# Patient Record
Sex: Male | Born: 1960
Health system: Southern US, Community
[De-identification: ages and names within clinical notes are randomized; demographics above are authoritative.]

## PROBLEM LIST (undated history)

## (undated) DIAGNOSIS — E785 Hyperlipidemia, unspecified: Secondary | ICD-10-CM

## (undated) DIAGNOSIS — K635 Polyp of colon: Secondary | ICD-10-CM

## (undated) DIAGNOSIS — I714 Abdominal aortic aneurysm, without rupture, unspecified: Secondary | ICD-10-CM

## (undated) DIAGNOSIS — K760 Fatty (change of) liver, not elsewhere classified: Secondary | ICD-10-CM

## (undated) DIAGNOSIS — Z720 Tobacco use: Secondary | ICD-10-CM

## (undated) DIAGNOSIS — J42 Unspecified chronic bronchitis: Secondary | ICD-10-CM

## (undated) DIAGNOSIS — J449 Chronic obstructive pulmonary disease, unspecified: Secondary | ICD-10-CM

## (undated) DIAGNOSIS — I10 Essential (primary) hypertension: Secondary | ICD-10-CM

## (undated) DIAGNOSIS — I5032 Chronic diastolic (congestive) heart failure: Secondary | ICD-10-CM

## (undated) DIAGNOSIS — K219 Gastro-esophageal reflux disease without esophagitis: Secondary | ICD-10-CM

## (undated) DIAGNOSIS — I209 Angina pectoris, unspecified: Secondary | ICD-10-CM

## (undated) DIAGNOSIS — I2510 Atherosclerotic heart disease of native coronary artery without angina pectoris: Secondary | ICD-10-CM

## (undated) DIAGNOSIS — K209 Esophagitis, unspecified without bleeding: Secondary | ICD-10-CM

## (undated) DIAGNOSIS — D126 Benign neoplasm of colon, unspecified: Secondary | ICD-10-CM

## (undated) DIAGNOSIS — G4730 Sleep apnea, unspecified: Secondary | ICD-10-CM

## (undated) DIAGNOSIS — K449 Diaphragmatic hernia without obstruction or gangrene: Secondary | ICD-10-CM

## (undated) DIAGNOSIS — F419 Anxiety disorder, unspecified: Secondary | ICD-10-CM

## (undated) DIAGNOSIS — F1490 Cocaine use, unspecified, uncomplicated: Secondary | ICD-10-CM

## (undated) DIAGNOSIS — I5030 Unspecified diastolic (congestive) heart failure: Secondary | ICD-10-CM

## (undated) DIAGNOSIS — I219 Acute myocardial infarction, unspecified: Secondary | ICD-10-CM

## (undated) DIAGNOSIS — K859 Acute pancreatitis without necrosis or infection, unspecified: Secondary | ICD-10-CM

## (undated) DIAGNOSIS — R269 Unspecified abnormalities of gait and mobility: Secondary | ICD-10-CM

## (undated) DIAGNOSIS — F32A Depression, unspecified: Secondary | ICD-10-CM

## (undated) DIAGNOSIS — Z9289 Personal history of other medical treatment: Secondary | ICD-10-CM

## (undated) DIAGNOSIS — D509 Iron deficiency anemia, unspecified: Secondary | ICD-10-CM

## (undated) DIAGNOSIS — I739 Peripheral vascular disease, unspecified: Secondary | ICD-10-CM

## (undated) DIAGNOSIS — F329 Major depressive disorder, single episode, unspecified: Secondary | ICD-10-CM

## (undated) DIAGNOSIS — T8182XA Emphysema (subcutaneous) resulting from a procedure, initial encounter: Secondary | ICD-10-CM

## (undated) DIAGNOSIS — F1010 Alcohol abuse, uncomplicated: Secondary | ICD-10-CM

## (undated) DIAGNOSIS — I639 Cerebral infarction, unspecified: Secondary | ICD-10-CM

## (undated) DIAGNOSIS — H53461 Homonymous bilateral field defects, right side: Secondary | ICD-10-CM

## (undated) HISTORY — DX: Personal history of other medical treatment: Z92.89

## (undated) HISTORY — DX: Abdominal aortic aneurysm, without rupture, unspecified: I71.40

## (undated) HISTORY — DX: Fatty (change of) liver, not elsewhere classified: K76.0

## (undated) HISTORY — DX: Essential (primary) hypertension: I10

## (undated) HISTORY — PX: CORONARY ARTERY BYPASS GRAFT: SHX141

## (undated) HISTORY — PX: CORONARY ANGIOPLASTY: SHX604

## (undated) HISTORY — DX: Sleep apnea, unspecified: G47.30

## (undated) HISTORY — DX: Cocaine use, unspecified, uncomplicated: F14.90

## (undated) HISTORY — DX: Unspecified diastolic (congestive) heart failure: I50.30

## (undated) HISTORY — DX: Unspecified abnormalities of gait and mobility: R26.9

## (undated) HISTORY — DX: Acute pancreatitis without necrosis or infection, unspecified: K85.90

## (undated) HISTORY — PX: COLONOSCOPY: SHX174

## (undated) HISTORY — DX: Gastro-esophageal reflux disease without esophagitis: K21.9

## (undated) HISTORY — DX: Benign neoplasm of colon, unspecified: D12.6

## (undated) HISTORY — DX: Diaphragmatic hernia without obstruction or gangrene: K44.9

## (undated) HISTORY — DX: Acute myocardial infarction, unspecified: I21.9

## (undated) HISTORY — DX: Hyperlipidemia, unspecified: E78.5

---

## 2000-08-10 DIAGNOSIS — I2510 Atherosclerotic heart disease of native coronary artery without angina pectoris: Secondary | ICD-10-CM

## 2000-12-07 ENCOUNTER — Inpatient Hospital Stay (HOSPITAL_COMMUNITY): Admission: EM | Admit: 2000-12-07 | Discharge: 2000-12-10 | Payer: Self-pay | Admitting: Emergency Medicine

## 2000-12-07 DIAGNOSIS — I219 Acute myocardial infarction, unspecified: Secondary | ICD-10-CM

## 2000-12-07 HISTORY — DX: Acute myocardial infarction, unspecified: I21.9

## 2000-12-09 ENCOUNTER — Encounter: Payer: Self-pay | Admitting: Cardiology

## 2001-11-14 ENCOUNTER — Encounter: Payer: Self-pay | Admitting: Emergency Medicine

## 2001-11-14 ENCOUNTER — Inpatient Hospital Stay (HOSPITAL_COMMUNITY): Admission: EM | Admit: 2001-11-14 | Discharge: 2001-11-16 | Payer: Self-pay | Admitting: Emergency Medicine

## 2001-12-25 ENCOUNTER — Emergency Department (HOSPITAL_COMMUNITY): Admission: EM | Admit: 2001-12-25 | Discharge: 2001-12-25 | Payer: Self-pay | Admitting: Emergency Medicine

## 2001-12-25 ENCOUNTER — Encounter: Payer: Self-pay | Admitting: Emergency Medicine

## 2006-11-21 ENCOUNTER — Inpatient Hospital Stay (HOSPITAL_COMMUNITY): Admission: EM | Admit: 2006-11-21 | Discharge: 2006-11-24 | Payer: Self-pay | Admitting: Emergency Medicine

## 2006-12-04 ENCOUNTER — Ambulatory Visit (HOSPITAL_COMMUNITY): Admission: RE | Admit: 2006-12-04 | Discharge: 2006-12-04 | Payer: Self-pay | Admitting: Cardiology

## 2006-12-04 ENCOUNTER — Inpatient Hospital Stay (HOSPITAL_COMMUNITY): Admission: EM | Admit: 2006-12-04 | Discharge: 2006-12-06 | Payer: Self-pay | Admitting: Emergency Medicine

## 2007-12-04 ENCOUNTER — Encounter (INDEPENDENT_AMBULATORY_CARE_PROVIDER_SITE_OTHER): Payer: Self-pay | Admitting: Nurse Practitioner

## 2008-07-26 ENCOUNTER — Ambulatory Visit: Payer: Self-pay | Admitting: Nurse Practitioner

## 2008-07-26 DIAGNOSIS — E785 Hyperlipidemia, unspecified: Secondary | ICD-10-CM

## 2008-07-26 DIAGNOSIS — I10 Essential (primary) hypertension: Secondary | ICD-10-CM | POA: Insufficient documentation

## 2008-07-26 DIAGNOSIS — I2121 ST elevation (STEMI) myocardial infarction involving left circumflex coronary artery: Secondary | ICD-10-CM

## 2008-07-26 LAB — CONVERTED CEMR LAB
Cholesterol, target level: 200 mg/dL
LDL Goal: 70 mg/dL

## 2008-07-27 DIAGNOSIS — F17210 Nicotine dependence, cigarettes, uncomplicated: Secondary | ICD-10-CM | POA: Insufficient documentation

## 2008-07-27 DIAGNOSIS — F17200 Nicotine dependence, unspecified, uncomplicated: Secondary | ICD-10-CM

## 2008-07-27 LAB — CONVERTED CEMR LAB
Albumin: 4.5 g/dL (ref 3.5–5.2)
Alkaline Phosphatase: 76 units/L (ref 39–117)
BUN: 11 mg/dL (ref 6–23)
Basophils Relative: 0 % (ref 0–1)
CO2: 20 meq/L (ref 19–32)
Calcium: 9.3 mg/dL (ref 8.4–10.5)
Chloride: 102 meq/L (ref 96–112)
Creatinine, Ser: 0.8 mg/dL (ref 0.40–1.50)
Glucose, Bld: 111 mg/dL — ABNORMAL HIGH (ref 70–99)
HDL: 60 mg/dL (ref >39)
LDL Cholesterol: 157 mg/dL — ABNORMAL HIGH (ref 0–99)
MCV: 100.9 fL — ABNORMAL HIGH (ref 78.0–100.0)
Monocytes Relative: 10 % (ref 3–12)
Potassium: 4.1 meq/L (ref 3.5–5.3)
Total CHOL/HDL Ratio: 4.2
VLDL: 35 mg/dL (ref 0–40)

## 2008-07-31 ENCOUNTER — Encounter (INDEPENDENT_AMBULATORY_CARE_PROVIDER_SITE_OTHER): Payer: Self-pay | Admitting: Nurse Practitioner

## 2008-08-06 ENCOUNTER — Encounter (INDEPENDENT_AMBULATORY_CARE_PROVIDER_SITE_OTHER): Payer: Self-pay | Admitting: Nurse Practitioner

## 2008-08-06 DIAGNOSIS — R269 Unspecified abnormalities of gait and mobility: Secondary | ICD-10-CM

## 2008-08-07 ENCOUNTER — Encounter (INDEPENDENT_AMBULATORY_CARE_PROVIDER_SITE_OTHER): Payer: Self-pay | Admitting: Nurse Practitioner

## 2008-08-10 DIAGNOSIS — D509 Iron deficiency anemia, unspecified: Secondary | ICD-10-CM

## 2008-08-10 HISTORY — DX: Iron deficiency anemia, unspecified: D50.9

## 2008-08-13 ENCOUNTER — Encounter (INDEPENDENT_AMBULATORY_CARE_PROVIDER_SITE_OTHER): Payer: Self-pay | Admitting: Nurse Practitioner

## 2008-10-08 DIAGNOSIS — T8182XA Emphysema (subcutaneous) resulting from a procedure, initial encounter: Secondary | ICD-10-CM

## 2008-10-08 HISTORY — DX: Emphysema (subcutaneous) resulting from a procedure, initial encounter: T81.82XA

## 2008-11-07 ENCOUNTER — Encounter: Payer: Self-pay | Admitting: Cardiothoracic Surgery

## 2008-11-07 ENCOUNTER — Inpatient Hospital Stay (HOSPITAL_COMMUNITY): Admission: EM | Admit: 2008-11-07 | Discharge: 2008-11-11 | Payer: Self-pay | Admitting: Emergency Medicine

## 2008-11-07 ENCOUNTER — Ambulatory Visit: Payer: Self-pay | Admitting: Cardiovascular Disease

## 2008-11-07 ENCOUNTER — Ambulatory Visit: Payer: Self-pay | Admitting: Cardiothoracic Surgery

## 2008-11-07 HISTORY — PX: CARDIAC CATHETERIZATION: SHX172

## 2008-11-13 ENCOUNTER — Encounter: Payer: Self-pay | Admitting: Cardiovascular Disease

## 2008-12-06 ENCOUNTER — Encounter: Payer: Self-pay | Admitting: Cardiovascular Disease

## 2008-12-06 ENCOUNTER — Ambulatory Visit: Payer: Self-pay | Admitting: Cardiothoracic Surgery

## 2008-12-06 ENCOUNTER — Encounter: Admission: RE | Admit: 2008-12-06 | Discharge: 2008-12-06 | Payer: Self-pay | Admitting: Cardiothoracic Surgery

## 2008-12-25 ENCOUNTER — Telehealth: Payer: Self-pay | Admitting: Cardiovascular Disease

## 2009-01-21 ENCOUNTER — Encounter: Payer: Self-pay | Admitting: Cardiovascular Disease

## 2009-02-04 ENCOUNTER — Encounter: Payer: Self-pay | Admitting: Cardiovascular Disease

## 2009-02-28 ENCOUNTER — Encounter (INDEPENDENT_AMBULATORY_CARE_PROVIDER_SITE_OTHER): Payer: Self-pay | Admitting: *Deleted

## 2009-04-19 ENCOUNTER — Telehealth: Payer: Self-pay | Admitting: Cardiovascular Disease

## 2009-06-18 ENCOUNTER — Telehealth: Payer: Self-pay | Admitting: Cardiovascular Disease

## 2009-06-23 ENCOUNTER — Inpatient Hospital Stay (HOSPITAL_COMMUNITY): Admission: EM | Admit: 2009-06-23 | Discharge: 2009-06-24 | Payer: Self-pay | Admitting: Emergency Medicine

## 2009-06-23 ENCOUNTER — Ambulatory Visit: Payer: Self-pay | Admitting: Cardiology

## 2009-06-23 LAB — CONVERTED CEMR LAB
LDL Cholesterol: 119 mg/dL
Total CHOL/HDL Ratio: 4.1
Triglycerides: 55 mg/dL
VLDL: 11 mg/dL

## 2009-06-24 ENCOUNTER — Telehealth (INDEPENDENT_AMBULATORY_CARE_PROVIDER_SITE_OTHER): Payer: Self-pay | Admitting: *Deleted

## 2009-07-08 ENCOUNTER — Telehealth: Payer: Self-pay | Admitting: Cardiovascular Disease

## 2009-07-09 ENCOUNTER — Encounter: Payer: Self-pay | Admitting: Cardiovascular Disease

## 2009-07-16 ENCOUNTER — Ambulatory Visit: Payer: Self-pay | Admitting: Cardiovascular Disease

## 2009-07-17 ENCOUNTER — Telehealth (INDEPENDENT_AMBULATORY_CARE_PROVIDER_SITE_OTHER): Payer: Self-pay | Admitting: *Deleted

## 2009-09-02 ENCOUNTER — Telehealth: Payer: Self-pay | Admitting: Cardiovascular Disease

## 2009-09-02 IMAGING — CR DG CHEST 2V
2 series · 2 of 2 positions shown · non-contrast
Comparison: 11/10/2008.

CLINICAL DATA: Status post CABG.  Chest pain.

CHEST - 2 VIEW

[view not recorded (1 of 2)]
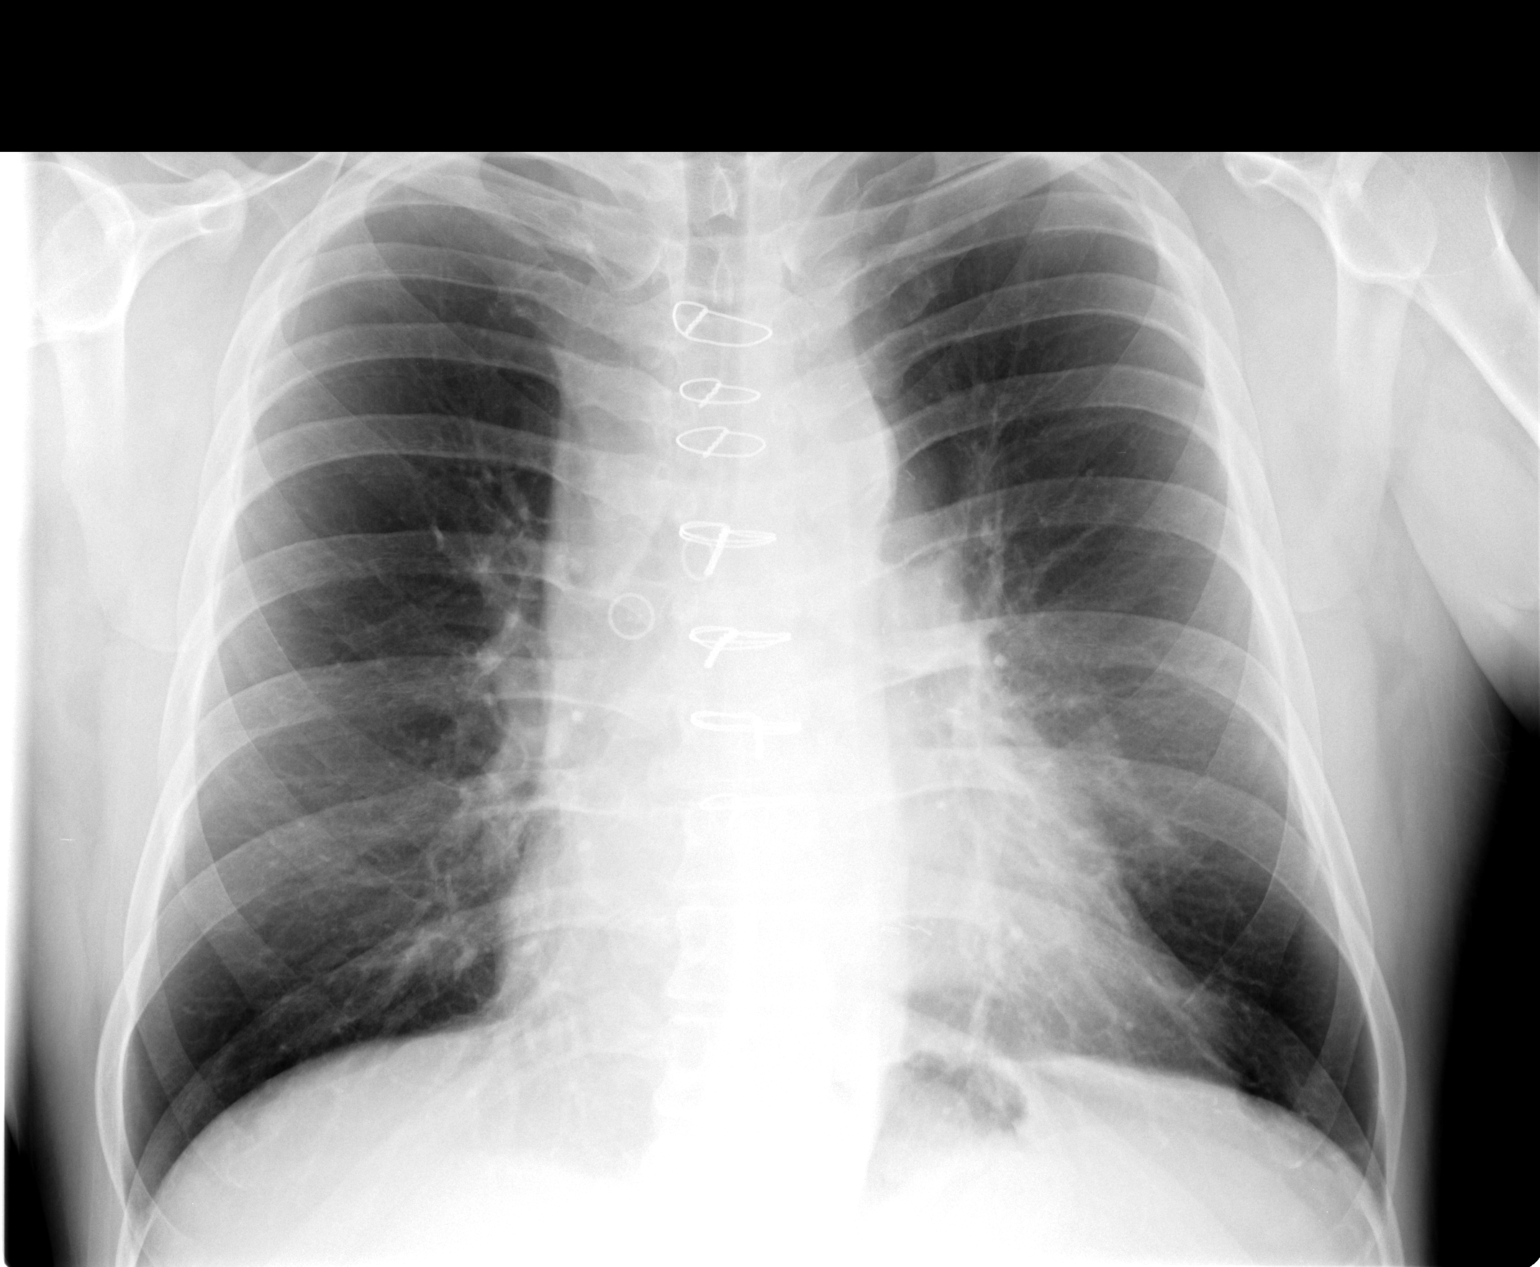

[view not recorded (2 of 2)]
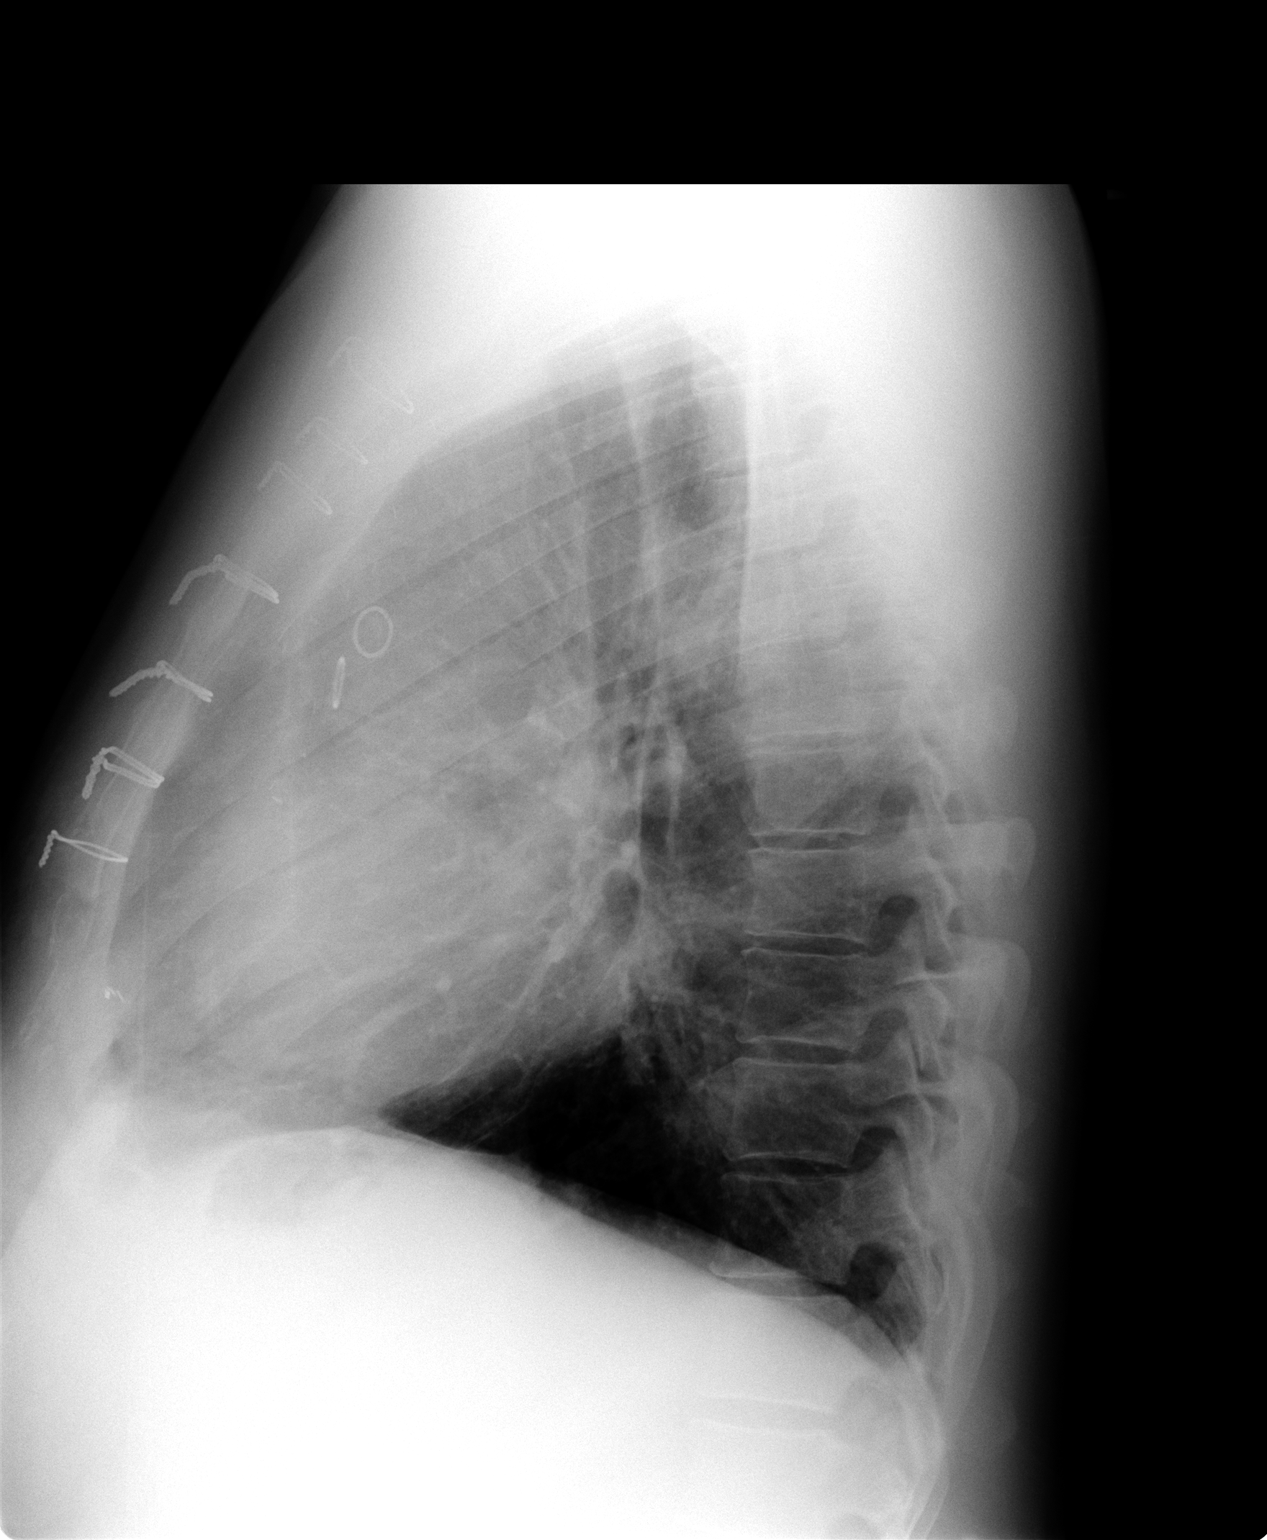

[2 of 2 positions shown; findings below may reference images not displayed]

FINDINGS: The patient is status post CABG.  Previously seen
bilateral pleural effusions and associated atelectasis have
resolved.  Mild perihilar hazy opacities are seen bilaterally.  No
discrete airspace disease is present.  The visualized soft tissues
and bony thorax are unremarkable.  Mild hyperinflation suggests an
element of COPD.
IMPRESSION: 1.  Interval resolution of bilateral pleural effusions and basilar
atelectasis.
2.  Changes of COPD.
3.  Bilateral perihilar indistinct opacities may be chronic.  A
viral process is not excluded.

## 2009-10-28 ENCOUNTER — Telehealth: Payer: Self-pay | Admitting: Cardiovascular Disease

## 2009-11-04 ENCOUNTER — Ambulatory Visit: Payer: Self-pay | Admitting: Cardiovascular Disease

## 2009-11-07 LAB — CONVERTED CEMR LAB
ALT: 18 units/L (ref 0–53)
AST: 19 units/L (ref 0–37)
Albumin: 3.7 g/dL (ref 3.5–5.2)
Alkaline Phosphatase: 64 units/L (ref 39–117)
Total Bilirubin: 0.7 mg/dL (ref 0.3–1.2)
Total CHOL/HDL Ratio: 4

## 2009-11-11 ENCOUNTER — Ambulatory Visit: Payer: Self-pay | Admitting: Cardiovascular Disease

## 2010-01-23 ENCOUNTER — Telehealth: Payer: Self-pay | Admitting: Cardiovascular Disease

## 2010-01-24 ENCOUNTER — Telehealth: Payer: Self-pay | Admitting: Cardiovascular Disease

## 2010-02-04 ENCOUNTER — Encounter: Payer: Self-pay | Admitting: Cardiovascular Disease

## 2010-02-18 ENCOUNTER — Telehealth: Payer: Self-pay | Admitting: Cardiovascular Disease

## 2010-02-26 ENCOUNTER — Telehealth: Payer: Self-pay | Admitting: Cardiovascular Disease

## 2010-02-28 ENCOUNTER — Telehealth: Payer: Self-pay | Admitting: Cardiovascular Disease

## 2010-09-09 NOTE — Progress Notes (Signed)
Summary: RE RX FROM YESTERDAY  Phone Note Call from Patient Call back at Home Phone [phone]   Caller: Patient [phone] Reason for Call: Talk to Nurse Summary of Call: CALLED PHARMACY AND THE MED THAT HE WAS PRESCRIBED YESTERDAY IS ABOUT $200, OUT OF WORK AND CAN NOT AFFORD IT, CAN HE GET SOMETHING CHEAPER OR WILL THE DRUG COMPANY OFFER A FREE RX LIKE WITH HIS PLAVIX? Initial call taken by: Glynda Jaeger,  January 24, 2010 2:53 PM  Follow-up for Phone Call        Phone Call Completed SPOKE WITH PT  PICKED UP 1 OR 2 VIAGRA TABLETS  UNABLE TO GET ALL 10  TO EXPENSIVE PT HAS NO INCOME INSURANCE. INSTRUCTED WILL APPLY WITH [employer] FOR ASSISTANCE VERBALIZED UNDERSTANDING. Follow-up by: Scherrie Bateman, LPN,  January 24, 2010 4:37 PM  Additional Follow-up for Phone Call Additional follow up Details #1::        Pt. needs to complete and sign application. Left message to call back. Dossie Arbour, RN, BSN  January 30, 2010 12:20 PM Spoke with pt. He would like to come to office to fill out paperwork. I told him it would be at front desk Dossie Arbour, RN, BSN  January 30, 2010 12:44 PM  request call back, Migdalia Dk  January 30, 2010 3:24 PM  Spoke with pt and told him I have his completed application and I would have Dr.McAlhany review when he is back in office next week and then submit application Dossie Arbour, RN, BSN  January 30, 2010 3:47 PM [employer] has new form since one pt completed. Called pt to let him know we would need new form filled out. Left message to call back. Dossie Arbour, RN, BSN  January 31, 2010 1:17 PM Spoke with pt. He will come to office on Monday to complete. Dossie Arbour, RN, BSN  January 31, 2010 1:35 PM I called to let pt know all paperwork has been completed and will be faxed. Left message to call back. Dossie Arbour, RN, BSN  February 05, 2010 9:30 AM Pt. notified paperwork will be mailed today Additional Follow-up by: Dossie Arbour, RN, BSN,   February 06, 2010 11:08 AM

## 2010-09-09 NOTE — Progress Notes (Signed)
Summary: HAS QUESTIONS  Phone Note Call from Patient Call back at Home Phone [phone]   Caller: Patient [phone] Reason for Call: Talk to Nurse Summary of Call: PT HAS QUESTIONS-WOULD NOT ELABORATE Initial call taken by: Glynda Jaeger,  January 23, 2010 3:18 PM  Follow-up for Phone Call        PT CALLED WITH C/O OF ERECTILE DYSFUNCTION  IS REQUESTING PRESCRIPTION TO TAKE AS NEEDED.WHAT DO YOU RECCOMMEND? Follow-up by: Scherrie Bateman, LPN,  January 23, 2010 5:40 PM  Additional Follow-up for Phone Call Additional follow up Details #1::        Can we call him in Viagra 50 mg as needed, with 10 pills in script and 2 refills. Do not take if using sublingual nitro. thanks, cdm Additional Follow-up by: Verne Carrow, MD,  January 24, 2010 12:07 PM    Additional Follow-up for Phone Call Additional follow up Details #2::    PT AWARE. AND MED SENT TO [facility]. Follow-up by: Scherrie Bateman, LPN,  January 24, 2010 12:34 PM  New/Updated Medications: VIAGRA 50 MG TABS (SILDENAFIL CITRATE) AS NEEDED Prescriptions: VIAGRA 50 MG TABS (SILDENAFIL CITRATE) AS NEEDED  #10 x 2   Entered by:   Scherrie Bateman, LPN   Authorized by:   Verne Carrow, MD   Signed by:   Scherrie Bateman, LPN on 30/86/5784   Method used:   Electronically to        [facility]* (retail)       [address] [place], [place]  [address]       Ph: [phone] or [phone]       Fax: [phone]   RxID:   [id number]

## 2010-09-09 NOTE — Miscellaneous (Signed)
Summary: Orders Update  Clinical Lists Changes  Orders: Added new Test order of TLB-Lipid Panel (80061-LIPID) - Signed Added new Test order of TLB-Hepatic/Liver Function Pnl (80076-HEPATIC) - Signed 

## 2010-09-09 NOTE — Progress Notes (Signed)
Summary: Designer, multimedia Call   Call placed to: Patient Summary of Call: When I spoke with pt earlier today he was asking on status of application for Viagra assistance. I spoke with [employer] rep today and pt has been approved for assistance program. He has been approved until February 15, 2011 and medication will be sent to Dr. Gibson Ramp office. Can be refilled every 90 days. Pt will need to call office when he needs reorder.  Medicine should arrive in office next week.  I called and gave pt this information. I told him we would call him when medicine arrived or if he would like he could call towards the end of next week to see if it had arrived in office. Initial call taken by: Dossie Arbour, RN, BSN,  February 28, 2010 5:13 PM  Follow-up for Phone Call        Pt. notified Viagra - 30 tablets-90 day supply is in office and will be at front desk for him to pick up. He is aware how to take and that he should not take nitrates with this medication. He is aware it can be refilled in 90 days and he will call us when refill needed. Dossie Arbour, RN, BSN  March 11, 2010 11:36 AM

## 2010-09-09 NOTE — Progress Notes (Signed)
Summary: rx pravastatin   Phone Note Refill Request Message from:  Patient on September 02, 2009 4:27 PM  Refills Requested: Medication #1:  PRAVASTATIN SODIUM 40 MG TABS 2 tab once daily Send to [facility] [phone]  Initial call taken by: Judie Grieve,  September 02, 2009 4:28 PM    Prescriptions: PRAVASTATIN SODIUM 40 MG TABS (PRAVASTATIN SODIUM) 2 tab once daily  #60 x 11   Entered by:   Danielle Rankin, CMA   Authorized by:   Verne Carrow, MD   Signed by:   Danielle Rankin, CMA on 09/03/2009   Method used:   Electronically to        [facility]* (retail)       [address] [place], [place]  [address]       Ph: [phone] or [phone]       Fax: [phone]   RxID:   [id number]

## 2010-09-09 NOTE — Progress Notes (Signed)
Summary: wants to talk with pat-knows it will be friday  Phone Note Call from Patient   Caller: Patient Reason for Call: Talk to Nurse Summary of Call: pt calling to talk with pat-I told pt his plavix is here at the front desk-pt wants to talk with pat re her call with his son, wanted to apologize -pls call [phone] Initial call taken by: Glynda Jaeger,  February 26, 2010 8:34 AM  Follow-up for Phone Call        Spoke with pt. He will pick up Plavix today. Follow-up by: Dossie Arbour, RN, BSN,  February 28, 2010 12:47 PM

## 2010-09-09 NOTE — Progress Notes (Signed)
Summary: Pt need his Plavix ordered.  Phone Note Call from Patient Call back at Home Phone [phone]   Caller: Patient Summary of Call: Pt needs his Plavix ordered from [facility] Initial call taken by: Judie Grieve,  October 28, 2009 11:44 AM  Follow-up for Phone Call        CMA ordered Plavix through [facility] program.Called patient and left message on machine at pt's home phone rx called [facility] today aqnd to please allow 7-10 business for plavix to be delivered here to our office. We will call pt when plavix is in. Danielle Rankin, CMA  October 28, 2009 12:31 PM  Pt. notified Plavix has arrived in office. He will come in to pick up. Plavix left at front desk Follow-up by: Dossie Arbour, RN, BSN,  November 01, 2009 12:28 PM

## 2010-09-09 NOTE — Assessment & Plan Note (Signed)
Summary: 4 month follow up/414.04/pla   Visit Type:  Follow-up Primary Provider:  No PCP  CC:  pt states he feel pretty good today.Marland Kitchenoffers no complainmts today.  History of Present Illness: 50 year old malewith history of HTN, hyperlipidemia and CAD  presenting for cardiac follow up. He had a NSTEMI in November 2010  at which time he had bare metal stenting of the totally occluded saphenous vein graft to the obtuse marginal artery.  Prior to that he had a STEMI in March2010  for which he had a 3 vessel CABG.  He has had no chest pain or dyspnea. His energy level has been better than before his surgery.  Continues to smoke, currently 1ppd. He stopped smoking for one week after his last vist.  No illicit drug use.  He has been active and working around the house without any dyspnea or chest pain. He has been taking all of his medications. He is still out of work.      Current Medications (verified): 1)  Pravastatin Sodium 40 Mg Tabs (Pravastatin Sodium) .... 2 Tab Once Daily 2)  Aspirin 81 Mg Tbec (Aspirin) .... Take One Tablet By Mouth Daily 3)  Lisinopril 2.5 Mg Tabs (Lisinopril) .... One Tab Once Daily 4)  Carvedilol 12.5 Mg Tabs (Carvedilol) .[name].. 1 Tab Two Times A Day 5)  Plavix 75 Mg Tabs (Clopidogrel Bisulfate) .[name].. 1 Tab Once Daily  Allergies (verified): No Known Drug Allergies  Past History:  Past Medical History: Reviewed history from 07/16/2009 and no changes required. MYOCARDIAL INFARCTION (ICD-410.90) CAD (ICD-414.00) (status post percutaneous transluminal coronary angioplasty with stent to the left anterior   descending and circumflex in past). CABG 3 V 4/10. Bare metal stent SVG to OM 11/10. 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  Review of Systems  The patient denies fatigue, malaise, fever, weight gain/loss, vision loss, decreased hearing, hoarseness, chest pain, palpitations, shortness of breath, prolonged cough, wheezing, sleep apnea, coughing up blood, abdominal pain, blood in stool, nausea, vomiting, diarrhea, heartburn, incontinence, blood in urine, muscle weakness, joint pain, leg swelling, rash, skin lesions, headache, fainting, dizziness, depression, anxiety, enlarged lymph nodes, easy bruising or bleeding, and environmental allergies.    Vital Signs:  Patient profile:   50 year old male Height:      69 inches Weight:      197 pounds BMI:     29.20 Pulse rate:   80 / minute Pulse rhythm:   regular BP sitting:   132 / 82  (left arm) Cuff size:   large  Vitals Entered By: Danielle Rankin, CMA (November 11, 2009 2:04 PM)  Physical Exam  General:  General: Well developed, well nourished, NAD Musculoskeletal: Muscle strength 5/5 all ext Psychiatric: Mood and affect normal Neck: No JVD, no carotid bruits, no thyromegaly, no lymphadenopathy. Lungs:Clear bilaterally, no wheezes, rhonci, crackles CV: RRR no murmurs, gallops rubs Abdomen: soft, NT, ND, BS present Extremities: No edema, pulses 2+.    Impression & Recommendations:  Problem # 1:  CAD (ICD-414.00) Stable. No chest pain. Continue current medical therapy.   His updated medication list for this problem includes:    Aspirin 81 Mg Tbec (Aspirin) .[name]... Take one tablet  by mouth daily    Lisinopril 2.5 Mg Tabs (Lisinopril) ..... One tab once daily    Carvedilol 12.5 Mg Tabs (Carvedilol) .[name]... 1 tab two times a day    Plavix 75 Mg Tabs (Clopidogrel bisulfate) .[name]... 1 tab once daily  Problem # 2:  HYPERTENSION, BENIGN ESSENTIAL (ICD-401.1) Well controlled on current therapy.   His updated medication  list for this problem includes:    Aspirin 81 Mg Tbec (Aspirin) .[name]... Take one tablet by mouth daily    Lisinopril 2.5 Mg Tabs (Lisinopril) ..... One tab once daily    Carvedilol 12.5 Mg Tabs (Carvedilol) .[name]... 1 tab two times a day  Problem # 3:  DYSLIPIDEMIA (ICD-272.4) LDL is not at goal of 70. Most recent LDL one week ago is  105. I have discussed changing him to another statin but he is unable to afford another medication. Will continue Pravastatin. He will attempt to alter his diet to exclude red meats and add more fish. Repeat in one year.  His updated medication list for this problem includes:    Pravastatin Sodium 40 Mg Tabs (Pravastatin sodium) .[name]... 2 tab once daily  Problem # 4:  TOBACCO ABUSE (ICD-305.1) Complete cessation encouraged.   Patient Instructions: 1)  Your physician recommends that you schedule a follow-up appointment in: 6 months 2)  Your physician recommends that you continue on your current medications as directed. Please refer to the Current Medication list given to you today.

## 2010-09-09 NOTE — Letter (Signed)
Summary: Generic Letter  Architectural technologist, Main Office  [address]   [place], [place] [address]   Phone: [phone]  Fax: [phone]        February 04, 2010 MRN: [id number]    [name] [address] [place], [place]  [address]    To Whom It May Concern,  [name] has no source of income currently. He does not have a proof of annual income in the form of tax documents. He is applying for the Viagra assistance program. He has organic erectile dysfunction.     Sincerely,  Verne Carrow, MD  This letter has been electronically signed by your physician.

## 2010-09-09 NOTE — Progress Notes (Signed)
Summary: refill of plavix  Phone Note Call from Patient   Caller: Patient Reason for Call: Talk to Nurse Summary of Call: refill of plavix-to pick up here at office-pls call when ready [phone] Initial call taken by: Glynda Jaeger,  February 18, 2010 1:26 PM  Follow-up for Phone Call        I called [facility] and ordered Plavix.  Will call pt when it arrives in office Dossie Arbour, RN, BSN  February 18, 2010 5:06 PM Plavix in office. I attempted to call pt at above number and person answering said he was not there and hung up. I was unable to leave message.  I called back but there was no answer.  Will leave Plavix at front desk and continue to try and reach pt. Lot [id number]. Mar 2014 Dossie Arbour, RN, BSN  February 25, 2010 6:37 PM Pt. aware Plavix is here. He wil come in to pick up. Follow-up by: Dossie Arbour, RN, BSN,  February 28, 2010 12:48 PM

## 2010-09-09 NOTE — Letter (Signed)
Summary: Arboriculturist   Imported By: Marylou Mccoy 03/07/2010 11:53:44  _____________________________________________________________________  External Attachment:    Type:   Image     Comment:   External Document

## 2010-11-12 LAB — TSH: TSH: 2.29 u[IU]/mL (ref 0.350–4.500)

## 2010-11-12 LAB — DIFFERENTIAL
Basophils Relative: 0 % (ref 0–1)
Eosinophils Absolute: 0.2 10*3/uL (ref 0.0–0.7)
Eosinophils Relative: 2 % (ref 0–5)
Monocytes Absolute: 0.9 10*3/uL (ref 0.1–1.0)
Monocytes Relative: 11 % (ref 3–12)
Neutrophils Relative %: 73 % (ref 43–77)

## 2010-11-12 LAB — BASIC METABOLIC PANEL
BUN: 11 mg/dL (ref 6–23)
Calcium: 8.2 mg/dL — ABNORMAL LOW (ref 8.4–10.5)
Creatinine, Ser: 0.78 mg/dL (ref 0.4–1.5)
GFR calc Af Amer: >60 mL/min (ref >60)
GFR calc non Af Amer: >60 mL/min (ref >60)
GFR calc non Af Amer: >60 mL/min (ref >60)
Potassium: 4 mEq/L (ref 3.5–5.1)
Sodium: 130 mEq/L — ABNORMAL LOW (ref 135–145)

## 2010-11-12 LAB — CBC
HCT: 39.4 % (ref 39.0–52.0)
HCT: 40.3 % (ref 39.0–52.0)
HCT: 41.1 % (ref 39.0–52.0)
Hemoglobin: 14 g/dL (ref 13.0–17.0)
MCHC: 34.6 g/dL (ref 30.0–36.0)
MCV: 95.6 fL (ref 78.0–100.0)
MCV: 96.2 fL (ref 78.0–100.0)
Platelets: 211 10*3/uL (ref 150–400)
RBC: 4.13 MIL/uL — ABNORMAL LOW (ref 4.22–5.81)
RBC: 4.23 MIL/uL (ref 4.22–5.81)
RDW: 16.5 % — ABNORMAL HIGH (ref 11.5–15.5)
WBC: 8.9 10*3/uL (ref 4.0–10.5)

## 2010-11-12 LAB — LIPID PANEL
HDL: 42 mg/dL (ref >39)
VLDL: 11 mg/dL (ref 0–40)

## 2010-11-12 LAB — CARDIAC PANEL(CRET KIN+CKTOT+MB+TROPI)
CK, MB: 151.7 ng/mL — ABNORMAL HIGH (ref 0.3–4.0)
CK, MB: 94.6 ng/mL — ABNORMAL HIGH (ref 0.3–4.0)
Relative Index: 16 — ABNORMAL HIGH (ref 0.0–2.5)
Troponin I: 3.12 ng/mL (ref 0.00–0.06)

## 2010-11-12 LAB — POCT I-STAT, CHEM 8
Calcium, Ion: 1.07 mmol/L — ABNORMAL LOW (ref 1.12–1.32)
Creatinine, Ser: 0.8 mg/dL (ref 0.4–1.5)
Glucose, Bld: 118 mg/dL — ABNORMAL HIGH (ref 70–99)
HCT: 46 % (ref 39.0–52.0)
Hemoglobin: 15.6 g/dL (ref 13.0–17.0)
Potassium: 3.9 mEq/L (ref 3.5–5.1)
Sodium: 134 mEq/L — ABNORMAL LOW (ref 135–145)

## 2010-11-12 LAB — GLUCOSE, CAPILLARY

## 2010-11-12 LAB — POCT CARDIAC MARKERS: CKMB, poc: 39.3 ng/mL (ref 1.0–8.0)

## 2010-11-12 LAB — MAGNESIUM: Magnesium: 2 mg/dL (ref 1.5–2.5)

## 2010-11-19 LAB — MAGNESIUM
Magnesium: 2.1 mg/dL (ref 1.5–2.5)
Magnesium: 2.2 mg/dL (ref 1.5–2.5)

## 2010-11-19 LAB — POCT I-STAT, CHEM 8
HCT: 34 % — ABNORMAL LOW (ref 39.0–52.0)
Hemoglobin: 11.6 g/dL — ABNORMAL LOW (ref 13.0–17.0)
Potassium: 3.7 mEq/L (ref 3.5–5.1)
Sodium: 132 mEq/L — ABNORMAL LOW (ref 135–145)
TCO2: 26 mmol/L (ref 0–100)

## 2010-11-19 LAB — BASIC METABOLIC PANEL
BUN: 2 mg/dL — ABNORMAL LOW (ref 6–23)
BUN: 5 mg/dL — ABNORMAL LOW (ref 6–23)
BUN: 5 mg/dL — ABNORMAL LOW (ref 6–23)
BUN: 9 mg/dL (ref 6–23)
CO2: 26 mEq/L (ref 19–32)
CO2: 26 mEq/L (ref 19–32)
CO2: 27 mEq/L (ref 19–32)
CO2: 28 mEq/L (ref 19–32)
Calcium: 6.9 mg/dL — ABNORMAL LOW (ref 8.4–10.5)
Calcium: 7.2 mg/dL — ABNORMAL LOW (ref 8.4–10.5)
Calcium: 8 mg/dL — ABNORMAL LOW (ref 8.4–10.5)
Calcium: 8 mg/dL — ABNORMAL LOW (ref 8.4–10.5)
Chloride: 101 mEq/L (ref 96–112)
Chloride: 101 mEq/L (ref 96–112)
Chloride: 101 mEq/L (ref 96–112)
Chloride: 96 mEq/L (ref 96–112)
Creatinine, Ser: 0.71 mg/dL (ref 0.4–1.5)
Creatinine, Ser: 0.82 mg/dL (ref 0.4–1.5)
Creatinine, Ser: 0.84 mg/dL (ref 0.4–1.5)
Creatinine, Ser: 0.89 mg/dL (ref 0.4–1.5)
GFR calc Af Amer: >60 mL/min (ref >60)
GFR calc Af Amer: >60 mL/min (ref >60)
GFR calc Af Amer: >60 mL/min (ref >60)
GFR calc Af Amer: >60 mL/min (ref >60)
GFR calc non Af Amer: >60 mL/min (ref >60)
GFR calc non Af Amer: >60 mL/min (ref >60)
GFR calc non Af Amer: >60 mL/min (ref >60)
GFR calc non Af Amer: >60 mL/min (ref >60)
Glucose, Bld: 101 mg/dL — ABNORMAL HIGH (ref 70–99)
Glucose, Bld: 114 mg/dL — ABNORMAL HIGH (ref 70–99)
Glucose, Bld: 114 mg/dL — ABNORMAL HIGH (ref 70–99)
Glucose, Bld: 127 mg/dL — ABNORMAL HIGH (ref 70–99)
Potassium: 3.7 mEq/L (ref 3.5–5.1)
Potassium: 4 mEq/L (ref 3.5–5.1)
Potassium: 4 mEq/L (ref 3.5–5.1)
Potassium: 7.3 mEq/L (ref 3.5–5.1)
Sodium: 132 mEq/L — ABNORMAL LOW (ref 135–145)
Sodium: 133 mEq/L — ABNORMAL LOW (ref 135–145)
Sodium: 133 mEq/L — ABNORMAL LOW (ref 135–145)
Sodium: 133 mEq/L — ABNORMAL LOW (ref 135–145)

## 2010-11-19 LAB — CBC
HCT: 29 % — ABNORMAL LOW (ref 39.0–52.0)
HCT: 30.4 % — ABNORMAL LOW (ref 39.0–52.0)
HCT: 31.7 % — ABNORMAL LOW (ref 39.0–52.0)
HCT: 31.8 % — ABNORMAL LOW (ref 39.0–52.0)
Hemoglobin: 10.3 g/dL — ABNORMAL LOW (ref 13.0–17.0)
Hemoglobin: 11 g/dL — ABNORMAL LOW (ref 13.0–17.0)
Hemoglobin: 11 g/dL — ABNORMAL LOW (ref 13.0–17.0)
Hemoglobin: 11.2 g/dL — ABNORMAL LOW (ref 13.0–17.0)
MCHC: 34.5 g/dL (ref 30.0–36.0)
MCHC: 35.3 g/dL (ref 30.0–36.0)
MCHC: 35.5 g/dL (ref 30.0–36.0)
MCHC: 36.1 g/dL — ABNORMAL HIGH (ref 30.0–36.0)
MCV: 98.9 fL (ref 78.0–100.0)
MCV: 99.1 fL (ref 78.0–100.0)
MCV: 99.3 fL (ref 78.0–100.0)
MCV: 99.3 fL (ref 78.0–100.0)
Platelets: 116 10*3/uL — ABNORMAL LOW (ref 150–400)
Platelets: 121 10*3/uL — ABNORMAL LOW (ref 150–400)
Platelets: 122 10*3/uL — ABNORMAL LOW (ref 150–400)
Platelets: 124 10*3/uL — ABNORMAL LOW (ref 150–400)
RBC: 2.91 MIL/uL — ABNORMAL LOW (ref 4.22–5.81)
RBC: 3.07 MIL/uL — ABNORMAL LOW (ref 4.22–5.81)
RBC: 3.2 MIL/uL — ABNORMAL LOW (ref 4.22–5.81)
RBC: 3.2 MIL/uL — ABNORMAL LOW (ref 4.22–5.81)
RDW: 13.9 % (ref 11.5–15.5)
RDW: 13.9 % (ref 11.5–15.5)
RDW: 14.1 % (ref 11.5–15.5)
RDW: 14.5 % (ref 11.5–15.5)
WBC: 10.8 10*3/uL — ABNORMAL HIGH (ref 4.0–10.5)
WBC: 11.1 10*3/uL — ABNORMAL HIGH (ref 4.0–10.5)
WBC: 11.3 10*3/uL — ABNORMAL HIGH (ref 4.0–10.5)
WBC: 13.2 10*3/uL — ABNORMAL HIGH (ref 4.0–10.5)

## 2010-11-19 LAB — GLUCOSE, CAPILLARY
Glucose-Capillary: 112 mg/dL — ABNORMAL HIGH (ref 70–99)
Glucose-Capillary: 124 mg/dL — ABNORMAL HIGH (ref 70–99)
Glucose-Capillary: 136 mg/dL — ABNORMAL HIGH (ref 70–99)
Glucose-Capillary: 136 mg/dL — ABNORMAL HIGH (ref 70–99)

## 2010-11-19 LAB — CK TOTAL AND CKMB (NOT AT ARMC)
CK, MB: 99.2 ng/mL — ABNORMAL HIGH (ref 0.3–4.0)
Relative Index: 4.4 — ABNORMAL HIGH (ref 0.0–2.5)
Total CK: 2277 U/L — ABNORMAL HIGH (ref 7–232)

## 2010-11-19 LAB — CREATININE, SERUM
Creatinine, Ser: 0.76 mg/dL (ref 0.4–1.5)
GFR calc Af Amer: >60 mL/min (ref >60)
GFR calc non Af Amer: >60 mL/min (ref >60)

## 2010-11-20 LAB — COMPREHENSIVE METABOLIC PANEL
AST: 88 U/L — ABNORMAL HIGH (ref 0–37)
Albumin: 2.8 g/dL — ABNORMAL LOW (ref 3.5–5.2)
Chloride: 97 mEq/L (ref 96–112)
Creatinine, Ser: 0.81 mg/dL (ref 0.4–1.5)
GFR calc Af Amer: >60 mL/min (ref >60)
Potassium: 3.1 mEq/L — ABNORMAL LOW (ref 3.5–5.1)
Total Bilirubin: 0.8 mg/dL (ref 0.3–1.2)

## 2010-11-20 LAB — CBC
HCT: 31.1 % — ABNORMAL LOW (ref 39.0–52.0)
HCT: 45.9 % (ref 39.0–52.0)
Hemoglobin: 11 g/dL — ABNORMAL LOW (ref 13.0–17.0)
MCHC: 35.5 g/dL (ref 30.0–36.0)
MCV: 98.7 fL (ref 78.0–100.0)
MCV: 98.8 fL (ref 78.0–100.0)
Platelets: 134 10*3/uL — ABNORMAL LOW (ref 150–400)
Platelets: 204 10*3/uL (ref 150–400)
Platelets: 268 10*3/uL (ref 150–400)
RBC: 3.15 MIL/uL — ABNORMAL LOW (ref 4.22–5.81)
RDW: 13.7 % (ref 11.5–15.5)
WBC: 10.8 10*3/uL — ABNORMAL HIGH (ref 4.0–10.5)
WBC: 11.5 10*3/uL — ABNORMAL HIGH (ref 4.0–10.5)
WBC: 12.3 10*3/uL — ABNORMAL HIGH (ref 4.0–10.5)

## 2010-11-20 LAB — POCT I-STAT 4, (NA,K, GLUC, HGB,HCT)
Glucose, Bld: 109 mg/dL — ABNORMAL HIGH (ref 70–99)
Glucose, Bld: 114 mg/dL — ABNORMAL HIGH (ref 70–99)
Glucose, Bld: 115 mg/dL — ABNORMAL HIGH (ref 70–99)
Glucose, Bld: 116 mg/dL — ABNORMAL HIGH (ref 70–99)
Glucose, Bld: 118 mg/dL — ABNORMAL HIGH (ref 70–99)
Glucose, Bld: 121 mg/dL — ABNORMAL HIGH (ref 70–99)
HCT: 33 % — ABNORMAL LOW (ref 39.0–52.0)
HCT: 37 % — ABNORMAL LOW (ref 39.0–52.0)
HCT: 42 % (ref 39.0–52.0)
HCT: 44 % (ref 39.0–52.0)
Hemoglobin: 10.9 g/dL — ABNORMAL LOW (ref 13.0–17.0)
Hemoglobin: 11.2 g/dL — ABNORMAL LOW (ref 13.0–17.0)
Hemoglobin: 12.6 g/dL — ABNORMAL LOW (ref 13.0–17.0)
Hemoglobin: 14.3 g/dL (ref 13.0–17.0)
Hemoglobin: 9.9 g/dL — ABNORMAL LOW (ref 13.0–17.0)
Potassium: 3 mEq/L — ABNORMAL LOW (ref 3.5–5.1)
Potassium: 3 mEq/L — ABNORMAL LOW (ref 3.5–5.1)
Potassium: 3.1 mEq/L — ABNORMAL LOW (ref 3.5–5.1)
Potassium: 3.4 mEq/L — ABNORMAL LOW (ref 3.5–5.1)
Potassium: 4.1 mEq/L (ref 3.5–5.1)
Potassium: 4.3 mEq/L (ref 3.5–5.1)
Potassium: 5.1 mEq/L (ref 3.5–5.1)
Sodium: 131 mEq/L — ABNORMAL LOW (ref 135–145)
Sodium: 132 mEq/L — ABNORMAL LOW (ref 135–145)
Sodium: 135 mEq/L (ref 135–145)

## 2010-11-20 LAB — POCT I-STAT 3, ART BLOOD GAS (G3+)
Acid-base deficit: 1 mmol/L (ref 0.0–2.0)
Acid-base deficit: 5 mmol/L — ABNORMAL HIGH (ref 0.0–2.0)
Bicarbonate: 25.7 mEq/L — ABNORMAL HIGH (ref 20.0–24.0)
O2 Saturation: 97 %
Patient temperature: 36.5
TCO2: 28 mmol/L (ref 0–100)
pH, Arterial: 7.327 — ABNORMAL LOW (ref 7.350–7.450)
pH, Arterial: 7.39 (ref 7.350–7.450)
pH, Arterial: 7.401 (ref 7.350–7.450)
pO2, Arterial: 97 mmHg (ref 80.0–100.0)

## 2010-11-20 LAB — POCT I-STAT 3, VENOUS BLOOD GAS (G3P V)
Acid-base deficit: 3 mmol/L — ABNORMAL HIGH (ref 0.0–2.0)
pH, Ven: 7.314 — ABNORMAL HIGH (ref 7.250–7.300)
pO2, Ven: 52 mmHg — ABNORMAL HIGH (ref 30.0–45.0)

## 2010-11-20 LAB — LIPID PANEL
Cholesterol: 187 mg/dL (ref 0–200)
HDL: 40 mg/dL (ref >39)
LDL Cholesterol: 102 mg/dL — ABNORMAL HIGH (ref 0–99)
Total CHOL/HDL Ratio: 4.7 RATIO

## 2010-11-20 LAB — CREATININE, SERUM
Creatinine, Ser: 0.94 mg/dL (ref 0.4–1.5)
GFR calc Af Amer: >60 mL/min (ref >60)
GFR calc non Af Amer: >60 mL/min (ref >60)

## 2010-11-20 LAB — POCT I-STAT, CHEM 8
BUN: 6 mg/dL (ref 6–23)
Calcium, Ion: 1.02 mmol/L — ABNORMAL LOW (ref 1.12–1.32)
Chloride: 105 mEq/L (ref 96–112)
Creatinine, Ser: 1 mg/dL (ref 0.4–1.5)
Glucose, Bld: 124 mg/dL — ABNORMAL HIGH (ref 70–99)
HCT: 45 % (ref 39.0–52.0)

## 2010-11-20 LAB — GLUCOSE, CAPILLARY
Glucose-Capillary: 102 mg/dL — ABNORMAL HIGH (ref 70–99)
Glucose-Capillary: 75 mg/dL (ref 70–99)

## 2010-11-20 LAB — URINALYSIS, MICROSCOPIC ONLY
Bilirubin Urine: NEGATIVE
Hgb urine dipstick: NEGATIVE
Ketones, ur: NEGATIVE mg/dL
Protein, ur: NEGATIVE mg/dL
Urobilinogen, UA: 0.2 mg/dL (ref 0.0–1.0)

## 2010-11-20 LAB — PROTIME-INR
INR: 1.2 (ref 0.00–1.49)
INR: 1.3 (ref 0.00–1.49)
Prothrombin Time: 16.7 seconds — ABNORMAL HIGH (ref 11.6–15.2)

## 2010-11-20 LAB — DIFFERENTIAL
Basophils Absolute: 0.1 10*3/uL (ref 0.0–0.1)
Eosinophils Relative: 1 % (ref 0–5)
Lymphocytes Relative: 26 % (ref 12–46)
Lymphocytes Relative: 9 % — ABNORMAL LOW (ref 12–46)
Lymphs Abs: 2.8 10*3/uL (ref 0.7–4.0)
Monocytes Absolute: 0.8 10*3/uL (ref 0.1–1.0)
Monocytes Relative: 7 % (ref 3–12)
Neutrophils Relative %: 60 % (ref 43–77)

## 2010-11-20 LAB — RAPID URINE DRUG SCREEN, HOSP PERFORMED
Amphetamines: NOT DETECTED
Barbiturates: NOT DETECTED
Benzodiazepines: POSITIVE — AB
Cocaine: POSITIVE — AB
Opiates: NOT DETECTED

## 2010-11-20 LAB — CARDIAC PANEL(CRET KIN+CKTOT+MB+TROPI)
CK, MB: 245.5 ng/mL — ABNORMAL HIGH (ref 0.3–4.0)
Relative Index: 9.3 — ABNORMAL HIGH (ref 0.0–2.5)
Total CK: 2642 U/L — ABNORMAL HIGH (ref 7–232)
Troponin I: 27.69 ng/mL (ref 0.00–0.06)

## 2010-11-20 LAB — MAGNESIUM: Magnesium: 2.6 mg/dL — ABNORMAL HIGH (ref 1.5–2.5)

## 2010-11-20 LAB — APTT
aPTT: 35 seconds (ref 24–37)
aPTT: 58 seconds — ABNORMAL HIGH (ref 24–37)

## 2010-11-20 LAB — HEMOGLOBIN AND HEMATOCRIT, BLOOD: Hemoglobin: 9.9 g/dL — ABNORMAL LOW (ref 13.0–17.0)

## 2010-11-20 LAB — ABO/RH: ABO/RH(D): O POS

## 2010-11-20 LAB — TYPE AND SCREEN
ABO/RH(D): O POS
Antibody Screen: NEGATIVE

## 2010-12-23 NOTE — Op Note (Signed)
[name], [name] NO.:  [id number]   MEDICAL RECORD NO.:  [id number]          PATIENT TYPE:  INP   LOCATION:  2304                         FACILITY:  [facility]   PHYSICIAN:  Sheliah Plane, MD    DATE OF BIRTH:  1961/08/02   DATE OF PROCEDURE:  11/07/2008  DATE OF DISCHARGE:                               OPERATIVE REPORT   PREOPERATIVE DIAGNOSIS:  Acute myocardial infarction with 3-vessel  coronary artery disease.   POSTOPERATIVE DIAGNOSES:  1. Acute myocardial infarction with 3-vessel coronary artery disease.  2. A large left apical pulmonary bleb.   PROCEDURES PERFORMED:  Emergency coronary artery bypass grafting x3 with  left internal mammary to left anterior descending coronary artery,  reverse saphenous vein graft to the circumflex coronary artery, reverse  saphenous vein graft to the posterior descending coronary artery and  stapling of large left apical bleb, submitted to pathology with right  leg endovein harvesting.   SURGEON:  Sheliah Plane, MD   FIRST ASSISTANT:  Rowe Clack, PA-C   BRIEF HISTORY:  As outlined in consult just performed.  The patient  presented with known coronary occlusive disease.  Previous angioplasty  and stenting of the LAD and circumflex with fully occluded stent to the  circumflex and severe 3-vessel coronary artery disease with prolonged  chest pain for at least 10 hours.  The infarct vessel was reopened but  angiographically was still somewhat fuzzy, and it was decided to take  the patient urgently for coronary artery bypass grafting.  Risks and  options were discussed and explained to the patient who agreed and  signed informed consent.   DESCRIPTION OF THE PROCEDURE:  With Swan-Ganz and arterial line monitors  in place, the patient underwent general endotracheal anesthesia without  incidents.  Skin of the chest and legs prepped with Betadine and draped  in usual sterile manner.  Using the [employer] endovein harvest  system,  vein was harvested from the right thigh and was of good quality and  caliber.  Median sternotomy was performed.  Left internal mammary artery  was dissected down the pedicle graft.  As the mammary artery was  dissected down and the left chest entered, it was apparent that the  patient had 5- to 6-cm large apical bleb involving the left upper lobe  to prevent potential risk of rupture of this, especially on positive  pressure ventilation and a linear stapler was used to staple across the  base of the bleb and the bleb was submitted to the pathology.  Pericardium was opened.  The patient had evidence of biventricular  hypertrophy.  Overall appeared to have preserved LV function as noted on  TEE with trace mitral regurgitation.  The patient was systemically  heparinized in the Cath Lab and also have been given Angiomax  and  integralin.  The ascending aorta was cannulated.  The right atrium was  cannulated.  An aortic root vent cardioplegia needle was introduced into  the ascending aorta.  The patient was placed on cardiopulmonary bypass  at 2.4 L/min/sq. m.  Sites of anastomosis were selected and dissected  out of the epicardium.  The patient's body temperature was cooled to 30  degrees.  Aortic cross-clamp was applied and 500 mL of cold blood  potassium cardioplegia was administered with diastolic arrest of the  heart.  Myocardial septal temperatures monitored throughout the cross-  clamp period.  Attention was turned first to the circumflex vessel,  which was intramyocardial.  The vessel was opened and admitted a 1.5-mm  probe, it was approximately 1.8 mm in size but a diffusely diseased  vessel with entire wall thickened.  Using a running 7-0 Prolene, distal  anastomosis was performed.  Attention was then turned to the posterior  descending coronary artery, which was opened and admitted a 1.5-mm  probe, it was 1.6-1.7 mm in size.  Using a running 7-0 Prolene, distal  anastomosis  was performed.  The left anterior descending coronary artery  was intramyocardial vessel, was located, opened and it admitted a 1.5-mm  probe.  Additionally using a running 8-0 Prolene, left internal mammary  artery was anastomosed to left anterior descending coronary artery.  There was rise in myocardial septal temperature with release of the  bulldog on the mammary artery.  The bulldog was placed back on the  mammary artery.  With the cross-clamp still in place, 2 punch  aortotomies were performed.  Each of the two vein grafts were  anastomosed to the ascending aorta.  Air was evacuated from the  ascending aorta and cross-clamp.  Bulldog on the mammary artery was  removed with rise in myocardial septal temperature.  Aortic cross-clamp  was removed with total cross-clamp time of 78 minutes.  The patient  spontaneously converted into a sinus rhythm.  He was loaded with a low-  dose milrinone and started on dopamine infusion.  With body temperature  rewarmed to 37 degrees, he was then ventilated, weaned from  cardiopulmonary bypass without difficulty, remained hemodynamically  stable.  He was decannulated in usual fashion.  Protamine sulfate was  administered.  Atrial and ventricular pacing wires were applied.  The  left chest tube and a Blake mediastinal drain were left in place.  Pericardium was reapproximated.  Sternum was closed with #6 stainless  steel wire.  Fascia closed with interrupted 0 Vicryl and 3-0 Vicryl in  subcutaneous tissue, 4-0 subcuticular stitch in skin edges.  Dry  dressings were applied.  Sponge and needle count was reported as correct  at the completion of procedure.  The patient tolerated the procedure  without obvious complication and was transferred to the Surgical  Intensive Care Unit for further postoperative care.      Sheliah Plane, MD  Electronically Signed     EG/MEDQ  D:  11/07/2008  T:  11/08/2008  Job:  [id number]   cc:   Verne Carrow,  MD

## 2010-12-23 NOTE — Consult Note (Signed)
[name], [name] NO.:  [id number]   MEDICAL RECORD NO.:  [id number]          PATIENT TYPE:  INP   LOCATION:  2304                         FACILITY:  [facility]   PHYSICIAN:  Sheliah Plane, MD    DATE OF BIRTH:  Aug 28, 1960   DATE OF CONSULTATION:  11/07/2008  DATE OF DISCHARGE:                                 CONSULTATION   REQUESTING PHYSICIAN:  Verne Carrow, MD   FOLLOWUP CARDIOLOGIST:  Verne Carrow, MD   PRIMARY CARE DOCTOR:  Unknown.   REASON FOR CONSULTATION:  Acute lateral wall myocardial infarction.   HISTORY OF PRESENT ILLNESS:  The patient is a 50 year old male with  known coronary occlusive disease, who over the past several months has  been having substernal chest discomfort consistent with angina.  The  episodes became more frequent several days prior to his admission, more  prolonged and severe and at rest.  Finally this morning, the patient had  chest pain since last evening, could not bear the discomfort any  further, came to the emergency room early this morning with elevated  troponin 25 and EKG changes consistent with inferolateral myocardial  infarction.  He was taken directly to the cardiac cath lab.  He has  previously had cardiac catheterizations with non-drug-coated stents  placed in his LAD and his proximal circumflex in 2002 and 2003.  Since  that time, he has had poor compliance with known hyperlipidemia and  continues to smoke 2 packs a day.   Dr. Clifton James took the patient to the cath lab.  Cardiac catheterization  findings are reviewed with him.  The patient has significant three-  vessel disease, acute infarct vessel with occlusion of the circumflex  just prior to the stent.   PAST MEDICAL HISTORY:  Significant for:  1. Hyperlipidemia.  2. Hypertension.  3. History of acute cerebellar stroke when he was hospitalized and      seen by Dr. Sharene Skeans in 2008.  4. History of polysubstance abuse including cocaine 2  weeks ago.  5. Overall, poor compliance with therapy.   PREVIOUS SURGERY:  None.   FAMILY HISTORY:  Significant for coronary occlusive disease.   CURRENT MEDICATIONS:  It is unclear, but the listed as related to the  patient was:  1. Pravastatin 40 mg a day.  2. Aspirin 325 mg.  3. Hydrochlorothiazide 25 a day.  4. Lopressor 50 b.i.d.  It is unclear how much of this he was taking.   REVIEW OF SYSTEMS:  Denies fever, chills, or night sweats.  Does note  use of cocaine 2 weeks ago, notes that he still has numbness in the  right arm since his stroke 2 years ago.  Denies blood in his stool or  urine.  Denies recent hemoptysis.   SOCIAL HISTORY:  The patient is currently unemployed Nutritional therapist.   PHYSICAL EXAMINATION:  GENERAL:  He is seen in the cath lab and again in  the holding room.  VITAL SIGNS:  Blood pressure 110/76, pulse 65, respiratory rate 18, and  O2 sat on room air is 98%.  NECK:  He has no carotid  bruits.  LUNGS:  Distant breath sounds with faint crackles.  ABDOMEN:  Benign.  He has a closure device dressing placed in the right  groin and there is a very small hematoma.  He has 2+ DP and PT pulses  bilaterally.  Appears to have adequate vein for bypass.   The patient is right handed.  Doppler studies reveal as the dominant  supplier of the left hand.   Cardiac catheterization films are reviewed with Dr. Clifton James.  The  patient has significant proximal disease distal and mid disease in the  right coronary artery, all of greater than 90%.  The proximal LAD has a  long tubular stenosis of 80%.  Circumflex is acutely occluded.  A wire  has been passed across the vessel and reopened, no further stent was  placed, clot was extracted.  However, the final results still has some  fuzziness in the column of dye.   IMPRESSION:  Acute myocardial infarction in a patient, who is poorly  compliant with severe three-vessel coronary artery disease.  He is now  hemodynamically stable  without pain, but with critical three-vessel  disease including freshly angioplastied circumflex lesion, which is now  open, but still hazy.  The patient has preserved LV function in spite of  having prolonged pain even before presentation, appears to have intact  ventricular function.  I have recommended to the patient that we proceed  with coronary artery bypass grafting today.  He is aware of the  increased risk because of his long smoking history and the acute episode  and his previous history of stroke.  The risk of surgery including  death, infection, stroke, myocardial infarction, bleeding, and blood  transfusion all discussed with the patient in detail and he is willing  to proceed.  He contacted his family who is out of town by a phone.       Sheliah Plane, MD  Electronically Signed     EG/MEDQ  D:  11/07/2008  T:  11/08/2008  Job:  [id number]   cc:   Verne Carrow, MD

## 2010-12-23 NOTE — Discharge Summary (Signed)
NAMECLENNON, [name] NO.:  [id number]   MEDICAL RECORD NO.:  [id number]          PATIENT TYPE:  INP   LOCATION:  2015                         FACILITY:  [facility]   PHYSICIAN:  Sheliah Plane, MD    DATE OF BIRTH:  1960-09-13   DATE OF ADMISSION:  11/07/2008  DATE OF DISCHARGE:  11/11/2008                               DISCHARGE SUMMARY   ADMITTING DIAGNOSES:  1. ST-elevation myocardial infarction.  2. History of coronary artery disease (status post percutaneous      transluminal coronary angioplasty with stent to the left anterior      descending and circumflex).  3. History of cerebrovascular accident.  4. History of hypertension.  5. History of hyperlipidemia.  6. History of tobacco abuse.  7. History of cocaine abuse.   DISCHARGE DIAGNOSES:  1. ST-elevation myocardial infarction.  2. History of coronary artery disease (status post percutaneous      transluminal coronary angioplasty with stents to the left anterior      descending and circumflex).  3. History of cerebrovascular accident.  4. History of hypertension.  5. History of hyperlipidemia.  6. History of tobacco abuse.  7. History of cocaine abuse.  8. Postoperative acute blood loss anemia and thrombocytopenia.   PROCEDURES:  1. Cardiac catheterization performed by Dr. Clifton James on November 07, 2008.  For specifics, please see the medical record, but in brief      there was a proximal 80% stenosis prior to the stented segment of      the left anterior descending, first diagonal was small with a 70%      stenosis, second diagonal moderate size of 60% stenosis, circumflex      artery had 100% proximal occlusion, right coronary artery had      proximal 80% stenosis followed by 99% stenosis of the midportion      vessel, posterior descending artery had a distal 95% stenosis, and      ejection fraction was preserved at 50-55%.  2. Emergent coronary artery bypass graft x3 (left internal mammary  artery to left anterior descending, saphenous vein graft to      circumflex, saphenous vein graft to posterior descending artery,      and stapling of left apical bleb done by Dr. Tyrone Sage).   PATHOLOGY RESULTS:  Left upper lobe bleb resection showed benign lung  parenchyma and benign pleural with subpleural blebs.   HISTORY OF PRESENT ILLNESS:  This is a 50 year old Caucasian male with a  past medical history of coronary artery disease (PTCA with stents to  circumflex in 2002, LAD in 2003), history of CVA, history of  hypertension, history of hyperlipidemia, history of tobacco and cocaine  abuse who according to medical records had been having substernal chest  discomfort consistent with angina over the past several months, however,  just previous to this admission became more frequent, more severe, and  began occurring at rest as well.  The patient called EMS and he was  brought to [facility] Emergency Department for further evaluation and  treatment.  EKG showed changes consistent  with an inferior myocardial  infarction.  Cardiac panel revealed total creatinine to be 2642, CK-MB  245, relative index 9.3, and troponin 27.69.  The patient emergently  underwent a cardiac catheterization with Dr. Clifton James.  He was found to  have multivessel coronary artery disease with preserved EF.  Balloon  angioplasty was performed in the proximal circumflex after thrombus was  extracted with thrombectomy catheter.  There was restoration of flow  then in the obtuse marginal after the procedure.  A consultation was  obtained with Dr. Tyrone Sage and it was decided the patient needed to  undergo an emergent coronary artery bypass grafting surgery.  This was  done as previous stated on November 07, 2008.   BRIEF HOSPITAL COURSE STAY:  The patient remained afebrile and  hemodynamically stable.  He was extubated late the evening of surgery.  All lines were removed and drips were weaned off as tolerated.  All   chest tubes were removed by November 08, 2008.  Followup chest x-ray  revealed no pneumothorax, worsening atelectasis in the lower lobes,  right greater than left and persistent small bilateral pleural  effusions.  The patient was slowly progressing with cardiac rehab.  He  was transferred from the ICU to [facility] for further convalescence.  He had  required several liters of oxygen via nasal cannula.  This was weaned  off with the next couple of days.  The patient continued to improve such  that on postop day #4, he was tolerating a healthy heart diet.  He had a  bowel movement.  He was afebrile.  Vital signs were stable.  His O2 sat  was 93% on room air.  Preop weight was 90 kg, today it was down to 89.2  kg.   PHYSICAL EXAMINATION:  CARDIOVASCULAR:  Regular rate and rhythm.  PULMONARY:  Slightly decreased at the bases.  ABDOMEN:  Soft, nontender.  Bowel sounds present.  EXTREMITIES:  Trace right lower extremity edema.  Sternal and right  lower extremity wounds are clean and dry.  Epicardial pacing wires are  going to be removed.  The patient will then be discharged in  approximately 4 hours after removal of epicardial pacing wires.   LATEST LABORATORY STUDIES:  His BMET done on November 10, 2008, sodium 133,  potassium 4, BUN and creatinine 9 and 0.84 respectively.  CBC done on  this date, H and H 10.3 and 29 respectively.  White count 10,800 and  platelet count 122,000.  Last chest x-ray done on this date showed  slightly better aeration, little change, and bibasilar atelectasis and  small effusions.   DISCHARGE INSTRUCTIONS:  The patient is to remain on a low-fat, low-salt  diet.  He is not to drive or lift more than 10 pounds.  He is to  ambulate 3-4 times daily and increase his frequency and duration as he  tolerates.  Continue with his breathing exercises daily.  He may shower.  He is to cleanse his wound with mild soap and water.  He is to call the  office if any wound problems arise.   He was instructed on the importance  of stop smoking.  He was given a number for smoking cessation classes as  well as alternative options, i.e. nicotine patch, gum and the patient  prefers at this time to quit on his own.Elenor Quinones APPOINTMENTS:  The patient needs to contact Dr. Gibson Ramp  office for a followup appointment in 2 weeks.  He also will  have an  appointment made by our office to see Dr. Tyrone Sage in 3 weeks and prior  to this office appointment, a chest x-ray will be obtained.   DISCHARGE MEDICATIONS:  1. Pravastatin 40 mg p.o. at bedtime.  2. Enteric-coated aspirin 325 mg p.o. daily.  3. Lupus 25 p.o. 2 times daily.  4. Lisinopril 2.5 mg p.o. daily.  5. Nu-Iron 150 mg p.o. daily.  6. Lasix 40 mg p.o. daily x3 days.  7. KCl 20 mg p.o. daily x3 days.  8. Oxycodone 5 mg 1 tablet every 4-6 hours as needed for pain.      Doree Fudge, [place]      Sheliah Plane, MD  Electronically Signed    DZ/MEDQ  D:  11/11/2008  T:  11/12/2008  Job:  [id number]   cc:   Verne Carrow, MD

## 2010-12-23 NOTE — H&P (Signed)
[name], [name] NO.:  [id number]   MEDICAL RECORD NO.:  [id number]          PATIENT TYPE:  INP   LOCATION:  2399                         FACILITY:  [facility]   PHYSICIAN:  Verne Carrow, MDDATE OF BIRTH:  [birth year]   DATE OF ADMISSION:  11/07/2008  DATE OF DISCHARGE:                              HISTORY & PHYSICAL   HISTORY OF PRESENT ILLNESS:  A 50 year old Caucasian male with history  of coronary artery disease status post prior stent placement in the  circumflex in 2002, in the LAD in 2003 with bare-metal stents as well as  hypertension; hyperlipidemia; tobacco abuse; and prior CVA, who was  admitted with an acute inferolateral ST-elevation myocardial infarction.  The patient called EMS around 5:30 a.m. and was brought to the [facility] Emergency Department, where he was found to have inferolateral ST-  segment elevation.  He was brought emergently to the Cardiac  Catheterization Laboratory and an emergent left-heart catheterization  was performed.  He was found to have a severe stenosis in the proximal  LAD, severe stenosis in the mid right coronary artery, and a total  occlusion of the stented segment of the proximal circumflex coronary  artery.  Balloon angioplasty was performed in the proximal circumflex  after thrombus was extracted with a thrombectomy catheter.  There was  restoration of TIMI III flow down the obtuse marginal after the balloon  procedure.  The patient's chest pain resolved and his ST-segment  elevation resolved.  Referral was made to CT Surgery while the patient  was on the cath lab table.  The patient was stable and was evaluated by  CT Surgery.  Dr. Tyrone Sage will plan on performing a surgical  revascularization bypass procedure later today if the patient's pain  recurs, and most likely within the next 2 days if the patient remains  stable.   PAST MEDICAL HISTORY:  1. Coronary artery disease status post prior stenting  procedures in      the LAD and circumflex.  2. Prior CVA.  3. Hypertension.  4. Hyperlipidemia.  5. Ongoing tobacco abuse.  6. Recent cocaine use with history of abuse.   PAST SURGICAL HISTORY:  None.   ALLERGIES:  No known drug allergies.   MEDICATIONS:  1. Pravastatin 40 mg once daily.  2. Aspirin 325 once daily.  3. Hydrochlorothiazide 25 mg once daily.  4. Lopressor 50 mg twice daily.   SOCIAL HISTORY:  The patient was a Nutritional therapist.  Continues to use tobacco,  has used cocaine recently, and admits to using alcohol on an almost  daily basis.   FAMILY HISTORY:  Positive for coronary artery disease.   REVIEW OF SYSTEMS:  The patient had onset of chest pain this morning  associated with dyspnea and diaphoresis.  There was also some nausea.   PHYSICAL EXAMINATION:  VITAL SIGNS:  Blood pressure 115/76, pulse 65 and  regular, respirations 12 and unlabored, sating 98% on room air.  GENERAL:  He is a middle-aged Caucasian male, who is alert and oriented  x3, and having chest pain at the time of arrival.  NECK:  No JVD.  No carotid bruits.  No thyromegaly.  SKIN:  Warm and dry.  PSYCHIATRIC:  Mood and affect are appropriate.  MUSCULOSKELETAL:  Muscle strength and tone is normal.  NEUROLOGIC:  No focal neurological deficits.  LUNGS:  Clear to auscultation bilaterally without wheezes, rhonchi, or  crackles noted.  CARDIOVASCULAR:  Regular rate and rhythm without murmurs, gallops, or  rubs noted.  ABDOMEN:  Soft, nontender.  Bowel sounds are present.  EXTREMITIES:  No evidence of edema.   DIAGNOSTIC STUDIES:  1. 12-lead EKG shows inferolateral ST-segment elevation with      reciprocal changes.  2. Initial CBC shows hemoglobin 16, hematocrit 46, platelets 268.   ASSESSMENT AND PLAN:  This is a 50 year old Caucasian male with history  of coronary artery disease, who is admitted with an acute occlusion of  his proximal circumflex artery in the stented segment.  We have  performed  a balloon angioplasty and have restored flow to this vessel.  Given his multivessel disease as well as history of medical  noncompliance and continued tobacco abuse, a referral was made to  Surgery for a possible surgical revascularization procedure.  The  patient is currently stable and will be admitted to the ICU on a heparin  drip and Integrilin drip.  We will continue aspirin and a statin.  We  will avoid any beta-blockers secondary to recent cocaine use.  We will  check a urine drug screen.  We will check an echocardiogram and will  follow serial cardiac enzymes.  As mentioned above, CT  Surgery consult has been placed for bypass procedure.  If the patient  has recurrent pain, then he will be taken for emergent bypass today.  We  will also obtain a PA and lateral chest x-ray.      Verne Carrow, MD  Electronically Signed     CM/MEDQ  D:  11/07/2008  T:  11/07/2008  Job:  [id number]

## 2010-12-23 NOTE — Cardiovascular Report (Signed)
[name], [name] NO.:  [id number]   MEDICAL RECORD NO.:  [id number]          PATIENT TYPE:  INP   LOCATION:  2807                         FACILITY:  [facility]   PHYSICIAN:  Verne Carrow, MDDATE OF BIRTH:  [birth year]   DATE OF PROCEDURE:  11/07/2008  DATE OF DISCHARGE:                            CARDIAC CATHETERIZATION   PRIMARY CARDIOLOGIST:  Colleen Can. Deborah Chalk, MD had formally followed this  patient.   PROCEDURES PERFORMED:  1. Left heart catheterization.  2. Selective coronary angiography.  3. Left ventricular angiogram.  4. Thrombectomy catheter used in the proximal circumflex artery in the      totally occluded vessel.  5. PTCA of the proximal circumflex artery.  6. Placement of Angio-Seal femoral artery closure device in the right      femoral artery.   OPERATOR:  Verne Carrow, MD   INDICATION:  Inferolateral ST elevation myocardial infarction in a  patient with a history of cocaine abuse, tobacco abuse, prior coronary  artery disease with stent placement in the proximal LAD and proximal  circumflex arteries, who began having chest pain 3 days ago.  The  patient called EMS this morning and was brought to the [facility]  Emergency Department where he was found to have inferolateral ST-segment  elevation.   PROCEDURE IN DETAIL:  The patient was brought emergently to the cardiac  catheterization laboratory.  The right groin was prepped and draped in a  sterile fashion.  Lidocaine 1% was used for local anesthesia.  A 6-  French sheath was inserted into the right femoral artery without  difficulty.  Standard diagnostic catheters were used to perform the  diagnostic catheterization.  The patient was found to have a total  occlusion of the proximal circumflex artery.  Angiomax bolus was given  and a drip was started.  The patient was not loaded with Plavix  secondary to the possibility of multivessel disease.  He was given a  full-strength  aspirin prior to the procedure.  He was also given  intravenous heparin prior to the procedure.  An XB 3.5 guiding catheter  was used to selectively engage the left main coronary artery.  When the  ACT was greater than 200, a Cougar intracoronary wire was passed down  into the large obtuse marginal branch of the circumflex artery.  A Fetch  thrombectomy catheter was used to extract thrombus from the proximal  occlusion.  A 2.5 x 15-mm balloon was then used to dilate the lesion in  the prior stented segment in the proximal circumflex artery.  Two  balloon inflations were made in an overlapping fashion.  There was  return of TIMI III flow after the balloon inflations.  Given the  patient's multivessel disease, I elected to keep the wire in the vessel  and contacted Cardiothoracic Surgery for a consultation in the cath lab.  I felt that bypass would be the best revascularization for this patient.  We then removed our guiding catheter and the wire after the case was  reviewed with Dr. Tyrone Sage.  A left ventricular angiogram was performed.  The patient was taken  off the table in stable condition.  He was started  on Integrilin here in the cath lab.   HEMODYNAMIC DATA:  Central aortic pressure 115/76, left ventricular  pressure 109/6, end-diastolic pressure 16.   ANGIOGRAPHIC DATA:  1. Left main.  No obstructive disease.  2. Left anterior descending.  There is a proximal 80% stenosis prior      to the stented segment.  The mid-stented segment has minimal in-      stent restenosis.  Just distal to the stented segment, the vessel      bifurcates into a moderate-sized distal LAD and a moderate-sized      diagonal branch.  The distal LAD has diffuse plaque disease.  The      first diagonal is small caliber with an ostial 70% stenosis.  The      second diagonal is moderate sized with a mid 60% stenosis.  3. The circumflex artery has a 100% proximal occlusion.  4. The right coronary artery has  proximal 80% stenosis followed by a      99% stenosis in the midportion of the vessel.  The distal vessel      has a 40% plaque.  This bifurcates into the PDA and a      posterolateral branch.  The PDA has a distal 95% stenosis.  5. Left ventricular angiogram was performed in the RAO projection and      shows normal left ventricular systolic function with an ejection      fraction of 50-55%.   IMPRESSION:  1. Acute inferolateral ST elevation myocardial infarction.  2. Percutaneous transluminal coronary angioplasty of the circumflex      artery.  3. Normal left ventricular systolic function.   RECOMMENDATIONS:  The case was discussed at the bedside in the cath lab  with Dr. Tyrone Sage.  We will plan on performing a surgical  revascularization later today if the patient becomes unstable or in the  next several days if the patient remains stable.  We will treat him with  a heparin drip and an Integrilin drip as well as aspirin and a statin.  I will avoid any beta blockers right now secondary to his cocaine use  history.  We will check a urine drug screen as well as a full lab panel.      Verne Carrow, MD  Electronically Signed     CM/MEDQ  D:  11/07/2008  T:  11/07/2008  Job:  [id number]

## 2010-12-23 NOTE — Assessment & Plan Note (Signed)
OFFICE VISIT   Kenneth, [name]  DOB:  1960/12/15                                        December 06, 2008  CHART #:  [id number]   [name] returns to the office today in follow-up after his emergency  coronary artery bypass grafting x3 on November 07, 2008.  At the time he  was also found to have significant emphysematous changes in his lungs  and a large left apical bleb which was also resected.  The patient  although he presented with acute infarction progressed postoperatively  well.  He is now about 1 month postop.  He has had several calls to the  office to renew his pain medicine though he denies any shortness of  breath.  He denies any recurrent chest pain.   PHYSICAL EXAMINATION:  VITAL SIGNS:  On exam his blood pressure 144/88,  pulse is 80, respiratory rate is 18, O2 saturations 99%.  His sternum is  stable and well healed.  The right Endovein harvest site is also well  healed.  He has no pedal edema.  He still complains of some numbness and  tingling in his hands which he notes was present preoperatively related  to a stroke.   Follow-up chest x-ray shows clear lung fields bilaterally with improved  aeration.   CURRENT MEDICATIONS:  1. Pravastatin 40 mg a day.  2. He was discharged home on aspirin 325 mg a day, but was not taking      it and again he was strongly encouraged to take it daily as has      ordered.  3. Lopressor 25 b.i.d.  4. Lisinopril 2.5 a day.  5. We have given him a repeat prescription for Lortab 7.5/500 30      tablets and cut the dose back to one every 6 hours and told him      with this prescription to slowly wean off the pain medication using      it primarily at night.   He was allowed to return to work if he does no lifting over 25 pounds.  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   Unfortunately he admits to smoking three cigarettes since discharged  home.  This was strongly emphasized to him that he now has COPD and  coronary artery disease and that he needs to not smoke.  He notes that  he continues to make an effort to stop. I made him a follow-up  appointment see Dr. Clifton James for cholesterol check and liver enzyme  checks in the near future.  I have not made him return appointment to  see me but would be glad to see him at his or Dr. Gibson Ramp request  anytime.   Sheliah Plane, MD  Electronically Signed   EG/MEDQ  D:  12/06/2008  T:  12/06/2008  Job:  [id number]   cc:   Verne Carrow, MD

## 2010-12-26 NOTE — H&P (Signed)
[place]. [facility]  Patient:    BLONG, BUSK Visit Number: [id number] MRN: [id number]          Service Type: MED Location: [phone] Attending Physician:  Eleanora Neighbor Dictated by:   Darci Needle, [name] Admit Date:  11/14/2001 Discharge Date: 11/16/2001   CC:         Colleen Can. Deborah Chalk, [name]   History and Physical  REASON FOR ADMISSION:  Chest pain.  HISTORY OF PRESENT ILLNESS:  Mr. Hudspeth is 50 years of age, is a heavy cigarette smoker, and has a history of CAD.  He is admitted with prolonged chest pain that awakened him at 3:30 a.m. and required four nitroglycerin tablets with relief some 60 minutes later.  He has a history of an anterior myocardial infarction on December 07, 2000.  He received a stent to the LAD for a totally occluded LAD at that time.  He was also noted to have a 20-40% circumflex at that time.  He is currently pain-free.  PAST MEDICAL HISTORY: 1. Hypertension. 2. Tobacco abuse.  ALLERGIES:  None.  MEDICATIONS:  Altace 2.5 mg p.o. q.d., Toprol XL 50 mg per day, aspirin one per day, but please note he is taking none of these medications.  HABITS:  Smokes 1-1/2 to two packs per day and drinks greater than a six-pack of beer a day.  FAMILY HISTORY:  Positive for CAD.  SOCIAL HISTORY:  He is not married.  He lives with his fiancee.  They have children between them.  REVIEW OF SYSTEMS:  Unremarkable.  The patient wants to go home.  PHYSICAL EXAMINATION:  VITAL SIGNS:  Blood pressure 160/88, heart rate 100.  SKIN:  Has multiple tattoos, otherwise unremarkable.  HEENT:  No xanthelasma.  Extraocular movements full.  CHEST:  Positive rhonchi, otherwise clear.  CARDIAC:  Positive S4.  ABDOMEN:  Soft.  Liver and spleen not palpable.  EXTREMITIES:  No edema.  Pulses 2+ and symmetric in the upper and lower extremities.  NEUROLOGIC:  Normal.  DIAGNOSTIC STUDIES:  EKG reveals an old septal infarct, no  acute changes. Chest x-ray is unremarkable.  CK-MB 100/1.5.  ASSESSMENT: 1. Prolonged chest pain, rule out myocardial infarction. 2. Tobacco abuse. 3. Medication noncompliance.  PLAN: 1. Serial enzymes and EKGs. 2. Further workup per Dr. Deborah Chalk. Dictated by:   Darci Needle, [name] Attending Physician:  Eleanora Neighbor DD:  11/14/01 TD:  11/14/01 Job: [id number]

## 2010-12-26 NOTE — Cardiovascular Report (Signed)
[place]  Patient:    [name], [name]                     MRN: [id number] Proc. Date: 12/07/00 Adm. Date:  [id number] Attending:  Eleanora Neighbor                        Cardiac Catheterization  INDICATIONS:  The patient presented to emergency room with several hours of substernal chest pain and EKG changes compatible with acute anterior myocardial infarction.  PROCEDURE:  Left heart catheterization with selective coronary angiography and left ventricular angiography with angioplasty and stenting of the left anterior descending coronary.  TYPE AND SITE OF ENTRY:  Percutaneous right femoral artery.  CATHETERS: 6-French four curved Judkins right and left catheters, 6-French pigtail ventriculography catheter, JL4 7-French guide, high-torque floppy guidewire, 3.0 x 20 CrossSail balloon, and a 3.0 x 15 mm Penta stent.  CONTRAST:  Omnipaque.  MEDICATIONS GIVEN PRIOR TO PROCEDURE:  Heparin 5000 units IV, IV nitroglycerin, and aspirin.  MEDICATIONS GIVEN DURING PROCEDURE:  Integrilin, fentanyl 25 mcg, and Versed 2 mg IV.  COMMENTS:  The patient tolerated the procedure well.  HEMODYNAMIC DATA:  The aortic pressure was 138/89.  LV pressure was 120/2-14. There was no aortic valve gradient noted on pullback.  ANGIOGRAPHIC DATA:  1. Left main coronary artery is normal. 2. Left anterior descending is totally occluded after the first diagonal    vessel. 3. Left circumflex: The left circumflex had diffuse irregularities with    approximately a 20 to 40% narrowing in the early mid portion.  It was a    large vessel. 4. Right coronary artery: The right coronary artery is a dominant vessel.    It has somewhat diffuse irregularities but is a large vessel with large    diameter.  ANGIOPLASTY PROCEDURE:  At this point in time, we exchanged catheters for the angioplasty procedure.  After first injection, we did have some degree of reperfusion.   We were able to see a subtotal stenosis just proximal to the bifurcation lesion.  A high-torque floppy guidewire was passed into the left anterior descending.  Using the 3.0 x 20 mm CrossSail balloon, we dilated to a maximum of 9 atmospheres and reestablished good flow.  We then returned with a 3.0 x 15 mm Penta stent.  It was inflated to maximum of 10 atmospheres just proximal to the diagonal, and it was positioned in what was felt to be a quite satisfactory position.  There was excellent distal flow, and the patient improved clinically.  At this point in time, we performed a left ventricular angiogram.  Left ventricular angiogram showed anterior wall hypokinesis but a global ejection fraction of probably 50%.  There was no mitral regurgitation, intracoronary calcification, or intracoronary filling defect.   The entire anterior wall from the mid portion of the apex was mildly hypokinetic.  OVERALL IMPRESSION: 1. Acute anterior myocardial infarction with successful angioplasty and    stenting of left anterior descending. 2. Mild to moderate anterior hypokinesis with a reduced ejection fraction of    45 to 50%. 3. Mild diffuse atherosclerosis in the other vessels (20 to 40% in the    circumflex artery and minimal scattered atherosclerosis in the right    coronary artery). DD:  12/07/00 TD:  12/07/00 Job: [id number]

## 2010-12-26 NOTE — Discharge Summary (Signed)
[name], [name] NO.:  [id number]   MEDICAL RECORD NO.:  [id number]          PATIENT TYPE:  INP   LOCATION:  2009                         FACILITY:  [facility]   PHYSICIAN:  Colleen Can. Deborah Chalk, M.D.DATE OF BIRTH:  [birth year]   DATE OF ADMISSION:  11/21/2006  DATE OF DISCHARGE:  11/24/2006                               DISCHARGE SUMMARY   PRIMARY DISCHARGE DIAGNOSES:  1. Chest pain with subsequent negative cardiac enzymes and negative      Cardiolite study for ischemia.  He has an ejection fraction of 48%      with a fixed anterior scar.  2. Multisubstance abuse which includes cocaine, alcohol, and tobacco.  3. Hypertension.  4. Known ischemic heart disease with previous history of angioplasty      to the circumflex in 2003 and to the LAD in 2002.   HISTORY OF PRESENT ILLNESS:  The patient is a 50 year old white male who  has known coronary disease.  He has had remote PCIs performed as well as  hypertension and multisubstance abuse.  He presented to the emergency  room by his family for complaints of chest heaviness and shortness of  breath with nausea, vomiting, weakness, and dizziness.  He was  subsequently seen and admitted for further evaluation.  His family  reported excessive alcohol use.  His drug screen was positive for  cocaine.   Please see the dictated History and Physical per Dr. Lady Deutscher for  further patient presentation and profile.   LABORATORY DATA:  Chest x-ray showed no acute cardiopulmonary disease.  EKG showed normal sinus rhythm with old anterior infarct.  There were no  ST or T wave changes noted.  His cardiac enzymes were negative.  His  drug screen was positive for cocaine and marijuana.  His CT of the head  was unremarkable.   Chemistries showed sodium 132, potassium 4.6, chloride 103, BUN 12,  glucose 89.  LFTs were normal.  CBC was normal.   HOSPITAL COURSE:  The patient was admitted.  He ruled out negative for  myocardial  infarction.  He was placed on Ativan protocol for  detoxification.  Proceeded on with Cardiolite study on November 23, 2006.  Those results were as noted above.  He has had no further chest pain.  Cardiac enzymes were negative.  It is felt that most of his symptoms are  due to multisubstance abuse.  Today on November 24, 2006 his overall  physical exam is unremarkable.  He is anxious to go home.   DISCHARGE CONDITION:  Stable.   DISCHARGE MEDICINES:  1. Aspirin daily.  2. Lopressor 25 b.i.d.  3. Over-the-counter Prilosec daily.  4. Nitroglycerin p.r.n.   He is strongly encouraged to not use cocaine, alcohol or tobacco  products.   PLAN:  Will plan on seeing him back in about two weeks or certainly  sooner if needed.      Sharlee Blew, N.P.      Colleen Can. Deborah Chalk, [name]  Electronically Signed    LC/MEDQ  D:  11/24/2006  T:  11/24/2006  Job:  [id number]

## 2010-12-26 NOTE — Cardiovascular Report (Signed)
[place]. [facility]  Patient:    [name], [name] Visit Number: [id number] MRN: [id number]          Service Type: MED Location: [phone] Attending Physician:  Eleanora Neighbor Dictated by:   Colleen Can. Deborah Chalk, [name] Proc. Date: 11/15/01 Admit Date:  11/14/2001                          Cardiac Catheterization  HISTORY:  [name] had previous anterior myocardial infarction treated with direct angioplasty and stent placement in April 2002.  He presents with recurrent anginal-like symptoms.  These symptoms are chest discomfort with bilateral arm discomfort and hand discomfort.  He had marked shortness of breath initially.  He was referred for cardiac catheterization and possible angioplasty.  PROCEDURE:  Left heart catheterization with selective coronary angiography and left ventricular angiography, and stent placement in the left circumflex coronary.  CARDIOLOGIST:  Colleen Can. Deborah Chalk, [name]  TYPE AND SITE OF ENTRY:  Percutaneous right femoral artery.  CATHETERS:  6-French 4 curved Judkins right and left coronary catheters, 6-French pigtail ventricular guiding catheter, 7-French JL4 guide, high-torque floppy guidewire, and a 3.5 x 9 mm  Zeta stent.  CONTRAST MATERIAL:  Omnipaque.  MEDICATIONS GIVEN PRIOR TO PROCEDURE:  Valium 10 mg p.o.  MEDICATIONS GIVEN DURING PROCEDURE:  Versed 4 mg IV total, Integrilin, heparin.  COMMENTS:  The patient tolerated the procedure well.  HEMODYNAMIC DATA:  The aortic pressure was 120/76.  LV was 121/11.  There was no aortic valve gradient noted on pullback.  ANGIOGRAPHIC DATA: 1. The left main coronary artery was normal. 2. Left circumflex: The left circumflex continues as a large obtuse marginal.    There is an eccentric plaque with approximately 70% obstruction just prior    to a small continuation branch of this circumflex marginal. 3. Left anterior descending artery: The left anterior descending  artery is a    moderately long vessel.  The area of the stented segment is still    satisfactory without significant restenosis.  There are irregularities in    the proximal left anterior descending.  Just beyond the stent, the vessel    bifurcates, and there is approximately 50% narrowing in this bifurcation,    but it is overall felt to be satisfactory. 4. Right coronary artery: The right coronary artery has scattered 20 to 30%    narrowings.  It is a large dominant vessel.  LEFT VENTRICULAR ANGIOGRAM:  The left ventricular angiogram was performed in the RAO position.  Overall cardiac size and silhouette were normal.  There was mild to minimal anterior hypokinesis, and the global ejection fraction was estimated to be 55 to 60%.  ANGIOPLASTY PROCEDURE:  After some debate, we decided to proceed on with direct stenting of the left circumflex.  Using a high-torque floppy guidewire and a JL4 7-French guide, we were able to position the guidewire down the smaller branch of this marginal system.  The stent was then placed via the ostium of this vessel and dilated successfully without over dilatation of the proximal portion of the small branch.  We then returned and positioned the guidewire down the large marginal, and the balloon was again inflated, and the final angiographic result was felt to be excellent with minimal narrowing at this point and overall excellent flow in both branches.  OVERALL IMPRESSION: 1. Essentially normal left ventricular function with very minimal anterior    hypokinesis. 2. Persistent patency  of the stent in the left anterior descending. 3. Scattered diffuse coronary atherosclerosis. 4. Severe eccentric plaque in the left circumflex coronary artery with    successful stent placement. Dictated by:   Colleen Can Deborah Chalk, [name] Attending Physician:  Eleanora Neighbor DD:  11/15/01 TD:  11/15/01 Job: [id number]

## 2010-12-26 NOTE — Consult Note (Signed)
[name], [name]              ACCOUNT NO.:  [id number]   MEDICAL RECORD NO.:  [id number]          PATIENT TYPE:  INP   LOCATION:  3040                         FACILITY:  [facility]   PHYSICIAN:  Deanna Artis. Hickling, M.D.DATE OF BIRTH:  [birth year]   DATE OF CONSULTATION:  12/04/2006  DATE OF DISCHARGE:                                 CONSULTATION   CHIEF COMPLAINT:  Unsteady gait of 13 days duration.   HISTORY OF PRESENT CONDITION:  [name] is a 50 year old right-handed  gentleman with past history of coronary disease who represented on November 21, 2006 with chest pain and disequilibrium.  Myocardial infarction was  ruled out.  He felt as if he was veering to left when he walked.  This  was not evaluated other than CT scan which was negative.   Patient has continued to have symptoms of disequilibrium, falling to the  right and clumsiness of his right hand.  He found it difficult to work.  He see had an MRI scan of the brain today without contrast that showed  evidence of an acute infarction involving the right posterior inferior  cerebellar artery involving the right lateral medullary region as well  as the inferior cerebellar hemisphere.  The lesion rather than being  confluent looked more as if there had been a shower of emboli.  Careful  evaluation of the vessels of  the base of the brain failed to show  evidence of clot in them.  The patient did not have contrast.  The  lesions looked as if they were no more than 72 hours old showing  positive signal on diffusion exponential ADC, T2 and to a lesser extent  flair.  There does not appear to be any lesion that is of different age  in the vicinity of 61 weeks old.  Contrast was not done so we do not know  if any of these lesions enhance.   The patient's risk factors for stroke include cocaine use which was  discovered on urine tox screen on the hospitalization from April 13 to  16th.  He says this was a one-time deal.  There is  skepticism about  this.  The patient has 50 pack-year history of smoking.  He was  counseled to quit and placed on a Nicoderm 14 mg CQ patch.  He has  dyslipidemia which is being treated with Lipitor, prior myocardial  infarction in 2002 and stent placed in 2003.  He also has hypertension.   CURRENT MEDICATIONS:  Include  1. Lipitor 40 mg daily.  2. Aspirin 325 mg daily.  3. Lopressor 25 mg daily.   DRUG ALLERGIES:  None known.   REVIEW OF SYSTEMS:  12 system review is recorded on the chart and is  negative.   PAST SURGICAL HISTORY:  Coronary stents x2 placed.   FAMILY HISTORY:  Positive for heart disease.   SOCIAL HISTORY:  The patient has been married twice and divorced.  He  has two children ages 21 and 78, one is in college.  The patient works  as a Haematologist.  He is very  worried about his job because of  his disability.  He does not yet have health insurance and will not have  it for another half to one month.   PHYSICAL EXAMINATION:  GENERAL:  On examination today this is a pleasant  gentleman in no acute distress.  VITAL SIGNS:  Blood pressure of 134/90, resting pulse 81, respirations  20, temperature 98.3, oxygen saturation 96% room air.  EAR, NOSE & THROAT:  No signs of infection.  No bruits.  No meningismus.  LUNGS:  Clear.  HEART:  No murmurs.  Pulses normal.  ABDOMEN:  Soft.  Bowel sounds normal.  No hepatosplenomegaly.  EXTREMITIES:  Were normal.  NEUROLOGIC:  Mental status:  The patient is awake, alert without  dysphasia, dyspraxia dysarthria.  Cranial nerves:  Round reactive  pupils.  Extraocular movements full.  No nystagmus.  Symmetric facial  strength midline tongue and uvula.  Air conduction greater than bone  conduction bilaterally. Motor examination: Normal strength, tone and  mass.  Good fine motor movements.  No pronator drift.  The patient's  fine motor movements showed finger tapping a little bit better on the  left hand than the right.  Sensory examination subjective hypesthesia on  the right arm, peripheral polyneuropathy stocking and glove  distribution, good stereognosis.  Cerebellar examination: Finger-to-nose  shows an intention tremor on the right. Heel-knee-shin seems to be fine.  When he walks, he drifts to the right side.  He cannot tandem without  falling to the right.  Romberg is negative.  Deep tendon reflexes were  diminished proximally, absent at the ankles.  The patient had bilateral  flexor plantar responses.   IMPRESSION:  1,  Right posterior inferior cerebellar artery non  hemorrhagic infarction embolic.  1. Confounding this is a 2-week history of symptoms that would fit in      this distribution, but an MRI scan that looks as if the lesions are      not more than 72 hours old.  2. Multiple risk factors as noted above.  3. Prior history of medical noncompliance.   PLAN:  1. MRI scan of the brain with contrast to help age the lesion.  2. MRA intracranial and extracranial to look for a right vertebral      clot.  3. 2-D echocardiogram.  He did not have cardiac echocardiogram last      time, cardiology will read this.  4. Transcranial Doppler.  5. Morning lipid panel, homocysteine, hemoglobin A1c, urine drug      screen and urinalysis. These were cancelled. We need to check again      to see if he still be showing a positive.  We will also obtain a      serum hypercoagulable panel.  He will continue aspirin, Lipitor and      Lopressor.  He may be switched to Aggrenox, but if he has not been      on aspirin regularly, there is no reason to do so.  He needs a      physical therapy and occupational therapy consult.  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      Deanna Artis. Sharene Skeans, [name]  Electronically Signed     [facility]/MEDQ  D:  12/04/2006  T:  12/05/2006  Job:  [id number]  cc:   Colleen Can. Deborah Chalk, [name]

## 2010-12-26 NOTE — Discharge Summary (Signed)
NAMEANTONY, [name]              ACCOUNT NO.:  [id number]   MEDICAL RECORD NO.:  [id number]          PATIENT TYPE:  INP   LOCATION:  3040                         FACILITY:  [facility]   PHYSICIAN:  Colleen Can. Deborah Chalk, M.D.DATE OF BIRTH:  08-Feb-1961   DATE OF ADMISSION:  12/04/2006  DATE OF DISCHARGE:  12/06/2006                               DISCHARGE SUMMARY   PRIMARY DISCHARGE DIAGNOSES:  1. Acute cerebellar infarcts.  Magnetic resonance imaging scanning      shows right posterior-inferior cerebellar artery nonhemorrhagic      infarction that is embolic in etiology.  2. Multiple risk factors.  3. Medical noncompliance.  4. Ongoing tobacco abuse.  5. Hypertension.  6. Known atherosclerotic cardiovascular disease.  7. Known dyslipidemia.   HISTORY OF PRESENT ILLNESS:  [name] is a 50 year old white male who  has multiple medical problems.  He has had a known history of  polysubstance abuse, coronary disease and dyslipidemia.  He was admitted  earlier in the month on April 13 through April 16 for evaluation of  chest pain.  The hospital course was basically unremarkable.  He had a  stress test which showed no evidence of inducible ischemia.  He did have  a drug screen that was positive for cocaine.  He has ongoing alcohol and  tobacco abuse.  During this time he did complain of weakness off and on  as well as numbness in the hands.  He had a CT scan of the head which  showed no acute abnormality.  A portion of his symptoms continued as an  outpatient.  He called the office and asked to have further evaluation.  An outpatient MRI was arranged which showed infarcts that were felt to  be acute.  He was subsequently brought back to the emergency room where  he was readmitted for further evaluation.   Please see the dictated history and physical per Dr. Lady Deutscher for  further patient presentation and profile.   LABORATORY DATA:  Lab work showed normal coags, normal BUN,  creatinine  of 0.8.  His LFTs were normal.  An MRI without contrast showed evidence  of an acute infarction involving the right posterior inferior cerebral  artery involving the right lateral medullary region as well as the  inferior cerebellar hemisphere.  The lesion rather than being confluent  looked more as if there had been a shower of emboli.  These lesions  appeared to be less than 72 hours old.   Carotid Doppler showed no significant left or right ICA stenosis.   HOSPITAL COURSE:  The patient was admitted.  Neurology consult was  carried out.  He was seen by Dr. Ellison Carwin.  Repeat urine drug  screen was performed which was negative for cocaine this time.  He was  continued on aspirin, Lipitor and Lopressor.  Physical therapy and  occupational therapy consults were carried out.  He did have smoking  cessation counseling.  By December 06, 2006 he was basically doing well.  He continued to have some unsteady gait, and plans were made to have  home therapy program.  It is felt that if he would fail he would likely  need an angio as an outpatient in the future.   DISCHARGE CONDITION:  Stable.   DISCHARGE DIET:  Low sodium.   He was strongly encouraged to stop smoking.   DISCHARGE MEDICINES:  1. Nicoderm CQ patches 14 mg daily for 2 weeks, then 7 mg daily for 6      weeks, then stop.  2. Aspirin 325 a day.  3. Lopressor 25 b.i.d.  4. Lipitor 40 mg a day.   He was reminded to not climb a ladder until he is 100% steady on the  ground.  Followup with Dr. Sharene [name] will be in 2 months and he will  continue with his routine cardiac evaluation and followup.      Sharlee Blew, N.P.      Colleen Can. Deborah Chalk, [name]  Electronically Signed    LC/MEDQ  D:  01/10/2007  T:  01/10/2007  Job:  [id number]   cc:   Kenneth Artis. [name], [name]
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
## 2010-12-26 NOTE — H&P (Signed)
NAMEZOEY, GILKESON NO.:  [id number]   MEDICAL RECORD NO.:  [id number]          PATIENT TYPE:  EMS   LOCATION:  MAJO                         FACILITY:  [facility]   PHYSICIAN:  Elmore Guise., M.D.DATE OF BIRTH:  02-Aug-1961   DATE OF ADMISSION:  11/21/2006  DATE OF DISCHARGE:                              HISTORY & PHYSICAL   s   PRIMARY CARE PHYSICIAN:  Colleen Can. Deborah Chalk, [name]   REASON FOR ADMISSION:  Chest pain.   HISTORY OF PRESENT ILLNESS:  Mr. Puebla is a pleasant 50 year old white  male, past medical history of coronary artery disease, status post PCI  of the circumflex back in 2003 and PCI of his LAD in 2002, hypertension,  tobacco and alcohol abuse, who presents for evaluation of substernal  chest pain and shortness of breath.  The patient actually was brought by  his son and daughter who had seen him today.  They both report that  their father complained of chest pressure and heaviness, increased  shortness of breath, had some nausea and vomiting today.  The patient  reports symptoms similar to his pain when he had his heart attack and  stents placed in the past.  His chest pain initially started two weeks  ago.  He was out at the store, started having some chest pressure.  This  continued for approximately two hours and then subsided.  He then was  able to go about his normal activities the next week to 10 days.  However, the last couple of days he has been having increased shortness  of breath with last night starting with chest heaviness which resolved  spontaneously and then returned again this afternoon.  He also reports  diffuse weakness and numbness which have been there for at least 2-3  weeks.  He denies any falls, but he states that he stumbles more than  he used to.  He does have long history of alcohol and tobacco use,  drinking up to a six pack and more per night.  Per report he has been  having difficulty also with grief from the  passing of a family member.  Denies any fever, does have occasional cough and snores loudly.  Also  notes occasional palpitations with my heart skipping.  In discussion  with son he does report that his dad shakes if he does not drink.  He  has gone through withdrawal in the past.  His current meds are none.   ALLERGIES:  Are none.   FAMILY HISTORY:  Positive for heart disease.   SOCIAL HISTORY:  He is divorced.  Heavy alcohol and tobacco use.  He  works as a Haematologist.  His last cath was done in 2003 which  showed left main coronary which was normal circumflex, had 70%  obstruction just prior to branching of an obtuse marginal.  This was  stenting successfully.  His LAD was moderate size vessel.  His stented  segment was without any significant stenosis.  However, just beyond the  stent the vessel bifurcates and there is approximately 50% narrowing at  the  bifurcation.  His RCA had scattered 20-30% narrowing.  His EF was  60%.   PHYSICAL EXAMINATION:  He is afebrile.  Blood pressure is now 120/70,  heart rate is 80. He is saturating 95% on room air.  GENERAL:  In general he is a very pleasant, middle-aged white male,  alert and oriented x4.  No acute distress.  He has no JVD and no bruits.  LUNGS:  Lungs are clear.[name]  HEART:  Heart is regular with no significant murmurs, gallops, or rubs.  ABDOMEN: Soft, nontender, nondistended.  EXTREMITIES:  Warm with 2+ pulses and no significant edema.  NEUROLOGIC:  Strength is 5/5 and equal bilaterally.  He does have a very  fine resting tremor.  He does report tingling in his upper and lower  extremities.  He was very weak on sitting up.  I did not ask him to  stand because of his weakness.   His blood work showed a white count 10.3, platelet count of 16.3.  Hemoglobin is 16.3, platelet count of 310.  Coags are normal.  He had a  BUN and creatinine of 12 and 0.8, potassium 4.6.  His cardiac markers  showed a myoglobin of 91 and 73  and MB of 1.8 and 1.9 and troponin I  less than 0.05 x2.   CHEST X-RAY:  Showed no acute cardiopulmonary disease.  His ECG shows  normal sinus rhythm, old anterior infarct.  No significant ST or T-wave  changes noted.   IMPRESSION:  1. Chest pain with atypical and typical qualities.  2. History of coronary artery disease, status post PCI of LAD in 2002      and PCI of the circumflex in 2003.  3. Alcohol and tobacco dependence.  4. Weakness likely secondary to his alcohol use.   PLAN:  1. From a cardiovascular standpoint he would be admitted to telemetry      monitor for rule out MI.  Serial cardiac enzymes will be performed.      We will also start aspirin.  He will get one dose of Lovenox and he      will have an echo checked in the morning.  2. For his alcohol withdrawal he will be under the alcohol withdrawal      protocol.  He will be given a banana bag tonight with daily folate      and thiamine.  We will also check a TSH and CT of the head tonight.      Will send for UDS and EtOH level.  3. Stress ulcer prophylaxis.  He will be given a PPI.  4. Further measures per Dr. Deborah Chalk.      Elmore Guise., [name]  Electronically Signed     TWK/MEDQ  D:  11/21/2006  T:  11/22/2006  Job:  [id number]

## 2010-12-26 NOTE — H&P (Signed)
[name]. [facility]  Patient:    [name], [name]                     MRN: [id number] Adm. Date:  [id number] Attending:  Eleanora [name] Dictator:   [name]. [name], R.N., A.N.P.                         History and Physical  CHIEF COMPLAINT:  Chest pain.  HISTORY OF PRESENT ILLNESS:  [name] is a 50 year old white male who reports that he has had previous cardiac problems in the past without cardiac catheterization.  He presented to the emergency room on December 07, 2000 per EMS They had been called in reference to an episode of chest pain.  He stated that he had had pain over the last week and that it had only been getting worse. The day of admission it had increased to its worst severity.  He was diaphoretic and somewhat tremulous.  A 12-lead electrocardiogram was performed in the field which showed ST elevation in the anterior leads.  He was subsequently taken to [facility] for further evaluation.  PAST MEDICAL HISTORY: 1. Hypertension, currently noncompliant with medicines. 2. Ongoing tobacco abuse.  ALLERGIES:  None known.  MEDICATIONS:  None known.  FAMILY HISTORY:  Positive for heart disease.  SOCIAL HISTORY:  He is single.  He lives at home with his fiancee and a total of four children (two his, two hers).  He has been a smoker over the past 27 years.  He denies other substance abuse.  REVIEW OF SYSTEMS:  Was unable to be obtained due to the emergent nature of the situation.  PHYSICAL EXAMINATION:  GENERAL:  He was a diaphoretic white male who was somewhat tremulous.  VITAL SIGNS:  Blood pressure 180/80 in the emergency room, heart rate was 100, respirations 24.  SKIN:  He was diaphoretic, color was somewhat pale.  LUNGS:  Coarse.  CARDIAC:  Showed a tachycardic rhythm.  ABDOMEN:  Soft.  EXTREMITIES:  Showed no evidence of edema.  LABORATORY DATA:  A 12-lead electrocardiogram confirmed anterior  myocardial infarction.  Other labs are pending.  OVERALL IMPRESSION: 1. Acute anterior myocardial infarction. 2. Hypertension, currently noncompliant with medicines.  He was taken emergently to the cardiac catheterization laboratory for further evaluation and possible intervention. DD:  12/09/00 TD:  12/09/00 Job: [id number]

## 2010-12-26 NOTE — Discharge Summary (Signed)
[place]. [facility]  Patient:    [name], [name]                     MRN: [id number] Adm. Date:  [id number] Disc. Date: [id number] Attending:  Eleanora Neighbor Dictator:   Jennet Maduro. Earl Gala, R.N., A.N.P.                           Discharge Summary  PRIMARY DISCHARGE DIAGNOSIS:  Acute anterior myocardial infarction with subsequent emergent cardiac catheterization with stent placement to the left anterior descending (3.0 x 15 mm Penta).  SECONDARY DISCHARGE DIAGNOSES: 1. Hypertension, previously noncompliant with medicines. 2. History of tobacco abuse.  HISTORY OF PRESENT ILLNESS:  [name] is a 50 year old white male who presented to the emergency room department on December 07, 2000, with worsening episodes of chest discomfort that had been occurring over the past week or so. On the day of admission, it increased to its worse severity and he was diaphoretic, as well as quite tremulous.  An EKG in the field confirmed anterior myocardial infarction.  He was subsequently brought to [facility] for further evaluation.  Please see the dictated history and physical for other patient presentation and profile.  LABORATORY DATA:  On admission, the hematocrit was 40 and white count 8. Chemistries were satisfactory.  Cardiac enzymes showed a peak CK-MB of 302. The first CK total was 2277 with an MB of 302.  The second total was 1521 with an MB of 185.4.  The chest x-ray showed no active disease.  HOSPITAL COURSE:  The patient was taken emergently to the cardiac catheterization lab by [facility] N. Deborah Chalk, [name], where he underwent coronary arteriogram, as well as percutaneous coronary intervention.  The left ventricle does demonstrate mild anterior hypokinesis with an ejection fraction estimated to be in the 40-45% range.  The left anterior descending had a 100% occlusion.  A subsequent stent was placed.  The left main was normal. The left  circumflex was a large vessel and had 20-40% irregularities.  The right coronary was dominant and had diffuse irregularities.  Post procedure, the patient was transferred to coronary care.  He received IV Integrilin.  He did have somewhat of a significant moderate to large hematoma of the right groin which stabilized quite nicely with conservative measures.  Initially during his hospitalization, he was quite adamant about his history of tobacco abuse.  Nicotine patches were given.  He was placed on antianxiety agents as well.  The remainder of his hospitalization course has basically remained uneventful. He has been started on low-dose beta blocker in order to help with tachycardia.  He is also tolerating low-dose ACE inhibitor.  Today on Dec 10, 2000, he is doing well without complaints.  He has been up and ambulatory without problems.  The groin shows resolving hematoma.  He is felt to be stable for discharge today.  DISCHARGE CONDITION:  Improved.  DISCHARGE MEDICATIONS: 1. He may pick over-the-counter nicotine patches. 2. Plavix 75 mg a day for 21 days. 3. Aspirin daily. 4. Toprol XL 50 mg a day. 5. Altace 2.5 mg daily. 6. Nitroglycerin p.r.n. chest pain.  ACTIVITY:  To be light.  He is not to drive.  He is not to engage in sexual intercourse.  He may walk 5-10 minutes twice a day.  DIET:  Low fat.  WOUND CARE:  He is to continue to keep an ice  pack to the groin as needed.  SPECIAL INSTRUCTIONS:  He is strongly encouraged not to resume smoking.  FOLLOW-UP:  We will initially plan on seeing him back in the office on Dec 22, 2000, at 10:30 a.m. or sooner if any problems arise. DD:  12/09/00 TD:  12/11/00 Job: [id number]

## 2010-12-26 NOTE — Discharge Summary (Signed)
[place]. [facility]  Patient:    [name], GUT Visit Number: [id number] MRN: [id number]          Service Type: MED Location: [phone] Attending Physician:  Eleanora Neighbor Dictated by:   Jennet Maduro Earl Gala, R.N., A.N.P. Admit Date:  11/14/2001 Discharge Date: 11/16/2001   CC:         Colleen Can. Deborah Chalk, [name]   Discharge Summary  PRIMARY DISCHARGE DIAGNOSIS:  Unstable angina with subsequent elective coronary angiography with subsequent stent placement to the left circumflex.  SECONDARY DISCHARGE DIAGNOSES: 1. Previous anterior myocardial infarction December 07, 2000 with stent    placement to the left anterior descending. 2. Ongoing tobacco abuse. 3. Hypertension. 4. Medical noncompliance.  HISTORY OF PRESENT ILLNESS:  The patient is a 50 year old male who has known coronary artery disease.  He had previous anterior myocardial infarction approximately one year ago.  He has ongoing tobacco as well as alcohol use. The patient presented to the emergency department with recurrent episodes of chest discomfort and was subsequently admitted for further evaluation.  Please see the dictated history and physical per Dr. Garnette Scheuermann for further patient presentation and profile.  LABORATORY DATA: ELECTROCARDIOGRAM:  On admission showed an old septal infarct.  There were no acute changes.  CHEST X-RAY:  Unremarkable.  CARDIAC ENZYMES:  All negative.  CBC AND CHEMISTRIES:  Satisfactory as well.  HOSPITAL COURSE:  The patient was admitted and did rule out for myocardial infarction.  He admitted to being medically noncompliant.  The patient had been off medications for quite some time.  He continued to have ongoing tobacco as well as alcohol use.  We proceeded on with cardiac catheterization the following day.  Ejection fraction was noted to be 40 to 45%. The left anterior descending with the previous stent was patent.  There was a 40% to 50%  narrowing after the stent at the bifurcation.  The right coronary artery has 20 to 30% irregularities.  The left circumflex had a 70% eccentric plaque, subsequent stent placement with a 9 mm times 3.5 Zeta stent was placed with an overall satisfactory result obtained. The left main is normal.  Postprocedure the patient was transferred to 6500 for further monitoring and evaluation.  Today on 11/16/01 he is doing well without complaints.  He is anxious to go home.  His groin is unremarkable.  Postoperative laboratory studies satisfactory.  All cardiac enzymes are negative.  He is felt to be a stable candidate for discharge today.  DISCHARGE MEDICATIONS: 1. Plavix 75 mg q.d. 2. Aspirin daily. 3. Toprol XL 25 mg q.d. 4. Altace 2.5 mg daily. 5. Paxil CR 25 mg daily. 6. Nitroglycerin p.r.n.  ACTIVITY:  His activity is to be progressive.  He may return to work in one week.  WOUND CARE: Patient is to place an ice pack to the groin as needed and to call our office for any problems.  FURTHER INSTRUCTIONS: Patient is strongly encouraged not to smoke.  FOLLOW UP:  We will plan to see the patient otherwise in the office in approximately 10 days and he is asked to call the office to schedule that appointment. Dictated by:   Jennet Maduro Earl Gala, R.N., A.N.P. Attending Physician:  Eleanora Neighbor DD:  11/16/01 TD:  11/16/01 Job: [id number]

## 2010-12-26 NOTE — H&P (Signed)
[name], [name] NO.:  [id number]   MEDICAL RECORD NO.:  [id number]          PATIENT TYPE:  INP   LOCATION:  1830                         FACILITY:  [facility]   PHYSICIAN:  Elmore Guise., M.D.DATE OF BIRTH:  [birth year]   DATE OF ADMISSION:  12/04/2006  DATE OF DISCHARGE:                              HISTORY & PHYSICAL   INDICATION FOR ADMISSION:  Acute cerebellar infarcts.   HISTORY OF PRESENT ILLNESS:  The patient is a 50 year old white male  with past medical history of polysubstance abuse, coronary artery  disease, dyslipidemia, who was recently admitted on 04/13 and discharged  on 04/16 for evaluation of chest pain.  His hospital course was  essentially unremarkable.  He had a stress test which showed no evidence  of inducible ischemia.  His cardiac markers were negative.  His UDS was  positive for cocaine.  During this time he did complain of weakness off  and on and numbness in his hands.  He had a CT of the head which showed  no acute abnormality. On outpatient followup he continued to have  difficult with disequilibrium, stumbling to the right, weakness and  numbness in his right arm and legs.  He went for an MRI today, was noted  to have right cerebellar and right glabella infarcts which were likely  acute. He is now seen in the emergency department and will be admitted  for further treatment.  He denies any current alcohol or cocaine use. He  does continue to smoke.  He has had no chest pain or shortness of  breath.   CURRENT MEDICATIONS:  His current medications are aspirin and Lopressor  and Prilosec.   ALLERGIES:  None.   FAMILY HISTORY:  Positive for heart disease.   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  He denies cocaine, does continue to smoke  tobacco.   PHYSICAL EXAMINATION:  VITAL SIGNS:  Blood pressure 120/70, heart rate  93, afebrile, SAT 97% on room air.  IN GENERAL:   He is a very pleasant, middle aged white male, alert and  oriented x 4, in no acute distress.  NECK:  He has no JVD, no bruits.  LUNGS:  Lungs have prolonged expiratory phase with occasional wheezes.  HEART:  Heart is regular with normal S1, S2.  ABDOMEN:  Abdomen is soft, nontender, nondistended.  EXTREMITIES:  Extremities have no edema.  NEUROLOGIC:  Neurologically, he is weak on the right upper and right  lower extremity with decreased sensation throughout his distal right  hand up to his arm and elbow area.  He has difficulty with alternating  and rapid movements of the right side.  He also has difficulty with fine  movements of his right side.   LABORATORY DATA:  His lab work shows normal coags, normal BUN and  creatinine of 8 and 0.7.  LFTs are normal.  CBC and UDS is pending.  MRI  did show an acute right cerebellar infarct.  Telemetry shows normal  sinus rhythm.   IMPRESSION:  1. Acute cerebellar infarct.  2. History of polysubstance abuse with current denial of alcohol and      cocaine use.  3. History of coronary artery disease and dyslipidemia.   PLAN:  1. The patient will be admitted.  2. Neuro consult was asked for.  3. He will have carotid duplex and transcranial Dopplers.  4. Will restart his Stain, continue his aspirin and metoprolol.  5. Further measures per neurology.      Elmore Guise., [name]  Electronically Signed     TWK/MEDQ  D:  12/04/2006  T:  12/04/2006  Job:  [id number]

## 2012-03-15 ENCOUNTER — Telehealth: Payer: Self-pay | Admitting: Cardiovascular Disease

## 2012-03-15 NOTE — Telephone Encounter (Signed)
Spoke with pt who is asking if we have list of his medications in chart. He states he has not been taking any and does not have old bottles. I told pt we would be able to look up old list of his medications in old records. He also reports he would like to discuss possibly getting a prescription for Viagra. I encouraged him to discuss with MD at office visit this Thursday.

## 2012-03-15 NOTE — Telephone Encounter (Signed)
Left message to call back  

## 2012-03-15 NOTE — Telephone Encounter (Signed)
New msg Pt has some questions before his appt on Thursday. Please call

## 2012-03-16 ENCOUNTER — Encounter: Payer: Self-pay | Admitting: *Deleted

## 2012-03-17 ENCOUNTER — Encounter: Payer: Self-pay | Admitting: Cardiovascular Disease

## 2012-03-17 ENCOUNTER — Ambulatory Visit (INDEPENDENT_AMBULATORY_CARE_PROVIDER_SITE_OTHER): Payer: Self-pay | Admitting: Cardiovascular Disease

## 2012-03-17 VITALS — BP 144/84 | HR 85 | Ht 71.0 in | Wt 195.0 lb

## 2012-03-17 DIAGNOSIS — N529 Male erectile dysfunction, unspecified: Secondary | ICD-10-CM

## 2012-03-17 DIAGNOSIS — I2510 Atherosclerotic heart disease of native coronary artery without angina pectoris: Secondary | ICD-10-CM

## 2012-03-17 MED ORDER — PRAVASTATIN SODIUM 40 MG PO TABS: 40.0000 mg | ORAL_TABLET | Freq: Every day | ORAL | Status: DC

## 2012-03-17 MED ORDER — TADALAFIL 10 MG PO TABS: 10.0000 mg | ORAL_TABLET | Freq: Every day | ORAL | Status: DC | PRN

## 2012-03-17 MED ORDER — HYDROCHLOROTHIAZIDE 25 MG PO TABS: 25.0000 mg | ORAL_TABLET | Freq: Every day | ORAL | Status: DC

## 2012-03-17 MED ORDER — ASPIRIN 81 MG PO TABS: 81.0000 mg | ORAL_TABLET | Freq: Every day | ORAL | Status: DC

## 2012-03-17 NOTE — Progress Notes (Signed)
History of Present Illness: 51 year old male with history of HTN, hyperlipidemia, tobacco abuse, former cocaine abuse and CAD  presenting for cardiac follow up. He had a inferolateral STEMI in March 2010 at which time his Circumflex was occluded and  was opened with a balloon. He was taken emergently to the OR for 3V CABG (LIMA to LAD, SVG to Circumflex, SVG to PDA).  given his diffuse multivessel disease. In November  2010 he presented with a NSTEMI and had bare metal stenting of the totally occluded saphenous vein graft to the obtuse marginal artery.   He is here today for follow up. He has missed all f/u appointments over the last 2.5 years. He continues to smoke, currently  1ppd.  No illicit drug use.  He has been active and working around the  house without any dyspnea or chest pain. He has not been taking any medications. He did not think he needed any meds. 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 He is here really to get Viagra or Cialis.   Primary Care Physician: None  Last Lipid Profile:  None recent, lost to f/u  Past Medical History  Diagnosis Date  . Hypertension   . History of tobacco abuse   . Myocardial infarction 12/07/2000    STEMI March 2010, angioplasty of Circumflex and emergent CABG November 07, 2008, bare metal stent to SVG to Circumflex with bare metal stent  November 2010.   [name] Chest pain   . Coronary artery disease   . Cerebrovascular disease   . Abnormality of gait   . Cocaine use     Past Surgical History  Procedure Date  . Cardiac catheterization 11/07/2008    EF of 50-55% -- normal LV systolic function, acute inferolateral ST elevation MI,  PTCA of the circumflex artery -- Colleen Can. Deborah Chalk, [name]  . Coronary artery bypass graft     Meds: Not.                                        No Known Allergies  History   Social History  . Marital Status: Single    Spouse Name: N/A    Number of Children: N/A  . Years of Education:  N/A   Occupational History  . Not on file.   Social History Main Topics  . Smoking status: Current Everyday Smoker -- 1.0 packs/day for 30 years    Types: Cigarettes  . Smokeless tobacco: Not on file  . Alcohol Use: Yes     beer on occasions  . Drug Use: No     former users  . Sexually Active: Not on file   Other Topics Concern  . Not on file   Social History Narrative  . No narrative on file    Family History  Problem Relation Age of Onset  . Heart disease Mother   . Heart failure Mother   . Diabetes Mother   . Prostate cancer      Review of Systems:  As stated in the HPI and otherwise negative.   BP 144/84  Pulse 85  Ht 5\' 11"  (1.803 m)  Wt 195 lb (88.451 kg)  BMI 27.20 kg/m2  SpO2 96%  Physical Examination: General: Well developed, well nourished, NAD HEENT: OP clear, mucus membranes moist SKIN: warm, dry. No rashes. Neuro: No focal deficits Musculoskeletal: Muscle strength 5/5 all  ext Psychiatric: Mood and affect normal Neck: No JVD, no carotid bruits, no thyromegaly, no lymphadenopathy. Lungs:Clear bilaterally, no wheezes, rhonci, crackles Cardiovascular: Regular rate and rhythm. No murmurs, gallops or rubs. Abdomen:Soft. Bowel sounds present. Non-tender.  Extremities: No lower extremity edema. Pulses are 2 + in the bilateral DP/PT.  EKG: NSR, NSSTTW changes.

## 2012-03-17 NOTE — Assessment & Plan Note (Signed)
Stable. No chest pains. He is s/p 3V CABG in March 2010 and has had bare metal stent to SVG to Circumflex in November 2010. He has stopped all cardiac meds and continues to smoke 1ppd. Will restart ASA 81 mg po QDaily, HCTZ 25 mg po QDaily, Pravastatin 40 mg po QHS. He is asked to stop smoking.

## 2012-03-17 NOTE — Patient Instructions (Addendum)
Your physician wants you to follow-up in: 6 months.  You will receive a reminder letter in the mail two months in advance. If you don't receive a letter, please call our office to schedule the follow-up appointment.    Your physician has recommended you make the following change in your medication: Start aspirin 81 mg by mouth daily, pravastatin 40 mg by mouth daily at bedtime, hydrochlorothiazide 25 mg by mouth daily.   Your physician recommends that you return for fasting lab work in: 6 months on day of appt with Dr. Larene Beach and Liver profile   Return completed assistance program paperwork to office.

## 2012-03-17 NOTE — Assessment & Plan Note (Signed)
Will give him paperwork for Cialis assistance program and free trial.

## 2012-04-06 ENCOUNTER — Telehealth: Payer: Self-pay | Admitting: *Deleted

## 2012-04-06 NOTE — Telephone Encounter (Signed)
Spoke with pt and gave him this information and told him I would call him when medication arrived.  Pt also left [employer] financial application. I told him I would follow up to see where this should be sent. Pt has not completed entire form and he will complete when he comes into office.

## 2012-04-06 NOTE — Telephone Encounter (Signed)
Left message on pt's identified voicemail that I would leave form at front desk for him to complete. Instructions on where to send completed application are attached.

## 2012-04-06 NOTE — Telephone Encounter (Signed)
Follow-up:    Patient returned your call.  Please call back. 

## 2012-04-06 NOTE — Telephone Encounter (Signed)
Received fax in office from [facility] that pt has been approved for Cialis through assistance program.  Pt will receive 30 tablets. This will be a 6 month supply. He will get 2 shipments total.  First shipment has been sent to office but has not arrived yet. He will need to notify us when he needs second shipment so it can be reordered.  I called pt to give him this information. Left message to call back

## 2012-04-08 NOTE — Telephone Encounter (Signed)
Spoke with pt and told him Cialis from [facility] had arrived and  would be at front desk along with financial application from [employer] [facility] for him to pick up.

## 2012-05-05 ENCOUNTER — Telehealth: Payer: Self-pay | Admitting: Cardiovascular Disease

## 2012-05-05 NOTE — Telephone Encounter (Signed)
Spoke with pt who reports he has taken Cialis daily for 30 days and it is not helping. He is asking about getting more tablets.  I told pt he was to take this daily as needed prior to sexual activity. He states he thought he was to take it everyday at the same time.  I told him I would review dose and assistance program application to make sure he received prn dose.  I told him I would call him back after reviewing.  I then called assistance program and reviewed application and confirmed pt received prn dosing.

## 2012-05-05 NOTE — Telephone Encounter (Signed)
I reviewed with Dr. Clifton James to make sure pt should not have side effects from taking Cialis daily. Pt states he is not having any problems other than Cialis not working.  Assistance program will not refill at this time    Pt will call me in 5 months to send in refill request. He will contact primary care to discuss possible referral to urology for ongoing erectile dysfunction.

## 2012-05-05 NOTE — Telephone Encounter (Signed)
Pt calling re pt assistance program , has questions

## 2012-07-19 ENCOUNTER — Telehealth: Payer: Self-pay | Admitting: Cardiovascular Disease

## 2012-07-19 NOTE — Telephone Encounter (Signed)
Spoke with pt who is asking if it is time to refill Cialis through assistance program.  I told pt he received medication at end of August and it can be refilled in February. He states he has been having trouble sleeping lately and that arms and legs go to sleep at times during night. He turns over and it gets better.  States he had similar symptoms in past prior to his heart surgery.  He is not having chest pain or shortness of breath.  I told pt I could schedule an appt with Dr. Clifton James if he would like but he wants to wait until planned 6 month follow up in February.  Appt made for September 15, 2012 at 8:45.  He is aware to fast after midnight day of this appt.

## 2012-07-19 NOTE — Telephone Encounter (Signed)
plz return call to pt [phone] regarding Cialis

## 2012-09-05 ENCOUNTER — Telehealth: Payer: Self-pay | Admitting: Cardiovascular Disease

## 2012-09-05 NOTE — Telephone Encounter (Signed)
Patient states Pat Dr. Gibson Ramp nurse has getting Cialis 10 mg medication in 6 moths increments from some company for  Pt. Pt  would like to have more because he is out of it now. Pt would prefer  to have Viagra if he can.

## 2012-09-05 NOTE — Telephone Encounter (Signed)
New problem:  Aware that nurse Dennie Bible is off today   No information was disclose  Will explain to nurse when she call.

## 2012-09-07 NOTE — Telephone Encounter (Signed)
Left message to call back  

## 2012-09-08 NOTE — Telephone Encounter (Signed)
I spoke with pt and he would like to refill Cialis through assistance program. Will send reorder in.

## 2012-09-08 NOTE — Telephone Encounter (Signed)
Follow-up: ° ° ° °Patient called returning your call. Please call back. °

## 2012-09-15 ENCOUNTER — Encounter: Payer: Self-pay | Admitting: Cardiovascular Disease

## 2012-09-15 ENCOUNTER — Ambulatory Visit (INDEPENDENT_AMBULATORY_CARE_PROVIDER_SITE_OTHER): Payer: Self-pay | Admitting: Cardiovascular Disease

## 2012-09-15 VITALS — BP 147/77 | HR 82 | Ht 71.0 in | Wt 204.0 lb

## 2012-09-15 DIAGNOSIS — I25810 Atherosclerosis of coronary artery bypass graft(s) without angina pectoris: Secondary | ICD-10-CM

## 2012-09-15 DIAGNOSIS — N529 Male erectile dysfunction, unspecified: Secondary | ICD-10-CM

## 2012-09-15 DIAGNOSIS — I10 Essential (primary) hypertension: Secondary | ICD-10-CM

## 2012-09-15 DIAGNOSIS — I2510 Atherosclerotic heart disease of native coronary artery without angina pectoris: Secondary | ICD-10-CM

## 2012-09-15 MED ORDER — PRAVASTATIN SODIUM 40 MG PO TABS: 40.0000 mg | ORAL_TABLET | Freq: Every day | ORAL | Status: DC

## 2012-09-15 MED ORDER — HYDROCHLOROTHIAZIDE 25 MG PO TABS: 25.0000 mg | ORAL_TABLET | Freq: Every day | ORAL | Status: DC

## 2012-09-15 NOTE — Patient Instructions (Addendum)
Your physician wants you to follow-up in: 6 months. You will receive a reminder letter in the mail two months in advance. If you don't receive a letter, please call our office to schedule the follow-up appointment.  Your physician recommends that you return for fasting lab work in:  12 weeks.--first week in May.  The lab opens at 7:30 AM every week day. Fasting lipid and liver profile

## 2012-09-15 NOTE — Progress Notes (Signed)
History of Present Illness: 52 year old male with history of HTN, hyperlipidemia, tobacco abuse, former cocaine abuse and CAD presenting for cardiac follow up. He had a inferolateral STEMI in March 2010 at which time his Circumflex was occluded and was opened with a balloon. He was taken emergently to the OR for 3V CABG (LIMA to LAD, SVG to Circumflex, SVG to PDA). given his diffuse multivessel disease. In November 2010 he presented with a NSTEMI and had bare metal stenting of the totally occluded saphenous vein graft to the obtuse marginal artery.   He is here today for follow up. He is feeling ok. No chest pain or SOB.  He continues to smoke, currently 1/2 ppd down from 2 ppd. He is trying to use the e-cig to stop smoking. No illicit drug use. He has been active and working around the house without any dyspnea or chest pain. He has not been taking any medications over the last two weeks.   Primary Care Physician: None   Last Lipid Profile: None recent, lost to f/u  Past Medical History  Diagnosis Date  . Hypertension   . History of tobacco abuse   . Myocardial infarction 12/07/2000    STEMI March 2010, angioplasty of Circumflex and emergent CABG November 07, 2008, bare metal stent to SVG to Circumflex with bare metal stent  November 2010.   [name] Chest pain   . Coronary artery disease   . Cerebrovascular disease   . Abnormality of gait   . Cocaine use     Past Surgical History  Procedure Date  . Cardiac catheterization 11/07/2008    EF of 50-55% -- normal LV systolic function, acute inferolateral ST elevation MI,  PTCA of the circumflex artery -- Colleen Can. Deborah Chalk, [name]  . Coronary artery bypass graft     Current Outpatient Prescriptions  Medication Sig Dispense Refill  . aspirin 81 MG tablet Take 1 tablet (81 mg total) by mouth daily.  30 tablet  0  . hydrochlorothiazide (HYDRODIURIL) 25 MG tablet Take 1 tablet (25 mg total) by mouth daily.  30 tablet  6  . pravastatin (PRAVACHOL) 40  MG tablet Take 1 tablet (40 mg total) by mouth daily.  30 tablet  6  . tadalafil (CIALIS) 10 MG tablet Take 1 tablet (10 mg total) by mouth daily as needed for erectile dysfunction.  10 tablet  1    No Known Allergies  History   Social History  . Marital Status: Single    Spouse Name: N/A    Number of Children: N/A  . Years of Education: N/A   Occupational History  . Not on file.   Social History Main Topics  . Smoking status: Current Every Day Smoker -- 1.0 packs/day for 30 years    Types: Cigarettes  . Smokeless tobacco: Not on file  . Alcohol Use: Yes     Comment: beer on occasions  . Drug Use: No     Comment: former users  . Sexually Active: Not on file   Other Topics Concern  . Not on file   Social History Narrative  . No narrative on file    Family History  Problem Relation Age of Onset  . Heart disease Mother   . Heart failure Mother   . Diabetes Mother   . Prostate cancer      Review of Systems:  As stated in the HPI and otherwise negative.   BP 147/77  Pulse 82  Ht 5'  11" (1.803 m)  Wt 204 lb (92.534 kg)  BMI 28.45 kg/m2  Physical Examination: General: Well developed, well nourished, NAD HEENT: OP clear, mucus membranes moist SKIN: warm, dry. No rashes. Neuro: No focal deficits Musculoskeletal: Muscle strength 5/5 all ext Psychiatric: Mood and affect normal Neck: No JVD, no carotid bruits, no thyromegaly, no lymphadenopathy. Lungs:Clear bilaterally, no wheezes, rhonci, crackles Cardiovascular: Regular rate and rhythm. No murmurs, gallops or rubs. Abdomen:Soft. Bowel sounds present. Non-tender.  Extremities: No lower extremity edema. Pulses are 2 + in the bilateral DP/PT.  Assessment and Plan:   1. CAD: Stable. No chest pains. He is s/p 3V CABG in March 2010 and has had bare metal stent to SVG to Circumflex in November 2010. He has once again stopped all cardiac meds and continues to smoke 1/2  Ppd.  Will restart ASA, HCTZ and statin  (Pravastatin 40 mg po QHS). He is asked to stop smoking once again.   2. Erectile dysfunction:  Continue Cialis (he is in the Cialis assistance program)  3. HTN: Will get him back on HCTZ 25 mg po QDaily.   4. Medication non-compliance: I have counseled him on the importance of compliance  5. Tobacco abuse: Cessation recommended.

## 2012-10-19 ENCOUNTER — Telehealth: Payer: Self-pay | Admitting: Cardiovascular Disease

## 2012-10-19 NOTE — Telephone Encounter (Signed)
New Problem:    Patient called in returning your call possibly about a medication.  Please call back.

## 2012-10-19 NOTE — Telephone Encounter (Signed)
Spoke with pt and told him Cialis was at front desk for him to pick up.  I explained to pt that he was to take it 30 minutes prior to sexual activity as needed. He verbalizes understanding of these instructions.

## 2012-10-19 NOTE — Telephone Encounter (Signed)
Pt.notified

## 2012-10-19 NOTE — Telephone Encounter (Signed)
Cialis received in office. I will leave at front desk for pt to pick up. I left message for pt to call back

## 2012-12-12 ENCOUNTER — Other Ambulatory Visit: Payer: Self-pay

## 2013-05-03 ENCOUNTER — Telehealth: Payer: Self-pay

## 2013-05-03 NOTE — Telephone Encounter (Signed)
HM-None-Patient has not had any vaccines, colonoscopy, PSA, etc Sees cardiologist-CAD, Hx of stroke, stents Meds reconciled, allergies and pharmacy reviewed Off pravastatin x 2 months No records to obtain for any other PCP

## 2013-05-04 ENCOUNTER — Ambulatory Visit: Payer: Self-pay | Admitting: Family Medicine

## 2013-05-05 ENCOUNTER — Encounter: Payer: Self-pay | Admitting: Internal Medicine

## 2013-05-05 ENCOUNTER — Ambulatory Visit (INDEPENDENT_AMBULATORY_CARE_PROVIDER_SITE_OTHER): Payer: BC Managed Care – PPO | Admitting: Internal Medicine

## 2013-05-05 VITALS — BP 139/80 | HR 58 | Temp 98.0°F | Ht 69.5 in | Wt 219.0 lb

## 2013-05-05 DIAGNOSIS — F329 Major depressive disorder, single episode, unspecified: Secondary | ICD-10-CM | POA: Insufficient documentation

## 2013-05-05 DIAGNOSIS — I10 Essential (primary) hypertension: Secondary | ICD-10-CM

## 2013-05-05 DIAGNOSIS — R399 Unspecified symptoms and signs involving the genitourinary system: Secondary | ICD-10-CM

## 2013-05-05 DIAGNOSIS — Z91199 Patient's noncompliance with other medical treatment and regimen due to unspecified reason: Secondary | ICD-10-CM | POA: Insufficient documentation

## 2013-05-05 DIAGNOSIS — E785 Hyperlipidemia, unspecified: Secondary | ICD-10-CM

## 2013-05-05 DIAGNOSIS — I25810 Atherosclerosis of coronary artery bypass graft(s) without angina pectoris: Secondary | ICD-10-CM

## 2013-05-05 DIAGNOSIS — F419 Anxiety disorder, unspecified: Secondary | ICD-10-CM | POA: Insufficient documentation

## 2013-05-05 DIAGNOSIS — G4733 Obstructive sleep apnea (adult) (pediatric): Secondary | ICD-10-CM | POA: Insufficient documentation

## 2013-05-05 DIAGNOSIS — Z9119 Patient's noncompliance with other medical treatment and regimen: Secondary | ICD-10-CM | POA: Insufficient documentation

## 2013-05-05 DIAGNOSIS — R3989 Other symptoms and signs involving the genitourinary system: Secondary | ICD-10-CM

## 2013-05-05 DIAGNOSIS — I2510 Atherosclerotic heart disease of native coronary artery without angina pectoris: Secondary | ICD-10-CM

## 2013-05-05 LAB — CBC WITH DIFFERENTIAL/PLATELET
Basophils Relative: 0.1 % (ref 0.0–3.0)
Eosinophils Relative: 1.2 % (ref 0.0–5.0)
Lymphocytes Relative: 19.6 % (ref 12.0–46.0)
Neutrophils Relative %: 65.2 % (ref 43.0–77.0)
Platelets: 276 10*3/uL (ref 150.0–400.0)
RBC: 4.36 Mil/uL (ref 4.22–5.81)
WBC: 7.6 10*3/uL (ref 4.5–10.5)

## 2013-05-05 LAB — LIPID PANEL
Cholesterol: 179 mg/dL (ref 0–200)
HDL: 43.4 mg/dL (ref >39.00)
LDL Cholesterol: 109 mg/dL — ABNORMAL HIGH (ref 0–99)
Total CHOL/HDL Ratio: 4
Triglycerides: 132 mg/dL (ref 0.0–149.0)

## 2013-05-05 LAB — COMPREHENSIVE METABOLIC PANEL
Albumin: 3.9 g/dL (ref 3.5–5.2)
BUN: 13 mg/dL (ref 6–23)
CO2: 27 mEq/L (ref 19–32)
Calcium: 9.1 mg/dL (ref 8.4–10.5)
Chloride: 103 mEq/L (ref 96–112)
GFR: 91.19 mL/min (ref >60.00)
Glucose, Bld: 87 mg/dL (ref 70–99)
Potassium: 4.2 mEq/L (ref 3.5–5.1)

## 2013-05-05 MED ORDER — TAMSULOSIN HCL 0.4 MG PO CAPS: 0.4000 mg | ORAL_CAPSULE | Freq: Every day | ORAL | Status: DC

## 2013-05-05 MED ORDER — METOPROLOL SUCCINATE 12.5 MG HALF TABLET: 12.5000 mg | ORAL_TABLET | Freq: Two times a day (BID) | ORAL | Status: DC

## 2013-05-05 MED ORDER — ISOSORBIDE MONONITRATE ER 30 MG PO TB24: 30.0000 mg | ORAL_TABLET | Freq: Every day | ORAL | Status: DC

## 2013-05-05 MED ORDER — METOPROLOL TARTRATE 25 MG PO TABS: 12.5000 mg | ORAL_TABLET | Freq: Two times a day (BID) | ORAL | Status: DC

## 2013-05-05 MED ORDER — NITROGLYCERIN 0.4 MG SL SUBL: 0.4000 mg | SUBLINGUAL_TABLET | SUBLINGUAL | Status: DC | PRN

## 2013-05-05 NOTE — Assessment & Plan Note (Signed)
Obese patient with fatigue and feeling a sleepy,refer to pulmonary, r/o OSA

## 2013-05-05 NOTE — Patient Instructions (Signed)
Metoprolol twice a day, Imdur once every morning, Flomax once at night. If you have chest pain, take nitroglycerin every 5 minutes x 3 times and call 911 because you will need to go to the hospital. Come back here in one month.

## 2013-05-05 NOTE — Assessment & Plan Note (Signed)
Poor compliance, labs

## 2013-05-05 NOTE — Assessment & Plan Note (Addendum)
BP okay today, reports he is a little higher at home. Labs Continue HCTZ Add low dose metoprolol

## 2013-05-05 NOTE — Assessment & Plan Note (Signed)
reasses on RTC

## 2013-05-05 NOTE — Assessment & Plan Note (Addendum)
New pt to me , presents with a number of symptoms, my main concern  is the  recent chest pain, EKG today normal sinus rhythm. The patient is not concerned about his symptoms " I have chest pain on-off". I discussed the case with the doc of the day at the cardiology office, we agreed to start Imdur, use nitroglycerin when necessary, they will see him soon. ER if symptoms severe. see. Instructions.

## 2013-05-05 NOTE — Assessment & Plan Note (Signed)
Start flomax

## 2013-05-05 NOTE — Progress Notes (Signed)
Subjective:    Patient ID: [name], male    DOB: April 30, 1961, 52 y.o.   MRN: [id number]  HPI New patient, has a number of issues:  History of heart disease, has chest pain or and off x years, last episode was last night and it was particularly severe: Had substernal tightness, lasted several hours, stopped  at around 5 AM. Was associated with some nausea and sweats, no vomiting. Did have some shortness or breath.  Feeling "rundown, burn out", When asked admits that is a emotional and physical feeling. Under a lot of stress at work, admits to feeling depressed but not suicidal. Also feeling very sleepy, last week was driving to [facility] and had to stop a couple of times because he was getting sleepy.  Complained of nocturia, needs to urinate once every hour and nighttime. Denies problems with them anytime.  Umbilical hernia for one year, getting slightly bigger, hurts from time to time  History of high cholesterol, self discontinued Pravachol because he was feeling weird  Past Medical History  Diagnosis Date  . Hypertension   . History of tobacco abuse     still smokes  . Myocardial infarction 12/07/2000    STEMI March 2010, angioplasty of Circumflex and emergent CABG November 07, 2008, bare metal stent to SVG to Circumflex with bare metal stent  November 2010.   [name] Chest pain   . Coronary artery disease   . Cerebrovascular disease   . Abnormality of gait   . Cocaine use     denies current use    Past Surgical History  Procedure Laterality Date  . Cardiac catheterization  11/07/2008    EF of 50-55% -- normal LV systolic function, acute inferolateral ST elevation MI,  PTCA of the circumflex artery -- Colleen Can. Deborah Chalk, [name]  . Coronary artery bypass graft     History   Social History  . Marital Status: Single    Spouse Name: N/A    Number of Children: 2  . Years of Education: N/A   Occupational History  . 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 Smoking status: Current Every Day Smoker -- 1.00 packs/day for 30 years    Types: Cigarettes  . Smokeless tobacco: Never Used     Comment: pt also uses   electric cigarettes  . Alcohol Use: Yes     Comment: beer on occasions  . Drug Use: No     Comment: former users  . Sexual Activity: Not on file   Other Topics Concern  . Not on file   Social History Narrative   Lives w/ girlfriend    Family History  Problem Relation Age of Onset  . Heart disease Mother   . Heart failure Mother   . Diabetes Mother   . Prostate cancer Neg Hx   . Colon cancer Neg Hx      Review of Systems Denies nausea, vomiting, diarrhea. No blood in the stools. Denies dysuria, gross hematuria. No actual difficulty urinating See history of present illness.     Objective:   Physical Exam BP 139/80  Pulse 58  Temp(Src) 98 F (36.7 C)  Ht 5' 9.5" (1.765 m)  Wt 219 lb (99.338 kg)  BMI 31.89 kg/m2  SpO2 99% General -- alert, well-developed, NAD.   Lungs -- normal respiratory effort, no intercostal retractions, no accessory muscle use, and normal breath sounds.  Heart-- normal rate, regular rhythm, no murmur.  Abdomen-- +central obesity. Not distended, good bowel sounds,soft, non-tender. 1 inch umbilical hernia, slt tender, seems to have some fat content Rectal-- + external skin tags  Normal sphincter tone. No rectal masses or tenderness. Brown stools  Prostate--Prostate gland firm and smooth,  slt  Enlargement but no nodularity, tenderness, mass, asymmetry or induration. Extremities-- no pretibial edema bilaterally  Neurologic--  alert & oriented X3. Speech normal  Psych-- Cognition and judgment appear intact. Cooperative with normal attention span and concentration. No anxious appearing , no depressed appearing.       Assessment & Plan:   Today , I spent more than 50  min with the patient, >50% of the time counseling, reviewing the chart and labs ordered by  other providers , coordinating his care and assessing ~ 7 issues

## 2013-05-06 ENCOUNTER — Encounter: Payer: Self-pay | Admitting: Internal Medicine

## 2013-05-06 NOTE — Assessment & Plan Note (Signed)
New patient to me, he has a number of medical issues, he will need several interventions in the outpatient setting, poor compliance with followups and medications may be a barrier for his care

## 2013-05-11 ENCOUNTER — Telehealth: Payer: Self-pay | Admitting: Cardiovascular Disease

## 2013-05-11 NOTE — Telephone Encounter (Signed)
I spoke with pt who feels that his anginal symptoms are getting worse. Increased shortness of breath, "feeling drained and no energy. I am tired just walking to the car" Pt stated that Dr. Clifton James wanted to know if he became worse. His symptoms have worsened over the past 2 weeks.  He has not used any NTG at all.  Pt is at work here in [place].  He did not take his Isosorbide this morning & he does NOT have any NTG with him. He does not want to go to the ED but will if absolutely necessary. "You can't schedule a heart attack. I want to wait until Monday"   I offered to call NTG in close to his place of work. Pt will contact girlfriend to bring NTG. Advised him to call EMS if worsens but pt states he will contact girlfriend to bring NTG. "She will probably make me go to the hospital"  I will forward to Dr. Carver Fila RN

## 2013-05-11 NOTE — Telephone Encounter (Signed)
Agree. He should come to ED if he is having more chest pain. I will forward to [facility] and we can put him on the office schedule soon if he chooses not to come to the ED. [name]

## 2013-05-11 NOTE — Telephone Encounter (Signed)
New Problem  Pt states that he is having heart issues again. SOB, Numbness, loss of energy, "Weird" feelings. Believes that he is due for another stent.  Request a call back to discuss further.

## 2013-05-12 ENCOUNTER — Ambulatory Visit (INDEPENDENT_AMBULATORY_CARE_PROVIDER_SITE_OTHER): Payer: BC Managed Care – PPO | Admitting: Nurse Practitioner

## 2013-05-12 ENCOUNTER — Encounter: Payer: Self-pay | Admitting: Nurse Practitioner

## 2013-05-12 VITALS — BP 138/80 | HR 90 | Ht 69.5 in | Wt 216.0 lb

## 2013-05-12 DIAGNOSIS — I25810 Atherosclerosis of coronary artery bypass graft(s) without angina pectoris: Secondary | ICD-10-CM

## 2013-05-12 LAB — CBC WITH DIFFERENTIAL/PLATELET
Basophils Absolute: 0 10*3/uL (ref 0.0–0.1)
Basophils Relative: 0.1 % (ref 0.0–3.0)
Eosinophils Absolute: 0.1 10*3/uL (ref 0.0–0.7)
Eosinophils Relative: 0.7 % (ref 0.0–5.0)
HCT: 33.9 % — ABNORMAL LOW (ref 39.0–52.0)
Hemoglobin: 11 g/dL — ABNORMAL LOW (ref 13.0–17.0)
Lymphocytes Relative: 21.3 % (ref 12.0–46.0)
Lymphs Abs: 1.7 10*3/uL (ref 0.7–4.0)
MCHC: 32.5 g/dL (ref 30.0–36.0)
MCV: 76.9 fl — ABNORMAL LOW (ref 78.0–100.0)
Monocytes Absolute: 1.2 10*3/uL — ABNORMAL HIGH (ref 0.1–1.0)
Monocytes Relative: 14.3 % — ABNORMAL HIGH (ref 3.0–12.0)
Neutro Abs: 5.2 10*3/uL (ref 1.4–7.7)
Neutrophils Relative %: 63.6 % (ref 43.0–77.0)
Platelets: 271 10*3/uL (ref 150.0–400.0)
RBC: 4.41 Mil/uL (ref 4.22–5.81)
RDW: 21.1 % — ABNORMAL HIGH (ref 11.5–14.6)
WBC: 8.1 10*3/uL (ref 4.5–10.5)

## 2013-05-12 LAB — LIPID PANEL
Cholesterol: 179 mg/dL (ref 0–200)
HDL: 36.1 mg/dL — ABNORMAL LOW (ref >39.00)
Total CHOL/HDL Ratio: 5
Triglycerides: 214 mg/dL — ABNORMAL HIGH (ref 0.0–149.0)
VLDL: 42.8 mg/dL — ABNORMAL HIGH (ref 0.0–40.0)

## 2013-05-12 LAB — PROTIME-INR
INR: 1.1 ratio — ABNORMAL HIGH (ref 0.8–1.0)
Prothrombin Time: 11.5 s (ref 10.2–12.4)

## 2013-05-12 LAB — HEPATIC FUNCTION PANEL
ALT: 16 U/L (ref 0–53)
AST: 16 U/L (ref 0–37)
Albumin: 3.8 g/dL (ref 3.5–5.2)
Alkaline Phosphatase: 71 U/L (ref 39–117)
Bilirubin, Direct: 0.1 mg/dL (ref 0.0–0.3)
Total Bilirubin: 0.6 mg/dL (ref 0.3–1.2)
Total Protein: 7.5 g/dL (ref 6.0–8.3)

## 2013-05-12 LAB — BASIC METABOLIC PANEL
BUN: 12 mg/dL (ref 6–23)
CO2: 29 mEq/L (ref 19–32)
Calcium: 8.7 mg/dL (ref 8.4–10.5)
Chloride: 101 mEq/L (ref 96–112)
Creatinine, Ser: 0.9 mg/dL (ref 0.4–1.5)
GFR: 94.7 mL/min (ref >60.00)
Glucose, Bld: 83 mg/dL (ref 70–99)
Potassium: 4 mEq/L (ref 3.5–5.1)
Sodium: 135 mEq/L (ref 135–145)

## 2013-05-12 LAB — APTT: aPTT: 27.9 s (ref 21.7–28.8)

## 2013-05-12 NOTE — Patient Instructions (Addendum)
Stay on the aspirin and take the Imdur that you picked up today  We will need to repeat your labs today  We need to arrange for a heart catheterization on Monday with Dr. Clifton James  Use your NTG under your tongue for recurrent chest pain. May take one tablet every 5 minutes. If you are still having discomfort after 3 tablets in 15 minutes, call 911.  Try to not smoke this weekend  You are scheduled for a cardiac catheterization on Monday, October 6th with Dr. Clifton James or associate.  Go to [facility] 2nd Floor Short Stay on Monday, October 6th  at 8:30 m.  Enter thru the [employer] entrance A No food or drink after midnight on Sunday . You may take your medications with a sip of water on the day of your procedure.   You will need someone to drive you home.  Coronary Angiography Coronary angiography is an X-ray procedure used to look at the arteries in the heart. In this procedure, a dye is injected through a long, hollow tube (catheter). The catheter is about the size of a piece of cooked spaghetti. The catheter injects a dye into an artery in your groin. X-rays are then taken to show if there is a blockage in the arteries of your heart. BEFORE THE PROCEDURE   Let your caregiver know if you have allergies to shellfish or contrast dye. Also let your caregiver know if you have kidney problems or failure.  Do not eat or drink starting from midnight up to the time of the procedure, or as directed.  You may drink enough water to take your medications the morning of the procedure if you were instructed to do so.  You should be at the hospital or outpatient facility where the procedure is to be done 60 minutes prior to the procedure or as directed. PROCEDURE  You may be given an IV medication to help you relax before the procedure.  You will be prepared for the procedure by washing and shaving the area where the catheter will be inserted. This is usually done in the groin but may be done  in the fold of your arm by your elbow.  A medicine will be given to numb your groin where the catheter will be inserted.  A specially trained doctor will insert the catheter into an artery in your groin. The catheter is guided by using a special type of X-ray (fluoroscopy) to the blood vessel being examined.  A special dye is then injected into the catheter and X-rays are taken. The dye helps to show where any narrowing or blockages are located in the heart arteries. AFTER THE PROCEDURE   After the procedure you will be kept in bed lying flat for several hours. You will be instructed to not bend or cross your legs.  The groin insertion site will be watched and checked frequently.  The pulse in your feet will be checked frequently.  Additional blood tests, X-rays and an EKG may be done.  You may stay in the hospital overnight for observation. SEEK IMMEDIATE MEDICAL CARE IF:   You develop chest pain, shortness of breath, feel faint, or pass out.  There is bleeding, swelling, or drainage from the catheter insertion site.  You develop pain, discoloration, coldness, or severe bruising in the leg or area where the catheter was inserted.  You have a fever. Document Released: 01/31/2003 Document Revised: 10/19/2011 Document Reviewed: 03/21/2008 [facility] Patient Information 2014 [place], [place].

## 2013-05-12 NOTE — Progress Notes (Signed)
[name] Date of Birth: 24-Aug-1960 Medical Record #[id number]  History of Present Illness: [name] is seen back today for a work in visit. Seen for Dr. Clifton James. He has previously seen Dr. Deborah Chalk in the remote past. He has extensive CAD, ongoing tobacco abuse, past cocaine abuse, HTN and HLD. Had an anterior MI in April of 2002 treated with stenting of the LAD, stenting of the LCX in April of 2003 for recurrent symptoms, an inferolateral STEMI in March of 2010 and had balloon PCI to the LCX - followed by emergent CABG with LIMA to LAD, SVG to LCX, SVG to PDA per Dr. Tyrone Sage. He has had a NSTEMI back in November of 2010 and had BMS of the SVG to the OM. Has had past stroke along with longstanding noncompliance.   He was last seen here in February - was without symptoms but had stopped all of his medicines. ASA, HCTZ and statin restarted. He was on Cialis for ED.   Saw a new PCP earlier this week - reported return of chest pain - EKG was ok. Imdur was started but did not sound like he took it. Had no NTG. Called yesterday with fatigue, and worsening angina. Advised to be seen - thus added to my schedule today.   Comes in today. Here alone. Has not been doing well for the past 2 to 3 months. More angina, more shortness of breath. More fatigue. Lots of stress with work. Having symptoms with and without exertion. Can't even get up a flight of steps. Still smoking - using vapor cigarette. Not taking any of his prescribed medicines - only aspirin - says his medicines make him feel bad/dizzy. Just got his Imdur filled today - has not started yet. Taking NTG now every day - about once a day - with short lived results.    Current Outpatient Prescriptions  Medication Sig Dispense Refill  . aspirin 81 MG tablet Take 1 tablet (81 mg total) by mouth daily.  30 tablet  0  . hydrochlorothiazide (HYDRODIURIL) 25 MG tablet Take 1 tablet (25 mg total) by mouth daily.  30 tablet  6  . isosorbide  mononitrate (IMDUR) 30 MG 24 hr tablet Take 1 tablet (30 mg total) by mouth daily.  30 tablet  6  . metoprolol tartrate (LOPRESSOR) 25 MG tablet Take 0.5 tablets (12.5 mg total) by mouth 2 (two) times daily.  30 tablet  3  . nitroGLYCERIN (NITROSTAT) 0.4 MG SL tablet Place 1 tablet (0.4 mg total) under the tongue every 5 (five) minutes as needed for chest pain.  20 tablet  1  . tamsulosin (FLOMAX) 0.4 MG CAPS capsule Take 1 capsule (0.4 mg total) by mouth daily after supper.  30 capsule  1   No current facility-administered medications for this visit.    No Known Allergies  Past Medical History  Diagnosis Date  . Hypertension   . History of tobacco abuse     still smokes  . Myocardial infarction 12/07/2000    STEMI March 2010, angioplasty of Circumflex and emergent CABG November 07, 2008, bare metal stent to SVG to Circumflex with bare metal stent  November 2010.   [name] Chest pain   . Coronary artery disease   . Cerebrovascular disease   . Abnormality of gait   . Cocaine use     denies current use     Past Surgical History  Procedure Laterality Date  . Cardiac catheterization  11/07/2008  EF of 50-55% -- normal LV systolic function, acute inferolateral ST elevation MI,  PTCA of the circumflex artery -- Colleen Can. Deborah Chalk, [name]  . Coronary artery bypass graft      History  Smoking status  . Current Every Day Smoker -- 1.00 packs/day for 30 years  . Types: Cigarettes  Smokeless tobacco  . Never Used    Comment: pt also uses   electric cigarettes    History  Alcohol Use  . Yes    Comment: beer on occasions    Family History  Problem Relation Age of Onset  . Heart disease Mother   . Heart failure Mother   . Diabetes Mother   . Prostate cancer Neg Hx   . Colon cancer Neg Hx     Review of Systems: The review of systems is per the HPI.  All other systems were reviewed and are negative.  Physical Exam: BP 138/80  Pulse 90  Ht 5' 9.5" (1.765 m)  Wt 216 lb (97.977 kg)   BMI 31.45 kg/m2 Patient is alert and in no acute distress. Looks older than his stated age. Skin is warm and dry. Color is normal.  HEENT is unremarkable. Normocephalic/atraumatic. PERRL. Sclera are nonicteric. Neck is supple. No masses. No JVD. Lungs are coarse. Cardiac exam shows a regular rate and rhythm. Abdomen is soft. Extremities are without edema. Gait and ROM are intact. No gross neurologic deficits noted.  LABORATORY DATA:  EKG today shows sinus rhythm with nonspecific ST/T wave changes  Lab Results  Component Value Date   WBC 7.6 05/05/2013   HGB 10.8* 05/05/2013   HCT 33.8* 05/05/2013   PLT 276.0 05/05/2013   GLUCOSE 87 05/05/2013   CHOL 179 05/05/2013   TRIG 132.0 05/05/2013   HDL 43.40 05/05/2013   LDLDIRECT 105.0 11/04/2009   LDLCALC 109* 05/05/2013   ALT 16 05/05/2013   AST 22 05/05/2013   NA 135 05/05/2013   K 4.2 05/05/2013   CL 103 05/05/2013   CREATININE 0.9 05/05/2013   BUN 13 05/05/2013   CO2 27 05/05/2013   TSH 1.88 05/05/2013   INR 1.3 11/07/2008    Assessment / Plan: 1. Chest pain - known CAD - ongoing substance abuse - symptoms sound cardiac - having spells with and without exertion - refused to be admitted today - says he will show up on Monday - will talked with Dr. Clifton James who is in agreement with my plan - patient has refused admission for today. Cardiac cath arranged for Monday with Dr. Clifton James. Rechecking labs today.   2.HTN - off all of his medicines.   3. HLD - off all of his medicines.   Patient is agreeable to this plan and will call if any problems develop in the interim.   Rosalio Macadamia, RN, ANP-C [facility] [address] [place], [place]  [address]

## 2013-05-12 NOTE — Telephone Encounter (Signed)
Spoke with pt. He reports he used NTG yesterday. Pain relieved after 2 NTG. No pain since. Does report being very sluggish with no energy. Appt made for him to see Norma Fredrickson, NP at 2:30 today. He is aware if symptoms worsen or he has continuing chest pain prior to this appt he should go to ED to be evaluated.

## 2013-05-15 ENCOUNTER — Encounter (HOSPITAL_COMMUNITY): Payer: Self-pay | Admitting: Pharmacy Technician

## 2013-05-15 ENCOUNTER — Encounter (HOSPITAL_COMMUNITY)
Admission: RE | Disposition: A | Discharge status: Home / Self Care | Payer: Self-pay | Source: Ambulatory Visit | Attending: Cardiovascular Disease

## 2013-05-15 ENCOUNTER — Observation Stay (HOSPITAL_COMMUNITY)
Admission: RE | Admit: 2013-05-15 | Discharge: 2013-05-15 | Disposition: A | Discharge status: Home / Self Care | Payer: BC Managed Care – PPO | Source: Ambulatory Visit | Attending: Cardiovascular Disease | Admitting: Cardiovascular Disease

## 2013-05-15 DIAGNOSIS — I10 Essential (primary) hypertension: Secondary | ICD-10-CM | POA: Insufficient documentation

## 2013-05-15 DIAGNOSIS — R0989 Other specified symptoms and signs involving the circulatory and respiratory systems: Secondary | ICD-10-CM | POA: Insufficient documentation

## 2013-05-15 DIAGNOSIS — Z9861 Coronary angioplasty status: Secondary | ICD-10-CM | POA: Insufficient documentation

## 2013-05-15 DIAGNOSIS — R0609 Other forms of dyspnea: Secondary | ICD-10-CM | POA: Insufficient documentation

## 2013-05-15 DIAGNOSIS — Z91199 Patient's noncompliance with other medical treatment and regimen due to unspecified reason: Secondary | ICD-10-CM | POA: Insufficient documentation

## 2013-05-15 DIAGNOSIS — I252 Old myocardial infarction: Secondary | ICD-10-CM | POA: Insufficient documentation

## 2013-05-15 DIAGNOSIS — I2510 Atherosclerotic heart disease of native coronary artery without angina pectoris: Secondary | ICD-10-CM

## 2013-05-15 DIAGNOSIS — Z9119 Patient's noncompliance with other medical treatment and regimen: Secondary | ICD-10-CM | POA: Insufficient documentation

## 2013-05-15 DIAGNOSIS — F17200 Nicotine dependence, unspecified, uncomplicated: Secondary | ICD-10-CM | POA: Insufficient documentation

## 2013-05-15 DIAGNOSIS — R079 Chest pain, unspecified: Principal | ICD-10-CM | POA: Insufficient documentation

## 2013-05-15 DIAGNOSIS — Z951 Presence of aortocoronary bypass graft: Secondary | ICD-10-CM | POA: Insufficient documentation

## 2013-05-15 DIAGNOSIS — E785 Hyperlipidemia, unspecified: Secondary | ICD-10-CM | POA: Insufficient documentation

## 2013-05-15 HISTORY — PX: LEFT HEART CATHETERIZATION WITH CORONARY/GRAFT ANGIOGRAM: SHX5450

## 2013-05-15 LAB — LDL CHOLESTEROL, DIRECT: Direct LDL: 116 mg/dL

## 2013-05-15 SURGERY — LEFT HEART CATHETERIZATION WITH CORONARY/GRAFT ANGIOGRAM
Anesthesia: LOCAL

## 2013-05-15 MED ORDER — NITROGLYCERIN 0.4 MG SL SUBL
0.4000 mg | SUBLINGUAL_TABLET | SUBLINGUAL | Status: DC | PRN
  Filled 2013-05-15: qty 25

## 2013-05-15 MED ORDER — ACETAMINOPHEN 325 MG PO TABS: 650.0000 mg | ORAL_TABLET | ORAL | Status: DC | PRN

## 2013-05-15 MED ORDER — NITROGLYCERIN 0.4 MG/SPRAY TL SOLN
Status: AC
  Filled 2013-05-15: qty 4.9

## 2013-05-15 MED ORDER — TAMSULOSIN HCL 0.4 MG PO CAPS: 0.4000 mg | ORAL_CAPSULE | Freq: Every day | ORAL | Status: DC

## 2013-05-15 MED ORDER — FENTANYL CITRATE 0.05 MG/ML IJ SOLN
INTRAMUSCULAR | Status: AC
  Filled 2013-05-15: qty 2

## 2013-05-15 MED ORDER — LIDOCAINE HCL (PF) 1 % IJ SOLN
INTRAMUSCULAR | Status: AC
  Filled 2013-05-15: qty 30

## 2013-05-15 MED ORDER — ONDANSETRON HCL 4 MG/2ML IJ SOLN: 4.0000 mg | Freq: Four times a day (QID) | INTRAMUSCULAR | Status: DC | PRN

## 2013-05-15 MED ORDER — METOPROLOL TARTRATE 25 MG PO TABS: 25.0000 mg | ORAL_TABLET | Freq: Two times a day (BID) | ORAL | Status: DC

## 2013-05-15 MED ORDER — ISOSORBIDE MONONITRATE ER 30 MG PO TB24: 30.0000 mg | ORAL_TABLET | Freq: Every day | ORAL | Status: DC

## 2013-05-15 MED ORDER — NITROGLYCERIN 0.4 MG SL SUBL: 0.4000 mg | SUBLINGUAL_TABLET | SUBLINGUAL | Status: DC | PRN

## 2013-05-15 MED ORDER — MORPHINE SULFATE 10 MG/ML IJ SOLN: 2.0000 mg | INTRAMUSCULAR | Status: DC | PRN

## 2013-05-15 MED ORDER — ZOLPIDEM TARTRATE 5 MG PO TABS: 5.0000 mg | ORAL_TABLET | Freq: Every evening | ORAL | Status: DC | PRN

## 2013-05-15 MED ORDER — TRAMADOL HCL 50 MG PO TABS
50.0000 mg | ORAL_TABLET | Freq: Once | ORAL | Status: AC
  Administered 2013-05-15: 50 mg via ORAL
  Filled 2013-05-15: qty 1

## 2013-05-15 MED ORDER — MORPHINE SULFATE 2 MG/ML IJ SOLN
INTRAMUSCULAR | Status: AC
  Administered 2013-05-15: 2 mg via INTRAVENOUS
  Filled 2013-05-15: qty 1

## 2013-05-15 MED ORDER — SODIUM CHLORIDE 0.9 % IJ SOLN: 3.0000 mL | INTRAMUSCULAR | Status: DC | PRN

## 2013-05-15 MED ORDER — SODIUM CHLORIDE 0.9 % IV SOLN: 250.0000 mL | INTRAVENOUS | Status: DC | PRN

## 2013-05-15 MED ORDER — MIDAZOLAM HCL 5 MG/5ML IJ SOLN
INTRAMUSCULAR | Status: AC
  Filled 2013-05-15: qty 5

## 2013-05-15 MED ORDER — HEPARIN (PORCINE) IN NACL 2-0.9 UNIT/ML-% IJ SOLN
INTRAMUSCULAR | Status: AC
  Filled 2013-05-15: qty 1000

## 2013-05-15 MED ORDER — NITROGLYCERIN 0.2 MG/ML ON CALL CATH LAB
INTRAVENOUS | Status: AC
  Filled 2013-05-15: qty 1

## 2013-05-15 MED ORDER — SODIUM CHLORIDE 0.9 % IV SOLN: INTRAVENOUS | Status: AC

## 2013-05-15 MED ORDER — SODIUM CHLORIDE 0.9 % IJ SOLN: 3.0000 mL | Freq: Two times a day (BID) | INTRAMUSCULAR | Status: DC

## 2013-05-15 MED ORDER — ASPIRIN 81 MG PO CHEW: 81.0000 mg | CHEWABLE_TABLET | Freq: Every day | ORAL | Status: DC

## 2013-05-15 NOTE — Progress Notes (Signed)
CLIENT STATES PAIN PILL WILL NOT HELP AND HE "WILL CALL 911 IF CAN'T GET ANY HELP IN THE HOSPITAL" CHRIS BERGE,NP AND DR MCALHANY NOTIFIED AND CHRIS BERGE WILL BE IN TO SEE CLIENT

## 2013-05-15 NOTE — Progress Notes (Signed)
    S:  Pt presented today to short stay for scheduled outpt cath.  Upon arrival, he reported 7/10 chest pain and dyspnea.  He was taken from short stay to the cath lab holding area.  Currently, he is in no acute distress and reports 6-7/10 chest pain.    O:  SpO2 94% on RA.  BP 125/83. Pleasant, nad, aaox 3, lungs cta, cor rrr, no worsening of c/p with palpation.  abd soft, nt/nd/bs+x4, ext no cce, dp/pt 1+ bilat.  A/P: 1. USA/CAD:  See clinic note from last week for complete details.  Progressive rest and exertional angina over the past few wks/months.  Scheduled for diagnostic cath today.  Currently reporting 6-7/10 chest pain, though appears comfortable, conversing easily, smiling.  Will give ntg in cath lab holding and get stat 12 lead.  Next case cath.  Nicolasa Ducking, NP

## 2013-05-15 NOTE — Progress Notes (Signed)
REPORT CALLED TO RN FOR [phone] AND TRANSFERRED VIA WHEELCHAIR

## 2013-05-15 NOTE — Progress Notes (Signed)
    S: Pt is now s/p cath.  During bedrest, he c/o cramping in his right leg.  After ambulating, this discomfort worsened and he went back to bed.  I was called by RN b/c pt c/o excruciating R groin pain.  O:   Filed Vitals:   05/15/13 1545  BP: 114/68  Pulse: 65  Temp:   Pleasant, c/o R groin pain.  Somewhat restless.  AAOx3.  R groin is soft and mostly nontender except when palpation performed directly over needle-stick site.  No flank tenderness.  No bleeding, hematoma, or bruit.  Distal pulses 1+ bilat.  A/P: 1.  R groin pain: s/p r femoral stick/cath.  Exam is nl with exception of tenderness upon palpation directly over stick site.  Prn ultram and morphine initiated.  Will observe tonight.  If pain persists into the morning, will check R groin u/s.  2.  CAD/USA: s/p cath revealing 3/3 patent grafts.  Resume home meds as Rx.  3.  HTN:  Stable.  4.  Tob Abuse:  Will need cessation counseling.  Nicolasa Ducking, NP

## 2013-05-15 NOTE — Progress Notes (Signed)
CLIENT STATES RIGHT LEG IS CRAMPING AND CHRIS BERGE,NP NOTIFIED AND PER CHRIS CLIENT HAD TOLD HIM THAT WHEN HE BROUGHT CLIENT WORK NOTE

## 2013-05-15 NOTE — Interval H&P Note (Signed)
History and Physical Interval Note:  05/15/2013 9:50 AM  [name]  has presented today for cardiac cath with the diagnosis of chest pain/CAD.  The various methods of treatment have been discussed with the patient and family. After consideration of risks, benefits and other options for treatment, the patient has consented to  Procedure(s): LEFT HEART CATHETERIZATION WITH CORONARY/GRAFT ANGIOGRAM (N/A) as a surgical intervention .  The patient's history has been reviewed, patient examined, no change in status, stable for surgery.  I have reviewed the patient's chart and labs.  Questions were answered to the patient's satisfaction.    Cath Lab Visit (complete for each Cath Lab visit)  Clinical Evaluation Leading to the Procedure:   ACS: no  Non-ACS:    Anginal Classification: CCS III  Anti-ischemic medical therapy: No Therapy  Non-Invasive Test Results: No non-invasive testing performed  Prior CABG: Previous CABG        [name]

## 2013-05-15 NOTE — Progress Notes (Signed)
1835 - Patient left AMA but refused to sign the AMA form. Advised patient of risks associated with leaving AMA.   Left ambulatory without signs of distress.  Notified physician.

## 2013-05-15 NOTE — Progress Notes (Signed)
CHRIS BERGE,NP NOTIFIED OF B/P'S 90'S SYSTOLIC AND NO NEW ORDERS NOTED

## 2013-05-15 NOTE — Progress Notes (Signed)
CLIENT STATES RIGHT GROIN IS NOW 10/10 PAIN AND NOT A CRAMP; CHRIS BERGE,NP NOTIFIED AND WILL BE IN TO SEE CLIENT

## 2013-05-15 NOTE — Progress Notes (Signed)
CHRIS BERGE,NP IN TO SEE CLIENT AND ORDERS NOTED AND MED GIVEN

## 2013-05-15 NOTE — H&P (View-Only) (Signed)
   [name] Date of Birth: 01/09/1961 Medical Record #[id number]  History of Present Illness: Mr. Hamon is seen back today for a work in visit. Seen for Dr. McAlhany. He has previously seen Dr. Tennant in the remote past. He has extensive CAD, ongoing tobacco abuse, past cocaine abuse, HTN and HLD. Had an anterior MI in April of 2002 treated with stenting of the LAD, stenting of the LCX in April of 2003 for recurrent symptoms, an inferolateral STEMI in March of 2010 and had balloon PCI to the LCX - followed by emergent CABG with LIMA to LAD, SVG to LCX, SVG to PDA per Dr. Mekisha Bittel. He has had a NSTEMI back in November of 2010 and had BMS of the SVG to the OM. Has had past stroke along with longstanding noncompliance.   He was last seen here in February - was without symptoms but had stopped all of his medicines. ASA, HCTZ and statin restarted. He was on Cialis for ED.   Saw a new PCP earlier this week - reported return of chest pain - EKG was ok. Imdur was started but did not sound like he took it. Had no NTG. Called yesterday with fatigue, and worsening angina. Advised to be seen - thus added to my schedule today.   Comes in today. Here alone. Has not been doing well for the past 2 to 3 months. More angina, more shortness of breath. More fatigue. Lots of stress with work. Having symptoms with and without exertion. Can't even get up a flight of steps. Still smoking - using vapor cigarette. Not taking any of his prescribed medicines - only aspirin - says his medicines make him feel bad/dizzy. Just got his Imdur filled today - has not started yet. Taking NTG now every day - about once a day - with short lived results.    Current Outpatient Prescriptions  Medication Sig Dispense Refill  . aspirin 81 MG tablet Take 1 tablet (81 mg total) by mouth daily.  30 tablet  0  . hydrochlorothiazide (HYDRODIURIL) 25 MG tablet Take 1 tablet (25 mg total) by mouth daily.  30 tablet  6  . isosorbide  mononitrate (IMDUR) 30 MG 24 hr tablet Take 1 tablet (30 mg total) by mouth daily.  30 tablet  6  . metoprolol tartrate (LOPRESSOR) 25 MG tablet Take 0.5 tablets (12.5 mg total) by mouth 2 (two) times daily.  30 tablet  3  . nitroGLYCERIN (NITROSTAT) 0.4 MG SL tablet Place 1 tablet (0.4 mg total) under the tongue every 5 (five) minutes as needed for chest pain.  20 tablet  1  . tamsulosin (FLOMAX) 0.4 MG CAPS capsule Take 1 capsule (0.4 mg total) by mouth daily after supper.  30 capsule  1   No current facility-administered medications for this visit.    No Known Allergies  Past Medical History  Diagnosis Date  . Hypertension   . History of tobacco abuse     still smokes  . Myocardial infarction 12/07/2000    STEMI March 2010, angioplasty of Circumflex and emergent CABG November 07, 2008, bare metal stent to SVG to Circumflex with bare metal stent  November 2010.   . Chest pain   . Coronary artery disease   . Cerebrovascular disease   . Abnormality of gait   . Cocaine use     denies current use     Past Surgical History  Procedure Laterality Date  . Cardiac catheterization  11/07/2008      EF of 50-55% -- normal LV systolic function, acute inferolateral ST elevation MI,  PTCA of the circumflex artery -- Stanley N. Tennant, [name]  . Coronary artery bypass graft      History  Smoking status  . Current Every Day Smoker -- 1.00 packs/day for 30 years  . Types: Cigarettes  Smokeless tobacco  . Never Used    Comment: pt also uses   electric cigarettes    History  Alcohol Use  . Yes    Comment: beer on occasions    Family History  Problem Relation Age of Onset  . Heart disease Mother   . Heart failure Mother   . Diabetes Mother   . Prostate cancer Neg Hx   . Colon cancer Neg Hx     Review of Systems: The review of systems is per the HPI.  All other systems were reviewed and are negative.  Physical Exam: BP 138/80  Pulse 90  Ht 5' 9.5" (1.765 m)  Wt 216 lb (97.977 kg)   BMI 31.45 kg/m2 Patient is alert and in no acute distress. Looks older than his stated age. Skin is warm and dry. Color is normal.  HEENT is unremarkable. Normocephalic/atraumatic. PERRL. Sclera are nonicteric. Neck is supple. No masses. No JVD. Lungs are coarse. Cardiac exam shows a regular rate and rhythm. Abdomen is soft. Extremities are without edema. Gait and ROM are intact. No gross neurologic deficits noted.  LABORATORY DATA:  EKG today shows sinus rhythm with nonspecific ST/T wave changes  Lab Results  Component Value Date   WBC 7.6 05/05/2013   HGB 10.8* 05/05/2013   HCT 33.8* 05/05/2013   PLT 276.0 05/05/2013   GLUCOSE 87 05/05/2013   CHOL 179 05/05/2013   TRIG 132.0 05/05/2013   HDL 43.40 05/05/2013   LDLDIRECT 105.0 11/04/2009   LDLCALC 109* 05/05/2013   ALT 16 05/05/2013   AST 22 05/05/2013   NA 135 05/05/2013   K 4.2 05/05/2013   CL 103 05/05/2013   CREATININE 0.9 05/05/2013   BUN 13 05/05/2013   CO2 27 05/05/2013   TSH 1.88 05/05/2013   INR 1.3 11/07/2008    Assessment / Plan: 1. Chest pain - known CAD - ongoing substance abuse - symptoms sound cardiac - having spells with and without exertion - refused to be admitted today - says he will show up on Monday - will talked with Dr. McAlhany who is in agreement with my plan - patient has refused admission for today. Cardiac cath arranged for Monday with Dr. McAlhany. Rechecking labs today.   2.HTN - off all of his medicines.   3. HLD - off all of his medicines.   Patient is agreeable to this plan and will call if any problems develop in the interim.   Kaydyn Chism C. Knute Mazzuca, RN, ANP-C [facility] [address] [place], [place]  [address]  

## 2013-05-15 NOTE — CV Procedure (Signed)
     Cardiac Catheterization Operative Report  [name] [id number] 10/6/201410:37 AM [name], MD  Procedure Performed:  1. Left Heart Catheterization 2. Selective Coronary Angiography 3. SVG angiography 4. LIMA graft angiography 5. Left ventricular angiogram  Operator: Verne Carrow, MD  Indication:  52 yo male with history of CAD s/p CABG, prior PCI with bare metal stent in the SVG to the OM in 2010, ongoing tobacco abuse, medication non-compliance with recent chest pain concerning for unstable angina.                                   Procedure Details: The risks, benefits, complications, treatment options, and expected outcomes were discussed with the patient. The patient and/or family concurred with the proposed plan, giving informed consent. The patient was brought to the cath lab after IV hydration was begun and oral premedication was given. The patient was further sedated with Versed and Fentanyl. The right groin was prepped and draped in the usual manner. Using the modified Seldinger access technique, a 5 French sheath was placed in the right femoral artery. Standard diagnostic catheters were used to perform selective coronary angiography. The JR4 was used to engage the SVG to the RCA. An AL2 catheter was used to engage the SVG to the OM. An IMA catheter was used to engage the IMA graft to the LAD. A pigtail catheter was used to perform a left ventricular angiogram.   There were no immediate complications. The patient was taken to the recovery area in stable condition.   Hemodynamic Findings: Central aortic pressure: 109/67 Left ventricular pressure: 113/10/25  Angiographic Findings:  Left main:  20% distal stenosis.   Left Anterior Descending Artery: Large caliber vessel that courses to the apex. The proximal vessel is heavily calcified. The mid vessel has diffuse 60% stenosis. The mid vessel has a focal 90% stenosis. The distal vessel fills from antegrade  flow and is seen to fill competitively from the patent IMA graft. There is a small caliber diagonal branch that is seen to fill from antegrade flow. This diagonal has moderate diffuse plaque.   Circumflex Artery: Moderate caliber vessel with diffuse moderate proximal stenosis, 100% mid occlusion. The obtuse marginal branch and the distal AV groove Circumflex fills from the patent vein graft.   Right Coronary Artery: 100% proximal occlusion. The distal vessel fills from the patent vein graft.   Graft Anatomy:  SVG to RCA is patent with no obstructive disease SVG to OM is patent. There is a stent in the proximal body of the graft, no restenosis.  LIMA to mid LAD is patent with no obstructive disease noted in the body of the graft.   Left Ventricular Angiogram: LVEF=55-60%.   Impression: 1. Severe triple vessel CAD s/p 3V CABG with 3/3 patent bypass grafts.  2. Normal LV function 3. Chest pain, possible GERD  Recommendations: Continue cardiac medications. D/C home today. Start Pepcid daily for possible GERD.        Complications:  None. The patient tolerated the procedure well.

## 2013-05-16 ENCOUNTER — Ambulatory Visit (HOSPITAL_COMMUNITY): Payer: BC Managed Care – PPO | Attending: Cardiovascular Disease

## 2013-05-16 ENCOUNTER — Telehealth: Payer: Self-pay | Admitting: Cardiovascular Disease

## 2013-05-16 DIAGNOSIS — M79609 Pain in unspecified limb: Secondary | ICD-10-CM | POA: Insufficient documentation

## 2013-05-16 DIAGNOSIS — R1031 Right lower quadrant pain: Secondary | ICD-10-CM

## 2013-05-16 DIAGNOSIS — I10 Essential (primary) hypertension: Secondary | ICD-10-CM | POA: Insufficient documentation

## 2013-05-16 DIAGNOSIS — E785 Hyperlipidemia, unspecified: Secondary | ICD-10-CM | POA: Insufficient documentation

## 2013-05-16 DIAGNOSIS — I2510 Atherosclerotic heart disease of native coronary artery without angina pectoris: Secondary | ICD-10-CM | POA: Insufficient documentation

## 2013-05-16 DIAGNOSIS — F17200 Nicotine dependence, unspecified, uncomplicated: Secondary | ICD-10-CM | POA: Insufficient documentation

## 2013-05-16 NOTE — Telephone Encounter (Signed)
Reviewed with Debby Freiberg, NP and pt should have right femoral artery duplex. I spoke with Missy in the PV lab and this can be done today at 2:30. I spoke with pt and he will be here for this appt.

## 2013-05-16 NOTE — Telephone Encounter (Signed)
New problem:  Pt states he had a Cath yesterday by Dr. Clifton James. Pt states he has questions for the doctor and the pt states he has right leg pain. He would like to be advised.

## 2013-05-16 NOTE — Telephone Encounter (Signed)
Chart reviewed and pt had pain post cath yesterday. Left AMA.  I spoke with pt who reports unbearable pain in Right groin area. Pain goes down right thigh to just below right kneecap. Foot warm. Cath sight slightly puffy per pt report but no bruising noted. He report he received pain medication and IV medication in hospital for pain but this did not help. He then left hospital last evening with ongoing pain.  He reports he took 3 tylenol when he arrived home last evening. He had some relief with tylenol. Continued to take tylenol through the night with little relief. Pain is continuing this AM. Will send to Dr. Clifton James for advice. I have instructed pt if pain worsens he should go to ED to be evaluated.

## 2013-05-16 NOTE — Telephone Encounter (Signed)
Spoke with Almira Coaster in PV lab--doppler studies negative. I spoke with pt while in office and told him to try to rest and use Tylenol as needed for pain.  I told pt he should follow label instructions for Tylenol use.

## 2013-05-17 NOTE — Telephone Encounter (Signed)
I agree with plan. His leg pain may be from nerve irritation during cath and should improve. chris

## 2013-05-18 ENCOUNTER — Telehealth: Payer: Self-pay | Admitting: Cardiovascular Disease

## 2013-05-18 ENCOUNTER — Telehealth: Payer: Self-pay | Admitting: Pulmonary Disease

## 2013-05-18 NOTE — Telephone Encounter (Signed)
I spoke with the patient and advised that he needs to contact cardiology that did his cath and let them know. Never been seen in our office. Carron Curie, CMA

## 2013-05-18 NOTE — Telephone Encounter (Signed)
See previous note from yesterday. Pts arterial duplex was negative. Pt states his right thigh feels like it is in constant "charlie horse" feeling. Pt does not feel lump in thigh muscle but rubbing it feels good. Pt states the tylenol is not working and was not able to sleep. .  Pt is unable to stretch his thigh and hurts to walk.  Pt denies color change and coldness in right leg. Pt is requesting pain med or something for spasm. I will discuss with DOD Dr Ladona Ridgel. Pt to take motrin with food. Massage thigh muscle, walk and stretch as tolerated. Pt to continue to monitor and call with any changes/ pt agreed to plan.

## 2013-05-18 NOTE — Telephone Encounter (Signed)
New problem  Pt states that after his cath he was advised to rest // his legs began to cramp serverly// pt states that he is still in pain. .. The tylenol is not working and the muscles spasms are unbearable.. Please call

## 2013-05-19 NOTE — Telephone Encounter (Signed)
Agree. Thanks. cdm 

## 2013-06-13 ENCOUNTER — Encounter: Payer: Self-pay | Admitting: Pulmonary Disease

## 2013-06-13 ENCOUNTER — Ambulatory Visit (INDEPENDENT_AMBULATORY_CARE_PROVIDER_SITE_OTHER): Payer: BC Managed Care – PPO | Admitting: Pulmonary Disease

## 2013-06-13 VITALS — BP 128/82 | HR 81 | Temp 98.1°F | Ht 69.0 in | Wt 212.0 lb

## 2013-06-13 DIAGNOSIS — G4733 Obstructive sleep apnea (adult) (pediatric): Secondary | ICD-10-CM

## 2013-06-13 NOTE — Patient Instructions (Addendum)
Sleep study will be scheduled You may need CPAP machine

## 2013-06-13 NOTE — Assessment & Plan Note (Signed)
Sleep study will be scheduled You may need CPAP machine  Given excessive daytime somnolence, narrow pharyngeal exam, witnessed apneas & loud snoring, obstructive sleep apnea is very likely & an overnight polysomnogram will be scheduled as a split study. The pathophysiology of obstructive sleep apnea , it's cardiovascular consequences & modes of treatment including CPAP were discused with the patient in detail & they evidenced understanding.

## 2013-06-13 NOTE — Progress Notes (Signed)
Subjective:    Patient ID: [name], male    DOB: 1960-10-03, 52 y.o.   MRN: [id number]  HPI  Chief Complaint  Patient presents with  . Sleep Consult    Pt states he stops breathing during the night and it wakes him up. Pt also c/o having daytime fatigue.  He also c/o loud snoring.    52 year old heavy smoker referred for evaluation of obstructive sleep apnea. Epworth sleepiness score is 21 / 24. He states that his girlfriend worries about him and reports loud snoring. He has coronary disease status post CABG .Cardiac cath in 10/14 showed patent three-vessel grafts and normal ejection fraction. Bedtime is around 11 PM, sleep latency is minimal, he does not have his side preference and sleeps with one pillow, denies orthopnea. He is nocturnal awakenings every hour including nocturia without a post void sleep latency and is out of bed at 6:30 AM feeling tired with dryness of mouth and sore throat. He is gained 20 pounds in the last 2 years  There is no history suggestive of cataplexy, sleep paralysis or parasomnias He is no problems driving. He works as an Geographical information systems officer  Past Medical History  Diagnosis Date  . Hypertension   . History of tobacco abuse     still smokes  . Myocardial infarction 12/07/2000    STEMI March 2010, angioplasty of Circumflex and emergent CABG November 07, 2008, bare metal stent to SVG to Circumflex with bare metal stent  November 2010.   [name] Chest pain   . Coronary artery disease   . Cerebrovascular disease   . Abnormality of gait   . Cocaine use     denies current use     Past Surgical History  Procedure Laterality Date  . Cardiac catheterization  11/07/2008    EF of 50-55% -- normal LV systolic function, acute inferolateral ST elevation MI,  PTCA of the circumflex artery -- Colleen Can. Deborah Chalk, [name]  . Coronary artery bypass graft     No Known Allergies  History   Social History  . Marital Status: Single    Spouse Name: N/A     Number of Children: 2  . Years of Education: N/A   Occupational History  . 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 Smoking status: Current Every Day Smoker -- 1.00 packs/day for 30 years    Types: Cigarettes  . Smokeless tobacco: Never Used     Comment: pt also uses   electric cigarettes  . Alcohol Use: Yes     Comment: 2-3 beers a week  . Drug Use: No     Comment: former users  . Sexual Activity: Not Currently   Other Topics Concern  . Not on file   Social History Narrative   Lives w/ girlfriend     Family History  Problem Relation Age of Onset  . Heart disease Mother   . Heart failure Mother   . Diabetes Mother   . Prostate cancer Neg Hx   . Colon cancer Neg Hx       Review of Systems  Constitutional: Negative for fever and unexpected weight change.  HENT: Negative for congestion, dental problem, ear pain, nosebleeds, postnasal drip, rhinorrhea, sinus pressure, sneezing, sore throat and trouble swallowing.   Eyes: Negative for redness and itching.  Respiratory: Positive for shortness of breath. Negative for cough, chest tightness and wheezing.   Cardiovascular: Negative for palpitations and leg swelling.  Gastrointestinal:  Negative for nausea and vomiting.  Genitourinary: Negative for dysuria.  Musculoskeletal: Negative for joint swelling.  Skin: Negative for rash.  Neurological: Negative for headaches.  Hematological: Does not bruise/bleed easily.  Psychiatric/Behavioral: Negative for dysphoric mood. The patient is not nervous/anxious.        Objective:   Physical Exam  Gen. Pleasant, obese, in no distress, normal affect ENT - no lesions, no post nasal drip, class 2-3 airway Neck: No JVD, no thyromegaly, no carotid bruits Lungs: no use of accessory muscles, no dullness to percussion, decreased without rales or rhonchi  Cardiovascular: Rhythm regular, heart sounds  normal, no murmurs or gallops, no peripheral  edema Abdomen: soft and non-tender, no hepatosplenomegaly, BS normal. Musculoskeletal: No deformities, no cyanosis or clubbing Neuro:  alert, non focal, no tremors       Assessment & Plan:

## 2013-07-03 ENCOUNTER — Ambulatory Visit: Payer: Self-pay | Admitting: Family Medicine

## 2013-07-16 ENCOUNTER — Encounter (HOSPITAL_BASED_OUTPATIENT_CLINIC_OR_DEPARTMENT_OTHER): Payer: BC Managed Care – PPO

## 2013-08-18 ENCOUNTER — Ambulatory Visit: Payer: BC Managed Care – PPO | Admitting: Cardiovascular Disease

## 2013-09-29 ENCOUNTER — Encounter: Payer: Self-pay | Admitting: Cardiovascular Disease

## 2014-07-19 ENCOUNTER — Encounter (HOSPITAL_COMMUNITY): Payer: Self-pay | Admitting: Cardiology

## 2015-02-08 DIAGNOSIS — I639 Cerebral infarction, unspecified: Secondary | ICD-10-CM

## 2015-02-08 HISTORY — DX: Cerebral infarction, unspecified: I63.9

## 2015-02-19 ENCOUNTER — Telehealth: Payer: Self-pay | Admitting: Cardiovascular Disease

## 2015-02-19 NOTE — Telephone Encounter (Signed)
Spoke with pt who reports he has not taken his medications in a year and is feeling bad. Thinks it may be his blood pressure. Does not have any readings to report. Asking if we can refill his medications.  Pt reports blurred vision in right eye and right arm numbness.  He reports history of a stroke.  States he talked with neurology yesterday and they recommended he go to ED.  I instructed pt he needed to go to ED to have his symptoms evaluated.  He is reluctant to do so but did eventually agree to go to ED.  I told him appt could be made for him to see Dr. Angelena Form based on recommendations from hospital visit.

## 2015-02-19 NOTE — Telephone Encounter (Signed)
Agree. thanks

## 2015-02-19 NOTE — Telephone Encounter (Signed)
NewMessage  Pt calling to speak w/ RN about medications- would not specify. Please call back and discuss.

## 2015-02-20 ENCOUNTER — Inpatient Hospital Stay (HOSPITAL_COMMUNITY): Payer: Medicaid Other

## 2015-02-20 ENCOUNTER — Emergency Department (HOSPITAL_COMMUNITY): Payer: Medicaid Other

## 2015-02-20 ENCOUNTER — Inpatient Hospital Stay (HOSPITAL_COMMUNITY)
Admission: EM | Admit: 2015-02-20 | Discharge: 2015-02-22 | DRG: 065 | Disposition: A | Discharge status: Home / Self Care | Payer: Medicaid Other | Attending: Internal Medicine | Admitting: Internal Medicine

## 2015-02-20 ENCOUNTER — Encounter (HOSPITAL_COMMUNITY): Payer: Self-pay | Admitting: Internal Medicine

## 2015-02-20 DIAGNOSIS — Z951 Presence of aortocoronary bypass graft: Secondary | ICD-10-CM | POA: Diagnosis not present

## 2015-02-20 DIAGNOSIS — I6300 Cerebral infarction due to thrombosis of unspecified precerebral artery: Secondary | ICD-10-CM

## 2015-02-20 DIAGNOSIS — E785 Hyperlipidemia, unspecified: Secondary | ICD-10-CM | POA: Diagnosis present

## 2015-02-20 DIAGNOSIS — Z91199 Patient's noncompliance with other medical treatment and regimen due to unspecified reason: Secondary | ICD-10-CM

## 2015-02-20 DIAGNOSIS — Z9119 Patient's noncompliance with other medical treatment and regimen: Secondary | ICD-10-CM | POA: Diagnosis present

## 2015-02-20 DIAGNOSIS — F17210 Nicotine dependence, cigarettes, uncomplicated: Secondary | ICD-10-CM | POA: Diagnosis present

## 2015-02-20 DIAGNOSIS — H53461 Homonymous bilateral field defects, right side: Secondary | ICD-10-CM | POA: Diagnosis present

## 2015-02-20 DIAGNOSIS — I639 Cerebral infarction, unspecified: Secondary | ICD-10-CM | POA: Diagnosis present

## 2015-02-20 DIAGNOSIS — N400 Enlarged prostate without lower urinary tract symptoms: Secondary | ICD-10-CM | POA: Diagnosis present

## 2015-02-20 DIAGNOSIS — H5340 Unspecified visual field defects: Secondary | ICD-10-CM | POA: Diagnosis present

## 2015-02-20 DIAGNOSIS — I252 Old myocardial infarction: Secondary | ICD-10-CM | POA: Diagnosis not present

## 2015-02-20 DIAGNOSIS — I69351 Hemiplegia and hemiparesis following cerebral infarction affecting right dominant side: Secondary | ICD-10-CM

## 2015-02-20 DIAGNOSIS — I2510 Atherosclerotic heart disease of native coronary artery without angina pectoris: Secondary | ICD-10-CM | POA: Diagnosis present

## 2015-02-20 DIAGNOSIS — F1010 Alcohol abuse, uncomplicated: Secondary | ICD-10-CM | POA: Diagnosis present

## 2015-02-20 DIAGNOSIS — H53462 Homonymous bilateral field defects, left side: Secondary | ICD-10-CM

## 2015-02-20 DIAGNOSIS — F1410 Cocaine abuse, uncomplicated: Secondary | ICD-10-CM | POA: Diagnosis present

## 2015-02-20 DIAGNOSIS — Z7982 Long term (current) use of aspirin: Secondary | ICD-10-CM | POA: Diagnosis not present

## 2015-02-20 DIAGNOSIS — I6789 Other cerebrovascular disease: Secondary | ICD-10-CM | POA: Diagnosis not present

## 2015-02-20 DIAGNOSIS — H547 Unspecified visual loss: Secondary | ICD-10-CM | POA: Diagnosis present

## 2015-02-20 DIAGNOSIS — H538 Other visual disturbances: Secondary | ICD-10-CM | POA: Diagnosis not present

## 2015-02-20 DIAGNOSIS — I10 Essential (primary) hypertension: Secondary | ICD-10-CM | POA: Diagnosis present

## 2015-02-20 DIAGNOSIS — F17200 Nicotine dependence, unspecified, uncomplicated: Secondary | ICD-10-CM | POA: Diagnosis present

## 2015-02-20 DIAGNOSIS — F1411 Cocaine abuse, in remission: Secondary | ICD-10-CM | POA: Diagnosis present

## 2015-02-20 LAB — CBC WITH DIFFERENTIAL/PLATELET
BAND NEUTROPHILS: 2 % (ref 0–10)
BASOS PCT: 2 % — AB (ref 0–1)
Basophils Absolute: 0.1 10*3/uL (ref 0.0–0.1)
Blasts: 0 %
Eosinophils Absolute: 0 10*3/uL (ref 0.0–0.7)
Eosinophils Relative: 0 % (ref 0–5)
HCT: 36.2 % — ABNORMAL LOW (ref 39.0–52.0)
Hemoglobin: 10.8 g/dL — ABNORMAL LOW (ref 13.0–17.0)
Lymphocytes Relative: 16 % (ref 12–46)
Lymphs Abs: 1.2 10*3/uL (ref 0.7–4.0)
MCH: 22.2 pg — ABNORMAL LOW (ref 26.0–34.0)
MCHC: 29.8 g/dL — ABNORMAL LOW (ref 30.0–36.0)
MCV: 74.3 fL — ABNORMAL LOW (ref 78.0–100.0)
Metamyelocytes Relative: 0 %
Monocytes Absolute: 0.9 10*3/uL (ref 0.1–1.0)
Monocytes Relative: 12 % (ref 3–12)
Myelocytes: 0 %
NEUTROS PCT: 68 % (ref 43–77)
NRBC: 0 /100{WBCs}
Neutro Abs: 5.1 10*3/uL (ref 1.7–7.7)
Other: 0 %
Platelets: DECREASED 10*3/uL (ref 150–400)
Promyelocytes Absolute: 0 %
RBC: 4.87 MIL/uL (ref 4.22–5.81)
RDW: 20.8 % — ABNORMAL HIGH (ref 11.5–15.5)
WBC: 7.3 10*3/uL (ref 4.0–10.5)

## 2015-02-20 LAB — BASIC METABOLIC PANEL
ANION GAP: 8 (ref 5–15)
BUN: 10 mg/dL (ref 6–20)
CALCIUM: 9 mg/dL (ref 8.9–10.3)
CO2: 23 mmol/L (ref 22–32)
Chloride: 105 mmol/L (ref 101–111)
Creatinine, Ser: 0.88 mg/dL (ref 0.61–1.24)
GFR calc non Af Amer: >60 mL/min (ref >60)
Glucose, Bld: 91 mg/dL (ref 65–99)
Potassium: 4.4 mmol/L (ref 3.5–5.1)
Sodium: 136 mmol/L (ref 135–145)

## 2015-02-20 LAB — RAPID URINE DRUG SCREEN, HOSP PERFORMED
AMPHETAMINES: NOT DETECTED
Barbiturates: NOT DETECTED
Benzodiazepines: NOT DETECTED
Cocaine: NOT DETECTED
Opiates: NOT DETECTED
Tetrahydrocannabinol: POSITIVE — AB

## 2015-02-20 LAB — URINE MICROSCOPIC-ADD ON

## 2015-02-20 LAB — URINALYSIS, ROUTINE W REFLEX MICROSCOPIC
Bilirubin Urine: NEGATIVE
Glucose, UA: NEGATIVE mg/dL
Hgb urine dipstick: NEGATIVE
Ketones, ur: NEGATIVE mg/dL
NITRITE: NEGATIVE
PH: 5.5 (ref 5.0–8.0)
PROTEIN: NEGATIVE mg/dL
Specific Gravity, Urine: 1.02 (ref 1.005–1.030)
Urobilinogen, UA: 1 mg/dL (ref 0.0–1.0)

## 2015-02-20 LAB — TROPONIN I: Troponin I: <0.03 ng/mL (ref <0.031)

## 2015-02-20 LAB — I-STAT CHEM 8, ED
BUN: 12 mg/dL (ref 6–20)
Calcium, Ion: 1.23 mmol/L (ref 1.12–1.23)
Chloride: 101 mmol/L (ref 101–111)
Creatinine, Ser: 0.8 mg/dL (ref 0.61–1.24)
GLUCOSE: 93 mg/dL (ref 65–99)
HCT: 39 % (ref 39.0–52.0)
HEMOGLOBIN: 13.3 g/dL (ref 13.0–17.0)
Potassium: 4.2 mmol/L (ref 3.5–5.1)
Sodium: 138 mmol/L (ref 135–145)
TCO2: 24 mmol/L (ref 0–100)

## 2015-02-20 LAB — CBG MONITORING, ED: GLUCOSE-CAPILLARY: 103 mg/dL — AB (ref 65–99)

## 2015-02-20 MED ORDER — LORAZEPAM 2 MG/ML IJ SOLN: 0.0000 mg | Freq: Two times a day (BID) | INTRAMUSCULAR | Status: DC

## 2015-02-20 MED ORDER — FOLIC ACID 1 MG PO TABS
1.0000 mg | ORAL_TABLET | Freq: Every day | ORAL | Status: DC
  Administered 2015-02-20 – 2015-02-22 (×3): 1 mg via ORAL
  Filled 2015-02-20 (×3): qty 1

## 2015-02-20 MED ORDER — LORAZEPAM 1 MG PO TABS: 1.0000 mg | ORAL_TABLET | Freq: Four times a day (QID) | ORAL | Status: DC | PRN

## 2015-02-20 MED ORDER — LORAZEPAM 2 MG/ML IJ SOLN
1.0000 mg | Freq: Once | INTRAMUSCULAR | Status: AC
  Administered 2015-02-20: 1 mg via INTRAVENOUS
  Filled 2015-02-20: qty 1

## 2015-02-20 MED ORDER — ASPIRIN 325 MG PO TABS
325.0000 mg | ORAL_TABLET | Freq: Every day | ORAL | Status: DC
  Administered 2015-02-20 – 2015-02-22 (×3): 325 mg via ORAL
  Filled 2015-02-20 (×3): qty 1

## 2015-02-20 MED ORDER — TAMSULOSIN HCL 0.4 MG PO CAPS
0.4000 mg | ORAL_CAPSULE | Freq: Every day | ORAL | Status: DC
  Administered 2015-02-20 – 2015-02-22 (×3): 0.4 mg via ORAL
  Filled 2015-02-20 (×3): qty 1

## 2015-02-20 MED ORDER — ACETAMINOPHEN 325 MG PO TABS: 650.0000 mg | ORAL_TABLET | ORAL | Status: DC | PRN

## 2015-02-20 MED ORDER — PANTOPRAZOLE SODIUM 40 MG PO TBEC
40.0000 mg | DELAYED_RELEASE_TABLET | Freq: Every day | ORAL | Status: DC
  Administered 2015-02-21 – 2015-02-22 (×2): 40 mg via ORAL
  Filled 2015-02-20 (×2): qty 1

## 2015-02-20 MED ORDER — STROKE: EARLY STAGES OF RECOVERY BOOK
Freq: Once | Status: AC
  Administered 2015-02-20: 19:00:00

## 2015-02-20 MED ORDER — SIMVASTATIN 20 MG PO TABS
20.0000 mg | ORAL_TABLET | Freq: Every day | ORAL | Status: DC
  Administered 2015-02-21: 20 mg via ORAL
  Filled 2015-02-20: qty 1

## 2015-02-20 MED ORDER — ACETAMINOPHEN 650 MG RE SUPP: 650.0000 mg | RECTAL | Status: DC | PRN

## 2015-02-20 MED ORDER — LORAZEPAM 2 MG/ML IJ SOLN: 1.0000 mg | Freq: Four times a day (QID) | INTRAMUSCULAR | Status: DC | PRN

## 2015-02-20 MED ORDER — ENOXAPARIN SODIUM 30 MG/0.3ML ~~LOC~~ SOLN
30.0000 mg | Freq: Two times a day (BID) | SUBCUTANEOUS | Status: DC
  Administered 2015-02-20: 30 mg via SUBCUTANEOUS
  Filled 2015-02-20: qty 0.3

## 2015-02-20 MED ORDER — NICOTINE 14 MG/24HR TD PT24
14.0000 mg | MEDICATED_PATCH | Freq: Every day | TRANSDERMAL | Status: DC
  Administered 2015-02-20 – 2015-02-22 (×3): 14 mg via TRANSDERMAL
  Filled 2015-02-20 (×3): qty 1

## 2015-02-20 MED ORDER — VITAMIN B-1 100 MG PO TABS
100.0000 mg | ORAL_TABLET | Freq: Every day | ORAL | Status: DC
  Administered 2015-02-20 – 2015-02-22 (×3): 100 mg via ORAL
  Filled 2015-02-20 (×3): qty 1

## 2015-02-20 MED ORDER — ASPIRIN 300 MG RE SUPP: 300.0000 mg | Freq: Every day | RECTAL | Status: DC

## 2015-02-20 MED ORDER — LORAZEPAM 2 MG/ML IJ SOLN: 0.0000 mg | Freq: Four times a day (QID) | INTRAMUSCULAR | Status: DC

## 2015-02-20 MED ORDER — ADULT MULTIVITAMIN W/MINERALS CH
1.0000 | ORAL_TABLET | Freq: Every day | ORAL | Status: DC
  Administered 2015-02-20 – 2015-02-22 (×3): 1 via ORAL
  Filled 2015-02-20 (×3): qty 1

## 2015-02-20 MED ORDER — THIAMINE HCL 100 MG/ML IJ SOLN: 100.0000 mg | Freq: Every day | INTRAMUSCULAR | Status: DC

## 2015-02-20 NOTE — Consult Note (Signed)
Stroke Consult    Chief Complaint:  HPI: [name] is an 54 y.o. male hx of prior CVA,m MI, polysubstance abuse brought in for evaluation of bilateral blurred vision for the past 1 week. Describes symptoms as "oil over my eyes" and a loss of his inferior right quadrant. He also reports intermittent residual weakness on his right side since prior stroke in 2008 though reports it is worse in the past 1-2 weeks. Denies any headache, no sensory or speech deficits. Takes a daily ASA 81mg  but only started 4 days ago. Reports poor compliance with his BP medications. Reports drinking 3-4 16oz beers a day. Denies recent drug use.   MRI brain imaging completed and reviewed, shows 2cm infarct in left lateral thalamus to left optic radiation and small lacunar infarct of left corona radiata.   Date last known well: 02/13/2015 Time last known well: unclear tPA Given: no, outside tPA window  Past Medical History  Diagnosis Date  . Hypertension   . History of tobacco abuse     still smokes  . Myocardial infarction 12/07/2000    STEMI March 2010, angioplasty of Circumflex and emergent CABG November 07, 2008, bare metal stent to SVG to Circumflex with bare metal stent  November 2010.   [name] Chest pain   . Coronary artery disease   . Cerebrovascular disease   . Abnormality of gait   . Cocaine use     denies current use     Past Surgical History  Procedure Laterality Date  . Cardiac catheterization  11/07/2008    EF of 50-55% -- normal LV systolic function, acute inferolateral ST elevation MI,  PTCA of the circumflex artery -- [name]. [name], [name]  . Coronary artery bypass graft    . Left heart catheterization with coronary/graft angiogram N/A 05/15/2013    Procedure: LEFT HEART CATHETERIZATION WITH Beatrix Fetters;  Surgeon: Guilherme Schwenke M Martinique, MD;  Location: [facility] CATH LAB;  Service: Cardiovascular;  Laterality: N/A;    Family History  Problem Relation Age of Onset  . Heart disease Mother    . Heart failure Mother   . Diabetes Mother   . Prostate cancer Neg Hx   . Colon cancer Neg Hx    Social History:  reports that he has been smoking Cigarettes.  He has a 30 pack-year smoking history. He has never used smokeless tobacco. He reports that he drinks alcohol. He reports that he does not use illicit drugs.  Allergies: No Known Allergies   (Not in a hospital admission)  ROS: Out of a complete 14 system review, the patient complains of only the following symptoms, and all other reviewed systems are negative. +blurred vision  Physical Examination: Filed Vitals:   02/20/15 1430  BP: 140/92  Pulse: 78  Temp:   Resp: 20   Physical Exam  Constitutional: He appears well-developed and well-nourished.  Psych: Affect appropriate to situation Eyes: No scleral injection HENT: No OP obstrucion Head: Normocephalic.  Cardiovascular: Normal rate and regular rhythm.  Respiratory: Effort normal and breath sounds normal.  GI: Soft. Bowel sounds are normal. No distension. There is no tenderness.  Skin: WDI   Neurologic Examination: Mental Status: Alert, oriented, thought content appropriate.  Speech fluent without evidence of aphasia.  Able to follow 3 step commands without difficulty. Cranial Nerves: II: funduscopic exam wnl bilaterally, impaired right inferior VF, pupils equal, round, reactive to light  III,IV, VI: ptosis not present, extra-ocular motions intact bilaterally V,VII: smile symmetric, facial light touch  sensation normal bilaterally VIII: hearing normal bilaterally IX,X: gag reflex present XI: trapezius strength/neck flexion strength normal bilaterally XII: tongue strength normal  Motor: Right : Upper extremity    Left:     Upper extremity 5-/5 deltoid       5/5 deltoid 5-/5 biceps      5/5 biceps  5/5 triceps      5/5 triceps 5/5 hand grip      5/5 hand grip  Lower extremity     Lower extremity 4+/5 hip flexor      5/5 hip flexor 5-/5  quadricep      5/5 quadriceps  5-/5 hamstrings     5/5 hamstrings 5/5 plantar flexion       5/5 plantar flexion 5/5 plantar extension     5/5 plantar extension Tone and bulk:normal tone throughout; no atrophy noted Sensory: decreased LT RLE Deep Tendon Reflexes: 2+ and symmetric throughout Plantars: Right: downgoing   Left: downgoing Cerebellar: normal finger-to-nose and normal heel-to-shin test Gait: deferred  Laboratory Studies:   Basic Metabolic Panel:  Recent Labs Lab 02/20/15 1300 02/20/15 1448  NA 136 138  K 4.4 4.2  CL 105 101  CO2 23  --   GLUCOSE 91 93  BUN 10 12  CREATININE 0.88 0.80  CALCIUM 9.0  --     Liver Function Tests: No results for input(s): AST, ALT, ALKPHOS, BILITOT, PROT, ALBUMIN in the last 168 hours. No results for input(s): LIPASE, AMYLASE in the last 168 hours. No results for input(s): AMMONIA in the last 168 hours.  CBC:  Recent Labs Lab 02/20/15 1300 02/20/15 1448  WBC 7.3  --   NEUTROABS 5.1  --   HGB 10.8* 13.3  HCT 36.2* 39.0  MCV 74.3*  --   PLT PLATELET CLUMPS NOTED ON SMEAR, COUNT APPEARS DECREASED  --     Cardiac Enzymes:  Recent Labs Lab 02/20/15 1300  TROPONINI <0.03    BNP: Invalid input(s): POCBNP  CBG:  Recent Labs Lab 02/20/15 1450  GLUCAP 103*    Microbiology: No results found for this or any previous visit.  Coagulation Studies: No results for input(s): LABPROT, INR in the last 72 hours.  Urinalysis: No results for input(s): COLORURINE, LABSPEC, PHURINE, GLUCOSEU, HGBUR, BILIRUBINUR, KETONESUR, PROTEINUR, UROBILINOGEN, NITRITE, LEUKOCYTESUR in the last 168 hours.  Invalid input(s): APPERANCEUR  Lipid Panel:     Component Value Date/Time   CHOL 179 05/12/2013 1517   TRIG 214.0* 05/12/2013 1517   HDL 36.10* 05/12/2013 1517   CHOLHDL 5 05/12/2013 1517   VLDL 42.8* 05/12/2013 1517   LDLCALC 109* 05/05/2013 1214    HgbA1C: No results found for: HGBA1C  Urine Drug Screen:     Component  Value Date/Time   LABOPIA NONE DETECTED 11/07/2008 0945   COCAINSCRNUR POSITIVE* 11/07/2008 0945   LABBENZ POSITIVE* 11/07/2008 0945   AMPHETMU NONE DETECTED 11/07/2008 0945   THCU POSITIVE* 11/07/2008 0945   LABBARB  11/07/2008 0945    NONE DETECTED        DRUG SCREEN FOR MEDICAL PURPOSES ONLY.  IF CONFIRMATION IS NEEDED FOR ANY PURPOSE, NOTIFY LAB WITHIN 5 DAYS.        LOWEST DETECTABLE LIMITS FOR URINE DRUG SCREEN Drug Class       Cutoff (ng/mL) Amphetamine      1000 Barbiturate      200 Benzodiazepine   010 Tricyclics       932 Opiates          300 Cocaine  300 THC              50    Alcohol Level: No results for input(s): ETH in the last 168 hours.  Other results:  Imaging: Mr Brain Wo Contrast  02/20/2015   CLINICAL DATA:  54 year old male with blurred vision, right homonymous hemianopsia. Symptoms x1 week. Initial encounter. Right side weakness from prior stroke.  EXAM: MRI HEAD WITHOUT CONTRAST  TECHNIQUE: Multiplanar, multiecho pulse sequences of the brain and surrounding structures were obtained without intravenous contrast.  COMPARISON:  Brain MRI 12/05/2006 and earlier.  FINDINGS: Cerebral volume has mildly diminished since 2008. Major intracranial vascular flow voids appear stable, with a chronic degree of generalized intracranial artery dolichoectasia.  Confluent restricted diffusion in the dorsal and lateral left thalamus tracking into the left optic radiation (Series 5, image 14, encompassing a linear area of 2 cm. Associated T2 and FLAIR hyperintensity. Minimal to mild regional mass effect. No associated hemorrhage.  Small superimposed left corona radiata lacunar infarct with restricted diffusion (series 5, image 13). Mild T2 and FLAIR hyperintensity without hemorrhage or mass effect.  No contralateral right hemisphere or posterior fossa restricted diffusion. Small chronic inferior right cerebellar infarcts which relate to the 2008 abnormality. Small chronic  lacunar infarct of the right thalamus is new since 2008. Nearby right periatrial signal abnormality also most resembles a chronic lacunar infarct and is new from the prior exam. No chronic blood products identified in the brain. No midline shift or ventriculomegaly. Normal basilar cisterns. Negative pituitary, brainstem, cervicomedullary junction, and visualized cervical spine.  Normal bone marrow signal. Visible internal auditory structures appear normal. Mastoids are clear. Paranasal sinuses are clear. Optic chiasm and orbits soft tissues appear normal. Visualized scalp soft tissues are within normal limits. Normal bone marrow signal.  IMPRESSION: 1. Two cm infarct tracking from the left lateral thalamus inferiorly to the region of the left optic radiations. Associated edema without hemorrhage or mass effect. 2. Superimposed small acute white matter lacunar infarct of the posterior left corona radiata. Ordinarily this would be a different vascular territory than #1 (left PCA versus left MCA). Perhaps this reflects synchronous small vessel disease. 3. Other posterior circulation chronic small vessel ischemia with mild progression since the 2008 right cerebellar infarcts.   Electronically Signed   By: Genevie Ann [name]   On: 02/20/2015 16:06    Assessment: 54 y.o. male hx of HTN, prior CVA, substance abuse brought in with 1-2 week history of visual deficits and increased right sided weakness. MRI brain shows acute infarct in left lateral thalamus/optic radiation and acute infarct in left corona radiata. Suspect small vessel disease though cannot rule out embolic based on distribution of infarct. Will be admitted for stroke workup.   Plan: 1. HgbA1c, fasting lipid panel 2. MRA  of the brain without contrast 3. PT consult, OT consult, Speech consult 4. Echocardiogram 5. Carotid dopplers 6. Prophylactic therapy-increase ASA to 325mg  daily 7. Risk factor modification 8. Telemetry monitoring 9. Frequent neuro  checks 10. NPO until RN stroke swallow screen   Jim Like, DO [facility] [phone]  If 7pm- 7am, please page neurology on call as listed in [facility]. 02/20/2015, 4:13 PM

## 2015-02-20 NOTE — Progress Notes (Signed)
Patient admitted to 4N10. VSS, patient alert and oriented x4, NIH 2. Patient oriented to staff, unit, floor. Bed alarm on. Will continue to monitor.

## 2015-02-20 NOTE — ED Provider Notes (Signed)
CSN: [id number]     Arrival date & time 02/20/15  1244 History   First MD Initiated Contact with Patient 02/20/15 1315     No chief complaint on file.    (Consider location/radiation/quality/duration/timing/severity/associated sxs/prior Treatment) HPI   54 year old male with history of prior stroke, prior MI, polysubstance abuse, hypertension brought here via EMS for evaluation of blurred vision. Patient reports for the past 1 week he has had bilateral blurry vision in which he described as "oil over my eyes". Report visual field cut with decreased vision to the lateral visual field in right eye and medial field in the left eye.  Visual changes affecting his gait and ability to drive.  Report intermittent R arm weakness/numbness since his stroke in 08.  Weakness/numbness worsen for the past few weeks.  Report dropping objects due to it.  Symptom has been persistent not improved. Also endorsed having occasional lightheadedness with a syncope episode, happened 2 weeks ago at [employer]. Report prior stroke affecting his right side. No associated headache, nausea, vomiting, chest pain, shortness of breath, abdominal pain, back pain, or rash. Patient mentioned that he has had 3 prior MIs with 4 stents placed. He has ran out of his blood pressure medication for several years. Reports he did attempt to contact cardiologist and neurologist yesterday to described his "stroke symptoms" and was recommended to come to ER for further care. Patient mentioned he does drink alcohol her regular basis, usually consuming 3-4 16 ounce beer daily. He denies any recreational drug use including cocaine.     Past Medical History  Diagnosis Date  . Hypertension   . History of tobacco abuse     still smokes  . Myocardial infarction 12/07/2000    STEMI March 2010, angioplasty of Circumflex and emergent CABG November 07, 2008, bare metal stent to SVG to Circumflex with bare metal stent  November 2010.   [name] Chest pain   .  Coronary artery disease   . Cerebrovascular disease   . Abnormality of gait   . Cocaine use     denies current use    Past Surgical History  Procedure Laterality Date  . Cardiac catheterization  11/07/2008    EF of 50-55% -- normal LV systolic function, acute inferolateral ST elevation MI,  PTCA of the circumflex artery -- [name]. [name], [name]  . Coronary artery bypass graft    . Left heart catheterization with coronary/graft angiogram N/A 05/15/2013    Procedure: LEFT HEART CATHETERIZATION WITH Beatrix Fetters;  Surgeon: [name], MD;  Location: [facility] C[facility] LAB;  Service: Cardiovascular;  Laterality: N/A;   Family History  Problem Relation Age of Onset  . Heart disease Mother   . Heart failure Mother   . Diabetes Mother   . Prostate cancer Neg Hx   . Colon cancer Neg Hx    History  Substance Use Topics  . Smoking status: Current Every Day Smoker -- 1.00 packs/day for 30 years    Types: Cigarettes  . Smokeless tobacco: Never Used     Comment: pt also uses   electric cigarettes  . Alcohol Use: Yes     Comment: 2-3 beers a week    Review of Systems  All other systems reviewed and are negative.     Allergies  Review of patient's allergies indicates no known allergies.  Home Medications   Prior to Admission medications   Medication Sig Start Date End Date Taking? Authorizing Provider  aspirin 81 MG tablet Take 81  mg by mouth daily. 03/17/12   Burnell Blanks, MD  isosorbide mononitrate (IMDUR) 30 MG 24 hr tablet Take 30 mg by mouth daily. 05/05/13   Colon Branch, MD  metoprolol tartrate (LOPRESSOR) 25 MG tablet Take 25 mg by mouth 2 (two) times daily.    Historical Provider, MD  nitroGLYCERIN (NITROSTAT) 0.4 MG SL tablet Place 1 tablet (0.4 mg total) under the tongue every 5 (five) minutes as needed for chest pain. 05/05/13   Colon Branch, MD  tamsulosin (FLOMAX) 0.4 MG CAPS capsule Take 0.4 mg by mouth daily.    Historical Provider, MD   BP 126/68 mmHg   Pulse 74  Temp(Src) 98.4 F (36.9 C) (Oral)  Resp 16  Ht 5\' 11"  (1.803 m)  Wt 200 lb (90.719 kg)  BMI 27.91 kg/m2  SpO2 97% Physical Exam  Constitutional: He is oriented to person, place, and time. He appears well-developed and well-nourished. No distress.  HENT:  Head: Atraumatic.  Eyes: Conjunctivae and EOM are normal. Pupils are equal, round, and reactive to light.  Visual acuity 20/70 (both eyes) Poor visual tracking requiring redirection.  Neck: Neck supple.  Neurological: He is alert and oriented to person, place, and time.  Neurologic exam:  Speech clear, pupils equal round reactive to light, extraocular movements intact  Left homonymous hemianopsia Cranial nerves III through XII normal including no facial droop Follows commands, moves all extremities x4, normal strength to bilateral upper and lower extremities at all major muscle groups including grip Sensation normal to light touch  Coordination intact, no limb ataxia, finger-nose-finger normal Rapid alternating movements normal No pronator drift Gait normal   Skin: No rash noted.  Psychiatric: He has a normal mood and affect.  Nursing note and vitals reviewed.   ED Course  Procedures (including critical care time)  2:37 PM Patient with prior history of stroke, cardiac disease, here presenting with 1 week long of left homonymous hemianopsia, and intermittent right arm weakness and tingling sensation. Given his risk factors and presenting sxs, will obtain brain MRI, and will initiate workup.  Care discussed with Dr. Tawnya Crook.  3:28 PM Appreciate consultation from neurologist Dr. Doy Mince who agrees to see pt in ER for further management.  Pt is made aware of plan.    3:50 PM Care discussed with oncoming provider who will f/u on MRI result and also recommendation from neurologist Dr. Doy Mince.    Labs Review Labs Reviewed  CBC WITH DIFFERENTIAL/PLATELET - Abnormal; Notable for the following:    Hemoglobin 10.8  (*)    HCT 36.2 (*)    MCV 74.3 (*)    MCH 22.2 (*)    MCHC 29.8 (*)    RDW 20.8 (*)    All other components within normal limits  CBG MONITORING, ED - Abnormal; Notable for the following:    Glucose-Capillary 103 (*)    All other components within normal limits  TROPONIN I  BASIC METABOLIC PANEL  URINALYSIS, ROUTINE W REFLEX MICROSCOPIC (NOT AT [facility])  I-STAT CHEM 8, ED    Imaging Review No results found.   EKG Interpretation None     ED ECG REPORT   Date: 02/20/2015  Rate: 76  Rhythm: normal sinus rhythm  QRS Axis: normal  Intervals: normal  ST/T Wave abnormalities: nonspecific T wave changes  Conduction Disutrbances:none  Narrative Interpretation:   Old EKG Reviewed: unchanged  I have personally reviewed the EKG tracing and agree with the computerized printout as noted.   MDM  Final diagnoses:  Left homonymous hemianopsia    BP 140/92 mmHg  Pulse 78  Temp(Src) 98.4 F (36.9 C) (Oral)  Resp 20  Ht 5\' 11"  (1.803 m)  Wt 200 lb (90.719 kg)  BMI 27.91 kg/m2  SpO2 99%     Domenic Moras, PA-C 02/22/15 1504  Ernestina Patches, MD 02/23/15 1032

## 2015-02-20 NOTE — ED Notes (Addendum)
Per EMS: blurred vision, hx of stroke, complete right sided hemianopsia or blurriness in both eyes, right sided weakness subjectively from prior stroke.  The blurred vision has been going on for a week now. Also has had 3 MI's, 4 stents placed, hx htn but does not take any medications at all.  Denies h/a, nausea.  No pain, no sob.  12 lead unremarkable.

## 2015-02-20 NOTE — ED Notes (Signed)
Attempted report 

## 2015-02-20 NOTE — ED Provider Notes (Signed)
Pt care taken over from Domenic Moras at 1500 with plans to f/u on MRI brain.  Pt is a 54 yo male with hx of stroke and residual right sided deficits.  He presents with visual disturbances and found to have left homonymous hemianopsia in the ED.  Provider discussed with neurology and plan for MRI.  MRI positive for stroke and pt admitted to hospitalist for stroke workup and to be followed by neurology.    If performed, labs, EKGs, and imaging were reviewed/interpreted by myself and my attending and incorporated into medical decision making.  Discussed pertinent finding with patient or caregiver prior to admission with no further questions.  Pt care supervised by my attending Dr. Francena Hanly, MD PGY-2  Emergency Medicine   Geronimo Boot, MD 91/79/15 0569  Delora Fuel, MD 79/48/01 6553

## 2015-02-20 NOTE — H&P (Signed)
[facility] History and Physical  [name] ZCH:[id number] DOB: Jan 07, 1961 DOA: 02/20/2015  Referring physician: Dr. Roxanne Mins PCP: [name], MD   Chief Complaint: blurred vision and decrease sensation on RLE  HPI: [name] is a 54 y.o. male with pmh significant for prior stroke, medication no compliance, polysubstance abuse (including cocaine, alcohol, tobacco), HTN, prior MI and dyslipidemia; who presented to ED complaining of blurred vision and RLE decrease sensation. Patient reports symptoms has been going on for approx 1 week and steadily worsening. Patient reprots that sensation on his right side has been present since prior stroke in 2008, but has been more pronounced and mainly having numbness on his RLE now. [name] denies slurred speech, CP, SOB, nausea, vomiting, difficulty swallowing, fever or any other complaints. Patient reports that he is actively drinking and smoking on daily basis, but denies use of any illicit drugs recently. MRI done in ED positive for acute infarct in left lateral thalamus/optic radiation and acute infarct in left corona radiata. [facility] called to admit patient for stroke work up.   Review of Systems:  Negative except as mentioned on HPI.  Past Medical History  Diagnosis Date  . Hypertension   . History of tobacco abuse     still smokes  . Myocardial infarction 12/07/2000    STEMI March 2010, angioplasty of Circumflex and emergent CABG November 07, 2008, bare metal stent to SVG to Circumflex with bare metal stent  November 2010.   [name] Chest pain   . Coronary artery disease   . Cerebrovascular disease   . Abnormality of gait   . Cocaine use     denies current use    Past Surgical History  Procedure Laterality Date  . Cardiac catheterization  11/07/2008    EF of 50-55% -- normal LV systolic function, acute inferolateral ST elevation MI,  PTCA of the circumflex artery -- [name]. [name], [name]  . Coronary artery bypass graft    . Left heart  catheterization with coronary/graft angiogram N/A 05/15/2013    Procedure: LEFT HEART CATHETERIZATION WITH Beatrix Fetters;  Surgeon: [name], MD;  Location: [facility] CATH LAB;  Service: Cardiovascular;  Laterality: N/A;   Social History:  reports that he has been smoking Cigarettes.  He has a 30 pack-year smoking history. He has never used smokeless tobacco. He reports that he drinks alcohol. He reports that he does not use illicit drugs.  No Known Allergies  Family History  Problem Relation Age of Onset  . Heart disease Mother   . Heart failure Mother   . Diabetes Mother   . Prostate cancer Neg Hx   . Colon cancer Neg Hx    Prior to Admission medications   Medication Sig Start Date End Date Taking? Authorizing Provider  aspirin 81 MG tablet Take 81 mg by mouth daily. 03/17/12  Yes Burnell Blanks, MD  nitroGLYCERIN (NITROSTAT) 0.4 MG SL tablet Place 1 tablet (0.4 mg total) under the tongue every 5 (five) minutes as needed for chest pain. 05/05/13  Yes Colon Branch, MD  tamsulosin (FLOMAX) 0.4 MG CAPS capsule Take 0.4 mg by mouth daily.   Yes Historical Provider, MD   Physical Exam: Filed Vitals:   02/20/15 1626 02/20/15 1645 02/20/15 1700 02/20/15 1730  BP: 137/63 141/84 127/85 118/90  Pulse: 84 76 80 76  Temp:      TempSrc:      Resp: 12 19 12 15   Height:      Weight:  SpO2: 98% 96% 99% 95%    Wt Readings from Last 3 Encounters:  02/20/15 90.719 kg (200 lb)  06/13/13 96.163 kg (212 lb)  05/12/13 97.977 kg (216 lb)    General:  Appears calm and comfortable; no acute distress, no fever. Patient cooperative with examination Eyes: PERRL, normal lids, irises & conjunctiva, no icterus ENT: grossly normal hearing, lips & tongue; no drainage out of ears or nostrils Neck: no LAD, masses or thyromegaly, no JVD Cardiovascular: RRR, no m/r/g. No LE edema. Telemetry: SR, no arrhythmias  Respiratory: CTA bilaterally, no w/r/r. Normal respiratory effort. Abdomen:  soft, nt, nd; positive BS Skin: no rash or induration seen on limited exam Musculoskeletal: grossly normal tone BUE/BLE Psychiatric: grossly normal mood and affect, speech fluent and appropriate Neurologic: no dysarthria, no facial droop; patient with normal muscle tone and strength; PERRL, normal finger to nose. Patient reported decrease sensation to touch on his RLE and is complaining of blurred vision.          Labs on Admission:  Basic Metabolic Panel:  Recent Labs Lab 02/20/15 1300 02/20/15 1448  NA 136 138  K 4.4 4.2  CL 105 101  CO2 23  --   GLUCOSE 91 93  BUN 10 12  CREATININE 0.88 0.80  CALCIUM 9.0  --    CBC:  Recent Labs Lab 02/20/15 1300 02/20/15 1448  WBC 7.3  --   NEUTROABS 5.1  --   HGB 10.8* 13.3  HCT 36.2* 39.0  MCV 74.3*  --   PLT PLATELET CLUMPS NOTED ON SMEAR, COUNT APPEARS DECREASED  --    Cardiac Enzymes:  Recent Labs Lab 02/20/15 1300  TROPONINI <0.03    CBG:  Recent Labs Lab 02/20/15 1450  GLUCAP 103*    Radiological Exams on Admission: Mr Brain Wo Contrast  02/20/2015   CLINICAL DATA:  54 year old male with blurred vision, right homonymous hemianopsia. Symptoms x1 week. Initial encounter. Right side weakness from prior stroke.  EXAM: MRI HEAD WITHOUT CONTRAST  TECHNIQUE: Multiplanar, multiecho pulse sequences of the brain and surrounding structures were obtained without intravenous contrast.  COMPARISON:  Brain MRI 12/05/2006 and earlier.  FINDINGS: Cerebral volume has mildly diminished since 2008. Major intracranial vascular flow voids appear stable, with a chronic degree of generalized intracranial artery dolichoectasia.  Confluent restricted diffusion in the dorsal and lateral left thalamus tracking into the left optic radiation (Series 5, image 14, encompassing a linear area of 2 cm. Associated T2 and FLAIR hyperintensity. Minimal to mild regional mass effect. No associated hemorrhage.  Small superimposed left corona radiata lacunar  infarct with restricted diffusion (series 5, image 13). Mild T2 and FLAIR hyperintensity without hemorrhage or mass effect.  No contralateral right hemisphere or posterior fossa restricted diffusion. Small chronic inferior right cerebellar infarcts which relate to the 2008 abnormality. Small chronic lacunar infarct of the right thalamus is new since 2008. Nearby right periatrial signal abnormality also most resembles a chronic lacunar infarct and is new from the prior exam. No chronic blood products identified in the brain. No midline shift or ventriculomegaly. Normal basilar cisterns. Negative pituitary, brainstem, cervicomedullary junction, and visualized cervical spine.  Normal bone marrow signal. Visible internal auditory structures appear normal. Mastoids are clear. Paranasal sinuses are clear. Optic chiasm and orbits soft tissues appear normal. Visualized scalp soft tissues are within normal limits. Normal bone marrow signal.  IMPRESSION: 1. Two cm infarct tracking from the left lateral thalamus inferiorly to the region of the left optic radiations. Associated  edema without hemorrhage or mass effect. 2. Superimposed small acute white matter lacunar infarct of the posterior left corona radiata. Ordinarily this would be a different vascular territory than #1 (left PCA versus left MCA). Perhaps this reflects synchronous small vessel disease. 3. Other posterior circulation chronic small vessel ischemia with mild progression since the 2008 right cerebellar infarcts.   Electronically Signed   By: Genevie Ann [name]   On: 02/20/2015 16:06    EKG:  No acute ischemic changes or abnormal rhythm appreciated on EKG  Assessment/Plan 1- acute infarct in left lateral thalamus/optic radiation and acute infarct in left corona radiata. Risk factors: HTN, dyslipidemia, prior stroke, tobacco abuse -most likely due to small vessel disease -will admit to telemetry for stroke work up -will check MRI, MRA, echo, carotid  duplex -neurology on board will follow rec's -patient advise to quit smoking  -will check A1C and Lipid profile -full dose ASA for secondary prevention -started on zocor  2-Dyslipidemia: last LDL 116 -will start zocor -follow lipid profile  3-TOBACCO ABUSE: cessation counseling provided -patient started on nicotine patch  4-Coronary atherosclerosis: hx of CAD -no CP -will continue ASA and will start statins -monitor on telemetry  5-Poor compliance: patient advise of importance in medication compliance.  6-Alcohol abuse: cessation counseling provided -will start patient on CIWA  7-Hx of cocaine abuse: denies illicit drugs -will check UDS  8-BPH (benign prostatic hyperplasia): continue flomax  9-HTN: will monitor and adjust antihypertensive drugs as needed -patient BP stable and controlled with use of flomax alone   Neurology (Dr. Janann Colonel)  Code Status: Full DVT Prophylaxis:lovenox Family Communication: daughter at bedside Disposition Plan: inpatient, LOS > 2 midnights; telemetry bed  Time spent: 60 minutes  Barton Dubois [facility] Pager [phone]

## 2015-02-21 ENCOUNTER — Inpatient Hospital Stay (HOSPITAL_COMMUNITY): Payer: Medicaid Other

## 2015-02-21 DIAGNOSIS — E785 Hyperlipidemia, unspecified: Secondary | ICD-10-CM

## 2015-02-21 DIAGNOSIS — F1010 Alcohol abuse, uncomplicated: Secondary | ICD-10-CM

## 2015-02-21 DIAGNOSIS — I639 Cerebral infarction, unspecified: Principal | ICD-10-CM

## 2015-02-21 DIAGNOSIS — Z9119 Patient's noncompliance with other medical treatment and regimen: Secondary | ICD-10-CM

## 2015-02-21 DIAGNOSIS — I6789 Other cerebrovascular disease: Secondary | ICD-10-CM

## 2015-02-21 DIAGNOSIS — Z720 Tobacco use: Secondary | ICD-10-CM

## 2015-02-21 LAB — LIPID PANEL
Cholesterol: 178 mg/dL (ref 0–200)
HDL: 31 mg/dL — AB (ref >40)
LDL Cholesterol: 113 mg/dL — ABNORMAL HIGH (ref 0–99)
Total CHOL/HDL Ratio: 5.7 RATIO
Triglycerides: 170 mg/dL — ABNORMAL HIGH (ref <150)
VLDL: 34 mg/dL (ref 0–40)

## 2015-02-21 MED ORDER — ASPIRIN 325 MG PO TABS: 325.0000 mg | ORAL_TABLET | Freq: Every day | ORAL | Status: DC

## 2015-02-21 MED ORDER — ENOXAPARIN SODIUM 40 MG/0.4ML ~~LOC~~ SOLN
40.0000 mg | SUBCUTANEOUS | Status: DC
  Administered 2015-02-21: 40 mg via SUBCUTANEOUS
  Filled 2015-02-21: qty 0.4

## 2015-02-21 MED ORDER — SIMVASTATIN 20 MG PO TABS: 20.0000 mg | ORAL_TABLET | Freq: Every day | ORAL | Status: DC

## 2015-02-21 NOTE — Progress Notes (Signed)
VASCULAR LAB PRELIMINARY  PRELIMINARY  PRELIMINARY  PRELIMINARY  Carotid duplex  completed.    Preliminary report:  Bilateral:  1-39% ICA stenosis.  Vertebral artery flow is antegrade.      Kaydenn Mclear, RVT 02/21/2015, 11:47 AM

## 2015-02-21 NOTE — Care Management Note (Signed)
Case Management Note  Patient Details  Name: [name] MRN: [id number] Date of Birth: 1961/02/08  Subjective/Objective:                    Action/Plan: Met with patient and daughter to discuss discharge planning. Patient states that he DOES have a PCP, Dr [name] with [facility].  He admits that he has not been seeing his PCP frequently, but states that he would like to continue care with Dr [name] at  Discharge. CM stressed the importance of consistent follow-up with his PCP.  Patient states that he HAS been following with his cardiologist, but was not compliant with his medications.  He reports that he went to see the cardiologist to get back on his medications and was sent to the ED at that time for stroke-like symptoms.  Patient also reports that he has a neurologist, but was unclear about the name and when he was last seen.  Patient reports that he DOES NOT have financial concerns regarding his medications. CM will continue to follow to ensure that patient is able to afford his medications at discharge.  Patient is requesting information on disability, as he is currently uninsured.  CM spoke with Benjamine Mola in [employer], who will contact the patient.  Expected Discharge Date:                  Expected Discharge Plan:   (patient lives at home alone.)  In-House Referral:  Development worker, community  Discharge planning Services  CM Consult  Post Acute Care Choice:    Choice offered to:     DME Arranged:    DME Agency:     HH Arranged:    HH Agency:     Status of Service:  In process, will continue to follow  Medicare Important Message Given:    Date Medicare IM Given:    Medicare IM give by:    Date Additional Medicare IM Given:    Additional Medicare Important Message give by:     If discussed at [place] of Stay Meetings, dates discussed:    Additional Comments:  Rolm Baptise, RN 02/21/2015, 11:01 AM

## 2015-02-21 NOTE — Progress Notes (Signed)
PT Cancellation Note  Patient Details Name: OMARE BILOTTA MRN: [id number] DOB: 1961/02/26   Cancelled Treatment:    Reason Eval/Treat Not Completed: Patient at procedure or test/unavailable, patient having US done at this time, will return for evaluation.   Duncan Dull 02/21/2015, 11:20 AM Alben Deeds, PT DPT  [phone]

## 2015-02-21 NOTE — Progress Notes (Signed)
STROKE TEAM PROGRESS NOTE   HISTORY KAZUTO SEVEY is a 54 y.o. male hx of prior CVA, MI, polysubstance abuse brought in for evaluation of bilateral blurred vision for the past 1 week. Describes symptoms as "oil over my eyes" and a loss of his inferior right quadrant. He also reports intermittent residual weakness on his right side since prior stroke in 2008 though reports it is worse in the past 1-2 weeks. Denies any headache, no sensory or speech deficits. Takes a daily ASA 81mg  but only started 4 days ago. Reports poor compliance with his BP medications. Reports drinking 3-4 16oz beers a day. Denies recent drug use.   MRI brain imaging completed and reviewed, shows 2cm infarct in left lateral thalamus to left optic radiation and small lacunar infarct of left corona radiata.   Date last known well: 02/13/2015 Time last known well: unclear tPA Given: no, outside tPA window   SUBJECTIVE (INTERVAL HISTORY) The patient's daughter was at the bedside. The patient admitted that he had not been compliant with his aspirin or his blood pressure medications. The patient is still having visual difficulties. Dr. Leonie Man feels that the patient had 2 strokes - one acute and one subacute. Dr. Leonie Man recommended that the patient not drive unless his visual deficits improve.   OBJECTIVE Temp:  [97.4 F (36.3 C)-98.6 F (37 C)] 97.6 F (36.4 C) (07/14 0600) Pulse Rate:  [59-87] 75 (07/14 0600) Cardiac Rhythm:  [-] Normal sinus rhythm (07/13 2005) Resp:  [12-20] 18 (07/14 0600) BP: (118-157)/(63-92) 144/79 mmHg (07/14 0600) SpO2:  [95 %-100 %] 100 % (07/14 0600) Weight:  [90.719 kg (200 lb)] 90.719 kg (200 lb) (07/13 1253)   Recent Labs Lab 02/20/15 1450  GLUCAP 103*    Recent Labs Lab 02/20/15 1300 02/20/15 1448  NA 136 138  K 4.4 4.2  CL 105 101  CO2 23  --   GLUCOSE 91 93  BUN 10 12  CREATININE 0.88 0.80  CALCIUM 9.0  --    No results for input(s): AST, ALT, ALKPHOS, BILITOT, PROT,  ALBUMIN in the last 168 hours.  Recent Labs Lab 02/20/15 1300 02/20/15 1448  WBC 7.3  --   NEUTROABS 5.1  --   HGB 10.8* 13.3  HCT 36.2* 39.0  MCV 74.3*  --   PLT PLATELET CLUMPS NOTED ON SMEAR, COUNT APPEARS DECREASED  --     Recent Labs Lab 02/20/15 1300  TROPONINI <0.03   No results for input(s): LABPROT, INR in the last 72 hours.  Recent Labs  02/20/15 1629  COLORURINE YELLOW  LABSPEC 1.020  PHURINE 5.5  GLUCOSEU NEGATIVE  HGBUR NEGATIVE  BILIRUBINUR NEGATIVE  KETONESUR NEGATIVE  PROTEINUR NEGATIVE  UROBILINOGEN 1.0  NITRITE NEGATIVE  LEUKOCYTESUR SMALL*       Component Value Date/Time   CHOL 178 02/21/2015 0500   TRIG 170* 02/21/2015 0500   HDL 31* 02/21/2015 0500   CHOLHDL 5.7 02/21/2015 0500   VLDL 34 02/21/2015 0500   LDLCALC 113* 02/21/2015 0500   No results found for: HGBA1C    Component Value Date/Time   LABOPIA NONE DETECTED 02/20/2015 1706   COCAINSCRNUR NONE DETECTED 02/20/2015 1706   LABBENZ NONE DETECTED 02/20/2015 1706   AMPHETMU NONE DETECTED 02/20/2015 1706   THCU POSITIVE* 02/20/2015 1706   LABBARB NONE DETECTED 02/20/2015 1706    No results for input(s): ETH in the last 168 hours.   Imaging     Mr [name] [employer] [employer] Contrast 02/20/2015  Negative intracranial MR angiography of the large and medium size vessels.      Mr Brain Wo Contrast 02/20/2015    1. Two cm infarct tracking from the left lateral thalamus inferiorly to the region of the left optic radiations. Associated edema without hemorrhage or mass effect.  2. Superimposed small acute white matter lacunar infarct of the posterior left corona radiata. Ordinarily this would be a different vascular territory than #1 (left PCA versus left MCA). Perhaps this reflects synchronous small vessel disease.  3. Other posterior circulation chronic small vessel ischemia with mild progression since the 2008 right cerebellar infarcts.       PHYSICAL EXAM Pleasant obese middle-age  Caucasian male currently not in distress. . Afebrile. Head is nontraumatic. Neck is supple without bruit.    Cardiac exam no murmur or gallop. Lungs are clear to auscultation. Distal pulses are well felt.  Neurological Exam :  Awake alert oriented to time place and person. Speech is normal without dysarthria or aphagia. Follows commands well. Extraocular movements are full range without nystagmus. Partial right homonymous hemianopsia. Pupils equal reactive. Fundi were not visualized. Vision acuity seems adequate. Mild right lower facial asymmetry. Tongue midline. Motor system exam revealed no upper or lower extremity drift but weakness of right grip and intrinsic hand muscles. Fine finger movements are diminished on the right. Orbits left over right upper extremity. No lower extremity weakness. Reflexes slightly brisker on the right compared to the left. Mild right hemi-body sensory loss. Plantars are both downgoing. Gait was not tested.  ASSESSMENT/PLAN Mr. ROCKNE DEARINGER is a 54 y.o. male with history of prior CVA, hypertension, history of tobacco use, coronary artery disease with previous MI, and polysubstance abuse presenting with visual deficits. He did not receive IV t-PA due to late presentation.  Stroke:  Non-Dominant left thalamic subcortical infarct secondary to small vessel disease.  Resultant  Right sided partial visual deficits  MRI  Two cm infarct tracking from the left lateral thalamus inferiorly to the region of the left optic radiations.  MRA  Negative intracranial MR angiography  Carotid Doppler Bilateral: 1-39% ICA stenosis. Vertebral artery flow is antegrade.   2D Echo pending  LDL 113  HgbA1c pending  Lovenox for VTE prophylaxis  Diet Heart Room service appropriate?: Yes; Fluid consistency:: Thin  aspirin 81 mg orally every day prior to admission, now on aspirin 325 mg orally every day  Patient counseled to be compliant with his antithrombotic  medications  Ongoing aggressive stroke risk factor management  Therapy recommendations: No follow-up physical therapy recommended.  Disposition: Pending  Hypertension  Home meds:  No antihypertensive medications prior to admission  Stable   Hyperlipidemia  Home meds: No lipid-lowering medications prior to admission  LDL 113, goal < 70  Now on Zocor 20 mg daily  Continue statin at discharge    Other Stroke Risk Factors  Cigarette smoker, advised to stop smoking  ETOH use  Hx stroke/TIA  Coronary artery disease  Substance abuse history  Medical noncompliance   Other Active Problems    Other Pertinent History    Hospital day # [place] PA-C [facility] Pager [phone] 02/21/2015, 10:36 AM I have personally examined this patient, reviewed notes, independently viewed imaging studies, participated in medical decision making and plan of care. I have made any additions or clarifications directly to the above note. Agree with note above.  He presents with subacute right-sided partial vision loss and increased right-sided paresthesias and weakness  due to left brain subcortical infarct etiology likely small vessel disease. He remains at risk for neurological worsening, recurrent stroke, TIAs and needs ongoing stroke evaluation and aggressive risk factor modification. I had a long discussion with the patient and family and counseled him to be compliant with his medications and risk factor control. Antony Contras, MD Medical Director [facility] Pager: [phone] 02/21/2015 3:23 PM     To contact Stroke Continuity provider, please refer to [email or web address] After hours, contact General Neurology

## 2015-02-21 NOTE — Progress Notes (Signed)
[facility] PROGRESS NOTE  [name] BOF:[id number] DOB: 1960/12/02 DOA: 02/20/2015 PCP: [name], MD  Assessment/Plan:  Principal Problem:   Stroke:  With resulting visual field deficit. Carotid Dopplers without critical stenosis. Await echo. Hemoglobin A1c pending.  Continue aspirin, statin. Patient requesting discharge.  Patient is equivocal about follow-up and medication compliance as well as abstaining from alcohol and smoking.  Discussed with stroke team. Active Problems:   Dyslipidemia:  Statin resumed.   TOBACCO ABUSE, counseled against   Coronary atherosclerosis   Poor compliance   Alcohol abuse:  No evidence of withdrawal.   Hx of cocaine abuse:  Urine drug screen negative for cocaine but does show evidence of THC   BPH (benign prostatic hyperplasia)  HPI/Subjective: Vision slightly improved. Able to watch a bit of television. Wants to go home "to take care of responsibilities".  Objective: Filed Vitals:   02/21/15 0954  BP: 110/56  Pulse: 82  Temp: 97.5 F (36.4 C)  Resp: 18   No intake or output data in the 24 hours ending 02/21/15 1347 Filed Weights   02/20/15 1253  Weight: 90.719 kg (200 lb)    Exam:   General:  Watching TV. Initially cooperative but became slightly agitated when told he needs to stay for continued testing.  Cardiovascular: Regular rate rhythm without murmurs gallops rubs  Respiratory: Clear to auscultation bilaterally without wheezes rhonchi or rales  Abdomen: Soft nontender nondistended  Ext: No clubbing cyanosis or edema  Neurologic: Partial right hemianopsia motor strength intact. Light touch sensation intact.  Basic Metabolic Panel:  Recent Labs Lab 02/20/15 1300 02/20/15 1448  NA 136 138  K 4.4 4.2  CL 105 101  CO2 23  --   GLUCOSE 91 93  BUN 10 12  CREATININE 0.88 0.80  CALCIUM 9.0  --    Liver Function Tests: No results for input(s): AST, ALT, ALKPHOS, BILITOT, PROT, ALBUMIN in the last 168  hours. No results for input(s): LIPASE, AMYLASE in the last 168 hours. No results for input(s): AMMONIA in the last 168 hours. CBC:  Recent Labs Lab 02/20/15 1300 02/20/15 1448  WBC 7.3  --   NEUTROABS 5.1  --   HGB 10.8* 13.3  HCT 36.2* 39.0  MCV 74.3*  --   PLT PLATELET CLUMPS NOTED ON SMEAR, COUNT APPEARS DECREASED  --    Cardiac Enzymes:  Recent Labs Lab 02/20/15 1300  TROPONINI <0.03   BNP (last 3 results) No results for input(s): BNP in the last 8760 hours.  ProBNP (last 3 results) No results for input(s): PROBNP in the last 8760 hours.  CBG:  Recent Labs Lab 02/20/15 1450  GLUCAP 103*    No results found for this or any previous visit (from the past 240 hour(s)).   Studies: Mr [name] Wo Contrast  02/20/2015   CLINICAL DATA:  Blurred vision.  Acute infarctions on the left.  EXAM: MRA HEAD WITHOUT CONTRAST  TECHNIQUE: Angiographic images of the Circle of Willis were obtained using MRA technique without intravenous contrast.  COMPARISON:  MRI same day  FINDINGS: Both internal carotid arteries are widely patent through the siphon regions. The anterior and middle cerebral vessels are patent without proximal stenosis, aneurysm or vascular malformation.  Both vertebral arteries are patent to the basilar with the left being dominant. No basilar stenosis. Superior cerebellar arteries and posterior cerebral arteries are patent bilaterally without evidence of proximal stenosis or embolic occlusion.  IMPRESSION: Negative intracranial MR angiography of the large and  medium size vessels.   Electronically Signed   By: Nelson Chimes [name]   On: 02/20/2015 18:14   Mr Brain Wo Contrast  02/20/2015   CLINICAL DATA:  54 year old male with blurred vision, right homonymous hemianopsia. Symptoms x1 week. Initial encounter. Right side weakness from prior stroke.  EXAM: MRI HEAD WITHOUT CONTRAST  TECHNIQUE: Multiplanar, multiecho pulse sequences of the brain and surrounding structures were  obtained without intravenous contrast.  COMPARISON:  Brain MRI 12/05/2006 and earlier.  FINDINGS: Cerebral volume has mildly diminished since 2008. Major intracranial vascular flow voids appear stable, with a chronic degree of generalized intracranial artery dolichoectasia.  Confluent restricted diffusion in the dorsal and lateral left thalamus tracking into the left optic radiation (Series 5, image 14, encompassing a linear area of 2 cm. Associated T2 and FLAIR hyperintensity. Minimal to mild regional mass effect. No associated hemorrhage.  Small superimposed left corona radiata lacunar infarct with restricted diffusion (series 5, image 13). Mild T2 and FLAIR hyperintensity without hemorrhage or mass effect.  No contralateral right hemisphere or posterior fossa restricted diffusion. Small chronic inferior right cerebellar infarcts which relate to the 2008 abnormality. Small chronic lacunar infarct of the right thalamus is new since 2008. Nearby right periatrial signal abnormality also most resembles a chronic lacunar infarct and is new from the prior exam. No chronic blood products identified in the brain. No midline shift or ventriculomegaly. Normal basilar cisterns. Negative pituitary, brainstem, cervicomedullary junction, and visualized cervical spine.  Normal bone marrow signal. Visible internal auditory structures appear normal. Mastoids are clear. Paranasal sinuses are clear. Optic chiasm and orbits soft tissues appear normal. Visualized scalp soft tissues are within normal limits. Normal bone marrow signal.  IMPRESSION: 1. Two cm infarct tracking from the left lateral thalamus inferiorly to the region of the left optic radiations. Associated edema without hemorrhage or mass effect. 2. Superimposed small acute white matter lacunar infarct of the posterior left corona radiata. Ordinarily this would be a different vascular territory than #1 (left PCA versus left MCA). Perhaps this reflects synchronous small  vessel disease. 3. Other posterior circulation chronic small vessel ischemia with mild progression since the 2008 right cerebellar infarcts.   Electronically Signed   By: Genevie Ann [name]   On: 02/20/2015 16:06    Scheduled Meds: . aspirin  300 mg Rectal Daily   Or  . aspirin  325 mg Oral Daily  . enoxaparin (LOVENOX) injection  40 mg Subcutaneous Q24H  . folic acid  1 mg Oral Daily  . LORazepam  0-4 mg Intravenous Q6H   Followed by  . [START ON 02/22/2015] LORazepam  0-4 mg Intravenous Q12H  . multivitamin with minerals  1 tablet Oral Daily  . nicotine  14 mg Transdermal Daily  . pantoprazole  40 mg Oral Q1200  . simvastatin  20 mg Oral q1800  . tamsulosin  0.4 mg Oral Daily  . thiamine  100 mg Oral Daily   Or  . thiamine  100 mg Intravenous Daily   Continuous Infusions:   Time spent: 25 minutes  [facility] [email or web address], password [facility] 02/21/2015, 1:47 PM  LOS: 1 day

## 2015-02-21 NOTE — Evaluation (Signed)
Physical Therapy Evaluation Patient Details Name: [name] MRN: [id number] DOB: 03/16/1961 Today's Date: 02/21/2015   History of Present Illness  54 y.o. male hx of HTN, prior CVA, substance abuse brought in with 1-2 week history of visual deficits and increased right sided weakness. MRI brain shows acute infarct in left lateral thalamus/optic radiation and acute infarct in left corona radiata.  Clinical Impression  Patient seen for evaluation and recommendations. At this time, patient mobilizing well with no significant gait deviations compared to baseline (at baseline deficits include RLE weakness). Patient does demonstrate mobility/safety deficits related to visual deficits. Patient with several occurences of bumping into objects on the left including chairs, door frame/door handle and wall. Educated patient regarding concerns for safety with mobility, recommended supervision with mobility and provided compensation technique for scanning. Patient frustrated by hospital fall protocol despite education regarding safety and necessity given visual deficits. Do not feel patient will need follow up PT services or further acute PT at this time as concerns are related to visual deficits. At this time, will sign off.  Recommend d/c home with supervision.    Follow Up Recommendations No PT follow up;Supervision/Assistance - 24 hour;Supervision for mobility/OOB (24 hour recommended initially)    Equipment Recommendations  None recommended by PT    Recommendations for Other Services       Precautions / Restrictions Precautions Precautions: Fall      Mobility  Bed Mobility Overal bed mobility: Independent                Transfers Overall transfer level: Independent                  Ambulation/Gait Ambulation/Gait assistance: Supervision Ambulation Distance (Feet): 210 Feet Assistive device: None Gait Pattern/deviations: Decreased dorsiflexion - right;Decreased stride  length;Step-through pattern;Narrow base of support Gait velocity: decreased Gait velocity interpretation: Below normal speed for age/gender General Gait Details: Patient with RLE deficits at baseline (decreased dorsiflexion) patient with imparied safety and awareness secondary to visual deficits. Patient bumping into objects on the left side including walls, door frames and chair. VCs for scanning with ambulation  Stairs Stairs: Yes Stairs assistance: Supervision Stair Management: One rail Right;Step to pattern;Forwards Number of Stairs: 5 General stair comments: increased time, difficulty with RLE clearance initially  Wheelchair Mobility    Modified Rankin (Stroke Patients Only) Modified Rankin (Stroke Patients Only) Pre-Morbid Rankin Score: No significant disability Modified Rankin: No significant disability     Balance Overall balance assessment: No apparent balance deficits (not formally assessed)                                           Pertinent Vitals/Pain Pain Assessment: No/denies pain    Home Living Family/patient expects to be discharged to:: Private residence Living Arrangements: Alone Available Help at Discharge: Family Type of Home: Mobile home Home Access: Stairs to enter Entrance Stairs-Rails: Can reach both Entrance Stairs-Number of Steps: 4 Home Layout: One level Home Equipment: None      Prior Function Level of Independence: Independent               Hand Dominance   Dominant Hand: Right    Extremity/Trunk Assessment   Upper Extremity Assessment: Defer to OT evaluation           Lower Extremity Assessment: Overall WFL for tasks assessed (RLE weakness, 4-/5 at baseline)  Communication   Communication: No difficulties  Cognition Arousal/Alertness: Awake/alert Behavior During Therapy: WFL for tasks assessed/performed Overall Cognitive Status: Within Functional Limits for tasks assessed                       General Comments      Exercises        Assessment/Plan    PT Assessment Patent does not need any further PT services  PT Diagnosis Difficulty walking (due to impaired vision)   PT Problem List    PT Treatment Interventions     PT Goals (Current goals can be found in the Care Plan section) Acute Rehab PT Goals Patient Stated Goal: to go home PT Goal Formulation: All assessment and education complete, DC therapy    Frequency     Barriers to discharge        Co-evaluation               End of Session   Activity Tolerance: Patient tolerated treatment well Patient left: in bed;with call bell/phone within reach;with family/visitor present Nurse Communication: Mobility status         Time: 1330-1346 PT Time Calculation (min) (ACUTE ONLY): 16 min   Charges:   PT Evaluation $Initial PT Evaluation Tier I: 1 Procedure     PT G CodesDuncan Dull 2015-03-09, 2:40 PM Alben Deeds, [place] DPT  [phone]

## 2015-02-21 NOTE — Progress Notes (Signed)
  Echocardiogram 2D Echocardiogram has been performed.  Diamond Nickel 02/21/2015, 3:51 PM

## 2015-02-22 LAB — HEMOGLOBIN A1C
HEMOGLOBIN A1C: 5.8 % — AB (ref 4.8–5.6)
Mean Plasma Glucose: 120 mg/dL

## 2015-02-22 NOTE — Evaluation (Signed)
Occupational Therapy Evaluation Patient Details Name: [name] MRN: [id number] DOB: 10-15-1960 Today's Date: 02/22/2015    History of Present Illness 54 y.o. male hx of HTN, prior CVA, substance abuse brought in with 1-2 week history of visual deficits and increased right sided weakness. MRI brain shows acute infarct in left lateral thalamus/optic radiation and acute infarct in left corona radiata.   Clinical Impression   Pt standing in bathroom completing ADLs independently upon OT arrival. Pt denies sensation deficits. Pt reports blurry vision, and states he bumped into door with PT yesterday. Vision assessment shows Pt has R homonymous hemianopsia; Pt is aware of vision deficits and has learned strategies to compensate for vision loss. Pt reports he lives alone but has family/ friends who can come stay with him if needed. Recommending Pt not drive at this time, Pt reports he is aware of deficits and will not drive; family/ friends available to provide transportation. Education provided on smoking cessation and stroke risk factors. Pt reports being approved for SNAP benefits, but has not yet received a card to purchase food; OT to follow up with case management prior to Pt's d/c home.     Follow Up Recommendations  No OT follow up;Supervision - Intermittent    Equipment Recommendations  None recommended by OT    Recommendations for Other Services       Precautions / Restrictions Precautions Precautions: Fall Restrictions Weight Bearing Restrictions: No      Mobility Bed Mobility                  Transfers Overall transfer level: Independent Equipment used: None             General transfer comment: Pt standing in bathroom completing ADLs upon OT arrival    Balance Overall balance assessment: No apparent balance deficits (not formally assessed)                                          ADL Overall ADL's : At baseline                                        General ADL Comments: Pt completing ADLs independently upon OT arrival     Vision Vision Assessment?: Yes Eye Alignment: Within Functional Limits Ocular Range of Motion: Within Functional Limits Alignment/Gaze Preference: Within Defined Limits Tracking/Visual Pursuits: Requires cues, head turns, or add eye shifts to track Saccades: Within functional limits Convergence: Impaired (comment) Visual Fields: Right homonymous hemianopsia;Other (comment) (Pt able to compensate with head turns)   Perception     Praxis      Pertinent Vitals/Pain Pain Assessment: No/denies pain     Hand Dominance Right   Extremity/Trunk Assessment Upper Extremity Assessment Upper Extremity Assessment: Overall WFL for tasks assessed   Lower Extremity Assessment Lower Extremity Assessment: Overall WFL for tasks assessed       Communication Communication Communication: No difficulties   Cognition Arousal/Alertness: Awake/alert Behavior During Therapy: WFL for tasks assessed/performed Overall Cognitive Status: Within Functional Limits for tasks assessed                     General Comments       Exercises       Shoulder Instructions      Home  Living Family/patient expects to be discharged to:: Private residence Living Arrangements: Alone Available Help at Discharge: Family Type of Home: Mobile home Home Access: Stairs to enter Entrance Stairs-Number of Steps: 4 Entrance Stairs-Rails: Can reach both [place]: One level     Bathroom Shower/Tub: Tub/shower unit;Walk-in shower   Bathroom Toilet: Standard     Home Equipment: None          Prior Functioning/Environment Level of Independence: Independent             OT Diagnosis: Disturbance of vision   OT Problem List: Impaired vision/perception   OT Treatment/Interventions:      OT Goals(Current goals can be found in the care plan section) Acute Rehab OT Goals Patient  Stated Goal: to go home OT Goal Formulation: With patient Time For Goal Achievement: 03/08/15 Potential to Achieve Goals: Good  OT Frequency:     Barriers to D/C:            Co-evaluation              End of Session Nurse Communication: Mobility status  Activity Tolerance: Patient tolerated treatment well Patient left: in chair;with call bell/phone within reach   Time: 0750-0810 OT Time Calculation (min): 20 min Charges:  OT General Charges $OT Visit: 1 Procedure OT Evaluation $Initial OT Evaluation Tier I: 1 Procedure G-Codes:    Forest Gleason 02/22/2015, 10:08 AM

## 2015-02-22 NOTE — Discharge Summary (Signed)
Physician Discharge Summary  [name] TLX:[id number] DOB: 06-21-1961 DOA: 02/20/2015  PCP: Kenneth November, MD  Admit date: 02/20/2015 Discharge date: 02/22/2015  Time spent: greater than 30 minutes  Recommendations for Outpatient Follow-up:  1.   Discharge Diagnoses:  Principal Problem:   Stroke Active Problems:   Dyslipidemia   TOBACCO ABUSE   Coronary atherosclerosis   Poor compliance   Alcohol abuse   Hx of cocaine abuse   BPH (benign prostatic hyperplasia)   Discharge Condition: stable  Diet recommendation: heart healthy  Filed Weights   02/20/15 1253  Weight: 90.719 kg (200 lb)    History of present illness:  54 y.o. male hx of prior CVA, MI, polysubstance abuse brought in for evaluation of bilateral blurred vision for the past 1 week. Describes symptoms as "oil over my eyes" and a loss of his inferior right quadrant. He also reports intermittent residual weakness on his right side since prior stroke in 2008 though reports it is worse in the past 1-2 weeks. Denies any headache, no sensory or speech deficits. Takes a daily ASA 81mg  but only started 4 days ago. Reports poor compliance with his BP medications. Reports drinking 3-4 16oz beers a day. Denies recent drug use.   MRI brain imaging completed and reviewed, shows 2cm infarct in left lateral thalamus to left optic radiation and small lacunar infarct of left corona radiata.  TPA not given, outside window  Hospital Course:  Admitted to telemetry. Neurology consulted  Stroke: Non-Dominant left thalamic subcortical infarct secondary to small vessel disease.  Resultant Right sided partial visual deficits  MRI Two cm infarct tracking from the left lateral thalamus inferiorly to the region of the left optic radiations.  MRA Negative intracranial MR angiography  Carotid Doppler Bilateral: 1-39% ICA stenosis. Vertebral artery flow is antegrade.   2D Echo no source of embolus  LDL 113  HgbA1c  5.8  aspirin 81 mg orally every day prior to admission, now on aspirin 325 mg orally every day  Patient counseled to be compliant with his medications  Hypertension  Home meds: No antihypertensive medications prior to admission  Stable   Hyperlipidemia  Home meds: No lipid-lowering medications prior to admission  LDL 113, goal < 70  Now on Zocor 20 mg daily  Continue statin at discharge    Other Stroke Risk Factors  Cigarette smoker, advised to stop smoking  ETOH use  Hx stroke/TIA  Coronary artery disease  Substance abuse history  Medical noncompliance  Procedures: none Consultations:  neurology  Discharge Exam: Filed Vitals:   02/22/15 0929  BP: 132/97  Pulse: 76  Temp: 97.9 F (36.6 C)  Resp: 20    General: a and o. cooperative Cardiovascular: RRR Respiratory: CTA Neuro: decreased visual acuity  Discharge Instructions   Discharge Instructions    Activity as tolerated - No restrictions    Complete by:  As directed      Diet - low sodium heart healthy    Complete by:  As directed      Discharge instructions    Complete by:  As directed   Avoid smoking and excessive alcohol ingestion          Current Discharge Medication List    START taking these medications   Details  simvastatin (ZOCOR) 20 MG tablet Take 1 tablet (20 mg total) by mouth daily at 6 PM. Qty: 30 tablet, Refills: 1      CONTINUE these medications which have CHANGED   Details  aspirin 325 MG tablet Take 1 tablet (325 mg total) by mouth daily.      CONTINUE these medications which have NOT CHANGED   Details  nitroGLYCERIN (NITROSTAT) 0.4 MG SL tablet Place 1 tablet (0.4 mg total) under the tongue every 5 (five) minutes as needed for chest pain. Qty: 20 tablet, Refills: 1    tamsulosin (FLOMAX) 0.4 MG CAPS capsule Take 0.4 mg by mouth daily.       No Known Allergies Follow-up Information    Follow up with Kenneth November, MD.   Specialty:  Internal Medicine    Contact information:   [name] [id number] [phone]        The results of significant diagnostics from this hospitalization (including imaging, microbiology, ancillary and laboratory) are listed below for reference.    Significant Diagnostic Studies: Mr [name] Wo Contrast  02/20/2015   CLINICAL DATA:  Blurred vision.  Acute infarctions on the left.  EXAM: MRA HEAD WITHOUT CONTRAST  TECHNIQUE: Angiographic images of the Circle of Willis were obtained using MRA technique without intravenous contrast.  COMPARISON:  MRI same day  FINDINGS: Both internal carotid arteries are widely patent through the siphon regions. The anterior and middle cerebral vessels are patent without proximal stenosis, aneurysm or vascular malformation.  Both vertebral arteries are patent to the basilar with the left being dominant. No basilar stenosis. Superior cerebellar arteries and posterior cerebral arteries are patent bilaterally without evidence of proximal stenosis or embolic occlusion.  IMPRESSION: Negative intracranial MR angiography of the large and medium size vessels.   Electronically Signed   By: [name] [name]   On: 02/20/2015 18:14   Mr Brain Wo Contrast  02/20/2015   CLINICAL DATA:  54 year old male with blurred vision, right homonymous hemianopsia. Symptoms x1 week. Initial encounter. Right side weakness from prior stroke.  EXAM: MRI HEAD WITHOUT CONTRAST  TECHNIQUE: Multiplanar, multiecho pulse sequences of the brain and surrounding structures were obtained without intravenous contrast.  COMPARISON:  Brain MRI 12/05/2006 and earlier.  FINDINGS: Cerebral volume has mildly diminished since 2008. Major intracranial vascular flow voids appear stable, with a chronic degree of generalized intracranial artery dolichoectasia.  Confluent restricted diffusion in the dorsal and lateral left thalamus tracking into the left optic radiation (Series 5, image 14, encompassing a linear  area of 2 cm. Associated T2 and FLAIR hyperintensity. Minimal to mild regional mass effect. No associated hemorrhage.  Small superimposed left corona radiata lacunar infarct with restricted diffusion (series 5, image 13). Mild T2 and FLAIR hyperintensity without hemorrhage or mass effect.  No contralateral right hemisphere or posterior fossa restricted diffusion. Small chronic inferior right cerebellar infarcts which relate to the 2008 abnormality. Small chronic lacunar infarct of the right thalamus is new since 2008. Nearby right periatrial signal abnormality also most resembles a chronic lacunar infarct and is new from the prior exam. No chronic blood products identified in the brain. No midline shift or ventriculomegaly. Normal basilar cisterns. Negative pituitary, brainstem, cervicomedullary junction, and visualized cervical spine.  Normal bone marrow signal. Visible internal auditory structures appear normal. Mastoids are clear. Paranasal sinuses are clear. Optic chiasm and orbits soft tissues appear normal. Visualized scalp soft tissues are within normal limits. Normal bone marrow signal.  IMPRESSION: 1. Two cm infarct tracking from the left lateral thalamus inferiorly to the region of the left optic radiations. Associated edema without hemorrhage or mass effect. 2. Superimposed small acute white matter lacunar infarct of the  posterior left corona radiata. Ordinarily this would be a different vascular territory than #1 (left PCA versus left MCA). Perhaps this reflects synchronous small vessel disease. 3. Other posterior circulation chronic small vessel ischemia with mild progression since the 2008 right cerebellar infarcts.   Electronically Signed   By: Genevie Ann [name]   On: 02/20/2015 16:06   Echo Procedure narrative: Transthoracic echocardiography. Image quality was adequate. The study was technically difficult due to variations in respirations. - Left ventricle: Distal septal, apical and inferobasal  hypokinesis The cavity size was normal. Wall thickness was increased in a pattern of mild LVH. Systolic function was normal. The estimated ejection fraction was in the range of 50% to 55%. - Mitral valve: There was mild regurgitation. - Left atrium: The atrium was mildly dilated. - Atrial septum: No defect or patent foramen ovale was identified.  Microbiology: No results found for this or any previous visit (from the past 240 hour(s)).   Labs: Basic Metabolic Panel:  Recent Labs Lab 02/20/15 1300 02/20/15 1448  NA 136 138  K 4.4 4.2  CL 105 101  CO2 23  --   GLUCOSE 91 93  BUN 10 12  CREATININE 0.88 0.80  CALCIUM 9.0  --    Liver Function Tests: No results for input(s): AST, ALT, ALKPHOS, BILITOT, PROT, ALBUMIN in the last 168 hours. No results for input(s): LIPASE, AMYLASE in the last 168 hours. No results for input(s): AMMONIA in the last 168 hours. CBC:  Recent Labs Lab 02/20/15 1300 02/20/15 1448  WBC 7.3  --   NEUTROABS 5.1  --   HGB 10.8* 13.3  HCT 36.2* 39.0  MCV 74.3*  --   PLT PLATELET CLUMPS NOTED ON SMEAR, COUNT APPEARS DECREASED  --    Cardiac Enzymes:  Recent Labs Lab 02/20/15 1300  TROPONINI <0.03   BNP: BNP (last 3 results) No results for input(s): BNP in the last 8760 hours.  ProBNP (last 3 results) No results for input(s): PROBNP in the last 8760 hours.  CBG:  Recent Labs Lab 02/20/15 1450  GLUCAP 103*       Signed:  Claire Dolores L  [facility] 02/22/2015, 9:32 AM

## 2015-02-22 NOTE — Progress Notes (Signed)
Discharge instructions reviewed with patient. RXs given to patient. All questions answered at this time. Awaiting for transport from family.   Ave Filter, RN

## 2015-02-22 NOTE — Progress Notes (Signed)
Rn reviewed stroke education along with risks factors. Patient appears uninterested verbalizing that he had stroke before with heart attack and "it is what it is". Education provided.   Ave Filter, RN

## 2015-03-01 ENCOUNTER — Telehealth: Payer: Self-pay | Admitting: Cardiovascular Disease

## 2015-03-01 NOTE — Telephone Encounter (Signed)
New Message  Pt was just seen at [facility] for a stroke and requested to speak w/ RN about what to do going forward w/ medication. No f/u w/ Dr. Angelena Form was indicated in the AVS. Please call back and discuss.

## 2015-03-01 NOTE — Patient Outreach (Signed)
[facility]) Care Management  03/01/2015  ERVAN HEBER 30-Aug-1960 [id number]   Patient left x2 voicemails to obtain verbal consent for [facility] Stroke calls. Verbal received at callback on 03/01/15; calls begin 03/02/15. [facility] will follow-up as needed per daily dashboard report.

## 2015-03-01 NOTE — Telephone Encounter (Signed)
Called patient and discussed discharge instructions along with reviewing new and resuming medications.  Phone number given for Dr. [name] and instructed to call his office for follow up appointment and make sure the office knows that this is a follow up appointment after his discharge from the hospital

## 2015-03-11 ENCOUNTER — Ambulatory Visit (INDEPENDENT_AMBULATORY_CARE_PROVIDER_SITE_OTHER): Payer: Medicaid Other | Admitting: Internal Medicine

## 2015-03-11 ENCOUNTER — Encounter: Payer: Self-pay | Admitting: Internal Medicine

## 2015-03-11 VITALS — BP 126/82 | HR 85 | Temp 98.5°F | Ht 71.0 in | Wt 211.5 lb

## 2015-03-11 DIAGNOSIS — E785 Hyperlipidemia, unspecified: Secondary | ICD-10-CM | POA: Diagnosis not present

## 2015-03-11 DIAGNOSIS — I639 Cerebral infarction, unspecified: Secondary | ICD-10-CM

## 2015-03-11 DIAGNOSIS — I25709 Atherosclerosis of coronary artery bypass graft(s), unspecified, with unspecified angina pectoris: Secondary | ICD-10-CM | POA: Diagnosis not present

## 2015-03-11 DIAGNOSIS — I10 Essential (primary) hypertension: Secondary | ICD-10-CM

## 2015-03-11 DIAGNOSIS — D509 Iron deficiency anemia, unspecified: Secondary | ICD-10-CM

## 2015-03-11 DIAGNOSIS — I25810 Atherosclerosis of coronary artery bypass graft(s) without angina pectoris: Secondary | ICD-10-CM

## 2015-03-11 DIAGNOSIS — F17200 Nicotine dependence, unspecified, uncomplicated: Secondary | ICD-10-CM

## 2015-03-11 DIAGNOSIS — G4733 Obstructive sleep apnea (adult) (pediatric): Secondary | ICD-10-CM

## 2015-03-11 LAB — CBC WITH DIFFERENTIAL/PLATELET
Basophils Absolute: 0 10*3/uL (ref 0.0–0.1)
Basophils Relative: 0.1 % (ref 0.0–3.0)
EOS ABS: 0.1 10*3/uL (ref 0.0–0.7)
Eosinophils Relative: 1.7 % (ref 0.0–5.0)
HEMATOCRIT: 35.8 % — AB (ref 39.0–52.0)
Hemoglobin: 11 g/dL — ABNORMAL LOW (ref 13.0–17.0)
Lymphocytes Relative: 16.2 % (ref 12.0–46.0)
Lymphs Abs: 1.4 10*3/uL (ref 0.7–4.0)
MCHC: 30.8 g/dL (ref 30.0–36.0)
MCV: 72.8 fl — ABNORMAL LOW (ref 78.0–100.0)
MONOS PCT: 9.7 % (ref 3.0–12.0)
Monocytes Absolute: 0.8 10*3/uL (ref 0.1–1.0)
NEUTROS ABS: 6.2 10*3/uL (ref 1.4–7.7)
Neutrophils Relative %: 72.3 % (ref 43.0–77.0)
Platelets: 292 10*3/uL (ref 150.0–400.0)
RBC: 4.92 Mil/uL (ref 4.22–5.81)
RDW: 22.6 % — ABNORMAL HIGH (ref 11.5–15.5)
WBC: 8.6 10*3/uL (ref 4.0–10.5)

## 2015-03-11 LAB — AST: AST: 17 U/L (ref 0–37)

## 2015-03-11 LAB — ALT: ALT: 15 U/L (ref 0–53)

## 2015-03-11 MED ORDER — METOPROLOL TARTRATE 25 MG PO TABS: 12.5000 mg | ORAL_TABLET | Freq: Two times a day (BID) | ORAL | Status: DC

## 2015-03-11 NOTE — Progress Notes (Signed)
Subjective:    Patient ID: [name], male    DOB: May 05, 1961, 54 y.o.   MRN: [id number]  DOS:  03/11/2015 Type of visit - description : Hospital follow-up Interval history: Patient was admitted to the hospital 02/20/2015 for 2 days: He was admitted with visual defects and increase in right-sided weakness. MRI showed acute infarcts, intracranial  MRA negative, neurology was consulted, he was discharged on a increased dose of aspirin - 325 mg daily - and simvastatin. No physical therapy was recommended.  Labs reviewed: Creatinine 0.8, LFTs not done. Hemoglobin 13.3. Platelets clumps  A1c 5.8  Review of Systems Since he left the hospital, he is taking the medications as recommended. Vision is about the same, decreased peripheral vision. No diplopia. Right-sided symptoms are about the same: Numbness >> than weakness. Denies chest pain, difficulty breathing. No nausea, vomiting, diarrhea No cough or wheezing. He continue with heavy smoking and drinking he continue with fatigue and has persistent snoring  Past Medical History  Diagnosis Date  . Hypertension   . History of tobacco abuse     still smokes  . Myocardial infarction 12/07/2000    STEMI March 2010, angioplasty of Circumflex and emergent CABG November 07, 2008, bare metal stent to SVG to Circumflex with bare metal stent  November 2010.   [name] Chest pain   . Coronary artery disease   . Cerebrovascular disease   . Abnormality of gait   . Cocaine use     denies current use     Past Surgical History  Procedure Laterality Date  . Cardiac catheterization  11/07/2008    EF of 50-55% -- normal LV systolic function, acute inferolateral ST elevation MI,  PTCA of the circumflex artery -- [name]. [name], [name]  . Coronary artery bypass graft    . Left heart catheterization with coronary/graft angiogram N/A 05/15/2013    Procedure: LEFT HEART CATHETERIZATION WITH Beatrix Fetters;  Surgeon: [name], MD;   Location: [facility] CATH LAB;  Service: Cardiovascular;  Laterality: N/A;    History   Social History  . Marital Status: Single    Spouse Name: N/A  . Number of Children: 2  . Years of Education: N/A   Occupational History  . 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 Smoking status: Current Every Day Smoker -- 1.00 packs/day for 30 years    Types: Cigarettes  . Smokeless tobacco: Never Used     Comment: pt also uses   electric cigarettes  . Alcohol Use: Yes     Comment: 2-3 beers a week  . Drug Use: No     Comment: former users  . Sexual Activity: Not Currently   Other Topics Concern  . Not on file   Social History Narrative   Lives w/ girlfriend         Medication List       This list is accurate as of: 03/11/15 11:42 AM.  Always use your most recent med list.               aspirin 325 MG tablet  Take 1 tablet (325 mg total) by mouth daily.     nitroGLYCERIN 0.4 MG SL tablet  Commonly known as:  NITROSTAT  Place 1 tablet (0.4 mg total) under the tongue every 5 (five) minutes as needed for chest pain.     simvastatin 20 MG tablet  Commonly known as:  ZOCOR  Take 1 tablet (20  mg total) by mouth daily at 6 PM.     tamsulosin 0.4 MG Caps capsule  Commonly known as:  FLOMAX  Take 0.4 mg by mouth daily.           Objective:   Physical Exam BP 126/82 mmHg  Pulse 85  Temp(Src) 98.5 F (36.9 C) (Oral)  Ht 5\' 11"  (1.803 m)  Wt 211 lb 8 oz (95.936 kg)  BMI 29.51 kg/m2  SpO2 95% General:   Well developed, well nourished . NAD.  HEENT:  Normocephalic . Face symmetric, atraumatic Neck: Full range of motion Lungs:  CTA B Normal respiratory effort, no intercostal retractions, no accessory muscle use. Heart: RRR,  no murmur.  No pretibial edema bilaterally  Skin: Not pale. Not jaundice Neurologic:  alert & oriented X3.  Speech normal, gait appropriate   and unassisted. Motor exam: Slightly decrease, right side, more  noticeable in the lower extremities Psych--  Cognition and judgment appear intact.  Cooperative with normal attention span and concentration.  Behavior appropriate. No anxious or depressed appearing.      Assessment & Plan:   Today , I spent more than  40  min with the patient: >50% of the time counseling regards risk of suspected OSA, tobacco; need to quit tobacco. Also reviewing the chart and labs ordered by other providers  Also coordinating his care

## 2015-03-11 NOTE — Patient Instructions (Addendum)
Get your blood work before you leave    Start taking metoprolol half tablet twice a day  Check the  blood pressure 2 or 3 times a week  Be sure your blood pressure is between 110/65 and  145/85.  if it is consistently higher or lower, let me know

## 2015-03-11 NOTE — Progress Notes (Signed)
Pre visit review using our clinic review tool, if applicable. No additional management support is needed unless otherwise documented below in the visit note. 

## 2015-03-11 NOTE — Assessment & Plan Note (Signed)
Continue w/ fatigue and snoring, pt aware this increases his CV risk Refer to pulmonary again (saw Dr [name] before, pt did not follow through w/ sleep study)

## 2015-03-11 NOTE — Assessment & Plan Note (Signed)
counseled about ETOH-tobacco

## 2015-03-11 NOTE — Assessment & Plan Note (Signed)
  Used to take   Hydrochlorothiazide and beta blockers. BP today is okay on no medications however even history of CAD thus  will restart low dose of beta blockers and recommend him to monitor his BPs

## 2015-03-11 NOTE — Assessment & Plan Note (Signed)
CAD: History of CAD, had a stents , subsequently a CABG. Last cardiac catheterization 2014: 1. Severe triple vessel CAD s/p 3V CABG with 3/3 patent bypass grafts.  2. Normal LV function Used to take Imdur, hydrochlorothiazide, nitroglycerin and beta blockers. Plan:  Refer to cardiology for a routine follow-up. Refill nitroglycerin, restart beta blockers

## 2015-03-11 NOTE — Assessment & Plan Note (Signed)
History of a stroke few years ago and again last month.  Workup during the last admission include the following: Brain MRI showed 2 acute strokes. Intracraneal MRA negative Echocardiogram with no source of embolus A1c 5.8 Discussed importance of prevent next event Plan: Quit tobacco! Continue with aspirin with increased dose of 325 mg. Control cardiovascular risk factors: Statins  Refer to ophthalmology d/t visual disturbances Check a CBC.

## 2015-03-11 NOTE — Assessment & Plan Note (Signed)
Hyperlipidemia:  LDL 113, goal 70. Patient was not taking statins prior to admission, now on simvastatin 20 mg daily. Check LFTs Follow-up in 2 months

## 2015-03-13 ENCOUNTER — Encounter: Payer: Self-pay | Admitting: Cardiovascular Disease

## 2015-03-13 ENCOUNTER — Ambulatory Visit (INDEPENDENT_AMBULATORY_CARE_PROVIDER_SITE_OTHER): Payer: Medicaid Other | Admitting: Cardiovascular Disease

## 2015-03-13 VITALS — BP 130/80 | HR 79 | Ht 70.0 in | Wt 212.0 lb

## 2015-03-13 DIAGNOSIS — I10 Essential (primary) hypertension: Secondary | ICD-10-CM

## 2015-03-13 DIAGNOSIS — Z720 Tobacco use: Secondary | ICD-10-CM

## 2015-03-13 DIAGNOSIS — I25812 Atherosclerosis of bypass graft of coronary artery of transplanted heart without angina pectoris: Secondary | ICD-10-CM | POA: Diagnosis not present

## 2015-03-13 MED ORDER — METOPROLOL TARTRATE 25 MG PO TABS: 12.5000 mg | ORAL_TABLET | Freq: Two times a day (BID) | ORAL | Status: DC

## 2015-03-13 MED ORDER — NITROGLYCERIN 0.4 MG SL SUBL: 0.4000 mg | SUBLINGUAL_TABLET | SUBLINGUAL | Status: DC | PRN

## 2015-03-13 MED ORDER — SIMVASTATIN 20 MG PO TABS: 20.0000 mg | ORAL_TABLET | Freq: Every day | ORAL | Status: DC

## 2015-03-13 NOTE — Patient Instructions (Signed)
Medication Instructions:  Your physician recommends that you continue on your current medications as directed. Please refer to the Current Medication list given to you today.   Labwork: none  Testing/Procedures: none  Follow-Up: Your physician wants you to follow-up in:  12 months.  You will receive a reminder letter in the mail two months in advance. If you don't receive a letter, please call our office to schedule the follow-up appointment.        

## 2015-03-13 NOTE — Progress Notes (Signed)
Chief Complaint  Patient presents with  . Snoring      History of Present Illness: 54 year old male with history of HTN, hyperlipidemia, tobacco abuse, former cocaine abuse and CAD presenting for cardiac follow up. He had a inferolateral STEMI in March 2010 at which time his Circumflex was occluded and was opened with a balloon. He was taken emergently to the OR for 3V CABG (LIMA to LAD, SVG to Circumflex, SVG to PDA). given his diffuse multivessel disease. In November 2010 he presented with a NSTEMI and had bare metal stenting of the totally occluded saphenous vein graft to the obtuse marginal artery. Cardiac cath October 2014 with 3 patent bypass grafts. Admitted July 2016 to [facility] with CVA. Echo 02/21/15 with normal LV function. Carotid dopplers July 2016 with mild bilateral carotid disease.   He is here today for follow up. He is feeling ok. No chest pain or SOB.  He continues to smoke. No illicit drug use. He states that he is only here "because I had to come to get my meds refilled."  Primary Care Physician: [name]  Past Medical History  Diagnosis Date  . Hypertension   . History of tobacco abuse     still smokes  . Myocardial infarction 12/07/2000    STEMI March 2010, angioplasty of Circumflex and emergent CABG November 07, 2008, bare metal stent to SVG to Circumflex with bare metal stent  November 2010.   [name] Chest pain   . Coronary artery disease   . Cerebrovascular disease ~2008 and 02-2015  . Abnormality of gait   . Cocaine use     denies current use     Past Surgical History  Procedure Laterality Date  . Cardiac catheterization  11/07/2008    EF of 50-55% -- normal LV systolic function, acute inferolateral ST elevation MI,  PTCA of the circumflex artery -- [name]. [name], [name]  . Coronary artery bypass graft    . Left heart catheterization with coronary/graft angiogram N/A 05/15/2013    Procedure: LEFT HEART CATHETERIZATION WITH Beatrix Fetters;  Surgeon: Peter M  Martinique, MD;  Location: [facility] CATH LAB;  Service: Cardiovascular;  Laterality: N/A;    Current Outpatient Prescriptions  Medication Sig Dispense Refill  . aspirin 325 MG tablet Take 1 tablet (325 mg total) by mouth daily.    . metoprolol tartrate (LOPRESSOR) 25 MG tablet Take 0.5 tablets (12.5 mg total) by mouth 2 (two) times daily. 60 tablet 11  . nitroGLYCERIN (NITROSTAT) 0.4 MG SL tablet Place 1 tablet (0.4 mg total) under the tongue every 5 (five) minutes as needed for chest pain. 25 tablet 6  . simvastatin (ZOCOR) 20 MG tablet Take 1 tablet (20 mg total) by mouth at bedtime. 30 tablet 11   No current facility-administered medications for this visit.    No Known Allergies  History   Social History  . Marital Status: Single    Spouse Name: N/A  . Number of Children: 2  . Years of Education: N/A   Occupational History  . not working , used to be a Chief Executive Officer     Social History Main Topics  . Smoking status: Current Every Day Smoker -- 1.00 packs/day for 30 years    Types: Cigarettes  . Smokeless tobacco: Never Used     Comment: pt also uses   electric cigarettes  . Alcohol Use: 0.0 oz/week    0 Standard drinks or equivalent per week     Comment: 3-4 beers  a day  . Drug Use: No     Comment: former users  . Sexual Activity: Not Currently   Other Topics Concern  . Not on file   Social History Narrative   Lives w/ girlfriend     Family History  Problem Relation Age of Onset  . Heart disease Mother   . Heart failure Mother   . Diabetes Mother   . Prostate cancer Neg Hx   . Colon cancer Neg Hx     Review of Systems:  As stated in the HPI and otherwise negative.   BP 130/80 mmHg  Pulse 79  Ht 5\' 10"  (1.778 m)  Wt 212 lb (96.163 kg)  BMI 30.42 kg/m2  SpO2 96%  Physical Examination: General: Well developed, well nourished, NAD HEENT: OP clear, mucus membranes moist SKIN: warm, dry. No rashes. Neuro: No focal deficits Musculoskeletal: Muscle strength  5/5 all ext Psychiatric: Mood and affect normal Neck: No JVD, no carotid bruits, no thyromegaly, no lymphadenopathy. Lungs:Clear bilaterally, no wheezes, rhonci, crackles Cardiovascular: Regular rate and rhythm. No murmurs, gallops or rubs. Abdomen:Soft. Bowel sounds present. Non-tender.  Extremities: No lower extremity edema. Pulses are 2 + in the bilateral DP/PT.  Echo 02/21/15: Procedure narrative: Transthoracic echocardiography. Image quality was adequate. The study was technically difficult due to variations in respirations. - Left ventricle: Distal septal, apical and inferobasal hypokinesis The cavity size was normal. Wall thickness was increased in a pattern of mild LVH. Systolic function was normal. The estimated ejection fraction was in the range of 50% to 55%. - Mitral valve: There was mild regurgitation. - Left atrium: The atrium was mildly dilated. - Atrial septum: No defect or patent foramen ovale was identified.   Cardiac cath October 2014:  Left main: 20% distal stenosis.  Left Anterior Descending Artery: Large caliber vessel that courses to the apex. The proximal vessel is heavily calcified. The mid vessel has diffuse 60% stenosis. The mid vessel has a focal 90% stenosis. The distal vessel fills from antegrade flow and is seen to fill competitively from the patent IMA graft. There is a small caliber diagonal branch that is seen to fill from antegrade flow. This diagonal has moderate diffuse plaque.  Circumflex Artery: Moderate caliber vessel with diffuse moderate proximal stenosis, 100% mid occlusion. The obtuse marginal branch and the distal AV groove Circumflex fills from the patent vein graft.  Right Coronary Artery: 100% proximal occlusion. The distal vessel fills from the patent vein graft.  Graft Anatomy:  SVG to RCA is patent with no obstructive disease SVG to OM is patent. There is a stent in the proximal body of the graft, no restenosis.  LIMA to  mid LAD is patent with no obstructive disease noted in the body of the graft.   Left Ventricular Angiogram: LVEF=55-60%.   EKG:  EKG is not ordered today. The ekg ordered today demonstrates   Recent Labs: 02/20/2015: BUN 12; Creatinine, Ser 0.80; Potassium 4.2; Sodium 138 03/11/2015: ALT 15; Hemoglobin 11.0*; Platelets 292.0   Lipid Panel    Component Value Date/Time   CHOL 178 02/21/2015 0500   TRIG 170* 02/21/2015 0500   HDL 31* 02/21/2015 0500   CHOLHDL 5.7 02/21/2015 0500   VLDL 34 02/21/2015 0500   LDLCALC 113* 02/21/2015 0500   LDLDIRECT 116.0 05/12/2013 1517     Wt Readings from Last 3 Encounters:  03/13/15 212 lb (96.163 kg)  03/11/15 211 lb 8 oz (95.936 kg)  02/20/15 200 lb (90.719 kg)  Other studies Reviewed: Additional studies/ records that were reviewed today include: . Review of the above records demonstrates:    Assessment and Plan:   1. CAD: Stable. No chest pains. He is s/p 3V CABG in March 2010 and has had bare metal stent to SVG to Circumflex in November 2010. Last cath 2014 with patent grafts. History of medication non-compliance.  He is asked to stop smoking once again.   2. HTN: BP controlled. No changes.   3. Medication non-compliance: I have counseled him on the importance of compliance  4. Tobacco abuse: Cessation recommended. Ten minutes counseling to stop outlining risk of continued use.   Current medicines are reviewed at length with the patient today.  The patient does not have concerns regarding medicines.  The following changes have been made:  no change  Labs/ tests ordered today include:  No orders of the defined types were placed in this encounter.    Disposition:   FU with me in 12  months  Signed, [name], MD 03/13/2015 4:49 PM    [facility] [address], [place], [place]  [address] Phone: [phone]; Fax: [phone]

## 2015-03-14 ENCOUNTER — Other Ambulatory Visit (INDEPENDENT_AMBULATORY_CARE_PROVIDER_SITE_OTHER): Payer: Medicaid Other

## 2015-03-14 DIAGNOSIS — D649 Anemia, unspecified: Secondary | ICD-10-CM | POA: Diagnosis not present

## 2015-03-14 LAB — FERRITIN: FERRITIN: 8.9 ng/mL — AB (ref 22.0–322.0)

## 2015-03-14 LAB — IRON: IRON: 20 ug/dL — AB (ref 42–165)

## 2015-03-14 NOTE — Addendum Note (Signed)
Addended by: Wilfrid Lund on: 03/14/2015 01:58 PM   Modules accepted: Orders

## 2015-03-21 ENCOUNTER — Encounter: Payer: Self-pay | Admitting: Gastroenterology

## 2015-04-30 ENCOUNTER — Ambulatory Visit: Payer: Self-pay | Admitting: Pulmonary Disease

## 2015-05-10 ENCOUNTER — Telehealth: Payer: Self-pay | Admitting: Internal Medicine

## 2015-05-10 NOTE — Telephone Encounter (Signed)
pre visit letter mailed 05/10/15 °

## 2015-05-16 ENCOUNTER — Ambulatory Visit: Payer: Self-pay | Admitting: Gastroenterology

## 2015-05-31 ENCOUNTER — Encounter: Payer: Self-pay | Admitting: Internal Medicine

## 2015-05-31 ENCOUNTER — Ambulatory Visit (INDEPENDENT_AMBULATORY_CARE_PROVIDER_SITE_OTHER): Payer: Self-pay | Admitting: Internal Medicine

## 2015-05-31 VITALS — BP 128/66 | HR 74 | Temp 97.8°F | Ht 70.0 in | Wt 216.4 lb

## 2015-05-31 DIAGNOSIS — Z0000 Encounter for general adult medical examination without abnormal findings: Secondary | ICD-10-CM

## 2015-05-31 DIAGNOSIS — D509 Iron deficiency anemia, unspecified: Secondary | ICD-10-CM

## 2015-05-31 DIAGNOSIS — Z09 Encounter for follow-up examination after completed treatment for conditions other than malignant neoplasm: Secondary | ICD-10-CM

## 2015-05-31 LAB — HEMOGLOBIN: Hemoglobin: 9.2 g/dL — ABNORMAL LOW (ref 13.0–17.0)

## 2015-05-31 LAB — FERRITIN: Ferritin: 9 ng/mL — ABNORMAL LOW (ref 22–322)

## 2015-05-31 MED ORDER — CITALOPRAM HYDROBROMIDE 20 MG PO TABS: 20.0000 mg | ORAL_TABLET | Freq: Every day | ORAL | Status: DC

## 2015-05-31 NOTE — Progress Notes (Signed)
Pre visit review using our clinic review tool, if applicable. No additional management support is needed unless otherwise documented below in the visit note. 

## 2015-05-31 NOTE — Progress Notes (Signed)
Subjective:    Patient ID: [name], male    DOB: 08/11/1960, 54 y.o.   MRN: [id number]  DOS:  05/31/2015 Type of visit - description : CPX Interval history: No new complaints    Review of Systems Constitutional: No fever. No chills. No unexplained wt changes. No unusual sweats  HEENT: No dental problems, no ear discharge, no facial swelling, no voice changes. No eye discharge, no eye  redness , no  intolerance to light   Respiratory:  Occasional cough wheezing, going on for a while, no hemoptysis.   no  difficulty breathing.   Cardiovascular: No CP, no leg swelling , no  Palpitations  GI: no nausea, no vomiting, no diarrhea , no  abdominal pain.  No blood in the stools. No dysphagia, no odynophagia    Endocrine: No polyphagia, no polyuria , no polydipsia  GU: No dysuria, gross hematuria, difficulty urinating. No urinary urgency, no frequency.  Musculoskeletal: No joint swellings or unusual aches or pains  Skin: No change in the color of the skin, palor , no  Rash  Allergic, immunologic: No environmental allergies , no  food allergies  Neurological:  Since the stroke a few months ago he continue with occasional dizziness and difficulty with his vision.  no  syncope. No headaches  Hematological: No enlarged lymph nodes, no easy bruising , no unusual bleedings  Psychiatry: No suicidal ideas, no hallucinations, no beavior problems, no confusion.  Admits to anxiety and depression, mostly because his medical problems. No suicidal  Past Medical History  Diagnosis Date  . Hypertension   . History of tobacco abuse     still smokes  . Myocardial infarction [facility]) 12/07/2000    STEMI March 2010, angioplasty of Circumflex and emergent CABG November 07, 2008, bare metal stent to SVG to Circumflex with bare metal stent  November 2010.   [name] Chest pain   . Coronary artery disease   . Cerebrovascular disease ~2008 and 02-2015  . Abnormality of gait   . Cocaine use    denies current use     Past Surgical History  Procedure Laterality Date  . Cardiac catheterization  11/07/2008    EF of 50-55% -- normal LV systolic function, acute inferolateral ST elevation MI,  PTCA of the circumflex artery -- [name]. [name], [name]  . Coronary artery bypass graft    . Left heart catheterization with coronary/graft angiogram N/A 05/15/2013    Procedure: LEFT HEART CATHETERIZATION WITH Beatrix Fetters;  Surgeon: [name], MD;  Location: [facility] CATH LAB;  Service: Cardiovascular;  Laterality: N/A;    Social History   Social History  . Marital Status: Single    Spouse Name: N/A  . Number of Children: 2  . Years of Education: N/A   Occupational History  . not working , used to be a Chief Executive Officer     Social History Main Topics  . Smoking status: Current Every Day Smoker -- 1.00 packs/day for 30 years    Types: Cigarettes  . Smokeless tobacco: Never Used     Comment: 1 PPD  . Alcohol Use: 0.0 oz/week    0 Standard drinks or equivalent per week     Comment: 2 beers a day  . Drug Use: No     Comment: former users  . Sexual Activity: Not Currently   Other Topics Concern  . Not on file   Social History Narrative   Lives w/ girlfriend      Family  History  Problem Relation Age of Onset  . Heart disease Mother   . Heart failure Mother   . Diabetes Mother   . Prostate cancer Neg Hx   . Colon cancer Father     late 10s       Medication List       This list is accurate as of: 05/31/15 11:59 PM.  Always use your most recent med list.               aspirin 325 MG tablet  Take 1 tablet (325 mg total) by mouth daily.     citalopram 20 MG tablet  Commonly known as:  CELEXA  Take 1 tablet (20 mg total) by mouth daily.     metoprolol tartrate 25 MG tablet  Commonly known as:  LOPRESSOR  Take 0.5 tablets (12.5 mg total) by mouth 2 (two) times daily.     nitroGLYCERIN 0.4 MG SL tablet  Commonly known as:  NITROSTAT  Place 1 tablet  (0.4 mg total) under the tongue every 5 (five) minutes as needed for chest pain.     simvastatin 20 MG tablet  Commonly known as:  ZOCOR  Take 1 tablet (20 mg total) by mouth at bedtime.           Objective:   Physical Exam BP 128/66 mmHg  Pulse 74  Temp(Src) 97.8 F (36.6 C) (Oral)  Ht 5\' 10"  (1.778 m)  Wt 216 lb 6 oz (98.147 kg)  BMI 31.05 kg/m2  SpO2 98% General:   Well developed, well nourished . NAD.  Neck:  Full range of motion. Supple. No  Thyromegaly  HEENT:  Normocephalic . Face symmetric, atraumatic Lungs:  CTA B Normal respiratory effort, no intercostal retractions, no accessory muscle use. Heart: RRR,  no murmur.  No pretibial edema bilaterally  Abdomen:  Not distended, soft, non-tender. No rebound or rigidity.  Rectal:  External abnormalities: none. Normal sphincter tone. No rectal masses or tenderness.  No stools found Prostate: Prostate gland firm and smooth, no enlargement, nodularity, tenderness, mass, asymmetry or induration.  Skin: Exposed areas without rash. Not pale. Not jaundice Neurologic:  alert & oriented X3.  Speech normal, gait appropriate for age and unassisted Strength symmetric and appropriate for age.  Psych: Cognition and judgment appear intact.  Cooperative with normal attention span and concentration.  Behavior appropriate. No anxious or depressed appearing.    Assessment & Plan:    assessment >  HTN CAD: 2010 angioplasty f/u by  CABG 2010, cath 2014 Stroke 2008, 02-2015 History of cocaine abuse Anxiety depression Snoring, suspect OSA, saw  Pulmonary 2014, no sleep study Abnormal gait Iron def anemia dx 03-2015, referred to GI  H/o etoh abuse H/o cocaine  Smoker  +FH: father prostate ca , dx late 9s  Plan: HTN: Seems stable CAD, stroke: Continue aspirin and trying to control CV risk factors. Has been unable to see a ophthalmologist or neurologist due to lack of insurance. Iron deficiency anemia: Recheck labs, we have  been unable to refer him to a local GI, will refer to [facility]. No gross blood per rectum. Smoker: Counseled Anxiety depression: Consuled, unable to see a counselor due to cost, start citalopram, suicidality discuss. RTC 2 months

## 2015-05-31 NOTE — Assessment & Plan Note (Signed)
Declined immunizations Never had a colonoscopy, see comments under anemia + FH of prostate cancer, DRE normal, check a PSA Diet and exercise discussed

## 2015-05-31 NOTE — Patient Instructions (Signed)
Get your blood work before you leave   start citalopram: Half tablet a day for 10 days, then 1 tablet daily.   Next visit  for a  checkup in 2 months, no fasting, 15  minutes  Please schedule an appointment at the front desk

## 2015-06-01 DIAGNOSIS — Z09 Encounter for follow-up examination after completed treatment for conditions other than malignant neoplasm: Secondary | ICD-10-CM | POA: Insufficient documentation

## 2015-06-01 LAB — COMPREHENSIVE METABOLIC PANEL
ALK PHOS: 75 U/L (ref 40–115)
ALT: 10 U/L (ref 9–46)
AST: 12 U/L (ref 10–35)
Albumin: 3.8 g/dL (ref 3.6–5.1)
BUN: 11 mg/dL (ref 7–25)
CHLORIDE: 102 mmol/L (ref 98–110)
CO2: 26 mmol/L (ref 20–31)
Calcium: 8.7 mg/dL (ref 8.6–10.3)
Creat: 0.74 mg/dL (ref 0.70–1.33)
GLUCOSE: 79 mg/dL (ref 65–99)
POTASSIUM: 4.1 mmol/L (ref 3.5–5.3)
SODIUM: 139 mmol/L (ref 135–146)
TOTAL PROTEIN: 7.1 g/dL (ref 6.1–8.1)
Total Bilirubin: 0.6 mg/dL (ref 0.2–1.2)

## 2015-06-01 LAB — LIPID PANEL
CHOL/HDL RATIO: 4.5 ratio (ref <=5.0)
Cholesterol: 139 mg/dL (ref 125–200)
HDL: 31 mg/dL — AB (ref >=40)
LDL Cholesterol: 81 mg/dL (ref <130)
Triglycerides: 133 mg/dL (ref <150)
VLDL: 27 mg/dL (ref <30)

## 2015-06-01 LAB — PSA: PSA: 0.5 ng/mL (ref <=4.00)

## 2015-06-01 LAB — IRON: Iron: <10 ug/dL — ABNORMAL LOW (ref 50–180)

## 2015-06-01 NOTE — Assessment & Plan Note (Signed)
HTN: Seems stable CAD, stroke: Continue aspirin and trying to control CV risk factors. Has been unable to see a ophthalmologist or neurologist due to lack of insurance. Iron deficiency anemia: Recheck labs, we have been unable to refer him to a local GI, will refer to [facility]. No gross blood per rectum. Smoker: Counseled Anxiety depression: Consuled, unable to see a counselor due to cost, start citalopram, suicidality discuss. RTC 2 months

## 2015-06-03 ENCOUNTER — Telehealth: Payer: Self-pay | Admitting: Internal Medicine

## 2015-06-03 NOTE — Telephone Encounter (Signed)
Noted  

## 2015-06-03 NOTE — Telephone Encounter (Signed)
Caller name: [name]  Relationship to patient: Self   Can be reached: [phone]  Reason for call: Pt called in because he is stating that someone called him but isn't sure of who it was or what they wanted. Pt says that he had labs on Friday, maybe someone called for the results.   Pt is returning a call.     Please advise.

## 2015-06-03 NOTE — Telephone Encounter (Signed)
Did you call Pt?  

## 2015-06-03 NOTE — Telephone Encounter (Signed)
See labs 

## 2015-06-04 ENCOUNTER — Telehealth: Payer: Self-pay | Admitting: Internal Medicine

## 2015-06-04 MED ORDER — FERROUS SULFATE 325 (65 FE) MG PO TBEC: 325.0000 mg | DELAYED_RELEASE_TABLET | Freq: Two times a day (BID) | ORAL | Status: DC

## 2015-06-04 NOTE — Addendum Note (Signed)
Addended by: Wilfrid Lund on: 06/04/2015 09:30 AM   Modules accepted: Orders

## 2015-06-04 NOTE — Telephone Encounter (Signed)
Caller name:Quency Relationship to patient:self Can be reached: Pharmacy:  Reason for call: patient returning your call so I read him the information in the lab results you had left on his voicemail

## 2015-06-04 NOTE — Telephone Encounter (Signed)
Noted  

## 2015-06-11 DIAGNOSIS — K859 Acute pancreatitis without necrosis or infection, unspecified: Secondary | ICD-10-CM

## 2015-06-11 HISTORY — DX: Acute pancreatitis without necrosis or infection, unspecified: K85.90

## 2015-06-16 ENCOUNTER — Emergency Department (HOSPITAL_COMMUNITY): Payer: Medicaid Other

## 2015-06-16 ENCOUNTER — Encounter (HOSPITAL_COMMUNITY): Payer: Self-pay

## 2015-06-16 ENCOUNTER — Emergency Department (HOSPITAL_COMMUNITY)
Admission: EM | Admit: 2015-06-16 | Discharge: 2015-06-16 | Disposition: A | Discharge status: Home / Self Care | Payer: Medicaid Other | Attending: Emergency Medicine | Admitting: Emergency Medicine

## 2015-06-16 DIAGNOSIS — Z720 Tobacco use: Secondary | ICD-10-CM | POA: Insufficient documentation

## 2015-06-16 DIAGNOSIS — Z8673 Personal history of transient ischemic attack (TIA), and cerebral infarction without residual deficits: Secondary | ICD-10-CM | POA: Insufficient documentation

## 2015-06-16 DIAGNOSIS — K853 Drug induced acute pancreatitis without necrosis or infection: Secondary | ICD-10-CM | POA: Insufficient documentation

## 2015-06-16 DIAGNOSIS — I252 Old myocardial infarction: Secondary | ICD-10-CM | POA: Diagnosis not present

## 2015-06-16 DIAGNOSIS — Z7982 Long term (current) use of aspirin: Secondary | ICD-10-CM | POA: Insufficient documentation

## 2015-06-16 DIAGNOSIS — K8500 Idiopathic acute pancreatitis without necrosis or infection: Secondary | ICD-10-CM

## 2015-06-16 DIAGNOSIS — R079 Chest pain, unspecified: Secondary | ICD-10-CM | POA: Diagnosis present

## 2015-06-16 DIAGNOSIS — I2510 Atherosclerotic heart disease of native coronary artery without angina pectoris: Secondary | ICD-10-CM | POA: Insufficient documentation

## 2015-06-16 DIAGNOSIS — Z79899 Other long term (current) drug therapy: Secondary | ICD-10-CM | POA: Insufficient documentation

## 2015-06-16 DIAGNOSIS — I10 Essential (primary) hypertension: Secondary | ICD-10-CM | POA: Diagnosis not present

## 2015-06-16 DIAGNOSIS — R1011 Right upper quadrant pain: Secondary | ICD-10-CM

## 2015-06-16 LAB — HEPATIC FUNCTION PANEL
ALBUMIN: 3.8 g/dL (ref 3.5–5.0)
ALK PHOS: 90 U/L (ref 38–126)
ALT: 14 U/L — AB (ref 17–63)
AST: 19 U/L (ref 15–41)
Bilirubin, Direct: 0.1 mg/dL (ref 0.1–0.5)
Indirect Bilirubin: 0.7 mg/dL (ref 0.3–0.9)
TOTAL PROTEIN: 8 g/dL (ref 6.5–8.1)
Total Bilirubin: 0.8 mg/dL (ref 0.3–1.2)

## 2015-06-16 LAB — CBC
HCT: 38.2 % — ABNORMAL LOW (ref 39.0–52.0)
Hemoglobin: 11.6 g/dL — ABNORMAL LOW (ref 13.0–17.0)
MCH: 24.4 pg — ABNORMAL LOW (ref 26.0–34.0)
MCHC: 30.4 g/dL (ref 30.0–36.0)
MCV: 80.4 fL (ref 78.0–100.0)
Platelets: 273 10*3/uL (ref 150–400)
RBC: 4.75 MIL/uL (ref 4.22–5.81)
RDW: 23.6 % — AB (ref 11.5–15.5)
WBC: 12.9 10*3/uL — ABNORMAL HIGH (ref 4.0–10.5)

## 2015-06-16 LAB — BASIC METABOLIC PANEL
Anion gap: 15 (ref 5–15)
BUN: 7 mg/dL (ref 6–20)
CALCIUM: 9.9 mg/dL (ref 8.9–10.3)
CO2: 27 mmol/L (ref 22–32)
CREATININE: 0.85 mg/dL (ref 0.61–1.24)
Chloride: 94 mmol/L — ABNORMAL LOW (ref 101–111)
GFR calc Af Amer: >60 mL/min (ref >60)
GLUCOSE: 125 mg/dL — AB (ref 65–99)
Potassium: 3.5 mmol/L (ref 3.5–5.1)
Sodium: 136 mmol/L (ref 135–145)

## 2015-06-16 LAB — LIPASE, BLOOD: Lipase: 83 U/L — ABNORMAL HIGH (ref 11–51)

## 2015-06-16 LAB — I-STAT TROPONIN, ED: TROPONIN I, POC: 0 ng/mL (ref 0.00–0.08)

## 2015-06-16 LAB — TRIGLYCERIDES: Triglycerides: 177 mg/dL — ABNORMAL HIGH (ref <150)

## 2015-06-16 MED ORDER — ASPIRIN 81 MG PO CHEW: 324.0000 mg | CHEWABLE_TABLET | Freq: Once | ORAL | Status: DC

## 2015-06-16 MED ORDER — IOHEXOL 300 MG/ML  SOLN
100.0000 mL | Freq: Once | INTRAMUSCULAR | Status: AC | PRN
  Administered 2015-06-16: 100 mL via INTRAVENOUS

## 2015-06-16 MED ORDER — GI COCKTAIL ~~LOC~~
30.0000 mL | Freq: Once | ORAL | Status: AC
  Administered 2015-06-16: 30 mL via ORAL
  Filled 2015-06-16: qty 30

## 2015-06-16 MED ORDER — HYDROCODONE-ACETAMINOPHEN 5-325 MG PO TABS: 1.0000 | ORAL_TABLET | ORAL | Status: DC | PRN

## 2015-06-16 MED ORDER — MORPHINE SULFATE (PF) 4 MG/ML IV SOLN
8.0000 mg | Freq: Once | INTRAVENOUS | Status: AC
  Administered 2015-06-16: 8 mg via INTRAVENOUS
  Filled 2015-06-16: qty 2

## 2015-06-16 NOTE — ED Notes (Signed)
Pt c/o mid CP radiating into R neck & bil shoulders, with SOB, pt c/o nausea, denies v/d, pt hx CABG x 6 yrs ago, pt reports relief SL nitro today @ 11am, pt A&O x4, follows commands, speaks in complete sentences

## 2015-06-16 NOTE — ED Notes (Signed)
MD at bedside.knott

## 2015-06-16 NOTE — ED Notes (Signed)
MD at bedside.Laneta Simmers

## 2015-06-16 NOTE — ED Provider Notes (Addendum)
CSN: [id number]     Arrival date & time 06/16/15  1627 History   First MD Initiated Contact with Patient 06/16/15 1646     Chief Complaint  Patient presents with  . Chest Pain     (Consider location/radiation/quality/duration/timing/severity/associated sxs/prior Treatment) Patient is a 54 y.o. male presenting with chest pain. The history is provided by the patient.  Chest Pain Pain location:  Epigastric Pain quality: burning   Pain radiates to:  Mid back Pain radiates to the back: yes   Pain severity:  Moderate Onset quality:  Gradual Duration:  2 days Timing:  Constant Progression:  Unchanged Chronicity:  New Context: eating and at rest   Relieved by: started taking iron supplementation 2+ wks/a. Exacerbated by: eating. Ineffective treatments:  None tried Associated symptoms: abdominal pain, heartburn, nausea and vomiting (x1 3 days ago)   Associated symptoms: no fever, no lower extremity edema and no shortness of breath   Associated symptoms comment:  Water brash Risk factors: coronary artery disease (and prior CABG)     Past Medical History  Diagnosis Date  . Hypertension   . History of tobacco abuse     still smokes  . Myocardial infarction [facility]) 12/07/2000    STEMI March 2010, angioplasty of Circumflex and emergent CABG November 07, 2008, bare metal stent to SVG to Circumflex with bare metal stent  November 2010.   [name] Chest pain   . Coronary artery disease   . Cerebrovascular disease ~2008 and 02-2015  . Abnormality of gait   . Cocaine use     denies current use    Past Surgical History  Procedure Laterality Date  . Cardiac catheterization  11/07/2008    EF of 50-55% -- normal LV systolic function, acute inferolateral ST elevation MI,  PTCA of the circumflex artery -- [name]. [name], [name]  . Coronary artery bypass graft    . Left heart catheterization with coronary/graft angiogram N/A 05/15/2013    Procedure: LEFT HEART CATHETERIZATION WITH Beatrix Fetters;  Surgeon: [name], MD;  Location: [facility];  Service: Cardiovascular;  Laterality: N/A;   Family History  Problem Relation Age of Onset  . Heart disease Mother   . Heart failure Mother   . Diabetes Mother   . Prostate cancer Neg Hx   . Colon cancer Father     late 33s   Social History  Substance Use Topics  . Smoking status: Current Every Day Smoker -- 1.00 packs/day for 30 years    Types: Cigarettes  . Smokeless tobacco: Never Used     Comment: 1 PPD  . Alcohol Use: 0.0 oz/week    0 Standard drinks or equivalent per week     Comment: 2 beers a day    Review of Systems  Constitutional: Negative for fever.  Respiratory: Negative for shortness of breath.   Cardiovascular: Positive for chest pain.  Gastrointestinal: Positive for heartburn, nausea, vomiting (x1 3 days ago) and abdominal pain.  All other systems reviewed and are negative.     Allergies  Review of patient's allergies indicates no known allergies.  Home Medications   Prior to Admission medications   Medication Sig Start Date End Date Taking? Authorizing Provider  acetaminophen (TYLENOL) 500 MG tablet Take 1,000 mg by mouth every 6 (six) hours as needed (pain).   Yes Historical Provider, MD  aspirin 325 MG tablet Take 1 tablet (325 mg total) by mouth daily. 02/21/15  Yes Delfina Redwood, MD  b  complex vitamins tablet Take 1 tablet by mouth daily.   Yes Historical Provider, MD  citalopram (CELEXA) 20 MG tablet Take 1 tablet (20 mg total) by mouth daily. Patient taking differently: Take 20 mg by mouth at bedtime.  05/31/15  Yes Colon Branch, MD  ferrous sulfate 325 (65 FE) MG EC tablet Take 1 tablet (325 mg total) by mouth 2 (two) times daily with a meal. 06/04/15  Yes Colon Branch, MD  metoprolol tartrate (LOPRESSOR) 25 MG tablet Take 0.5 tablets (12.5 mg total) by mouth 2 (two) times daily. 03/13/15  Yes Burnell Blanks, MD  nitroGLYCERIN (NITROSTAT) 0.4 MG SL tablet Place 1 tablet (0.4  mg total) under the tongue every 5 (five) minutes as needed for chest pain. 03/13/15  Yes Burnell Blanks, MD  simvastatin (ZOCOR) 20 MG tablet Take 1 tablet (20 mg total) by mouth at bedtime. 03/13/15  Yes Burnell Blanks, MD   BP 169/77 mmHg  Pulse 80  Temp(Src) 98.8 F (37.1 C) (Oral)  Resp 25  Ht 5\' 11"  (1.803 m)  Wt 207 lb 6 oz (94.065 kg)  BMI 28.94 kg/m2  SpO2 97% Physical Exam  Constitutional: He is oriented to person, place, and time. He appears well-developed and well-nourished. No distress.  HENT:  Head: Normocephalic and atraumatic.  Eyes: Conjunctivae are normal.  Neck: Neck supple. No tracheal deviation present.  Cardiovascular: Normal rate, regular rhythm and normal heart sounds.   Pulmonary/Chest: Effort normal and breath sounds normal. No respiratory distress.  Abdominal: Soft. Bowel sounds are normal. He exhibits no distension. There is tenderness (epigastric). There is guarding (voluntary). There is no rebound.  Neurological: He is alert and oriented to person, place, and time.  Skin: Skin is warm and dry.  Psychiatric: He has a normal mood and affect.    ED Course  Procedures (including critical care time) Labs Review Labs Reviewed  BASIC METABOLIC PANEL - Abnormal; Notable for the following:    Chloride 94 (*)    Glucose, Bld 125 (*)    All other components within normal limits  CBC - Abnormal; Notable for the following:    WBC 12.9 (*)    Hemoglobin 11.6 (*)    HCT 38.2 (*)    MCH 24.4 (*)    RDW 23.6 (*)    All other components within normal limits  LIPASE, BLOOD - Abnormal; Notable for the following:    Lipase 83 (*)    All other components within normal limits  HEPATIC FUNCTION PANEL - Abnormal; Notable for the following:    ALT 14 (*)    All other components within normal limits  TRIGLYCERIDES - Abnormal; Notable for the following:    Triglycerides 177 (*)    All other components within normal limits  I-STAT TROPOININ, ED     Imaging Review Dg Chest 2 View  06/16/2015  CLINICAL DATA:  Patient with mid chest pain radiating to the right neck and bilateral shoulders. Shortness of breath. EXAM: CHEST  2 VIEW COMPARISON:  Chest radiograph 06/23/2009 FINDINGS: Stable enlarged cardiac and mediastinal contours status post median sternotomy and CABG procedure. Multiple sternotomy wires are fractured. No consolidative pulmonary opacities. No pleural effusion or pneumothorax. Regional skeleton is unremarkable. IMPRESSION: Cardiomegaly.  No acute cardiopulmonary process. Multiple fractured sternotomy wires. Electronically Signed   By: Lovey Newcomer [name]   On: 06/16/2015 17:44   US Abdomen Complete  06/16/2015  CLINICAL DATA:  Right upper quadrant tenderness for 3 days. EXAM:  ULTRASOUND ABDOMEN COMPLETE COMPARISON:  None. FINDINGS: Gallbladder: Physiologically distended. No gallstones or wall thickening visualized. No sonographic Murphy sign noted. Common bile duct: Diameter: 5 mm. Liver: No focal lesion identified. Diffuse increasing coarsening parenchymal echogenicity. Focal fatty sparing is seen adjacent to the gallbladder fossa. Normal directional flow in the main portal vein. IVC: No abnormality visualized, the infrahepatic IVC is not well seen. Pancreas: Not visualized.  Obscured by bowel gas and body habitus. Spleen: Size and appearance within normal limits. Right Kidney: Length: 11.2 cm. Echogenicity within normal limits. No mass or hydronephrosis visualized. Left Kidney: Length: 12.2 cm. Echogenicity within normal limits. No mass or hydronephrosis visualized. Abdominal aorta: Majority not well visualized. The distal aorta measures 2.2 cm. Other findings: None.  No ascites. IMPRESSION: 1. Hepatic steatosis. 2. No acute abnormality. 3. Midline structures including pancreas, IVC and aorta are not well visualized. Electronically Signed   By: Jeb Levering [name]   On: 06/16/2015 20:54   Ct Abdomen Pelvis W Contrast  06/16/2015   CLINICAL DATA:  Mid upper abdominal pain for 2 days. EXAM: CT ABDOMEN AND PELVIS WITH CONTRAST TECHNIQUE: Multidetector CT imaging of the abdomen and pelvis was performed using the standard protocol following bolus administration of intravenous contrast. CONTRAST:  191mL OMNIPAQUE IOHEXOL 300 MG/ML  SOLN COMPARISON:  None currently available FINDINGS: Lower chest and abdominal wall: There is a moderate sliding-type hiatal hernia without obstruction or inflammatory change. Mildly enlarged lymph node right of the gastric cardia is nonspecific in isolation. Small fatty umbilical and bilateral inguinal hernias. Extensive atherosclerotic calcification seen in the visible right coronary circulation Hepatobiliary: No focal liver abnormality.No evidence of biliary obstruction or stone. Pancreas: Mild stranding around the head and uncinate process of the pancreas without fluid collection. Few coarse calcifications around the pancreatic head which appear atherosclerotic on reformats. No duct enlargement. Spleen: Unremarkable. Adrenals/Urinary Tract: Negative adrenals. No hydronephrosis or definite stone (hilar calcifications appear atherosclerotic). Unremarkable bladder. Reproductive:No pathologic findings. Stomach/Bowel:  No obstruction. No appendicitis. Vascular/Lymphatic: Extensive atherosclerotic calcification. No acute vascular finding. No mass or adenopathy. Peritoneal: No ascites or pneumoperitoneum. Musculoskeletal: No acute abnormalities. IMPRESSION: 1. Mild pancreatitis. 2. Moderate sliding hiatal hernia. 3. Age advanced atherosclerosis. Electronically Signed   By: Monte Fantasia [name]   On: 06/16/2015 21:53   I have personally reviewed and evaluated these images and lab results as part of my medical decision-making.   EKG Interpretation   Date/Time:  Sunday June 16 2015 16:36:35 EST Ventricular Rate:  85 PR Interval:  144 QRS Duration: 102 QT Interval:  370 QTC Calculation: 440 R Axis:   69 Text  Interpretation:  Sinus rhythm with Premature ventricular complexes  Possible Left atrial enlargement Borderline ECG No significant change  since last tracing Confirmed by Emberlynn Riggan MD, Quillian Quince ([id number]) on 06/16/2015  4:47:34 PM      MDM   Final diagnoses:  RUQ abdominal pain  Idiopathic acute pancreatitis without infection or necrosis   54 year old male with significant cardiac history presents with upper abdominal pain that occurred over the last 2 days. It is different than his typical cardiac ischemic pain. Troponin negative and EKG unchanged from prior. It is epigastric and appears to radiate to his back. Patient is epigastric tenderness and becomes nauseated. His white blood cell count is mildly elevated. Ultrasound of right upper quadrant was ordered to evaluate for gallstone pathology as patient's pain gets worse with eating. Study was negative, lipase is elevated suggesting mild pancreatitis but patient states he does not consume alcohol,  has had no change in medications, and has no other apparent reason to have this. CT scan ordered to rule out mass effect causing pancreatic duct stricture. No masses identified, no other complicating features of pancreatitis noted.  Patient provided narcotic analgesia for home, triglycerides drawn for primary care follow-up, discussed clear liquid diet followed by slowly progressing to bland foods. Return precautions were discussed for fevers, worsening of condition, inability to tolerate fluids by mouth or other concerning signs.    Leo Grosser, MD 06/18/15 [phone]

## 2015-06-16 NOTE — ED Notes (Signed)
MD at bedside.[email or web address]

## 2015-06-16 NOTE — Discharge Instructions (Signed)
Low-Fat Diet for Pancreatitis or Gallbladder Conditions A low-fat diet can be helpful if you have pancreatitis or a gallbladder condition. With these conditions, your pancreas and gallbladder have trouble digesting fats. A healthy eating plan with less fat will help rest your pancreas and gallbladder and reduce your symptoms. WHAT DO I NEED TO KNOW ABOUT THIS DIET?  Eat a low-fat diet.  Reduce your fat intake to less than 20-30% of your total daily calories. This is less than 50-60 g of fat per day.  Remember that you need some fat in your diet. Ask your dietician what your daily goal should be.  Choose nonfat and low-fat healthy foods. Look for the words "nonfat," "low fat," or "fat free."  As a guide, look on the label and choose foods with less than 3 g of fat per serving. Eat only one serving.  Avoid alcohol.  Do not smoke. If you need help quitting, talk with your health care provider.  Eat small frequent meals instead of three large heavy meals. WHAT FOODS CAN I EAT? Grains Include healthy grains and starches such as potatoes, wheat bread, fiber-rich cereal, and brown rice. Choose whole grain options whenever possible. In adults, whole grains should account for 45-65% of your daily calories.  Fruits and Vegetables Eat plenty of fruits and vegetables. Fresh fruits and vegetables add fiber to your diet. Meats and Other Protein Sources Eat lean meat such as chicken and pork. Trim any fat off of meat before cooking it. Eggs, fish, and beans are other sources of protein. In adults, these foods should account for 10-35% of your daily calories. Dairy Choose low-fat milk and dairy options. Dairy includes fat and protein, as well as calcium.  Fats and Oils Limit high-fat foods such as fried foods, sweets, baked goods, sugary drinks.  Other Creamy sauces and condiments, such as mayonnaise, can add extra fat. Think about whether or not you need to use them, or use smaller amounts or low fat  options. WHAT FOODS ARE NOT RECOMMENDED?  High fat foods, such as:  [employer].  Ice cream.  [place] toast.  Sweet rolls.  Pizza.  Cheese bread.  Foods covered with batter, butter, creamy sauces, or cheese.  Fried foods.  Sugary drinks and desserts.  Foods that cause gas or bloating   This information is not intended to replace advice given to you by your health care provider. Make sure you discuss any questions you have with your health care provider.   Document Released: 08/01/2013 Document Reviewed: 08/01/2013 Elsevier Interactive Patient Education [employer]. Acute Pancreatitis Acute pancreatitis is a disease in which the pancreas becomes suddenly inflamed. The pancreas is a large gland located behind your stomach. The pancreas produces enzymes that help digest food. The pancreas also releases the hormones glucagon and insulin that help regulate blood sugar. Damage to the pancreas occurs when the digestive enzymes from the pancreas are activated and begin attacking the pancreas before being released into the intestine. Most acute attacks last a couple of days and can cause serious complications. Some people become dehydrated and develop low blood pressure. In severe cases, bleeding into the pancreas can lead to shock and can be life-threatening. The lungs, heart, and kidneys may fail. CAUSES  Pancreatitis can happen to anyone. In some cases, the cause is unknown. Most cases are caused by:  Alcohol abuse.  Gallstones. Other less common causes are:  Certain medicines.  Exposure to certain chemicals.  Infection.  Damage caused by an  accident (trauma).  Abdominal surgery. SYMPTOMS   Pain in the upper abdomen that may radiate to the back.  Tenderness and swelling of the abdomen.  Nausea and vomiting. DIAGNOSIS  Your caregiver will perform a physical exam. Blood and stool tests may be done to confirm the diagnosis. Imaging tests may also be done, such as  X-rays, CT scans, or an ultrasound of the abdomen. TREATMENT  Treatment usually requires a stay in the hospital. Treatment may include:  Pain medicine.  Fluid replacement through an intravenous line (IV).  Placing a tube in the stomach to remove stomach contents and control vomiting.  Not eating for 3 or 4 days. This gives your pancreas a rest, because enzymes are not being produced that can cause further damage.  Antibiotic medicines if your condition is caused by an infection.  Surgery of the pancreas or gallbladder. HOME CARE INSTRUCTIONS   Follow the diet advised by your caregiver. This may involve avoiding alcohol and decreasing the amount of fat in your diet.  Eat smaller, more frequent meals. This reduces the amount of digestive juices the pancreas produces.  Drink enough fluids to keep your urine clear or pale yellow.  Only take over-the-counter or prescription medicines as directed by your caregiver.  Avoid drinking alcohol if it caused your condition.  Do not smoke.  Get plenty of rest.  Check your blood sugar at home as directed by your caregiver.  Keep all follow-up appointments as directed by your caregiver. SEEK MEDICAL CARE IF:   You do not recover as quickly as expected.  You develop new or worsening symptoms.  You have persistent pain, weakness, or nausea.  You recover and then have another episode of pain. SEEK IMMEDIATE MEDICAL CARE IF:   You are unable to eat or keep fluids down.  Your pain becomes severe.  You have a fever or persistent symptoms for more than 2 to 3 days.  You have a fever and your symptoms suddenly get worse.  Your skin or the white part of your eyes turn yellow (jaundice).  You develop vomiting.  You feel dizzy, or you faint.  Your blood sugar is high (over 300 mg/dL). MAKE SURE YOU:   Understand these instructions.  Will watch your condition.  Will get help right away if you are not doing well or get worse.     This information is not intended to replace advice given to you by your health care provider. Make sure you discuss any questions you have with your health care provider.   Document Released: 07/27/2005 Document Revised: 01/26/2012 Document Reviewed: 11/05/2011 Elsevier Interactive Patient Education [employer].

## 2015-06-21 ENCOUNTER — Telehealth: Payer: Self-pay | Admitting: Internal Medicine

## 2015-06-21 NOTE — Telephone Encounter (Addendum)
Relation to [id number] Call back number:[phone]   Reason for call:  Patient states the HYDROcodone-acetaminophen (NORCO/VICODIN) 5-325 MG tablet he has been taking with tylenol and its not working and he's in pain. Patient states he has 9 pills left. Please advise patient directly .sw

## 2015-06-21 NOTE — Telephone Encounter (Signed)
Called to follow up with patient.  He went to ED on 06/16/15 for Idiopathic acute pancreatitis.  He was treated and discharged with hydrocodone for pain control.  Pt states his pain has not improved.  He continues to have epigastric pain, burning in nature, not radiating, nausea without vomiting.  Pain rated 8 or 9 on a 0-10 pain scale.   He says he's taking hydrocodone with tylenol, but it's not helping.  He's also eating a bland diet as advised.  He denies fever.    Pt was advised to go back to the ER for pain management until he can get back in to see Dr. [name].   We also discussed the maximum dose for tylenol and how he may need something else to take for break through pain.  Pt stated understanding and says that he was going to try to tough it out until someone can take him back to the ER.   He also scheduled an ER follow up with Dr. [name] on Tuesday at 2:15 pm.    Message routed to PCP for FYI.

## 2015-06-21 NOTE — Telephone Encounter (Signed)
Pt seen for acute pancreatitis at ED on 06/16/2015, we have not seen for ED F/U, can you triage?

## 2015-06-21 NOTE — Telephone Encounter (Signed)
Agree, if the pain is as severe (9/10) needs to be seen.

## 2015-06-25 ENCOUNTER — Ambulatory Visit (INDEPENDENT_AMBULATORY_CARE_PROVIDER_SITE_OTHER): Payer: Medicaid Other | Admitting: Internal Medicine

## 2015-06-25 ENCOUNTER — Encounter: Payer: Self-pay | Admitting: Internal Medicine

## 2015-06-25 VITALS — BP 128/70 | HR 79 | Temp 97.8°F | Ht 71.0 in | Wt 207.4 lb

## 2015-06-25 DIAGNOSIS — K859 Acute pancreatitis without necrosis or infection, unspecified: Secondary | ICD-10-CM

## 2015-06-25 DIAGNOSIS — F418 Other specified anxiety disorders: Secondary | ICD-10-CM

## 2015-06-25 DIAGNOSIS — Z09 Encounter for follow-up examination after completed treatment for conditions other than malignant neoplasm: Secondary | ICD-10-CM

## 2015-06-25 DIAGNOSIS — F419 Anxiety disorder, unspecified: Secondary | ICD-10-CM

## 2015-06-25 DIAGNOSIS — F329 Major depressive disorder, single episode, unspecified: Secondary | ICD-10-CM

## 2015-06-25 DIAGNOSIS — F32A Depression, unspecified: Secondary | ICD-10-CM

## 2015-06-25 DIAGNOSIS — D509 Iron deficiency anemia, unspecified: Secondary | ICD-10-CM

## 2015-06-25 DIAGNOSIS — Z114 Encounter for screening for human immunodeficiency virus [HIV]: Secondary | ICD-10-CM

## 2015-06-25 DIAGNOSIS — Z1159 Encounter for screening for other viral diseases: Secondary | ICD-10-CM

## 2015-06-25 LAB — HEPATITIS C ANTIBODY: HCV AB: NEGATIVE

## 2015-06-25 MED ORDER — PANTOPRAZOLE SODIUM 40 MG PO TBEC: 40.0000 mg | DELAYED_RELEASE_TABLET | Freq: Every day | ORAL | Status: DC

## 2015-06-25 MED ORDER — HYDROCODONE-ACETAMINOPHEN 5-325 MG PO TABS: 1.0000 | ORAL_TABLET | Freq: Three times a day (TID) | ORAL | Status: DC | PRN

## 2015-06-25 NOTE — Patient Instructions (Addendum)
Get your blood work before you leave   Take pain medication 3 times a day. If the pain is not severe take Tylenol 1 or 2 tablets If the pain is more intense, take hydrocodone 1 or 2 tablets. Do not take the medications together.  Start taking pantoprazole 1 tablet before breakfast every day.   Next visit  for a  checkup in 2 months.  No fasting, 30 minutes Please schedule an appointment at the front desk

## 2015-06-25 NOTE — Progress Notes (Signed)
Pre visit review using our clinic review tool, if applicable. No additional management support is needed unless otherwise documented below in the visit note. 

## 2015-06-25 NOTE — Progress Notes (Signed)
Subjective:    Patient ID: [name], male    DOB: 08-Oct-1960, 54 y.o.   MRN: [id number]  DOS:  06/25/2015 Type of visit - description :  Interval history: Went to the ER 06/16/2015: Had abdominal pain for 2 days, epigastric, radiating to his back. CBC shows slightly elevated WBCs, ultrasound negative for GB stone, lipase was elevated at 83. LFTs normal, triglycerides 177 Creatinine normal, hemoglobin improved at 11.6.  CT scan showed mild pancreatitis.  Was prescribed pain  medication and discharge home.  Review of Systems  since he left the ER, he continue with pain, description is about the same, mostly at the epigastric area, steady, not worse with eating, worse at night. When he does eat, he developed some nausea. Has not drink alcohol since approximately 06/14/2015. Denies dysphagia or odynophagia but he does have heartburn. No fever or chills.  Past Medical History  Diagnosis Date  . Hypertension   . History of tobacco abuse     still smokes  . Myocardial infarction [facility]) 12/07/2000    STEMI March 2010, angioplasty of Circumflex and emergent CABG November 07, 2008, bare metal stent to SVG to Circumflex with bare metal stent  November 2010.   [name] Chest pain   . Coronary artery disease   . Cerebrovascular disease ~2008 and 02-2015  . Abnormality of gait   . Cocaine use     denies current use   . CHF (congestive heart failure) [facility])     Past Surgical History  Procedure Laterality Date  . Cardiac catheterization  11/07/2008    EF of 50-55% -- normal LV systolic function, acute inferolateral ST elevation MI,  PTCA of the circumflex artery -- [name]. [name], [name]  . Coronary artery bypass graft    . Left heart catheterization with coronary/graft angiogram N/A 05/15/2013    Procedure: LEFT HEART CATHETERIZATION WITH Beatrix Fetters;  Surgeon: [name], MD;  Location: [facility] CATH LAB;  Service: Cardiovascular;  Laterality: N/A;    Social History   Social  History  . Marital Status: Single    Spouse Name: N/A  . Number of Children: 2  . Years of Education: N/A   Occupational History  . not working , used to be a Chief Executive Officer     Social History Main Topics  . Smoking status: Current Every Day Smoker -- 1.00 packs/day for 30 years    Types: Cigarettes  . Smokeless tobacco: Never Used     Comment: 1 PPD  . Alcohol Use: 0.0 oz/week    0 Standard drinks or equivalent per week     Comment: 2 beers a day  . Drug Use: No     Comment: former users  . Sexual Activity: Not Currently   Other Topics Concern  . Not on file   Social History Narrative   Lives w/ girlfriend         Medication List       This list is accurate as of: 06/25/15 11:59 PM.  Always use your most recent med list.               acetaminophen 500 MG tablet  Commonly known as:  TYLENOL  Take 500-1,000 mg by mouth every 8 (eight) hours as needed.     aspirin 325 MG tablet  Take 1 tablet (325 mg total) by mouth daily.     b complex vitamins tablet  Take 1 tablet by mouth daily.     citalopram  20 MG tablet  Commonly known as:  CELEXA  Take 1 tablet (20 mg total) by mouth daily.     HYDROcodone-acetaminophen 5-325 MG tablet  Commonly known as:  NORCO/VICODIN  Take 1-2 tablets by mouth 3 (three) times daily as needed for moderate pain or severe pain.     metoprolol tartrate 25 MG tablet  Commonly known as:  LOPRESSOR  Take 0.5 tablets (12.5 mg total) by mouth 2 (two) times daily.     nitroGLYCERIN 0.4 MG SL tablet  Commonly known as:  NITROSTAT  Place 1 tablet (0.4 mg total) under the tongue every 5 (five) minutes as needed for chest pain.     pantoprazole 40 MG tablet  Commonly known as:  PROTONIX  Take 1 tablet (40 mg total) by mouth daily.     simvastatin 20 MG tablet  Commonly known as:  ZOCOR  Take 1 tablet (20 mg total) by mouth at bedtime.           Objective:   Physical Exam BP 128/70 mmHg  Pulse 79  Temp(Src) 97.8 F (36.6  C) (Oral)  Ht 5\' 11"  (1.803 m)  Wt 207 lb 6 oz (94.065 kg)  BMI 28.94 kg/m2  SpO2 96% General:   Well developed, well nourished . NAD.  HEENT:  Normocephalic . Face symmetric, atraumatic Lungs:  CTA B Normal respiratory effort, no intercostal retractions, no accessory muscle use. Heart: RRR,  no murmur.  no pretibial edema bilaterally  Abdomen:  Not distended, soft, non-tender. No rebound or rigidity. No mass,organomegaly Skin: Not pale. Not jaundice Neurologic:  alert & oriented X3.  Speech normal, gait appropriate for age and unassisted Psych--  Cognition and judgment appear intact.  Cooperative with normal attention span and concentration.  Behavior appropriate. No anxious or depressed appearing.    Assessment & Plan:   Assessment >  HTN CAD: 2010 angioplasty f/u by  CABG 2010, cath 2014 Stroke 2008, 02-2015 History of cocaine abuse Anxiety depression Snoring, suspect OSA, saw  Pulmonary 2014, no sleep study Abnormal gait Iron def anemia dx 03-2015, referred to GI  H/o etoh abuse H/o cocaine  Smoker  +FH: father prostate ca , dx late 43s  PLAN Pancreatitis: dx w/  pancreatitis 06/16/2015, last EtOH was 2 days prior to ER visit. Not improving much. ROS is + for heartburn. Plan: Continue abstinence of EtOH, take Tylenol or hydrocodone, strongly recommend not to mix them, see instructions. Add pantoprazole. He already has follow-up with GI. Check amylase lipase. Iron deficiency anemia: Intolerant to iron due to constipation and black stools. Last hemoglobin slightly improved (may have been from dehydration). Has a GI appointment. Check a CBC Anxiety depression: Recently started citalopram, has not seen much difference however he only started 3 weeks ago. Continue with SSRIs. RTC 2 months.

## 2015-06-26 LAB — CBC WITH DIFFERENTIAL/PLATELET
BASOS ABS: 0.1 10*3/uL (ref 0.0–0.1)
Basophils Relative: 0.9 % (ref 0.0–3.0)
EOS ABS: 0.3 10*3/uL (ref 0.0–0.7)
EOS PCT: 3.5 % (ref 0.0–5.0)
HEMATOCRIT: 37.4 % — AB (ref 39.0–52.0)
HEMOGLOBIN: 11.6 g/dL — AB (ref 13.0–17.0)
Lymphocytes Relative: 11.1 % — ABNORMAL LOW (ref 12.0–46.0)
Lymphs Abs: 1.1 10*3/uL (ref 0.7–4.0)
MCHC: 31 g/dL (ref 30.0–36.0)
MCV: 79.1 fl (ref 78.0–100.0)
Monocytes Absolute: 0.9 10*3/uL (ref 0.1–1.0)
Monocytes Relative: 9.6 % (ref 3.0–12.0)
Neutro Abs: 7.4 10*3/uL (ref 1.4–7.7)
Neutrophils Relative %: 74.9 % (ref 43.0–77.0)
Platelets: 370 10*3/uL (ref 150.0–400.0)
RBC: 4.73 Mil/uL (ref 4.22–5.81)
RDW: 25.7 % — ABNORMAL HIGH (ref 11.5–15.5)
WBC: 9.9 10*3/uL (ref 4.0–10.5)

## 2015-06-26 LAB — HIV ANTIBODY (ROUTINE TESTING W REFLEX): HIV 1&2 Ab, 4th Generation: NONREACTIVE

## 2015-06-26 LAB — AMYLASE: AMYLASE: 50 U/L (ref 27–131)

## 2015-06-26 LAB — LIPASE: LIPASE: 153 U/L — AB (ref 11.0–59.0)

## 2015-06-26 NOTE — Assessment & Plan Note (Signed)
Pancreatitis: dx w/  pancreatitis 06/16/2015, last EtOH was 2 days prior to ER visit. Not improving much. ROS is + for heartburn. Plan: Continue abstinence of EtOH, take Tylenol or hydrocodone, strongly recommend not to mix them, see instructions. Add pantoprazole. He already has follow-up with GI. Check amylase lipase. Iron deficiency anemia: Intolerant to iron due to constipation and black stools. Last hemoglobin slightly improved (may have been from dehydration). Has a GI appointment. Check a CBC Anxiety depression: Recently started citalopram, has not seen much difference however he only started 3 weeks ago. Continue with SSRIs. RTC 2 months.

## 2015-07-15 ENCOUNTER — Ambulatory Visit: Payer: Self-pay | Admitting: Gastroenterology

## 2015-07-16 ENCOUNTER — Ambulatory Visit (INDEPENDENT_AMBULATORY_CARE_PROVIDER_SITE_OTHER): Payer: Medicaid Other | Admitting: Physician Assistant

## 2015-07-16 ENCOUNTER — Encounter: Payer: Self-pay | Admitting: Physician Assistant

## 2015-07-16 VITALS — BP 110/64 | HR 62 | Ht 71.0 in | Wt 210.0 lb

## 2015-07-16 DIAGNOSIS — K219 Gastro-esophageal reflux disease without esophagitis: Secondary | ICD-10-CM

## 2015-07-16 DIAGNOSIS — D509 Iron deficiency anemia, unspecified: Secondary | ICD-10-CM

## 2015-07-16 DIAGNOSIS — R195 Other fecal abnormalities: Secondary | ICD-10-CM | POA: Diagnosis not present

## 2015-07-16 DIAGNOSIS — Z800 Family history of malignant neoplasm of digestive organs: Secondary | ICD-10-CM

## 2015-07-16 MED ORDER — NA SULFATE-K SULFATE-MG SULF 17.5-3.13-1.6 GM/177ML PO SOLN: 1.0000 | Freq: Once | ORAL | Status: DC

## 2015-07-16 NOTE — Progress Notes (Addendum)
Patient ID: ISEAH PLOUFF, male   DOB: 05-16-1961, 54 y.o.   MRN: [id number]    HPI:  CILLIAN GWINNER is a 54 y.o.   male referred by Colon Branch, MD for evaluation of iron deficiency anemia. Bayne states he was recently evaluated for routine physical and noted to have an iron deficiency anemia. He states he has been told that he had low iron all his life and has intermittently been on iron. He has never had a colonoscopy or upper endoscopy as part of workup for his iron deficiency anemia. His past medical history significant for hypertension, tobacco abuse, myocardial infarction in March 2010 with angioplasty of the circumflex and emergent CABG 11/07/2008. He had a bare metal stent placed. He has a history of cerebrovascular disease and had a stroke in July 2016 and several strokes in 2008. He has an abnormality of his gait. He has a history of cocaine abuse in the past and a history of congestive heart failure. He has a history of pancreatitis and reports to drink 2 beers a day. He occasionally uses marijuana recreationally. As a history of anxiety and depression and has been on citalopram but states he is not sure it makes a difference.  Daniel reports that he has had intermittent epigastric pain and heartburn. He frequently gets heartburn for which he uses Rolaids. More recently he was prescribed pantoprazole but has been taking it at bedtime on the days he remembers to take it. He has waves of nausea that tend to be worse on an empty stomach and temporary alleviated with ingestion of food. He has frequent nocturnal regurgitation and belches a lot. He denies complaints of dysphagia or odynophagia. He denies early satiety. His bowel movements have remained fairly regular. He had been started on iron by his primary care provider but discontinued it after a week or 2 because it caused constipation. He is unaware of a family history of celiac disease. He states his father was diagnosed with colon cancer  in his early 31s but is alive at 78. He is unaware of any other family members with colon cancer, colon polyps, or inflammatory bowel disease.   Past Medical History  Diagnosis Date  . Hypertension   . History of tobacco abuse     still smokes  . Myocardial infarction [facility]) 12/07/2000    STEMI March 2010, angioplasty of Circumflex and emergent CABG November 07, 2008, bare metal stent to SVG to Circumflex with bare metal stent  November 2010.   [name] Chest pain   . Coronary artery disease   . Cerebrovascular disease ~2008 and 02-2015  . Abnormality of gait   . Cocaine use     denies current use   . CHF (congestive heart failure) ([place])   . Pancreatitis     Past Surgical History  Procedure Laterality Date  . Cardiac catheterization  11/07/2008    EF of 50-55% -- normal LV systolic function, acute inferolateral ST elevation MI,  PTCA of the circumflex artery -- [name]. [name], [name]  . Coronary artery bypass graft    . Left heart catheterization with coronary/graft angiogram N/A 05/15/2013    Procedure: LEFT HEART CATHETERIZATION WITH Beatrix Fetters;  Surgeon: [name], MD;  Location: [facility] CATH LAB;  Service: Cardiovascular;  Laterality: N/A;   Family History  Problem Relation Age of Onset  . Heart disease Mother   . Heart failure Mother   . Diabetes Mother   . Prostate cancer Neg Hx   .  Colon cancer Father     late 38s   Social History  Substance Use Topics  . Smoking status: Current Every Day Smoker -- 1.00 packs/day for 30 years    Types: Cigarettes  . Smokeless tobacco: Never Used     Comment: 1 PPD  . Alcohol Use: 0.0 oz/week    0 Standard drinks or equivalent per week     Comment: 2 beers a day   Current Outpatient Prescriptions  Medication Sig Dispense Refill  . acetaminophen (TYLENOL) 500 MG tablet Take 500-1,000 mg by mouth every 8 (eight) hours as needed.    [name] aspirin 325 MG tablet Take 1 tablet (325 mg total) by mouth daily.    [name] b complex vitamins  tablet Take 1 tablet by mouth daily.    . citalopram (CELEXA) 20 MG tablet Take 1 tablet (20 mg total) by mouth daily. (Patient taking differently: Take 20 mg by mouth at bedtime. ) 30 tablet 3  . ferrous sulfate 325 (65 FE) MG tablet Take 325 mg by mouth daily with breakfast.    . HYDROcodone-acetaminophen (NORCO/VICODIN) 5-325 MG tablet Take 1-2 tablets by mouth 3 (three) times daily as needed for moderate pain or severe pain. 30 tablet 0  . metoprolol tartrate (LOPRESSOR) 25 MG tablet Take 0.5 tablets (12.5 mg total) by mouth 2 (two) times daily. 60 tablet 11  . nitroGLYCERIN (NITROSTAT) 0.4 MG SL tablet Place 1 tablet (0.4 mg total) under the tongue every 5 (five) minutes as needed for chest pain. 25 tablet 6  . pantoprazole (PROTONIX) 40 MG tablet Take 1 tablet (40 mg total) by mouth daily. 30 tablet 3  . simvastatin (ZOCOR) 20 MG tablet Take 1 tablet (20 mg total) by mouth at bedtime. 30 tablet 11  . Na Sulfate-K Sulfate-Mg Sulf SOLN Take 1 kit by mouth once. 354 mL 0   No current facility-administered medications for this visit.   No Known Allergies   Review of Systems: Gen: Denies any fever, chills, sweats, anorexia, fatigue, weakness, malaise, weight loss, and sleep disorder CV: Denies chest pain, angina, palpitations, syncope, orthopnea, PND, peripheral edema, and claudication. Resp: Denies dyspnea at rest, dyspnea with exercise, cough, sputum, wheezing, coughing up blood, and pleurisy. GI: Denies vomiting blood, jaundice, and fecal incontinence.   Denies dysphagia or odynophagia. Admits to frequent heartburn. GU : Denies urinary burning, blood in urine, urinary frequency, urinary hesitancy, nocturnal urination, and urinary incontinence. MS: Denies joint pain, limitation of movement, and swelling, stiffness, low back pain, extremity pain. Denies muscle weakness, cramps, atrophy.  Derm: Denies rash, itching, dry skin, hives, moles, warts, or unhealing ulcers.  Psych: Denies  depression, anxiety, memory loss, suicidal ideation, hallucinations, paranoia, and confusion. Heme: Denies bruising, bleeding, and enlarged lymph nodes. Neuro:  Denies any headaches, dizziness, paresthesias. Endo:  Denies any problems with DM, thyroid, adrenal function  Studies: Ct Abdomen Pelvis W Contrast  06/16/2015  CLINICAL DATA:  Mid upper abdominal pain for 2 days. EXAM: CT ABDOMEN AND PELVIS WITH CONTRAST TECHNIQUE: Multidetector CT imaging of the abdomen and pelvis was performed using the standard protocol following bolus administration of intravenous contrast. CONTRAST:  174m OMNIPAQUE IOHEXOL 300 MG/ML  SOLN COMPARISON:  None currently available FINDINGS: Lower chest and abdominal wall: There is a moderate sliding-type hiatal hernia without obstruction or inflammatory change. Mildly enlarged lymph node right of the gastric cardia is nonspecific in isolation. Small fatty umbilical and bilateral inguinal hernias. Extensive atherosclerotic calcification seen in the visible right coronary circulation Hepatobiliary:  No focal liver abnormality.No evidence of biliary obstruction or stone. Pancreas: Mild stranding around the head and uncinate process of the pancreas without fluid collection. Few coarse calcifications around the pancreatic head which appear atherosclerotic on reformats. No duct enlargement. Spleen: Unremarkable. Adrenals/Urinary Tract: Negative adrenals. No hydronephrosis or definite stone (hilar calcifications appear atherosclerotic). Unremarkable bladder. Reproductive:No pathologic findings. Stomach/Bowel:  No obstruction. No appendicitis. Vascular/Lymphatic: Extensive atherosclerotic calcification. No acute vascular finding. No mass or adenopathy. Peritoneal: No ascites or pneumoperitoneum. Musculoskeletal: No acute abnormalities. IMPRESSION: 1. Mild pancreatitis. 2. Moderate sliding hiatal hernia. 3. Age advanced atherosclerosis. Electronically Signed   By: Monte Fantasia [name]   On:  06/16/2015 21:53    LAB RESULTS: Blood work 06/16/2015 revealed a CBC with WBC 12.9, hemoglobin 11.6, hematocrit 38.2, platelets 273,000. MCV 80.4. Iron studies 07/04/2015 iron less than 10, ferritin 9   Physical Exam: BP 110/64 mmHg  Pulse 62  Ht _0  (1.803 m)  Wt 210 lb (95.255 kg)  BMI 29.30 kg/m2 Constitutional: Pleasant,well-developed, male in no acute distress. HEENT: Normocephalic and atraumatic. Conjunctivae are normal. No scleral icterus. Neck supple. No JVD Cardiovascular: Normal rate, regular rhythm.  Pulmonary/chest: Effort normal and breath sounds normal. No wheezing, rales or rhonchi. Abdominal: Soft, nondistended, nontender. Bowel sounds active throughout. There are no masses palpable. No hepatomegaly. Rectal: Brown stool, Hemoccult-positive Extremities: no edema Lymphadenopathy: No cervical adenopathy noted. Neurological: Alert and oriented to person place and time. Skin: Skin is warm and dry. No rashes noted. Psychiatric: Normal mood and affect. Behavior is normal.  ASSESSMENT AND PLAN: 54 year old male with a history of iron deficiency anemia referred for evaluation. He has been experiencing increasing heartburn over the past several months. He also has a family history of colon cancer. Stools today are Hemoccult-positive. He has been advised to undergo colonoscopy to screen for polyps or neoplasia as well as an upper endoscopy to evaluate for gastritis, ulcer, esophagitis, duodenitis etc.The risks, benefits, and alternatives to colonoscopy with possible biopsy and possible polypectomy were discussed with the patient and they consent to proceed.  The risks, benefits, and alternatives to endoscopy with possible biopsy and possible dilation were discussed with the patient and they consent to proceed.  The procedures will be scheduled with Dr. Hilarie Fredrickson. A repeat CBC, comprehensive metabolic panel, IBC panel, ferritin, IgA, and TTG will be obtained. If his IgA is low or his  TTG is normal, small bowel biopsies to further evaluate for sprue will be obtained at the time of his EGD. Further recommendations will be made pending the findings of the above.  Taelyr Jantz, Deloris Ping 07/16/2015, 8:55 PM  CC: Colon Branch, MD  Addendum: Reviewed and agree with initial management. Jerene Bears, MD

## 2015-07-16 NOTE — Patient Instructions (Addendum)
Pt was unable to complete NP paperwork due to history of stroke.  You have been scheduled for a colonoscopy. Please follow written instructions given to you at your visit today.  Please pick up your prep supplies at the pharmacy within the next 1-3 days. If you use inhalers (even only as needed), please bring them with you on the day of your procedure. Your physician has requested that you go to www.startemmi.com and enter the access code given to you at your visit today. This web site gives a general overview about your procedure. However, you should still follow specific instructions given to you by our office regarding your preparation for the procedure.  We have sent the following medications to your pharmacy for you to pick up at your convenience: Suprep  Continue Pantoprazole, Take in the morning.  We have given you information to read and review about GERD.  Gastroesophageal Reflux Disease, Adult Normally, food travels down the esophagus and stays in the stomach to be digested. However, when a person has gastroesophageal reflux disease (GERD), food and stomach acid move back up into the esophagus. When this happens, the esophagus becomes sore and inflamed. Over time, GERD can create small holes (ulcers) in the lining of the esophagus.  CAUSES This condition is caused by a problem with the muscle between the esophagus and the stomach (lower esophageal sphincter, or LES). Normally, the LES muscle closes after food passes through the esophagus to the stomach. When the LES is weakened or abnormal, it does not close properly, and that allows food and stomach acid to go back up into the esophagus. The LES can be weakened by certain dietary substances, medicines, and medical conditions, including:  Tobacco use.  Pregnancy.  Having a hiatal hernia.  Heavy alcohol use.  Certain foods and beverages, such as coffee, chocolate, onions, and peppermint. RISK FACTORS This condition is more likely  to develop in:  People who have an increased body weight.  People who have connective tissue disorders.  People who use NSAID medicines. SYMPTOMS Symptoms of this condition include:  Heartburn.  Difficult or painful swallowing.  The feeling of having a lump in the throat.  Abitter taste in the mouth.  Bad breath.  Having a large amount of saliva.  Having an upset or bloated stomach.  Belching.  Chest pain.  Shortness of breath or wheezing.  Ongoing (chronic) cough or a night-time cough.  Wearing away of tooth enamel.  Weight loss. Different conditions can cause chest pain. Make sure to see your health care provider if you experience chest pain. DIAGNOSIS Your health care provider will take a medical history and perform a physical exam. To determine if you have mild or severe GERD, your health care provider may also monitor how you respond to treatment. You may also have other tests, including:  An endoscopy toexamine your stomach and esophagus with a small camera.  A test thatmeasures the acidity level in your esophagus.  A test thatmeasures how much pressure is on your esophagus.  A barium swallow or modified barium swallow to show the shape, size, and functioning of your esophagus. TREATMENT The goal of treatment is to help relieve your symptoms and to prevent complications. Treatment for this condition may vary depending on how severe your symptoms are. Your health care provider may recommend:  Changes to your diet.  Medicine.  Surgery. HOME CARE INSTRUCTIONS Diet  Follow a diet as recommended by your health care provider. This may involve avoiding foods and  drinks such as:  Coffee and tea (with or without caffeine).  Drinks that containalcohol.  Energy drinks and sports drinks.  Carbonated drinks or sodas.  Chocolate and cocoa.  Peppermint and mint flavorings.  Garlic and onions.  Horseradish.  Spicy and acidic foods, including  peppers, chili powder, curry powder, vinegar, hot sauces, and barbecue sauce.  Citrus fruit juices and citrus fruits, such as oranges, lemons, and limes.  Tomato-based foods, such as red sauce, chili, salsa, and pizza with red sauce.  Fried and fatty foods, such as donuts, french fries, potato chips, and high-fat dressings.  High-fat meats, such as hot dogs and fatty cuts of red and white meats, such as rib eye steak, sausage, ham, and bacon.  High-fat dairy items, such as whole milk, butter, and cream cheese.  Eat small, frequent meals instead of large meals.  Avoid drinking large amounts of liquid with your meals.  Avoid eating meals during the 2-3 hours before bedtime.  Avoid lying down right after you eat.  Do not exercise right after you eat. General Instructions  Pay attention to any changes in your symptoms.  Take over-the-counter and prescription medicines only as told by your health care provider. Do not take aspirin, ibuprofen, or other NSAIDs unless your health care provider told you to do so.  Do not use any tobacco products, including cigarettes, chewing tobacco, and e-cigarettes. If you need help quitting, ask your health care provider.  Wear loose-fitting clothing. Do not wear anything tight around your waist that causes pressure on your abdomen.  Raise (elevate) the head of your bed 6 inches (15cm).  Try to reduce your stress, such as with yoga or meditation. If you need help reducing stress, ask your health care provider.  If you are overweight, reduce your weight to an amount that is healthy for you. Ask your health care provider for guidance about a safe weight loss goal.  Keep all follow-up visits as told by your health care provider. This is important. SEEK MEDICAL CARE IF:  You have new symptoms.  You have unexplained weight loss.  You have difficulty swallowing, or it hurts to swallow.  You have wheezing or a persistent cough.  Your symptoms do  not improve with treatment.  You have a hoarse voice. SEEK IMMEDIATE MEDICAL CARE IF:  You have pain in your arms, neck, jaw, teeth, or back.  You feel sweaty, dizzy, or light-headed.  You have chest pain or shortness of breath.  You vomit and your vomit looks like blood or coffee grounds.  You faint.  Your stool is bloody or black.  You cannot swallow, drink, or eat.   This information is not intended to replace advice given to you by your health care provider. Make sure you discuss any questions you have with your health care provider.   Document Released: 05/06/2005 Document Revised: 04/17/2015 Document Reviewed: 11/21/2014 Elsevier Interactive Patient Education 2016 [place] for Gastroesophageal Reflux Disease, Adult When you have gastroesophageal reflux disease (GERD), the foods you eat and your eating habits are very important. Choosing the right foods can help ease the discomfort of GERD. WHAT GENERAL GUIDELINES DO I NEED TO FOLLOW?  Choose fruits, vegetables, whole grains, low-fat dairy products, and low-fat meat, fish, and poultry.  Limit fats such as oils, salad dressings, butter, nuts, and avocado.  Keep a food diary to identify foods that cause symptoms.  Avoid foods that cause reflux. These may be different for different people.  Eat frequent small meals instead of three large meals each day.  Eat your meals slowly, in a relaxed setting.  Limit fried foods.  Cook foods using methods other than frying.  Avoid drinking alcohol.  Avoid drinking large amounts of liquids with your meals.  Avoid bending over or lying down until 2-3 hours after eating. WHAT FOODS ARE NOT RECOMMENDED? The following are some foods and drinks that may worsen your symptoms: Vegetables Tomatoes. Tomato juice. Tomato and spaghetti sauce. Chili peppers. Onion and garlic. Horseradish. Fruits Oranges, grapefruit, and lemon (fruit and juice). Meats High-fat  meats, fish, and poultry. This includes hot dogs, ribs, ham, sausage, salami, and bacon. Dairy Whole milk and chocolate milk. Sour cream. Cream. Butter. Ice cream. Cream cheese.  Beverages Coffee and tea, with or without caffeine. Carbonated beverages or energy drinks. Condiments Hot sauce. Barbecue sauce.  Sweets/Desserts Chocolate and cocoa. Donuts. Peppermint and spearmint. Fats and Oils High-fat foods, including [place] fries and potato chips. Other Vinegar. Strong spices, such as black pepper, white pepper, red pepper, cayenne, curry powder, cloves, ginger, and chili powder. The items listed above may not be a complete list of foods and beverages to avoid. Contact your dietitian for more information.   This information is not intended to replace advice given to you by your health care provider. Make sure you discuss any questions you have with your health care provider.   Document Released: 07/27/2005 Document Revised: 08/17/2014 Document Reviewed: 05/31/2013 Elsevier Interactive Patient Education [employer].

## 2015-07-29 ENCOUNTER — Telehealth: Payer: Self-pay | Admitting: Internal Medicine

## 2015-07-29 ENCOUNTER — Encounter: Payer: Medicaid Other | Admitting: Internal Medicine

## 2015-07-29 NOTE — Telephone Encounter (Signed)
error:[id number] ° °

## 2015-07-30 ENCOUNTER — Encounter: Payer: Self-pay | Admitting: Internal Medicine

## 2015-07-30 ENCOUNTER — Ambulatory Visit (INDEPENDENT_AMBULATORY_CARE_PROVIDER_SITE_OTHER): Payer: Medicaid Other | Admitting: Internal Medicine

## 2015-07-30 VITALS — BP 128/74 | HR 79 | Temp 98.4°F | Ht 71.0 in | Wt 213.1 lb

## 2015-07-30 DIAGNOSIS — K859 Acute pancreatitis without necrosis or infection, unspecified: Secondary | ICD-10-CM | POA: Diagnosis not present

## 2015-07-30 DIAGNOSIS — K858 Other acute pancreatitis without necrosis or infection: Secondary | ICD-10-CM

## 2015-07-30 DIAGNOSIS — Z09 Encounter for follow-up examination after completed treatment for conditions other than malignant neoplasm: Secondary | ICD-10-CM

## 2015-07-30 LAB — AMYLASE: AMYLASE: 37 U/L (ref 27–131)

## 2015-07-30 LAB — LIPASE: LIPASE: 83 U/L — AB (ref 11.0–59.0)

## 2015-07-30 LAB — ALT: ALT: 9 U/L (ref 0–53)

## 2015-07-30 LAB — AST: AST: 13 U/L (ref 0–37)

## 2015-07-30 MED ORDER — PANTOPRAZOLE SODIUM 40 MG PO TBEC: 40.0000 mg | DELAYED_RELEASE_TABLET | Freq: Two times a day (BID) | ORAL | Status: DC

## 2015-07-30 MED ORDER — HYDROCODONE-ACETAMINOPHEN 7.5-325 MG PO TABS: 1.0000 | ORAL_TABLET | Freq: Three times a day (TID) | ORAL | Status: DC | PRN

## 2015-07-30 NOTE — Patient Instructions (Addendum)
Before you leave the office:  Get your blood work at the lab  Schedule a routine office visit or check up to be done in  2 months You don't need to be fasting Front desk:    30   Pain: If the pain is moderate: Take Tylenol 500 mg 2 tablets every 8 hours If the pain is severe, take hydrocodone/acetominophen 1 tablet   every 8 hours INSTEAD of tylenol Hydrocodone has Tylenol on it. Do not take more than 3000 mg of Tylenol (acetominophen)  a day.  Call or go to the ER if severe symptoms.

## 2015-07-30 NOTE — Assessment & Plan Note (Signed)
Pancreatitis: pt w/ h/o pancreatitis presents with persistent abdominal pain. he reports no EtOH in 3 weeks. Abdominal exam is inconclusive (epigastrium nontender if he is distracted). rec to continue abstaining from alcohol, check labs. States pain medication is not working: Change hydrocodone from 5 mg to 7.5 mg, see instructions. Increase Protonix to twice a day Follow-up with GI for endoscopy as recommended RTC 2 months

## 2015-07-30 NOTE — Progress Notes (Signed)
Pre visit review using our clinic review tool, if applicable. No additional management support is needed unless otherwise documented below in the visit note. 

## 2015-07-30 NOTE — Progress Notes (Signed)
Subjective:    Patient ID: [name]., male    DOB: 01-30-1961, 54 y.o.   MRN: [id number]  DOS:  07/30/2015 Type of visit - description : Acute visit Interval history:  Since the last time he was here, abdominal pain decrease but it has resurface in the last 3 weeks. Located in the epigastrium, mild but steady, occasionally gets worse but not necessarily related with food. No clear triggers. Reports he has not drank alcohol in the last few weeks  Review of Systems No fever or chills No substernal chest pain Some nausea with abdominal pain but no vomiting or blood in the stools. No dysphagia or odynophagia. Not taking Motrin.   Past Medical History  Diagnosis Date  . Hypertension   . History of tobacco abuse     still smokes  . Myocardial infarction [facility]) 12/07/2000    STEMI March 2010, angioplasty of Circumflex and emergent CABG November 07, 2008, bare metal stent to SVG to Circumflex with bare metal stent  November 2010.   [name] Chest pain   . Coronary artery disease   . Cerebrovascular disease ~2008 and 02-2015  . Abnormality of gait   . Cocaine use     denies current use   . CHF (congestive heart failure) ([place])   . Pancreatitis     Past Surgical History  Procedure Laterality Date  . Cardiac catheterization  11/07/2008    EF of 50-55% -- normal LV systolic function, acute inferolateral ST elevation MI,  PTCA of the circumflex artery -- [name]. [name], [name]  . Coronary artery bypass graft    . Left heart catheterization with coronary/graft angiogram N/A 05/15/2013    Procedure: LEFT HEART CATHETERIZATION WITH Beatrix Fetters;  Surgeon: [name], MD;  Location: [facility] CATH LAB;  Service: Cardiovascular;  Laterality: N/A;    Social History   Social History  . Marital Status: Single    Spouse Name: N/A  . Number of Children: 2  . Years of Education: N/A   Occupational History  . not working , used to be a Chief Executive Officer     Social History  Main Topics  . Smoking status: Current Every Day Smoker -- 1.00 packs/day for 30 years    Types: Cigarettes  . Smokeless tobacco: Never Used     Comment: 1 PPD  . Alcohol Use: 0.0 oz/week    0 Standard drinks or equivalent per week     Comment: 2 beers a day  . Drug Use: No     Comment: former users  . Sexual Activity: Not Currently   Other Topics Concern  . Not on file   Social History Narrative   Lives w/ girlfriend         Medication List       This list is accurate as of: 07/30/15  5:54 PM.  Always use your most recent med list.               acetaminophen 500 MG tablet  Commonly known as:  TYLENOL  Take 500-1,000 mg by mouth every 8 (eight) hours as needed.     aspirin 325 MG tablet  Take 1 tablet (325 mg total) by mouth daily.     b complex vitamins tablet  Take 1 tablet by mouth daily.     citalopram 20 MG tablet  Commonly known as:  CELEXA  Take 1 tablet (20 mg total) by mouth daily.     ferrous sulfate 325 (  65 FE) MG tablet  Take 325 mg by mouth daily with breakfast. Reported on 07/30/2015     HYDROcodone-acetaminophen 7.5-325 MG tablet  Commonly known as:  NORCO  Take 1 tablet by mouth 3 (three) times daily as needed for moderate pain.     metoprolol tartrate 25 MG tablet  Commonly known as:  LOPRESSOR  Take 0.5 tablets (12.5 mg total) by mouth 2 (two) times daily.     Na Sulfate-K Sulfate-Mg Sulf Soln  Take 1 kit by mouth once.     nitroGLYCERIN 0.4 MG SL tablet  Commonly known as:  NITROSTAT  Place 1 tablet (0.4 mg total) under the tongue every 5 (five) minutes as needed for chest pain.     pantoprazole 40 MG tablet  Commonly known as:  PROTONIX  Take 1 tablet (40 mg total) by mouth 2 (two) times daily before a meal.     simvastatin 20 MG tablet  Commonly known as:  ZOCOR  Take 1 tablet (20 mg total) by mouth at bedtime.           Objective:   Physical Exam BP 128/74 mmHg  Pulse 79  Temp(Src) 98.4 F (36.9 C) (Oral)  Ht 5'  11" (1.803 m)  Wt 213 lb 2 oz (96.673 kg)  BMI 29.74 kg/m2  SpO2 96% General:   Well developed, well nourished . NAD.  HEENT:  Normocephalic . Face symmetric, atraumatic Lungs:  CTA B Normal respiratory effort, no intercostal retractions, no accessory muscle use. Heart: RRR,  no murmur.  no pretibial edema bilaterally  Abdomen:  Not distended, soft. No rebound or rigidity. No mass,organomegaly. Epigastrium is a slightly tender only when he is not distracted.  Skin: Not pale. Not jaundice Neurologic:  alert & oriented X3.  Speech normal, gait appropriate for age and unassisted Psych--  Cognition and judgment appear intact.  Cooperative with normal attention span and concentration.  Behavior appropriate. No anxious or depressed appearing.    Assessment & Plan:   Assessment >  HTN CV: --CAD: 2010 angioplasty f/u by  CABG 2010, cath 2014 --Stroke 2008, 02-2015 Anxiety depression Snoring, suspect OSA, saw  Pulmonary 2014, no sleep study Abnormal gait Iron def anemia dx 03-2015, saw GI ,  Endoscopies scheduled 09-2015 Pancreatitis 06/2015.(EtOH 2 days prior to onset of symptoms) H/o etoh abuse H/o cocaine  Smoker  +FH: father prostate ca , dx late 81s  PLAN Pancreatitis: pt w/ h/o pancreatitis presents with persistent abdominal pain. he reports no EtOH in 3 weeks. Abdominal exam is inconclusive (epigastrium nontender if he is distracted). rec to continue abstaining from alcohol, check labs. States pain medication is not working: Change hydrocodone from 5 mg to 7.5 mg, see instructions. Increase Protonix to twice a day Follow-up with GI for endoscopy as recommended RTC 2 months

## 2015-07-31 ENCOUNTER — Encounter: Payer: Self-pay | Admitting: Internal Medicine

## 2015-07-31 ENCOUNTER — Ambulatory Visit: Payer: Self-pay | Admitting: Internal Medicine

## 2015-08-06 ENCOUNTER — Telehealth: Payer: Self-pay | Admitting: Internal Medicine

## 2015-08-06 ENCOUNTER — Inpatient Hospital Stay (HOSPITAL_COMMUNITY)
Admission: EM | Admit: 2015-08-06 | Discharge: 2015-08-09 | DRG: 440 | Disposition: A | Discharge status: Home / Self Care | Payer: Medicaid Other | Attending: Internal Medicine | Admitting: Internal Medicine

## 2015-08-06 ENCOUNTER — Emergency Department (HOSPITAL_COMMUNITY): Payer: Medicaid Other

## 2015-08-06 ENCOUNTER — Encounter (HOSPITAL_COMMUNITY): Payer: Self-pay | Admitting: Vascular Surgery

## 2015-08-06 DIAGNOSIS — Z955 Presence of coronary angioplasty implant and graft: Secondary | ICD-10-CM

## 2015-08-06 DIAGNOSIS — Z79899 Other long term (current) drug therapy: Secondary | ICD-10-CM

## 2015-08-06 DIAGNOSIS — F1411 Cocaine abuse, in remission: Secondary | ICD-10-CM | POA: Diagnosis present

## 2015-08-06 DIAGNOSIS — K859 Acute pancreatitis without necrosis or infection, unspecified: Principal | ICD-10-CM | POA: Insufficient documentation

## 2015-08-06 DIAGNOSIS — I252 Old myocardial infarction: Secondary | ICD-10-CM

## 2015-08-06 DIAGNOSIS — E785 Hyperlipidemia, unspecified: Secondary | ICD-10-CM | POA: Diagnosis present

## 2015-08-06 DIAGNOSIS — Z951 Presence of aortocoronary bypass graft: Secondary | ICD-10-CM

## 2015-08-06 DIAGNOSIS — D509 Iron deficiency anemia, unspecified: Secondary | ICD-10-CM | POA: Diagnosis present

## 2015-08-06 DIAGNOSIS — Z800 Family history of malignant neoplasm of digestive organs: Secondary | ICD-10-CM

## 2015-08-06 DIAGNOSIS — I509 Heart failure, unspecified: Secondary | ICD-10-CM | POA: Diagnosis present

## 2015-08-06 DIAGNOSIS — F1010 Alcohol abuse, uncomplicated: Secondary | ICD-10-CM | POA: Diagnosis present

## 2015-08-06 DIAGNOSIS — K861 Other chronic pancreatitis: Secondary | ICD-10-CM | POA: Diagnosis present

## 2015-08-06 DIAGNOSIS — I2510 Atherosclerotic heart disease of native coronary artery without angina pectoris: Secondary | ICD-10-CM | POA: Diagnosis present

## 2015-08-06 DIAGNOSIS — K429 Umbilical hernia without obstruction or gangrene: Secondary | ICD-10-CM | POA: Diagnosis present

## 2015-08-06 DIAGNOSIS — I110 Hypertensive heart disease with heart failure: Secondary | ICD-10-CM | POA: Diagnosis present

## 2015-08-06 DIAGNOSIS — Z8673 Personal history of transient ischemic attack (TIA), and cerebral infarction without residual deficits: Secondary | ICD-10-CM

## 2015-08-06 DIAGNOSIS — F419 Anxiety disorder, unspecified: Secondary | ICD-10-CM | POA: Diagnosis present

## 2015-08-06 DIAGNOSIS — K5900 Constipation, unspecified: Secondary | ICD-10-CM | POA: Diagnosis present

## 2015-08-06 DIAGNOSIS — Z7982 Long term (current) use of aspirin: Secondary | ICD-10-CM

## 2015-08-06 DIAGNOSIS — I10 Essential (primary) hypertension: Secondary | ICD-10-CM | POA: Diagnosis present

## 2015-08-06 DIAGNOSIS — F17200 Nicotine dependence, unspecified, uncomplicated: Secondary | ICD-10-CM | POA: Diagnosis present

## 2015-08-06 DIAGNOSIS — K219 Gastro-esophageal reflux disease without esophagitis: Secondary | ICD-10-CM | POA: Diagnosis present

## 2015-08-06 DIAGNOSIS — K760 Fatty (change of) liver, not elsewhere classified: Secondary | ICD-10-CM | POA: Diagnosis present

## 2015-08-06 DIAGNOSIS — F329 Major depressive disorder, single episode, unspecified: Secondary | ICD-10-CM | POA: Diagnosis present

## 2015-08-06 DIAGNOSIS — F17210 Nicotine dependence, cigarettes, uncomplicated: Secondary | ICD-10-CM | POA: Diagnosis present

## 2015-08-06 DIAGNOSIS — I639 Cerebral infarction, unspecified: Secondary | ICD-10-CM | POA: Diagnosis present

## 2015-08-06 DIAGNOSIS — K858 Other acute pancreatitis without necrosis or infection: Secondary | ICD-10-CM

## 2015-08-06 HISTORY — DX: Cerebral infarction, unspecified: I63.9

## 2015-08-06 HISTORY — DX: Emphysema (subcutaneous) resulting from a procedure, initial encounter: T81.82XA

## 2015-08-06 HISTORY — DX: Iron deficiency anemia, unspecified: D50.9

## 2015-08-06 LAB — COMPREHENSIVE METABOLIC PANEL
ALK PHOS: 69 U/L (ref 38–126)
ALT: 10 U/L — AB (ref 17–63)
AST: 14 U/L — AB (ref 15–41)
Albumin: 3.1 g/dL — ABNORMAL LOW (ref 3.5–5.0)
Anion gap: 12 (ref 5–15)
BUN: 13 mg/dL (ref 6–20)
CALCIUM: 8.7 mg/dL — AB (ref 8.9–10.3)
CHLORIDE: 102 mmol/L (ref 101–111)
CO2: 24 mmol/L (ref 22–32)
CREATININE: 0.93 mg/dL (ref 0.61–1.24)
Glucose, Bld: 109 mg/dL — ABNORMAL HIGH (ref 65–99)
Potassium: 3.9 mmol/L (ref 3.5–5.1)
Sodium: 138 mmol/L (ref 135–145)
Total Bilirubin: 0.5 mg/dL (ref 0.3–1.2)
Total Protein: 7.6 g/dL (ref 6.5–8.1)

## 2015-08-06 LAB — URINALYSIS, ROUTINE W REFLEX MICROSCOPIC
Glucose, UA: NEGATIVE mg/dL
HGB URINE DIPSTICK: NEGATIVE
KETONES UR: 15 mg/dL — AB
NITRITE: NEGATIVE
PH: 5 (ref 5.0–8.0)
Protein, ur: NEGATIVE mg/dL
SPECIFIC GRAVITY, URINE: 1.034 — AB (ref 1.005–1.030)

## 2015-08-06 LAB — URINE MICROSCOPIC-ADD ON

## 2015-08-06 LAB — CBC WITH DIFFERENTIAL/PLATELET
BASOS ABS: 0 10*3/uL (ref 0.0–0.1)
BASOS PCT: 0 %
Eosinophils Absolute: 0.3 10*3/uL (ref 0.0–0.7)
Eosinophils Relative: 2 %
HEMATOCRIT: 33.9 % — AB (ref 39.0–52.0)
HEMOGLOBIN: 10.4 g/dL — AB (ref 13.0–17.0)
LYMPHS ABS: 1.1 10*3/uL (ref 0.7–4.0)
Lymphocytes Relative: 9 %
MCH: 23.7 pg — ABNORMAL LOW (ref 26.0–34.0)
MCHC: 30.7 g/dL (ref 30.0–36.0)
MCV: 77.4 fL — ABNORMAL LOW (ref 78.0–100.0)
MONOS PCT: 11 %
Monocytes Absolute: 1.3 10*3/uL — ABNORMAL HIGH (ref 0.1–1.0)
NEUTROS PCT: 78 %
Neutro Abs: 9.2 10*3/uL — ABNORMAL HIGH (ref 1.7–7.7)
Platelets: 411 10*3/uL — ABNORMAL HIGH (ref 150–400)
RBC: 4.38 MIL/uL (ref 4.22–5.81)
RDW: 20.6 % — ABNORMAL HIGH (ref 11.5–15.5)
WBC: 11.9 10*3/uL — ABNORMAL HIGH (ref 4.0–10.5)

## 2015-08-06 LAB — TROPONIN I: Troponin I: 0.03 ng/mL (ref <0.031)

## 2015-08-06 LAB — LIPASE, BLOOD: Lipase: 70 U/L — ABNORMAL HIGH (ref 11–51)

## 2015-08-06 MED ORDER — HYDROMORPHONE HCL 1 MG/ML IJ SOLN
1.0000 mg | Freq: Once | INTRAMUSCULAR | Status: AC
  Administered 2015-08-06: 1 mg via INTRAVENOUS
  Filled 2015-08-06: qty 1

## 2015-08-06 MED ORDER — IOHEXOL 300 MG/ML  SOLN
100.0000 mL | Freq: Once | INTRAMUSCULAR | Status: AC | PRN
  Administered 2015-08-06: 100 mL via INTRAVENOUS

## 2015-08-06 MED ORDER — PIPERACILLIN-TAZOBACTAM 3.375 G IVPB 30 MIN
3.3750 g | Freq: Once | INTRAVENOUS | Status: AC
  Administered 2015-08-07: 3.375 g via INTRAVENOUS
  Filled 2015-08-06: qty 50

## 2015-08-06 MED ORDER — SODIUM CHLORIDE 0.9 % IV BOLUS (SEPSIS)
1000.0000 mL | Freq: Once | INTRAVENOUS | Status: AC
  Administered 2015-08-06: 1000 mL via INTRAVENOUS

## 2015-08-06 MED ORDER — MORPHINE SULFATE (PF) 4 MG/ML IV SOLN
4.0000 mg | Freq: Once | INTRAVENOUS | Status: AC
  Administered 2015-08-06: 4 mg via INTRAVENOUS
  Filled 2015-08-06: qty 1

## 2015-08-06 MED ORDER — ONDANSETRON HCL 4 MG/2ML IJ SOLN
4.0000 mg | Freq: Once | INTRAMUSCULAR | Status: AC
  Administered 2015-08-06: 4 mg via INTRAVENOUS
  Filled 2015-08-06: qty 2

## 2015-08-06 NOTE — Telephone Encounter (Signed)
FYI. Please advise. Last filled on 07/30/2015 #40 and 0RF.

## 2015-08-06 NOTE — ED Provider Notes (Signed)
CSN: [id number]     Arrival date & time 08/06/15  1947 History   First MD Initiated Contact with Patient 08/06/15 2113     Chief Complaint  Patient presents with  . Abdominal Pain   Elias Dennington. is a 54 y.o. male with a history of pancreatitis, hypertension, MI, coronary artery disease, who presents to the emergency department complaining of worsening epigastric and right upper quadrant abdominal pain for the past several days. He reports he's had pain for several weeks but has worsened in the past several days. He reports this feels like his previous episode of pancreatitis. He reports 10 out of 10 pain currently. Reports nausea but no vomiting. He reports his last bowel movement was yesterday and was normal. He reports drinking alcohol but none in the past week. The patient denies fevers, chills, chest pain, shortness of breath, palpitations, urinary symptoms, hematuria, hematemesis, hematochezia, or rashes.  (Consider location/radiation/quality/duration/timing/severity/associated sxs/prior Treatment) HPI  Past Medical History  Diagnosis Date  . Hypertension   . History of tobacco abuse     still smokes  . Myocardial infarction [facility]) 12/07/2000    STEMI March 2010, angioplasty of Circumflex and emergent CABG November 07, 2008, bare metal stent to SVG to Circumflex with bare metal stent  November 2010.   [name] Chest pain   . Coronary artery disease   . Cerebrovascular disease ~2008 and 02-2015  . Abnormality of gait   . Cocaine use     denies current use   . CHF (congestive heart failure) ([place])   . Pancreatitis    Past Surgical History  Procedure Laterality Date  . Cardiac catheterization  11/07/2008    EF of 50-55% -- normal LV systolic function, acute inferolateral ST elevation MI,  PTCA of the circumflex artery -- [name]. [name], [name]  . Coronary artery bypass graft    . Left heart catheterization with coronary/graft angiogram N/A 05/15/2013    Procedure: LEFT HEART  CATHETERIZATION WITH Beatrix Fetters;  Surgeon: [name], MD;  Location: [facility] CATH LAB;  Service: Cardiovascular;  Laterality: N/A;   Family History  Problem Relation Age of Onset  . Heart disease Mother   . Heart failure Mother   . Diabetes Mother   . Prostate cancer Neg Hx   . Colon cancer Father     late 39s   Social History  Substance Use Topics  . Smoking status: Current Every Day Smoker -- 1.00 packs/day for 30 years    Types: Cigarettes  . Smokeless tobacco: Never Used     Comment: 1 PPD  . Alcohol Use: 0.0 oz/week    0 Standard drinks or equivalent per week     Comment: 2 beers a day    Review of Systems  Constitutional: Negative for fever.  HENT: Negative for congestion and sore throat.   Respiratory: Negative for cough, shortness of breath and wheezing.   Cardiovascular: Negative for chest pain and palpitations.  Gastrointestinal: Positive for nausea and abdominal pain. Negative for vomiting, diarrhea and blood in stool.  Genitourinary: Negative for dysuria, urgency, frequency, hematuria and difficulty urinating.  Musculoskeletal: Negative for back pain and neck pain.  Skin: Negative for rash.  Neurological: Negative for light-headedness and headaches.      Allergies  Review of patient's allergies indicates no known allergies.  Home Medications   Prior to Admission medications   Medication Sig Start Date End Date Taking? Authorizing Provider  acetaminophen (TYLENOL) 500 MG tablet Take 500-1,000 mg  by mouth every 8 (eight) hours as needed for mild pain.    Yes Historical Provider, MD  aspirin 325 MG tablet Take 1 tablet (325 mg total) by mouth daily. 02/21/15  Yes Delfina Redwood, MD  b complex vitamins tablet Take 1 tablet by mouth daily.   Yes Historical Provider, MD  citalopram (CELEXA) 20 MG tablet Take 1 tablet (20 mg total) by mouth daily. Patient taking differently: Take 20 mg by mouth at bedtime.  05/31/15  Yes Colon Branch, MD   HYDROcodone-acetaminophen (NORCO) 7.5-325 MG tablet Take 1 tablet by mouth 3 (three) times daily as needed for moderate pain. 07/30/15  Yes Colon Branch, MD  metoprolol tartrate (LOPRESSOR) 25 MG tablet Take 0.5 tablets (12.5 mg total) by mouth 2 (two) times daily. 03/13/15  Yes Burnell Blanks, MD  nitroGLYCERIN (NITROSTAT) 0.4 MG SL tablet Place 1 tablet (0.4 mg total) under the tongue every 5 (five) minutes as needed for chest pain. 03/13/15  Yes Burnell Blanks, MD  simvastatin (ZOCOR) 20 MG tablet Take 1 tablet (20 mg total) by mouth at bedtime. 03/13/15  Yes Burnell Blanks, MD  ferrous sulfate 325 (65 FE) MG tablet Take 325 mg by mouth daily with breakfast. Reported on 08/06/2015    Historical Provider, MD  Na Sulfate-K Sulfate-Mg Sulf SOLN Take 1 kit by mouth once. Patient not taking: Reported on 07/30/2015 07/16/15   Lori P Hvozdovic, PA-C  pantoprazole (PROTONIX) 40 MG tablet Take 1 tablet (40 mg total) by mouth 2 (two) times daily before a meal. Patient not taking: Reported on 08/06/2015 07/30/15   Colon Branch, MD   BP 139/91 mmHg  Pulse 85  Temp(Src) 98.9 F (37.2 C) (Oral)  Resp 18  SpO2 99% Physical Exam  Constitutional: He appears well-developed and well-nourished. No distress.  HENT:  Head: Normocephalic and atraumatic.  Mouth/Throat: Oropharynx is clear and moist.  Eyes: Conjunctivae are normal. Pupils are equal, round, and reactive to light. Right eye exhibits no discharge. Left eye exhibits no discharge.  Neck: Neck supple.  Cardiovascular: Normal rate, regular rhythm, normal heart sounds and intact distal pulses.  Exam reveals no gallop and no friction rub.   No murmur heard. Pulmonary/Chest: Effort normal and breath sounds normal. No respiratory distress. He has no wheezes. He has no rales.  Abdominal: Soft. Bowel sounds are normal. He exhibits no distension and no mass. There is tenderness. There is guarding. There is no rebound.  Abdomen is soft. Bowel  sounds are present. Patient has moderate epigastric and right upper quadrant tenderness to palpation.  Musculoskeletal: He exhibits no edema or tenderness.  Lymphadenopathy:    He has no cervical adenopathy.  Neurological: He is alert. Coordination normal.  Skin: Skin is warm and dry. No rash noted. He is not diaphoretic. No erythema. No pallor.  Psychiatric: He has a normal mood and affect. His behavior is normal.  Nursing note and vitals reviewed.   ED Course  Procedures (including critical care time) Labs Review Labs Reviewed  LIPASE, BLOOD - Abnormal; Notable for the following:    Lipase 70 (*)    All other components within normal limits  COMPREHENSIVE METABOLIC PANEL - Abnormal; Notable for the following:    Glucose, Bld 109 (*)    Calcium 8.7 (*)    Albumin 3.1 (*)    AST 14 (*)    ALT 10 (*)    All other components within normal limits  URINALYSIS, ROUTINE W REFLEX MICROSCOPIC (  NOT AT [facility]) - Abnormal; Notable for the following:    Specific Gravity, Urine 1.034 (*)    Bilirubin Urine SMALL (*)    Ketones, ur 15 (*)    Leukocytes, UA SMALL (*)    All other components within normal limits  CBC WITH DIFFERENTIAL/PLATELET - Abnormal; Notable for the following:    WBC 11.9 (*)    Hemoglobin 10.4 (*)    HCT 33.9 (*)    MCV 77.4 (*)    MCH 23.7 (*)    RDW 20.6 (*)    Platelets 411 (*)    Neutro Abs 9.2 (*)    Monocytes Absolute 1.3 (*)    All other components within normal limits  URINE MICROSCOPIC-ADD ON - Abnormal; Notable for the following:    Squamous Epithelial / LPF 0-5 (*)    Bacteria, UA FEW (*)    Casts GRANULAR CAST (*)    All other components within normal limits  URINE CULTURE  TROPONIN I    Imaging Review No results found. I have personally reviewed and evaluated these images and lab results as part of my medical decision-making.   EKG Interpretation None      Filed Vitals:   08/06/15 1957  BP: 139/91  Pulse: 85  Temp: 98.9 F (37.2 C)   TempSrc: Oral  Resp: 18  SpO2: 99%     MDM   Meds given in ED:  Medications  piperacillin-tazobactam (ZOSYN) IVPB 3.375 g (3.375 g Intravenous New Bag/Given 08/07/15 0034)  sodium chloride 0.9 % bolus 1,000 mL (0 mLs Intravenous Stopped 08/06/15 2245)  ondansetron (ZOFRAN) injection 4 mg (4 mg Intravenous Given 08/06/15 2145)  morphine 4 MG/ML injection 4 mg (4 mg Intravenous Given 08/06/15 2145)  iohexol (OMNIPAQUE) 300 MG/ML solution 100 mL (100 mLs Intravenous Contrast Given 08/06/15 2233)  sodium chloride 0.9 % bolus 1,000 mL (1,000 mLs Intravenous New Bag/Given 08/06/15 2326)  HYDROmorphone (DILAUDID) injection 1 mg (1 mg Intravenous Given 08/06/15 2252)  HYDROmorphone (DILAUDID) injection 1 mg (1 mg Intravenous Given 08/07/15 0044)    New Prescriptions   No medications on file    Final diagnoses:  Other pancreatitis    This  is a 54 y.o. male with a history of pancreatitis, hypertension, MI, coronary artery disease, who presents to the emergency department complaining of worsening epigastric and right upper quadrant abdominal pain for the past several days. He reports he's had pain for several weeks but has worsened in the past several days. He reports this feels like his previous episode of pancreatitis. He reports 10 out of 10 pain currently. Reports nausea but no vomiting. He reports his last bowel movement was yesterday and was normal. He reports drinking alcohol but none in the past week.  On exam the patient is afebrile nontoxic appearing. Patient has tenderness to his epigastric and right upper quadrant of his abdomen. CMP is unremarkable. CBC shows leukocytosis with a white count of 12,000. Hemoglobin is 10.4. Troponin is 0.03. Urinalysis shows small leukocytes and few bacteria. Urine sent for culture.  CT abdomen and pelvis indicates diffuse inflammatory changes of the right upper abdomen and 7 hepatic area with infected appearing collection at the head of the pancreas  and within the falciform ligament compatible with pseudocysts or abscesses. Plan for admission. Patient is in agreement with admission. Patient started on Zosyn. Patient admitted by Hospitalist Dr. Blaine Hamper. Requested me to contact surgery.  Dr. [name] spoken to and she will see the patient in the AM.  This patient was discussed with and evaluated by Dr. Lita Mains who agrees with assessment and plan.  Waynetta Pean, PA-C 08/07/15 [id number]  Julianne Rice, MD 08/10/15 [phone]

## 2015-08-06 NOTE — Telephone Encounter (Signed)
Caller name: Self   Can be reached: [phone]  Reason for call: Request refill on HYDROcodone-acetaminophen (NORCO) 7.5-325 MG tablet [id number] Patient is taking 3-4 pills at a time instead of the 1 prescribed.

## 2015-08-06 NOTE — ED Notes (Signed)
The pt is c/o rt upper abd pain for 2 months,  He reports that he has pancreatitis.  He was last seen here two months ago for the same.  He has been to see his doctors and he is supposed to see a gi doctor

## 2015-08-06 NOTE — ED Notes (Signed)
Pt being transported to x-ray

## 2015-08-06 NOTE — ED Notes (Signed)
Pt de-sated to 83%. Pt denies SOB. Pt placed on 2L [place] by EMT Dust[place]. O2 rebounded to 96%. Pt instructed on deep breathing.

## 2015-08-06 NOTE — ED Notes (Signed)
Patient to CT.

## 2015-08-07 ENCOUNTER — Encounter (HOSPITAL_COMMUNITY): Payer: Self-pay | Admitting: Physician Assistant

## 2015-08-07 DIAGNOSIS — Z800 Family history of malignant neoplasm of digestive organs: Secondary | ICD-10-CM | POA: Diagnosis not present

## 2015-08-07 DIAGNOSIS — I509 Heart failure, unspecified: Secondary | ICD-10-CM | POA: Diagnosis present

## 2015-08-07 DIAGNOSIS — K859 Acute pancreatitis without necrosis or infection, unspecified: Secondary | ICD-10-CM | POA: Insufficient documentation

## 2015-08-07 DIAGNOSIS — K760 Fatty (change of) liver, not elsewhere classified: Secondary | ICD-10-CM | POA: Diagnosis present

## 2015-08-07 DIAGNOSIS — E785 Hyperlipidemia, unspecified: Secondary | ICD-10-CM

## 2015-08-07 DIAGNOSIS — I252 Old myocardial infarction: Secondary | ICD-10-CM | POA: Diagnosis not present

## 2015-08-07 DIAGNOSIS — F17210 Nicotine dependence, cigarettes, uncomplicated: Secondary | ICD-10-CM | POA: Diagnosis present

## 2015-08-07 DIAGNOSIS — I25810 Atherosclerosis of coronary artery bypass graft(s) without angina pectoris: Secondary | ICD-10-CM | POA: Diagnosis not present

## 2015-08-07 DIAGNOSIS — D509 Iron deficiency anemia, unspecified: Secondary | ICD-10-CM | POA: Diagnosis present

## 2015-08-07 DIAGNOSIS — R932 Abnormal findings on diagnostic imaging of liver and biliary tract: Secondary | ICD-10-CM | POA: Diagnosis not present

## 2015-08-07 DIAGNOSIS — I10 Essential (primary) hypertension: Secondary | ICD-10-CM

## 2015-08-07 DIAGNOSIS — F17200 Nicotine dependence, unspecified, uncomplicated: Secondary | ICD-10-CM

## 2015-08-07 DIAGNOSIS — R1011 Right upper quadrant pain: Secondary | ICD-10-CM

## 2015-08-07 DIAGNOSIS — F419 Anxiety disorder, unspecified: Secondary | ICD-10-CM | POA: Diagnosis present

## 2015-08-07 DIAGNOSIS — Z79899 Other long term (current) drug therapy: Secondary | ICD-10-CM | POA: Diagnosis not present

## 2015-08-07 DIAGNOSIS — I110 Hypertensive heart disease with heart failure: Secondary | ICD-10-CM | POA: Diagnosis present

## 2015-08-07 DIAGNOSIS — F329 Major depressive disorder, single episode, unspecified: Secondary | ICD-10-CM | POA: Diagnosis present

## 2015-08-07 DIAGNOSIS — K429 Umbilical hernia without obstruction or gangrene: Secondary | ICD-10-CM | POA: Diagnosis present

## 2015-08-07 DIAGNOSIS — K219 Gastro-esophageal reflux disease without esophagitis: Secondary | ICD-10-CM | POA: Diagnosis present

## 2015-08-07 DIAGNOSIS — K861 Other chronic pancreatitis: Secondary | ICD-10-CM | POA: Diagnosis present

## 2015-08-07 DIAGNOSIS — I2510 Atherosclerotic heart disease of native coronary artery without angina pectoris: Secondary | ICD-10-CM | POA: Diagnosis present

## 2015-08-07 DIAGNOSIS — Z8673 Personal history of transient ischemic attack (TIA), and cerebral infarction without residual deficits: Secondary | ICD-10-CM | POA: Diagnosis not present

## 2015-08-07 DIAGNOSIS — F1010 Alcohol abuse, uncomplicated: Secondary | ICD-10-CM | POA: Diagnosis present

## 2015-08-07 DIAGNOSIS — K5900 Constipation, unspecified: Secondary | ICD-10-CM | POA: Diagnosis present

## 2015-08-07 DIAGNOSIS — Z7982 Long term (current) use of aspirin: Secondary | ICD-10-CM | POA: Diagnosis not present

## 2015-08-07 DIAGNOSIS — F1410 Cocaine abuse, uncomplicated: Secondary | ICD-10-CM

## 2015-08-07 DIAGNOSIS — Z951 Presence of aortocoronary bypass graft: Secondary | ICD-10-CM | POA: Diagnosis not present

## 2015-08-07 DIAGNOSIS — Z955 Presence of coronary angioplasty implant and graft: Secondary | ICD-10-CM | POA: Diagnosis not present

## 2015-08-07 LAB — RAPID URINE DRUG SCREEN, HOSP PERFORMED
Amphetamines: NOT DETECTED
BENZODIAZEPINES: NOT DETECTED
Barbiturates: NOT DETECTED
COCAINE: NOT DETECTED
OPIATES: POSITIVE — AB
Tetrahydrocannabinol: POSITIVE — AB

## 2015-08-07 LAB — COMPREHENSIVE METABOLIC PANEL
ALT: 10 U/L — ABNORMAL LOW (ref 17–63)
AST: 12 U/L — AB (ref 15–41)
Albumin: 2.6 g/dL — ABNORMAL LOW (ref 3.5–5.0)
Alkaline Phosphatase: 61 U/L (ref 38–126)
Anion gap: 11 (ref 5–15)
BUN: 10 mg/dL (ref 6–20)
CHLORIDE: 104 mmol/L (ref 101–111)
CO2: 23 mmol/L (ref 22–32)
Calcium: 7.9 mg/dL — ABNORMAL LOW (ref 8.9–10.3)
Creatinine, Ser: 0.75 mg/dL (ref 0.61–1.24)
Glucose, Bld: 146 mg/dL — ABNORMAL HIGH (ref 65–99)
POTASSIUM: 3.7 mmol/L (ref 3.5–5.1)
SODIUM: 138 mmol/L (ref 135–145)
Total Bilirubin: 0.7 mg/dL (ref 0.3–1.2)
Total Protein: 6.6 g/dL (ref 6.5–8.1)

## 2015-08-07 LAB — CBC
HEMATOCRIT: 30.4 % — AB (ref 39.0–52.0)
Hemoglobin: 9.1 g/dL — ABNORMAL LOW (ref 13.0–17.0)
MCH: 23.2 pg — ABNORMAL LOW (ref 26.0–34.0)
MCHC: 29.9 g/dL — ABNORMAL LOW (ref 30.0–36.0)
MCV: 77.4 fL — AB (ref 78.0–100.0)
Platelets: 339 10*3/uL (ref 150–400)
RBC: 3.93 MIL/uL — AB (ref 4.22–5.81)
RDW: 20.5 % — ABNORMAL HIGH (ref 11.5–15.5)
WBC: 10.6 10*3/uL — AB (ref 4.0–10.5)

## 2015-08-07 LAB — GLUCOSE, CAPILLARY: GLUCOSE-CAPILLARY: 98 mg/dL (ref 65–99)

## 2015-08-07 LAB — APTT: APTT: 41 s — AB (ref 24–37)

## 2015-08-07 LAB — PROTIME-INR
INR: 1.25 (ref 0.00–1.49)
PROTHROMBIN TIME: 15.9 s — AB (ref 11.6–15.2)

## 2015-08-07 LAB — TYPE AND SCREEN
ABO/RH(D): O POS
Antibody Screen: NEGATIVE

## 2015-08-07 MED ORDER — HYDROCODONE-ACETAMINOPHEN 7.5-325 MG PO TABS
1.0000 | ORAL_TABLET | Freq: Three times a day (TID) | ORAL | Status: DC | PRN
Start: 2015-08-07 — End: 2015-08-09
  Administered 2015-08-07 – 2015-08-09 (×6): 1 via ORAL
  Filled 2015-08-07 (×7): qty 1

## 2015-08-07 MED ORDER — FERROUS SULFATE 325 (65 FE) MG PO TABS
325.0000 mg | ORAL_TABLET | Freq: Every day | ORAL | Status: DC
  Administered 2015-08-07 – 2015-08-09 (×2): 325 mg via ORAL
  Filled 2015-08-07 (×3): qty 1

## 2015-08-07 MED ORDER — PIPERACILLIN-TAZOBACTAM 3.375 G IVPB 30 MIN
3.3750 g | Freq: Three times a day (TID) | INTRAVENOUS | Status: DC
  Filled 2015-08-07 (×2): qty 50

## 2015-08-07 MED ORDER — NITROGLYCERIN 0.4 MG SL SUBL: 0.4000 mg | SUBLINGUAL_TABLET | SUBLINGUAL | Status: DC | PRN

## 2015-08-07 MED ORDER — BISACODYL 10 MG RE SUPP: 10.0000 mg | Freq: Every day | RECTAL | Status: DC | PRN

## 2015-08-07 MED ORDER — FLEET ENEMA 7-19 GM/118ML RE ENEM
1.0000 | ENEMA | Freq: Once | RECTAL | Status: DC
  Filled 2015-08-07: qty 1

## 2015-08-07 MED ORDER — CITALOPRAM HYDROBROMIDE 20 MG PO TABS
20.0000 mg | ORAL_TABLET | Freq: Every day | ORAL | Status: DC
  Administered 2015-08-07 – 2015-08-08 (×3): 20 mg via ORAL
  Filled 2015-08-07 (×3): qty 1

## 2015-08-07 MED ORDER — HYDROMORPHONE HCL 1 MG/ML IJ SOLN
1.0000 mg | Freq: Once | INTRAMUSCULAR | Status: AC
  Administered 2015-08-07: 1 mg via INTRAVENOUS
  Filled 2015-08-07: qty 1

## 2015-08-07 MED ORDER — NICOTINE 21 MG/24HR TD PT24
21.0000 mg | MEDICATED_PATCH | Freq: Every day | TRANSDERMAL | Status: DC
  Administered 2015-08-07 – 2015-08-09 (×3): 21 mg via TRANSDERMAL
  Filled 2015-08-07 (×3): qty 1

## 2015-08-07 MED ORDER — SODIUM CHLORIDE 0.9 % IJ SOLN
3.0000 mL | Freq: Two times a day (BID) | INTRAMUSCULAR | Status: DC
  Administered 2015-08-07 – 2015-08-09 (×4): 3 mL via INTRAVENOUS

## 2015-08-07 MED ORDER — ASPIRIN 325 MG PO TABS
325.0000 mg | ORAL_TABLET | Freq: Every day | ORAL | Status: DC
  Administered 2015-08-07 – 2015-08-09 (×3): 325 mg via ORAL
  Filled 2015-08-07 (×3): qty 1

## 2015-08-07 MED ORDER — PIPERACILLIN-TAZOBACTAM 3.375 G IVPB
3.3750 g | Freq: Three times a day (TID) | INTRAVENOUS | Status: DC
Start: 2015-08-07 — End: 2015-08-09
  Administered 2015-08-07 – 2015-08-09 (×7): 3.375 g via INTRAVENOUS
  Filled 2015-08-07 (×10): qty 50

## 2015-08-07 MED ORDER — OXYCODONE HCL 5 MG PO TABS: 5.0000 mg | ORAL_TABLET | Freq: Once | ORAL | Status: DC

## 2015-08-07 MED ORDER — SIMVASTATIN 20 MG PO TABS
20.0000 mg | ORAL_TABLET | Freq: Every day | ORAL | Status: DC
  Administered 2015-08-07 – 2015-08-08 (×3): 20 mg via ORAL
  Filled 2015-08-07 (×3): qty 1

## 2015-08-07 MED ORDER — ONDANSETRON HCL 4 MG/2ML IJ SOLN: 4.0000 mg | Freq: Three times a day (TID) | INTRAMUSCULAR | Status: DC | PRN

## 2015-08-07 MED ORDER — PANTOPRAZOLE SODIUM 40 MG PO TBEC
40.0000 mg | DELAYED_RELEASE_TABLET | Freq: Two times a day (BID) | ORAL | Status: DC
  Administered 2015-08-07 – 2015-08-09 (×5): 40 mg via ORAL
  Filled 2015-08-07 (×5): qty 1

## 2015-08-07 MED ORDER — B COMPLEX-C PO TABS
1.0000 | ORAL_TABLET | Freq: Every day | ORAL | Status: DC
  Administered 2015-08-07 – 2015-08-09 (×3): 1 via ORAL
  Filled 2015-08-07 (×3): qty 1

## 2015-08-07 MED ORDER — METOPROLOL TARTRATE 12.5 MG HALF TABLET
12.5000 mg | ORAL_TABLET | Freq: Two times a day (BID) | ORAL | Status: DC
  Administered 2015-08-07 – 2015-08-09 (×5): 12.5 mg via ORAL
  Filled 2015-08-07 (×5): qty 1

## 2015-08-07 MED ORDER — OXYCODONE-ACETAMINOPHEN 5-325 MG PO TABS: 1.0000 | ORAL_TABLET | Freq: Three times a day (TID) | ORAL | Status: DC | PRN

## 2015-08-07 MED ORDER — HYDROMORPHONE HCL 1 MG/ML IJ SOLN
2.0000 mg | INTRAMUSCULAR | Status: DC | PRN
Start: 2015-08-07 — End: 2015-08-09
  Administered 2015-08-07 – 2015-08-08 (×9): 2 mg via INTRAVENOUS
  Filled 2015-08-07 (×9): qty 2

## 2015-08-07 MED ORDER — SODIUM CHLORIDE 0.9 % IV SOLN
INTRAVENOUS | Status: DC
  Administered 2015-08-07 – 2015-08-08 (×4): via INTRAVENOUS

## 2015-08-07 NOTE — Consult Note (Signed)
Reason for Consult:Pancreatic fluid collection Referring Physician: Ivor Costa, MD  [name]. is an 54 y.o. male.  HPI:  Patient is a 54 year old male with recent history of pancreatitis, hyperlipidemia, substance abuse, CABG, and depression presents with recurrent abdominal pain. He has a recent diagnosis of pancreatitis. He was doing better until the last several days when he started having progressively worsening upper abdominal pain. He states it is worse in the right upper quadrant. He has had some nausea, but no vomiting. He gives differing explanations of his alcohol use. He informed the hospitalist that he only drinks around 2 beers per week. He told me that he drank one sixpack per week, and he told our PA that he drinks at least 18 drinks per week.  Of note, he has had a recent ultrasound that does not demonstrate any gallstones. Because of the level of pain that he is having today, he was scanned. He was seen to have fluid collection around his pancreas.  He also has an umbilical hernia that is normally reducible, but is bothering him more with this episode of pancreatitis.  Past Medical History  Diagnosis Date  . Hypertension   . History of tobacco abuse     still smokes  . Myocardial infarction [facility]) 12/07/2000    STEMI March 2010, angioplasty of Circumflex and emergent CABG November 07, 2008, bare metal stent to SVG to Circumflex with bare metal stent  November 2010.   [name] Chest pain   . Coronary artery disease   . Cerebrovascular disease ~2008 and 02-2015  . Abnormality of gait   . Cocaine use     denies current use   . CHF (congestive heart failure) ([place])   . Pancreatitis     Past Surgical History  Procedure Laterality Date  . Cardiac catheterization  11/07/2008    EF of 50-55% -- normal LV systolic function, acute inferolateral ST elevation MI,  PTCA of the circumflex artery -- [name]. [name], [name]  . Coronary artery bypass graft    . Left heart catheterization  with coronary/graft angiogram N/A 05/15/2013    Procedure: LEFT HEART CATHETERIZATION WITH Beatrix Fetters;  Surgeon: [name], MD;  Location: [facility] CATH LAB;  Service: Cardiovascular;  Laterality: N/A;    Family History  Problem Relation Age of Onset  . Heart disease Mother   . Heart failure Mother   . Diabetes Mother   . Prostate cancer Neg Hx   . Colon cancer Father     late 14s    Social History:  reports that he has been smoking Cigarettes.  He has a 30 pack-year smoking history. He has never used smokeless tobacco. He reports that he drinks alcohol. He reports that he does not use illicit drugs.  Allergies: No Known Allergies  Medications:  Prior to Admission:  Prescriptions prior to admission  Medication Sig Dispense Refill Last Dose  . acetaminophen (TYLENOL) 500 MG tablet Take 500-1,000 mg by mouth every 8 (eight) hours as needed for mild pain.    Past Week at Unknown time  . aspirin 325 MG tablet Take 1 tablet (325 mg total) by mouth daily.   08/06/2015 at Unknown time  . b complex vitamins tablet Take 1 tablet by mouth daily.   08/06/2015 at Unknown time  . citalopram (CELEXA) 20 MG tablet Take 1 tablet (20 mg total) by mouth daily. (Patient taking differently: Take 20 mg by mouth at bedtime. ) 30 tablet 3 08/05/2015 at Unknown  time  . HYDROcodone-acetaminophen (NORCO) 7.5-325 MG tablet Take 1 tablet by mouth 3 (three) times daily as needed for moderate pain. 40 tablet 0 08/06/2015 at Unknown time  . metoprolol tartrate (LOPRESSOR) 25 MG tablet Take 0.5 tablets (12.5 mg total) by mouth 2 (two) times daily. 60 tablet 11 08/06/2015 at 0800  . nitroGLYCERIN (NITROSTAT) 0.4 MG SL tablet Place 1 tablet (0.4 mg total) under the tongue every 5 (five) minutes as needed for chest pain. 25 tablet 6 unknown at unknown  . simvastatin (ZOCOR) 20 MG tablet Take 1 tablet (20 mg total) by mouth at bedtime. 30 tablet 11 08/05/2015 at Unknown time  . ferrous sulfate 325 (65 FE) MG  tablet Take 325 mg by mouth daily with breakfast. Reported on 08/06/2015   Not Taking at Unknown time  . Na Sulfate-K Sulfate-Mg Sulf SOLN Take 1 kit by mouth once. (Patient not taking: Reported on 07/30/2015) 354 mL 0 Not Taking at Unknown time  . pantoprazole (PROTONIX) 40 MG tablet Take 1 tablet (40 mg total) by mouth 2 (two) times daily before a meal. (Patient not taking: Reported on 08/06/2015) 60 tablet 3 Not Taking at Unknown time    Results for orders placed or performed during the hospital encounter of 08/06/15 (from the past 48 hour(s))  Urinalysis, Routine w reflex microscopic (not at [facility])     Status: Abnormal   Collection Time: 08/06/15  8:29 PM  Result Value Ref Range   Color, Urine YELLOW YELLOW   APPearance CLEAR CLEAR   Specific Gravity, Urine 1.034 (H) 1.005 - 1.030   pH 5.0 5.0 - 8.0   Glucose, UA NEGATIVE NEGATIVE mg/dL   Hgb urine dipstick NEGATIVE NEGATIVE   Bilirubin Urine SMALL (A) NEGATIVE   Ketones, ur 15 (A) NEGATIVE mg/dL   Protein, ur NEGATIVE NEGATIVE mg/dL   Nitrite NEGATIVE NEGATIVE   Leukocytes, UA SMALL (A) NEGATIVE  Urine microscopic-add on     Status: Abnormal   Collection Time: 08/06/15  8:29 PM  Result Value Ref Range   Squamous Epithelial / LPF 0-5 (A) NONE SEEN   WBC, UA 6-30 0 - 5 WBC/hpf   RBC / HPF 0-5 0 - 5 RBC/hpf   Bacteria, UA FEW (A) NONE SEEN   Casts GRANULAR CAST (A) NEGATIVE    Comment: HYALINE CASTS   Urine-Other MUCOUS PRESENT   Lipase, blood     Status: Abnormal   Collection Time: 08/06/15  8:34 PM  Result Value Ref Range   Lipase 70 (H) 11 - 51 U/L  Comprehensive metabolic panel     Status: Abnormal   Collection Time: 08/06/15  8:34 PM  Result Value Ref Range   Sodium 138 135 - 145 mmol/L   Potassium 3.9 3.5 - 5.1 mmol/L   Chloride 102 101 - 111 mmol/L   CO2 24 22 - 32 mmol/L   Glucose, Bld 109 (H) 65 - 99 mg/dL   BUN 13 6 - 20 mg/dL   Creatinine, Ser 0.93 0.61 - 1.24 mg/dL   Calcium 8.7 (L) 8.9 - 10.3 mg/dL   Total  Protein 7.6 6.5 - 8.1 g/dL   Albumin 3.1 (L) 3.5 - 5.0 g/dL   AST 14 (L) 15 - 41 U/L   ALT 10 (L) 17 - 63 U/L   Alkaline Phosphatase 69 38 - 126 U/L   Total Bilirubin 0.5 0.3 - 1.2 mg/dL   GFR calc non Af Amer >60 >60 mL/min   GFR calc Af Amer >60 >60  mL/min    Comment: (NOTE) The eGFR has been calculated using the CKD EPI equation. This calculation has not been validated in all clinical situations. eGFR's persistently <60 mL/min signify possible Chronic Kidney Disease.    Anion gap 12 5 - 15  CBC with Differential     Status: Abnormal   Collection Time: 08/06/15  8:34 PM  Result Value Ref Range   WBC 11.9 (H) 4.0 - 10.5 K/uL   RBC 4.38 4.22 - 5.81 MIL/uL   Hemoglobin 10.4 (L) 13.0 - 17.0 g/dL   HCT 33.9 (L) 39.0 - 52.0 %   MCV 77.4 (L) 78.0 - 100.0 fL   MCH 23.7 (L) 26.0 - 34.0 pg   MCHC 30.7 30.0 - 36.0 g/dL   RDW 20.6 (H) 11.5 - 15.5 %   Platelets 411 (H) 150 - 400 K/uL   Neutrophils Relative % 78 %   Neutro Abs 9.2 (H) 1.7 - 7.7 K/uL   Lymphocytes Relative 9 %   Lymphs Abs 1.1 0.7 - 4.0 K/uL   Monocytes Relative 11 %   Monocytes Absolute 1.3 (H) 0.1 - 1.0 K/uL   Eosinophils Relative 2 %   Eosinophils Absolute 0.3 0.0 - 0.7 K/uL   Basophils Relative 0 %   Basophils Absolute 0.0 0.0 - 0.1 K/uL  Troponin I     Status: None   Collection Time: 08/06/15  8:34 PM  Result Value Ref Range   Troponin I 0.03 <0.031 ng/mL    Comment:        NO INDICATION OF MYOCARDIAL INJURY.   Culture, blood (Routine X 2) w Reflex to ID Panel     Status: None (Preliminary result)   Collection Time: 08/07/15  1:40 AM  Result Value Ref Range   Specimen Description BLOOD LEFT HAND    Special Requests BOTTLES DRAWN AEROBIC AND ANAEROBIC 5CC    Culture PENDING    Report Status PENDING   Type and screen [place]     Status: None   Collection Time: 08/07/15  1:45 AM  Result Value Ref Range   ABO/RH(D) O POS    Antibody Screen NEG    Sample Expiration 08/10/2015    Culture, blood (Routine X 2) w Reflex to ID Panel     Status: None (Preliminary result)   Collection Time: 08/07/15  1:45 AM  Result Value Ref Range   Specimen Description BLOOD LEFT ASSIST CONTROL    Special Requests BOTTLES DRAWN AEROBIC AND ANAEROBIC 5ML    Culture PENDING    Report Status PENDING   Comprehensive metabolic panel     Status: Abnormal   Collection Time: 08/07/15  2:06 AM  Result Value Ref Range   Sodium 138 135 - 145 mmol/L   Potassium 3.7 3.5 - 5.1 mmol/L   Chloride 104 101 - 111 mmol/L   CO2 23 22 - 32 mmol/L   Glucose, Bld 146 (H) 65 - 99 mg/dL   BUN 10 6 - 20 mg/dL   Creatinine, Ser 0.75 0.61 - 1.24 mg/dL   Calcium 7.9 (L) 8.9 - 10.3 mg/dL   Total Protein 6.6 6.5 - 8.1 g/dL   Albumin 2.6 (L) 3.5 - 5.0 g/dL   AST 12 (L) 15 - 41 U/L   ALT 10 (L) 17 - 63 U/L   Alkaline Phosphatase 61 38 - 126 U/L   Total Bilirubin 0.7 0.3 - 1.2 mg/dL   GFR calc non Af Amer >60 >60 mL/min   GFR calc  Af Amer >60 >60 mL/min    Comment: (NOTE) The eGFR has been calculated using the CKD EPI equation. This calculation has not been validated in all clinical situations. eGFR's persistently <60 mL/min signify possible Chronic Kidney Disease.    Anion gap 11 5 - 15  CBC     Status: Abnormal   Collection Time: 08/07/15  2:06 AM  Result Value Ref Range   WBC 10.6 (H) 4.0 - 10.5 K/uL   RBC 3.93 (L) 4.22 - 5.81 MIL/uL   Hemoglobin 9.1 (L) 13.0 - 17.0 g/dL   HCT 30.4 (L) 39.0 - 52.0 %   MCV 77.4 (L) 78.0 - 100.0 fL   MCH 23.2 (L) 26.0 - 34.0 pg   MCHC 29.9 (L) 30.0 - 36.0 g/dL   RDW 20.5 (H) 11.5 - 15.5 %   Platelets 339 150 - 400 K/uL  Protime-INR     Status: Abnormal   Collection Time: 08/07/15  2:07 AM  Result Value Ref Range   Prothrombin Time 15.9 (H) 11.6 - 15.2 seconds   INR 1.25 0.00 - 1.49  APTT     Status: Abnormal   Collection Time: 08/07/15  2:07 AM  Result Value Ref Range   aPTT 41 (H) 24 - 37 seconds    Comment:        IF BASELINE aPTT IS ELEVATED, SUGGEST  PATIENT RISK ASSESSMENT BE USED TO DETERMINE APPROPRIATE ANTICOAGULANT THERAPY.   Glucose, capillary     Status: None   Collection Time: 08/07/15  8:08 AM  Result Value Ref Range   Glucose-Capillary 98 65 - 99 mg/dL   Comment 1 Notify RN     Ct Abdomen Pelvis W Contrast  08/06/2015  CLINICAL DATA:  54 year old male EXAM: CT ABDOMEN AND PELVIS WITH CONTRAST TECHNIQUE: Multidetector CT imaging of the abdomen and pelvis was performed using the standard protocol following bolus administration of intravenous contrast. CONTRAST:  132m OMNIPAQUE IOHEXOL 300 MG/ML  SOLN COMPARISON:  CT dated 06/16/2015 FINDINGS: Minimal bibasilar dependent atelectatic changes. Mild cardiomegaly. There is coronary vascular calcification. No intra-abdominal free air.  No free fluid. There is inflammatory changes of the fat adjacent to the head of the pancreas. There is a fluid collection with enhancing rim anterior to the uncinate process of the pancreas measuring 2.4 x 3.7 cm in axial dimensions and 2.8 cm in craniocaudal length compatible with a pseudocyst. Is there is diffuse inflammatory changes and stranding of the mesentery in the right upper abdomen and subhepatic region. There is a 3.0 x 1.1 cm fluid collection in the falciform ligament compatible with a pseudocyst/abscess. There is mild inflammatory changes of the fat surrounding the gallbladder fundus, likely extension of inflammation of the pancreas. The gallbladder is otherwise unremarkable. The liver, spleen, and adrenal glands appear unremarkable. There is no hydronephrosis. Bilateral renal vascular calcifications noted. The visualized ureters appear unremarkable. The urinary bladder is only partially distended. There is apparent diffuse thickening of the bladder wall which may be partly related to underdistention. Cystitis is not excluded. Correlation with urinalysis recommended. The prostate gland is grossly unremarkable. There is dense stool within the colon as  well as rectum compatible with constipation with possible fecal impaction within the rectum. There is a moderate size sliding hiatal hernia. No evidence of bowel obstruction or inflammation. Normal appendix. There is aortoiliac atherosclerotic disease. There is a 2.7 cm infrarenal abdominal aortic ectasia. The IVC appear patent. No portal venous gas identified. Top-normal pancreaticocaval and portocaval lymph nodes noted. No significant  adenopathy. Small fat containing umbilical hernia. The osseous structures appear unremarkable. IMPRESSION: Diffuse inflammatory changes of the right upper abdomen and subhepatic area with infected appearing collection at the head of the pancreas and within the falciform ligament compatible with pseudocysts or abscesses. No evidence of bowel obstruction or inflammation.  Normal appendix. Electronically Signed   By: Anner Crete [name]   On: 08/06/2015 23:08    Review of Systems  Constitutional: Negative.   HENT: Negative.   Eyes: Negative.   Respiratory: Negative.   Cardiovascular: Negative.   Gastrointestinal: Positive for nausea. Negative for diarrhea and constipation.  Genitourinary: Negative.   Musculoskeletal: Negative.   Skin: Negative.   Neurological: Negative.   Endo/Heme/Allergies: Negative.   Psychiatric/Behavioral: Positive for substance abuse (EtOH). Negative for depression, suicidal ideas and hallucinations. The patient is not nervous/anxious.   All other systems reviewed and are negative.  Blood pressure 128/66, pulse 71, temperature 98.1 F (36.7 C), temperature source Oral, resp. rate 18, height '5\' 11"'  (1.803 m), weight 97.07 kg (214 lb), SpO2 99 %. Physical Exam  Constitutional: He appears well-developed and well-nourished. No distress.  HENT:  Head: Normocephalic and atraumatic.  Eyes: Conjunctivae are normal. Pupils are equal, round, and reactive to light.  Neck: Normal range of motion. Neck supple. No thyromegaly present.   Cardiovascular: Normal rate and intact distal pulses.   Respiratory: Effort normal. He exhibits no tenderness.  GI: Soft. He exhibits distension. There is tenderness (RUQ worst, upper abdomen also tender.  Umbilical hernia is reducible, but tender.  Lower abdomen minimally tender). There is guarding (voluntary guarding upper abdomen.  ). There is no rebound.  Musculoskeletal: He exhibits no edema or tenderness.  Neurological: He is alert. Coordination normal.  Skin: Skin is warm and dry. No rash noted. He is not diaphoretic. No erythema. No pallor.  Psychiatric: He has a normal mood and affect. His behavior is normal. Judgment and thought content normal.    Assessment/Plan: Pancreatitis with pancreatic fluid collections - likely developing pseudocysts.  Would treat pancreatitis with supportive care at this time including NPO and IV fluid.  If cyst matures, he will likely need cyst gastrostomy.  I do not think the fluid collections are actively infected or he would be much sicker.  No evidence of gallstone disease.    EtOH abuse - needs to quit drinking in order to minimize recurrence of pancreatitis.    [name] 08/07/2015, 8:54 AM

## 2015-08-07 NOTE — Progress Notes (Signed)
[facility] PROGRESS NOTE  [name]. [id number] DOB: [birth year] DOA: 08/06/2015 PCP: [name], MD  Assessment/Plan:   Acute pancreatitis- CT abdomen/pelvis showed infected appearing collection at the head of the pancreas and within the falciform ligament compatible with pseudocysts or abscesses.Patient started on zosyn. Lipase 70. GI has seen the patient and recommend that if symptoms do not improve may need transpapillary stent or drainage via EUS with a tertiary care hospital [facility] or [facility]), as these interventions are not available at our hospital.  Hypertension- continue metoprolol  CAD- s/p stents, subsequent CABG. Continue Aspsirin, Metoprolol, Zocor.  ? Alcohol abuse- patient denies alcohol abuse.  Hyperlipidemia- continue Zocor.   Code Status: Full code Family Communication: No family present at bedside Disposition Plan: Pending GI evaluation   Consultants:  GI  Procedures:  None  Antibiotics:  Zosyn  HPI/Subjective: 54 y.o. male with PMH of pancreatitis, hypertension, hyperlipidemia, GERD, depression, CAD, S/P of CABG, tobacco abuse, alcohol abuse, stroke, cocaine abuse, who presents with abdominal pain. Patient reports that he was recently diagnosed as pancreatitis in November, with lipase elevated up to 153. He has mild abdominal pain at baseline, which has worsened today. His abdominal pain is located in the right upper quadrant, constant, 10 out of 10 in severity, sharp, nonradiating. It is not associated with nausea vomiting or diarrhea. No fever, chills. He had transient oxygen desaturation to 83% in emergency room, which quickly improved to normal when placed on 2L [place]. Patient denies chest pain or shortness of breath. No cough, symptoms of UTI or unilateral weakness. Patient strongly denies alcohol abuse, states that he drinks 1 or 2 beers occasionally, last drinking was 2 weeks ago.  This morning patient feels better , ate food without  nausea and vomiting.   Objective: Filed Vitals:   08/07/15 0626 08/07/15 1431  BP: 128/66 126/65  Pulse: 71 69  Temp: 98.1 F (36.7 C) 97.6 F (36.4 C)  Resp: 18 18    Intake/Output Summary (Last 24 hours) at 08/07/15 1838 Last data filed at 08/07/15 1823  Gross per 24 hour  Intake   1240 ml  Output    750 ml  Net    490 ml   Filed Weights   08/07/15 0216  Weight: 97.07 kg (214 lb)    Exam:   General:  Appears in  No acute distress  Cardiovascular: S1S2 RRR  Respiratory: Clear bilaterally  Abdomen: Soft, mild epigastric tenderness to palpation  Musculoskeletal: No c/c/e  Data Reviewed: Basic Metabolic Panel:  Recent Labs Lab 08/06/15 2034 08/07/15 0206  NA 138 138  K 3.9 3.7  CL 102 104  CO2 24 23  GLUCOSE 109* 146*  BUN 13 10  CREATININE 0.93 0.75  CALCIUM 8.7* 7.9*   Liver Function Tests:  Recent Labs Lab 08/06/15 2034 08/07/15 0206  AST 14* 12*  ALT 10* 10*  ALKPHOS 69 61  BILITOT 0.5 0.7  PROT 7.6 6.6  ALBUMIN 3.1* 2.6*    Recent Labs Lab 08/06/15 2034  LIPASE 70*   No results for input(s): AMMONIA in the last 168 hours. CBC:  Recent Labs Lab 08/06/15 2034 08/07/15 0206  WBC 11.9* 10.6*  NEUTROABS 9.2*  --   HGB 10.4* 9.1*  HCT 33.9* 30.4*  MCV 77.4* 77.4*  PLT 411* 339   Cardiac Enzymes:  Recent Labs Lab 08/06/15 2034  TROPONINI 0.03    CBG:  Recent Labs Lab 08/07/15 0808  GLUCAP 98    Recent Results (from  the past 240 hour(s))  Culture, blood (Routine X 2) w Reflex to ID Panel     Status: None (Preliminary result)   Collection Time: 08/07/15  1:40 AM  Result Value Ref Range Status   Specimen Description BLOOD LEFT HAND  Final   Special Requests BOTTLES DRAWN AEROBIC AND ANAEROBIC 5CC  Final   Culture PENDING  Incomplete   Report Status PENDING  Incomplete  Culture, blood (Routine X 2) w Reflex to ID Panel     Status: None (Preliminary result)   Collection Time: 08/07/15  1:45 AM  Result Value Ref  Range Status   Specimen Description BLOOD LEFT ASSIST CONTROL  Final   Special Requests BOTTLES DRAWN AEROBIC AND ANAEROBIC 5ML  Final   Culture PENDING  Incomplete   Report Status PENDING  Incomplete     Studies: Ct Abdomen Pelvis W Contrast  08/06/2015  CLINICAL DATA:  54 year old male EXAM: CT ABDOMEN AND PELVIS WITH CONTRAST TECHNIQUE: Multidetector CT imaging of the abdomen and pelvis was performed using the standard protocol following bolus administration of intravenous contrast. CONTRAST:  173mL OMNIPAQUE IOHEXOL 300 MG/ML  SOLN COMPARISON:  CT dated 06/16/2015 FINDINGS: Minimal bibasilar dependent atelectatic changes. Mild cardiomegaly. There is coronary vascular calcification. No intra-abdominal free air.  No free fluid. There is inflammatory changes of the fat adjacent to the head of the pancreas. There is a fluid collection with enhancing rim anterior to the uncinate process of the pancreas measuring 2.4 x 3.7 cm in axial dimensions and 2.8 cm in craniocaudal length compatible with a pseudocyst. Is there is diffuse inflammatory changes and stranding of the mesentery in the right upper abdomen and subhepatic region. There is a 3.0 x 1.1 cm fluid collection in the falciform ligament compatible with a pseudocyst/abscess. There is mild inflammatory changes of the fat surrounding the gallbladder fundus, likely extension of inflammation of the pancreas. The gallbladder is otherwise unremarkable. The liver, spleen, and adrenal glands appear unremarkable. There is no hydronephrosis. Bilateral renal vascular calcifications noted. The visualized ureters appear unremarkable. The urinary bladder is only partially distended. There is apparent diffuse thickening of the bladder wall which may be partly related to underdistention. Cystitis is not excluded. Correlation with urinalysis recommended. The prostate gland is grossly unremarkable. There is dense stool within the colon as well as rectum compatible with  constipation with possible fecal impaction within the rectum. There is a moderate size sliding hiatal hernia. No evidence of bowel obstruction or inflammation. Normal appendix. There is aortoiliac atherosclerotic disease. There is a 2.7 cm infrarenal abdominal aortic ectasia. The IVC appear patent. No portal venous gas identified. Top-normal pancreaticocaval and portocaval lymph nodes noted. No significant adenopathy. Small fat containing umbilical hernia. The osseous structures appear unremarkable. IMPRESSION: Diffuse inflammatory changes of the right upper abdomen and subhepatic area with infected appearing collection at the head of the pancreas and within the falciform ligament compatible with pseudocysts or abscesses. No evidence of bowel obstruction or inflammation.  Normal appendix. Electronically Signed   By: Anner Crete [name]   On: 08/06/2015 23:08    Scheduled Meds: . aspirin  325 mg Oral Daily  . B-complex with vitamin C  1 tablet Oral Daily  . citalopram  20 mg Oral QHS  . ferrous sulfate  325 mg Oral Q breakfast  . metoprolol tartrate  12.5 mg Oral BID  . nicotine  21 mg Transdermal Daily  . pantoprazole  40 mg Oral BID AC  . piperacillin-tazobactam (ZOSYN)  IV  3.375 g Intravenous 3 times per day  . simvastatin  20 mg Oral QHS  . sodium chloride  3 mL Intravenous Q12H  . sodium phosphate  1 enema Rectal Once   Continuous Infusions: . sodium chloride 125 mL/hr at 08/07/15 1738    Principal Problem:   Abdominal pain Active Problems:   Dyslipidemia   TOBACCO ABUSE   HYPERTENSION, BENIGN ESSENTIAL   CAD, stents, subsequent CABG   Depression   Stroke ~ 2008, 02-2015   Alcohol abuse   Hx of cocaine abuse   Pancreatitis   GERD (gastroesophageal reflux disease)    Time spent: 20 min    [facility] Pager [phone]. If 7PM-7AM, please contact night-coverage at [email or web address], password [facility] 08/07/2015, 6:38 PM  LOS: 0 days

## 2015-08-07 NOTE — Progress Notes (Signed)
PT Cancellation Note/Discharge  Patient Details Name: [name]. MRN: [id number] DOB: 1961-07-11   Cancelled Treatment:    Reason Eval/Treat Not Completed: PT screened, no needs identified, will sign off.  I spoke with pt and RN who both report independence.  Pt has been walking the hallways and mobilizing in his room on his own.  Pt reports he has no issues with balance, mobility or self care items.  PT to sign off.   Thanks,    Barbarann Ehlers. Geoffery Aultman, PT, DPT [phone]   08/07/2015, 2:44 PM

## 2015-08-07 NOTE — Progress Notes (Signed)
Patient refusing to have scheduled enema.

## 2015-08-07 NOTE — Telephone Encounter (Signed)
Patient is currently in the hospital.

## 2015-08-07 NOTE — Progress Notes (Signed)
[facility] Progress Note     Subjective: Pt c/o continued epigastric and RUQ abdominal pain.  No N/V.  He's thirsty, but not very hungry.  He says his hernia hurts some and he feels very bloated.  Wants to go home, doesn't understand why he can't eat.  No BM, but some flatus.  Says his urine has been dark and almost looked bloody.  Says he doesn't always drink everyday, but does drink 18 beers/week, denies drinking hard liquor.   Objective: Vital signs in last 24 hours: Temp:  [97.9 F (36.6 C)-98.9 F (37.2 C)] 98.1 F (36.7 C) (12/28 0626) Pulse Rate:  [71-87] 71 (12/28 0626) Resp:  [16-18] 18 (12/28 0626) BP: (122-165)/(66-91) 128/66 mmHg (12/28 0626) SpO2:  [92 %-99 %] 99 % (12/28 0626) Weight:  [97.07 kg (214 lb)] 97.07 kg (214 lb) (12/28 0216) Last BM Date: 08/06/15  Intake/Output from previous day: 12/27 0701 - 12/28 0700 In: 1000 [I.V.:1000] Out: -  Intake/Output this shift:    PE: Gen:  Alert, NAD, pleasant Abd: Obese, soft, distended, tender to palpation all over greatest in the RUQ and epigastrium and over hernia, hernia has a ring of mild erythema, hernia is not reducible and is too tender to attempt to reduce, +BS, no HSM, no abdominal scars noted   Lab Results:   Recent Labs  08/06/15 2034 08/07/15 0206  WBC 11.9* 10.6*  HGB 10.4* 9.1*  HCT 33.9* 30.4*  PLT 411* 339   BMET  Recent Labs  08/06/15 2034 08/07/15 0206  NA 138 138  K 3.9 3.7  CL 102 104  CO2 24 23  GLUCOSE 109* 146*  BUN 13 10  CREATININE 0.93 0.75  CALCIUM 8.7* 7.9*   PT/INR  Recent Labs  08/07/15 0207  LABPROT 15.9*  INR 1.25   CMP     Component Value Date/Time   NA 138 08/07/2015 0206   K 3.7 08/07/2015 0206   CL 104 08/07/2015 0206   CO2 23 08/07/2015 0206   GLUCOSE 146* 08/07/2015 0206   BUN 10 08/07/2015 0206   CREATININE 0.75 08/07/2015 0206   CREATININE 0.74 05/31/2015 1648   CALCIUM 7.9* 08/07/2015 0206   PROT 6.6 08/07/2015 0206   ALBUMIN  2.6* 08/07/2015 0206   AST 12* 08/07/2015 0206   ALT 10* 08/07/2015 0206   ALKPHOS 61 08/07/2015 0206   BILITOT 0.7 08/07/2015 0206   GFRNONAA >60 08/07/2015 0206   GFRAA >60 08/07/2015 0206   Lipase     Component Value Date/Time   LIPASE 70* 08/06/2015 2034       Studies/Results: Ct Abdomen Pelvis W Contrast  08/06/2015  CLINICAL DATA:  54 year old male EXAM: CT ABDOMEN AND PELVIS WITH CONTRAST TECHNIQUE: Multidetector CT imaging of the abdomen and pelvis was performed using the standard protocol following bolus administration of intravenous contrast. CONTRAST:  145mL OMNIPAQUE IOHEXOL 300 MG/ML  SOLN COMPARISON:  CT dated 06/16/2015 FINDINGS: Minimal bibasilar dependent atelectatic changes. Mild cardiomegaly. There is coronary vascular calcification. No intra-abdominal free air.  No free fluid. There is inflammatory changes of the fat adjacent to the head of the pancreas. There is a fluid collection with enhancing rim anterior to the uncinate process of the pancreas measuring 2.4 x 3.7 cm in axial dimensions and 2.8 cm in craniocaudal length compatible with a pseudocyst. Is there is diffuse inflammatory changes and stranding of the mesentery in the right upper abdomen and subhepatic region. There is a 3.0 x 1.1 cm fluid collection in  the falciform ligament compatible with a pseudocyst/abscess. There is mild inflammatory changes of the fat surrounding the gallbladder fundus, likely extension of inflammation of the pancreas. The gallbladder is otherwise unremarkable. The liver, spleen, and adrenal glands appear unremarkable. There is no hydronephrosis. Bilateral renal vascular calcifications noted. The visualized ureters appear unremarkable. The urinary bladder is only partially distended. There is apparent diffuse thickening of the bladder wall which may be partly related to underdistention. Cystitis is not excluded. Correlation with urinalysis recommended. The prostate gland is grossly  unremarkable. There is dense stool within the colon as well as rectum compatible with constipation with possible fecal impaction within the rectum. There is a moderate size sliding hiatal hernia. No evidence of bowel obstruction or inflammation. Normal appendix. There is aortoiliac atherosclerotic disease. There is a 2.7 cm infrarenal abdominal aortic ectasia. The IVC appear patent. No portal venous gas identified. Top-normal pancreaticocaval and portocaval lymph nodes noted. No significant adenopathy. Small fat containing umbilical hernia. The osseous structures appear unremarkable. IMPRESSION: Diffuse inflammatory changes of the right upper abdomen and subhepatic area with infected appearing collection at the head of the pancreas and within the falciform ligament compatible with pseudocysts or abscesses. No evidence of bowel obstruction or inflammation.  Normal appendix. Electronically Signed   By: Anner Crete [name]   On: 08/06/2015 23:08    Anti-infectives: Anti-infectives    Start     Dose/Rate Route Frequency Ordered Stop   08/07/15 0600  piperacillin-tazobactam (ZOSYN) IVPB 3.375 g     3.375 g 12.5 mL/hr over 240 Minutes Intravenous 3 times per day 08/07/15 0223     08/07/15 0130  piperacillin-tazobactam (ZOSYN) IVPB 3.375 g  Status:  Discontinued     3.375 g 100 mL/hr over 30 Minutes Intravenous 3 times per day 08/07/15 0119 08/07/15 0223   08/07/15 0000  piperacillin-tazobactam (ZOSYN) IVPB 3.375 g     3.375 g 100 mL/hr over 30 Minutes Intravenous  Once 08/06/15 2356 08/07/15 0104       Assessment/Plan Chronic pancreatitis with multiple pseudocyst formation  -No gallstones on [place] from November 2016, more likely due to alcohol use -No need for lap chole given no stones.  No surgical intervention until the cyst wall is mature, will likely need referral to tertiary care center for cyst gastrostomy -Would not recommend aspiration/drainage of the cyst at this time -NPO, pain control,  antiemetics, IVF hydration -Would keep NPO until pain improved, if pain does not improve, may need post LOT tube feedings -Lipase mildly elevated in November and now -Triglycerides were normal in October 2016, mildly elevated in November -Recommend GI workup  Constipation/impaction - Enemas Umbilical hernia - normally reducible, but because he's so distended from impaction his hernia is more pronounced and painful.  No urgent needs for surgery at this time, but watch the redness around the hernia. Alcohol use - admits to 18 beers/week    LOS: 0 days    Nat Christen 08/07/2015, 8:17 AM Pager: [phone]

## 2015-08-07 NOTE — Consult Note (Signed)
[facility] Consult: 11:26 AM 08/07/2015  LOS: 0 days    Referring Provider: Dr. Darrick Meigs  Primary Care Physician:  [name], MD Primary Gastroenterologist:  Dr. Hilarie Fredrickson    Reason for Consultation:  Pancreatitis and fluid collectionl    HPI: [name]. is a 54 y.o. male.  Hx 2002 MI, s/p angioplasty.  CABG 10/2008, incidental finding of significant emphysema led to bleb resection along with CABG. BMS placed 06/2009. Depression, anxiety.  2008 and 02/2015 CVAs.    ETOH and substance abuse.  Umbilical hernias.  Hx iron deficiency anemia but does not tolerate po iron.  "idopathic acute" pancreatitis dx on ED visit 06/16/15, mild by CT; ultrasound with fatty liver, 5 mm CBD, no stones, obscured pancreas.   Normal LFTs, triglycerides 177.  Lipase 83 (rose to 153 on 11/15 but back to 83 by 12/20).  07/16/15 GI OV with PA.  C/o heartburn for several months. Stool was FOBT +.  + family hx colon cancer.  Set up for 09/13/15 colonoscopy/EGD.   Persistent abdominal pain on PMD visit 12/20.  Dr [name] increased PPI to BID and hydrocodone from  5 to 7.5 mg.  Pt taking 3 to 4 of hydro instead of the Rxd 1 pill at a time. Patient says that the narcotics are sometimes pretty effective but he can definitely tell when they wear off. He even says that taking Tylenol can sometimes be effective.  Returned to ED 12/26 with his pain in RUQ but no n/v, no radiation. Has had diaphoresis but no documented fevers His belly feels tight and swollen. . Although appetite reduced, weight is up 3#.  No n/v and tolerating solids.  Previous ETOH was budweiser, mostly on weekends, about 6 cans per day.  Did have 1 or 2 cans beer over this past holiday weekend.   No fever, WBCs normal. Lipase 70, normal LFTs. Patient's tox screen was positive for opiates,  explained by his prescription. However it is also positive for [facility] and THC was positive in July of this year as well as in 2010. 12/26 CT showed diffuse inflammation of RUQ, subhepatic region, infected appearance to fluid collection at head of pancrease (pseudocyst vs abscess).  Dr [name] saw pt this AM: Pancreatitis with likely evolving pseudocysts, suggests supportive care including NPO, IVF.  Pt not sick enough to suggest infected pseudocysts.         Past Medical History  Diagnosis Date  . Hypertension   . History of tobacco abuse     still smokes  . Myocardial infarction [facility]) 12/07/2000    STEMI March 2010, angioplasty of Circumflex and emergent CABG November 07, 2008, bare metal stent to SVG to Circumflex with bare metal stent  November 2010.   [name] Chest pain   . Coronary artery disease   . Cerebrovascular disease ~2008 and 02-2015  . Abnormality of gait   . Cocaine use     denies current use   . CHF (congestive heart failure) ([place])   . Pancreatitis 06/2015  . Iron deficiency anemia 2010  Past Surgical History  Procedure Laterality Date  . Cardiac catheterization  11/07/2008    EF of 50-55% -- normal LV systolic function, acute inferolateral ST elevation MI,  PTCA of the circumflex artery -- [name]. [name], [name]  . Coronary artery bypass graft    . Left heart catheterization with coronary/graft angiogram N/A 05/15/2013    Procedure: LEFT HEART CATHETERIZATION WITH Beatrix Fetters;  Surgeon: [name], MD;  Location: [facility] CATH LAB;  Service: Cardiovascular;  Laterality: N/A;    Prior to Admission medications   Medication Sig Start Date End Date Taking? Authorizing Provider  acetaminophen (TYLENOL) 500 MG tablet Take 500-1,000 mg by mouth every 8 (eight) hours as needed for mild pain.    Yes Historical Provider, MD  aspirin 325 MG tablet Take 1 tablet (325 mg total) by mouth daily. 02/21/15  Yes Delfina Redwood, MD  b complex vitamins tablet Take 1 tablet by mouth  daily.   Yes Historical Provider, MD  citalopram (CELEXA) 20 MG tablet Take 1 tablet (20 mg total) by mouth daily. Patient taking differently: Take 20 mg by mouth at bedtime.  05/31/15  Yes Colon Branch, MD  HYDROcodone-acetaminophen (NORCO) 7.5-325 MG tablet Take 1 tablet by mouth 3 (three) times daily as needed for moderate pain. 07/30/15  Yes Colon Branch, MD  metoprolol tartrate (LOPRESSOR) 25 MG tablet Take 0.5 tablets (12.5 mg total) by mouth 2 (two) times daily. 03/13/15  Yes Burnell Blanks, MD  nitroGLYCERIN (NITROSTAT) 0.4 MG SL tablet Place 1 tablet (0.4 mg total) under the tongue every 5 (five) minutes as needed for chest pain. 03/13/15  Yes Burnell Blanks, MD  simvastatin (ZOCOR) 20 MG tablet Take 1 tablet (20 mg total) by mouth at bedtime. 03/13/15  Yes Burnell Blanks, MD  ferrous sulfate 325 (65 FE) MG tablet Take 325 mg by mouth daily with breakfast. Reported on 08/06/2015    Historical Provider, MD  Na Sulfate-K Sulfate-Mg Sulf SOLN Take 1 kit by mouth once. Patient not taking: Reported on 07/30/2015 07/16/15   Lori P Hvozdovic, PA-C  pantoprazole (PROTONIX) 40 MG tablet Take 1 tablet (40 mg total) by mouth 2 (two) times daily before a meal. Patient not taking: Reported on 08/06/2015 07/30/15   Colon Branch, MD    Scheduled Meds: . aspirin  325 mg Oral Daily  . B-complex with vitamin C  1 tablet Oral Daily  . citalopram  20 mg Oral QHS  . ferrous sulfate  325 mg Oral Q breakfast  . metoprolol tartrate  12.5 mg Oral BID  . nicotine  21 mg Transdermal Daily  . pantoprazole  40 mg Oral BID AC  . piperacillin-tazobactam (ZOSYN)  IV  3.375 g Intravenous 3 times per day  . simvastatin  20 mg Oral QHS  . sodium chloride  3 mL Intravenous Q12H  . sodium phosphate  1 enema Rectal Once   Infusions: . sodium chloride 125 mL/hr at 08/07/15 0224   PRN Meds: bisacodyl, HYDROcodone-acetaminophen, HYDROmorphone (DILAUDID) injection, nitroGLYCERIN, ondansetron   Allergies  as of 08/06/2015  . (No Known Allergies)    Family History  Problem Relation Age of Onset  . Heart disease Mother   . Heart failure Mother   . Diabetes Mother   . Prostate cancer Neg Hx   . Colon cancer Father     late 28s    Social History   Social History  . Marital Status: Single    Spouse Name:  N/A  . Number of Children: 2  . Years of Education: N/A   Occupational History  . not working , used to be a Chief Executive Officer     Social History Main Topics  . Smoking status: Current Every Day Smoker -- 1.00 packs/day for 30 years    Types: Cigarettes  . Smokeless tobacco: Never Used     Comment: 1 PPD  . Alcohol Use: 0.0 oz/week    0 Standard drinks or equivalent per week     Comment: 2 beers a day  . Drug Use: No     Comment: former users  . Sexual Activity: Not Currently   Other Topics Concern  . Not on file   Social History Narrative   Lives w/ girlfriend     REVIEW OF SYSTEMS: Constitutional:  No change in energy level ENT:  No nose bleeds Pulm:  Stable cough and DOE, 2 ppd smoker CV:  No palpitations, no LE edema. No chest pain GU:  No hematuria, no frequency GI:  Per HPI.  No dysphagia, no BPR Heme:  Longstanding anemia but iron only started this year, stopped taking it due to constipation   Transfusions:  never Neuro:  + unsteady gait and limb weakness since stroke.   Derm:  No itching, no rash or sores.  Endocrine:  No sweats or chills.  No polyuria or dysuria Immunization:  No records of any immunizations.  Did not inquire.  Travel:  None beyond local counties in last few months.    PHYSICAL EXAM: Vital signs in last 24 hours: Filed Vitals:   08/07/15 0216 08/07/15 0626  BP: 132/68 128/66  Pulse: 78 71  Temp: 97.9 F (36.6 C) 98.1 F (36.7 C)  Resp: 18 18   Wt Readings from Last 3 Encounters:  08/07/15 97.07 kg (214 lb)  07/30/15 96.673 kg (213 lb 2 oz)  07/16/15 95.255 kg (210 lb)    General: Patient is overweight and appears  chronically ill. Currently comfortable. Not toxic appearing Head:  No facial asymmetry or swelling. No signs of head trauma.  Eyes:  No scleral icterus, no conjunctival pallor. EOMI. Ears:  Slight decrease in hearing.  Nose:  No discharge or congestion Mouth:  Moist, clear, pink oral mucosa. Dentition in fair repair. Neck:  No JVD. No TMG, no masses. Lungs:  Diminished breath sounds, no adventitious sounds. No dyspnea.  somewhat mucoid cough. Heart: RRR. No MRG. S1/S2 audible. Well healed sternotomy scar. Abdomen:  Somewhat protuberant and tight. Tender without guarding or rebound in the right abdomen and epigastric area. No masses, no hepato-splenomegaly..   Rectal: Deferred.   Musc/Skeltl: No muscle wasting no contracture deformities. Extremities:  No CCE.  Neurologic:  Patient is alert. He is oriented 3. He has a good historian. There is no tremor. He moves all 4 limbs, limb strength was not tested. Skin:  No rashes, sores or suspicious lesions. Tattoos:  Multiple, on the arms. Nodes:  No cervical adenopathy.   Psych:  Pleasant, calm. Affect slightly depressed. Engaged and cooperative.  Intake/Output from previous day: 12/27 0701 - 12/28 0700 In: 1000 [I.V.:1000] Out: -  Intake/Output this shift: Total I/O In: 0  Out: 250 [Urine:250]  LAB RESULTS:  Recent Labs  08/06/15 2034 08/07/15 0206  WBC 11.9* 10.6*  HGB 10.4* 9.1*  HCT 33.9* 30.4*  PLT 411* 339   BMET Lab Results  Component Value Date   NA 138 08/07/2015   NA 138 08/06/2015   NA 136 06/16/2015  K 3.7 08/07/2015   K 3.9 08/06/2015   K 3.5 06/16/2015   CL 104 08/07/2015   CL 102 08/06/2015   CL 94* 06/16/2015   CO2 23 08/07/2015   CO2 24 08/06/2015   CO2 27 06/16/2015   GLUCOSE 146* 08/07/2015   GLUCOSE 109* 08/06/2015   GLUCOSE 125* 06/16/2015   BUN 10 08/07/2015   BUN 13 08/06/2015   BUN 7 06/16/2015   CREATININE 0.75 08/07/2015   CREATININE 0.93 08/06/2015   CREATININE 0.85 06/16/2015    CALCIUM 7.9* 08/07/2015   CALCIUM 8.7* 08/06/2015   CALCIUM 9.9 06/16/2015   LFT  Recent Labs  08/06/15 2034 08/07/15 0206  PROT 7.6 6.6  ALBUMIN 3.1* 2.6*  AST 14* 12*  ALT 10* 10*  ALKPHOS 69 61  BILITOT 0.5 0.7   PT/INR Lab Results  Component Value Date   INR 1.25 08/07/2015   INR 1.1* 05/12/2013   INR 1.3 11/07/2008   Hepatitis Panel No results for input(s): HEPBSAG, HCVAB, HEPAIGM, HEPBIGM in the last 72 hours. C-Diff No components found for: CDIFF Lipase     Component Value Date/Time   LIPASE 70* 08/06/2015 2034    Drugs of Abuse     Component Value Date/Time   LABOPIA POSITIVE* 08/07/2015 0833   COCAINSCRNUR NONE DETECTED 08/07/2015 0833   LABBENZ NONE DETECTED 08/07/2015 0833   AMPHETMU NONE DETECTED 08/07/2015 0833   THCU POSITIVE* 08/07/2015 0833   LABBARB NONE DETECTED 08/07/2015 0833     RADIOLOGY STUDIES: Ct Abdomen Pelvis W Contrast  08/06/2015  CLINICAL DATA:  54 year old male EXAM: CT ABDOMEN AND PELVIS WITH CONTRAST TECHNIQUE: Multidetector CT imaging of the abdomen and pelvis was performed using the standard protocol following bolus administration of intravenous contrast. CONTRAST:  140m OMNIPAQUE IOHEXOL 300 MG/ML  SOLN COMPARISON:  CT dated 06/16/2015 FINDINGS: Minimal bibasilar dependent atelectatic changes. Mild cardiomegaly. There is coronary vascular calcification. No intra-abdominal free air.  No free fluid. There is inflammatory changes of the fat adjacent to the head of the pancreas. There is a fluid collection with enhancing rim anterior to the uncinate process of the pancreas measuring 2.4 x 3.7 cm in axial dimensions and 2.8 cm in craniocaudal length compatible with a pseudocyst. Is there is diffuse inflammatory changes and stranding of the mesentery in the right upper abdomen and subhepatic region. There is a 3.0 x 1.1 cm fluid collection in the falciform ligament compatible with a pseudocyst/abscess. There is mild inflammatory changes  of the fat surrounding the gallbladder fundus, likely extension of inflammation of the pancreas. The gallbladder is otherwise unremarkable. The liver, spleen, and adrenal glands appear unremarkable. There is no hydronephrosis. Bilateral renal vascular calcifications noted. The visualized ureters appear unremarkable. The urinary bladder is only partially distended. There is apparent diffuse thickening of the bladder wall which may be partly related to underdistention. Cystitis is not excluded. Correlation with urinalysis recommended. The prostate gland is grossly unremarkable. There is dense stool within the colon as well as rectum compatible with constipation with possible fecal impaction within the rectum. There is a moderate size sliding hiatal hernia. No evidence of bowel obstruction or inflammation. Normal appendix. There is aortoiliac atherosclerotic disease. There is a 2.7 cm infrarenal abdominal aortic ectasia. The IVC appear patent. No portal venous gas identified. Top-normal pancreaticocaval and portocaval lymph nodes noted. No significant adenopathy. Small fat containing umbilical hernia. The osseous structures appear unremarkable. IMPRESSION: Diffuse inflammatory changes of the right upper abdomen and subhepatic area with infected appearing collection  at the head of the pancreas and within the falciform ligament compatible with pseudocysts or abscesses. No evidence of bowel obstruction or inflammation.  Normal appendix. Electronically Signed   By: Anner Crete [name]   On: 08/06/2015 23:08    ENDOSCOPIC STUDIES: None ever  IMPRESSION:   *  Hx acute pancreatitis first presenting in early 06/2015.  Cause suspected to be ETOH.  No evidence of gallstones or CBD stone or obstruction.  No hypertriglyceridemia.  Ongoing, worsening abd pain with new fluid collection.  CT raises question of fluid infection but surgeon does not feel this is infected.   He has been overusing his RXd narcotics at home,  despite dose increase.   *  Longstanding hx anemia.  Started po iron 03/2015 for IDA. On po B 12 for last couple of years. Recently FOBT + but no overt bleeding.  Family hx colon cancer.  Set for colon/EGD on 09/13/15. Does not tolerate po Iron so not taking.  On empiric PPI.   *  Fatty liver by 11/6 ultrasound.   *  CAD with previous CABG and stents.    *  Hx CVAs in 2008 and 2016. Only on full dose ASA, no Plavix etc   PLAN:     *  ? MRCP to further image pancreas?  Or wonder if best to wait for pancreatic fluid collection to resolve.   *  Agree with conservative care.  Can have po as tolerated.  Since solids at home are not increasing sxs, have started heart healthy diet.   *  Need to use caution with RX of narcotics given his previous hx substance abuse, current use of marijuana and current overuse of Rxd narcotics. Wonder if a switch to some other form of narcotic might control his pain better?   If we can get the pain under better control, he could discharge home.  *  Complete alcohol abstinence reiterated.  *  Keep all pending appointments with preop nurse visit as well as colon/EGD.   Azucena Freed  08/07/2015, 11:26 AM Pager: [phone]

## 2015-08-07 NOTE — H&P (Addendum)
[facility] History and Physical  [name]. GQB:[id number] DOB: 1961-04-19 DOA: 08/06/2015  Referring physician: ED physician PCP: Kenneth November, MD  Specialists:   Chief Complaint: Abdominal pain  HPI: [name]. is a 54 y.o. male with PMH of pancreatitis, hypertension, hyperlipidemia, GERD, depression, CAD, S/P of CABG, tobacco abuse, alcohol abuse, stroke, cocaine abuse, who presents with abdominal pain.  Patient reports that he was recently diagnosed as pancreatitis in [name], with lipase elevated up to 153. He has mild abdominal pain at baseline, which has worsened today. His abdominal pain is located in the right upper quadrant, constant, 10 out of 10 in severity, sharp, nonradiating. It is not associated with nausea vomiting or diarrhea. No fever, chills. He had transient oxygen desaturation to 83% in emergency room, which quickly improved to normal when placed on 2L [place]. Patient denies chest pain or shortness of breath. No cough, symptoms of UTI or unilateral weakness. Patient strongly denies alcohol abuse, states that he drinks 1 or 2 beers occasionally, last drinking was 2 weeks ago.  In ED, patient was found to have lipase 70, urinalysis with small amount of leukocyte, negative troponin, WBC 11.7, temperature normal, no tachycardia, electrolytes and renal function okay. CT abdomen/pelvis showed diffuse inflammatory changes of the right upper abdomen and subhepatic area with infected appearing collection at the head of the pancreas and within the falciform ligament compatible with pseudocysts or abscesses. Patient is admitted to inpatient for further eval and treatment. General surgery was consulted by EDP. Dr. [name] will see pt in AM.  Where does patient live?   At home    Can patient participate in ADLs?  Yes   Review of Systems:   General: no fevers, chills, no changes in body weight, has poor appetite, has fatigue HEENT: no blurry vision, hearing changes or  sore throat Pulm: no dyspnea, coughing, wheezing CV: no chest pain, palpitations Abd: no nausea, vomiting, has abdominal pain, no diarrhea, constipation GU: no dysuria, burning on urination, increased urinary frequency, hematuria  Ext: no leg edema Neuro: no unilateral weakness, numbness, or tingling, no vision change or hearing loss Skin: no rash MSK: No muscle spasm, no deformity, no limitation of range of movement in spin Heme: No easy bruising.  Travel history: No recent long distant travel.  Allergy: No Known Allergies  Past Medical History  Diagnosis Date  . Hypertension   . History of tobacco abuse     still smokes  . Myocardial infarction [facility]) 12/07/2000    STEMI March 2010, angioplasty of Circumflex and emergent CABG November 07, 2008, bare metal stent to SVG to Circumflex with bare metal stent  [name] 2010.   [name] Chest pain   . Coronary artery disease   . Cerebrovascular disease ~2008 and 02-2015  . Abnormality of gait   . Cocaine use     denies current use   . CHF (congestive heart failure) ([place])   . Pancreatitis     Past Surgical History  Procedure Laterality Date  . Cardiac catheterization  11/07/2008    EF of 50-55% -- normal LV systolic function, acute inferolateral ST elevation MI,  PTCA of the circumflex artery -- [name]. [name], [name]  . Coronary artery bypass graft    . Left heart catheterization with coronary/graft angiogram N/A 05/15/2013    Procedure: LEFT HEART CATHETERIZATION WITH Beatrix Fetters;  Surgeon: [name], MD;  Location: [facility] CATH LAB;  Service: Cardiovascular;  Laterality: N/A;    Social  History:  reports that he has been smoking Cigarettes.  He has a 30 pack-year smoking history. He has never used smokeless tobacco. He reports that he drinks alcohol. He reports that he does not use illicit drugs.  Family History:  Family History  Problem Relation Age of Onset  . Heart disease Mother   . Heart failure Mother   . Diabetes  Mother   . Prostate cancer Neg Hx   . Colon cancer Father     late 50s     Prior to Admission medications   Medication Sig Start Date End Date Taking? Authorizing Provider  acetaminophen (TYLENOL) 500 MG tablet Take 500-1,000 mg by mouth every 8 (eight) hours as needed for mild pain.    Yes Historical Provider, MD  aspirin 325 MG tablet Take 1 tablet (325 mg total) by mouth daily. 02/21/15  Yes Delfina Redwood, MD  b complex vitamins tablet Take 1 tablet by mouth daily.   Yes Historical Provider, MD  citalopram (CELEXA) 20 MG tablet Take 1 tablet (20 mg total) by mouth daily. Patient taking differently: Take 20 mg by mouth at bedtime.  05/31/15  Yes Colon Branch, MD  HYDROcodone-acetaminophen (NORCO) 7.5-325 MG tablet Take 1 tablet by mouth 3 (three) times daily as needed for moderate pain. 07/30/15  Yes Colon Branch, MD  metoprolol tartrate (LOPRESSOR) 25 MG tablet Take 0.5 tablets (12.5 mg total) by mouth 2 (two) times daily. 03/13/15  Yes Burnell Blanks, MD  nitroGLYCERIN (NITROSTAT) 0.4 MG SL tablet Place 1 tablet (0.4 mg total) under the tongue every 5 (five) minutes as needed for chest pain. 03/13/15  Yes Burnell Blanks, MD  simvastatin (ZOCOR) 20 MG tablet Take 1 tablet (20 mg total) by mouth at bedtime. 03/13/15  Yes Burnell Blanks, MD  ferrous sulfate 325 (65 FE) MG tablet Take 325 mg by mouth daily with breakfast. Reported on 08/06/2015    Historical Provider, MD  Na Sulfate-K Sulfate-Mg Sulf SOLN Take 1 kit by mouth once. Patient not taking: Reported on 07/30/2015 07/16/15   Cecille Rubin P Hvozdovic, PA-C  pantoprazole (PROTONIX) 40 MG tablet Take 1 tablet (40 mg total) by mouth 2 (two) times daily before a meal. Patient not taking: Reported on 08/06/2015 07/30/15   Colon Branch, MD    Physical Exam: Filed Vitals:   08/07/15 0100 08/07/15 0130 08/07/15 0142 08/07/15 0216  BP: 143/85 128/75  132/68  Pulse: 81 79  78  Temp:   98 F (36.7 C) 97.9 F (36.6 C)  TempSrc:    Oral Oral  Resp:    18  Height:    '5\' 11"'  (1.803 m)  Weight:    97.07 kg (214 lb)  SpO2: 92% 95%  96%   General: Not in acute distress HEENT:       Eyes: PERRL, EOMI, no scleral icterus.       ENT: No discharge from the ears and nose, no pharynx injection, no tonsillar enlargement.        Neck: No JVD, no bruit, no mass felt. Heme: No neck lymph node enlargement. Cardiac: S1/S2, RRR, No murmurs, No gallops or rubs. Pulm: No rales, wheezing, rhonchi or rubs. Abd: Soft, nondistended, has severe tender over RUQ, no rebound pain, no organomegaly, BS present. Ext: No pitting leg edema bilaterally. 2+DP/PT pulse bilaterally. Musculoskeletal: No joint deformities, No joint redness or warmth, no limitation of ROM in spin. Skin: No rashes.  Neuro: Alert, oriented X3, cranial nerves II-XII  grossly intact, muscle strength 5/5 in all extremities, sensation to light touch intact.  Psych: Patient is not psychotic, no suicidal or hemocidal ideation.  Labs on Admission:  Basic Metabolic Panel:  Recent Labs Lab 08/06/15 2034 08/07/15 0206  NA 138 138  K 3.9 3.7  CL 102 104  CO2 24 23  GLUCOSE 109* 146*  BUN 13 10  CREATININE 0.93 0.75  CALCIUM 8.7* 7.9*   Liver Function Tests:  Recent Labs Lab 08/06/15 2034 08/07/15 0206  AST 14* 12*  ALT 10* 10*  ALKPHOS 69 61  BILITOT 0.5 0.7  PROT 7.6 6.6  ALBUMIN 3.1* 2.6*    Recent Labs Lab 08/06/15 2034  LIPASE 70*   No results for input(s): AMMONIA in the last 168 hours. CBC:  Recent Labs Lab 08/06/15 2034 08/07/15 0206  WBC 11.9* 10.6*  NEUTROABS 9.2*  --   HGB 10.4* 9.1*  HCT 33.9* 30.4*  MCV 77.4* 77.4*  PLT 411* 339   Cardiac Enzymes:  Recent Labs Lab 08/06/15 2034  TROPONINI 0.03    BNP (last 3 results) No results for input(s): BNP in the last 8760 hours.  ProBNP (last 3 results) No results for input(s): PROBNP in the last 8760 hours.  CBG: No results for input(s): GLUCAP in the last 168  hours.  Radiological Exams on Admission: Ct Abdomen Pelvis W Contrast  08/06/2015  CLINICAL DATA:  54 year old male EXAM: CT ABDOMEN AND PELVIS WITH CONTRAST TECHNIQUE: Multidetector CT imaging of the abdomen and pelvis was performed using the standard protocol following bolus administration of intravenous contrast. CONTRAST:  170m OMNIPAQUE IOHEXOL 300 MG/ML  SOLN COMPARISON:  CT dated 06/16/2015 FINDINGS: Minimal bibasilar dependent atelectatic changes. Mild cardiomegaly. There is coronary vascular calcification. No intra-abdominal free air.  No free fluid. There is inflammatory changes of the fat adjacent to the head of the pancreas. There is a fluid collection with enhancing rim anterior to the uncinate process of the pancreas measuring 2.4 x 3.7 cm in axial dimensions and 2.8 cm in craniocaudal length compatible with a pseudocyst. Is there is diffuse inflammatory changes and stranding of the mesentery in the right upper abdomen and subhepatic region. There is a 3.0 x 1.1 cm fluid collection in the falciform ligament compatible with a pseudocyst/abscess. There is mild inflammatory changes of the fat surrounding the gallbladder fundus, likely extension of inflammation of the pancreas. The gallbladder is otherwise unremarkable. The liver, spleen, and adrenal glands appear unremarkable. There is no hydronephrosis. Bilateral renal vascular calcifications noted. The visualized ureters appear unremarkable. The urinary bladder is only partially distended. There is apparent diffuse thickening of the bladder wall which may be partly related to underdistention. Cystitis is not excluded. Correlation with urinalysis recommended. The prostate gland is grossly unremarkable. There is dense stool within the colon as well as rectum compatible with constipation with possible fecal impaction within the rectum. There is a moderate size sliding hiatal hernia. No evidence of bowel obstruction or inflammation. Normal appendix.  There is aortoiliac atherosclerotic disease. There is a 2.7 cm infrarenal abdominal aortic ectasia. The IVC appear patent. No portal venous gas identified. Top-normal pancreaticocaval and portocaval lymph nodes noted. No significant adenopathy. Small fat containing umbilical hernia. The osseous structures appear unremarkable. IMPRESSION: Diffuse inflammatory changes of the right upper abdomen and subhepatic area with infected appearing collection at the head of the pancreas and within the falciform ligament compatible with pseudocysts or abscesses. No evidence of bowel obstruction or inflammation.  Normal appendix. Electronically Signed  By: Anner Crete [name]   On: 08/06/2015 23:08    EKG: Independently reviewed. QTC 459, nonspecific T-wave change   Assessment/Plan Principal Problem:   Abdominal pain Active Problems:   Dyslipidemia   TOBACCO ABUSE   HYPERTENSION, BENIGN ESSENTIAL   CAD, stents, subsequent CABG   Depression   Stroke ~ 2008, 02-2015   Alcohol abuse   Hx of cocaine abuse   Pancreatitis   GERD (gastroesophageal reflux disease)  Abdominal pain due to pancreatitis: Lipase is mildly elevated. CT abdomen/pelvis showed infected appearing collection at the head of the pancreas and within the falciform ligament compatible with pseudocysts or abscesses. Patient is not septic on admission was hemodynamically stable.  -will admit to tele bed -Follow-up general surgeons recommendation -NPO now -INR/PTT/type & screen -Pain control: When necessary Dilaudid and Norco -When necessary Zofran for nausea -IVF: NS 2 L, followed by 125 mL per hour. -IV zosyn -f/u blood culture x 2  HLD: Last LDL was 81 on 05/31/15 -Continue home medications: Zocor  Tobacco abuse and Alcohol abuse: strongly denies alcohol abuse. No signs of alcohol withdrawal -Did counseling about importance of quitting smoking -Nicotine patch -Did counseling about the importance of quitting drinking  HTN: - On  metoprolol  CAD: s/p of stents, subsequent CABG: no CP. -ASA, metoprolol and Zocor  Depression: Stable, no suicidal or homicidal ideations. -Continue home medications: Celexa  Hx of stroke: no acute issues. -On ASA  GERD: -Protonix  DVT ppx: SCD Code Status: Full code Family Communication: None at bed side.    Disposition Plan: Admit to inpatient   Date of Service 08/07/2015    Ivor Costa [facility] Pager [phone]  If 7PM-7AM, please contact night-coverage [email or web address] Password [email or web address] 08/07/2015, 5:29 AM

## 2015-08-08 DIAGNOSIS — K219 Gastro-esophageal reflux disease without esophagitis: Secondary | ICD-10-CM

## 2015-08-08 DIAGNOSIS — I25810 Atherosclerosis of coronary artery bypass graft(s) without angina pectoris: Secondary | ICD-10-CM

## 2015-08-08 DIAGNOSIS — F329 Major depressive disorder, single episode, unspecified: Secondary | ICD-10-CM

## 2015-08-08 DIAGNOSIS — F1010 Alcohol abuse, uncomplicated: Secondary | ICD-10-CM

## 2015-08-08 LAB — COMPREHENSIVE METABOLIC PANEL
ALBUMIN: 2.5 g/dL — AB (ref 3.5–5.0)
ALK PHOS: 55 U/L (ref 38–126)
ALT: 9 U/L — AB (ref 17–63)
AST: 11 U/L — AB (ref 15–41)
Anion gap: 7 (ref 5–15)
BUN: 9 mg/dL (ref 6–20)
CALCIUM: 8.2 mg/dL — AB (ref 8.9–10.3)
CHLORIDE: 106 mmol/L (ref 101–111)
CO2: 23 mmol/L (ref 22–32)
CREATININE: 0.66 mg/dL (ref 0.61–1.24)
GFR calc Af Amer: >60 mL/min (ref >60)
GFR calc non Af Amer: >60 mL/min (ref >60)
GLUCOSE: 95 mg/dL (ref 65–99)
Potassium: 3.8 mmol/L (ref 3.5–5.1)
SODIUM: 136 mmol/L (ref 135–145)
Total Bilirubin: 0.4 mg/dL (ref 0.3–1.2)
Total Protein: 6.6 g/dL (ref 6.5–8.1)

## 2015-08-08 LAB — GLUCOSE, CAPILLARY: Glucose-Capillary: 86 mg/dL (ref 65–99)

## 2015-08-08 LAB — URINE CULTURE: CULTURE: NO GROWTH

## 2015-08-08 NOTE — Progress Notes (Addendum)
Progress Note   Subjective  Patient reports ongoing epigastric pain overnight. He has been NPO since yesterday. No significant improvement in pain when he is not on pain medications. The pain medications do help. No fevers.   Objective   Vital signs in last 24 hours: Temp:  [97.6 F (36.4 C)-98.2 F (36.8 C)] 98 F (36.7 C) (12/29 0459) Pulse Rate:  [64-75] 67 (12/29 0459) Resp:  [16-18] 18 (12/29 0459) BP: (126-144)/(65-77) 144/67 mmHg (12/29 0459) SpO2:  [93 %-94 %] 94 % (12/29 0459) Last BM Date: 08/07/15 General:    White male in NAD Heart:  Regular rate and rhythm; no murmurs Lungs: Respirations even and unlabored, lungs CTA bilaterally Abdomen:  Soft, epigastric to RUQ TTP, no rebound or guarding, protuberant, periumbilical hernia, Normal bowel sounds. Extremities:  Without edema. Neurologic:  Alert and oriented,  grossly normal neurologically. Psych:  Cooperative. Normal mood and affect.  Intake/Output from previous day: 12/28 0701 - 12/29 0700 In: 240 [P.O.:240] Out: 1375 [Urine:1375] Intake/Output this shift:    Lab Results:  Recent Labs  08/06/15 2034 08/07/15 0206  WBC 11.9* 10.6*  HGB 10.4* 9.1*  HCT 33.9* 30.4*  PLT 411* 339   BMET  Recent Labs  08/06/15 2034 08/07/15 0206 08/08/15 0545  NA 138 138 136  K 3.9 3.7 3.8  CL 102 104 106  CO2 24 23 23   GLUCOSE 109* 146* 95  BUN 13 10 9   CREATININE 0.93 0.75 0.66  CALCIUM 8.7* 7.9* 8.2*   LFT  Recent Labs  08/08/15 0545  PROT 6.6  ALBUMIN 2.5*  AST 11*  ALT 9*  ALKPHOS 55  BILITOT 0.4   PT/INR  Recent Labs  08/07/15 0207  LABPROT 15.9*  INR 1.25    Studies/Results: Ct Abdomen Pelvis W Contrast  08/06/2015  CLINICAL DATA:  54 year old male EXAM: CT ABDOMEN AND PELVIS WITH CONTRAST TECHNIQUE: Multidetector CT imaging of the abdomen and pelvis was performed using the standard protocol following bolus administration of intravenous contrast. CONTRAST:  128mL OMNIPAQUE  IOHEXOL 300 MG/ML  SOLN COMPARISON:  CT dated 06/16/2015 FINDINGS: Minimal bibasilar dependent atelectatic changes. Mild cardiomegaly. There is coronary vascular calcification. No intra-abdominal free air.  No free fluid. There is inflammatory changes of the fat adjacent to the head of the pancreas. There is a fluid collection with enhancing rim anterior to the uncinate process of the pancreas measuring 2.4 x 3.7 cm in axial dimensions and 2.8 cm in craniocaudal length compatible with a pseudocyst. Is there is diffuse inflammatory changes and stranding of the mesentery in the right upper abdomen and subhepatic region. There is a 3.0 x 1.1 cm fluid collection in the falciform ligament compatible with a pseudocyst/abscess. There is mild inflammatory changes of the fat surrounding the gallbladder fundus, likely extension of inflammation of the pancreas. The gallbladder is otherwise unremarkable. The liver, spleen, and adrenal glands appear unremarkable. There is no hydronephrosis. Bilateral renal vascular calcifications noted. The visualized ureters appear unremarkable. The urinary bladder is only partially distended. There is apparent diffuse thickening of the bladder wall which may be partly related to underdistention. Cystitis is not excluded. Correlation with urinalysis recommended. The prostate gland is grossly unremarkable. There is dense stool within the colon as well as rectum compatible with constipation with possible fecal impaction within the rectum. There is a moderate size sliding hiatal hernia. No evidence of bowel obstruction or inflammation. Normal appendix. There is aortoiliac atherosclerotic disease. There is a 2.7 cm infrarenal  abdominal aortic ectasia. The IVC appear patent. No portal venous gas identified. Top-normal pancreaticocaval and portocaval lymph nodes noted. No significant adenopathy. Small fat containing umbilical hernia. The osseous structures appear unremarkable. IMPRESSION: Diffuse  inflammatory changes of the right upper abdomen and subhepatic area with infected appearing collection at the head of the pancreas and within the falciform ligament compatible with pseudocysts or abscesses. No evidence of bowel obstruction or inflammation.  Normal appendix. Electronically Signed   By: Anner Crete [name]   On: 08/06/2015 23:08       Assessment / Plan:   54 y/o male with an episode of suspected alcohol related acute pancreatitis on November 6th, who initially got better on liquid diet at home, but 2 weeks after that diagnosis has developed recurrence of abdominal pain and has intervally gotten worse in recent days. He has had persistent pain now for the past 4-5 weeks. CT shows peripancreatic fluid collections. He has no significant leukocytosis and would agree these do not seem infected and that they are not large, however they could be driving his symptoms which have worsened recently.Generally, if asymptomatic the cysts are not drained; however, given in his situation with his history of pancreatitis diagnosed in early November, with his ongoing symptoms, he could be considered for a transpapillary stent or drainage via EUS. No pseudoaneurysm noted on CT. Given his symptoms persist I will discuss the utility of transpapillary stent or drainage via EUS with a tertiary care hospital today [facility] or [facility]) to see if he is a candidate for this, as these interventions are not available at our hospital.   In regards to the etiology of the pancreatitis, suspect most likely it is alcohol related based on the history provided. No other new medications or OTCs which would cause this. He has no prior history of pancreatitis or strong FH of pancreatic disease. No obvious mass lesions on imaging. No gallstones.   He otherwise has an ongoing iron deficiency anemia for which he is in need of EGD and colonoscopy, and this is pending. Will address this acute issue first as he could not tolerate bowel  prep at this time.   Osprey Cellar, MD [facility] Pager [phone]   Principal Problem:   Abdominal pain Active Problems:   Dyslipidemia   TOBACCO ABUSE   HYPERTENSION, BENIGN ESSENTIAL   CAD, stents, subsequent CABG   Depression   Stroke ~ 2008, 02-2015   Alcohol abuse   Hx of cocaine abuse   Pancreatitis   GERD (gastroesophageal reflux disease)   Pancreatitis, acute   Abnormal CT scan, pancreas or bile duct     LOS: 1 day   Renelda Loma Armbruster  08/08/2015, 8:13 AM    Addendum: I spoke with Dr. [name] of GI at [facility] about this case and reviewed the CT again with radiology. There is no pancreatic ductal dilation on CT. Dr. [name] offered an EUS to further evaluate the pancreas and potentially drain the fluid collection, and also would consider transpapillary stent if the PD was dilated, however due to staffing issues would be difficult to get this done prior to the weekend if the patient is stable, and it is not clear cut if the patient would benefit from this intervention as the fluid collection is small. Further, given the PD is not dilated on imaging, he does not think transpapillary stent would be indicated, unless it showed otherwise on EUS. We will manage conservatively at this time with bowel rest and pain control.  If his symptoms improve over the weekend and he can be transitioned to oral pain medications and resume a diet, he may be able to be discharged. If he cannot transition to oral pain medications and continues to have severe pain, will consider EUS for drainage after the weekend. Please call with questions.   Saluda Cellar, MD [facility] Pager [phone]

## 2015-08-08 NOTE — Progress Notes (Signed)
[facility]                                                                              Patient Demographics  [name], is a 54 y.o. male, DOB - 1961/04/21, [id number]  Admit date - 08/06/2015   Admitting Physician Ivor Costa, MD  Outpatient Primary MD for the patient is [name], MD  LOS - 1   Chief Complaint  Patient presents with  . Abdominal Pain      HPI on 08/07/2015 by Dr. [name]. is a 54 y.o. male with PMH of pancreatitis, hypertension, hyperlipidemia, GERD, depression, CAD, S/P of CABG, tobacco abuse, alcohol abuse, stroke, cocaine abuse, who presents with abdominal pain.  Patient reports that he was recently diagnosed as pancreatitis in November, with lipase elevated up to 153. He has mild abdominal pain at baseline, which has worsened today. His abdominal pain is located in the right upper quadrant, constant, 10 out of 10 in severity, sharp, nonradiating. It is not associated with nausea vomiting or diarrhea. No fever, chills. He had transient oxygen desaturation to 83% in emergency room, which quickly improved to normal when placed on 2L [place]. Patient denies chest pain or shortness of breath. No cough, symptoms of UTI or unilateral weakness. Patient strongly denies alcohol abuse, states that he drinks 1 or 2 beers occasionally, last drinking was 2 weeks ago.  In ED, patient was found to have lipase 70, urinalysis with small amount of leukocyte, negative troponin, WBC 11.7, temperature normal, no tachycardia, electrolytes and renal function okay. CT abdomen/pelvis showed diffuse inflammatory changes of the right upper abdomen and subhepatic area with infected appearing collection at the head of the pancreas and within the falciform ligament compatible with pseudocysts or abscesses. Patient is admitted to inpatient for further eval and treatment. General surgery was consulted by EDP. Dr. [name] will see pt in AM.  Assessment & Plan   Acute  abdominal pain secondary to acute pancreatitis -CT of the abdomen and pelvis -Lipase of 70 upon admission -Patient has had pancreatitis in the past -Currently NPO -Continue conservative management with IVF, pain control -Gastroneurology consulted and appreciated, Dr. [name], spoke with radiologist, who collections appear to be walled off, likely inflammatory process continue medical management.  Dr. [name] also spoke with [name], Dr. [name] (GI), were freed CT scan with radiology. No pancreatic ductal dilatation noted. Patient may benefit from a GU asked to evaluate the pancreas and potentially drain the fluid collection however this would not be done until next week. -Leukocytosis improving, currently on Zosyn -Spoke with Dr. [name], antibiotics only if fever/leukocytosis  Hypertension -Continue metoprolol  Coronary artery disease -s/p stents/CABG -Currently chest pain-free, continue aspirin, metoprolol, statin  ? Alcohol abuse/ tobacco use -Patient denies alcohol use -Continue nicotine patch, discussed smoking cessation  Hyperlipidemia -Continue statin  Depression -Continue Celexa  History of CVA -Continue aspirin  GERD -Continue PPI  Code Status: Full  Family Communication: None at bedside  Disposition Plan: Admitted  Time Spent in minutes   30 minutes  Procedures  None  Consults   Gastroenterology  DVT Prophylaxis  SCDs  Lab Results  Component Value Date  PLT 339 08/07/2015    Medications  Scheduled Meds: . aspirin  325 mg Oral Daily  . B-complex with vitamin C  1 tablet Oral Daily  . citalopram  20 mg Oral QHS  . ferrous sulfate  325 mg Oral Q breakfast  . metoprolol tartrate  12.5 mg Oral BID  . nicotine  21 mg Transdermal Daily  . pantoprazole  40 mg Oral BID AC  . piperacillin-tazobactam (ZOSYN)  IV  3.375 g Intravenous 3 times per day  . simvastatin  20 mg Oral QHS  . sodium chloride  3 mL Intravenous Q12H  . sodium phosphate  1  enema Rectal Once   Continuous Infusions: . sodium chloride 125 mL/hr at 08/08/15 0624   PRN Meds:.bisacodyl, HYDROcodone-acetaminophen, HYDROmorphone (DILAUDID) injection, nitroGLYCERIN, ondansetron  Antibiotics    Anti-infectives    Start     Dose/Rate Route Frequency Ordered Stop   08/07/15 0600  piperacillin-tazobactam (ZOSYN) IVPB 3.375 g     3.375 g 12.5 mL/hr over 240 Minutes Intravenous 3 times per day 08/07/15 0223     08/07/15 0130  piperacillin-tazobactam (ZOSYN) IVPB 3.375 g  Status:  Discontinued     3.375 g 100 mL/hr over 30 Minutes Intravenous 3 times per day 08/07/15 0119 08/07/15 0223   08/07/15 0000  piperacillin-tazobactam (ZOSYN) IVPB 3.375 g     3.375 g 100 mL/hr over 30 Minutes Intravenous  Once 08/06/15 2356 08/07/15 0104      Subjective:   [name] seen and examined today.  Patient still complains of abdominal pain, denies nausea or vomiting at this time.  He is confused about the treatment plan that was discussed with the GI physician.  Denies chest pain, shortness of breath, dizziness, headache.  Objective:   Filed Vitals:   08/07/15 1431 08/07/15 2100 08/07/15 2128 08/08/15 0459  BP: 126/65 127/72 127/77 144/67  Pulse: 69 75 64 67  Temp: 97.6 F (36.4 C)  98.2 F (36.8 C) 98 F (36.7 C)  TempSrc: Oral  Oral Oral  Resp: 18  16 18   Height:      Weight:      SpO2: 93%  94% 94%    Wt Readings from Last 3 Encounters:  08/07/15 97.07 kg (214 lb)  07/30/15 96.673 kg (213 lb 2 oz)  07/16/15 95.255 kg (210 lb)     Intake/Output Summary (Last 24 hours) at 08/08/15 1345 Last data filed at 08/08/15 0500  Gross per 24 hour  Intake    240 ml  Output   1125 ml  Net   -885 ml    Exam  General: Well developed, well nourished, NAD, appears stated age  HEENT: NCAT, PERRLA, EOMI, Anicteic Sclera, mucous membranes moist.   Neck: Supple, no JVD, no masses  Cardiovascular: S1 S2 auscultated, no rubs, murmurs or gallops. Regular rate and  rhythm.  Respiratory: Clear to auscultation bilaterally with equal chest rise  Abdomen: Soft, RUQ/epigastic TTP, nondistended, + bowel sounds  Extremities: warm dry without cyanosis clubbing or edema  Neuro: AAOx3, cranial nerves grossly intact. Strength 5/5 in patient's upper and lower extremities bilaterally  Skin: Without rashes exudates or nodules  Psych: Normal affect and demeanor with intact judgement and insight    Data Review   Micro Results Recent Results (from the past 240 hour(s))  Urine culture     Status: None   Collection Time: 08/06/15  8:29 PM  Result Value Ref Range Status   Specimen Description URINE, RANDOM  Final  Special Requests NONE  Final   Culture NO GROWTH 1 DAY  Final   Report Status 08/08/2015 FINAL  Final  Culture, blood (Routine X 2) w Reflex to ID Panel     Status: None (Preliminary result)   Collection Time: 08/07/15  1:40 AM  Result Value Ref Range Status   Specimen Description BLOOD LEFT HAND  Final   Special Requests BOTTLES DRAWN AEROBIC AND ANAEROBIC 5CC  Final   Culture NO GROWTH 1 DAY  Final   Report Status PENDING  Incomplete  Culture, blood (Routine X 2) w Reflex to ID Panel     Status: None (Preliminary result)   Collection Time: 08/07/15  1:45 AM  Result Value Ref Range Status   Specimen Description BLOOD LEFT ANTECUBITAL  Final   Special Requests BOTTLES DRAWN AEROBIC AND ANAEROBIC 5ML  Final   Culture NO GROWTH 1 DAY  Final   Report Status PENDING  Incomplete    Radiology Reports Ct Abdomen Pelvis W Contrast  08/06/2015  CLINICAL DATA:  54 year old male EXAM: CT ABDOMEN AND PELVIS WITH CONTRAST TECHNIQUE: Multidetector CT imaging of the abdomen and pelvis was performed using the standard protocol following bolus administration of intravenous contrast. CONTRAST:  143mL OMNIPAQUE IOHEXOL 300 MG/ML  SOLN COMPARISON:  CT dated 06/16/2015 FINDINGS: Minimal bibasilar dependent atelectatic changes. Mild cardiomegaly. There is  coronary vascular calcification. No intra-abdominal free air.  No free fluid. There is inflammatory changes of the fat adjacent to the head of the pancreas. There is a fluid collection with enhancing rim anterior to the uncinate process of the pancreas measuring 2.4 x 3.7 cm in axial dimensions and 2.8 cm in craniocaudal length compatible with a pseudocyst. Is there is diffuse inflammatory changes and stranding of the mesentery in the right upper abdomen and subhepatic region. There is a 3.0 x 1.1 cm fluid collection in the falciform ligament compatible with a pseudocyst/abscess. There is mild inflammatory changes of the fat surrounding the gallbladder fundus, likely extension of inflammation of the pancreas. The gallbladder is otherwise unremarkable. The liver, spleen, and adrenal glands appear unremarkable. There is no hydronephrosis. Bilateral renal vascular calcifications noted. The visualized ureters appear unremarkable. The urinary bladder is only partially distended. There is apparent diffuse thickening of the bladder wall which may be partly related to underdistention. Cystitis is not excluded. Correlation with urinalysis recommended. The prostate gland is grossly unremarkable. There is dense stool within the colon as well as rectum compatible with constipation with possible fecal impaction within the rectum. There is a moderate size sliding hiatal hernia. No evidence of bowel obstruction or inflammation. Normal appendix. There is aortoiliac atherosclerotic disease. There is a 2.7 cm infrarenal abdominal aortic ectasia. The IVC appear patent. No portal venous gas identified. Top-normal pancreaticocaval and portocaval lymph nodes noted. No significant adenopathy. Small fat containing umbilical hernia. The osseous structures appear unremarkable. IMPRESSION: Diffuse inflammatory changes of the right upper abdomen and subhepatic area with infected appearing collection at the head of the pancreas and within the  falciform ligament compatible with pseudocysts or abscesses. No evidence of bowel obstruction or inflammation.  Normal appendix. Electronically Signed   By: Anner Crete [name]   On: 08/06/2015 23:08    CBC  Recent Labs Lab 08/06/15 2034 08/07/15 0206  WBC 11.9* 10.6*  HGB 10.4* 9.1*  HCT 33.9* 30.4*  PLT 411* 339  MCV 77.4* 77.4*  MCH 23.7* 23.2*  MCHC 30.7 29.9*  RDW 20.6* 20.5*  LYMPHSABS 1.1  --  MONOABS 1.3*  --   EOSABS 0.3  --   BASOSABS 0.0  --     Chemistries   Recent Labs Lab 08/06/15 2034 08/07/15 0206 08/08/15 0545  NA 138 138 136  K 3.9 3.7 3.8  CL 102 104 106  CO2 24 23 23   GLUCOSE 109* 146* 95  BUN 13 10 9   CREATININE 0.93 0.75 0.66  CALCIUM 8.7* 7.9* 8.2*  AST 14* 12* 11*  ALT 10* 10* 9*  ALKPHOS 69 61 55  BILITOT 0.5 0.7 0.4   ------------------------------------------------------------------------------------------------------------------ estimated creatinine clearance is 128.3 mL/min (by C-G formula based on Cr of 0.66). ------------------------------------------------------------------------------------------------------------------ No results for input(s): HGBA1C in the last 72 hours. ------------------------------------------------------------------------------------------------------------------ No results for input(s): CHOL, HDL, LDLCALC, TRIG, CHOLHDL, LDLDIRECT in the last 72 hours. ------------------------------------------------------------------------------------------------------------------ No results for input(s): TSH, T4TOTAL, T3FREE, THYROIDAB in the last 72 hours.  Invalid input(s): FREET3 ------------------------------------------------------------------------------------------------------------------ No results for input(s): VITAMINB12, FOLATE, FERRITIN, TIBC, IRON, RETICCTPCT in the last 72 hours.  Coagulation profile  Recent Labs Lab 08/07/15 0207  INR 1.25    No results for input(s): DDIMER in the last 72  hours.  Cardiac Enzymes  Recent Labs Lab 08/06/15 2034  TROPONINI 0.03   ------------------------------------------------------------------------------------------------------------------ Invalid input(s): POCBNP    Estell Dillinger D.O. on 08/08/2015 at 1:45 PM  Between 7am to 7pm - Pager - [phone]  After 7pm go to [email or web address] - password [email or web address]  And look for the night coverage person covering for me after hours  [facility] Office  [phone]

## 2015-08-08 NOTE — Progress Notes (Signed)
OT Cancellation Note  Patient Details Name: [name]. MRN: [id number] DOB: [birth year]   Cancelled Treatment:    Reason Eval/Treat Not Completed: Patient declined, no reason specified;Other (comment) Met with pt to discuss role of OT and to ascertain need for eval vs. screening out (per PT note pt independent with mobility). Pt hesitant in responses, stating "I'm just trying to get through this right now (motioning to IV). Maybe if you come back later." Pt verbalizing concerns about residual effects of CVA this past summer. OT to reattempt as schedule permits.   Hortencia Pilar 08/08/2015, 9:16 AM

## 2015-08-08 NOTE — Progress Notes (Signed)
OT Cancellation Note  Patient Details Name: [name]. MRN: [id number] DOB: 1961/03/11   Cancelled Treatment:    Reason Eval/Treat Not Completed: OT screened, no needs identified, will sign off. Pt ambulating independently in room upon therapist arrival. Discussed screening out OT services with pt in agreement.  Hortencia Pilar 08/08/2015, 12:08 PM

## 2015-08-08 NOTE — Progress Notes (Signed)
Patient states that "he wants to be d/c from tele and that he dont need it". Expressed to patient the significant of having tele monitor but he still prefers to not have it on. MD notified and made aware.

## 2015-08-09 ENCOUNTER — Encounter (HOSPITAL_COMMUNITY): Payer: Self-pay | Admitting: General Practice

## 2015-08-09 LAB — COMPREHENSIVE METABOLIC PANEL
ALT: 10 U/L — AB (ref 17–63)
AST: 14 U/L — ABNORMAL LOW (ref 15–41)
Albumin: 2.7 g/dL — ABNORMAL LOW (ref 3.5–5.0)
Alkaline Phosphatase: 61 U/L (ref 38–126)
Anion gap: 9 (ref 5–15)
BUN: 7 mg/dL (ref 6–20)
CHLORIDE: 104 mmol/L (ref 101–111)
CO2: 24 mmol/L (ref 22–32)
CREATININE: 0.78 mg/dL (ref 0.61–1.24)
Calcium: 8.4 mg/dL — ABNORMAL LOW (ref 8.9–10.3)
GFR calc non Af Amer: >60 mL/min (ref >60)
Glucose, Bld: 86 mg/dL (ref 65–99)
Potassium: 3.8 mmol/L (ref 3.5–5.1)
SODIUM: 137 mmol/L (ref 135–145)
Total Bilirubin: 0.6 mg/dL (ref 0.3–1.2)
Total Protein: 7 g/dL (ref 6.5–8.1)

## 2015-08-09 LAB — CBC
HCT: 30 % — ABNORMAL LOW (ref 39.0–52.0)
Hemoglobin: 8.9 g/dL — ABNORMAL LOW (ref 13.0–17.0)
MCH: 23.1 pg — AB (ref 26.0–34.0)
MCHC: 29.7 g/dL — ABNORMAL LOW (ref 30.0–36.0)
MCV: 77.7 fL — ABNORMAL LOW (ref 78.0–100.0)
PLATELETS: 430 10*3/uL — AB (ref 150–400)
RBC: 3.86 MIL/uL — AB (ref 4.22–5.81)
RDW: 20.7 % — ABNORMAL HIGH (ref 11.5–15.5)
WBC: 10.1 10*3/uL (ref 4.0–10.5)

## 2015-08-09 LAB — GLUCOSE, CAPILLARY: GLUCOSE-CAPILLARY: 84 mg/dL (ref 65–99)

## 2015-08-09 LAB — LIPASE, BLOOD: Lipase: 40 U/L (ref 11–51)

## 2015-08-09 MED ORDER — SODIUM CHLORIDE 0.9 % IV SOLN
510.0000 mg | Freq: Once | INTRAVENOUS | Status: AC
  Administered 2015-08-09: 510 mg via INTRAVENOUS
  Filled 2015-08-09: qty 17

## 2015-08-09 MED ORDER — OXYCODONE-ACETAMINOPHEN 5-325 MG PO TABS: 1.0000 | ORAL_TABLET | Freq: Three times a day (TID) | ORAL | Status: DC | PRN

## 2015-08-09 NOTE — Progress Notes (Signed)
Progress Note   Subjective  Patient reports feeling significantly improved today. He reports minimal abdominal pain this morning and has an appetite. He states he wants to go home since he feels so well. No fevers. No blood in the stools.    Objective   Vital signs in last 24 hours: Temp:  [97.3 F (36.3 C)-98.1 F (36.7 C)] 97.6 F (36.4 C) (12/30 0433) Pulse Rate:  [66-73] 66 (12/30 0433) Resp:  [16-18] 18 (12/30 0433) BP: (107-159)/(54-89) 159/86 mmHg (12/30 0433) SpO2:  [95 %-98 %] 96 % (12/30 0433) Last BM Date: 08/08/15 General:    white male in NAD Heart:  Regular rate and rhythm; no murmurs Lungs: Respirations even and unlabored, lungs CTA bilaterally Abdomen:  Soft, minimal epigastric and RUQ TTP without rebound or guarding, and nondistended. Normal bowel sounds. Extremities:  Without edema. Neurologic:  Alert and oriented,  grossly normal neurologically. Psych:  Cooperative. Normal mood and affect.  Intake/Output from previous day: 12/29 0701 - 12/30 0700 In: 0  Out: 825 [Urine:825] Intake/Output this shift: Total I/O In: -  Out: 300 [Urine:300]  Lab Results:  Recent Labs  08/06/15 2034 08/07/15 0206 08/09/15 0545  WBC 11.9* 10.6* 10.1  HGB 10.4* 9.1* 8.9*  HCT 33.9* 30.4* 30.0*  PLT 411* 339 430*   BMET  Recent Labs  08/07/15 0206 08/08/15 0545 08/09/15 0545  NA 138 136 137  K 3.7 3.8 3.8  CL 104 106 104  CO2 23 23 24   GLUCOSE 146* 95 86  BUN 10 9 7   CREATININE 0.75 0.66 0.78  CALCIUM 7.9* 8.2* 8.4*   LFT  Recent Labs  08/09/15 0545  PROT 7.0  ALBUMIN 2.7*  AST 14*  ALT 10*  ALKPHOS 61  BILITOT 0.6   PT/INR  Recent Labs  08/07/15 0207  LABPROT 15.9*  INR 1.25    Studies/Results: No results found.     Assessment / Plan:   54 y/o male with an episode of suspected alcohol related acute pancreatitis on November 6th, who initially got better on liquid diet at home, but 2 weeks after that diagnosis has developed  recurrence of abdominal pain and had intervally gotten worse leading to admission. CT showed peripancreatic fluid collections. He has had no significant leukocytosis and would agree these do not seem infected and that they are not large, however they could be driving his symptoms which have worsened recently. I reviewed his CT with radiology, the pancreatic duct is not dilated, and the fluid collections are seem mildly encapsulated. I spoke with Dr. [name] of [facility] yesterday to see if this patient would be a candidate for a transpapillary stent or EUS guided drainage. It is not clear that the patient would benefit from these interventions at this point, although he offered EUS and would consider transpapillary stent if the pancreatic duct was dilated, or consider drainage of fluid collections, if the patient failed conservative management.   The patient appears considerably improved following 2 days of bowel rest. His LFTs and lipase remain normal. At this time I would recommend clear liquid diet, and consider a bland / low fat diet later today if he tolerates liquids. If his pain is well controlled on oral pain medications and he tolerates a diet, he can be discharged perhaps tomorrow or this evening. I don't think he warrants antibiotics, as I doubt these fluid collections represent abscesses. He's never had fevers or significant leukocytosis. We discussed the importance of alcohol abstinence  moving forward and he agreed he will avoid all alcohol.   He otherwise has an ongoing iron deficiency anemia for which he is in need of EGD and colonoscopy, and this is pending as an outpatient. He reports he has not tolerated oral iron and stopped it. His anemia has worsened since admission due to multiple blood draws, he has no evidence of GI bleeding at this time. Recommend an IV iron infusion today to help his anemia, and hopefully perform EGD / colonoscopy in the near future as an outpatient. He should  continue PPI as he is in need of regular aspirin for his history of CAD and CVA.   Plan: Clear liquid diet, advance to low fat / bland diet as tolerate Oral pain regimen, please avoid ALL NSAIDs. Oral narcotic is okay IV iron infusion today if possible Stop antibiotics Continue PPI Consider discharge tomorrow or late today pending patient's course (he adamantly wants to go home today, would want to ensure he tolerates solid food prior to discharge) Outpatient EGD / colonoscopy  Please call with questions / concerns.   Elwood Cellar, MD [facility] Pager [phone]   Principal Problem:   Abdominal pain Active Problems:   Dyslipidemia   TOBACCO ABUSE   HYPERTENSION, BENIGN ESSENTIAL   CAD, stents, subsequent CABG   Depression   Stroke ~ 2008, 02-2015   Alcohol abuse   Hx of cocaine abuse   Pancreatitis   GERD (gastroesophageal reflux disease)   Pancreatitis, acute   Abnormal CT scan, pancreas or bile duct     LOS: 2 days   Renelda Loma Armbruster  08/09/2015, 8:08 AM

## 2015-08-09 NOTE — Discharge Instructions (Signed)
Acute Pancreatitis  Acute pancreatitis is a disease in which the pancreas becomes suddenly irritated (inflamed). The pancreas is a large gland behind your stomach. The pancreas makes enzymes that help digest food. The pancreas also makes 2 hormones that help control your blood sugar. Acute pancreatitis happens when the enzymes attack and damage the pancreas. Most attacks last a couple of days and can cause serious problems.  HOME CARE  · Follow your doctor's diet instructions. You may need to avoid alcohol and limit fat in your diet.  · Eat small meals often.  · Drink enough fluids to keep your pee (urine) clear or pale yellow.  · Only take medicines as told by your doctor.  · Avoid drinking alcohol if it caused your disease.  · Do not smoke.  · Get plenty of rest.  · Check your blood sugar at home as told by your doctor.  · Keep all doctor visits as told.  GET HELP IF:  · You do not get better as quickly as expected.  · You have new or worsening symptoms.  · You have lasting pain, weakness, or feel sick to your stomach (nauseous).  · You get better and then have another pain attack.  GET HELP RIGHT AWAY IF:   · You are unable to eat or keep fluids down.  · Your pain becomes severe.  · You have a fever or lasting symptoms for more than 2 to 3 days.  · You have a fever and your symptoms suddenly get worse.  · Your skin or the white part of your eyes turn yellow (jaundice).  · You throw up (vomit).  · You feel dizzy, or you pass out (faint).  · Your blood sugar is high (over 300 mg/dL).  MAKE SURE YOU:   · Understand these instructions.  · Will watch your condition.  · Will get help right away if you are not doing well or get worse.     This information is not intended to replace advice given to you by your health care provider. Make sure you discuss any questions you have with your health care provider.     Document Released: 01/13/2008 Document Revised: 08/17/2014 Document Reviewed: 11/05/2011  [employer]vier Interactive  Patient Education ©2016 [employer]

## 2015-08-09 NOTE — Discharge Summary (Signed)
Physician Discharge Summary  [name]. [id number] DOB: 11-07-1960 DOA: 08/06/2015  PCP: Kenneth November, MD  Admit date: 08/06/2015 Discharge date: 08/09/2015  Time spent: 45 minutes  Recommendations for Outpatient Follow-up:  Patient will be discharged to home.  Patient will need to follow up with primary care provider within one week of discharge, repeat CBC and CMP.  Follow up with gastroenterology at specified time.  Patient should continue medications as prescribed.  Patient should follow a heart healthy diet.   Discharge Diagnoses:  Acute abdominal pain secondary to acute pancreatitis Iron deficiency anemia Hypertension Coronary artery disease Alcohol/tobacco abuse Hyperlipidemia History of CVA Depression GERD  Discharge Condition: Stable  Diet recommendation: Heart healthy  Filed Weights   08/07/15 0216  Weight: 97.07 kg (214 lb)    History of present illness:  on 08/07/2015 by Dr. [name]. is a 54 y.o. male with PMH of pancreatitis, hypertension, hyperlipidemia, GERD, depression, CAD, S/P of CABG, tobacco abuse, alcohol abuse, stroke, cocaine abuse, who presents with abdominal pain.  Patient reports that he was recently diagnosed as pancreatitis in [name], with lipase elevated up to 153. He has mild abdominal pain at baseline, which has worsened today. His abdominal pain is located in the right upper quadrant, constant, 10 out of 10 in severity, sharp, nonradiating. It is not associated with nausea vomiting or diarrhea. No fever, chills. He had transient oxygen desaturation to 83% in emergency room, which quickly improved to normal when placed on 2L [place]. Patient denies chest pain or shortness of breath. No cough, symptoms of UTI or unilateral weakness. Patient strongly denies alcohol abuse, states that he drinks 1 or 2 beers occasionally, last drinking was 2 weeks ago.  In ED, patient was found to have lipase 70, urinalysis with small  amount of leukocyte, negative troponin, WBC 11.7, temperature normal, no tachycardia, electrolytes and renal function okay. CT abdomen/pelvis showed diffuse inflammatory changes of the right upper abdomen and subhepatic area with infected appearing collection at the head of the pancreas and within the falciform ligament compatible with pseudocysts or abscesses. Patient is admitted to inpatient for further eval and treatment. General surgery was consulted by EDP. Dr. [name] will see pt in AM.  Hospital Course:  Acute abdominal pain secondary to acute pancreatitis -CT of the abdomen and pelvis -Lipase of 70 upon admission, currently 40 -Patient has had pancreatitis in the past -Gastroneurology consulted and appreciated, Dr. [name], spoke with radiologist, who collections appear to be walled off, likely inflammatory process continue medical management. Dr. [name] also spoke with [facility], Dr. [name] (GI), were freed CT scan with radiology. No pancreatic ductal dilatation noted. Patient may benefit from a GU asked to evaluate the pancreas and potentially drain the fluid collection however this would not be done until next week. -Leukocytosis resolved, zosyn discontinued -Follow up with GI -Placed on soft diet, tolerated well  Iron def Anemia -Iron <10 (10/21) -Will give one dose of feraheme, continue iron supplementation -Hb 8.9 (drop likely dilutional due to IVF) -Repeat CBC in one week  Hypertension -Continue metoprolol  Coronary artery disease -s/p stents/CABG -Currently chest pain-free, continue aspirin, metoprolol, statin  ? Alcohol abuse/ tobacco use -Patient denies alcohol use -Continue nicotine patch, discussed smoking cessation  Hyperlipidemia -Continue statin  Depression -Continue Celexa  History of CVA -Continue aspirin  GERD -Continue PPI  Procedures  None  Consults  Gastroenterology  Discharge Exam: Filed Vitals:   08/08/15 2203 08/09/15 0433  BP:  135/89  159/86  Pulse: 73 66  Temp: 98.1 F (36.7 C) 97.6 F (36.4 C)  Resp: 18 18   Exam  General: Well developed, well nourished, NAD, appears stated age  74: NCAT mucous membranes moist.   Cardiovascular: S1 S2 auscultated, no rubs, murmurs or gallops. Regular rate and rhythm.  Respiratory: Clear to auscultation bilaterally with equal chest rise  Abdomen: Soft, Epigastric/RUQ mild TTP, nondistended, + bowel sounds  Extremities: warm dry without cyanosis clubbing or edema  Neuro: AAOx3, nonfocal  Psych: Normal affect and demeanor   Discharge Instructions      Discharge Instructions    Diet - low sodium heart healthy    Complete by:  As directed      Increase activity slowly    Complete by:  As directed             Medication List    STOP taking these medications        acetaminophen 500 MG tablet  Commonly known as:  TYLENOL     HYDROcodone-acetaminophen 7.5-325 MG tablet  Commonly known as:  NORCO     Na Sulfate-K Sulfate-Mg Sulf Soln      TAKE these medications        aspirin 325 MG tablet  Take 1 tablet (325 mg total) by mouth daily.     b complex vitamins tablet  Take 1 tablet by mouth daily.     citalopram 20 MG tablet  Commonly known as:  CELEXA  Take 1 tablet (20 mg total) by mouth daily.     ferrous sulfate 325 (65 FE) MG tablet  Take 325 mg by mouth daily with breakfast. Reported on 08/06/2015     metoprolol tartrate 25 MG tablet  Commonly known as:  LOPRESSOR  Take 0.5 tablets (12.5 mg total) by mouth 2 (two) times daily.     nitroGLYCERIN 0.4 MG SL tablet  Commonly known as:  NITROSTAT  Place 1 tablet (0.4 mg total) under the tongue every 5 (five) minutes as needed for chest pain.     oxyCODONE-acetaminophen 5-325 MG tablet  Commonly known as:  ROXICET  Take 1 tablet by mouth every 8 (eight) hours as needed for severe pain.     pantoprazole 40 MG tablet  Commonly known as:  PROTONIX  Take 1 tablet (40 mg total) by mouth 2  (two) times daily before a meal.     simvastatin 20 MG tablet  Commonly known as:  ZOCOR  Take 1 tablet (20 mg total) by mouth at bedtime.       No Known Allergies Follow-up Information    Follow up with Hvozdovic, Vita Barley, PA-C On 08/23/2015.   Specialty:  Gastroenterology   Why:  12:45 with PA for follow up of pancreatitis.  keep scheduled appts 1/20 for RN visit and 2/3 colonoscopy/upper endoscopy as previously scheduled    Contact information:   [place] [address] [phone]       Follow up with Kenneth November, MD. Schedule an appointment as soon as possible for a visit in 1 week.   Specialty:  Internal Medicine   Why:  Hospital follow up   Contact information:   [place] [id number] [phone]        The results of significant diagnostics from this hospitalization (including imaging, microbiology, ancillary and laboratory) are listed below for reference.    Significant Diagnostic Studies: Ct Abdomen Pelvis W Contrast  08/06/2015  CLINICAL DATA:  54 year old male EXAM: CT ABDOMEN AND PELVIS WITH CONTRAST TECHNIQUE: Multidetector CT imaging of the abdomen and pelvis was performed using the standard protocol following bolus administration of intravenous contrast. CONTRAST:  135mL OMNIPAQUE IOHEXOL 300 MG/ML  SOLN COMPARISON:  CT dated 06/16/2015 FINDINGS: Minimal bibasilar dependent atelectatic changes. Mild cardiomegaly. There is coronary vascular calcification. No intra-abdominal free air.  No free fluid. There is inflammatory changes of the fat adjacent to the head of the pancreas. There is a fluid collection with enhancing rim anterior to the uncinate process of the pancreas measuring 2.4 x 3.7 cm in axial dimensions and 2.8 cm in craniocaudal length compatible with a pseudocyst. Is there is diffuse inflammatory changes and stranding of the mesentery in the right upper abdomen and subhepatic region. There is a 3.0 x 1.1 cm fluid  collection in the falciform ligament compatible with a pseudocyst/abscess. There is mild inflammatory changes of the fat surrounding the gallbladder fundus, likely extension of inflammation of the pancreas. The gallbladder is otherwise unremarkable. The liver, spleen, and adrenal glands appear unremarkable. There is no hydronephrosis. Bilateral renal vascular calcifications noted. The visualized ureters appear unremarkable. The urinary bladder is only partially distended. There is apparent diffuse thickening of the bladder wall which may be partly related to underdistention. Cystitis is not excluded. Correlation with urinalysis recommended. The prostate gland is grossly unremarkable. There is dense stool within the colon as well as rectum compatible with constipation with possible fecal impaction within the rectum. There is a moderate size sliding hiatal hernia. No evidence of bowel obstruction or inflammation. Normal appendix. There is aortoiliac atherosclerotic disease. There is a 2.7 cm infrarenal abdominal aortic ectasia. The IVC appear patent. No portal venous gas identified. Top-normal pancreaticocaval and portocaval lymph nodes noted. No significant adenopathy. Small fat containing umbilical hernia. The osseous structures appear unremarkable. IMPRESSION: Diffuse inflammatory changes of the right upper abdomen and subhepatic area with infected appearing collection at the head of the pancreas and within the falciform ligament compatible with pseudocysts or abscesses. No evidence of bowel obstruction or inflammation.  Normal appendix. Electronically Signed   By: Anner Crete [name]   On: 08/06/2015 23:08    Microbiology: Recent Results (from the past 240 hour(s))  Urine culture     Status: None   Collection Time: 08/06/15  8:29 PM  Result Value Ref Range Status   Specimen Description URINE, RANDOM  Final   Special Requests NONE  Final   Culture NO GROWTH 1 DAY  Final   Report Status 08/08/2015 FINAL   Final  Culture, blood (Routine X 2) w Reflex to ID Panel     Status: None (Preliminary result)   Collection Time: 08/07/15  1:40 AM  Result Value Ref Range Status   Specimen Description BLOOD LEFT HAND  Final   Special Requests BOTTLES DRAWN AEROBIC AND ANAEROBIC 5CC  Final   Culture NO GROWTH 2 DAYS  Final   Report Status PENDING  Incomplete  Culture, blood (Routine X 2) w Reflex to ID Panel     Status: None (Preliminary result)   Collection Time: 08/07/15  1:45 AM  Result Value Ref Range Status   Specimen Description BLOOD LEFT ANTECUBITAL  Final   Special Requests BOTTLES DRAWN AEROBIC AND ANAEROBIC 5ML  Final   Culture NO GROWTH 2 DAYS  Final   Report Status PENDING  Incomplete     Labs: Basic Metabolic Panel:  Recent Labs Lab 08/06/15 2034 08/07/15 0206 08/08/15 0545 08/09/15 0545  NA 138 138 136 137  K 3.9 3.7 3.8 3.8  CL 102 104 106 104  CO2 24 23 23 24   GLUCOSE 109* 146* 95 86  BUN 13 10 9 7   CREATININE 0.93 0.75 0.66 0.78  CALCIUM 8.7* 7.9* 8.2* 8.4*   Liver Function Tests:  Recent Labs Lab 08/06/15 2034 08/07/15 0206 08/08/15 0545 08/09/15 0545  AST 14* 12* 11* 14*  ALT 10* 10* 9* 10*  ALKPHOS 69 61 55 61  BILITOT 0.5 0.7 0.4 0.6  PROT 7.6 6.6 6.6 7.0  ALBUMIN 3.1* 2.6* 2.5* 2.7*    Recent Labs Lab 08/06/15 2034 08/09/15 0545  LIPASE 70* 40   No results for input(s): AMMONIA in the last 168 hours. CBC:  Recent Labs Lab 08/06/15 2034 08/07/15 0206 08/09/15 0545  WBC 11.9* 10.6* 10.1  NEUTROABS 9.2*  --   --   HGB 10.4* 9.1* 8.9*  HCT 33.9* 30.4* 30.0*  MCV 77.4* 77.4* 77.7*  PLT 411* 339 430*   Cardiac Enzymes:  Recent Labs Lab 08/06/15 2034  TROPONINI 0.03   BNP: BNP (last 3 results) No results for input(s): BNP in the last 8760 hours.  ProBNP (last 3 results) No results for input(s): PROBNP in the last 8760 hours.  CBG:  Recent Labs Lab 08/07/15 0808 08/08/15 0731 08/09/15 0714  GLUCAP 98 86 84    Signed:  Lorre Opdahl  [facility] 08/09/2015, 12:37 PM

## 2015-08-09 NOTE — Progress Notes (Signed)
AVS given to patient. IV removed. Iron administered prior to leaving. Belongings packed. Transportation arranged by patient. Instructions understanding demonstrated verbally.

## 2015-08-12 LAB — CULTURE, BLOOD (ROUTINE X 2)
CULTURE: NO GROWTH
Culture: NO GROWTH

## 2015-08-20 ENCOUNTER — Telehealth: Payer: Self-pay

## 2015-08-20 NOTE — Telephone Encounter (Signed)
PCP: [name], MD  Admit date: 08/06/2015 Discharge date: 08/09/2015  Time spent: 45 minutes  Recommendations for Outpatient Follow-up:  Patient will be discharged to home. Patient will need to follow up with primary care provider within one week of discharge, repeat CBC and CMP. Follow up with gastroenterology at specified time. Patient should continue medications as prescribed. Patient should follow a heart healthy diet.   Discharge Diagnoses:  Acute abdominal pain secondary to acute pancreatitis Iron deficiency anemia Hypertension Coronary artery disease Alcohol/tobacco abuse Hyperlipidemia History of CVA Depression GERD  Discharge Condition: Stable  Diet recommendation: Heart healthy    Called to follow up with patient.  He says that he continues to experience upper abdominal pain and nausea without vomiting.  States symptoms have not really improved.  The first 3-4 days were ok, but then he ate something and has been hurting ever since.  He has been taking aleve and occasionally oxycodone as needed.  State he's thinking about going back to the hospital.  States pain is currently 8 or 9/10.  He has been drinking fluids and eating some, but not a lot.  Pt was advised to go back to ER.  Pt states he thinks he can wait until tomorrow.  Hospital follow up appt scheduled with Dr. [name] tomorrow (08/21/15) at 1:30 pm per patient request.  Sooner appt offered. Pt declined.  Before hanging up, pt was again strongly advised to go to ER for pain.  Pt states he will follow with Dr. [name], but would go to ER if symptoms worsen or fail to improve.

## 2015-08-21 ENCOUNTER — Ambulatory Visit: Payer: Medicaid Other | Admitting: Internal Medicine

## 2015-08-23 ENCOUNTER — Ambulatory Visit (INDEPENDENT_AMBULATORY_CARE_PROVIDER_SITE_OTHER): Payer: Medicaid Other | Admitting: Physician Assistant

## 2015-08-23 ENCOUNTER — Telehealth: Payer: Self-pay | Admitting: Internal Medicine

## 2015-08-23 ENCOUNTER — Encounter: Payer: Self-pay | Admitting: Physician Assistant

## 2015-08-23 ENCOUNTER — Other Ambulatory Visit (INDEPENDENT_AMBULATORY_CARE_PROVIDER_SITE_OTHER): Payer: Medicaid Other

## 2015-08-23 VITALS — BP 128/68 | HR 95 | Ht 71.0 in | Wt 194.0 lb

## 2015-08-23 DIAGNOSIS — D509 Iron deficiency anemia, unspecified: Secondary | ICD-10-CM | POA: Diagnosis not present

## 2015-08-23 DIAGNOSIS — K859 Acute pancreatitis without necrosis or infection, unspecified: Secondary | ICD-10-CM

## 2015-08-23 DIAGNOSIS — K219 Gastro-esophageal reflux disease without esophagitis: Secondary | ICD-10-CM

## 2015-08-23 LAB — COMPREHENSIVE METABOLIC PANEL
ALBUMIN: 3.6 g/dL (ref 3.5–5.2)
ALT: 5 U/L (ref 0–53)
AST: 8 U/L (ref 0–37)
Alkaline Phosphatase: 62 U/L (ref 39–117)
BUN: 16 mg/dL (ref 6–23)
CHLORIDE: 102 meq/L (ref 96–112)
CO2: 29 meq/L (ref 19–32)
CREATININE: 0.87 mg/dL (ref 0.40–1.50)
Calcium: 9.9 mg/dL (ref 8.4–10.5)
GFR: 97.6 mL/min (ref >60.00)
GLUCOSE: 93 mg/dL (ref 70–99)
Potassium: 4.2 mEq/L (ref 3.5–5.1)
SODIUM: 139 meq/L (ref 135–145)
Total Bilirubin: 0.6 mg/dL (ref 0.2–1.2)
Total Protein: 7.7 g/dL (ref 6.0–8.3)

## 2015-08-23 LAB — CBC WITH DIFFERENTIAL/PLATELET
BASOS PCT: 0.9 % (ref 0.0–3.0)
Basophils Absolute: 0.1 10*3/uL (ref 0.0–0.1)
EOS ABS: 0.5 10*3/uL (ref 0.0–0.7)
Eosinophils Relative: 5.1 % — ABNORMAL HIGH (ref 0.0–5.0)
HCT: 36 % — ABNORMAL LOW (ref 39.0–52.0)
HEMOGLOBIN: 11.3 g/dL — AB (ref 13.0–17.0)
Lymphocytes Relative: 12.9 % (ref 12.0–46.0)
Lymphs Abs: 1.3 10*3/uL (ref 0.7–4.0)
MCHC: 31.4 g/dL (ref 30.0–36.0)
MCV: 77.6 fl — ABNORMAL LOW (ref 78.0–100.0)
MONO ABS: 0.9 10*3/uL (ref 0.1–1.0)
Monocytes Relative: 9.1 % (ref 3.0–12.0)
NEUTROS PCT: 72 % (ref 43.0–77.0)
Neutro Abs: 7.4 10*3/uL (ref 1.4–7.7)
Platelets: 436 10*3/uL — ABNORMAL HIGH (ref 150.0–400.0)
RBC: 4.64 Mil/uL (ref 4.22–5.81)
RDW: 26.5 % — ABNORMAL HIGH (ref 11.5–15.5)
WBC: 10.2 10*3/uL (ref 4.0–10.5)

## 2015-08-23 LAB — IBC PANEL
Iron: 14 ug/dL — ABNORMAL LOW (ref 42–165)
SATURATION RATIOS: 5.6 % — AB (ref 20.0–50.0)
Transferrin: 180 mg/dL — ABNORMAL LOW (ref 212.0–360.0)

## 2015-08-23 LAB — LIPASE: Lipase: 66 U/L — ABNORMAL HIGH (ref 11.0–59.0)

## 2015-08-23 LAB — AMYLASE: AMYLASE: 60 U/L (ref 27–131)

## 2015-08-23 LAB — FOLATE: FOLATE: 9.1 ng/mL (ref >5.9)

## 2015-08-23 LAB — VITAMIN B12: VITAMIN B 12: 371 pg/mL (ref 211–911)

## 2015-08-23 MED ORDER — HYDROCODONE-ACETAMINOPHEN 7.5-325 MG PO TABS: 1.0000 | ORAL_TABLET | Freq: Two times a day (BID) | ORAL | Status: DC | PRN

## 2015-08-23 NOTE — Patient Instructions (Addendum)
Your physician has requested that you go to the basement for lab work before leaving today.  We have given you a copy of a low fat diet  Please make the following changes to your medications: Pantoprazole 40 mg every morning Ferrous Fumate one daily Restart Metoprolol Hydrocodone-Acetaminophen 7.5/325 mg twice a day as needed Restart Simvastatin

## 2015-08-23 NOTE — Telephone Encounter (Signed)
No Charge 

## 2015-08-23 NOTE — Telephone Encounter (Signed)
Pt was no show 08/21/15 1:30pm for 30 min OV, pt has rescheduled to 09/30/15, charge or no charge?

## 2015-08-25 ENCOUNTER — Encounter: Payer: Self-pay | Admitting: Physician Assistant

## 2015-08-25 NOTE — Progress Notes (Signed)
Patient ID: [name]., male   DOB: 1961-02-21, 56 y.o.   MRN: [id number]     History of Present Illness: [name]. is a pleasant 55 year old male who was recently admitted 08/06/2015 through 08/09/2015 for pancreatitis. This was suspected to be alcohol related. He had had a previous episode of alcohol-related pancreatitis on November 6 from which he initially got better on a liquid diet at home but 2 weeks after that diagnosis developed some recurrent abdominal pain Worse leading to an admission. CT showed. Pancreatic fluid collections. He had no significant leukocytosis. Pancreatic duct was not dilated and the fluid collection seemed mildly  Encapsulated. Dr. [name] spoke with Dr. [name] of [facility] to see if the patient would be a candidate for a trans-papillary stent or EUS guided drainage. It was not clear if the patient would benefit from these interventions at this point. Patient improved considerably after 2 days of bowel rest and his LFTs and lipase remained normal. Was discharged home on a low-fat diet he was also noted to have an ongoing iron deficiency anemia for which he had previously been scheduled for an EGD and colonoscopy prior to admission. The patient reported that he had not tolerated oral iron and stopped it. As of today he states that he is trying to make his pain medicines last. He is using a pain pill once or twice daily and took his last one this morning. He is asking for refill and I told him we would give him enough for one week but from that point on he must get all further pain medicines from his primary care provider. Patient also reports that he has not been taking his pantoprazole metoprolol , or simvastatin because he doesn't know they are for.   Past Medical History  Diagnosis Date  . Hypertension   . History of tobacco abuse     still smokes  . Myocardial infarction [facility]) 12/07/2000    STEMI March 2010, angioplasty of Circumflex and  emergent CABG November 07, 2008, bare metal stent to SVG to Circumflex with bare metal stent  November 2010.   [name] Chest pain   . Coronary artery disease   . Cerebrovascular disease ~2008 and 02-2015  . Abnormality of gait   . Cocaine use     denies current use   . CHF (congestive heart failure) ([place])   . Pancreatitis 06/2015  . Iron deficiency anemia 2010  . Emphysema (subcutaneous) (surgical) resulting from a procedure 10/2008.    Bleb resected at time of 10/2008 CABG. Marketed emphysema noted on surgical report.  . Stroke [facility])     Past Surgical History  Procedure Laterality Date  . Cardiac catheterization  11/07/2008    EF of 50-55% -- normal LV systolic function, acute inferolateral ST elevation MI,  PTCA of the circumflex artery -- [name]. [name], [name]  . Coronary artery bypass graft    . Left heart catheterization with coronary/graft angiogram N/A 05/15/2013    Procedure: LEFT HEART CATHETERIZATION WITH Beatrix Fetters;  Surgeon: [name], MD;  Location: [facility] CATH LAB;  Service: Cardiovascular;  Laterality: N/A;   Family History  Problem Relation Age of Onset  . Heart disease Mother   . Heart failure Mother   . Diabetes Mother   . Prostate cancer Neg Hx   . Colon cancer Father     late 17s   Social History  Substance Use Topics  . Smoking status: Former Smoker -- 1.00 packs/day  for 30 years    Types: Cigarettes    Quit date: 07/11/2015  . Smokeless tobacco: Never Used     Comment: 1 PPD  . Alcohol Use: 0.0 oz/week    0 Standard drinks or equivalent per week     Comment: 2 beers a day - has not had alcohol for the last month   Current Outpatient Prescriptions  Medication Sig Dispense Refill  . aspirin 325 MG tablet Take 1 tablet (325 mg total) by mouth daily.    [name] b complex vitamins tablet Take 1 tablet by mouth daily.    . nitroGLYCERIN (NITROSTAT) 0.4 MG SL tablet Place 1 tablet (0.4 mg total) under the tongue every 5 (five) minutes as needed for chest  pain. 25 tablet 6  . oxyCODONE-acetaminophen (ROXICET) 5-325 MG tablet Take 1 tablet by mouth every 8 (eight) hours as needed for severe pain. 20 tablet 0  . HYDROcodone-acetaminophen (NORCO) 7.5-325 MG tablet Take 1 tablet by mouth 2 (two) times daily as needed for moderate pain. 16 tablet 0  . metoprolol tartrate (LOPRESSOR) 25 MG tablet Take 0.5 tablets (12.5 mg total) by mouth 2 (two) times daily. (Patient not taking: Reported on 08/23/2015) 60 tablet 11  . pantoprazole (PROTONIX) 40 MG tablet Take 1 tablet (40 mg total) by mouth 2 (two) times daily before a meal. (Patient not taking: Reported on 08/06/2015) 60 tablet 3  . simvastatin (ZOCOR) 20 MG tablet Take 1 tablet (20 mg total) by mouth at bedtime. (Patient not taking: Reported on 08/23/2015) 30 tablet 11   No current facility-administered medications for this visit.   No Known Allergies   Review of Systems: Gen: Denies any fever, chills, sweats, anorexia, fatigue, weakness, malaise, weight loss, and sleep disorder CV: Denies chest pain, angina, palpitations, syncope, orthopnea, PND, peripheral edema, and claudication. Resp: Denies dyspnea at rest, dyspnea with exercise, cough, sputum, wheezing, coughing up blood, and pleurisy. GI: Denies vomiting blood, jaundice, and fecal incontinence.   Denies dysphagia or odynophagia. GU : Denies urinary burning, blood in urine, urinary frequency, urinary hesitancy, nocturnal urination, and urinary incontinence. MS: Denies joint pain, limitation of movement, and swelling, stiffness, low back pain, extremity pain. Denies muscle weakness, cramps, atrophy.  Derm: Denies rash, itching, dry skin, hives, moles, warts, or unhealing ulcers.  Psych: Denies depression, anxiety, memory loss, suicidal ideation, hallucinations, paranoia, and confusion. Heme: Denies bruising, bleeding, and enlarged lymph nodes. Neuro:  Denies any headaches, dizziness, paresthesia Endo:  Denies any problems with DM, thyroid,  adrenal   Studies:   Ct Abdomen Pelvis W Contrast  08/06/2015  CLINICAL DATA:  55 year old male EXAM: CT ABDOMEN AND PELVIS WITH CONTRAST TECHNIQUE: Multidetector CT imaging of the abdomen and pelvis was performed using the standard protocol following bolus administration of intravenous contrast. CONTRAST:  156mL OMNIPAQUE IOHEXOL 300 MG/ML  SOLN COMPARISON:  CT dated 06/16/2015 FINDINGS: Minimal bibasilar dependent atelectatic changes. Mild cardiomegaly. There is coronary vascular calcification. No intra-abdominal free air.  No free fluid. There is inflammatory changes of the fat adjacent to the head of the pancreas. There is a fluid collection with enhancing rim anterior to the uncinate process of the pancreas measuring 2.4 x 3.7 cm in axial dimensions and 2.8 cm in craniocaudal length compatible with a pseudocyst. Is there is diffuse inflammatory changes and stranding of the mesentery in the right upper abdomen and subhepatic region. There is a 3.0 x 1.1 cm fluid collection in the falciform ligament compatible with a pseudocyst/abscess. There is mild inflammatory  changes of the fat surrounding the gallbladder fundus, likely extension of inflammation of the pancreas. The gallbladder is otherwise unremarkable. The liver, spleen, and adrenal glands appear unremarkable. There is no hydronephrosis. Bilateral renal vascular calcifications noted. The visualized ureters appear unremarkable. The urinary bladder is only partially distended. There is apparent diffuse thickening of the bladder wall which may be partly related to underdistention. Cystitis is not excluded. Correlation with urinalysis recommended. The prostate gland is grossly unremarkable. There is dense stool within the colon as well as rectum compatible with constipation with possible fecal impaction within the rectum. There is a moderate size sliding hiatal hernia. No evidence of bowel obstruction or inflammation. Normal appendix. There is aortoiliac  atherosclerotic disease. There is a 2.7 cm infrarenal abdominal aortic ectasia. The IVC appear patent. No portal venous gas identified. Top-normal pancreaticocaval and portocaval lymph nodes noted. No significant adenopathy. Small fat containing umbilical hernia. The osseous structures appear unremarkable. IMPRESSION: Diffuse inflammatory changes of the right upper abdomen and subhepatic area with infected appearing collection at the head of the pancreas and within the falciform ligament compatible with pseudocysts or abscesses. No evidence of bowel obstruction or inflammation.  Normal appendix. Electronically Signed   By: Anner Crete [name]   On: 08/06/2015 23:08     Physical Exam: BP 128/68 mmHg  Pulse 95  Ht 5\' 11"  (1.803 m)  Wt 194 lb (87.998 kg)  BMI 27.07 kg/m2 General: Pleasant, well developed , male in no acute distress Head: Normocephalic and atraumatic Eyes:  sclerae anicteric, conjunctiva pink  Ears: Normal auditory acuity Lungs: Clear throughout to auscultation Heart: Regular rate and rhythm Abdomen: Soft, non distended,  Mild epigastric and left upper quadrant tenderness to palpation with no rebound or guarding, No masses, no hepatomegaly. Normal bowel sounds Musculoskeletal: Symmetrical with no gross deformities  Extremities: No edema  Neurological: Alert oriented x 4, grossly nonfocal Psychological:  Alert and cooperative. Normal mood and affect  Assessment and Recommendations:   #1. Pancreatitis. Repeat pancreatic enzymes , comprehensive metabolic panel, and CBC with differential will be obtained today. He has been reminded to adhere to a low-fat diet.     #2. Iron deficiency anemia. Patient is scheduled for an EGD and colonoscopy in 2 weeks. He will keep the appointment he has for these with Dr. [name]. In the meantime he's been instructed to restart his ferrous fumarate 1 by mouth daily. A repeat CBC has been ordered along with repeat iron studies. Patient is also  been instructed to restart his pantoprazole 40 mg once daily. He was also advised to restart his metoprolol and simvastatin. He has been provided with a prescription for hydrocodone-acetaminophen 7.5/325 mg 1 by mouth twice daily as needed #16 with no refill. He was advised that he would not be given any further pain medicines from this office, and that all further pain meds should come from his primary care provider. He voiced understanding.The risks, benefits, and alternatives to endoscopy with possible biopsy and possible dilation were discussed with the patient and they consent to proceed. The risks, benefits, and alternatives to colonoscopy with possible biopsy and possible polypectomy were discussed with the patient and they consent to proceed.        Adom Schoeneck, Deloris Ping 08/25/2015,

## 2015-08-26 ENCOUNTER — Other Ambulatory Visit: Payer: Self-pay | Admitting: *Deleted

## 2015-08-26 ENCOUNTER — Ambulatory Visit: Payer: Medicaid Other | Admitting: Internal Medicine

## 2015-08-26 DIAGNOSIS — D509 Iron deficiency anemia, unspecified: Secondary | ICD-10-CM

## 2015-08-26 NOTE — Progress Notes (Signed)
Agree with assessment and plan as outlined. I had previously discussed his case with Dr. [name] when he was admitted, they were considering EUS drainage / transpapillary stent for peripancreatic fluid collections but the patient improved with conservative therapy. He needs to abstain from alcohol given this was the most likely etiology for his pancreatitis.   Otherwise his Hgb is improved from previous but he previously had not tolerated oral iron and was noncompliant. I would recommend IV iron infusion if he doesn't tolerate oral iron, and agree with PPI use at this time. We will await EGD/colonoscopy to be done in the near future.

## 2015-08-30 ENCOUNTER — Telehealth: Payer: Self-pay | Admitting: Internal Medicine

## 2015-08-30 ENCOUNTER — Telehealth: Payer: Self-pay | Admitting: Physician Assistant

## 2015-08-30 MED ORDER — OXYCODONE-ACETAMINOPHEN 5-325 MG PO TABS: 1.0000 | ORAL_TABLET | Freq: Three times a day (TID) | ORAL | Status: DC | PRN

## 2015-08-30 NOTE — Telephone Encounter (Signed)
[name] can you ask him if he is drinking any alcohol, if so, he needs to completely abstain. Otherwise he needs to ensure he is on a low fat diet. He should be scheduled for an EGD and colonoscopy in the near future. It is okay to continue his pain medications if it controls his pain, as this can take several weeks to resolve and go away. His recent labs looked okay. Over time he will be able to come off pain meds, however if his symptoms persist we may need to obtain additional imaging, consider an MRI, to assess the fluid collections around his pancreas to ensure no interval enlargement.

## 2015-08-30 NOTE — Telephone Encounter (Signed)
Oxycodone Rx placed at front desk for pick up.

## 2015-08-30 NOTE — Telephone Encounter (Signed)
Patient states his PCM is only giving him 10 pain pills at a time. He cannot see to drive due to previous strokes and has to depend on someone to drive him to get his rx. Patient is going to talk with his PCM about this.

## 2015-08-30 NOTE — Telephone Encounter (Signed)
Patient states he is having pain after he eats or drinks. He states his PCM sent him a pain medication rx. He is asking about possible procedure that Dr. [name] was looking into. Please, advise.

## 2015-08-30 NOTE — Telephone Encounter (Signed)
I spoke with the patient, he continue having abdominal pain, moderate to severe, it does decrease with oxycodone (work better than hydrocodone); states is tired of having pain, does not like to keep taking pain medication long term. At this point, symptoms are probably due to chronic pancreatitis, I recommend the following: ER if severe symptoms, fever, chills, nausea or vomiting. Discontinue hydrocodone, refill oxycodone. Encouraged patient to call GI ---> according to the notes there are other options they are contemplating  such as stent, etc. He verbalized understanding and appreciated my advice. Also, continue with EtOH abstinence

## 2015-09-13 ENCOUNTER — Encounter: Payer: Self-pay | Admitting: Internal Medicine

## 2015-09-13 ENCOUNTER — Ambulatory Visit (AMBULATORY_SURGERY_CENTER): Payer: Medicaid Other | Admitting: Internal Medicine

## 2015-09-13 VITALS — BP 107/67 | HR 71 | Temp 97.4°F | Resp 18 | Ht 71.0 in | Wt 194.0 lb

## 2015-09-13 DIAGNOSIS — D509 Iron deficiency anemia, unspecified: Secondary | ICD-10-CM | POA: Diagnosis not present

## 2015-09-13 DIAGNOSIS — D122 Benign neoplasm of ascending colon: Secondary | ICD-10-CM

## 2015-09-13 DIAGNOSIS — D124 Benign neoplasm of descending colon: Secondary | ICD-10-CM

## 2015-09-13 DIAGNOSIS — K2950 Unspecified chronic gastritis without bleeding: Secondary | ICD-10-CM | POA: Diagnosis not present

## 2015-09-13 DIAGNOSIS — K219 Gastro-esophageal reflux disease without esophagitis: Secondary | ICD-10-CM

## 2015-09-13 DIAGNOSIS — D125 Benign neoplasm of sigmoid colon: Secondary | ICD-10-CM

## 2015-09-13 DIAGNOSIS — D123 Benign neoplasm of transverse colon: Secondary | ICD-10-CM | POA: Diagnosis not present

## 2015-09-13 DIAGNOSIS — R195 Other fecal abnormalities: Secondary | ICD-10-CM

## 2015-09-13 DIAGNOSIS — D128 Benign neoplasm of rectum: Secondary | ICD-10-CM

## 2015-09-13 DIAGNOSIS — Z800 Family history of malignant neoplasm of digestive organs: Secondary | ICD-10-CM

## 2015-09-13 MED ORDER — OXYCODONE-ACETAMINOPHEN 5-325 MG PO TABS: 1.0000 | ORAL_TABLET | Freq: Three times a day (TID) | ORAL | Status: DC | PRN

## 2015-09-13 MED ORDER — SODIUM CHLORIDE 0.9 % IV SOLN: 500.0000 mL | INTRAVENOUS | Status: DC

## 2015-09-13 NOTE — Progress Notes (Signed)
To recovery, report to Scott, RN, VSS 

## 2015-09-13 NOTE — Op Note (Signed)
[facility]  [address] [place], [id number]   ENDOSCOPY PROCEDURE REPORT  PATIENT: Kenneth, [name]  MR#: [id number] BIRTHDATE: [birth year] , 61  yrs. old GENDER: male ENDOSCOPIST: Jerene Bears, MD REFERRED BY:  [name], [name] PROCEDURE DATE:  09/13/2015 PROCEDURE:  EGD, diagnostic and EGD w/ biopsy ASA CLASS:     Class III INDICATIONS:  heartburn, iron deficiency anemia, and heme positive stool. MEDICATIONS: Monitored anesthesia care and Propofol 350 mg IV TOPICAL ANESTHETIC: none  DESCRIPTION OF PROCEDURE: After the risks benefits and alternatives of the procedure were thoroughly explained, informed consent was obtained.  The LB [id number] endoscope was introduced through the mouth and advanced to the second portion of the duodenum , Without limitations.  The instrument was slowly withdrawn as the mucosa was fully examined.   ESOPHAGUS: There was LA Class A reflux esophagitis noted at GEJ (39 cm).  No Barrett's esophagus seen.  STOMACH: A 7 cm hiatal hernia was noted.   The mucosa of the stomach appeared normal.  Cold forcep biopsies were taken at the gastric body, antrum and angularis to evaluate for h.  pylori.  DUODENUM: Mild duodenal inflammation was found in the duodenal bulb. The duodenal mucosa showed no abnormalities in the 2nd part of the duodenum.  Cold forcep biopsies were taken in the second portion.  Retroflexed views revealed a large hiatal hernia.     The scope was then withdrawn from the patient and the procedure completed.  COMPLICATIONS: There were no immediate complications.  ENDOSCOPIC IMPRESSION: 1.   There was mild reflux esophagitis noted 2.   Large, 7 cm, hiatal hernia 3.   The mucosa of the stomach appeared normal; multiple biopsies 4.   Duodenal inflammation was found in the duodenal bulb 5.   The duodenal mucosa showed no abnormalities in the 2nd part of the duodenum cold forcep biopsies were taken in the second  portion   RECOMMENDATIONS: 1.  Await pathology results 2.  Follow-up of helicobacter pylori status, treat if indicated 3.  Proceed with a Colonoscopy.  eSigned:  Jerene Bears, MD 09/13/2015 2:59 PM    CC: the patient, Dr. [name]  PATIENT NAME:  Kenneth, [name] MR#: [id number]

## 2015-09-13 NOTE — Op Note (Signed)
[facility]  [address] [place], [id number]   COLONOSCOPY PROCEDURE REPORT  PATIENT: [name], [name]  MR#: [id number] BIRTHDATE: 09/29/1960 , 54  yrs. old GENDER: male ENDOSCOPIST: Jerene Bears, MD REFERRED [id number] [name], [name] PROCEDURE DATE:  09/13/2015 PROCEDURE:   Colonoscopy, diagnostic and Colonoscopy with snare polypectomy First Screening Colonoscopy - Avg.  risk and is 50 yrs.  old or older - No.  Prior Negative Screening - Now for repeat screening. N/A  History of Adenoma - Now for follow-up colonoscopy & has been > or = to 3 yrs.  N/A  Polyps removed today? Yes ASA CLASS:   Class III INDICATIONS:Unexplained iron deficiency anemia, FH Colon or Rectal Adenocarcinoma, and heme-positive stool. MEDICATIONS: Monitored anesthesia care, this was the total dose used for all procedures at this session, and Propofol 800 mg IV  DESCRIPTION OF PROCEDURE:   After the risks benefits and alternatives of the procedure were thoroughly explained, informed consent was obtained.  The digital rectal exam revealed no rectal mass.   The LB [id number]  endoscope was introduced through the anus and advanced to the cecum, which was identified by both the appendix and ileocecal valve. No adverse events experienced. The quality of the prep was good.  (Suprep was used)  The instrument was then slowly withdrawn as the colon was fully examined. Estimated blood loss is zero unless otherwise noted in this procedure report.      COLON FINDINGS: Two sessile polyps ranging from 5 to 63mm in size were found in the ascending colon.  Polypectomies were performed with a cold snare (1) and using snare cautery (1).  The resection was complete, the polyp tissue was completely retrieved and sent to histology.   2 sessile polyp(s) ranging from 12 to 68mm in size were found at the hepatic flexure.  Polypectomies were performed using snare cautery.  The resection was complete, the polyp  tissue was completely retrieved and sent to histology.   Two sessile polyps ranging between 3-27mm in size were found in the transverse colon.  Polypectomies were performed with a cold snare.  The resection was complete, the polyp tissue was completely retrieved and sent to histology.   Four sessile polyps ranging from 8 to 66mm in size were found in the descending colon.  Polypectomies were performed using snare cautery.  The resection was complete, the polyp tissue was completely retrieved and sent to histology.   Two sessile polyps ranging from 5 to 36mm in size were found in the sigmoid colon.  Polypectomies were performed with a cold snare. The resection was complete, the polyp tissue was completely retrieved and sent to histology.   A sessile polyp measuring 5 mm in size was found in the rectum.  A polypectomy was performed with a cold snare.  The resection was complete, the polyp tissue was completely retrieved and sent to histology.  Retroflexed views revealed internal hemorrhoids. The time to cecum = 2.9 Withdrawal time = 37.6   The scope was withdrawn and the procedure completed.  COMPLICATIONS: There were no immediate complications.  ENDOSCOPIC IMPRESSION: 1.   Two sessile polyps ranging from 5 to 65mm in size were found in the ascending colon; polypectomies were performed with a cold snare and using snare cautery 2.   Two sessile polyp(s) ranging from 12 to 4mm in size were found at the hepatic flexure; polypectomies were performed using snare cautery 3.   Two sessile polyps ranging between 3-69mm in size were  found in the transverse colon; polypectomies were performed with a cold snare 4.   Four sessile polyps ranging from 8 to 73mm in size were found in the descending colon; polypectomies were performed using snare cautery 5.   Two sessile polyps ranging from 5 to 100mm in size were found in the sigmoid colon; polypectomies were performed with a cold snare 6.   Sessile  polyp was found in the rectum; polypectomy was performed with a cold snare 7.   Internal hemorrhoids  RECOMMENDATIONS: 1.  Hold Aspirin and all other NSAIDs for 3 weeks. 2.  Await pathology results 3.  Repeat Colonoscopy in 1 year. 4.  You will receive a letter within 1-2 weeks with the results of your biopsy as well as final recommendations.  Please call my office if you have not received a letter after 3 weeks.  eSigned:  Jerene Bears, MD 09/13/2015 3:07 PM   cc: the patient, Dr. [name]   PATIENT NAME:  [name], [name] MR#: [id number]

## 2015-09-13 NOTE — Progress Notes (Signed)
Called to room to assist during endoscopic procedure.  Patient ID and intended procedure confirmed with present staff. Received instructions for my participation in the procedure from the performing physician.  

## 2015-09-13 NOTE — Patient Instructions (Signed)

## 2015-09-16 ENCOUNTER — Telehealth: Payer: Self-pay

## 2015-09-16 NOTE — Telephone Encounter (Signed)
  Follow up Call-  Call back number 09/13/2015  Post procedure Call Back phone  # [phone]  Permission to leave phone message Yes     Patient was called for follow up after procedure on 09/13/2015. No answer at the number given for follow up phone call. A message was left on the answering machine.

## 2015-09-20 ENCOUNTER — Encounter: Payer: Self-pay | Admitting: Internal Medicine

## 2015-09-30 ENCOUNTER — Encounter: Payer: Self-pay | Admitting: Internal Medicine

## 2015-09-30 ENCOUNTER — Ambulatory Visit (INDEPENDENT_AMBULATORY_CARE_PROVIDER_SITE_OTHER): Payer: Medicaid Other | Admitting: Internal Medicine

## 2015-09-30 VITALS — BP 126/74 | HR 88 | Temp 98.3°F | Ht 71.0 in | Wt 195.5 lb

## 2015-09-30 DIAGNOSIS — K861 Other chronic pancreatitis: Secondary | ICD-10-CM

## 2015-09-30 DIAGNOSIS — D509 Iron deficiency anemia, unspecified: Secondary | ICD-10-CM

## 2015-09-30 DIAGNOSIS — K219 Gastro-esophageal reflux disease without esophagitis: Secondary | ICD-10-CM

## 2015-09-30 MED ORDER — OXYCODONE-ACETAMINOPHEN 5-325 MG PO TABS: 1.0000 | ORAL_TABLET | Freq: Three times a day (TID) | ORAL | Status: DC | PRN

## 2015-09-30 MED ORDER — METOPROLOL TARTRATE 25 MG PO TABS: 12.5000 mg | ORAL_TABLET | Freq: Two times a day (BID) | ORAL | Status: DC

## 2015-09-30 NOTE — Patient Instructions (Signed)
Please see your new primary doctor ASAP [facility])  If severe stomach pain: go to the ER

## 2015-09-30 NOTE — Progress Notes (Signed)
Subjective:    Patient ID: [name]., male    DOB: May 15, 1961, 55 y.o.   MRN: [id number]  DOS:  09/30/2015 Type of visit - description : Routine checkup Interval history: Was admitted to the hospital with pancreatitis in December, then follow-up by GI, pancreatitis was felt to be EtOH related. He also was diagnosed with iron deficiency anemia. EGD and colonoscopy were done, results reviewed. As far as the pancreatitis, he continue with persistent, mild to moderate abdominal pain however overall he feels much better than before. Not drinking alcohol..    Review of Systems  Denies fever chills No chest pain, difficulty breathing. No nausea, vomiting, diarrhea  Past Medical History  Diagnosis Date  . Hypertension   . History of tobacco abuse     still smokes  . Myocardial infarction [facility]) 12/07/2000    STEMI March 2010, angioplasty of Circumflex and emergent CABG November 07, 2008, bare metal stent to SVG to Circumflex with bare metal stent  November 2010.   [name] Chest pain   . Coronary artery disease   . Cerebrovascular disease ~2008 and 02-2015  . Abnormality of gait   . Cocaine use     denies current use   . CHF (congestive heart failure) ([place])   . Pancreatitis 06/2015  . Iron deficiency anemia 2010  . Emphysema (subcutaneous) (surgical) resulting from a procedure 10/2008.    Bleb resected at time of 10/2008 CABG. Marketed emphysema noted on surgical report.  . Stroke ([place])   . GERD (gastroesophageal reflux disease)   . Sleep apnea   . Hyperlipidemia     Past Surgical History  Procedure Laterality Date  . Cardiac catheterization  11/07/2008    EF of 50-55% -- normal LV systolic function, acute inferolateral ST elevation MI,  PTCA of the circumflex artery -- [name]. [name], [name]  . Coronary artery bypass graft    . Left heart catheterization with coronary/graft angiogram N/A 05/15/2013    Procedure: LEFT HEART CATHETERIZATION WITH Beatrix Fetters;   Surgeon: [name], MD;  Location: [facility] CATH LAB;  Service: Cardiovascular;  Laterality: N/A;    Social History   Social History  . Marital Status: Single    Spouse Name: N/A  . Number of Children: 2  . Years of Education: N/A   Occupational History  . not working , used to be a Chief Executive Officer     Social History Main Topics  . Smoking status: Current Every Day Smoker -- 1.00 packs/day for 30 years    Types: Cigarettes    Last Attempt to Quit: 07/11/2015  . Smokeless tobacco: Never Used     Comment: 1 PPD, pt. currently smoking 09/13/15  . Alcohol Use: 0.0 oz/week    0 Standard drinks or equivalent per week     Comment: 2 beers a day - has not had alcohol for the last month  . Drug Use: No     Comment: former users  . Sexual Activity: Not Currently   Other Topics Concern  . Not on file   Social History Narrative   Lives w/ girlfriend         Medication List       This list is accurate as of: 09/30/15  8:57 PM.  Always use your most recent med list.               aspirin 325 MG tablet  Take 1 tablet (325 mg total) by mouth daily.  b complex vitamins tablet  Take 1 tablet by mouth daily.     metoprolol tartrate 25 MG tablet  Commonly known as:  LOPRESSOR  Take 0.5 tablets (12.5 mg total) by mouth 2 (two) times daily.     nitroGLYCERIN 0.4 MG SL tablet  Commonly known as:  NITROSTAT  Place 1 tablet (0.4 mg total) under the tongue every 5 (five) minutes as needed for chest pain.     oxyCODONE-acetaminophen 5-325 MG tablet  Commonly known as:  ROXICET  Take 1 tablet by mouth every 8 (eight) hours as needed for severe pain.     pantoprazole 40 MG tablet  Commonly known as:  PROTONIX  Take 1 tablet (40 mg total) by mouth 2 (two) times daily before a meal.     simvastatin 20 MG tablet  Commonly known as:  ZOCOR  Take 1 tablet (20 mg total) by mouth at bedtime.           Objective:   Physical Exam BP 126/74 mmHg  Pulse 88  Temp(Src) 98.3 F  (36.8 C) (Oral)  Ht 5\' 11"  (1.803 m)  Wt 195 lb 8 oz (88.678 kg)  BMI 27.28 kg/m2  SpO2 97% General:   Well developed, well nourished . NAD. He seems more energetic and happy than  the last time I saw him HEENT:  Normocephalic . Face symmetric, atraumatic Lungs:  CTA B Normal respiratory effort, no intercostal retractions, no accessory muscle use. Heart: RRR,  no murmur.  no pretibial edema bilaterally  Abdomen:  Not distended, soft, non-tender when distracted Skin: Not pale. Not jaundice Neurologic:  alert & oriented X3.  Speech normal, gait appropriate for age and unassisted Psych--  Cognition and judgment appear intact.  Cooperative with normal attention span and concentration.  Behavior appropriate. No anxious or depressed appearing.    Assessment & Plan:   Assessment >  Prediabetes  HTN CV: --CAD: 2010 angioplasty f/u by  CABG 2010, cath 2014 --Stroke 2008, 02-2015 Anxiety depression Snoring, suspect OSA, saw  Pulmonary 2014, no sleep study Abnormal gait GI: Iron def anemia dx 03-2015  ---cscope 09-2015 multiple polyps, repeat in one year ---EGD-2017: Esophagitis, duodenitis, bx done Pancreatitis 06/2015.(EtOH 2 days prior to onset of symptoms) H/o etoh abuse H/o cocaine  Smoker  +FH: father prostate ca , dx late 70s  PLAN Pancreatitis: Continued reporting abdominal pain, pain may be permanent d/t  recurrent etoh pancreatitis . Strongly encouraged him not to go back to alcohol, pain medication provided today, follow-up with GI as recommended. Iron deficiency anemia: Workup reviewed: Cscope 09-2015 multiple polyps, repeat in one year EGD-2017: Esophagitis, duodenitis, BX done Other issues: Patient now has [place], I won't be able to be his primary doctor, recommend to establish with a new PCP ASAP.

## 2015-09-30 NOTE — Progress Notes (Signed)
Pre visit review using our clinic review tool, if applicable. No additional management support is needed unless otherwise documented below in the visit note. 

## 2015-09-30 NOTE — Assessment & Plan Note (Signed)
Pancreatitis: Continued reporting abdominal pain, pain may be permanent d/t  recurrent etoh pancreatitis . Strongly encouraged him not to go back to alcohol, pain medication provided today, follow-up with GI as recommended. Iron deficiency anemia: Workup reviewed: Cscope 09-2015 multiple polyps, repeat in one year EGD-2017: Esophagitis, duodenitis, BX done Other issues: Patient now has [place], I won't be able to be his primary doctor, recommend to establish with a new PCP ASAP.

## 2015-10-23 DIAGNOSIS — G894 Chronic pain syndrome: Secondary | ICD-10-CM | POA: Insufficient documentation

## 2015-10-25 ENCOUNTER — Encounter: Payer: Self-pay | Admitting: *Deleted

## 2015-11-04 ENCOUNTER — Ambulatory Visit: Payer: Medicaid Other | Admitting: Internal Medicine

## 2015-11-15 ENCOUNTER — Telehealth: Payer: Self-pay | Admitting: Internal Medicine

## 2015-11-15 ENCOUNTER — Telehealth: Payer: Self-pay | Admitting: Gastroenterology

## 2015-11-15 DIAGNOSIS — K219 Gastro-esophageal reflux disease without esophagitis: Secondary | ICD-10-CM

## 2015-11-15 DIAGNOSIS — R109 Unspecified abdominal pain: Secondary | ICD-10-CM

## 2015-11-15 DIAGNOSIS — Z8719 Personal history of other diseases of the digestive system: Secondary | ICD-10-CM

## 2015-11-15 NOTE — Telephone Encounter (Signed)
Error

## 2015-11-15 NOTE — Telephone Encounter (Signed)
Dr [name]- Please advise..... Patient is requesting additional oxycodone script. Per Lori's 08/30/15 note, she told patient we would give him a limited supply of medication but that he should get further refills from PCP. On 08/30/15, patient was given more oxycodone as you said it was ok at that time due to recent pancreatitis. It appears patient has gotten rx from a few different providers since then (last rx 10/22/15 from what I can see). Do you want me to keep refilling rx or does he need additional imaging (as per your telephone note)? I have placed info in your inbox in the office as well. Dr Hilarie Fredrickson did last procedure but it appears that you have reviewed patient's info as well. Dr Hilarie Fredrickson is out of town for the week so thought maybe you could help!

## 2015-11-16 NOTE — Telephone Encounter (Signed)
Yes I remember this patient. I believe he was seen as an outpatient initially and assigned to Dr. Hilarie Fredrickson for iron deficiency anemia but he developed pancreatitis in the interim and I saw him at the hospital. He had pancreatitis at the end of December. If he continues to have pain that is requiring narcotics he should have an MRCP to re-evaluate the pancreas and ensure resolution of the fluid collections. I would also recommend his CBC, LFTs, and lipase be repeated as well. He should be receiving narcotics from only one provider, and if his primary care has been providing this he can see them for further prescriptions. He should follow up in our clinic as well for reassessment of this issue but we can order the imaging at this time if his symptoms persist. Thanks

## 2015-11-18 NOTE — Telephone Encounter (Signed)
I have spoken to patient and advised that if he is having persistent pain requiring narcotic medications that he needs evaluation of his pancreas again to make sure the fluid collections previously seen have resolved. Patient states he is still having pain. He states he will come for labs tomorrow. I have scheduled an MRCP for 11/21/15 @ 7 pm. He needs to arrive at 6:45 pm and be NPO 4 hours prior. Patient has been advised of this as well and verbalizes understanding. I have reiterated the importance of getting narcotics from 1 provider only to cut down on confusion and make sure he is being cared for properly. Dr [name], would you have me to wait on rx until after 4/13 MRCP? Thank you so much for your help in Dr Pyrtle's absence.

## 2015-11-18 NOTE — Telephone Encounter (Signed)
Left message for patient to call back  

## 2015-11-18 NOTE — Telephone Encounter (Signed)
If he last received narcotics from his primary care, he should first ask them. If they cannot provide it to him I would authorize a short course to get him through the next 1-2 weeks until we have the MRCP back and review it with him. Thanks for coordinating. Can you also please obtain CBC, CMP, and lipase? Thanks much

## 2015-11-19 ENCOUNTER — Other Ambulatory Visit (INDEPENDENT_AMBULATORY_CARE_PROVIDER_SITE_OTHER): Payer: Medicaid Other

## 2015-11-19 DIAGNOSIS — R109 Unspecified abdominal pain: Secondary | ICD-10-CM

## 2015-11-19 DIAGNOSIS — Z8719 Personal history of other diseases of the digestive system: Secondary | ICD-10-CM | POA: Diagnosis not present

## 2015-11-19 LAB — CBC WITH DIFFERENTIAL/PLATELET
BASOS PCT: 0.3 % (ref 0.0–3.0)
Basophils Absolute: 0 10*3/uL (ref 0.0–0.1)
EOS PCT: 2 % (ref 0.0–5.0)
Eosinophils Absolute: 0.2 10*3/uL (ref 0.0–0.7)
HCT: 46.7 % (ref 39.0–52.0)
HEMOGLOBIN: 15.4 g/dL (ref 13.0–17.0)
LYMPHS ABS: 1.9 10*3/uL (ref 0.7–4.0)
Lymphocytes Relative: 21.5 % (ref 12.0–46.0)
MCHC: 32.9 g/dL (ref 30.0–36.0)
MCV: 87.4 fl (ref 78.0–100.0)
MONO ABS: 1 10*3/uL (ref 0.1–1.0)
Monocytes Relative: 11.2 % (ref 3.0–12.0)
NEUTROS PCT: 65 % (ref 43.0–77.0)
Neutro Abs: 5.8 10*3/uL (ref 1.4–7.7)
Platelets: 206 10*3/uL (ref 150.0–400.0)
RBC: 5.34 Mil/uL (ref 4.22–5.81)
RDW: 23.6 % — AB (ref 11.5–15.5)
WBC: 8.9 10*3/uL (ref 4.0–10.5)

## 2015-11-19 LAB — HEPATIC FUNCTION PANEL
ALT: 11 U/L (ref 0–53)
AST: 14 U/L (ref 0–37)
Albumin: 4.1 g/dL (ref 3.5–5.2)
Alkaline Phosphatase: 78 U/L (ref 39–117)
BILIRUBIN TOTAL: 0.6 mg/dL (ref 0.2–1.2)
Bilirubin, Direct: 0.1 mg/dL (ref 0.0–0.3)
Total Protein: 7.6 g/dL (ref 6.0–8.3)

## 2015-11-19 LAB — LIPASE: LIPASE: 25 U/L (ref 11.0–59.0)

## 2015-11-19 NOTE — Telephone Encounter (Signed)
Please let the patient know his labs are normal.' We will await MRCP. I will give him a short course refill of narcotic if his pain is that severe but he needs to establish a PCM to see, I will not prescribe long term narcotics for him. thanks

## 2015-11-19 NOTE — Telephone Encounter (Addendum)
Patient told me yesterday he would come for labs today. He states that he in no longer to see the Dr he has been seeing for quite sometime since he is now getting Medicaid. His last appointment was with a physician in [place] who gave him a 1 time script and told him he would not be able to fill any further narcotic scripts.

## 2015-11-19 NOTE — Telephone Encounter (Signed)
Patient stopped by after his labs. Says he had been talking to [place] here in our office. He is aware Dr she was going to call him back but since he was already in the building decided to come up instead. Advised patient Cecille Rubin or maybe Carla Drape will call him back when she hears back from physician.

## 2015-11-20 MED ORDER — OXYCODONE-ACETAMINOPHEN 5-325 MG PO TABS: 1.0000 | ORAL_TABLET | Freq: Three times a day (TID) | ORAL | Status: DC | PRN

## 2015-11-20 NOTE — Telephone Encounter (Signed)
Since Dr [name] is hospital doctor and not at the office this week, Kenneth Bogus, PA-C has agreed to write rx for oxycodone as okayed by Dr [name]. Patient has been advised that rx may be picked up today, that labs are normal and that he should keep MRCP appointment. He is also advised that Dr [name] will not write for long term narcotics and no more narcotic medication will be written for him from this office. I advised that he discuss medications with his primary care doctor if he continues to need these medications. He verbalizes understanding.

## 2015-11-21 ENCOUNTER — Ambulatory Visit (HOSPITAL_COMMUNITY)
Admission: RE | Admit: 2015-11-21 | Discharge: 2015-11-21 | Disposition: A | Discharge status: Home / Self Care | Payer: Medicaid Other | Source: Ambulatory Visit | Attending: Gastroenterology | Admitting: Gastroenterology

## 2015-11-21 ENCOUNTER — Other Ambulatory Visit: Payer: Self-pay | Admitting: Gastroenterology

## 2015-11-21 DIAGNOSIS — Z8719 Personal history of other diseases of the digestive system: Secondary | ICD-10-CM | POA: Diagnosis not present

## 2015-11-21 DIAGNOSIS — R109 Unspecified abdominal pain: Secondary | ICD-10-CM | POA: Insufficient documentation

## 2015-11-21 LAB — POCT I-STAT CREATININE: Creatinine, Ser: 0.8 mg/dL (ref 0.61–1.24)

## 2015-11-27 ENCOUNTER — Telehealth: Payer: Self-pay | Admitting: *Deleted

## 2015-11-27 NOTE — Telephone Encounter (Signed)
I contacted MRI @ [facility] to find out why MRCP was not completed on 11/21/15. Tech states that all she can tell is that the exam was attempted but not completed. She does not know why. I asked if anyone had documented the reasoning the test wasn't completed and she told me no documentation was done. No one called our office to advise of this either. I have left a voicemail for patient to call back.

## 2015-11-27 NOTE — Telephone Encounter (Signed)
-----   Message from Hulan Saas, RN sent at 11/27/2015  8:50 AM EDT -----   ----- Message -----    From: Manus Gunning, MD    Sent: 11/27/2015   8:03 AM      To: Hulan Saas, RN  [place]ina this patient's MRCP apparently was cancelled or did not occur as scheduled. Would you mind calling him to see if he needs to be rescheduled for it? Thanks  ----- Message -----    From: SYSTEM    Sent: 11/26/2015  12:05 AM      To: Manus Gunning, MD

## 2015-11-28 ENCOUNTER — Other Ambulatory Visit: Payer: Self-pay | Admitting: Gastroenterology

## 2015-11-28 DIAGNOSIS — R109 Unspecified abdominal pain: Secondary | ICD-10-CM

## 2015-11-28 DIAGNOSIS — Z8719 Personal history of other diseases of the digestive system: Secondary | ICD-10-CM

## 2015-11-28 NOTE — Telephone Encounter (Signed)
Left voicemail for patient to call back. 

## 2015-11-28 NOTE — Telephone Encounter (Signed)
Patient states that he was unable to have the MRCP because he became too claustrophobic while in the scanner at [facility]. He states he did okay in the [facility] MRI machine. I have rescheduled patient's MRCP to [facility] on tomorrow, 11/29/15 @ 11:00 am. Patient verbalizes understanding of this.

## 2015-11-29 ENCOUNTER — Telehealth: Payer: Self-pay | Admitting: *Deleted

## 2015-11-29 ENCOUNTER — Ambulatory Visit (HOSPITAL_COMMUNITY)
Admission: RE | Admit: 2015-11-29 | Discharge: 2015-11-29 | Disposition: A | Discharge status: Home / Self Care | Payer: Medicaid Other | Source: Ambulatory Visit | Attending: Gastroenterology | Admitting: Gastroenterology

## 2015-11-29 DIAGNOSIS — R109 Unspecified abdominal pain: Secondary | ICD-10-CM

## 2015-11-29 DIAGNOSIS — Z8719 Personal history of other diseases of the digestive system: Secondary | ICD-10-CM

## 2015-11-29 NOTE — Telephone Encounter (Signed)
Received a voice mail from [place] that patient could not do the MRI today because of his stints and he is claustrophobic. He can only do the 1.75 MRI because of the stints. Patient would need something to calm him down.

## 2015-11-29 NOTE — Telephone Encounter (Signed)
See previous documentation 11/27/15. I spoke to [place] at [facility] radiology. She states that the patient is claustrophobic and is unable to go forward with 1.5 TESLA MRI at [facility]. Unfortunately, the open scanner at [facility] is a 3 TESLA and is too high risk for patient since he has multiple stents without clear documentation of the type of each stent. She states that patient would only be able to have 1.5 TESLA scan. However, due to the claustrophobia, patient would need something to relax him for the test. He is not able to be sedated as he will have to do breathing exercises for the test. Dr Hilarie Fredrickson (patient's primary GI) will be notified of this and a decision will be made after he makes recommendations.

## 2015-11-29 NOTE — Telephone Encounter (Signed)
Per Dr Vena Rua verbal orders, since patient has not been able to have MRCP, we will go ahead with CT abd/pelvis with pancreatic protocol to take another look at the pancreas. I have left a voicemail for patient to call back.

## 2015-12-02 NOTE — Telephone Encounter (Signed)
Left voicemail for patient to call back. 

## 2015-12-03 MED ORDER — LORAZEPAM 0.5 MG PO TABS: 0.5000 mg | ORAL_TABLET | Freq: Once | ORAL | Status: DC

## 2015-12-03 NOTE — Addendum Note (Signed)
Addended by: Larina Bras on: 12/03/2015 04:35 PM   Modules accepted: Orders

## 2015-12-03 NOTE — Telephone Encounter (Signed)
If felt needed, he can take ativan 0.5 mg 1 hour before the test, but CT is not as enclosed as MRI If he takes this mild sedative he should have someone else provide transportation to and from his test

## 2015-12-03 NOTE — Telephone Encounter (Addendum)
I have spoken to [place] at [facility] Radiology. She asked that we schedule patient for CT angiogram abdomen/pelvis with pancreatic protocol. I have spoken to Dr Hilarie Fredrickson and he does not think angiogram is needed. He would like CT abdomen/pelvis. I have again spoken to [place] who states that CT abd/pelvis with and without contrast with pancreatic protocol should cover the test. Patient has been scheduled for 12/12/15 @ 3:00 pm at [facility] Radiology. Patient will need to pick up oral contrast. Dr Hilarie Fredrickson, since patient has had a difficult time with claustrophobia recently while trying to go forward with imaging studies, would you like me to give him a 1 time does of an anti-anxiety medication or do you think he is okay not to have anything beforehand?

## 2015-12-03 NOTE — Addendum Note (Signed)
Addended by: Larina Bras on: 12/03/2015 01:12 PM   Modules accepted: Orders

## 2015-12-03 NOTE — Telephone Encounter (Signed)
Left voicemail for patient to call back. 

## 2015-12-03 NOTE — Addendum Note (Signed)
Addended by: Larina Bras on: 12/03/2015 03:07 PM   Modules accepted: Orders

## 2015-12-03 NOTE — Addendum Note (Signed)
Addended by: Larina Bras on: 12/03/2015 03:10 PM   Modules accepted: Orders

## 2015-12-03 NOTE — Telephone Encounter (Signed)
I have spoken to patient and have advised of information below. He will go ahead with the CT scan and is advised of time/date/location of test as well as to be NPO 4 hours prior and drink oral contrast at 1 pm and 2 pm (he will pick contrast up at our office prior to 12/12/15). He will also get the 1 dose of ativan to take prior to CT just so he is 100% sure he can have test. He has been advised that he will need a driver if he takes the ativan and he relays that he will have someone to drive him for appointment.

## 2015-12-11 ENCOUNTER — Telehealth: Payer: Self-pay | Admitting: *Deleted

## 2015-12-11 DIAGNOSIS — Z8719 Personal history of other diseases of the digestive system: Secondary | ICD-10-CM

## 2015-12-11 DIAGNOSIS — R109 Unspecified abdominal pain: Secondary | ICD-10-CM

## 2015-12-11 NOTE — Addendum Note (Signed)
Addended by: Larina Bras on: 12/11/2015 02:29 PM   Modules accepted: Orders

## 2015-12-11 NOTE — Telephone Encounter (Signed)
Dr Hilarie Fredrickson has spoken to [employer] (company used by [employer] for imaging prior authorization) for peer to peer review for CT abdomen/pelvis. Dr Burt Knack has approved the CT abdomen and will send a fax with the approval. The CT pelvis has been scheduled with [facility] Radiology.

## 2015-12-12 ENCOUNTER — Ambulatory Visit (HOSPITAL_COMMUNITY)
Admission: RE | Admit: 2015-12-12 | Discharge: 2015-12-12 | Disposition: A | Discharge status: Home / Self Care | Payer: Medicaid Other | Source: Ambulatory Visit | Attending: Internal Medicine | Admitting: Internal Medicine

## 2015-12-12 ENCOUNTER — Ambulatory Visit (HOSPITAL_COMMUNITY): Payer: Medicaid Other

## 2015-12-12 ENCOUNTER — Encounter (HOSPITAL_COMMUNITY): Payer: Self-pay

## 2015-12-12 DIAGNOSIS — R109 Unspecified abdominal pain: Secondary | ICD-10-CM | POA: Insufficient documentation

## 2015-12-12 DIAGNOSIS — K449 Diaphragmatic hernia without obstruction or gangrene: Secondary | ICD-10-CM | POA: Diagnosis not present

## 2015-12-12 DIAGNOSIS — Z8719 Personal history of other diseases of the digestive system: Secondary | ICD-10-CM | POA: Diagnosis not present

## 2015-12-12 DIAGNOSIS — K862 Cyst of pancreas: Secondary | ICD-10-CM | POA: Diagnosis not present

## 2015-12-12 DIAGNOSIS — I700 Atherosclerosis of aorta: Secondary | ICD-10-CM | POA: Insufficient documentation

## 2015-12-12 MED ORDER — IOPAMIDOL (ISOVUE-300) INJECTION 61%
INTRAVENOUS | Status: AC
  Administered 2015-12-12: 100 mL
  Filled 2015-12-12: qty 100

## 2016-01-10 ENCOUNTER — Ambulatory Visit: Payer: Medicaid Other | Admitting: Internal Medicine

## 2016-03-09 ENCOUNTER — Ambulatory Visit: Payer: Medicaid Other | Admitting: Internal Medicine

## 2016-03-09 ENCOUNTER — Telehealth: Payer: Self-pay | Admitting: *Deleted

## 2016-03-09 NOTE — Telephone Encounter (Signed)
No show letter mailed to patient. 

## 2016-07-07 ENCOUNTER — Inpatient Hospital Stay (HOSPITAL_COMMUNITY)
Admission: RE | Admit: 2016-07-07 | Discharge: 2016-07-09 | DRG: 246 | Disposition: A | Discharge status: Home / Self Care | Payer: Medicaid Other | Source: Ambulatory Visit | Attending: Cardiology | Admitting: Cardiology

## 2016-07-07 ENCOUNTER — Encounter (HOSPITAL_COMMUNITY)
Admission: RE | Disposition: A | Discharge status: Home / Self Care | Payer: Self-pay | Source: Ambulatory Visit | Attending: Cardiology

## 2016-07-07 ENCOUNTER — Encounter (HOSPITAL_COMMUNITY): Payer: Self-pay | Admitting: Nurse Practitioner

## 2016-07-07 DIAGNOSIS — Z79899 Other long term (current) drug therapy: Secondary | ICD-10-CM

## 2016-07-07 DIAGNOSIS — Z720 Tobacco use: Secondary | ICD-10-CM | POA: Diagnosis not present

## 2016-07-07 DIAGNOSIS — Z951 Presence of aortocoronary bypass graft: Secondary | ICD-10-CM | POA: Diagnosis not present

## 2016-07-07 DIAGNOSIS — Y832 Surgical operation with anastomosis, bypass or graft as the cause of abnormal reaction of the patient, or of later complication, without mention of misadventure at the time of the procedure: Secondary | ICD-10-CM | POA: Diagnosis present

## 2016-07-07 DIAGNOSIS — I2510 Atherosclerotic heart disease of native coronary artery without angina pectoris: Secondary | ICD-10-CM | POA: Diagnosis present

## 2016-07-07 DIAGNOSIS — R079 Chest pain, unspecified: Secondary | ICD-10-CM | POA: Diagnosis present

## 2016-07-07 DIAGNOSIS — I252 Old myocardial infarction: Secondary | ICD-10-CM

## 2016-07-07 DIAGNOSIS — Z9119 Patient's noncompliance with other medical treatment and regimen: Secondary | ICD-10-CM | POA: Diagnosis not present

## 2016-07-07 DIAGNOSIS — Z7982 Long term (current) use of aspirin: Secondary | ICD-10-CM | POA: Diagnosis not present

## 2016-07-07 DIAGNOSIS — I472 Ventricular tachycardia: Secondary | ICD-10-CM | POA: Diagnosis not present

## 2016-07-07 DIAGNOSIS — K219 Gastro-esophageal reflux disease without esophagitis: Secondary | ICD-10-CM | POA: Diagnosis present

## 2016-07-07 DIAGNOSIS — I2119 ST elevation (STEMI) myocardial infarction involving other coronary artery of inferior wall: Secondary | ICD-10-CM | POA: Diagnosis present

## 2016-07-07 DIAGNOSIS — I5032 Chronic diastolic (congestive) heart failure: Secondary | ICD-10-CM | POA: Diagnosis present

## 2016-07-07 DIAGNOSIS — Z86010 Personal history of colonic polyps: Secondary | ICD-10-CM

## 2016-07-07 DIAGNOSIS — T82898A Other specified complication of vascular prosthetic devices, implants and grafts, initial encounter: Principal | ICD-10-CM | POA: Diagnosis present

## 2016-07-07 DIAGNOSIS — I110 Hypertensive heart disease with heart failure: Secondary | ICD-10-CM | POA: Diagnosis present

## 2016-07-07 DIAGNOSIS — G4730 Sleep apnea, unspecified: Secondary | ICD-10-CM | POA: Diagnosis present

## 2016-07-07 DIAGNOSIS — F17210 Nicotine dependence, cigarettes, uncomplicated: Secondary | ICD-10-CM | POA: Diagnosis present

## 2016-07-07 DIAGNOSIS — Z8673 Personal history of transient ischemic attack (TIA), and cerebral infarction without residual deficits: Secondary | ICD-10-CM | POA: Diagnosis not present

## 2016-07-07 DIAGNOSIS — I4901 Ventricular fibrillation: Secondary | ICD-10-CM | POA: Diagnosis not present

## 2016-07-07 DIAGNOSIS — I2121 ST elevation (STEMI) myocardial infarction involving left circumflex coronary artery: Secondary | ICD-10-CM | POA: Diagnosis not present

## 2016-07-07 DIAGNOSIS — Z955 Presence of coronary angioplasty implant and graft: Secondary | ICD-10-CM

## 2016-07-07 DIAGNOSIS — Z6830 Body mass index (BMI) 30.0-30.9, adult: Secondary | ICD-10-CM

## 2016-07-07 DIAGNOSIS — E785 Hyperlipidemia, unspecified: Secondary | ICD-10-CM | POA: Diagnosis present

## 2016-07-07 HISTORY — PX: CARDIAC CATHETERIZATION: SHX172

## 2016-07-07 HISTORY — DX: Polyp of colon: K63.5

## 2016-07-07 HISTORY — DX: Esophagitis, unspecified without bleeding: K20.90

## 2016-07-07 HISTORY — DX: Alcohol abuse, uncomplicated: F10.10

## 2016-07-07 HISTORY — DX: Tobacco use: Z72.0

## 2016-07-07 HISTORY — DX: Atherosclerotic heart disease of native coronary artery without angina pectoris: I25.10

## 2016-07-07 HISTORY — DX: Morbid (severe) obesity due to excess calories: E66.01

## 2016-07-07 HISTORY — DX: Esophagitis, unspecified: K20.9

## 2016-07-07 HISTORY — DX: Chronic diastolic (congestive) heart failure: I50.32

## 2016-07-07 LAB — COMPREHENSIVE METABOLIC PANEL
ALBUMIN: 3.5 g/dL (ref 3.5–5.0)
ALT: 20 U/L (ref 17–63)
ANION GAP: 9 (ref 5–15)
AST: 22 U/L (ref 15–41)
Alkaline Phosphatase: 79 U/L (ref 38–126)
BUN: 10 mg/dL (ref 6–20)
CHLORIDE: 102 mmol/L (ref 101–111)
CO2: 24 mmol/L (ref 22–32)
Calcium: 8.6 mg/dL — ABNORMAL LOW (ref 8.9–10.3)
Creatinine, Ser: 0.85 mg/dL (ref 0.61–1.24)
GFR calc non Af Amer: >60 mL/min (ref >60)
Glucose, Bld: 126 mg/dL — ABNORMAL HIGH (ref 65–99)
Potassium: 3.8 mmol/L (ref 3.5–5.1)
SODIUM: 135 mmol/L (ref 135–145)
Total Bilirubin: 1 mg/dL (ref 0.3–1.2)
Total Protein: 7 g/dL (ref 6.5–8.1)

## 2016-07-07 LAB — CBC
HCT: 42.3 % (ref 39.0–52.0)
Hemoglobin: 13.3 g/dL (ref 13.0–17.0)
MCH: 26.5 pg (ref 26.0–34.0)
MCHC: 31.4 g/dL (ref 30.0–36.0)
MCV: 84.4 fL (ref 78.0–100.0)
PLATELETS: 238 10*3/uL (ref 150–400)
RBC: 5.01 MIL/uL (ref 4.22–5.81)
RDW: 18.2 % — AB (ref 11.5–15.5)
WBC: 12 10*3/uL — ABNORMAL HIGH (ref 4.0–10.5)

## 2016-07-07 LAB — LIPID PANEL
CHOL/HDL RATIO: 4.3 ratio
Cholesterol: 168 mg/dL (ref 0–200)
HDL: 39 mg/dL — AB (ref >40)
LDL CALC: 104 mg/dL — AB (ref 0–99)
TRIGLYCERIDES: 126 mg/dL (ref <150)
VLDL: 25 mg/dL (ref 0–40)

## 2016-07-07 LAB — PROTIME-INR
INR: 1.1
PROTHROMBIN TIME: 14.3 s (ref 11.4–15.2)

## 2016-07-07 LAB — APTT: APTT: 30 s (ref 24–36)

## 2016-07-07 LAB — POCT ACTIVATED CLOTTING TIME: ACTIVATED CLOTTING TIME: 665 s

## 2016-07-07 LAB — TROPONIN I
TROPONIN I: 47.49 ng/mL — AB (ref <0.03)
Troponin I: 9.49 ng/mL (ref <0.03)

## 2016-07-07 LAB — TSH: TSH: 1.895 u[IU]/mL (ref 0.350–4.500)

## 2016-07-07 SURGERY — LEFT HEART CATH AND CORS/GRAFTS ANGIOGRAPHY
Anesthesia: LOCAL

## 2016-07-07 MED ORDER — ASPIRIN EC 81 MG PO TBEC: 81.0000 mg | DELAYED_RELEASE_TABLET | Freq: Every day | ORAL | Status: DC

## 2016-07-07 MED ORDER — LABETALOL HCL 5 MG/ML IV SOLN: 10.0000 mg | INTRAVENOUS | Status: DC | PRN

## 2016-07-07 MED ORDER — SODIUM CHLORIDE 0.9% FLUSH
3.0000 mL | Freq: Two times a day (BID) | INTRAVENOUS | Status: DC
  Administered 2016-07-08: 3 mL via INTRAVENOUS

## 2016-07-07 MED ORDER — HEPARIN SODIUM (PORCINE) 1000 UNIT/ML IJ SOLN
INTRAMUSCULAR | Status: AC
  Filled 2016-07-07: qty 1

## 2016-07-07 MED ORDER — LABETALOL HCL 5 MG/ML IV SOLN
10.0000 mg | INTRAVENOUS | Status: AC | PRN
  Administered 2016-07-07: 10 mg via INTRAVENOUS
  Filled 2016-07-07: qty 4

## 2016-07-07 MED ORDER — SODIUM CHLORIDE 0.9% FLUSH: 3.0000 mL | INTRAVENOUS | Status: DC | PRN

## 2016-07-07 MED ORDER — ADULT MULTIVITAMIN W/MINERALS CH
1.0000 | ORAL_TABLET | Freq: Every day | ORAL | Status: DC
  Administered 2016-07-08 – 2016-07-09 (×2): 1 via ORAL
  Filled 2016-07-07 (×2): qty 1

## 2016-07-07 MED ORDER — ATROPINE SULFATE 1 MG/10ML IJ SOSY
PREFILLED_SYRINGE | INTRAMUSCULAR | Status: AC
  Filled 2016-07-07: qty 10

## 2016-07-07 MED ORDER — ATORVASTATIN CALCIUM 80 MG PO TABS
80.0000 mg | ORAL_TABLET | Freq: Every day | ORAL | Status: DC
  Administered 2016-07-07 – 2016-07-08 (×2): 80 mg via ORAL
  Filled 2016-07-07 (×2): qty 1

## 2016-07-07 MED ORDER — TICAGRELOR 90 MG PO TABS
90.0000 mg | ORAL_TABLET | Freq: Two times a day (BID) | ORAL | Status: DC
  Administered 2016-07-07 – 2016-07-08 (×3): 90 mg via ORAL
  Filled 2016-07-07 (×4): qty 1

## 2016-07-07 MED ORDER — SODIUM CHLORIDE 0.9 % IV SOLN: INTRAVENOUS | Status: AC

## 2016-07-07 MED ORDER — B COMPLEX PO TABS: 1.0000 | ORAL_TABLET | Freq: Every day | ORAL | Status: DC

## 2016-07-07 MED ORDER — LIDOCAINE HCL (PF) 1 % IJ SOLN
INTRAMUSCULAR | Status: DC | PRN
  Administered 2016-07-07: 15 mL

## 2016-07-07 MED ORDER — SODIUM CHLORIDE 0.9 % IV SOLN
INTRAVENOUS | Status: DC | PRN
  Administered 2016-07-07: 1.75 mg/kg/h via INTRAVENOUS

## 2016-07-07 MED ORDER — TICAGRELOR 90 MG PO TABS
ORAL_TABLET | ORAL | Status: DC | PRN
  Administered 2016-07-07: 180 mg via ORAL

## 2016-07-07 MED ORDER — TICAGRELOR 90 MG PO TABS
ORAL_TABLET | ORAL | Status: AC
  Filled 2016-07-07: qty 2

## 2016-07-07 MED ORDER — ACETAMINOPHEN 325 MG PO TABS: 650.0000 mg | ORAL_TABLET | ORAL | Status: DC | PRN

## 2016-07-07 MED ORDER — HEPARIN (PORCINE) IN NACL 2-0.9 UNIT/ML-% IJ SOLN
INTRAMUSCULAR | Status: AC
  Filled 2016-07-07: qty 500

## 2016-07-07 MED ORDER — HEPARIN (PORCINE) IN NACL 2-0.9 UNIT/ML-% IJ SOLN
INTRAMUSCULAR | Status: DC | PRN
Start: 2016-07-07 — End: 2016-07-07
  Administered 2016-07-07: 1500 mL

## 2016-07-07 MED ORDER — IOPAMIDOL (ISOVUE-370) INJECTION 76%
INTRAVENOUS | Status: AC
  Filled 2016-07-07: qty 50

## 2016-07-07 MED ORDER — ASPIRIN 81 MG PO CHEW
81.0000 mg | CHEWABLE_TABLET | Freq: Every day | ORAL | Status: DC
  Administered 2016-07-07 – 2016-07-09 (×3): 81 mg via ORAL
  Filled 2016-07-07 (×3): qty 1

## 2016-07-07 MED ORDER — ONDANSETRON HCL 4 MG/2ML IJ SOLN: 4.0000 mg | Freq: Four times a day (QID) | INTRAMUSCULAR | Status: DC | PRN

## 2016-07-07 MED ORDER — HEPARIN (PORCINE) IN NACL 2-0.9 UNIT/ML-% IJ SOLN
INTRAMUSCULAR | Status: AC
  Filled 2016-07-07: qty 1000

## 2016-07-07 MED ORDER — ALPRAZOLAM 0.5 MG PO TABS
0.5000 mg | ORAL_TABLET | Freq: Once | ORAL | Status: AC
  Administered 2016-07-07: 0.5 mg via ORAL
  Filled 2016-07-07: qty 1

## 2016-07-07 MED ORDER — HEPARIN SODIUM (PORCINE) 1000 UNIT/ML IJ SOLN
INTRAMUSCULAR | Status: DC | PRN
  Administered 2016-07-07: 4000 [IU] via INTRAVENOUS

## 2016-07-07 MED ORDER — ONDANSETRON HCL 4 MG/2ML IJ SOLN
4.0000 mg | Freq: Four times a day (QID) | INTRAMUSCULAR | Status: DC | PRN
Start: 2016-07-07 — End: 2016-07-07

## 2016-07-07 MED ORDER — SODIUM CHLORIDE 0.9% FLUSH
3.0000 mL | Freq: Two times a day (BID) | INTRAVENOUS | Status: DC
  Administered 2016-07-07: 3 mL via INTRAVENOUS

## 2016-07-07 MED ORDER — PANTOPRAZOLE SODIUM 40 MG PO TBEC
40.0000 mg | DELAYED_RELEASE_TABLET | Freq: Two times a day (BID) | ORAL | Status: DC
  Administered 2016-07-07 – 2016-07-09 (×4): 40 mg via ORAL
  Filled 2016-07-07 (×4): qty 1

## 2016-07-07 MED ORDER — BIVALIRUDIN 250 MG IV SOLR
INTRAVENOUS | Status: AC
  Filled 2016-07-07: qty 250

## 2016-07-07 MED ORDER — SODIUM CHLORIDE 0.9 % IV SOLN: 250.0000 mL | INTRAVENOUS | Status: DC | PRN

## 2016-07-07 MED ORDER — IOPAMIDOL (ISOVUE-370) INJECTION 76%
INTRAVENOUS | Status: DC | PRN
  Administered 2016-07-07: 145 mL via INTRA_ARTERIAL

## 2016-07-07 MED ORDER — METOPROLOL TARTRATE 12.5 MG HALF TABLET
12.5000 mg | ORAL_TABLET | Freq: Two times a day (BID) | ORAL | Status: DC
  Administered 2016-07-07: 12.5 mg via ORAL
  Filled 2016-07-07: qty 1

## 2016-07-07 MED ORDER — TICAGRELOR 90 MG PO TABS: 90.0000 mg | ORAL_TABLET | Freq: Two times a day (BID) | ORAL | Status: DC

## 2016-07-07 MED ORDER — ASPIRIN 81 MG PO CHEW: 81.0000 mg | CHEWABLE_TABLET | Freq: Every day | ORAL | Status: DC

## 2016-07-07 MED ORDER — SODIUM CHLORIDE 0.9% FLUSH
3.0000 mL | Freq: Two times a day (BID) | INTRAVENOUS | Status: DC
  Administered 2016-07-08 (×2): 3 mL via INTRAVENOUS

## 2016-07-07 MED ORDER — NITROGLYCERIN 1 MG/10 ML FOR IR/CATH LAB
INTRA_ARTERIAL | Status: AC
  Filled 2016-07-07: qty 10

## 2016-07-07 MED ORDER — HYDRALAZINE HCL 20 MG/ML IJ SOLN: 5.0000 mg | INTRAMUSCULAR | Status: DC | PRN

## 2016-07-07 MED ORDER — IOPAMIDOL (ISOVUE-370) INJECTION 76%
INTRAVENOUS | Status: AC
  Filled 2016-07-07: qty 125

## 2016-07-07 MED ORDER — ENOXAPARIN SODIUM 40 MG/0.4ML ~~LOC~~ SOLN
40.0000 mg | SUBCUTANEOUS | Status: DC
  Filled 2016-07-07: qty 0.4

## 2016-07-07 MED ORDER — HYDRALAZINE HCL 20 MG/ML IJ SOLN: 5.0000 mg | INTRAMUSCULAR | Status: AC | PRN

## 2016-07-07 MED ORDER — NITROGLYCERIN 0.4 MG SL SUBL: 0.4000 mg | SUBLINGUAL_TABLET | SUBLINGUAL | Status: DC | PRN

## 2016-07-07 MED ORDER — LIDOCAINE HCL (PF) 1 % IJ SOLN
INTRAMUSCULAR | Status: AC
  Filled 2016-07-07: qty 30

## 2016-07-07 MED ORDER — ONDANSETRON HCL 4 MG/2ML IJ SOLN
4.0000 mg | Freq: Four times a day (QID) | INTRAMUSCULAR | Status: DC | PRN
  Filled 2016-07-07: qty 2

## 2016-07-07 MED ORDER — ENOXAPARIN SODIUM 40 MG/0.4ML ~~LOC~~ SOLN
40.0000 mg | SUBCUTANEOUS | Status: DC
  Administered 2016-07-08 – 2016-07-09 (×2): 40 mg via SUBCUTANEOUS
  Filled 2016-07-07 (×3): qty 0.4

## 2016-07-07 MED ORDER — BIVALIRUDIN BOLUS VIA INFUSION - CUPID
INTRAVENOUS | Status: DC | PRN
  Administered 2016-07-07: 69.75 mg via INTRAVENOUS

## 2016-07-07 MED ORDER — ZOLPIDEM TARTRATE 5 MG PO TABS
5.0000 mg | ORAL_TABLET | Freq: Every evening | ORAL | Status: DC | PRN
Start: 2016-07-07 — End: 2016-07-09
  Administered 2016-07-07 – 2016-07-08 (×2): 5 mg via ORAL
  Filled 2016-07-07 (×2): qty 1

## 2016-07-07 SURGICAL SUPPLY — 19 items
BALLN EUPHORA RX 2.25X12 (BALLOONS) ×2
BALLOON EUPHORA RX 2.25X12 (BALLOONS) ×1 IMPLANT
CATH INFINITI 5FR MULTPACK ANG (CATHETERS) ×2 IMPLANT
CATH LAUNCHER 5F IMA (CATHETERS) ×1 IMPLANT
CATHETER LAUNCHER 5F IMA (CATHETERS) ×2
GLIDESHEATH SLEND SS 6F .021 (SHEATH) ×2 IMPLANT
GUIDE CATH RUNWAY 6FR AL 1 (CATHETERS) ×2 IMPLANT
KIT ENCORE 26 ADVANTAGE (KITS) ×2 IMPLANT
KIT HEART LEFT (KITS) ×2 IMPLANT
PACK CARDIAC CATHETERIZATION (CUSTOM PROCEDURE TRAY) ×2 IMPLANT
SHEATH PINNACLE 6F 10CM (SHEATH) IMPLANT
STENT SYNERGY DES 4X20 (Permanent Stent) ×2 IMPLANT
SYR MEDRAD MARK V 150ML (SYRINGE) ×2 IMPLANT
TRANSDUCER W/STOPCOCK (MISCELLANEOUS) ×2 IMPLANT
TUBING CIL FLEX 10 FLL-RA (TUBING) ×2 IMPLANT
WIRE ASAHI PROWATER 180CM (WIRE) ×2 IMPLANT
WIRE EMERALD 3MM-J .035X150CM (WIRE) ×2 IMPLANT
WIRE EMERALD 3MM-J .035X260CM (WIRE) ×2 IMPLANT
WIRE HI TORQ VERSACORE-J 145CM (WIRE) ×2 IMPLANT

## 2016-07-07 NOTE — Progress Notes (Signed)
[facility] Progress Note Patient Name: [name]. DOB: 1961/04/17 MRN: [id number]   Date of Service  07/07/2016  HPI/Events of Note  New arrival on unit, patient appears stable, no distress, hemodynamically stable. On RA, appears comfortable.   eICU Interventions  No acute intervention at this time, will continue to monitor.         Laverle Hobby 07/07/2016, 6:07 PM

## 2016-07-07 NOTE — Progress Notes (Signed)
Patients home medications given to daughter to take home.

## 2016-07-07 NOTE — Progress Notes (Signed)
CRITICAL VALUE ALERT  Critical value received:  Troponin 9.49  Date of notification:  07/07/2016  Time of notification:  2007  Critical value read back:Yes.    Nurse who received alert:  Hadassah Pais RN   MD notified (1st page):  Dr. Eula Fried  Time of first page:  2010  MD notified (2nd page): Dr. Eula Fried  Time of second page: 2030  Responding MD:  Dr. Eula Fried  Time MD responded: 2040

## 2016-07-07 NOTE — Progress Notes (Signed)
Right groin sheath pulled at 2145. Pressure held for 20 minutes. Pt tolerated well. Vital signs remained stable. Pressure dressing applied and pt instructed to apply pressure when coughing or sneezing. Pt provided teach back. Will continue to monitor.

## 2016-07-07 NOTE — H&P (Signed)
History & Physical    Patient ID: [name] MRN: [id number], DOB/AGE: 53/15/1962   Admit date: 07/07/2016   Primary Physician: No primary care provider on file. Primary Cardiologist: C. Angelena Form, MD   Patient Profile    55 y /o ? with a h/o CAD s/p prior CABG who presents with acute STEMI.  Past Medical History    Past Medical History:  Diagnosis Date  . Abnormality of gait   . CAD (coronary artery disease)    a. 10/2008 Inferolateral STEMI: 3VD w/ occluded LCX (PTCA)-->CABG x 3 (LIMA->LAD, VG->OM, VG->PDA);  b. 06/2009 NSTEMI/PCI: VG->OM 100 (BMS);  c. 05/2013 native 3VD, 3/3 patent grafts.  . Chronic diastolic CHF (congestive heart failure) ([place])    a. 02/2015 Echo: EF 50-55%, distal septal, apical, inferobasal HK, mild LVH, mild MR, mildly dil LA.  [name] Cocaine use   . Colon polyps    a. 09/2015 colonscopy: multiple sessile polyps, path negative for high grade dysplasia.  . Emphysema (subcutaneous) (surgical) resulting from a procedure 10/2008.   Bleb resected at time of 10/2008 CABG. Marketed emphysema noted on surgical report.  . Esophagitis    a. 2017 EGD: esophagitis, duodenitis.  [name] ETOH abuse   . GERD (gastroesophageal reflux disease)   . Hepatic steatosis   . Hiatal hernia   . Hyperlipidemia   . Hypertension   . Iron deficiency anemia 2010  . Morbid obesity ([place])   . Pancreatitis 06/2015  . Sleep apnea   . Stroke ([place]) 02/2015   a. 02/2015 Carotid U/S: 1-39% bilat ICA stenosis.  . Tobacco abuse    still smokes  . Tubular adenoma of colon     Past Surgical History:  Procedure Laterality Date  . CARDIAC CATHETERIZATION  11/07/2008   EF of 50-55% -- normal LV systolic function, acute inferolateral ST elevation MI,  PTCA of the circumflex artery -- [name]. [name], [name]  . CORONARY ARTERY BYPASS GRAFT    . LEFT HEART CATHETERIZATION WITH CORONARY/GRAFT ANGIOGRAM N/A 05/15/2013   Procedure: LEFT HEART CATHETERIZATION WITH Beatrix Fetters;   Surgeon: [name], MD;  Location: [facility] CATH LAB;  Service: Cardiovascular;  Laterality: N/A;     Allergies  No Known Allergies  History of Present Illness    55 y/o ? with a h/o CAD, HTN, HL, morbid obesity, polysubstance abuse, stroke, pancreatitis, IDA, esophagitis, colon polyps, and noncompliance.  He is s/p CABG x 3 in 10/2008 following inferolateral STEMI.  He re-presented in 06/2009 with NSTEMI and was found to have an occluded VG  OM. This was successfully treated with a BMS.  He underwent repeat cath in 2014, revealing severe native 3VD with 3/3 patent grafts.    He had been doing well from a cardiac standpoint, though had come off of all of his meds.  He was in his usoh until this afternoon when he developed severe chest pain.  EMS was activated and he was found to have inferior ST elevation with deep anterior TWI.  Code STEMI was activated and he was taken urgently to the [facility] cath lab.  He did develop VF and required defib x 1 prior to procedure.  Procedure currently ongoing.  Home Medications    Prior to Admission medications   Medication Sig Start Date End Date Taking? Authorizing Provider  aspirin 325 MG tablet Take 1 tablet (325 mg total) by mouth daily. 02/21/15   Delfina Redwood, MD  b complex vitamins tablet Take 1 tablet by  mouth daily.    Historical Provider, MD  LORazepam (ATIVAN) 0.5 MG tablet Take 1 tablet (0.5 mg total) by mouth once. 1 hour prior to imaging study. 12/03/15   Jerene Bears, MD  metoprolol tartrate (LOPRESSOR) 25 MG tablet Take 0.5 tablets (12.5 mg total) by mouth 2 (two) times daily. 09/30/15   Colon Branch, MD  nitroGLYCERIN (NITROSTAT) 0.4 MG SL tablet Place 1 tablet (0.4 mg total) under the tongue every 5 (five) minutes as needed for chest pain. Patient not taking: Reported on 09/30/2015 03/13/15   Burnell Blanks, MD  oxyCODONE-acetaminophen (ROXICET) 5-325 MG tablet Take 1 tablet by mouth every 8 (eight) hours as needed for severe pain. 11/20/15    Jessica D Zehr, PA-C  pantoprazole (PROTONIX) 40 MG tablet Take 1 tablet (40 mg total) by mouth 2 (two) times daily before a meal. 07/30/15   Colon Branch, MD  simvastatin (ZOCOR) 20 MG tablet Take 1 tablet (20 mg total) by mouth at bedtime. 03/13/15   Burnell Blanks, MD    Family History    Family History  Problem Relation Age of Onset  . Heart disease Mother   . Heart failure Mother   . Diabetes Mother   . Colon cancer Father     late 53s  . Prostate cancer Neg Hx     Social History    Social History   Social History  . Marital status: Single    Spouse name: N/A  . Number of children: 2  . Years of education: N/A   Occupational History  . not working , used to be a Chief Executive Officer     Social History Main Topics  . Smoking status: Current Every Day Smoker    Packs/day: 1.00    Years: 30.00    Types: Cigarettes    Last attempt to quit: 07/11/2015  . Smokeless tobacco: Never Used     Comment: 1 PPD, pt. currently smoking 09/13/15  . Alcohol use 0.0 oz/week     Comment: 2 beers a day - has not had alcohol for the last month  . Drug use: No     Comment: former users  . Sexual activity: Not Currently   Other Topics Concern  . Not on file   Social History Narrative   Lives w/ girlfriend      Review of Systems    General:  No chills, fever, night sweats or weight changes.  Cardiovascular:  +++ chest pain, +++ dyspnea, no edema, orthopnea, palpitations, paroxysmal nocturnal dyspnea. Dermatological: No rash, lesions/masses Respiratory: No cough, dyspnea Urologic: No hematuria, dysuria Abdominal:   No nausea, vomiting, diarrhea, bright red blood per rectum, melena, or hematemesis Neurologic:  No visual changes, wkns, changes in mental status. All other systems reviewed and are otherwise negative except as noted above.  Physical Exam    AF, 90, 20, 152/94  General: Pleasant, c/o chest pain, moaning. Psych: Flat affect. Neuro: Alert and oriented X 3.  Moves all extremities spontaneously. HEENT: Normal  Neck: Supple without bruits or JVD. Lungs:  Resp regular and unlabored, CTA. Heart: RRR no s3, s4, or murmurs. Abdomen: Soft, non-tender, non-distended, BS + x 4.  Extremities: No clubbing, cyanosis or edema. DP/PT/Radials 2+ and equal bilaterally.  Labs    All labs pending @ this time.   Radiology Studies    No results found.  ECG & Cardiac Imaging    RSR, 73, LAE, inf ST elevation (27mm) w/ poor R progression and  deep anterior  TWI and ST dep.  Assessment & Plan    1.  Acute inferior STEMI/CAD:  Pt presents with chest pain and inferior ST elevation with anterior reciprocal changes.  VF arrest in cath lab req defib x 1.  Cath/PCI ongoing (VG  OM found to be occluded).  He had come off of all of his meds @ home.  Plan to resume  blocker, asa, and high potency statin.  Check echo.  Eventual cardiac rehab.  2.  Hypertensive heart dzs:  Resume  blocker.  Likely add Acei/arb is creat stable post-cath.  3.  HL:  Previously on simva 20.  LDL was 81 in 05/2015.  Add lipitor 80 and f/u lipids/lft's.  4.  Noncompliance:  Will need counseling.  5.  Polysubstance Abuse:  Currently tob/etoh.  Previously cocaine.  Check uds.  Will need formal counseling.  6.  IDA:  With h/o esophagitis and colon polyps by w/u earlier this year.  F/u CBC.  Add PPI in setting of need for DAPT.  Signed, Murray Hodgkins, NP 07/07/2016, 4:42 PM Patient seen and examined and history reviewed. Agree with above findings and plan. 55 yo WM with known CAD s/p CABG. Prior stent of SVG to OM in 2010 with BMS. History of noncompliance with medical therapy. Continues to smoke. Taking no medication.  Presents today with severe chest pain. Ecg c/w acute posterior STEMI. Brought directly to cath lab. Developed Vfib on arrival requiring DC shock. Will proceed with emergent cardiac cath/PCI.   Peter Martinique, [place] 07/07/2016 5:33 PM

## 2016-07-08 ENCOUNTER — Encounter (HOSPITAL_COMMUNITY): Payer: Self-pay | Admitting: Cardiology

## 2016-07-08 DIAGNOSIS — I472 Ventricular tachycardia: Secondary | ICD-10-CM

## 2016-07-08 LAB — BASIC METABOLIC PANEL
ANION GAP: 9 (ref 5–15)
BUN: 8 mg/dL (ref 6–20)
CALCIUM: 8.9 mg/dL (ref 8.9–10.3)
CO2: 24 mmol/L (ref 22–32)
CREATININE: 0.86 mg/dL (ref 0.61–1.24)
Chloride: 100 mmol/L — ABNORMAL LOW (ref 101–111)
GFR calc Af Amer: >60 mL/min (ref >60)
GLUCOSE: 102 mg/dL — AB (ref 65–99)
Potassium: 4 mmol/L (ref 3.5–5.1)
Sodium: 133 mmol/L — ABNORMAL LOW (ref 135–145)

## 2016-07-08 LAB — LIPID PANEL
Cholesterol: 159 mg/dL (ref 0–200)
HDL: 33 mg/dL — ABNORMAL LOW (ref >40)
LDL Cholesterol: 97 mg/dL (ref 0–99)
Total CHOL/HDL Ratio: 4.8 RATIO
Triglycerides: 147 mg/dL (ref <150)
VLDL: 29 mg/dL (ref 0–40)

## 2016-07-08 LAB — HEMOGLOBIN A1C
HEMOGLOBIN A1C: 5.7 % — AB (ref 4.8–5.6)
HEMOGLOBIN A1C: 5.8 % — AB (ref 4.8–5.6)
MEAN PLASMA GLUCOSE: 120 mg/dL
Mean Plasma Glucose: 117 mg/dL

## 2016-07-08 LAB — CBC
HEMATOCRIT: 40.5 % (ref 39.0–52.0)
HEMOGLOBIN: 13.1 g/dL (ref 13.0–17.0)
MCH: 27.2 pg (ref 26.0–34.0)
MCHC: 32.3 g/dL (ref 30.0–36.0)
MCV: 84 fL (ref 78.0–100.0)
Platelets: 228 10*3/uL (ref 150–400)
RBC: 4.82 MIL/uL (ref 4.22–5.81)
RDW: 18.2 % — ABNORMAL HIGH (ref 11.5–15.5)
WBC: 11.3 10*3/uL — AB (ref 4.0–10.5)

## 2016-07-08 LAB — MRSA PCR SCREENING: MRSA BY PCR: NEGATIVE

## 2016-07-08 LAB — MAGNESIUM: Magnesium: 2.1 mg/dL (ref 1.7–2.4)

## 2016-07-08 LAB — TROPONIN I: Troponin I: 40.07 ng/mL (ref <0.03)

## 2016-07-08 MED ORDER — METOPROLOL TARTRATE 25 MG PO TABS
25.0000 mg | ORAL_TABLET | Freq: Two times a day (BID) | ORAL | Status: DC
  Administered 2016-07-08 – 2016-07-09 (×3): 25 mg via ORAL
  Filled 2016-07-08 (×3): qty 1

## 2016-07-08 MED ORDER — NICOTINE 21 MG/24HR TD PT24
21.0000 mg | MEDICATED_PATCH | Freq: Every day | TRANSDERMAL | Status: DC
  Administered 2016-07-08 – 2016-07-09 (×2): 21 mg via TRANSDERMAL
  Filled 2016-07-08 (×2): qty 1

## 2016-07-08 MED FILL — Nitroglycerin IV Soln 100 MCG/ML in D5W: INTRA_ARTERIAL | Qty: 10 | Status: AC

## 2016-07-08 NOTE — Progress Notes (Addendum)
     SUBJECTIVE: No chest pain or dyspnea.   Tele: sinus  BP (!) 135/94   Pulse 64   Temp 97.8 F (36.6 C) (Oral)   Resp (!) 21   Ht 5\' 11"  (1.803 m)   Wt 218 lb 11.2 oz (99.2 kg)   SpO2 93%   BMI 30.50 kg/m   Intake/Output Summary (Last 24 hours) at 07/08/16 0711 Last data filed at 07/08/16 0300  Gross per 24 hour  Intake              423 ml  Output              500 ml  Net              -77 ml    PHYSICAL EXAM General: Well developed, well nourished, in no acute distress. Alert and oriented x 3.  Psych:  Good affect, responds appropriately Neck: No JVD. No masses noted.  Lungs: Clear bilaterally with no wheezes or rhonci noted.  Heart: RRR with no murmurs noted. Abdomen: Bowel sounds are present. Soft, non-tender.  Extremities: No lower extremity edema.   LABS: Basic Metabolic Panel:  Recent Labs  07/07/16 1625  NA 135  K 3.8  CL 102  CO2 24  GLUCOSE 126*  BUN 10  CREATININE 0.85  CALCIUM 8.6*   CBC:  Recent Labs  07/07/16 1625 07/08/16 0532  WBC 12.0* 11.3*  HGB 13.3 13.1  HCT 42.3 40.5  MCV 84.4 84.0  PLT 238 228   Cardiac Enzymes:  Recent Labs  07/07/16 1625 07/07/16 1831 07/07/16 2255  TROPONINI <0.03 9.49* 47.49*   Fasting Lipid Panel:  Recent Labs  07/07/16 1625  CHOL 168  HDL 39*  LDLCALC 104*  TRIG 126  CHOLHDL 4.3    Current Meds: . aspirin  81 mg Oral Daily  . atorvastatin  80 mg Oral q1800  . enoxaparin (LOVENOX) injection  40 mg Subcutaneous Q24H  . metoprolol tartrate  12.5 mg Oral BID  . multivitamin with minerals  1 tablet Oral Daily  . pantoprazole  40 mg Oral BID AC  . sodium chloride flush  3 mL Intravenous Q12H  . sodium chloride flush  3 mL Intravenous Q12H  . sodium chloride flush  3 mL Intravenous Q12H  . ticagrelor  90 mg Oral BID     ASSESSMENT AND PLAN:  1. CAD/Acute lateral STEMI: Pt admitted with STEMI on 07/07/16. The SVG to the OM was occluded. A DES was placed in the proximal body of the  SVG to the OM.  The LIMA graft to the occluded native LAD was patent and the SVG to the occluded native RCA was patent. Normal LV function. He is stable this am. Peak troponin 47.49. Will continue ASA, Brilinta, statin, beta blocker.   2. Non-sustained VT: Several short runs of VT overnight. Will increase Lopressor to 25 mg po BID  3. Tobacco abuse: nicotine patch. Smoking cessation encouraged.   Will leave in ICU today given NSVT. Will ask case management to see to discuss his insurance. He is not sure he has coverage for meds despite having medicaid.     [name]  11/29/20177:11 AM

## 2016-07-08 NOTE — Progress Notes (Signed)
CARDIAC REHAB PHASE I   PRE:  Rate/Rhythm: 74 SR  BP:  Sitting: 123/72        SaO2: 97 RA  MODE:  Ambulation: 470 ft   POST:  Rate/Rhythm: 78 SR  BP:  Sitting: 129/65         SaO2: 96 RA  Pt ambulated 470 ft on RA, handheld assist, steady gait, tolerated fairly well.  Pt c/o DOE, denies cp, dizziness, declined rest stop. Completed MI/stent education.  Reviewed risk factors, tobacco cessation (gave pt fake cigarette), MI book, anti-platelet therapy, stent card, activity restrictions, ntg, exercise, heart healthy diet, and phase 2 cardiac rehab. Pt verbalized understanding. Pt agrees to phase 2 cardiac rehab referral, will send to [facility] per pt request. Pt c/o intermittent shortness of breat at rest, RN notified.  Will follow.   [id number] Lenna Sciara, RN, BSN 07/08/2016 12:26 PM

## 2016-07-08 NOTE — Progress Notes (Signed)
Pt had 3 beats of VT one regular beat then another 13 beats of VT. Pt asleep in bed asymptomatic. MD made aware, Dr. Eula Fried. Labs ordered for this am will continue to monitor.

## 2016-07-08 NOTE — Care Management Note (Addendum)
Case Management Note  Patient Details  Name: [name]. MRN: [id number] Date of Birth: [birth year]  Subjective/Objective:     Adm w mi               Action/Plan: plan to return home. On diability but not sure medicaid still in effect.  Expected Discharge Date:                  Expected Discharge Plan:  Home/Self Care  In-House Referral:     Discharge planning Services  CM Consult, Medication Assistance  Post Acute Care Choice:    Choice offered to:     DME Arranged:    DME Agency:     HH Arranged:    HH Agency:     Status of Service:  In process, will continue to follow  If discussed at Long Length of Stay Meetings, dates discussed:    Additional Comments:have called [employer] to see if pt's medicaid still active. Gave pt 30day free brilinta card. Spoke w [facility] [employer] and pt does not have medicaid at present. She will see pt to see if he qualifies for anything. Gave pt inform on [place] [facility]. Placed brilinta pt assist form on shadow chart. Pt gets soc sec but does not have medicare or medicaid.   Lacretia Leigh, RN 07/08/2016, 9:45 AM

## 2016-07-09 ENCOUNTER — Encounter (HOSPITAL_COMMUNITY): Payer: Self-pay | Admitting: Student

## 2016-07-09 ENCOUNTER — Other Ambulatory Visit (HOSPITAL_COMMUNITY): Payer: Medicaid Other

## 2016-07-09 DIAGNOSIS — I2119 ST elevation (STEMI) myocardial infarction involving other coronary artery of inferior wall: Secondary | ICD-10-CM

## 2016-07-09 LAB — BASIC METABOLIC PANEL
ANION GAP: 8 (ref 5–15)
BUN: 12 mg/dL (ref 6–20)
CALCIUM: 8.7 mg/dL — AB (ref 8.9–10.3)
CO2: 24 mmol/L (ref 22–32)
CREATININE: 0.87 mg/dL (ref 0.61–1.24)
Chloride: 101 mmol/L (ref 101–111)
Glucose, Bld: 105 mg/dL — ABNORMAL HIGH (ref 65–99)
Potassium: 4.1 mmol/L (ref 3.5–5.1)
Sodium: 133 mmol/L — ABNORMAL LOW (ref 135–145)

## 2016-07-09 LAB — CBC
HCT: 42.4 % (ref 39.0–52.0)
HEMOGLOBIN: 13.5 g/dL (ref 13.0–17.0)
MCH: 26.9 pg (ref 26.0–34.0)
MCHC: 31.8 g/dL (ref 30.0–36.0)
MCV: 84.5 fL (ref 78.0–100.0)
PLATELETS: 210 10*3/uL (ref 150–400)
RBC: 5.02 MIL/uL (ref 4.22–5.81)
RDW: 18.4 % — ABNORMAL HIGH (ref 11.5–15.5)
WBC: 9.3 10*3/uL (ref 4.0–10.5)

## 2016-07-09 MED ORDER — CLOPIDOGREL BISULFATE 75 MG PO TABS: 75.0000 mg | ORAL_TABLET | Freq: Every day | ORAL | 11 refills | Status: DC

## 2016-07-09 MED ORDER — METOPROLOL TARTRATE 25 MG PO TABS: 25.0000 mg | ORAL_TABLET | Freq: Two times a day (BID) | ORAL | 6 refills | Status: DC

## 2016-07-09 MED ORDER — CLOPIDOGREL BISULFATE 75 MG PO TABS
75.0000 mg | ORAL_TABLET | Freq: Every day | ORAL | Status: DC
  Administered 2016-07-09: 75 mg via ORAL
  Filled 2016-07-09: qty 1

## 2016-07-09 MED ORDER — ASPIRIN 81 MG PO CHEW: 81.0000 mg | CHEWABLE_TABLET | Freq: Every day | ORAL | Status: DC

## 2016-07-09 MED ORDER — ATORVASTATIN CALCIUM 80 MG PO TABS: 80.0000 mg | ORAL_TABLET | Freq: Every day | ORAL | 11 refills | Status: DC

## 2016-07-09 MED ORDER — NITROGLYCERIN 0.4 MG SL SUBL: 0.4000 mg | SUBLINGUAL_TABLET | SUBLINGUAL | 2 refills | Status: DC | PRN

## 2016-07-09 NOTE — Progress Notes (Signed)
Pt discharged to home. Pt alert and oriented at time of discharge. Discharge instructions reviewed and questions answered. PIV removed. Pt taken out in wheelchair with NT. [place] notified.

## 2016-07-09 NOTE — Progress Notes (Signed)
     SUBJECTIVE: No chest pain or dyspnea.   Tele: sinus  BP 113/69   Pulse 67   Temp 98.3 F (36.8 C) (Oral)   Resp 18   Ht 5\' 11"  (1.803 m)   Wt 218 lb 7.6 oz (99.1 kg)   SpO2 94%   BMI 30.47 kg/m   Intake/Output Summary (Last 24 hours) at 07/09/16 0737 Last data filed at 07/08/16 2027  Gross per 24 hour  Intake              483 ml  Output              575 ml  Net              -92 ml    PHYSICAL EXAM General: Well developed, well nourished, in no acute distress. Alert and oriented x 3.  Psych:  Good affect, responds appropriately Neck: No JVD. No masses noted.  Lungs: Clear bilaterally with no wheezes or rhonci noted.  Heart: RRR with no murmurs noted. Abdomen: Bowel sounds are present. Soft, non-tender.  Extremities: No lower extremity edema.   LABS: Basic Metabolic Panel:  Recent Labs  07/08/16 0532 07/09/16 0545  NA 133* 133*  K 4.0 4.1  CL 100* 101  CO2 24 24  GLUCOSE 102* 105*  BUN 8 12  CREATININE 0.86 0.87  CALCIUM 8.9 8.7*  MG 2.1  --    CBC:  Recent Labs  07/08/16 0532 07/09/16 0545  WBC 11.3* 9.3  HGB 13.1 13.5  HCT 40.5 42.4  MCV 84.0 84.5  PLT 228 210   Cardiac Enzymes:  Recent Labs  07/07/16 1831 07/07/16 2255 07/08/16 0532  TROPONINI 9.49* 47.49* 40.07*   Fasting Lipid Panel:  Recent Labs  07/08/16 0532  CHOL 159  HDL 33*  LDLCALC 97  TRIG 147  CHOLHDL 4.8    Current Meds: . aspirin  81 mg Oral Daily  . atorvastatin  80 mg Oral q1800  . enoxaparin (LOVENOX) injection  40 mg Subcutaneous Q24H  . metoprolol tartrate  25 mg Oral BID  . multivitamin with minerals  1 tablet Oral Daily  . nicotine  21 mg Transdermal Daily  . pantoprazole  40 mg Oral BID AC  . sodium chloride flush  3 mL Intravenous Q12H  . sodium chloride flush  3 mL Intravenous Q12H  . sodium chloride flush  3 mL Intravenous Q12H  . ticagrelor  90 mg Oral BID     ASSESSMENT AND PLAN:  1. CAD/Acute lateral STEMI: Pt admitted with STEMI on  07/07/16. The SVG to the OM was occluded. A DES was placed in the proximal body of the SVG to the OM.  The LIMA graft to the occluded native LAD was patent and the SVG to the occluded native RCA was patent. Normal LV function. He is stable this am. Peak troponin 47.49. Will continue ASA, statin, beta blocker. He did have dyspnea with Brilinta last night. Will change to PLavix today which will also be better given his financial situation.     2. Non-sustained VT: No recurrent VT last 24 hours. Continue Lopressor 25 mg po BID  3. Tobacco abuse: nicotine patch. Smoking cessation encouraged.    Discharge home today. Follow up with me or office APP in 1-2 weeks.   [name]  11/30/20177:37 AM

## 2016-07-09 NOTE — Discharge Summary (Signed)
Discharge Summary    Patient ID: [name].,  MRN: [id number], DOB/AGE: 09-04-1960 55 y.o.  Admit date: 07/07/2016 Discharge date: 07/09/2016  Primary Care Provider: No primary care provider on file. Primary Cardiologist: Dr. Angelena Form  Discharge Diagnoses    Principal Problem:   Acute ST elevation myocardial infarction (STEMI) of inferior wall ([place]) Active Problems:   Dyslipidemia   Morbid obesity ([place])   Tobacco abuse   History of Present Illness     [name]. is a 55 y.o. male with past medical history of CAD (s/p CABG x 3 in 10/2008 following inferolateral STEMI, occluded SVG-OM by cath in 06/2009 treated with a BMS, cath in 2014 with 3/3 patent grafts), HTN, HLD, morbid obesity, polysubstance abuse, stroke, pancreatitis, IDA, esophagitis, colon polyps, and medical noncompliance who presented to [facility] ED on 07/07/2016 as a CODE STEMI.   Reported doing well until the afternoon of his presentation when he developed severe chest pain. EMS was called and his initial EKG showed ST elevation in the inferior leads with anterior TWI. Reported not taking any of his cardiac medications.   Code STEMI was activated and he was taken urgently to the cardiac catheterization lab. Prior to the procedure, he did develop VF and required defibrillation x 1.     Hospital Course     Consultants: None   His cardiac catheterization showed severe 3 vessel CAD with patent LIMA-LAD, patent SVG-PDA, and occluded SVG-OM. Successful PCI was performed of the SVG-OM with placement of a Synergy DES. LV function was normal by cath. He will be on DAPT for 1 year and was started on ASA and Brilinta following the procedure.    The following morning, he denied any repeat episodes of chest discomfort. Troponin values peaked at 47.49. Lipid Panel showed total cholesterol 159, HDL 33, and LDL 97, therefore he was started on high-intensity statin therapy with an LDL goal of <70. He did  have several runs of BT overnight, therefore Lopressor was increased from 12.5mg  BID to 25mg  BID. He ambulated over 470 ft with cardiac rehab with no recurrent chest discomfort.   On 07/09/2016, he continued to deny any anginal symptoms. Was strongly encouraged to quit smoking. Telemetry was reviewed and showed no recurrent episodes of VT in the past 24 hours. Creatinine was stable at 0.87 with Hgb of 13.5. His Brilinta was switched to Plavix due to significant dyspnea with the Brilinta along with concerns about the affordability of Brilinta.   He was last examined by Dr. Angelena Form and deemed stable for discharge. Medications are listed below. Cardiology hospital follow-up has been arranged. He was discharged home in good condition.   _____________  Discharge Vitals Blood pressure 125/80, pulse 68, temperature 97.3 F (36.3 C), temperature source Oral, resp. rate 20, height 5\' 11"  (1.803 m), weight 218 lb 7.6 oz (99.1 kg), SpO2 93 %.  Filed Weights   07/07/16 1745 07/08/16 0500 07/09/16 0500  Weight: 218 lb 4.1 oz (99 kg) 218 lb 11.2 oz (99.2 kg) 218 lb 7.6 oz (99.1 kg)    Labs & Radiologic Studies     CBC  Recent Labs  07/08/16 0532 07/09/16 0545  WBC 11.3* 9.3  HGB 13.1 13.5  HCT 40.5 42.4  MCV 84.0 84.5  PLT 228 [id number]   Basic Metabolic Panel  Recent Labs  07/08/16 0532 07/09/16 0545  NA 133* 133*  K 4.0 4.1  CL 100* 101  CO2 24 24  GLUCOSE  102* 105*  BUN 8 12  CREATININE 0.86 0.87  CALCIUM 8.9 8.7*  MG 2.1  --    Liver Function Tests  Recent Labs  07/07/16 1625  AST 22  ALT 20  ALKPHOS 79  BILITOT 1.0  PROT 7.0  ALBUMIN 3.5   No results for input(s): LIPASE, AMYLASE in the last 72 hours. Cardiac Enzymes  Recent Labs  07/07/16 1831 07/07/16 2255 07/08/16 0532  TROPONINI 9.49* 47.49* 40.07*   BNP Invalid input(s): POCBNP D-Dimer No results for input(s): DDIMER in the last 72 hours. Hemoglobin A1C  Recent Labs  07/07/16 1831  HGBA1C 5.8*    Fasting Lipid Panel  Recent Labs  07/08/16 0532  CHOL 159  HDL 33*  LDLCALC 97  TRIG 147  CHOLHDL 4.8   Thyroid Function Tests  Recent Labs  07/07/16 1831  TSH 1.895    No results found.   Diagnostic Studies/Procedures     Cardiac Catheterization: 07/07/2016  The left ventricular systolic function is normal.  LV end diastolic pressure is moderately elevated.  The left ventricular ejection fraction is 55-65% by visual estimate.  Ost LAD to Prox LAD lesion, 95 %stenosed.  Prox LAD to Mid LAD lesion, 100 %stenosed.  Ost Cx to Mid Cx lesion, 100 %stenosed.  Prox RCA to Dist RCA lesion, 100 %stenosed.  SVG graft was visualized by angiography and is normal in caliber and anatomically normal.  LIMA graft was visualized by angiography and is normal in caliber and anatomically normal.  SVG graft was visualized by angiography and is large.  Origin to Prox Graft lesion, 100 %stenosed.  A STENT SYNERGY DES 4X20 drug eluting stent was successfully placed.  Post intervention, there is a 0% residual stenosis.   1. Severe 3 vessel obstructive CAD 2. Patent LIMA to the LAD 3. Occluded SVG to the OM. This is the culprit 4. Patent SVG to PDA 5. Good LV function 6. Elevated LVEDP 7. Successful PCI of the SVG to OM with DES  Plan: DAPT for one year. Risk factor modification.    Disposition   Pt is being discharged home today in good condition.  Follow-up Plans & Appointments    Follow-up Information    Ermalinda Barrios, PA-C Follow up on 07/20/2016.   Specialty:  Cardiology Why:  Cardiology Hospital Follow-Up on 07/20/2016 at 1:45PM. (Dr. Camillia Herter Office) Contact information: [address] [place] [address] [phone]          Discharge Instructions    AMB Referral to Cardiac Rehabilitation - Phase II    Complete by:  As directed    Diagnosis:  STEMI   AMB Referral to Cardiac Rehabilitation - Phase II    Complete by:  As  directed    Diagnosis:  STEMI   Amb Referral to Cardiac Rehabilitation    Complete by:  As directed    Diagnosis:   STEMI Coronary Stents     Diet - low sodium heart healthy    Complete by:  As directed    Discharge instructions    Complete by:  As directed    PLEASE REMEMBER TO BRING ALL OF YOUR MEDICATIONS TO EACH OF YOUR FOLLOW-UP OFFICE VISITS.  PLEASE ATTEND ALL SCHEDULED FOLLOW-UP APPOINTMENTS.   Activity: Increase activity slowly as tolerated. You may shower, but no soaking baths (or swimming) for 1 week. You may not return to work until cleared by your cardiologist. No lifting over 10 lbs for 4 weeks. No sexual activity for  4 weeks.   Wound Care: You may wash cath site gently with soap and water. Keep cath site clean and dry. If you notice pain, swelling, bleeding or pus at your cath site, please call [phone].   Increase activity slowly    Complete by:  As directed       Discharge Medications     Medication List    STOP taking these medications   aspirin 325 MG tablet Replaced by:  aspirin 81 MG chewable tablet   simvastatin 20 MG tablet Commonly known as:  ZOCOR     TAKE these medications   aspirin 81 MG chewable tablet Chew 1 tablet (81 mg total) by mouth daily. Replaces:  aspirin 325 MG tablet   atorvastatin 80 MG tablet Commonly known as:  LIPITOR Take 1 tablet (80 mg total) by mouth daily at 6 PM.   b complex vitamins tablet Take 1 tablet by mouth daily.   clopidogrel 75 MG tablet Commonly known as:  PLAVIX Take 1 tablet (75 mg total) by mouth daily.   LORazepam 0.5 MG tablet Commonly known as:  ATIVAN Take 1 tablet (0.5 mg total) by mouth once. 1 hour prior to imaging study.   metoprolol tartrate 25 MG tablet Commonly known as:  LOPRESSOR Take 1 tablet (25 mg total) by mouth 2 (two) times daily. What changed:  how much to take   nitroGLYCERIN 0.4 MG SL tablet Commonly known as:  NITROSTAT Place 1 tablet (0.4 mg total) under the tongue  every 5 (five) minutes as needed for chest pain.   oxyCODONE-acetaminophen 5-325 MG tablet Commonly known as:  ROXICET Take 1 tablet by mouth every 8 (eight) hours as needed for severe pain.   pantoprazole 40 MG tablet Commonly known as:  PROTONIX Take 1 tablet (40 mg total) by mouth 2 (two) times daily before a meal.       Aspirin prescribed at discharge?  Yes High Intensity Statin Prescribed? (Lipitor 40-80mg  or Crestor 20-40mg ): Yes Beta Blocker Prescribed? Yes For EF 40% or less, Was ACEI/ARB Prescribed? No: EF > 40% ADP Receptor Inhibitor Prescribed? (i.e. Plavix etc.-Includes Medically Managed Patients): Yes For EF <40%, Aldosterone Inhibitor Prescribed? No: EF > 40% Was EF assessed during THIS hospitalization? Yes Was Cardiac Rehab II ordered? (Included Medically managed Patients): Yes   Allergies No Known Allergies   Outstanding Labs/Studies   BMET at follow-up appointment. FLP and LFT's in 6 weeks.  Duration of Discharge Encounter   Greater than 30 minutes including physician time.  Signed, Erma Heritage, PA-C 07/09/2016, 9:11 AM

## 2016-07-09 NOTE — Care Management Note (Signed)
Case Management Note  Patient Details  Name: [name]. MRN: [id number] Date of Birth: 03-03-1961  Subjective/Objective:         Adm w  stemi          Action/Plan:to return home   Expected Discharge Date:                  Expected Discharge Plan:  Home/Self Care  In-House Referral:     Discharge planning Services  CM Consult, Medication Assistance, [place] [facility]  Post Acute Care Choice:    Choice offered to:     DME Arranged:    DME Agency:     HH Arranged:    [place] Agency:     Status of Service:  Completed, signed off  If discussed at [employer], dates discussed:    Additional Comments: no ins medicaid has ended. Gave pt match letter to get meds for 3.00 per prescription for 1 month. Pt instructed to use one of pharm listed they are only ones that participate in program. Enc pt to get in [facility] in [place] so he can use pharmacy thereafter., dc home today. indep  Lacretia Leigh, RN 07/09/2016, 9:30 AM

## 2016-07-20 ENCOUNTER — Ambulatory Visit: Payer: Medicaid Other | Admitting: Physician Assistant

## 2016-07-29 ENCOUNTER — Encounter: Payer: Self-pay | Admitting: Physician Assistant

## 2016-08-08 ENCOUNTER — Other Ambulatory Visit: Payer: Self-pay | Admitting: Internal Medicine

## 2016-08-12 ENCOUNTER — Other Ambulatory Visit: Payer: Self-pay | Admitting: Internal Medicine

## 2016-08-13 ENCOUNTER — Other Ambulatory Visit: Payer: Self-pay | Admitting: Internal Medicine

## 2016-08-14 ENCOUNTER — Telehealth: Payer: Self-pay | Admitting: Internal Medicine

## 2016-08-14 NOTE — Telephone Encounter (Signed)
We have never prescribed this medication for patient. Last rx was sent in 2016 by Dr [name] and he no showed his last appointment with Dr Hilarie Fredrickson. Was recently hospitalized for STEMI and no showed his office follow up for that as well. He needs to see [place] in the office prior to making a decision about which medications are appropriate for him at this time. Patient advised.

## 2016-08-17 ENCOUNTER — Telehealth: Payer: Self-pay | Admitting: Cardiovascular Disease

## 2016-08-17 NOTE — Telephone Encounter (Signed)
New message      Talk to the nurse.  Pt would not tell me what he wanted.  He is aware of his appt tomorrow.  Please call

## 2016-08-17 NOTE — Telephone Encounter (Signed)
I spoke with pt who is asking if we can refill his protonix.  This was previously prescribed by primary care.  Pt reports he has not seen primary care recently due to insurance reasons.  Pt has contacted GI today for refill and was told he needed office visit to make sure he was on appropriate medication.  Pt reports heartburn is not new. It is worse when lying down. He reports is it not like pain he had with MI.  I asked pt to discuss at office visit with Ermalinda Barrios, PA tomorrow regarding refills for this medication.  Pt agreeable with this plan.

## 2016-08-18 ENCOUNTER — Ambulatory Visit (INDEPENDENT_AMBULATORY_CARE_PROVIDER_SITE_OTHER): Payer: Self-pay | Admitting: Physician Assistant

## 2016-08-18 ENCOUNTER — Other Ambulatory Visit: Payer: Self-pay | Admitting: Physician Assistant

## 2016-08-18 ENCOUNTER — Encounter (INDEPENDENT_AMBULATORY_CARE_PROVIDER_SITE_OTHER): Payer: Self-pay

## 2016-08-18 ENCOUNTER — Encounter: Payer: Self-pay | Admitting: Physician Assistant

## 2016-08-18 VITALS — BP 122/70 | HR 77 | Ht 71.0 in | Wt 219.8 lb

## 2016-08-18 DIAGNOSIS — Z9119 Patient's noncompliance with other medical treatment and regimen: Secondary | ICD-10-CM

## 2016-08-18 DIAGNOSIS — I739 Peripheral vascular disease, unspecified: Secondary | ICD-10-CM

## 2016-08-18 DIAGNOSIS — I10 Essential (primary) hypertension: Secondary | ICD-10-CM

## 2016-08-18 DIAGNOSIS — F17200 Nicotine dependence, unspecified, uncomplicated: Secondary | ICD-10-CM

## 2016-08-18 DIAGNOSIS — E785 Hyperlipidemia, unspecified: Secondary | ICD-10-CM

## 2016-08-18 DIAGNOSIS — I25708 Atherosclerosis of coronary artery bypass graft(s), unspecified, with other forms of angina pectoris: Secondary | ICD-10-CM

## 2016-08-18 DIAGNOSIS — K219 Gastro-esophageal reflux disease without esophagitis: Secondary | ICD-10-CM

## 2016-08-18 DIAGNOSIS — Z91199 Patient's noncompliance with other medical treatment and regimen due to unspecified reason: Secondary | ICD-10-CM

## 2016-08-18 MED ORDER — ISOSORBIDE MONONITRATE ER 30 MG PO TB24
30.0000 mg | ORAL_TABLET | Freq: Every day | ORAL | 11 refills | Status: DC
Start: 2016-08-18 — End: 2017-08-24

## 2016-08-18 MED ORDER — PANTOPRAZOLE SODIUM 40 MG PO TBEC: 40.0000 mg | DELAYED_RELEASE_TABLET | Freq: Two times a day (BID) | ORAL | 5 refills | Status: DC

## 2016-08-18 NOTE — Progress Notes (Signed)
Cardiology Office Note    Date:  08/18/2016   ID:  [name]., DOB April 08, 1961, MRN [id number]  PCP:  No PCP Per Patient  Cardiologist: Dr. Angelena Form  Chief Complaint  Patient presents with  . Follow-up    History of Present Illness:  [name]. is a 56 y.o. male with past medical history of CAD (s/p CABG x 3 in 10/2008 following inferolateral STEMI, occluded SVG-OM by cath in 06/2009 treated with a BMS, cath in 2014 with 3/3 patent grafts), HTN, HLD,chronic diastolic CHF,cocaine abuse, morbid obesity, polysubstance abuse, stroke, pancreatitis, IDA, esophagitis, colon polyps, and medical noncompliance who presented to [facility] ED on 07/07/2016 as a CODE STEMI and he was taken urgently to the cardiac catheterization lab. Prior to the procedure, he did develop VF and required defibrillation x 1.    His cardiac catheterization showed severe 3 vessel CAD with patent LIMA-LAD, patent SVG-PDA, and occluded SVG-OM. Successful PCI was performed of the SVG-OM with placement of a Synergy DES. LV function was normal by cath. He will be on DAPT for 1 year and was started on ASA and Brilinta following the procedure. His Brilinta was switched to Plavix due to significant dyspnea with the Brilinta along with concerns about the affordability of Brilinta.   Patient feels terrible. Says he can't walk far because he gets pain in calfs, buttocks, thighs, lower back. He occasionally has angina but this is chronic for him.Hasn't used NTG. Occurs when walking around. Chronic shortness of breath with exertion. Smoking 1 ppd, drinks 6 pack beer 2 days/week. No cocaine. Ran out of Plavix and is having terrible indigestion just with drinking water. No insurance and on disability so misses a lot of appts.              Past Medical History:  Diagnosis Date  . Abnormality of gait   . CAD (coronary artery disease)    a. 10/2008 Inferolateral STEMI: 3VD w/ occluded LCX (PTCA)-->CABG x 3  (LIMA->LAD, VG->OM, VG->PDA);  b. 06/2009 NSTEMI/PCI: VG->OM 100 (BMS);  c. 05/2013 native 3VD, 3/3 patent grafts. d. 06/2016: Inferior STEMI w/ occluded SVG-OM (DES placed). LIMA-LAD and SVG-PDA patent.  . Chronic diastolic CHF (congestive heart failure) ([place])    a. 02/2015 Echo: EF 50-55%, distal septal, apical, inferobasal HK, mild LVH, mild MR, mildly dil LA.  [name] Cocaine use   . Colon polyps    a. 09/2015 colonscopy: multiple sessile polyps, path negative for high grade dysplasia.  . Emphysema (subcutaneous) (surgical) resulting from a procedure 10/2008.   Bleb resected at time of 10/2008 CABG. Marketed emphysema noted on surgical report.  . Esophagitis    a. 2017 EGD: esophagitis, duodenitis.  [name] ETOH abuse   . GERD (gastroesophageal reflux disease)   . Hepatic steatosis   . Hiatal hernia   . Hyperlipidemia   . Hypertension   . Iron deficiency anemia 2010  . Morbid obesity ([place])   . Pancreatitis 06/2015  . Sleep apnea   . Stroke ([place]) 02/2015   a. 02/2015 Carotid U/S: 1-39% bilat ICA stenosis.  . Tobacco abuse    still smokes  . Tubular adenoma of colon     Past Surgical History:  Procedure Laterality Date  . CARDIAC CATHETERIZATION  11/07/2008   EF of 50-55% -- normal LV systolic function, acute inferolateral ST elevation MI,  PTCA of the circumflex artery -- [name]. [name], [name]  . CARDIAC CATHETERIZATION N/A 07/07/2016   Procedure: Left Heart  Cath and Cors/Grafts Angiography;  Surgeon: [name], MD;  Location: [facility];  Service: Cardiovascular;  Laterality: N/A;  . CARDIAC CATHETERIZATION N/A 07/07/2016   Procedure: Coronary Stent Intervention;  Surgeon: [name], MD;  Location: [facility];  Service: Cardiovascular;  Laterality: N/A;  . CORONARY ARTERY BYPASS GRAFT    . LEFT HEART CATHETERIZATION WITH CORONARY/GRAFT ANGIOGRAM N/A 05/15/2013   Procedure: LEFT HEART CATHETERIZATION WITH Beatrix Fetters;  Surgeon: [name], MD;   Location: [facility] CATH LAB;  Service: Cardiovascular;  Laterality: N/A;    Current Medications: Outpatient Medications Prior to Visit  Medication Sig Dispense Refill  . aspirin 81 MG chewable tablet Chew 1 tablet (81 mg total) by mouth daily.    [name] atorvastatin (LIPITOR) 80 MG tablet Take 1 tablet (80 mg total) by mouth daily at 6 PM. 30 tablet 11  . b complex vitamins tablet Take 1 tablet by mouth daily.    . clopidogrel (PLAVIX) 75 MG tablet Take 1 tablet (75 mg total) by mouth daily. 30 tablet 11  . LORazepam (ATIVAN) 0.5 MG tablet Take 1 tablet (0.5 mg total) by mouth once. 1 hour prior to imaging study. 1 tablet 0  . metoprolol tartrate (LOPRESSOR) 25 MG tablet Take 1 tablet (25 mg total) by mouth 2 (two) times daily. 60 tablet 6  . nitroGLYCERIN (NITROSTAT) 0.4 MG SL tablet Place 1 tablet (0.4 mg total) under the tongue every 5 (five) minutes as needed for chest pain. 25 tablet 2  . oxyCODONE-acetaminophen (ROXICET) 5-325 MG tablet Take 1 tablet by mouth every 8 (eight) hours as needed for severe pain. 30 tablet 0  . pantoprazole (PROTONIX) 40 MG tablet Take 1 tablet (40 mg total) by mouth 2 (two) times daily before a meal. 60 tablet 3   No facility-administered medications prior to visit.      Allergies:   Patient has no known allergies.   Social History   Social History  . Marital status: Single    Spouse name: N/A  . Number of children: 2  . Years of education: N/A   Occupational History  . not working , used to be a Chief Executive Officer     Social History Main Topics  . Smoking status: Current Every Day Smoker    Packs/day: 1.00    Years: 30.00    Types: Cigarettes    Last attempt to quit: 07/11/2015  . Smokeless tobacco: Never Used     Comment: 1 PPD, pt. currently smoking 09/13/15  . Alcohol use 0.0 oz/week     Comment: 2 beers a day - has not had alcohol for the last month  . Drug use: No     Comment: former users  . Sexual activity: Not Currently   Other Topics  Concern  . None   Social History Narrative   Lives w/ girlfriend      Family History:  The patient's family history includes Colon cancer in his father; Diabetes in his mother; Heart disease in his mother; Heart failure in his mother.   ROS:   Please see the history of present illness.    Review of Systems  Constitution: Positive for weakness and malaise/fatigue.  HENT: Negative.   Eyes: Positive for visual disturbance.  Cardiovascular: Positive for chest pain and dyspnea on exertion.  Respiratory: Positive for cough and shortness of breath.   Endocrine: Negative.   Hematologic/Lymphatic: Negative.   Musculoskeletal: Positive for muscle weakness and myalgias.  Gastrointestinal:  Positive for abdominal pain and heartburn.  Genitourinary: Negative.    All other systems reviewed and are negative.   PHYSICAL EXAM:   VS:  BP 122/70   Pulse 77   Ht 5\' 11"  (1.803 m)   Wt 219 lb 12.8 oz (99.7 kg)   SpO2 95%   BMI 30.66 kg/m   Physical Exam  GEN: Well nourished, well developed, in no acute distress  Neck: no JVD, carotid bruits, or masses Cardiac:RRR;S4 no murmurs, rubs  Respiratory:  clear to auscultation bilaterally, normal work of breathing GI: soft, nontender, nondistended, + BS [id number] arm without hematoma or hemorrhage at cath site. Good radial and brachial pulses. without cyanosis, clubbing, or edema, Good posterior tibial and dorsalis pedis pulses bilaterally MS: no deformity or atrophy  Psych: euthymic mood, full affect  Wt Readings from Last 3 Encounters:  08/18/16 219 lb 12.8 oz (99.7 kg)  07/09/16 218 lb 7.6 oz (99.1 kg)  11/21/15 205 lb (93 kg)      Studies/Labs Reviewed:   EKG:  EKG is ordered today.  The ekg ordered today demonstrates Normal sinus rhythm with nonspecific ST-T wave changes, no acute change  Recent Labs: 07/07/2016: ALT 20; TSH 1.895 07/08/2016: Magnesium 2.1 07/09/2016: BUN 12; Creatinine, Ser 0.87; Hemoglobin 13.5; Platelets 210;  Potassium 4.1; Sodium 133   Lipid Panel    Component Value Date/Time   CHOL 159 07/08/2016 0532   TRIG 147 07/08/2016 0532   HDL 33 (L) 07/08/2016 0532   CHOLHDL 4.8 07/08/2016 0532   VLDL 29 07/08/2016 0532   LDLCALC 97 07/08/2016 0532   LDLDIRECT 116.0 05/12/2013 1517    Additional studies/ records that were reviewed today include:  Cardiac Catheterization: 07/07/2016  The left ventricular systolic function is normal.  LV end diastolic pressure is moderately elevated.  The left ventricular ejection fraction is 55-65% by visual estimate.  Ost LAD to Prox LAD lesion, 95 %stenosed.  Prox LAD to Mid LAD lesion, 100 %stenosed.  Ost Cx to Mid Cx lesion, 100 %stenosed.  Prox RCA to Dist RCA lesion, 100 %stenosed.  SVG graft was visualized by angiography and is normal in caliber and anatomically normal.  LIMA graft was visualized by angiography and is normal in caliber and anatomically normal.  SVG graft was visualized by angiography and is large.  Origin to Prox Graft lesion, 100 %stenosed.  A STENT SYNERGY DES 4X20 drug eluting stent was successfully placed.  Post intervention, there is a 0% residual stenosis.   1. Severe 3 vessel obstructive CAD 2. Patent LIMA to the LAD 3. Occluded SVG to the OM. This is the culprit 4. Patent SVG to PDA 5. Good LV function 6. Elevated LVEDP 7. Successful PCI of the SVG to OM with DES   Plan: DAPT for one year. Risk factor modification     ASSESSMENT:    1. Claudication ([place])   2. Coronary artery disease of bypass graft of native heart with stable angina pectoris ([place])   3. HYPERTENSION, BENIGN ESSENTIAL   4. TOBACCO ABUSE   5. Dyslipidemia   6. Poor compliance   7. Gastroesophageal reflux disease, esophagitis presence not specified      PLAN:  In order of problems listed above:  CAD status post STEMI with V. fib, shocked 1 treated with DES to the SVG to the OM. Patent LIMA to the LAD, patent SVG to the PDA, normal  LV function. Patient continues to have chronic angina that is unchanged from prior to his MI.  It's not as bad as when he had his MI but he's been dealing with it ever since his CABG. He has not needed nitroglycerin. We'll lab Imdur 30 mg once daily. Follow-up with Dr. Angelena Form 2 months  Claudication symptoms patient can walk a very short distance before developing severe pain in his calf size in buttocks. We'll order arterial Dopplers of the lower extremities to rule out significant stenosis. He does have palpable distal pulses.  Hypertension controlled  Tobacco abuse continues to smoke one pack per day. Smoking cessation discussed.   Dyslipidemia continue Lipitor  Indigestion with history of pancreatitis and GERD. We'll refill Protonix. Recommend he keep his appointment with GI in March.  Poor compliance secondary to being on disability        Medication Adjustments/Labs and Tests Ordered: Current medicines are reviewed at length with the patient today.  Concerns regarding medicines are outlined above.  Medication changes, Labs and Tests ordered today are listed in the Patient Instructions below. Patient Instructions  Medication Instructions:  Your physician has recommended you make the following change in your medication:  START Imdur 30mg  daily. An Rx has been sent to your pharmacy  A refill for Protonix has been sent to your pharmacy   Labwork: None ordered  Testing/Procedures: Your physician has requested that you have a lower extremity arterial exercise duplex. During this test, exercise and ultrasound are used to evaluate arterial blood flow in the legs. Allow one hour for this exam. There are no restrictions or special instructions.   Follow-Up: Your physician recommends that you schedule a follow-up appointment in: 2 months with Dr.Mcalhany   Any Other Special Instructions Will Be Listed Below (If Applicable). Your physician discussed the hazards of tobacco use.  Tobacco use cessation is recommended and techniques and options to help you quit were discussed.       If you need a refill on your cardiac medications before your next appointment, please call your pharmacy.      Sumner Boast, PA-C  08/18/2016 11:17 AM    [facility] [address], [place], [place]  [address] Phone: [phone]; Fax: [phone]

## 2016-08-18 NOTE — Patient Instructions (Signed)
Medication Instructions:  Your physician has recommended you make the following change in your medication:  START Imdur 30mg  daily. An Rx has been sent to your pharmacy  A refill for Protonix has been sent to your pharmacy   Labwork: None ordered  Testing/Procedures: Your physician has requested that you have a lower extremity arterial exercise duplex. During this test, exercise and ultrasound are used to evaluate arterial blood flow in the legs. Allow one hour for this exam. There are no restrictions or special instructions.   Follow-Up: Your physician recommends that you schedule a follow-up appointment in: 2 months with Dr.Mcalhany   Any Other Special Instructions Will Be Listed Below (If Applicable). Your physician discussed the hazards of tobacco use. Tobacco use cessation is recommended and techniques and options to help you quit were discussed.       If you need a refill on your cardiac medications before your next appointment, please call your pharmacy.

## 2016-09-09 ENCOUNTER — Ambulatory Visit (HOSPITAL_COMMUNITY)
Admission: RE | Admit: 2016-09-09 | Discharge: 2016-09-09 | Disposition: A | Discharge status: Home / Self Care | Payer: Medicaid Other | Source: Ambulatory Visit | Attending: Physician Assistant | Admitting: Physician Assistant

## 2016-09-09 DIAGNOSIS — I10 Essential (primary) hypertension: Secondary | ICD-10-CM | POA: Diagnosis not present

## 2016-09-09 DIAGNOSIS — E785 Hyperlipidemia, unspecified: Secondary | ICD-10-CM | POA: Insufficient documentation

## 2016-09-09 DIAGNOSIS — Z951 Presence of aortocoronary bypass graft: Secondary | ICD-10-CM | POA: Diagnosis not present

## 2016-09-09 DIAGNOSIS — I739 Peripheral vascular disease, unspecified: Secondary | ICD-10-CM

## 2016-09-09 DIAGNOSIS — I2510 Atherosclerotic heart disease of native coronary artery without angina pectoris: Secondary | ICD-10-CM | POA: Insufficient documentation

## 2016-09-09 DIAGNOSIS — R938 Abnormal findings on diagnostic imaging of other specified body structures: Secondary | ICD-10-CM | POA: Diagnosis not present

## 2016-09-09 DIAGNOSIS — Z720 Tobacco use: Secondary | ICD-10-CM | POA: Diagnosis not present

## 2016-09-15 ENCOUNTER — Encounter: Payer: Self-pay | Admitting: Cardiovascular Disease

## 2016-09-15 ENCOUNTER — Ambulatory Visit (INDEPENDENT_AMBULATORY_CARE_PROVIDER_SITE_OTHER): Payer: Self-pay | Admitting: Cardiovascular Disease

## 2016-09-15 VITALS — BP 126/66 | HR 63 | Ht 71.0 in | Wt 227.0 lb

## 2016-09-15 DIAGNOSIS — I2510 Atherosclerotic heart disease of native coronary artery without angina pectoris: Secondary | ICD-10-CM

## 2016-09-15 DIAGNOSIS — Z01812 Encounter for preprocedural laboratory examination: Secondary | ICD-10-CM

## 2016-09-15 DIAGNOSIS — Z720 Tobacco use: Secondary | ICD-10-CM

## 2016-09-15 DIAGNOSIS — I739 Peripheral vascular disease, unspecified: Secondary | ICD-10-CM

## 2016-09-15 LAB — CBC
HCT: 41.2 % (ref 38.5–50.0)
Hemoglobin: 12.8 g/dL — ABNORMAL LOW (ref 13.2–17.1)
MCH: 25.7 pg — AB (ref 27.0–33.0)
MCHC: 31.1 g/dL — ABNORMAL LOW (ref 32.0–36.0)
MCV: 82.7 fL (ref 80.0–100.0)
MPV: 10.1 fL (ref 7.5–12.5)
PLATELETS: 221 10*3/uL (ref 140–400)
RBC: 4.98 MIL/uL (ref 4.20–5.80)
RDW: 19.1 % — AB (ref 11.0–15.0)
WBC: 7 10*3/uL (ref 3.8–10.8)

## 2016-09-15 LAB — BASIC METABOLIC PANEL
BUN: 12 mg/dL (ref 7–25)
CALCIUM: 8.4 mg/dL — AB (ref 8.6–10.3)
CO2: 26 mmol/L (ref 20–31)
CREATININE: 0.86 mg/dL (ref 0.70–1.33)
Chloride: 101 mmol/L (ref 98–110)
GLUCOSE: 97 mg/dL (ref 65–99)
Potassium: 4.8 mmol/L (ref 3.5–5.3)
Sodium: 135 mmol/L (ref 135–146)

## 2016-09-15 NOTE — Progress Notes (Signed)
Cardiology Office Note   Date:  09/19/2016   ID:  [name]., DOB 1961-04-08, MRN [id number]  PCP:  No PCP Per Patient  Cardiologist:  Dr. Angelena Form  Chief Complaint  Patient presents with  . Appointment    regular angina. SHOB. tiredness and fatigue. pain in legs, follow up from testing.       History of Present Illness: [name]. is a 56 y.o. male who Was referred by Dr. Angelena Form for evaluation and management of peripheral arterial disease. He has known history of coronary artery disease status post CABG in 2010 and PCI on SVG to OM later that year, hypertension, hyperlipidemia, chronic diastolic heart failure, polysubstance abuse and multiple other comorbidities. He was hospitalized in November for ST elevation myocardial infarction complicated by ventricular fibrillation. Cardiac cath showed occluded SVG to OM which was treated successfully with PCI and drug-eluting stent placement. He has done well from a cardiac standpoint since then. However, he is very limited by bilateral leg pain which has been gradual over the last 2 years. The pain starts in the thigh and the lower back area and then radiates to. This happens after walking about 50 feet and it's equal in both sides. He has no rest pain. He underwent lower exam General Doppler which showed an ABI of 0.82 on the right and 0.66 on the left. Triphasic waveforms noted in the common femoral arteries. Duplex showed 50-74% bilateral SFA disease.    Past Medical History:  Diagnosis Date  . Abnormality of gait   . CAD (coronary artery disease)    a. 10/2008 Inferolateral STEMI: 3VD w/ occluded LCX (PTCA)-->CABG x 3 (LIMA->LAD, VG->OM, VG->PDA);  b. 06/2009 NSTEMI/PCI: VG->OM 100 (BMS);  c. 05/2013 native 3VD, 3/3 patent grafts. d. 06/2016: Inferior STEMI w/ occluded SVG-OM (DES placed). LIMA-LAD and SVG-PDA patent.  . Chronic diastolic CHF (congestive heart failure) ([place])    a. 02/2015 Echo: EF 50-55%, distal  septal, apical, inferobasal HK, mild LVH, mild MR, mildly dil LA.  [name] Cocaine use   . Colon polyps    a. 09/2015 colonscopy: multiple sessile polyps, path negative for high grade dysplasia.  . Emphysema (subcutaneous) (surgical) resulting from a procedure 10/2008.   Bleb resected at time of 10/2008 CABG. Marketed emphysema noted on surgical report.  . Esophagitis    a. 2017 EGD: esophagitis, duodenitis.  [name] ETOH abuse   . GERD (gastroesophageal reflux disease)   . Hepatic steatosis   . Hiatal hernia   . Hyperlipidemia   . Hypertension   . Iron deficiency anemia 2010  . Morbid obesity ([place])   . Pancreatitis 06/2015  . Sleep apnea   . Stroke ([place]) 02/2015   a. 02/2015 Carotid U/S: 1-39% bilat ICA stenosis.  . Tobacco abuse    still smokes  . Tubular adenoma of colon     Past Surgical History:  Procedure Laterality Date  . CARDIAC CATHETERIZATION  11/07/2008   EF of 50-55% -- normal LV systolic function, acute inferolateral ST elevation MI,  PTCA of the circumflex artery -- [name]. [name], [name]  . CARDIAC CATHETERIZATION N/A 07/07/2016   Procedure: Left Heart Cath and Cors/Grafts Angiography;  Surgeon: [name], MD;  Location: [facility];  Service: Cardiovascular;  Laterality: N/A;  . CARDIAC CATHETERIZATION N/A 07/07/2016   Procedure: Coronary Stent Intervention;  Surgeon: [name], MD;  Location: [facility];  Service: Cardiovascular;  Laterality: N/A;  . CORONARY ARTERY  BYPASS GRAFT    . LEFT HEART CATHETERIZATION WITH CORONARY/GRAFT ANGIOGRAM N/A 05/15/2013   Procedure: LEFT HEART CATHETERIZATION WITH Beatrix Fetters;  Surgeon: [name], MD;  Location: [facility] LAB;  Service: Cardiovascular;  Laterality: N/A;     Current Outpatient Prescriptions  Medication Sig Dispense Refill  . aspirin 81 MG chewable tablet Chew 1 tablet (81 mg total) by mouth daily.    [name] atorvastatin (LIPITOR) 80 MG tablet Take 1 tablet (80 mg total) by mouth daily  at 6 PM. 30 tablet 11  . clopidogrel (PLAVIX) 75 MG tablet Take 1 tablet (75 mg total) by mouth daily. 30 tablet 11  . isosorbide mononitrate (IMDUR) 30 MG 24 hr tablet Take 1 tablet (30 mg total) by mouth daily. 30 tablet 11  . metoprolol tartrate (LOPRESSOR) 25 MG tablet Take 1 tablet (25 mg total) by mouth 2 (two) times daily. 60 tablet 6  . nitroGLYCERIN (NITROSTAT) 0.4 MG SL tablet Place 1 tablet (0.4 mg total) under the tongue every 5 (five) minutes as needed for chest pain. 25 tablet 2  . pantoprazole (PROTONIX) 40 MG tablet Take 1 tablet (40 mg total) by mouth 2 (two) times daily before a meal. 60 tablet 5   No current facility-administered medications for this visit.     Allergies:   Patient has no known allergies.    Social History:  The patient  reports that he has been smoking Cigarettes.  He has a 30.00 pack-year smoking history. He has never used smokeless tobacco. He reports that he drinks alcohol. He reports that he does not use drugs.   Family History:  The patient's family history includes Colon cancer in his father; Diabetes in his mother; Heart disease in his mother; Heart failure in his mother.    ROS:  Please see the history of present illness.   Otherwise, review of systems are positive for none.   All other systems are reviewed and negative.    PHYSICAL EXAM: VS:  BP 126/66   Pulse 63   Ht 5\' 11"  (1.803 m)   Wt 227 lb (103 kg)   SpO2 95%   BMI 31.66 kg/m  , BMI Body mass index is 31.66 kg/m. GEN: Well nourished, well developed, in no acute distress  HEENT: normal  Neck: no JVD, carotid bruits, or masses Cardiac: RRR; no murmurs, rubs, or gallops,no edema  Respiratory:  clear to auscultation bilaterally, normal work of breathing GI: soft, nontender, nondistended, + BS MS: no deformity or atrophy  Skin: warm and dry, no rash Neuro:  Strength and sensation are intact Psych: euthymic mood, full affect Vascular: Femoral pulse is +2 bilaterally. Posterior  tibial is +2 on the right and +1 on the left. Dorsalis pedis is not palpable.   EKG:  EKG is not ordered today.    Recent Labs: 07/07/2016: ALT 20; TSH 1.895 07/08/2016: Magnesium 2.1 09/15/2016: BUN 12; Creat 0.86; Hemoglobin 12.8; Platelets 221; Potassium 4.8; Sodium 135    Lipid Panel    Component Value Date/Time   CHOL 159 07/08/2016 0532   TRIG 147 07/08/2016 0532   HDL 33 (L) 07/08/2016 0532   CHOLHDL 4.8 07/08/2016 0532   VLDL 29 07/08/2016 0532   LDLCALC 97 07/08/2016 0532   LDLDIRECT 116.0 05/12/2013 1517      Wt Readings from Last 3 Encounters:  09/15/16 227 lb (103 kg)  08/18/16 219 lb 12.8 oz (99.7 kg)  07/09/16 218 lb 7.6 oz (99.1 kg)  No flowsheet data found.    ASSESSMENT AND PLAN:  1.  Peripheral arterial disease with severe bilateral leg claudication: The patient has severe lifestyle limiting leg pain. I'm not entirely sure if his symptoms are all related to peripheral arterial disease as he does complain of lower back and thigh pain with no evidence of inflow disease to explain this. Should there is a possibility of neuropathic pain. Given the severity of his symptoms, I think the best option is to proceed with abdominal aortogram, lower extremity runoff and possible endovascular intervention. I discussed the procedure in details as well as risk and benefits. Plan access via the right common femoral artery. He is already on dual antiplatelet therapy.  2. Coronary artery disease without angina: He is doing well now after stenting of SVG to OM.  3. Tobacco use: He continues to smoke one pack per day. I discussed with him the importance of smoking cessation.    Disposition:   FU with me in 1 month  Signed,  Kathlyn Sacramento, MD  09/19/2016 10:43 AM    [employer]

## 2016-09-15 NOTE — Patient Instructions (Signed)
Medication Instructions:  Your physician recommends that you continue on your current medications as directed. Please refer to the Current Medication list given to you today.  Labwork: Your physician recommends that you have lab work today: BMP, CBC and PT/INR  Testing/Procedures: Your physician has requested that you have a peripheral vascular angiogram. This exam is performed at the hospital. During this exam IV contrast is used to look at arterial blood flow. Please review the information sheet given for details.  Follow-Up: Your physician recommends that you schedule a follow-up appointment in: 3 WEEKS with Dr Arida   Any Other Special Instructions Will Be Listed Below (If Applicable).     If you need a refill on your cardiac medications before your next appointment, please call your pharmacy.   

## 2016-09-16 LAB — PROTIME-INR
INR: 1
PROTHROMBIN TIME: 10.7 s (ref 9.0–11.5)

## 2016-09-23 ENCOUNTER — Ambulatory Visit (HOSPITAL_COMMUNITY)
Admission: RE | Admit: 2016-09-23 | Discharge: 2016-09-23 | Discharge status: Against Medical Advice | Payer: Medicaid Other | Source: Ambulatory Visit | Attending: Cardiovascular Disease | Admitting: Cardiovascular Disease

## 2016-09-23 ENCOUNTER — Encounter (HOSPITAL_COMMUNITY)
Admission: RE | Discharge status: Against Medical Advice | Payer: Self-pay | Source: Ambulatory Visit | Attending: Cardiovascular Disease

## 2016-09-23 ENCOUNTER — Encounter (HOSPITAL_COMMUNITY): Payer: Self-pay | Admitting: General Practice

## 2016-09-23 DIAGNOSIS — Z9119 Patient's noncompliance with other medical treatment and regimen: Secondary | ICD-10-CM | POA: Diagnosis not present

## 2016-09-23 DIAGNOSIS — J439 Emphysema, unspecified: Secondary | ICD-10-CM | POA: Insufficient documentation

## 2016-09-23 DIAGNOSIS — Z7982 Long term (current) use of aspirin: Secondary | ICD-10-CM | POA: Diagnosis not present

## 2016-09-23 DIAGNOSIS — G4730 Sleep apnea, unspecified: Secondary | ICD-10-CM | POA: Diagnosis not present

## 2016-09-23 DIAGNOSIS — I25810 Atherosclerosis of coronary artery bypass graft(s) without angina pectoris: Secondary | ICD-10-CM | POA: Insufficient documentation

## 2016-09-23 DIAGNOSIS — I252 Old myocardial infarction: Secondary | ICD-10-CM | POA: Diagnosis not present

## 2016-09-23 DIAGNOSIS — Z8673 Personal history of transient ischemic attack (TIA), and cerebral infarction without residual deficits: Secondary | ICD-10-CM | POA: Insufficient documentation

## 2016-09-23 DIAGNOSIS — I739 Peripheral vascular disease, unspecified: Secondary | ICD-10-CM | POA: Diagnosis present

## 2016-09-23 DIAGNOSIS — I472 Ventricular tachycardia: Secondary | ICD-10-CM | POA: Insufficient documentation

## 2016-09-23 DIAGNOSIS — F1010 Alcohol abuse, uncomplicated: Secondary | ICD-10-CM | POA: Diagnosis not present

## 2016-09-23 DIAGNOSIS — I5032 Chronic diastolic (congestive) heart failure: Secondary | ICD-10-CM | POA: Insufficient documentation

## 2016-09-23 DIAGNOSIS — K449 Diaphragmatic hernia without obstruction or gangrene: Secondary | ICD-10-CM | POA: Insufficient documentation

## 2016-09-23 DIAGNOSIS — K210 Gastro-esophageal reflux disease with esophagitis: Secondary | ICD-10-CM | POA: Diagnosis not present

## 2016-09-23 DIAGNOSIS — Z833 Family history of diabetes mellitus: Secondary | ICD-10-CM | POA: Diagnosis not present

## 2016-09-23 DIAGNOSIS — I70213 Atherosclerosis of native arteries of extremities with intermittent claudication, bilateral legs: Secondary | ICD-10-CM | POA: Insufficient documentation

## 2016-09-23 DIAGNOSIS — Z955 Presence of coronary angioplasty implant and graft: Secondary | ICD-10-CM | POA: Insufficient documentation

## 2016-09-23 DIAGNOSIS — Z6831 Body mass index (BMI) 31.0-31.9, adult: Secondary | ICD-10-CM | POA: Diagnosis not present

## 2016-09-23 DIAGNOSIS — I6523 Occlusion and stenosis of bilateral carotid arteries: Secondary | ICD-10-CM | POA: Insufficient documentation

## 2016-09-23 DIAGNOSIS — Z7902 Long term (current) use of antithrombotics/antiplatelets: Secondary | ICD-10-CM | POA: Insufficient documentation

## 2016-09-23 DIAGNOSIS — F17210 Nicotine dependence, cigarettes, uncomplicated: Secondary | ICD-10-CM | POA: Insufficient documentation

## 2016-09-23 DIAGNOSIS — I7092 Chronic total occlusion of artery of the extremities: Secondary | ICD-10-CM | POA: Diagnosis not present

## 2016-09-23 DIAGNOSIS — Z8249 Family history of ischemic heart disease and other diseases of the circulatory system: Secondary | ICD-10-CM | POA: Diagnosis not present

## 2016-09-23 DIAGNOSIS — I110 Hypertensive heart disease with heart failure: Secondary | ICD-10-CM | POA: Insufficient documentation

## 2016-09-23 DIAGNOSIS — E785 Hyperlipidemia, unspecified: Secondary | ICD-10-CM | POA: Diagnosis not present

## 2016-09-23 DIAGNOSIS — I70212 Atherosclerosis of native arteries of extremities with intermittent claudication, left leg: Secondary | ICD-10-CM

## 2016-09-23 HISTORY — DX: Homonymous bilateral field defects, right side: H53.461

## 2016-09-23 HISTORY — DX: Anxiety disorder, unspecified: F41.9

## 2016-09-23 HISTORY — DX: Major depressive disorder, single episode, unspecified: F32.9

## 2016-09-23 HISTORY — DX: Depression, unspecified: F32.A

## 2016-09-23 HISTORY — DX: Unspecified chronic bronchitis: J42

## 2016-09-23 HISTORY — PX: ABDOMINAL AORTOGRAM W/LOWER EXTREMITY: CATH118223

## 2016-09-23 HISTORY — DX: Peripheral vascular disease, unspecified: I73.9

## 2016-09-23 HISTORY — PX: PERIPHERAL VASCULAR INTERVENTION: CATH118257

## 2016-09-23 LAB — POCT ACTIVATED CLOTTING TIME
Activated Clotting Time: 230 seconds
Activated Clotting Time: 230 seconds

## 2016-09-23 SURGERY — ABDOMINAL AORTOGRAM W/LOWER EXTREMITY

## 2016-09-23 MED ORDER — ATORVASTATIN CALCIUM 80 MG PO TABS: 80.0000 mg | ORAL_TABLET | Freq: Every day | ORAL | Status: DC

## 2016-09-23 MED ORDER — HEPARIN SODIUM (PORCINE) 1000 UNIT/ML IJ SOLN
INTRAMUSCULAR | Status: AC
  Filled 2016-09-23: qty 1

## 2016-09-23 MED ORDER — MIDAZOLAM HCL 2 MG/2ML IJ SOLN
INTRAMUSCULAR | Status: AC
  Filled 2016-09-23: qty 2

## 2016-09-23 MED ORDER — FENTANYL CITRATE (PF) 100 MCG/2ML IJ SOLN
INTRAMUSCULAR | Status: AC
  Filled 2016-09-23: qty 2

## 2016-09-23 MED ORDER — ASPIRIN 81 MG PO CHEW: 81.0000 mg | CHEWABLE_TABLET | Freq: Every day | ORAL | Status: DC

## 2016-09-23 MED ORDER — NITROGLYCERIN 0.4 MG SL SUBL: 0.4000 mg | SUBLINGUAL_TABLET | SUBLINGUAL | Status: DC | PRN

## 2016-09-23 MED ORDER — FENTANYL CITRATE (PF) 100 MCG/2ML IJ SOLN
INTRAMUSCULAR | Status: DC | PRN
  Administered 2016-09-23 (×4): 50 ug via INTRAVENOUS

## 2016-09-23 MED ORDER — PANTOPRAZOLE SODIUM 40 MG PO TBEC
40.0000 mg | DELAYED_RELEASE_TABLET | Freq: Two times a day (BID) | ORAL | Status: DC
  Administered 2016-09-23: 40 mg via ORAL
  Filled 2016-09-23: qty 1

## 2016-09-23 MED ORDER — HEPARIN (PORCINE) IN NACL 2-0.9 UNIT/ML-% IJ SOLN
INTRAMUSCULAR | Status: DC | PRN
  Administered 2016-09-23: 1000 mL

## 2016-09-23 MED ORDER — METOPROLOL TARTRATE 25 MG PO TABS: 25.0000 mg | ORAL_TABLET | Freq: Two times a day (BID) | ORAL | Status: DC

## 2016-09-23 MED ORDER — SODIUM CHLORIDE 0.9 % IV SOLN: 250.0000 mL | INTRAVENOUS | Status: DC | PRN

## 2016-09-23 MED ORDER — SODIUM CHLORIDE 0.9% FLUSH: 3.0000 mL | Freq: Two times a day (BID) | INTRAVENOUS | Status: DC

## 2016-09-23 MED ORDER — HEPARIN (PORCINE) IN NACL 2-0.9 UNIT/ML-% IJ SOLN
INTRAMUSCULAR | Status: AC
  Filled 2016-09-23: qty 1000

## 2016-09-23 MED ORDER — SODIUM CHLORIDE 0.9 % IV SOLN
INTRAVENOUS | Status: DC
Start: 2016-09-23 — End: 2016-09-23
  Administered 2016-09-23: 07:00:00 via INTRAVENOUS

## 2016-09-23 MED ORDER — HYDRALAZINE HCL 20 MG/ML IJ SOLN
10.0000 mg | Freq: Once | INTRAMUSCULAR | Status: AC
  Administered 2016-09-23: 13:00:00 10 mg via INTRAVENOUS
  Filled 2016-09-23: qty 1

## 2016-09-23 MED ORDER — LIDOCAINE HCL (PF) 1 % IJ SOLN
INTRAMUSCULAR | Status: DC | PRN
  Administered 2016-09-23: 15 mL

## 2016-09-23 MED ORDER — ISOSORBIDE MONONITRATE ER 30 MG PO TB24: 30.0000 mg | ORAL_TABLET | Freq: Every day | ORAL | Status: DC

## 2016-09-23 MED ORDER — ASPIRIN 81 MG PO CHEW: 81.0000 mg | CHEWABLE_TABLET | ORAL | Status: DC

## 2016-09-23 MED ORDER — IODIXANOL 320 MG/ML IV SOLN
INTRAVENOUS | Status: DC | PRN
  Administered 2016-09-23: 155 mL via INTRAVENOUS

## 2016-09-23 MED ORDER — LIDOCAINE HCL (PF) 1 % IJ SOLN
INTRAMUSCULAR | Status: AC
  Filled 2016-09-23: qty 30

## 2016-09-23 MED ORDER — SODIUM CHLORIDE 0.9% FLUSH: 3.0000 mL | INTRAVENOUS | Status: DC | PRN

## 2016-09-23 MED ORDER — MORPHINE SULFATE (PF) 2 MG/ML IV SOLN
2.0000 mg | INTRAVENOUS | Status: DC | PRN
Start: 2016-09-23 — End: 2016-09-23

## 2016-09-23 MED ORDER — HEPARIN SODIUM (PORCINE) 1000 UNIT/ML IJ SOLN
INTRAMUSCULAR | Status: DC | PRN
  Administered 2016-09-23: 3000 [IU] via INTRAVENOUS
  Administered 2016-09-23: 8000 [IU] via INTRAVENOUS

## 2016-09-23 MED ORDER — MIDAZOLAM HCL 2 MG/2ML IJ SOLN
INTRAMUSCULAR | Status: DC | PRN
  Administered 2016-09-23 (×2): 1 mg via INTRAVENOUS

## 2016-09-23 MED ORDER — CLOPIDOGREL BISULFATE 75 MG PO TABS: 75.0000 mg | ORAL_TABLET | Freq: Every day | ORAL | Status: DC

## 2016-09-23 MED ORDER — ACETAMINOPHEN 325 MG PO TABS: 650.0000 mg | ORAL_TABLET | ORAL | Status: DC | PRN

## 2016-09-23 MED ORDER — SODIUM CHLORIDE 0.9 % WEIGHT BASED INFUSION: 1.0000 mL/kg/h | INTRAVENOUS | Status: DC

## 2016-09-23 MED ORDER — ONDANSETRON HCL 4 MG/2ML IJ SOLN: 4.0000 mg | Freq: Four times a day (QID) | INTRAMUSCULAR | Status: DC | PRN

## 2016-09-23 SURGICAL SUPPLY — 24 items
BALLN ARMADA 6X100X135 (BALLOONS) ×4
BALLN IN.PACT DCB 5X150 (BALLOONS) ×8
BALLOON ARMADA 6X100X135 (BALLOONS) ×2 IMPLANT
CATH ANGIO 5F PIGTAIL 65CM (CATHETERS) ×4 IMPLANT
CATH CROSS OVER TEMPO 5F (CATHETERS) IMPLANT
CATH HAWKONE LX EXTENDED TIP (CATHETERS) ×4 IMPLANT
CATH NAVICROSS ANGLED 135CM (MICROCATHETER) ×4 IMPLANT
CATH STRAIGHT 5FR 65CM (CATHETERS) ×4 IMPLANT
DCB IN.PACT 5X150 (BALLOONS) ×4 IMPLANT
DEVICE SPIDERFX EMB PROT 6MM (WIRE) ×4 IMPLANT
GUIDEWIRE ANGLED .035X260CM (WIRE) ×4 IMPLANT
KIT ENCORE 26 ADVANTAGE (KITS) ×4 IMPLANT
KIT PV (KITS) ×4 IMPLANT
SHEATH PINNACLE 5F 10CM (SHEATH) ×4 IMPLANT
SHEATH PINNACLE MP 7F 45CM (SHEATH) ×4 IMPLANT
STENT ABSOLUTE PRO 6X80X135 (Permanent Stent) ×4 IMPLANT
STOPCOCK MORSE 400PSI 3WAY (MISCELLANEOUS) ×4 IMPLANT
SYRINGE MEDRAD AVANTA MACH 7 (SYRINGE) ×4 IMPLANT
TAPE VIPERTRACK RADIOPAQ (MISCELLANEOUS) ×2 IMPLANT
TAPE VIPERTRACK RADIOPAQUE (MISCELLANEOUS) ×4
TRANSDUCER W/STOPCOCK (MISCELLANEOUS) ×4 IMPLANT
TRAY PV CATH (CUSTOM PROCEDURE TRAY) ×4 IMPLANT
TUBING CIL FLEX 10 FLL-RA (TUBING) ×4 IMPLANT
WIRE HITORQ VERSACORE ST 145CM (WIRE) ×4 IMPLANT

## 2016-09-23 NOTE — Care Management Note (Addendum)
Case Management Note  Patient Details  Name: [name]. MRN: [id number] Date of Birth: 1960-08-18  Subjective/Objective:   S/p coronary stent intervention, patient is already on plavix and asa, he does not have any insurance listed, may need assistance with medications, NCM will cont to follow for dc needs.  NCM spoke with patient and MD, patient states he has medications at home and the MD states he is not starting any new meds, Patient does not need ast with any meds today,  NCM gave patient [facility] clinic brochure to call on Monday to make hospital follow apt so he can establish a pcp and get meds at discounted rate if needed.                 Action/Plan:   Expected Discharge Date:                  Expected Discharge Plan:  Home/Self Care  In-House Referral:     Discharge planning Services  CM Consult, Medication Assistance  Post Acute Care Choice:    Choice offered to:     DME Arranged:    DME Agency:     HH Arranged:    HH Agency:     Status of Service:  In process, will continue to follow  If discussed at Long Length of Stay Meetings, dates discussed:    Additional Comments:  [name], RN 09/23/2016, 11:13 AM

## 2016-09-23 NOTE — H&P (View-Only) (Signed)
Cardiology Office Note   Date:  09/19/2016   ID:  [name]., DOB 21-Sep-1960, MRN [id number]  PCP:  No PCP Per Patient  Cardiologist:  Dr. Angelena Form  Chief Complaint  Patient presents with  . Appointment    regular angina. SHOB. tiredness and fatigue. pain in legs, follow up from testing.       History of Present Illness: [name]. is a 56 y.o. male who Was referred by Dr. Angelena Form for evaluation and management of peripheral arterial disease. He has known history of coronary artery disease status post CABG in 2010 and PCI on SVG to OM later that year, hypertension, hyperlipidemia, chronic diastolic heart failure, polysubstance abuse and multiple other comorbidities. He was hospitalized in November for ST elevation myocardial infarction complicated by ventricular fibrillation. Cardiac cath showed occluded SVG to OM which was treated successfully with PCI and drug-eluting stent placement. He has done well from a cardiac standpoint since then. However, he is very limited by bilateral leg pain which has been gradual over the last 2 years. The pain starts in the thigh and the lower back area and then radiates to. This happens after walking about 50 feet and it's equal in both sides. He has no rest pain. He underwent lower exam General Doppler which showed an ABI of 0.82 on the right and 0.66 on the left. Triphasic waveforms noted in the common femoral arteries. Duplex showed 50-74% bilateral SFA disease.    Past Medical History:  Diagnosis Date  . Abnormality of gait   . CAD (coronary artery disease)    a. 10/2008 Inferolateral STEMI: 3VD w/ occluded LCX (PTCA)-->CABG x 3 (LIMA->LAD, VG->OM, VG->PDA);  b. 06/2009 NSTEMI/PCI: VG->OM 100 (BMS);  c. 05/2013 native 3VD, 3/3 patent grafts. d. 06/2016: Inferior STEMI w/ occluded SVG-OM (DES placed). LIMA-LAD and SVG-PDA patent.  . Chronic diastolic CHF (congestive heart failure) ([place])    a. 02/2015 Echo: EF 50-55%, distal  septal, apical, inferobasal HK, mild LVH, mild MR, mildly dil LA.  [name] Cocaine use   . Colon polyps    a. 09/2015 colonscopy: multiple sessile polyps, path negative for high grade dysplasia.  . Emphysema (subcutaneous) (surgical) resulting from a procedure 10/2008.   Bleb resected at time of 10/2008 CABG. Marketed emphysema noted on surgical report.  . Esophagitis    a. 2017 EGD: esophagitis, duodenitis.  [name] ETOH abuse   . GERD (gastroesophageal reflux disease)   . Hepatic steatosis   . Hiatal hernia   . Hyperlipidemia   . Hypertension   . Iron deficiency anemia 2010  . Morbid obesity ([place])   . Pancreatitis 06/2015  . Sleep apnea   . Stroke ([place]) 02/2015   a. 02/2015 Carotid U/S: 1-39% bilat ICA stenosis.  . Tobacco abuse    still smokes  . Tubular adenoma of colon     Past Surgical History:  Procedure Laterality Date  . CARDIAC CATHETERIZATION  11/07/2008   EF of 50-55% -- normal LV systolic function, acute inferolateral ST elevation MI,  PTCA of the circumflex artery -- [name]. [name], [name]  . CARDIAC CATHETERIZATION N/A 07/07/2016   Procedure: Left Heart Cath and Cors/Grafts Angiography;  Surgeon: [name], MD;  Location: [facility];  Service: Cardiovascular;  Laterality: N/A;  . CARDIAC CATHETERIZATION N/A 07/07/2016   Procedure: Coronary Stent Intervention;  Surgeon: [name], MD;  Location: [facility];  Service: Cardiovascular;  Laterality: N/A;  . CORONARY ARTERY  BYPASS GRAFT    . LEFT HEART CATHETERIZATION WITH CORONARY/GRAFT ANGIOGRAM N/A 05/15/2013   Procedure: LEFT HEART CATHETERIZATION WITH Beatrix Fetters;  Surgeon: [name], MD;  Location: [facility];  Service: Cardiovascular;  Laterality: N/A;     Current Outpatient Prescriptions  Medication Sig Dispense Refill  . aspirin 81 MG chewable tablet Chew 1 tablet (81 mg total) by mouth daily.    [name] atorvastatin (LIPITOR) 80 MG tablet Take 1 tablet (80 mg total) by mouth daily  at 6 PM. 30 tablet 11  . clopidogrel (PLAVIX) 75 MG tablet Take 1 tablet (75 mg total) by mouth daily. 30 tablet 11  . isosorbide mononitrate (IMDUR) 30 MG 24 hr tablet Take 1 tablet (30 mg total) by mouth daily. 30 tablet 11  . metoprolol tartrate (LOPRESSOR) 25 MG tablet Take 1 tablet (25 mg total) by mouth 2 (two) times daily. 60 tablet 6  . nitroGLYCERIN (NITROSTAT) 0.4 MG SL tablet Place 1 tablet (0.4 mg total) under the tongue every 5 (five) minutes as needed for chest pain. 25 tablet 2  . pantoprazole (PROTONIX) 40 MG tablet Take 1 tablet (40 mg total) by mouth 2 (two) times daily before a meal. 60 tablet 5   No current facility-administered medications for this visit.     Allergies:   Patient has no known allergies.    Social History:  The patient  reports that he has been smoking Cigarettes.  He has a 30.00 pack-year smoking history. He has never used smokeless tobacco. He reports that he drinks alcohol. He reports that he does not use drugs.   Family History:  The patient's family history includes Colon cancer in his father; Diabetes in his mother; Heart disease in his mother; Heart failure in his mother.    ROS:  Please see the history of present illness.   Otherwise, review of systems are positive for none.   All other systems are reviewed and negative.    PHYSICAL EXAM: VS:  BP 126/66   Pulse 63   Ht 5\' 11"  (1.803 m)   Wt 227 lb (103 kg)   SpO2 95%   BMI 31.66 kg/m  , BMI Body mass index is 31.66 kg/m. GEN: Well nourished, well developed, in no acute distress  HEENT: normal  Neck: no JVD, carotid bruits, or masses Cardiac: RRR; no murmurs, rubs, or gallops,no edema  Respiratory:  clear to auscultation bilaterally, normal work of breathing GI: soft, nontender, nondistended, + BS MS: no deformity or atrophy  Skin: warm and dry, no rash Neuro:  Strength and sensation are intact Psych: euthymic mood, full affect Vascular: Femoral pulse is +2 bilaterally. Posterior  tibial is +2 on the right and +1 on the left. Dorsalis pedis is not palpable.   EKG:  EKG is not ordered today.    Recent Labs: 07/07/2016: ALT 20; TSH 1.895 07/08/2016: Magnesium 2.1 09/15/2016: BUN 12; Creat 0.86; Hemoglobin 12.8; Platelets 221; Potassium 4.8; Sodium 135    Lipid Panel    Component Value Date/Time   CHOL 159 07/08/2016 0532   TRIG 147 07/08/2016 0532   HDL 33 (L) 07/08/2016 0532   CHOLHDL 4.8 07/08/2016 0532   VLDL 29 07/08/2016 0532   LDLCALC 97 07/08/2016 0532   LDLDIRECT 116.0 05/12/2013 1517      Wt Readings from Last 3 Encounters:  09/15/16 227 lb (103 kg)  08/18/16 219 lb 12.8 oz (99.7 kg)  07/09/16 218 lb 7.6 oz (99.1 kg)  No flowsheet data found.    ASSESSMENT AND PLAN:  1.  Peripheral arterial disease with severe bilateral leg claudication: The patient has severe lifestyle limiting leg pain. I'm not entirely sure if his symptoms are all related to peripheral arterial disease as he does complain of lower back and thigh pain with no evidence of inflow disease to explain this. Should there is a possibility of neuropathic pain. Given the severity of his symptoms, I think the best option is to proceed with abdominal aortogram, lower extremity runoff and possible endovascular intervention. I discussed the procedure in details as well as risk and benefits. Plan access via the right common femoral artery. He is already on dual antiplatelet therapy.  2. Coronary artery disease without angina: He is doing well now after stenting of SVG to OM.  3. Tobacco use: He continues to smoke one pack per day. I discussed with him the importance of smoking cessation.    Disposition:   FU with me in 1 month  Signed,  Kathlyn Sacramento, MD  09/19/2016 10:43 AM    [place]

## 2016-09-23 NOTE — Discharge Summary (Signed)
Discharge Summary    Patient ID: [name].,  MRN: [id number], DOB/AGE: 1961-07-04 56 y.o.  Admit date: 09/23/2016 Discharge date: 09/23/2016  Primary Care Provider: No PCP Per Patient Primary Cardiologist: Dr. Angelena Form  Discharge Diagnoses    Active Problems:   PAD (peripheral artery disease) ([place])    History of Present Illness     [name]. is a 56 y.o. male with past medical history of CAD (s/p CABG x3 in 2010, BMS to SVG-OM 06/2009, cath in 06/2016 showing patent LIMA-LAD, occluded SVG-OM, and patent SVG-PDA), HTN, HLD, chronic diastolic CHF, cocaine use, pancreatitis, and medical noncompliance who was referred to see Dr. Fletcher Anon for bilateral leg pain.   Reported the pain would start with walking 50 ft and was equal in both sides. Dopplers showed ABI of 0.82 on right and 0.66 on left. Duplex showed 50-74% bilateral SFA disease.   Abdominal aortogram was recommended and he presented to [facility] on 09/23/2016 for the procedure.   Hospital Course     Consultants: None  His aortogram showed no significant aortoiliac disease other than occluded internal iliac arteries with an occluded left SFA with 3-vessel runoff before the knee and significant proximal right SFA disease. Successful directional atherectomy of the left SFA was performed followed by drug-coated balloon angioplasty and spot stenting in the mid-segment. Was continued on ASA and Plavix.   It was initially recommended he stay overnight for hydration and he refused. He was seen by Dr. Fletcher Anon who agreed for him to discharge home after bedrest and ambulation.  He ended his bedrest early and took off his heart monitor. Prior to taking this off, he had 12 beats asymptomatic NSVT. Recommended STAT BMET and Mg draw but he refused these. He also did not ambulate with staff and demanded discharge. He was informed this would be considered leaving AMA. He refused to sign the form (please review Progress note  on 2/14 at 1910).  He has follow-up scheduled with Dr. Fletcher Anon within 2 weeks.   ___________  Discharge Vitals Blood pressure (!) 168/88, pulse 64, temperature 97.6 F (36.4 C), temperature source Oral, resp. rate 14, height 5\' 11"  (1.803 m), weight 227 lb (103 kg), SpO2 96 %.  Filed Weights   09/23/16 0632  Weight: 227 lb (103 kg)    Labs & Radiologic Studies     CBC No results for input(s): WBC, NEUTROABS, HGB, HCT, MCV, PLT in the last 72 hours. Basic Metabolic Panel No results for input(s): NA, K, CL, CO2, GLUCOSE, BUN, CREATININE, CALCIUM, MG, PHOS in the last 72 hours. Liver Function Tests No results for input(s): AST, ALT, ALKPHOS, BILITOT, PROT, ALBUMIN in the last 72 hours. No results for input(s): LIPASE, AMYLASE in the last 72 hours. Cardiac Enzymes No results for input(s): CKTOTAL, CKMB, CKMBINDEX, TROPONINI in the last 72 hours. BNP Invalid input(s): POCBNP D-Dimer No results for input(s): DDIMER in the last 72 hours. Hemoglobin A1C No results for input(s): HGBA1C in the last 72 hours. Fasting Lipid Panel No results for input(s): CHOL, HDL, LDLCALC, TRIG, CHOLHDL, LDLDIRECT in the last 72 hours. Thyroid Function Tests No results for input(s): TSH, T4TOTAL, T3FREE, THYROIDAB in the last 72 hours.  Invalid input(s): FREET3  No results found.   Diagnostic Studies/Procedures    Abdominal Aortogram: 09/23/2016 1. No significant aortoiliac disease other than occluded internal iliac arteries. 2. Occluded left SFA with three-vessel runoff below the knee but diffusely diseased anterior tibial artery. 3. Significant  proximal right SFA disease with three-vessel runoff below the knee. 4. Successful directional atherectomy of the left SFA followed by drug-coated balloon angioplasty and spot stenting in the midsegment.  Recommendations: I will observe overnight due to significant pain in the left leg during the procedure. Hydrate overnight. Continue dual antiplatelet  therapy. Right SFA endovascular intervention can be performed if the patient reports good response on the left side.    Disposition   Pt is being discharged home today in good condition.  Follow-up Plans & Appointments    Follow-up Information    [facility] Follow up.   Why:  please call on Monday 2/19  to schedule a hospital follow up apt Contact information: [place] [phone]       Kathlyn Sacramento, MD Follow up on 10/06/2016.   Specialty:  Cardiology Contact information: [address] [place] [address] [place] [address] [phone]          Discharge Instructions    Discharge patient    Complete by:  As directed    After bed rest is finished and patient ambulated.   Discharge disposition:  01-Home or Self Care   Discharge patient date:  09/23/2016      Discharge Medications     Medication List    TAKE these medications   aspirin 81 MG chewable tablet Chew 1 tablet (81 mg total) by mouth daily.   atorvastatin 80 MG tablet Commonly known as:  LIPITOR Take 1 tablet (80 mg total) by mouth daily at 6 PM.   clopidogrel 75 MG tablet Commonly known as:  PLAVIX Take 1 tablet (75 mg total) by mouth daily.   isosorbide mononitrate 30 MG 24 hr tablet Commonly known as:  IMDUR Take 1 tablet (30 mg total) by mouth daily.   metoprolol tartrate 25 MG tablet Commonly known as:  LOPRESSOR Take 1 tablet (25 mg total) by mouth 2 (two) times daily.   nitroGLYCERIN 0.4 MG SL tablet Commonly known as:  NITROSTAT Place 1 tablet (0.4 mg total) under the tongue every 5 (five) minutes as needed for chest pain.   pantoprazole 40 MG tablet Commonly known as:  PROTONIX Take 1 tablet (40 mg total) by mouth 2 (two) times daily before a meal.        Allergies No Known Allergies   Outstanding Labs/Studies   Patient Left AMA  Duration of Discharge Encounter   Greater than 30 minutes  including physician time.  Signed, Erma Heritage, PA-C 09/23/2016, 7:31 PM

## 2016-09-23 NOTE — Progress Notes (Addendum)
Telemetry showed 12 beat run of VT.  Asymptomatic per patient.  Kenneth Simmonds, PA notified, and stat BMET and MAG ordered.  Awaiting phlebotomy but doubtful patient will allow blood draw if he is still here.  Dr. Sallyanne Kuster also informed of the situation.  Patient has been restless and anxious all day, threatening to leave AMA.  Seen by Dr. Fletcher Anon earlier, who agreed to DC home post bedrest and ambulation.  Patient ended his bedrest one hour early (1850), and took off heart monitor.  Getting dressed and demanding to have IVF [place] before it has run it's course. Patient did leave without having labs drawn, being ambulated by staff.  Attempted to have him sign AMA form, which he refused to do.  Form in chart witnessed by two RNs.  Paper copy of VT in chart.  Right groin site examined prior to DC and remained a level 0.

## 2016-09-23 NOTE — Progress Notes (Signed)
Site area: right groin  Site Prior to Removal:  Level 0  Pressure Applied For 20 MINUTES    Minutes Beginning at 1330  Manual:   Yes.    Patient Status During Pull:  STABLE  Post Pull Groin Site:  Level 0  Post Pull Instructions Given:  Yes.    Post Pull Pulses Present:  Yes.    Dressing Applied:  Yes.    Comments:

## 2016-09-23 NOTE — Interval H&P Note (Signed)
History and Physical Interval Note:  09/23/2016 8:34 AM  [name].  has presented today for surgery, with the diagnosis of pad  The various methods of treatment have been discussed with the patient and family. After consideration of risks, benefits and other options for treatment, the patient has consented to  Procedure(s): Abdominal Aortogram w/Lower Extremity (N/A) as a surgical intervention .  The patient's history has been reviewed, patient examined, no change in status, stable for surgery.  I have reviewed the patient's chart and labs.  Questions were answered to the patient's satisfaction.     Kathlyn Sacramento

## 2016-09-24 LAB — HIV ANTIBODY (ROUTINE TESTING W REFLEX): HIV Screen 4th Generation wRfx: NONREACTIVE

## 2016-10-01 ENCOUNTER — Ambulatory Visit: Payer: Medicaid Other | Attending: Internal Medicine | Admitting: Physician Assistant

## 2016-10-01 VITALS — BP 163/104 | HR 73 | Temp 98.6°F | Resp 16 | Wt 228.2 lb

## 2016-10-01 DIAGNOSIS — Z7982 Long term (current) use of aspirin: Secondary | ICD-10-CM | POA: Insufficient documentation

## 2016-10-01 DIAGNOSIS — F329 Major depressive disorder, single episode, unspecified: Secondary | ICD-10-CM | POA: Insufficient documentation

## 2016-10-01 DIAGNOSIS — I110 Hypertensive heart disease with heart failure: Secondary | ICD-10-CM | POA: Insufficient documentation

## 2016-10-01 DIAGNOSIS — Z79899 Other long term (current) drug therapy: Secondary | ICD-10-CM | POA: Diagnosis not present

## 2016-10-01 DIAGNOSIS — E785 Hyperlipidemia, unspecified: Secondary | ICD-10-CM | POA: Insufficient documentation

## 2016-10-01 DIAGNOSIS — F325 Major depressive disorder, single episode, in full remission: Secondary | ICD-10-CM

## 2016-10-01 DIAGNOSIS — I25119 Atherosclerotic heart disease of native coronary artery with unspecified angina pectoris: Secondary | ICD-10-CM

## 2016-10-01 DIAGNOSIS — I472 Ventricular tachycardia: Secondary | ICD-10-CM | POA: Diagnosis not present

## 2016-10-01 DIAGNOSIS — I739 Peripheral vascular disease, unspecified: Secondary | ICD-10-CM | POA: Insufficient documentation

## 2016-10-01 DIAGNOSIS — F17210 Nicotine dependence, cigarettes, uncomplicated: Secondary | ICD-10-CM | POA: Diagnosis not present

## 2016-10-01 DIAGNOSIS — I2510 Atherosclerotic heart disease of native coronary artery without angina pectoris: Secondary | ICD-10-CM | POA: Insufficient documentation

## 2016-10-01 DIAGNOSIS — I5032 Chronic diastolic (congestive) heart failure: Secondary | ICD-10-CM | POA: Insufficient documentation

## 2016-10-01 DIAGNOSIS — I252 Old myocardial infarction: Secondary | ICD-10-CM | POA: Insufficient documentation

## 2016-10-01 DIAGNOSIS — F17200 Nicotine dependence, unspecified, uncomplicated: Secondary | ICD-10-CM

## 2016-10-01 MED ORDER — BUPROPION HCL ER (XL) 150 MG PO TB24
150.0000 mg | ORAL_TABLET | Freq: Every day | ORAL | 1 refills | Status: DC
Start: 2016-10-01 — End: 2016-12-14

## 2016-10-01 MED FILL — ?BUPROPION HCL XL 150 MG TA: 150 | 30 days supply | Qty: 30 | Fill #0

## 2016-10-01 NOTE — Progress Notes (Signed)
[name]  [id number]  [id number]  DOB - 1961/04/17  Chief Complaint  Patient presents with  . Hospitalization Follow-up  . PAD  . Coronary Artery Disease       Subjective:   [name] is a 56 y.o. male here today for establishment of care. He has a complicated past medical history. This includes coronary artery disease with remote coronary bypass grafting/percutaneous coronary intervention/myocardial infarctions. He also has a history of hypertension, hyperlipidemia, peripheral artery disease, chronic diastolic heart failure, polysubstance abuse, tobacco abuse and nonsustained ventricular tachycardia. He recently was seen as an outpatient by Dr. Fletcher Anon for the evaluation of bilateral lower extremity claudication. He underwent ABIs that were abnormal and a duplex showed bilateral SFA disease. He was set up for outpatient arteriogram w/ runoff which was done on February 14. His aortogram showed no significant aortoiliac disease other than occluded internal iliac arteries with an occluded left SFA with 3-vessel runoff before the knee and significant proximal right SFA disease. Successful directional atherectomy of the left SFA was performed followed by drug-coated balloon angioplasty and spot stenting in the mid-segment. Was continued on ASA and Plavix.   He went home the same day. He has been compliant with his medications for the most part. He did not take his medicines today however. His blood pressure is elevated today. He continues to smoke one pack per day of cigarettes. He scored abnormally on the pHq- 9. He still has some claudication-like symptoms bilaterally but does feel some better. He is able to stand for longer periods of time.  Chronic chest pain, on Imdur. Breathing okay. No syncope. No palpitations. No dizziness.  ROS: GEN: denies fever or chills, denies change in weight Skin: denies lesions or rashes HEENT: denies headache, earache, epistaxis, sore throat,  or neck pain LUNGS: denies SHOB, dyspnea, PND, orthopnea CV: +CP, no  palpitations ABD: denies abd pain, N or V EXT: denies muscle spasms or swelling; + pain in lower ext, no weakness NEURO: denies numbness or tingling, denies sz, stroke or TIA   ALLERGIES: No Known Allergies  PAST MEDICAL HISTORY: Past Medical History:  Diagnosis Date  . Abnormality of gait   . Anxiety   . CAD (coronary artery disease)    a. 10/2008 Inferolateral STEMI: 3VD w/ occluded LCX (PTCA)-->CABG x 3 (LIMA->LAD, VG->OM, VG->PDA);  b. 06/2009 NSTEMI/PCI: VG->OM 100 (BMS);  c. 05/2013 native 3VD, 3/3 patent grafts. d. 06/2016: Inferior STEMI w/ occluded SVG-OM (DES placed). LIMA-LAD and SVG-PDA patent.  . Chronic bronchitis ([place])   . Chronic diastolic CHF (congestive heart failure) ([place])    a. 02/2015 Echo: EF 50-55%, distal septal, apical, inferobasal HK, mild LVH, mild MR, mildly dil LA.  [name] Cocaine use   . Colon polyps    a. 09/2015 colonscopy: multiple sessile polyps, path negative for high grade dysplasia.  . Depression   . Emphysema (subcutaneous) (surgical) resulting from a procedure 10/2008.   Bleb resected at time of 10/2008 CABG. Marketed emphysema noted on surgical report.  . Esophagitis    a. 2017 EGD: esophagitis, duodenitis.  [name] ETOH abuse   . GERD (gastroesophageal reflux disease)   . Hemianopia, homonymous, right    "can't see out the right side of either eye since stroke in 2016/pt description 09/23/2016  . Hepatic steatosis   . Hiatal hernia   . Hyperlipidemia   . Hypertension   . Iron deficiency anemia 2010  . Morbid obesity ([place])   . Myocardial infarction "several"  . PAD (  peripheral artery disease) ([place])   . Pancreatitis 06/2015  . Sleep apnea    "don't have a mask" (09/23/2016)  . Stroke ([place]) 02/2015   a. 02/2015 Carotid U/S: 1-39% bilat ICA stenosis; "left me blind" (09/23/2016)  . Tobacco abuse    still smokes  . Tubular adenoma of colon     PAST SURGICAL HISTORY: Past Surgical  History:  Procedure Laterality Date  . ABDOMINAL AORTOGRAM W/LOWER EXTREMITY N/A 09/23/2016   Procedure: Abdominal Aortogram w/Lower Extremity;  Surgeon: Wellington Hampshire, MD;  Location: [facility];  Service: Cardiovascular;  Laterality: N/A;  . CARDIAC CATHETERIZATION  11/07/2008   EF of 50-55% -- normal LV systolic function, acute inferolateral ST elevation MI,  PTCA of the circumflex artery -- [name]. [name], [name]  . CARDIAC CATHETERIZATION N/A 07/07/2016   Procedure: Left Heart Cath and Cors/Grafts Angiography;  Surgeon: [name], MD;  Location: [facility];  Service: Cardiovascular;  Laterality: N/A;  . CARDIAC CATHETERIZATION N/A 07/07/2016   Procedure: Coronary Stent Intervention;  Surgeon: [name], MD;  Location: [facility];  Service: Cardiovascular;  Laterality: N/A;  . CORONARY ANGIOPLASTY    . CORONARY ARTERY BYPASS GRAFT     "CABG X4"  . LEFT HEART CATHETERIZATION WITH CORONARY/GRAFT ANGIOGRAM N/A 05/15/2013   Procedure: LEFT HEART CATHETERIZATION WITH Beatrix Fetters;  Surgeon: [name], MD;  Location[facility];  Service: Cardiovascular;  Laterality: N/A;  . PERIPHERAL VASCULAR ATHERECTOMY Left 09/23/2016  . PERIPHERAL VASCULAR INTERVENTION  09/23/2016   Procedure: Peripheral Vascular Intervention;  Surgeon: Wellington Hampshire, MD;  Location: [facility];  Service: Cardiovascular;;  Verlin Grills    MEDICATIONS AT HOME: Prior to Admission medications   Medication Sig Start Date End Date Taking? Authorizing Provider  aspirin 81 MG chewable tablet Chew 1 tablet (81 mg total) by mouth daily. 07/09/16   Erma Heritage, PA  atorvastatin (LIPITOR) 80 MG tablet Take 1 tablet (80 mg total) by mouth daily at 6 PM. 07/09/16   Erma Heritage, PA  buPROPion (WELLBUTRIN XL) 150 MG 24 hr tablet Take 1 tablet (150 mg total) by mouth daily. 10/01/16   Brayton Caves, PA-C  clopidogrel (PLAVIX) 75 MG tablet Take 1 tablet (75 mg total)  by mouth daily. 07/09/16   Erma Heritage, PA  isosorbide mononitrate (IMDUR) 30 MG 24 hr tablet Take 1 tablet (30 mg total) by mouth daily. 08/18/16 11/16/16  Imogene Burn, PA-C  metoprolol tartrate (LOPRESSOR) 25 MG tablet Take 1 tablet (25 mg total) by mouth 2 (two) times daily. 07/09/16   Erma Heritage, PA  nitroGLYCERIN (NITROSTAT) 0.4 MG SL tablet Place 1 tablet (0.4 mg total) under the tongue every 5 (five) minutes as needed for chest pain. 07/09/16   Erma Heritage, PA  pantoprazole (PROTONIX) 40 MG tablet Take 1 tablet (40 mg total) by mouth 2 (two) times daily before a meal. 08/18/16   Imogene Burn, PA-C     Objective:   Vitals:   10/01/16 1434  BP: (!) 163/104  Pulse: 73  Resp: 16  Temp: 98.6 F (37 C)  TempSrc: Oral  SpO2: 93%  Weight: 228 lb 3.2 oz (103.5 kg)    Exam General appearance : Awake, alert, not in any distress. Speech Clear. Not toxic looking HEENT: Atraumatic and Normocephalic, pupils equally reactive to light and accomodation Neck: supple, no JVD. No cervical lymphadenopathy.  Chest:Good air entry bilaterally,  no added sounds  CVS: S1 S2 regular, no murmurs.  Abdomen: Bowel sounds present, Non tender and not distended with no gaurding, rigidity or rebound. Extremities: B/L Lower Ext shows no edema, both legs are warm to touch, decreased PP Neurology: Awake alert, and oriented X 3, CN II-XII intact, Non focal Skin:No Rash Wounds:N/A  Data Review Lab Results  Component Value Date   HGBA1C 5.8 (H) 07/07/2016   HGBA1C 5.7 (H) 07/07/2016   HGBA1C 5.8 (H) 02/21/2015     Assessment & Plan  1. PAD s/p recent PTI  -cont DAPT (ASA/Plavix)  -RF modification  -smoking cessation  -keep f/u with vasc surg as scheduled 2. Depression  -trial of Wellbutrin (may help with smoking as well)   -declined talk with SW 3. CAD-stable  -cont current meds  -RF modification  -keep f/u with cards as scheduled 4. HTN (did not take meds  today)  -recheck at next visit   -no changes today  Declined SW. Financial counselor appt. Return in about 2 weeks (around 10/15/2016).  The patient was given clear instructions to go to ER or return to medical center if symptoms don't improve, worsen or new problems develop. The patient verbalized understanding. The patient was told to call to get lab results if they haven't heard anything in the next week.   This note has been created with Surveyor, quantity. Any transcriptional errors are unintentional.    Zettie Pho, PA-C [facility] and [facility] [place], [place]   10/01/2016, 2:53 PM

## 2016-10-01 NOTE — Patient Instructions (Signed)
Try to cut back on your smoking, maybe the new medicine will help with this  Keep all appointments as scheduled  Take all medicines everyday as prescribed  Be sure to apply for the [facility] discount and orange card- ask about the applications at the front desk.

## 2016-10-05 LAB — POCT ACTIVATED CLOTTING TIME: Activated Clotting Time: 142 seconds

## 2016-10-06 ENCOUNTER — Ambulatory Visit: Payer: Self-pay | Admitting: Cardiovascular Disease

## 2016-10-12 ENCOUNTER — Ambulatory Visit: Payer: Medicaid Other | Admitting: Internal Medicine

## 2016-10-13 ENCOUNTER — Encounter: Payer: Self-pay | Admitting: Cardiovascular Disease

## 2016-10-13 ENCOUNTER — Ambulatory Visit (INDEPENDENT_AMBULATORY_CARE_PROVIDER_SITE_OTHER): Payer: Self-pay | Admitting: Cardiovascular Disease

## 2016-10-13 VITALS — BP 156/80 | HR 66 | Ht 71.0 in | Wt 227.0 lb

## 2016-10-13 DIAGNOSIS — E785 Hyperlipidemia, unspecified: Secondary | ICD-10-CM

## 2016-10-13 DIAGNOSIS — Z720 Tobacco use: Secondary | ICD-10-CM

## 2016-10-13 DIAGNOSIS — I739 Peripheral vascular disease, unspecified: Secondary | ICD-10-CM

## 2016-10-13 DIAGNOSIS — I2510 Atherosclerotic heart disease of native coronary artery without angina pectoris: Secondary | ICD-10-CM

## 2016-10-13 DIAGNOSIS — I10 Essential (primary) hypertension: Secondary | ICD-10-CM

## 2016-10-13 MED ORDER — VARENICLINE TARTRATE 0.5 MG X 11 & 1 MG X 42 PO MISC: ORAL | 0 refills | Status: DC

## 2016-10-13 MED ORDER — VARENICLINE TARTRATE 1 MG PO TABS: 1.0000 mg | ORAL_TABLET | Freq: Two times a day (BID) | ORAL | 1 refills | Status: DC

## 2016-10-13 MED FILL — ATORVASTATIN 80 MG TABLET: 80 | 30 days supply | Qty: 30 | Fill #0

## 2016-10-13 MED FILL — ?PANTOPRAZOLE SOD DR 40MG: 40 MG | 30 days supply | Qty: 60 | Fill #0

## 2016-10-13 MED FILL — ISOSORBIDE MN ER 30 MG TAB: 30 | 30 days supply | Qty: 30 | Fill #0

## 2016-10-13 NOTE — Patient Instructions (Signed)
Medication Instructions:  Your physician recommends that you continue on your current medications as directed. Please refer to the Current Medication list given to you today.  Prescription given for Chantix starting month pack and then continuing month pack sent into the patient's pharmacy.   Labwork: No new orders.   Testing/Procedures: Your physician has requested that you have a LEFT lower extremity arterial duplex. This test is an ultrasound of the arteries in the legs. It looks at arterial blood flow in the legs. Allow one hour for Lower Arterial scans. There are no restrictions or special instructions  Your physician has requested that you have an ankle brachial index (ABI). During this test an ultrasound and blood pressure cuff are used to evaluate the arteries that supply the arms and legs with blood. Allow thirty minutes for this exam. There are no restrictions or special instructions.  Follow-Up: Your physician wants you to follow-up in: 6 MONTHS with Dr Fletcher Anon.  You will receive a reminder letter in the mail two months in advance. If you don't receive a letter, please call our office to schedule the follow-up appointment.   Any Other Special Instructions Will Be Listed Below (If Applicable).     If you need a refill on your cardiac medications before your next appointment, please call your pharmacy.

## 2016-10-13 NOTE — Progress Notes (Signed)
Cardiology Office Note   Date:  10/13/2016   ID:  Kenneth Romney., DOB 09/06/1960, MRN [id number]  PCP:  No PCP Per Patient  Cardiologist:  Dr. Angelena Form  Chief Complaint  Patient presents with  . Follow-up    3 week from angiogram      History of Present Illness: [name]. is a 56 y.o. male who is here today for a follow-up visit regarding peripheral arterial disease.   He has known history of coronary artery disease status post CABG in 2010 and PCI on SVG to OM later that year, hypertension, hyperlipidemia, chronic diastolic heart failure, polysubstance abuse and multiple other comorbidities. He was hospitalized in November for ST elevation myocardial infarction complicated by ventricular fibrillation. Cardiac cath showed occluded SVG to OM which was treated successfully with PCI and drug-eluting stent placement.  He was seen recently for bilateral leg pain suspicious for claudication. He also complained of lower back discomfort.  He underwent lower exam General Doppler which showed an ABI of 0.82 on the right and 0.66 on the left. Triphasic waveforms noted in the common femoral arteries. Duplex showed 50-74% bilateral SFA disease.  I proceeded with angiography which showed no significant aortoiliac disease other than occluded internal iliac arteries, occluded left SFA with three-vessel runoff below the knee and significant disease affecting the right proximal SFA. I performed successful atherectomy of the left SFA followed by drug-coated balloon angioplasty and stenting of the midsegment. He reports improvement in left leg pain but continues to have lower back pain and he reports no claudication affecting his right leg.   Past Medical History:  Diagnosis Date  . Abnormality of gait   . Anxiety   . CAD (coronary artery disease)    a. 10/2008 Inferolateral STEMI: 3VD w/ occluded LCX (PTCA)-->CABG x 3 (LIMA->LAD, VG->OM, VG->PDA);  b. 06/2009 NSTEMI/PCI: VG->OM 100  (BMS);  c. 05/2013 native 3VD, 3/3 patent grafts. d. 06/2016: Inferior STEMI w/ occluded SVG-OM (DES placed). LIMA-LAD and SVG-PDA patent.  . Chronic bronchitis ([place])   . Chronic diastolic CHF (congestive heart failure) ([place])    a. 02/2015 Echo: EF 50-55%, distal septal, apical, inferobasal HK, mild LVH, mild MR, mildly dil LA.  [name] Cocaine use   . Colon polyps    a. 09/2015 colonscopy: multiple sessile polyps, path negative for high grade dysplasia.  . Depression   . Emphysema (subcutaneous) (surgical) resulting from a procedure 10/2008.   Bleb resected at time of 10/2008 CABG. Marketed emphysema noted on surgical report.  . Esophagitis    a. 2017 EGD: esophagitis, duodenitis.  [name] ETOH abuse   . GERD (gastroesophageal reflux disease)   . Hemianopia, homonymous, right    "can't see out the right side of either eye since stroke in 2016/pt description 09/23/2016  . Hepatic steatosis   . Hiatal hernia   . Hyperlipidemia   . Hypertension   . Iron deficiency anemia 2010  . Morbid obesity ([place])   . Myocardial infarction "several"  . PAD (peripheral artery disease) ([place])   . Pancreatitis 06/2015  . Sleep apnea    "don't have a mask" (09/23/2016)  . Stroke ([place]) 02/2015   a. 02/2015 Carotid U/S: 1-39% bilat ICA stenosis; "left me blind" (09/23/2016)  . Tobacco abuse    still smokes  . Tubular adenoma of colon     Past Surgical History:  Procedure Laterality Date  . ABDOMINAL AORTOGRAM W/LOWER EXTREMITY N/A 09/23/2016   Procedure: Abdominal Aortogram w/Lower Extremity;  Surgeon: Wellington Hampshire, MD;  Location: [facility];  Service: Cardiovascular;  Laterality: N/A;  . CARDIAC CATHETERIZATION  11/07/2008   EF of 50-55% -- normal LV systolic function, acute inferolateral ST elevation MI,  PTCA of the circumflex artery -- [name]. [name], [name]  . CARDIAC CATHETERIZATION N/A 07/07/2016   Procedure: Left Heart Cath and Cors/Grafts Angiography;  Surgeon: [name], MD;  Location: [facility];  Service: Cardiovascular;  Laterality: N/A;  . CARDIAC CATHETERIZATION N/A 07/07/2016   Procedure: Coronary Stent Intervention;  Surgeon: [name], MD;  Location: [facility];  Service: Cardiovascular;  Laterality: N/A;  . CORONARY ANGIOPLASTY    . CORONARY ARTERY BYPASS GRAFT     "CABG X4"  . LEFT HEART CATHETERIZATION WITH CORONARY/GRAFT ANGIOGRAM N/A 05/15/2013   Procedure: LEFT HEART CATHETERIZATION WITH Beatrix Fetters;  Surgeon: [name], MD;  Location: [facility] CATH LAB;  Service: Cardiovascular;  Laterality: N/A;  . PERIPHERAL VASCULAR ATHERECTOMY Left 09/23/2016  . PERIPHERAL VASCULAR INTERVENTION  09/23/2016   Procedure: Peripheral Vascular Intervention;  Surgeon: Wellington Hampshire, MD;  Location: [facility];  Service: Cardiovascular;;  Lt. SFA     Current Outpatient Prescriptions  Medication Sig Dispense Refill  . aspirin 81 MG chewable tablet Chew 1 tablet (81 mg total) by mouth daily.    [name] atorvastatin (LIPITOR) 80 MG tablet Take 1 tablet (80 mg total) by mouth daily at 6 PM. 30 tablet 11  . buPROPion (WELLBUTRIN XL) 150 MG 24 hr tablet Take 1 tablet (150 mg total) by mouth daily. 30 tablet 1  . clopidogrel (PLAVIX) 75 MG tablet Take 1 tablet (75 mg total) by mouth daily. 30 tablet 11  . isosorbide mononitrate (IMDUR) 30 MG 24 hr tablet Take 1 tablet (30 mg total) by mouth daily. 30 tablet 11  . metoprolol tartrate (LOPRESSOR) 25 MG tablet Take 1 tablet (25 mg total) by mouth 2 (two) times daily. 60 tablet 6  . nitroGLYCERIN (NITROSTAT) 0.4 MG SL tablet Place 1 tablet (0.4 mg total) under the tongue every 5 (five) minutes as needed for chest pain. 25 tablet 2  . pantoprazole (PROTONIX) 40 MG tablet Take 1 tablet (40 mg total) by mouth 2 (two) times daily before a meal. 60 tablet 5   No current facility-administered medications for this visit.     Allergies:   Patient has no known allergies.    Social History:  The patient   reports that he has been smoking Cigarettes.  He has a 43.00 pack-year smoking history. He has never used smokeless tobacco. He reports that he drinks about 8.4 oz of alcohol per week . He reports that he does not use drugs.   Family History:  The patient's family history includes Colon cancer in his father; Diabetes in his mother; Heart disease in his mother; Heart failure in his mother.    ROS:  Please see the history of present illness.   Otherwise, review of systems are positive for none.   All other systems are reviewed and negative.    PHYSICAL EXAM: VS:  BP (!) 156/80   Pulse 66   Ht 5\' 11"  (1.803 m)   Wt 227 lb (103 kg)   BMI 31.66 kg/m  , BMI Body mass index is 31.66 kg/m. GEN: Well nourished, well developed, in no acute distress  HEENT: normal  Neck: no JVD, carotid bruits, or masses Cardiac: RRR; no murmurs, rubs, or gallops,no edema  Respiratory:  clear to auscultation bilaterally, normal work of breathing GI: soft, nontender, nondistended, + BS MS: no deformity or atrophy  Skin: warm and dry, no rash Neuro:  Strength and sensation are intact Psych: euthymic mood, full affect Vascular: Femoral pulse is +2 bilaterally. Posterior tibial is +2 on the right and +2 on the left. Dorsalis pedis is not palpable. No groin hematoma   EKG:  EKG is not ordered today.    Recent Labs: 07/07/2016: ALT 20; TSH 1.895 07/08/2016: Magnesium 2.1 09/15/2016: BUN 12; Creat 0.86; Hemoglobin 12.8; Platelets 221; Potassium 4.8; Sodium 135    Lipid Panel    Component Value Date/Time   CHOL 159 07/08/2016 0532   TRIG 147 07/08/2016 0532   HDL 33 (L) 07/08/2016 0532   CHOLHDL 4.8 07/08/2016 0532   VLDL 29 07/08/2016 0532   LDLCALC 97 07/08/2016 0532   LDLDIRECT 116.0 05/12/2013 1517      Wt Readings from Last 3 Encounters:  10/13/16 227 lb (103 kg)  10/01/16 228 lb 3.2 oz (103.5 kg)  09/23/16 227 lb (103 kg)       No flowsheet data found.    ASSESSMENT AND PLAN:  1.   Peripheral arterial disease with severe bilateral leg claudication:  Status post recent left SFA endovascular intervention with good results. His posterior tibial pulses palpable on the left. I recommend a follow-up ABI in left lower extremity arterial duplex. Due to the patient's extensive peripheral arterial disease and coronary artery disease, consider lifelong dual antiplatelet therapy. He has significant right SFA stenosis but currently with no right calf claudication. Thus, revascularization is not advisable. He continues to have lower back pain which I suspect is due to spinal problem and not peripheral artery disease.  2. Coronary artery disease without angina: He is doing well now after stenting of SVG to OM.  3. Tobacco use: He continues to smoke one pack per day. I discussed with him the importance of smoking cessation. He wants to quit smoking but he needs help with this. Thus, I prescribed Chantix.  4. Hyperlipidemia: Continue high-dose atorvastatin with a target LDL of less than 70.  Disposition:   FU with me in 6 months  Signed,  Kathlyn Sacramento, MD  10/13/2016 11:42 AM    [place]

## 2016-10-15 ENCOUNTER — Other Ambulatory Visit: Payer: Self-pay | Admitting: Pharmacist

## 2016-10-15 MED ORDER — METOPROLOL TARTRATE 25 MG PO TABS: 25.0000 mg | ORAL_TABLET | Freq: Two times a day (BID) | ORAL | 0 refills | Status: DC

## 2016-10-15 MED ORDER — CLOPIDOGREL BISULFATE 75 MG PO TABS: 75.0000 mg | ORAL_TABLET | Freq: Every day | ORAL | 0 refills | Status: DC

## 2016-10-15 MED ORDER — NITROGLYCERIN 0.4 MG SL SUBL: 0.4000 mg | SUBLINGUAL_TABLET | SUBLINGUAL | 0 refills | Status: DC | PRN

## 2016-10-15 MED FILL — NITROSTAT 0.4 MG TABLET SL: 0.4 | 10 days supply | Qty: 25 | Fill #0

## 2016-10-15 MED FILL — CLOPIDOGREL 75 MG TABLET: 75 | 30 days supply | Qty: 30 | Fill #0

## 2016-10-15 MED FILL — METOPROLOL TARTRATE 25 MG T: 25 | 30 days supply | Qty: 60 | Fill #0

## 2016-10-19 ENCOUNTER — Ambulatory Visit: Payer: Medicaid Other | Admitting: Internal Medicine

## 2016-10-20 ENCOUNTER — Other Ambulatory Visit: Payer: Self-pay | Admitting: Cardiovascular Disease

## 2016-10-20 DIAGNOSIS — I739 Peripheral vascular disease, unspecified: Secondary | ICD-10-CM

## 2016-10-22 ENCOUNTER — Encounter: Payer: Self-pay | Admitting: Cardiovascular Disease

## 2016-11-02 ENCOUNTER — Ambulatory Visit (INDEPENDENT_AMBULATORY_CARE_PROVIDER_SITE_OTHER): Payer: Self-pay | Admitting: Cardiovascular Disease

## 2016-11-02 ENCOUNTER — Encounter (INDEPENDENT_AMBULATORY_CARE_PROVIDER_SITE_OTHER): Payer: Self-pay

## 2016-11-02 ENCOUNTER — Encounter: Payer: Self-pay | Admitting: Cardiovascular Disease

## 2016-11-02 VITALS — BP 138/80 | HR 68 | Ht 71.0 in | Wt 225.0 lb

## 2016-11-02 DIAGNOSIS — Z720 Tobacco use: Secondary | ICD-10-CM

## 2016-11-02 DIAGNOSIS — I10 Essential (primary) hypertension: Secondary | ICD-10-CM

## 2016-11-02 DIAGNOSIS — I2510 Atherosclerotic heart disease of native coronary artery without angina pectoris: Secondary | ICD-10-CM

## 2016-11-02 MED ORDER — METOPROLOL TARTRATE 25 MG PO TABS: 25.0000 mg | ORAL_TABLET | Freq: Two times a day (BID) | ORAL | 11 refills | Status: DC

## 2016-11-02 MED ORDER — CLOPIDOGREL BISULFATE 75 MG PO TABS: 75.0000 mg | ORAL_TABLET | Freq: Every day | ORAL | 11 refills | Status: DC

## 2016-11-02 NOTE — Patient Instructions (Signed)

## 2016-11-02 NOTE — Progress Notes (Signed)
Chief Complaint  Patient presents with  . Coronary Artery Disease    still has some leg pain when walking. Has seen Dr.Arida       History of Present Illness: 56 year old male with history of HTN, hyperlipidemia, tobacco abuse, former cocaine abuse, PAD and CAD here today for cardiac follow up. He initially presented with an inferolateral STEMI in March 2010 at which time his Circumflex was occluded and was opened with a balloon. He underwent emergent 3V CABG (LIMA to LAD, SVG to Circumflex, SVG to PDA) given his diffuse multivessel disease. In November 2010 he presented with a NSTEMI and had bare metal stenting of the totally occluded saphenous vein graft to the obtuse marginal artery. Cardiac cath October 2014 with 3 patent bypass grafts. Admitted July 2016 to [facility] with CVA. Echo 02/21/15 with normal LV function. Carotid dopplers July 2016 with mild bilateral carotid disease. Ad[facility] to [facility] 07/07/16 with lateral STEMI secondary to acute occlusion of the SVG to the OM. A Synergy DES was placed in the body of the SVG to the OM. Normal LV function by LV gram. He had dyspnea with Brilinta and was discharged on ASA and Plavix. He was referred to DR. Arida for PV workup. Lower ext angiogram 09/23/16 with occluded left SFA which was treated with atherectomy, drug coated balloon angioplasty and stenting. Moderate disease in right SFA.   He is here today for follow up. He is feeling ok. No chest pain or SOB. He continues to smoke. He is trying to stop.   Primary Care Physician: Angelica Chessman, MD   Past Medical History:  Diagnosis Date  . Abnormality of gait   . Anxiety   . CAD (coronary artery disease)    a. 10/2008 Inferolateral STEMI: 3VD w/ occluded LCX (PTCA)-->CABG x 3 (LIMA->LAD, VG->OM, VG->PDA);  b. 06/2009 NSTEMI/PCI: VG->OM 100 (BMS);  c. 05/2013 native 3VD, 3/3 patent grafts. d. 06/2016: Inferior STEMI w/ occluded SVG-OM (DES placed). LIMA-LAD and SVG-PDA patent.  . Chronic bronchitis  ([place])   . Chronic diastolic CHF (congestive heart failure) ([place])    a. 02/2015 Echo: EF 50-55%, distal septal, apical, inferobasal HK, mild LVH, mild MR, mildly dil LA.  [name] Cocaine use   . Colon polyps    a. 09/2015 colonscopy: multiple sessile polyps, path negative for high grade dysplasia.  . Depression   . Emphysema (subcutaneous) (surgical) resulting from a procedure 10/2008.   Bleb resected at time of 10/2008 CABG. Marketed emphysema noted on surgical report.  . Esophagitis    a. 2017 EGD: esophagitis, duodenitis.  [name] ETOH abuse   . GERD (gastroesophageal reflux disease)   . Hemianopia, homonymous, right    "can't see out the right side of either eye since stroke in 2016/pt description 09/23/2016  . Hepatic steatosis   . Hiatal hernia   . Hyperlipidemia   . Hypertension   . Iron deficiency anemia 2010  . Morbid obesity ([place])   . Myocardial infarction "several"  . PAD (peripheral artery disease) ([place])   . Pancreatitis 06/2015  . Sleep apnea    "don't have a mask" (09/23/2016)  . Stroke ([place]) 02/2015   a. 02/2015 Carotid U/S: 1-39% bilat ICA stenosis; "left me blind" (09/23/2016)  . Tobacco abuse    still smokes  . Tubular adenoma of colon     Past Surgical History:  Procedure Laterality Date  . ABDOMINAL AORTOGRAM W/LOWER EXTREMITY N/A 09/23/2016   Procedure: Abdominal Aortogram w/Lower Extremity;  Surgeon: Mertie Clause  Fletcher Anon, MD;  Location: [place] [facility];  Service: Cardiovascular;  Laterality: N/A;  . CARDIAC CATHETERIZATION  11/07/2008   EF of 50-55% -- normal LV systolic function, acute inferolateral ST elevation MI,  PTCA of the circumflex artery -- [name]. [name], [name]  . CARDIAC CATHETERIZATION N/A 07/07/2016   Procedure: Left Heart Cath and Cors/Grafts Angiography;  Surgeon: [name], MD;  Location: [place] [facility];  Service: Cardiovascular;  Laterality: N/A;  . CARDIAC CATHETERIZATION N/A 07/07/2016   Procedure: Coronary Stent Intervention;  Surgeon: Peter M  Martinique, MD;  Location: [place] [facility];  Service: Cardiovascular;  Laterality: N/A;  . CORONARY ANGIOPLASTY    . CORONARY ARTERY BYPASS GRAFT     "CABG X4"  . LEFT HEART CATHETERIZATION WITH CORONARY/GRAFT ANGIOGRAM N/A 05/15/2013   Procedure: LEFT HEART CATHETERIZATION WITH Beatrix Fetters;  Surgeon: [name], MD;  Location: [facility];  Service: Cardiovascular;  Laterality: N/A;  . PERIPHERAL VASCULAR ATHERECTOMY Left 09/23/2016  . PERIPHERAL VASCULAR INTERVENTION  09/23/2016   Procedure: Peripheral Vascular Intervention;  Surgeon: Wellington Hampshire, MD;  Location: [place] [facility];  Service: Cardiovascular;;  Lt. SFA    Current Outpatient Prescriptions  Medication Sig Dispense Refill  . aspirin 81 MG chewable tablet Chew 1 tablet (81 mg total) by mouth daily.    [name] atorvastatin (LIPITOR) 80 MG tablet Take 1 tablet (80 mg total) by mouth daily at 6 PM. 30 tablet 11  . buPROPion (WELLBUTRIN XL) 150 MG 24 hr tablet Take 1 tablet (150 mg total) by mouth daily. 30 tablet 1  . clopidogrel (PLAVIX) 75 MG tablet Take 1 tablet (75 mg total) by mouth daily. 30 tablet 11  . isosorbide mononitrate (IMDUR) 30 MG 24 hr tablet Take 1 tablet (30 mg total) by mouth daily. 30 tablet 11  . metoprolol tartrate (LOPRESSOR) 25 MG tablet Take 1 tablet (25 mg total) by mouth 2 (two) times daily. 60 tablet 11  . nitroGLYCERIN (NITROSTAT) 0.4 MG SL tablet Place 1 tablet (0.4 mg total) under the tongue every 5 (five) minutes as needed for chest pain. 25 tablet 0  . pantoprazole (PROTONIX) 40 MG tablet Take 1 tablet (40 mg total) by mouth 2 (two) times daily before a meal. 60 tablet 5   No current facility-administered medications for this visit.     No Known Allergies  Social History   Social History  . Marital status: Divorced    Spouse name: N/A  . Number of children: 2  . Years of education: N/A   Occupational History  . not working , used to be a Chief Executive Officer     Social  History Main Topics  . Smoking status: Current Every Day Smoker    Packs/day: 1.00    Years: 43.00    Types: Cigarettes  . Smokeless tobacco: Never Used  . Alcohol use 8.4 oz/week    14 Cans of beer per week     Comment: 09/23/2016 "couple beers/day"  . Drug use: No     Comment: 09/23/2016 "none in years"  . Sexual activity: Not Currently   Other Topics Concern  . Not on file   Social History Narrative   Lives w/ girlfriend     Family History  Problem Relation Age of Onset  . Heart disease Mother   . Heart failure Mother   . Diabetes Mother   . Colon cancer Father     late 47s  . Prostate cancer Neg  Hx     Review of Systems:  As stated in the HPI and otherwise negative.   BP 138/80   Pulse 68   Ht 5\' 11"  (1.803 m)   Wt 225 lb (102.1 kg)   BMI 31.38 kg/m   Physical Examination: General: Well developed, well nourished, NAD  HEENT: OP clear, mucus membranes moist  SKIN: warm, dry. No rashes. Neuro: No focal deficits  Musculoskeletal: Muscle strength 5/5 all ext  Psychiatric: Mood and affect normal  Neck: No JVD, no carotid bruits, no thyromegaly, no lymphadenopathy.  Lungs:Clear bilaterally, no wheezes, rhonci, crackles Cardiovascular: Regular rate and rhythm. No murmurs, gallops or rubs. Abdomen:Soft. Bowel sounds present. Non-tender.  Extremities: No lower extremity edema. Pulses are 2 + in the bilateral DP/PT.  Echo 02/21/15: Procedure narrative: Transthoracic echocardiography. Image quality was adequate. The study was technically difficult due to variations in respirations. - Left ventricle: Distal septal, apical and inferobasal hypokinesis The cavity size was normal. Wall thickness was increased in a pattern of mild LVH. Systolic function was normal. The estimated ejection fraction was in the range of 50% to 55%. - Mitral valve: There was mild regurgitation. - Left atrium: The atrium was mildly dilated. - Atrial septum: No defect or patent  foramen ovale was identified.  EKG:  EKG is not ordered today. The ekg ordered today demonstrates   Recent Labs: 07/07/2016: ALT 20; TSH 1.895 07/08/2016: Magnesium 2.1 09/15/2016: BUN 12; Creat 0.86; Hemoglobin 12.8; Platelets 221; Potassium 4.8; Sodium 135   Lipid Panel    Component Value Date/Time   CHOL 159 07/08/2016 0532   TRIG 147 07/08/2016 0532   HDL 33 (L) 07/08/2016 0532   CHOLHDL 4.8 07/08/2016 0532   VLDL 29 07/08/2016 0532   LDLCALC 97 07/08/2016 0532   LDLDIRECT 116.0 05/12/2013 1517     Wt Readings from Last 3 Encounters:  11/02/16 225 lb (102.1 kg)  10/13/16 227 lb (103 kg)  10/01/16 228 lb 3.2 oz (103.5 kg)     Other studies Reviewed: Additional studies/ records that were reviewed today include: . Review of the above records demonstrates:    Assessment and Plan:   1. CAD without angina: No chest pain suggestive of angina. He is s/p 3V CABG in March 2010 and has had bare metal stent to SVG to Circumflex in November 2010 as well as recent DES placed in the body of the SVG to OM in November 2017 in the setting of lateral STEMI. LV function is normal by LV gram 2017. Will continue ASA, Plavix, statin and beta blocker.   2. HTN: BP controlled. No changes.   3. Tobacco abuse: Cessation recommended. Ten minutes counseling to stop outlining risk of continued use. His CAD will continue to progress rapidly as long as he smokes. He is willing to try Chantix and is trying to get assistance through the [facility] and [facility].   Current medicines are reviewed at length with the patient today.  The patient does not have concerns regarding medicines.  The following changes have been made:  no change  Labs/ tests ordered today include:  No orders of the defined types were placed in this encounter.   Disposition:   FU with me in 6  months  Signed, [name], MD 11/02/2016 4:21 PM    [facility] [place],  [place], [place]  [address] Phone: [phone]; Fax: [phone]

## 2016-11-03 ENCOUNTER — Ambulatory Visit: Payer: Medicaid Other | Attending: Internal Medicine | Admitting: Internal Medicine

## 2016-11-03 ENCOUNTER — Encounter: Payer: Self-pay | Admitting: Internal Medicine

## 2016-11-03 VITALS — BP 153/108 | HR 61 | Temp 98.0°F | Resp 16 | Wt 225.2 lb

## 2016-11-03 DIAGNOSIS — F17210 Nicotine dependence, cigarettes, uncomplicated: Secondary | ICD-10-CM | POA: Diagnosis not present

## 2016-11-03 DIAGNOSIS — Z23 Encounter for immunization: Secondary | ICD-10-CM | POA: Diagnosis not present

## 2016-11-03 DIAGNOSIS — K219 Gastro-esophageal reflux disease without esophagitis: Secondary | ICD-10-CM | POA: Diagnosis not present

## 2016-11-03 DIAGNOSIS — Z1211 Encounter for screening for malignant neoplasm of colon: Secondary | ICD-10-CM

## 2016-11-03 DIAGNOSIS — Z955 Presence of coronary angioplasty implant and graft: Secondary | ICD-10-CM | POA: Insufficient documentation

## 2016-11-03 DIAGNOSIS — Z7902 Long term (current) use of antithrombotics/antiplatelets: Secondary | ICD-10-CM | POA: Diagnosis not present

## 2016-11-03 DIAGNOSIS — F1010 Alcohol abuse, uncomplicated: Secondary | ICD-10-CM

## 2016-11-03 DIAGNOSIS — I252 Old myocardial infarction: Secondary | ICD-10-CM | POA: Insufficient documentation

## 2016-11-03 DIAGNOSIS — Z1321 Encounter for screening for nutritional disorder: Secondary | ICD-10-CM

## 2016-11-03 DIAGNOSIS — I739 Peripheral vascular disease, unspecified: Secondary | ICD-10-CM

## 2016-11-03 DIAGNOSIS — F1410 Cocaine abuse, uncomplicated: Secondary | ICD-10-CM | POA: Diagnosis not present

## 2016-11-03 DIAGNOSIS — R42 Dizziness and giddiness: Secondary | ICD-10-CM | POA: Diagnosis not present

## 2016-11-03 DIAGNOSIS — E785 Hyperlipidemia, unspecified: Secondary | ICD-10-CM | POA: Diagnosis not present

## 2016-11-03 DIAGNOSIS — Z7689 Persons encountering health services in other specified circumstances: Secondary | ICD-10-CM | POA: Insufficient documentation

## 2016-11-03 DIAGNOSIS — G4733 Obstructive sleep apnea (adult) (pediatric): Secondary | ICD-10-CM

## 2016-11-03 DIAGNOSIS — R21 Rash and other nonspecific skin eruption: Secondary | ICD-10-CM | POA: Insufficient documentation

## 2016-11-03 DIAGNOSIS — I2510 Atherosclerotic heart disease of native coronary artery without angina pectoris: Secondary | ICD-10-CM | POA: Insufficient documentation

## 2016-11-03 DIAGNOSIS — Z79899 Other long term (current) drug therapy: Secondary | ICD-10-CM | POA: Insufficient documentation

## 2016-11-03 DIAGNOSIS — I10 Essential (primary) hypertension: Secondary | ICD-10-CM | POA: Diagnosis not present

## 2016-11-03 DIAGNOSIS — F17200 Nicotine dependence, unspecified, uncomplicated: Secondary | ICD-10-CM

## 2016-11-03 DIAGNOSIS — R400 Somnolence: Secondary | ICD-10-CM

## 2016-11-03 DIAGNOSIS — Z125 Encounter for screening for malignant neoplasm of prostate: Secondary | ICD-10-CM | POA: Insufficient documentation

## 2016-11-03 DIAGNOSIS — Z8673 Personal history of transient ischemic attack (TIA), and cerebral infarction without residual deficits: Secondary | ICD-10-CM | POA: Diagnosis not present

## 2016-11-03 DIAGNOSIS — Z7982 Long term (current) use of aspirin: Secondary | ICD-10-CM | POA: Diagnosis not present

## 2016-11-03 DIAGNOSIS — I2121 ST elevation (STEMI) myocardial infarction involving left circumflex coronary artery: Secondary | ICD-10-CM

## 2016-11-03 DIAGNOSIS — Z1389 Encounter for screening for other disorder: Secondary | ICD-10-CM | POA: Diagnosis not present

## 2016-11-03 LAB — GLUCOSE, POCT (MANUAL RESULT ENTRY): POC GLUCOSE: 119 mg/dL — AB (ref 70–99)

## 2016-11-03 MED ORDER — KETOCONAZOLE 2 % EX CREA: 1.0000 "application " | TOPICAL_CREAM | Freq: Two times a day (BID) | CUTANEOUS | 1 refills | Status: DC

## 2016-11-03 MED FILL — KETOCONAZOLE 2% CREAM: 2 | 5 days supply | Qty: 15 | Fill #0

## 2016-11-03 NOTE — Progress Notes (Signed)
[name], is a 56 y.o. male  YKZ:[id number]  LTJ:[id number]  DOB - 1960/09/16  CC: No chief complaint on file.      HPI: [name] is a 56 y.o. male here today to establish medical care, last saw PA on 10/01/16.   PMhx obtained from DR McAlhany's noted yesterday; History of HTN, hyperlipidemia, tobacco abuse, former cocaine abuse, PAD and CAD here today for cardiac follow up. He initially presented with an inferolateral STEMI in March 2010 at which time his Circumflex was occluded and was opened with a balloon. He underwent emergent 3V CABG (LIMA to LAD, SVG to Circumflex, SVG to PDA) given his diffuse multivessel disease. In November 2010 he presented with a NSTEMI and had bare metal stenting of the totally occluded saphenous vein graft to the obtuse marginal artery. Cardiac cath October 2014 with 3 patent bypass grafts. Admitted July 2016 to [facility] with CVA. Echo 02/21/15 with normal LV function. Carotid dopplers July 2016 with mild bilateral carotid disease. Admitted to [facility] 07/07/16 with lateral STEMI secondary to acute occlusion of the SVG to the OM. A Synergy DES was placed in the body of the SVG to the OM. Normal LV function by LV gram. He had dyspnea with Brilinta and was discharged on ASA and Plavix. He was referred to DR. Arida for PV workup. Lower ext angiogram 09/23/16 with occluded left SFA which was treated with atherectomy, drug coated balloon angioplasty and stenting. Moderate disease in right SFA.   Pt c/o of intermittent right sided leg pains still, but bit better since trx of left SFA on 09/23/16. He asked me what is plan on the right leg, which shows mod disease.     Still smoking 1.5 ppd, >40 years.  Drink 3-4 (8oz) beers/night. Very remote cocaine. Pt denies doe currently, but did state he thinks he has osa, no formal testing thought. Wakes up at night gasping for breath, daytime somnolence.  Pt has rx for chantix in our pharmacy, trying to get financial aid for that.  Of note, he is ready and willing to stop smoking.  Also c/o of bilat rash behind his ears, pruritic in nature.  States he gets dizzy at times, denies palpitations/n/v/syncope. Of note, he states she does not drink a lot of water, just enough to take his meds.  Patient has No headache, No chest pain, No abdominal pain - No Nausea, No new weakness tingling or numbness, No Cough - SOB.  Denies orthopnea/swelling currently.      Review of Systems: Per hpi, o/w all systems reviewed and negative.      No Known Allergies Past Medical History:  Diagnosis Date  . Abnormality of gait   . Anxiety   . CAD (coronary artery disease)    a. 10/2008 Inferolateral STEMI: 3VD w/ occluded LCX (PTCA)-->CABG x 3 (LIMA->LAD, VG->OM, VG->PDA);  b. 06/2009 NSTEMI/PCI: VG->OM 100 (BMS);  c. 05/2013 native 3VD, 3/3 patent grafts. d. 06/2016: Inferior STEMI w/ occluded SVG-OM (DES placed). LIMA-LAD and SVG-PDA patent.  . Chronic bronchitis ([place])   . Chronic diastolic CHF (congestive heart failure) ([place])    a. 02/2015 Echo: EF 50-55%, distal septal, apical, inferobasal HK, mild LVH, mild MR, mildly dil LA.  [name] Cocaine use   . Colon polyps    a. 09/2015 colonscopy: multiple sessile polyps, path negative for high grade dysplasia.  . Depression   . Emphysema (subcutaneous) (surgical) resulting from a procedure 10/2008.   Bleb resected at time of 10/2008 CABG. Marketed  emphysema noted on surgical report.  . Esophagitis    a. 2017 EGD: esophagitis, duodenitis.  [name] ETOH abuse   . GERD (gastroesophageal reflux disease)   . Hemianopia, homonymous, right    "can't see out the right side of either eye since stroke in 2016/pt description 09/23/2016  . Hepatic steatosis   . Hiatal hernia   . Hyperlipidemia   . Hypertension   . Iron deficiency anemia 2010  . Morbid obesity ([place])   . Myocardial infarction "several"  . PAD (peripheral artery disease) ([place])   . Pancreatitis 06/2015  . Sleep apnea    "don't have a mask"  (09/23/2016)  . Stroke ([place]) 02/2015   a. 02/2015 Carotid U/S: 1-39% bilat ICA stenosis; "left me blind" (09/23/2016)  . Tobacco abuse    still smokes  . Tubular adenoma of colon    Current Outpatient Prescriptions on File Prior to Visit  Medication Sig Dispense Refill  . aspirin 81 MG chewable tablet Chew 1 tablet (81 mg total) by mouth daily.    [name] atorvastatin (LIPITOR) 80 MG tablet Take 1 tablet (80 mg total) by mouth daily at 6 PM. 30 tablet 11  . buPROPion (WELLBUTRIN XL) 150 MG 24 hr tablet Take 1 tablet (150 mg total) by mouth daily. 30 tablet 1  . clopidogrel (PLAVIX) 75 MG tablet Take 1 tablet (75 mg total) by mouth daily. 30 tablet 11  . isosorbide mononitrate (IMDUR) 30 MG 24 hr tablet Take 1 tablet (30 mg total) by mouth daily. 30 tablet 11  . metoprolol tartrate (LOPRESSOR) 25 MG tablet Take 1 tablet (25 mg total) by mouth 2 (two) times daily. 60 tablet 11  . nitroGLYCERIN (NITROSTAT) 0.4 MG SL tablet Place 1 tablet (0.4 mg total) under the tongue every 5 (five) minutes as needed for chest pain. 25 tablet 0  . pantoprazole (PROTONIX) 40 MG tablet Take 1 tablet (40 mg total) by mouth 2 (two) times daily before a meal. 60 tablet 5   No current facility-administered medications on file prior to visit.    Family History  Problem Relation Age of Onset  . Heart disease Mother   . Heart failure Mother   . Diabetes Mother   . Colon cancer Father     late 29s  . Prostate cancer Neg Hx    Social History   Social History  . Marital status: Divorced    Spouse name: N/A  . Number of children: 2  . Years of education: N/A   Occupational History  . not working , used to be a Chief Executive Officer     Social History Main Topics  . Smoking status: Current Every Day Smoker    Packs/day: 1.00    Years: 43.00    Types: Cigarettes  . Smokeless tobacco: Never Used  . Alcohol use 8.4 oz/week    14 Cans of beer per week     Comment: 09/23/2016 "couple beers/day"  . Drug use: No      Comment: 09/23/2016 "none in years"  . Sexual activity: Not Currently   Other Topics Concern  . Not on file   Social History Narrative   Lives w/ girlfriend     Objective:  There were no vitals filed for this visit.  There were no vitals filed for this visit.  BP Readings from Last 3 Encounters:  11/02/16 138/80  10/13/16 (!) 156/80  10/01/16 (!) 163/104    Physical Exam: Constitutional: Patient appears well-developed and well-nourished. No distress. AAOx3,  morbid obese, pleasant. Tough, leathery facial skin. HENT: Normocephalic, atraumatic, External right and left ear w/ dry, red, scaly rash noted behind bilat auricles.  Oropharynx is clear and moist.   bilat TMs clear.  Eyes: Conjunctivae and EOM are normal. PERRL, no scleral icterus. Neck: Normal ROM. Neck supple. No JVD.  CVS: RRR, S1/S2 +, no murmurs, no gallops, no carotid bruit.  Pulmonary: Effort and breath sounds normal, no stridor, rhonchi, wheezes, rales.  Abdominal: Soft. BS +, no distension, tenderness, rebound or guarding.  Musculoskeletal: Normal range of motion. No edema and no tenderness.  LE: bilat/ no c/c/e, pulses 2+ bilateral. Neuro: Alert.  muscle tone coordination wnl. No cranial nerve deficit grossly. Skin: Skin is warm and dry. Not diaphoretic. No erythema. No pallor. Psychiatric: Normal mood and affect. Behavior, judgment, thought content normal.  Lab Results  Component Value Date   WBC 7.0 09/15/2016   HGB 12.8 (L) 09/15/2016   HCT 41.2 09/15/2016   MCV 82.7 09/15/2016   PLT 221 09/15/2016   Lab Results  Component Value Date   CREATININE 0.86 09/15/2016   BUN 12 09/15/2016   NA 135 09/15/2016   K 4.8 09/15/2016   CL 101 09/15/2016   CO2 26 09/15/2016    Lab Results  Component Value Date   HGBA1C 5.8 (H) 07/07/2016   Lipid Panel     Component Value Date/Time   CHOL 159 07/08/2016 0532   TRIG 147 07/08/2016 0532   HDL 33 (L) 07/08/2016 0532   CHOLHDL 4.8 07/08/2016 0532   VLDL 29  07/08/2016 0532   LDLCALC 97 07/08/2016 0532        Depression screen PHQ 2/9 10/01/2016 05/31/2015 05/05/2013  Decreased Interest 3 0 3  Down, Depressed, Hopeless 3 0 -  PHQ - 2 Score 6 0 3  Altered sleeping 3 - -  Tired, decreased energy 3 - -  Change in appetite 0 - -  Feeling bad or failure about yourself  0 - -  Trouble concentrating 3 - -  Moving slowly or fidgety/restless 2 - -  Suicidal thoughts 0 - -  PHQ-9 Score 17 - -   09/13/15 colonoscopy bx path FINAL DIAGNOSIS Diagnosis 1. Surgical [P], duodenal biopsies - BENIGN SMALL BOWEL MUCOSA. - NO ACTIVE INFLAMMATION OR VILLOUS ATROPHY IDENTIFIED. 2. Surgical [P], gastric antrum and gastric body - MILD CHRONIC INACTIVE GASTRITIS. - THERE IS NO EVIDENCE OF HELICOBACTER PYLORI, DYSPLASIA OR MALIGNANCY. - SEE COMMENT. 3. Surgical [P], ascending, polyp (2) - TUBULAR ADENOMA(S). - HIGH GRADE DYSPLASIA IS NOT IDENTIFIED. 4. Surgical [P], hepatic flexure x 2, transverse x 2, polyp (4) - TUBULAR ADENOMA(S). - HIGH GRADE DYSPLASIA IS NOT IDENTIFIED. 5. Surgical [P], descending, polyp (4) - NO DIAGNOSIS. - SEE COMMENT. 6. Surgical [P], sigmoid x 2, rectal x 1, polyp (3) total - TUBULAR ADENOMA(S). - HIGH GRADE DYSPLASIA IS NOT IDENTIFIED. Microscopic Comment 2. Given the presence of inflammation, a Warthin-Starry stain was performed which is negative for the presence of Helicobacter pylori organisms. 5. The specimen container was received empty. Enid Cutter MD Pathologist, Electronic Signature (Case signed 09/19/2015)   Assessment and plan:   1. HYPERTENSION, BENIGN ESSENTIAL elevated today, was decently controlled at cards visit yesterday - will keep same rx., dw pt low salt diet, stop smoking! - drink more water.  4. CAD, hx of ST elevation myocardial infarction involving left circumflex coronary artery [facility]) He is s/p 3V CABG in March 2010 and has had bare metal stent to SVG  to Circumflex in November 2010 as well as  recent DES placed in the body of the SVG to OM in November 2017 in the setting of lateral STEMI. LV function is normal by LV gram 2017.  - Will continue ASA, Plavix, statin and beta blocker.   2. OSA (obstructive sleep apnea) suspected w/ no formal testing prior - no formal testing prior - sle1000 order, will see if insurance covers.  3. Gastroesophageal reflux disease, esophagitis presence not specified Currently on ppi, stop when able, gerd diet info provided.   4. PAD (peripheral artery disease) ([place]) - since trx of left SFA on 09/23/16. ,  right leg, which shows mod disease. - recd pt f/u w/ Dr  Fletcher Anon on recds.  5. Colon cancer screening Had cscope w/ Dr Hilarie Fredrickson 2/17, numerous polpys/adenomas noted, recd f/u colonoscopy in  1 year. - amb ref gi. placed  5. Polysubstance abuse (Alcohol abuse/tob, former cocaine abuse.) Again import. Of tob cessation due to his signif. Disease. - he is trying to obtain chantix through our pharmacy at low cost currently.  6. Dyslipidemia Continue statin  7. Prostate cancer screening chk psa  8. Encounter for vitamin deficiency screening chk vit d levels  9. tdap and pneumococcal 23v today.  10. Dizziness - dehydration? Encouraged more water intake. - chk bmp   11. Tinea rash behind bilat ears - trial ketoconazole cream.    Return in about 3 months (around 02/03/2017), or if symptoms worsen or fail to improve.  The patient was given clear instructions to go to ER or return to medical center if symptoms don't improve, worsen or new problems develop. The patient verbalized understanding. The patient was told to call to get lab results if they haven't heard anything in the next week.    This note has been created with Surveyor, quantity. Any transcriptional errors are unintentional.   Maren Reamer, MD, [place], [place]     11/03/2016, 2:09 PM

## 2016-11-03 NOTE — Patient Instructions (Addendum)
Cards/vascular doctor - Kathlyn Sacramento, MD   For your tobacco abuse: Strongly recommend that he stop smoking back and all other inhaled products right away Call 1-800-Quit-Now to get free nicotine replacement from the state of [place]    Pneumococcal Polysaccharide Vaccine: What You Need to Know 1. Why get vaccinated? Vaccination can protect older adults (and some children and younger adults) from pneumococcal disease. Pneumococcal disease is caused by bacteria that can spread from person to person through close contact. It can cause ear infections, and it can also lead to more serious infections of the:  Lungs (pneumonia),  Blood (bacteremia), and  Covering of the brain and spinal cord (meningitis). Meningitis can cause deafness and brain damage, and it can be fatal. Anyone can get pneumococcal disease, but children under 52 years of age, people with certain medical conditions, adults over 45 years of age, and cigarette smokers are at the highest risk. About 18,000 older adults die each year from pneumococcal disease in the [place]. Treatment of pneumococcal infections with penicillin and other drugs used to be more effective. But some strains of the disease have become resistant to these drugs. This makes prevention of the disease, through vaccination, even more important. 2. Pneumococcal polysaccharide vaccine (PPSV23) Pneumococcal polysaccharide vaccine (PPSV23) protects against 23 types of pneumococcal bacteria. It will not prevent all pneumococcal disease. PPSV23 is recommended for:  All adults 5 years of age and older,  Anyone 2 through 54 years of age with certain long-term health problems,  Anyone 2 through 54 years of age with a weakened immune system,  Adults 35 through 54 years of age who smoke cigarettes or have asthma. Most people need only one dose of PPSV. A second dose is recommended for certain high-risk groups. People 31 and older should get a dose even if they  have gotten one or more doses of the vaccine before they turned 65. Your healthcare provider can give you more information about these recommendations. Most healthy adults develop protection within 2 to 3 weeks of getting the shot. 3. Some people should not get this vaccine  Anyone who has had a life-threatening allergic reaction to PPSV should not get another dose.  Anyone who has a severe allergy to any component of PPSV should not receive it. Tell your provider if you have any severe allergies.  Anyone who is moderately or severely ill when the shot is scheduled may be asked to wait until they recover before getting the vaccine. Someone with a mild illness can usually be vaccinated.  Children less than 67 years of age should not receive this vaccine.  There is no evidence that PPSV is harmful to either a pregnant woman or to her fetus. However, as a precaution, women who need the vaccine should be vaccinated before becoming pregnant, if possible. 4. Risks of a vaccine reaction With any medicine, including vaccines, there is a chance of side effects. These are usually mild and go away on their own, but serious reactions are also possible. About half of people who get PPSV have mild side effects, such as redness or pain where the shot is given, which go away within about two days. Less than 1 out of 100 people develop a fever, muscle aches, or more severe local reactions. Problems that could happen after any vaccine:   People sometimes faint after a medical procedure, including vaccination. Sitting or lying down for about 15 minutes can help prevent fainting, and injuries caused by a fall. Tell your  doctor if you feel dizzy, or have vision changes or ringing in the ears.  Some people get severe pain in the shoulder and have difficulty moving the arm where a shot was given. This happens very rarely.  Any medication can cause a severe allergic reaction. Such reactions from a vaccine are very  rare, estimated at about 1 in a million doses, and would happen within a few minutes to a few hours after the vaccination. As with any medicine, there is a very remote chance of a vaccine causing a serious injury or death. The safety of vaccines is always being monitored. For more information, visit: http://www.aguilar.org/ 5. What if there is a serious reaction? What should I look for?  Look for anything that concerns you, such as signs of a severe allergic reaction, very high fever, or unusual behavior. Signs of a severe allergic reaction can include hives, swelling of the face and throat, difficulty breathing, a fast heartbeat, dizziness, and weakness. These would usually start a few minutes to a few hours after the vaccination. What should I do?  If you think it is a severe allergic reaction or other emergency that can't wait, call 9-1-1 or get to the nearest hospital. Otherwise, call your doctor. Afterward, the reaction should be reported to the Vaccine Adverse Event Reporting System (VAERS). Your doctor might file this report, or you can do it yourself through the VAERS web site at www.vaers.SamedayNews.es, or by calling 636-155-1190. VAERS does not give medical advice.  6. How can I learn more?  Ask your doctor. He or she can give you the vaccine package insert or suggest other sources of information.  Call your local or state health department.  Contact the Centers for Disease Control and Prevention (CDC):  Call (757)711-8317 (1-800-CDC-INFO) or  Visit CDC's website at http://hunter.com/ CDC Pneumococcal Polysaccharide Vaccine VIS (12/01/13) This information is not intended to replace advice given to you by your health care provider. Make sure you discuss any questions you have with your health care provider. Document Released: 05/24/2006 Document Revised: 04/16/2016 Document Reviewed: 04/16/2016 Elsevier Interactive Patient Education  2017 [place] (Tetanus and  Diphtheria): What You Need to Know 1. Why get vaccinated? Tetanus  and diphtheria are very serious diseases. They are rare in the [place] today, but people who do become infected often have severe complications. Td vaccine is used to protect adolescents and adults from both of these diseases. Both tetanus and diphtheria are infections caused by bacteria. Diphtheria spreads from person to person through coughing or sneezing. Tetanus-causing bacteria enter the body through cuts, scratches, or wounds. TETANUS (lockjaw) causes painful muscle tightening and stiffness, usually all over the body.  It can lead to tightening of muscles in the head and neck so you can't open your mouth, swallow, or sometimes even breathe. Tetanus kills about 1 out of every 10 people who are infected even after receiving the best medical care. DIPHTHERIA can cause a thick coating to form in the back of the throat.  It can lead to breathing problems, paralysis, heart failure, and death. Before vaccines, as many as 200,000 cases of diphtheria and hundreds of cases of tetanus were reported in the [place] each year. Since vaccination began, reports of cases for both diseases have dropped by about 99%. 2. Td vaccine Td vaccine can protect adolescents and adults from tetanus and diphtheria. Td is usually given as a booster dose every 10 years but it can also be given earlier after  a severe and dirty wound or burn. Another vaccine, called Tdap, which protects against pertussis in addition to tetanus and diphtheria, is sometimes recommended instead of Td vaccine. Your doctor or the person giving you the vaccine can give you more information. Td may safely be given at the same time as other vaccines. 3. Some people should not get this vaccine  A person who has ever had a life-threatening allergic reaction after a previous dose of any tetanus or diphtheria containing vaccine, OR has a severe allergy to any part of this  vaccine, should not get Td vaccine. Tell the person giving the vaccine about any severe allergies.  Talk to your doctor if you:  had severe pain or swelling after any vaccine containing diphtheria or tetanus,  ever had a condition called Guillain Barre Syndrome (GBS),  aren't feeling well on the day the shot is scheduled. 4. What are the risks from Td vaccine? With any medicine, including vaccines, there is a chance of side effects. These are usually mild and go away on their own. Serious reactions are also possible but are rare. Most people who get Td vaccine do not have any problems with it. Mild problems following Td vaccine:  (Did not interfere with activities)  Pain where the shot was given (about 8 people in 10)  Redness or swelling where the shot was given (about 1 person in 4)  Mild fever (rare)  Headache (about 1 person in 4)  Tiredness (about 1 person in 4) Moderate problems following Td vaccine:  (Interfered with activities, but did not require medical attention)  Fever over 102F (rare) Severe problems following Td vaccine:  (Unable to perform usual activities; required medical attention)  Swelling, severe pain, bleeding and/or redness in the arm where the shot was given (rare). Problems that could happen after any vaccine:   People sometimes faint after a medical procedure, including vaccination. Sitting or lying down for about 15 minutes can help prevent fainting, and injuries caused by a fall. Tell your doctor if you feel dizzy, or have vision changes or ringing in the ears.  Some people get severe pain in the shoulder and have difficulty moving the arm where a shot was given. This happens very rarely.  Any medication can cause a severe allergic reaction. Such reactions from a vaccine are very rare, estimated at fewer than 1 in a million doses, and would happen within a few minutes to a few hours after the vaccination. As with any medicine, there is a very remote  chance of a vaccine causing a serious injury or death. The safety of vaccines is always being monitored. For more information, visit: http://www.aguilar.org/ 5. What if there is a serious reaction? What should I look for?  Look for anything that concerns you, such as signs of a severe allergic reaction, very high fever, or unusual behavior. Signs of a severe allergic reaction can include hives, swelling of the face and throat, difficulty breathing, a fast heartbeat, dizziness, and weakness. These would usually start a few minutes to a few hours after the vaccination. What should I do?   If you think it is a severe allergic reaction or other emergency that can't wait, call 9-1-1 or get the person to the nearest hospital. Otherwise, call your doctor.  Afterward, the reaction should be reported to the Vaccine Adverse Event Reporting System (VAERS). Your doctor might file this report, or you can do it yourself through the VAERS web site at www.vaers.SamedayNews.es, or by calling  873-670-5232.  VAERS does not give medical advice. 6. The National Vaccine Injury Compensation Program The [employer] Vaccine Injury Compensation Program (VICP) is a federal program that was created to compensate people who may have been injured by certain vaccines. Persons who believe they may have been injured by a vaccine can learn about the program and about filing a claim by calling 484-243-3097 or visiting the [place] website at GoldCloset.com.ee. There is a time limit to file a claim for compensation. 7. How can I learn more?  Ask your doctor. He or she can give you the vaccine package insert or suggest other sources of information.  Call your local or state health department.  Contact the Centers for Disease Control and Prevention (CDC):  Call 785-212-3819 (1-800-CDC-INFO)  Visit CDC's website at http://hunter.com/ CDC Td Vaccine VIS (11/19/15) This information is not intended to replace advice  given to you by your health care provider. Make sure you discuss any questions you have with your health care provider. Document Released: 05/24/2006 Document Revised: 04/16/2016 Document Reviewed: 04/16/2016 Elsevier Interactive Patient Education  2017 [employer].   -   Steps to Quit Smoking Smoking tobacco can be bad for your health. It can also affect almost every organ in your body. Smoking puts you and people around you at risk for many serious long-lasting (chronic) diseases. Quitting smoking is hard, but it is one of the best things that you can do for your health. It is never too late to quit. What are the benefits of quitting smoking? When you quit smoking, you lower your risk for getting serious diseases and conditions. They can include:  Lung cancer or lung disease.  Heart disease.  Stroke.  Heart attack.  Not being able to have children (infertility).  Weak bones (osteoporosis) and broken bones (fractures). If you have coughing, wheezing, and shortness of breath, those symptoms may get better when you quit. You may also get sick less often. If you are pregnant, quitting smoking can help to lower your chances of having a baby of low birth weight. What can I do to help me quit smoking? Talk with your doctor about what can help you quit smoking. Some things you can do (strategies) include:  Quitting smoking totally, instead of slowly cutting back how much you smoke over a period of time.  Going to in-person counseling. You are more likely to quit if you go to many counseling sessions.  Using resources and support systems, such as:  Online chats with a Social worker.  Phone quitlines.  Printed Furniture conservator/restorer.  Support groups or group counseling.  Text messaging programs.  Mobile phone apps or applications.  Taking medicines. Some of these medicines may have nicotine in them. If you are pregnant or breastfeeding, do not take any medicines to quit smoking  unless your doctor says it is okay. Talk with your doctor about counseling or other things that can help you. Talk with your doctor about using more than one strategy at the same time, such as taking medicines while you are also going to in-person counseling. This can help make quitting easier. What things can I do to make it easier to quit? Quitting smoking might feel very hard at first, but there is a lot that you can do to make it easier. Take these steps:  Talk to your family and friends. Ask them to support and encourage you.  Call phone quitlines, reach out to support groups, or work with a Social worker.  Ask people who  smoke to not smoke around you.  Avoid places that make you want (trigger) to smoke, such as:  Bars.  Parties.  Smoke-break areas at work.  Spend time with people who do not smoke.  Lower the stress in your life. Stress can make you want to smoke. Try these things to help your stress:  Getting regular exercise.  Deep-breathing exercises.  Yoga.  Meditating.  Doing a body scan. To do this, close your eyes, focus on one area of your body at a time from head to toe, and notice which parts of your body are tense. Try to relax the muscles in those areas.  Download or buy apps on your mobile phone or tablet that can help you stick to your quit plan. There are many free apps, such as QuitGuide from the [employer] Office manager for Disease Control and Prevention). You can find more support from smokefree.gov and other websites. This information is not intended to replace advice given to you by your health care provider. Make sure you discuss any questions you have with your health care provider. Document Released: 05/23/2009 Document Revised: 03/24/2016 Document Reviewed: 12/11/2014 Elsevier Interactive Patient Education  2017 [place].  -  Low-Sodium Eating Plan Sodium, which is an element that makes up salt, helps you maintain a healthy balance of fluids in your body.  Too much sodium can increase your blood pressure and cause fluid and waste to be held in your body. Your health care provider or dietitian may recommend following this plan if you have high blood pressure (hypertension), kidney disease, liver disease, or heart failure. Eating less sodium can help lower your blood pressure, reduce swelling, and protect your heart, liver, and kidneys. What are tips for following this plan? General guidelines   Most people on this plan should limit their sodium intake to 1,500-2,000 mg (milligrams) of sodium each day. Reading food labels   The Nutrition Facts label lists the amount of sodium in one serving of the food. If you eat more than one serving, you must multiply the listed amount of sodium by the number of servings.  Choose foods with less than 140 mg of sodium per serving.  Avoid foods with 300 mg of sodium or more per serving. Shopping   Look for lower-sodium products, often labeled as "low-sodium" or "no salt added."  Always check the sodium content even if foods are labeled as "unsalted" or "no salt added".  Buy fresh foods.  Avoid canned foods and premade or frozen meals.  Avoid canned, cured, or processed meats  Buy breads that have less than 80 mg of sodium per slice. Cooking   Eat more home-cooked food and less restaurant, buffet, and fast food.  Avoid adding salt when cooking. Use salt-free seasonings or herbs instead of table salt or sea salt. Check with your health care provider or pharmacist before using salt substitutes.  Cook with plant-based oils, such as canola, sunflower, or olive oil. Meal planning   When eating at a restaurant, ask that your food be prepared with less salt or no salt, if possible.  Avoid foods that contain MSG (monosodium glutamate). MSG is sometimes added to [place] food, bouillon, and some canned foods. What foods are recommended? The items listed may not be a complete list. Talk with your dietitian  about what dietary choices are best for you. Grains  Low-sodium cereals, including oats, puffed wheat and rice, and shredded wheat. Low-sodium crackers. Unsalted rice. Unsalted pasta. Low-sodium bread. Whole-grain breads and whole-grain  pasta. Vegetables  Fresh or frozen vegetables. "No salt added" canned vegetables. "No salt added" tomato sauce and paste. Low-sodium or reduced-sodium tomato and vegetable juice. Fruits  Fresh, frozen, or canned fruit. Fruit juice. Meats and other protein foods  Fresh or frozen (no salt added) meat, poultry, seafood, and fish. Low-sodium canned tuna and salmon. Unsalted nuts. Dried peas, beans, and lentils without added salt. Unsalted canned beans. Eggs. Unsalted nut butters. Dairy  Milk. Soy milk. Cheese that is naturally low in sodium, such as ricotta cheese, fresh mozzarella, or Swiss cheese Low-sodium or reduced-sodium cheese. Cream cheese. Yogurt. Fats and oils  Unsalted butter. Unsalted margarine with no trans fat. Vegetable oils such as canola or olive oils. Seasonings and other foods  Fresh and dried herbs and spices. Salt-free seasonings. Low-sodium mustard and ketchup. Sodium-free salad dressing. Sodium-free light mayonnaise. Fresh or refrigerated horseradish. Lemon juice. Vinegar. Homemade, reduced-sodium, or low-sodium soups. Unsalted popcorn and pretzels. Low-salt or salt-free chips. What foods are not recommended? The items listed may not be a complete list. Talk with your dietitian about what dietary choices are best for you. Grains  Instant hot cereals. Bread stuffing, pancake, and biscuit mixes. Croutons. Seasoned rice or pasta mixes. Noodle soup cups. Boxed or frozen macaroni and cheese. Regular salted crackers. Self-rising flour. Vegetables  Sauerkraut, pickled vegetables, and relishes. Olives. [place] fries. Onion rings. Regular canned vegetables (not low-sodium or reduced-sodium). Regular canned tomato sauce and paste (not low-sodium or  reduced-sodium). Regular tomato and vegetable juice (not low-sodium or reduced-sodium). Frozen vegetables in sauces. Meats and other protein foods  Meat or fish that is salted, canned, smoked, spiced, or pickled. Bacon, ham, sausage, hotdogs, corned beef, chipped beef, packaged lunch meats, salt pork, jerky, pickled herring, anchovies, regular canned tuna, sardines, salted nuts. Dairy  Processed cheese and cheese spreads. Cheese curds. Blue cheese. Feta cheese. String cheese. Regular cottage cheese. Buttermilk. Canned milk. Fats and oils  Salted butter. Regular margarine. Ghee. Bacon fat. Seasonings and other foods  Onion salt, garlic salt, seasoned salt, table salt, and sea salt. Canned and packaged gravies. Worcestershire sauce. Tartar sauce. Barbecue sauce. Teriyaki sauce. Soy sauce, including reduced-sodium. Steak sauce. Fish sauce. Oyster sauce. Cocktail sauce. Horseradish that you find on the shelf. Regular ketchup and mustard. Meat flavorings and tenderizers. Bouillon cubes. Hot sauce and Tabasco sauce. Premade or packaged marinades. Premade or packaged taco seasonings. Relishes. Regular salad dressings. Salsa. Potato and tortilla chips. Corn chips and puffs. Salted popcorn and pretzels. Canned or dried soups. Pizza. Frozen entrees and pot pies. Summary  Eating less sodium can help lower your blood pressure, reduce swelling, and protect your heart, liver, and kidneys.  Most people on this plan should limit their sodium intake to 1,500-2,000 mg (milligrams) of sodium each day.  Canned, boxed, and frozen foods are high in sodium. Restaurant foods, fast foods, and pizza are also very high in sodium. You also get sodium by adding salt to food.  Try to cook at home, eat more fresh fruits and vegetables, and eat less fast food, canned, processed, or prepared foods. This information is not intended to replace advice given to you by your health care provider. Make sure you discuss any questions you  have with your health care provider. Document Released: 01/16/2002 Document Revised: 07/20/2016 Document Reviewed: 07/20/2016 Elsevier Interactive Patient Education  2017 [place] for Gastroesophageal Reflux Disease, Adult When you have gastroesophageal reflux disease (GERD), the foods you eat and your eating habits are  very important. Choosing the right foods can help ease your discomfort. What guidelines do I need to follow?  Choose fruits, vegetables, whole grains, and low-fat dairy products.  Choose low-fat meat, fish, and poultry.  Limit fats such as oils, salad dressings, butter, nuts, and avocado.  Keep a food diary. This helps you identify foods that cause symptoms.  Avoid foods that cause symptoms. These may be different for everyone.  Eat small meals often instead of 3 large meals a day.  Eat your meals slowly, in a place where you are relaxed.  Limit fried foods.  Cook foods using methods other than frying.  Avoid drinking alcohol.  Avoid drinking large amounts of liquids with your meals.  Avoid bending over or lying down until 2-3 hours after eating. What foods are not recommended? These are some foods and drinks that may make your symptoms worse: Vegetables  Tomatoes. Tomato juice. Tomato and spaghetti sauce. Chili peppers. Onion and garlic. Horseradish. Fruits  Oranges, grapefruit, and lemon (fruit and juice). Meats  High-fat meats, fish, and poultry. This includes hot dogs, ribs, ham, sausage, salami, and bacon. Dairy  Whole milk and chocolate milk. Sour cream. Cream. Butter. Ice cream. Cream cheese. Drinks  Coffee and tea. Bubbly (carbonated) drinks or energy drinks. Condiments  Hot sauce. Barbecue sauce. Sweets/Desserts  Chocolate and cocoa. Donuts. Peppermint and spearmint. Fats and Oils  High-fat foods. This includes [place] fries and potato chips. Other  Vinegar. Strong spices. This includes black pepper, white pepper, red  pepper, cayenne, curry powder, cloves, ginger, and chili powder. The items listed above may not be a complete list of foods and drinks to avoid. Contact your dietitian for more information.  This information is not intended to replace advice given to you by your health care provider. Make sure you discuss any questions you have with your health care provider. Document Released: 01/26/2012 Document Revised: 01/02/2016 Document Reviewed: 05/31/2013 Elsevier Interactive Patient Education  2017 [employer].

## 2016-11-04 ENCOUNTER — Other Ambulatory Visit: Payer: Self-pay | Admitting: Internal Medicine

## 2016-11-04 LAB — BASIC METABOLIC PANEL
BUN/Creatinine Ratio: 16 (ref 9–20)
BUN: 14 mg/dL (ref 6–24)
CALCIUM: 8.9 mg/dL (ref 8.7–10.2)
CHLORIDE: 98 mmol/L (ref 96–106)
CO2: 26 mmol/L (ref 18–29)
CREATININE: 0.89 mg/dL (ref 0.76–1.27)
GFR calc Af Amer: 112 mL/min/{1.73_m2} (ref >59)
GFR calc non Af Amer: 97 mL/min/{1.73_m2} (ref >59)
GLUCOSE: 97 mg/dL (ref 65–99)
Potassium: 4.3 mmol/L (ref 3.5–5.2)
Sodium: 139 mmol/L (ref 134–144)

## 2016-11-04 LAB — PSA: Prostate Specific Ag, Serum: 0.4 ng/mL (ref 0.0–4.0)

## 2016-11-04 LAB — VITAMIN D 25 HYDROXY (VIT D DEFICIENCY, FRACTURES): VIT D 25 HYDROXY: 10.8 ng/mL — AB (ref 30.0–100.0)

## 2016-11-04 MED ORDER — VITAMIN D (ERGOCALCIFEROL) 1.25 MG (50000 UNIT) PO CAPS: 50000.0000 [IU] | ORAL_CAPSULE | ORAL | 0 refills | Status: DC

## 2016-11-05 ENCOUNTER — Encounter (HOSPITAL_COMMUNITY): Payer: Self-pay

## 2016-11-05 ENCOUNTER — Inpatient Hospital Stay (HOSPITAL_COMMUNITY): Admission: RE | Admit: 2016-11-05 | Payer: Self-pay | Source: Ambulatory Visit

## 2016-11-05 ENCOUNTER — Telehealth: Payer: Self-pay

## 2016-11-05 ENCOUNTER — Other Ambulatory Visit: Payer: Self-pay | Admitting: *Deleted

## 2016-11-05 MED ORDER — VARENICLINE TARTRATE 0.5 MG X 11 & 1 MG X 42 PO MISC: ORAL | 0 refills | Status: DC

## 2016-11-05 NOTE — Telephone Encounter (Signed)
Contacted pt to go over lab results pt is aware of lab results and doesn't have any questions or concerns.  

## 2016-11-05 NOTE — Telephone Encounter (Signed)
PRINTED FOR PASS PROGRAM 

## 2016-11-09 MED FILL — VIT D2 1.25 MG (50,000 UNIT: 1.25 MG | 84 days supply | Qty: 12 | Fill #0

## 2016-11-10 ENCOUNTER — Other Ambulatory Visit: Payer: Self-pay | Admitting: Internal Medicine

## 2016-11-10 ENCOUNTER — Ambulatory Visit (HOSPITAL_COMMUNITY)
Admission: RE | Admit: 2016-11-10 | Discharge: 2016-11-10 | Disposition: A | Discharge status: Home / Self Care | Payer: Medicaid Other | Source: Ambulatory Visit | Attending: Cardiovascular Disease | Admitting: Cardiovascular Disease

## 2016-11-10 DIAGNOSIS — I739 Peripheral vascular disease, unspecified: Secondary | ICD-10-CM

## 2016-11-10 MED FILL — METOPROLOL TARTRATE 25 MG T: 25 | 30 days supply | Qty: 60 | Fill #0

## 2016-11-10 MED FILL — ?BUPROPION HCL XL 150 MG TA: 150 | 30 days supply | Qty: 30 | Fill #1

## 2016-11-10 MED FILL — ?ISOSORBIDE MN ER 30 MG TAB: 30 | 30 days supply | Qty: 30 | Fill #1

## 2016-11-10 MED FILL — ?PANTOPRAZOLE SOD DR 40MG: 40 MG | 30 days supply | Qty: 60 | Fill #1

## 2016-11-10 MED FILL — ATORVASTATIN 80 MG TABLET: 80 | 30 days supply | Qty: 30 | Fill #1

## 2016-11-10 MED FILL — CLOPIDOGREL 75 MG TABLET: 75 | 30 days supply | Qty: 30 | Fill #0

## 2016-11-26 ENCOUNTER — Other Ambulatory Visit: Payer: Self-pay | Admitting: *Deleted

## 2016-11-26 MED ORDER — VARENICLINE TARTRATE 1 MG PO TABS: 1.0000 mg | ORAL_TABLET | Freq: Two times a day (BID) | ORAL | 3 refills | Status: DC

## 2016-11-26 NOTE — Telephone Encounter (Signed)
PRINTED FOR PASS PROGRAM 

## 2016-12-02 ENCOUNTER — Telehealth: Payer: Self-pay | Admitting: *Deleted

## 2016-12-02 DIAGNOSIS — K863 Pseudocyst of pancreas: Secondary | ICD-10-CM

## 2016-12-02 DIAGNOSIS — Z8719 Personal history of other diseases of the digestive system: Secondary | ICD-10-CM

## 2016-12-02 NOTE — Telephone Encounter (Signed)
I have left a voicemail for patient to call back. 

## 2016-12-02 NOTE — Telephone Encounter (Signed)
-----   Message from Larina Bras, [place] sent at 12/16/2015  2:42 PM EDT ----- Needs repeat CT scan abdomen only around 12/13/16. See 12/14/15 ct abdomen results. Hx pancreatitis, postinflammatory pseudocyst

## 2016-12-04 NOTE — Telephone Encounter (Signed)
I have spoken to patient to advise that he is due for ct scan of abdomen with pancreatic protocol due to his history of pancreatitis and pseudocyst. He is scheduled for CT at [facility] on Tuesday, 12/22/16 @ 3pm with 2:30 pm arrival. Nothing to eat or drink 2 hours prior. He is also scheduled for office visit on 01/20/17 @ 2:00 pm with Dr Hilarie Fredrickson to follow up for pancreatitis, Hx IDA and also to discuss colonoscopy (as requested by his PCP, Dr Janne Napoleon). Patient verbalizes understanding and has read back times/dates/prep/locations for each of these appointments.

## 2016-12-14 ENCOUNTER — Other Ambulatory Visit: Payer: Self-pay | Admitting: Physician Assistant

## 2016-12-14 MED FILL — PANTOPRAZOLE SOD DR 40 MG T: 40 | 30 days supply | Qty: 60 | Fill #2

## 2016-12-14 MED FILL — METOPROLOL TARTRATE 25 MG T: 25 | 30 days supply | Qty: 60 | Fill #1

## 2016-12-14 MED FILL — CLOPIDOGREL 75 MG TABLET: 75 | 30 days supply | Qty: 30 | Fill #1

## 2016-12-14 MED FILL — ?ISOSORBIDE MN ER 30 MG TAB: 30 | 30 days supply | Qty: 30 | Fill #2

## 2016-12-14 MED FILL — ATORVASTATIN 80 MG TABLET: 80 | 30 days supply | Qty: 30 | Fill #2

## 2016-12-15 MED FILL — ?BUPROPION HCL XL 150 MG TA: 150 | 30 days supply | Qty: 30 | Fill #0

## 2016-12-22 ENCOUNTER — Ambulatory Visit (INDEPENDENT_AMBULATORY_CARE_PROVIDER_SITE_OTHER)
Admission: RE | Admit: 2016-12-22 | Discharge: 2016-12-22 | Disposition: A | Discharge status: Home / Self Care | Payer: Self-pay | Source: Ambulatory Visit | Attending: Internal Medicine | Admitting: Internal Medicine

## 2016-12-22 DIAGNOSIS — Z8719 Personal history of other diseases of the digestive system: Secondary | ICD-10-CM

## 2016-12-22 DIAGNOSIS — K863 Pseudocyst of pancreas: Secondary | ICD-10-CM

## 2016-12-22 MED ORDER — IOPAMIDOL (ISOVUE-300) INJECTION 61%
100.0000 mL | Freq: Once | INTRAVENOUS | Status: AC | PRN
  Administered 2016-12-22: 100 mL via INTRAVENOUS

## 2016-12-23 MED FILL — $CHANTIX STARTING MONTH BOX: 0.5 MG X 11 | 28 days supply | Qty: 53 | Fill #0

## 2017-01-12 ENCOUNTER — Other Ambulatory Visit: Payer: Self-pay | Admitting: Internal Medicine

## 2017-01-12 ENCOUNTER — Ambulatory Visit (HOSPITAL_BASED_OUTPATIENT_CLINIC_OR_DEPARTMENT_OTHER): Payer: Medicaid Other | Attending: Internal Medicine | Admitting: Internal Medicine

## 2017-01-12 VITALS — Ht 71.0 in | Wt 220.0 lb

## 2017-01-12 DIAGNOSIS — G4733 Obstructive sleep apnea (adult) (pediatric): Secondary | ICD-10-CM

## 2017-01-12 DIAGNOSIS — R400 Somnolence: Secondary | ICD-10-CM

## 2017-01-12 DIAGNOSIS — G4710 Hypersomnia, unspecified: Secondary | ICD-10-CM | POA: Insufficient documentation

## 2017-01-12 MED FILL — ?PANTOPRAZOLE SOD DR 40MG: 40 MG | 30 days supply | Qty: 60 | Fill #3

## 2017-01-12 MED FILL — ISOSORBIDE MN ER 30 MG TAB: 30 | 30 days supply | Qty: 30 | Fill #3

## 2017-01-12 MED FILL — ?BUPROPION HCL XL 150 MG TA: 150 | 30 days supply | Qty: 30 | Fill #1

## 2017-01-12 MED FILL — ?METOPROLOL 25 MG TABLET: 25 | 30 days supply | Qty: 60 | Fill #2

## 2017-01-12 MED FILL — ?CLOPIDOGREL 75 MG TABLET: 75 | 30 days supply | Qty: 30 | Fill #2

## 2017-01-12 MED FILL — ATORVASTATIN 80 MG TABLET: 80 | 30 days supply | Qty: 30 | Fill #3

## 2017-01-20 ENCOUNTER — Ambulatory Visit: Payer: Medicaid Other | Admitting: Internal Medicine

## 2017-01-24 DIAGNOSIS — G4733 Obstructive sleep apnea (adult) (pediatric): Secondary | ICD-10-CM | POA: Diagnosis not present

## 2017-01-24 NOTE — Procedures (Signed)
   Patient Name: [name], [name] Date: 01/12/2017 Gender: Male D.O.B: [birth year] Age (years): 53 Referring Provider: Maren Reamer Height (inches): 71 Interpreting Physician: Baird Lyons MD, ABSM Weight (lbs): 220 RPSGT: Laren Everts BMI: 31 MRN: [id number] Neck Size: 21.00 CLINICAL INFORMATION Sleep Study Type: NPSG  Indication for sleep study: Excessive Daytime Sleepiness, Fatigue, Hypertension, Obesity, OSA, Snoring, Witnessed Apneas  Epworth Sleepiness Score: 9  SLEEP STUDY TECHNIQUE As per the AASM Manual for the Scoring of Sleep and Associated Events v2.3 (April 2016) with a hypopnea requiring 4% desaturations.  The channels recorded and monitored were frontal, central and occipital EEG, electrooculogram (EOG), submentalis EMG (chin), nasal and oral airflow, thoracic and abdominal wall motion, anterior tibialis EMG, snore microphone, electrocardiogram, and pulse oximetry.  MEDICATIONS Medications self-administered by patient taken the night of the study : none reported  SLEEP ARCHITECTURE The study was initiated at 10:31:08 PM and ended at 4:47:51 AM.  Sleep onset time was 51.3 minutes and the sleep efficiency was 45.4%. The total sleep time was 171.0 minutes.  Stage REM latency was 138.0 minutes.  The patient spent 43.86% of the night in stage N1 sleep, 41.52% in stage N2 sleep, 0.00% in stage N3 and 14.62% in REM.  Alpha intrusion was absent.  Supine sleep was 5.26%.  RESPIRATORY PARAMETERS The overall apnea/hypopnea index (AHI) was 86.7 per hour. There were 218 total apneas, including 108 obstructive, 44 central and 66 mixed apneas. There were 29 hypopneas and 2 RERAs.  The AHI during Stage REM sleep was 60.0 per hour.  AHI while supine was 106.7 per hour.  The mean oxygen saturation was 88.86%. The minimum SpO2 during sleep was 68.00%.  Moderate snoring was noted during this study.  CARDIAC DATA The 2 lead EKG demonstrated sinus rhythm.  The mean heart rate was 62.85 beats per minute. Other EKG findings include: PVCs.  LEG MOVEMENT DATA The total PLMS were 0 with a resulting PLMS index of 0.00. Associated arousal with leg movement index was 0.0 .  IMPRESSIONS - Severe obstructive sleep apnea occurred during this study (AHI = 86.7/h). - Patient could not sustain sleep sufficiently to meet protocol requirements for split CPAP titration. - Mild central sleep apnea occurred during this study (CAI = 15.4/h). - Severe oxygen desaturation was noted during this study (Min O2 = 68.00%). - The patient snored with Moderate snoring volume. - EKG findings include PVCs. - Clinically significant periodic limb movements did not occur during sleep. No significant associated arousals.  DIAGNOSIS - Obstructive Sleep Apnea (327.23 [G47.33 ICD-10]) - Central Sleep Apnea (327.27 [G47.37 ICD-10]) - Nocturnal Hypoxemia (327.26 [G47.36 ICD-10])  RECOMMENDATIONS - CPAP titration to determine optimal pressure required to alleviate sleep disordered breathing. - BiPAP or ASV titration may be required to eliminate central sleep apnea (Usually clinically unnecessary). - Positional therapy avoiding supine position during sleep. - Avoid alcohol, sedatives and other CNS depressants that may worsen sleep apnea and disrupt normal sleep architecture. - Sleep hygiene should be reviewed to assess factors that may improve sleep quality. - Weight management and regular exercise should be initiated or continued if appropriate.  Deneise Lever Diplomate, American Board of Sleep Medicine  ELECTRONICALLY SIGNED ON:  01/24/2017, 10:21 AM [place] PH: [phone]   FX: [phone] [place]

## 2017-01-29 ENCOUNTER — Telehealth: Payer: Self-pay | Admitting: Internal Medicine

## 2017-01-29 NOTE — Telephone Encounter (Signed)
Pt. Called requesting his sleep study results. Please f/u with pt.

## 2017-01-30 ENCOUNTER — Telehealth: Payer: Self-pay | Admitting: Internal Medicine

## 2017-01-30 DIAGNOSIS — G4733 Obstructive sleep apnea (adult) (pediatric): Secondary | ICD-10-CM

## 2017-01-30 NOTE — Telephone Encounter (Signed)
PC placed to pt today to discuss sleep study results sent to my in box.  Study completed 01/12/2017 and reviewed OSA, central sleep apnea and hypoxia during sleep.  Pt needs to have titration study done. Pt states his Medicaid runs out the end of July so will need to get 2nd part of sleep study done before then. I will order and have scheduling make f/u appt with me. Pt told to avoid laying flat on back. Better to sleep with head elevated and on side.  Avoid ETOH beverages at bedtime and sedatives.

## 2017-02-01 NOTE — Progress Notes (Signed)
Patient sleep study results

## 2017-02-01 NOTE — Telephone Encounter (Signed)
Pt appt scheduled for 02/26/17 and pt put on jump list to offer appt sooner if one becomes available.

## 2017-02-01 NOTE — Telephone Encounter (Signed)
Pt will need an appointment with Dr. Wynetta Emery for re-establish

## 2017-02-02 ENCOUNTER — Other Ambulatory Visit: Payer: Self-pay | Admitting: Internal Medicine

## 2017-02-03 NOTE — Telephone Encounter (Signed)
Medical Assistant left message on patient's home and cell voicemail. Voicemail states to give a call back to [place] with [facility] at [phone].  MA informed patient of sleep study being positive for OSA. Patient is aware of a CPAP titration needing to be completed. MA advised patient to NOT sleep on his back, patient should avoid alcohol or sedatives prior to bed time and patient was advised to implement some regular exercise and weight management.

## 2017-02-03 NOTE — Telephone Encounter (Signed)
-----   Message from Tresa Garter, MD sent at 02/02/2017  9:51 AM EDT ----- Please inform patient that his sleep study showed significant sleep apnea with obstructions. RECOMMENDATIONS - CPAP titration to determine optimal pressure required to alleviate sleep disordered breathing. Ordered - Positional therapy: avoid laying on your back during sleep. - Avoid alcohol, sedatives and other brain depressants that may worsen sleep apnea and disrupt normal sleep architecture. - Weight management and regular exercise should be initiated or continued if appropriate

## 2017-02-11 ENCOUNTER — Ambulatory Visit (HOSPITAL_BASED_OUTPATIENT_CLINIC_OR_DEPARTMENT_OTHER): Payer: Medicaid Other | Attending: Internal Medicine | Admitting: Internal Medicine

## 2017-02-11 VITALS — Ht 71.0 in | Wt 215.0 lb

## 2017-02-11 DIAGNOSIS — G4733 Obstructive sleep apnea (adult) (pediatric): Secondary | ICD-10-CM

## 2017-02-15 ENCOUNTER — Other Ambulatory Visit: Payer: Self-pay | Admitting: Internal Medicine

## 2017-02-15 MED FILL — ATORVASTATIN 80 MG TABLET: 80 | 30 days supply | Qty: 30 | Fill #4

## 2017-02-15 MED FILL — ISOSORBIDE MN ER 30 MG TAB: 30 | 30 days supply | Qty: 30 | Fill #4

## 2017-02-15 MED FILL — BUPROPION HCL XL 150 MG TAB: 150 | 30 days supply | Qty: 30 | Fill #0

## 2017-02-15 MED FILL — ?CLOPIDOGREL 75 MG TABLET: 75 | 30 days supply | Qty: 30 | Fill #3

## 2017-02-15 MED FILL — ?PANTOPRAZOLE SOD DR 40MG: 40 MG | 30 days supply | Qty: 60 | Fill #4

## 2017-02-15 MED FILL — ?METOPROLOL 25 MG TABLET: 25 | 30 days supply | Qty: 60 | Fill #3

## 2017-02-20 DIAGNOSIS — G4733 Obstructive sleep apnea (adult) (pediatric): Secondary | ICD-10-CM | POA: Diagnosis not present

## 2017-02-20 NOTE — Procedures (Signed)
   Patient Name: [name], [name] Date: 02/11/2017 Gender: Male D.O.B: 28-Nov-1960 Age (years): 69 Referring Provider: Karle Plumber MD Height (inches): 71 Interpreting Physician: Baird Lyons MD, ABSM Weight (lbs): 215 RPSGT: Jorge Ny BMI: 30 MRN: [id number] Neck Size: 21.00 CLINICAL INFORMATION The patient is referred for a CPAP titration to treat sleep apnea.  Date of NPSG, Split Night or HST:  Diagnostic NPSG 01/12/17- AHI 86.7/ hr, Desaturation to 68%, body weight 220 lbs  SLEEP STUDY TECHNIQUE As per the AASM Manual for the Scoring of Sleep and Associated Events v2.3 (April 2016) with a hypopnea requiring 4% desaturations.  The channels recorded and monitored were frontal, central and occipital EEG, electrooculogram (EOG), submentalis EMG (chin), nasal and oral airflow, thoracic and abdominal wall motion, anterior tibialis EMG, snore microphone, electrocardiogram, and pulse oximetry. Continuous positive airway pressure (CPAP) was initiated at the beginning of the study and titrated to treat sleep-disordered breathing.  MEDICATIONS Medications self-administered by patient taken the night of the study : none reported  TECHNICIAN COMMENTS Comments added by technician: Patient had difficulty initiating sleep.  Comments added by scorer: N/A RESPIRATORY PARAMETERS Optimal PAP Pressure (cm): 20 AHI at Optimal Pressure (/hr): 2.8 Overall Minimal O2 (%): 79.00 Supine % at Optimal Pressure (%): 4 Minimal O2 at Optimal Pressure (%): 87.0   SLEEP ARCHITECTURE The study was initiated at 11:10:40 PM and ended at 5:15:29 AM.  Sleep onset time was 27.6 minutes and the sleep efficiency was 69.1%. The total sleep time was 252.0 minutes.  The patient spent 4.56% of the night in stage N1 sleep, 79.17% in stage N2 sleep, 0.00% in stage N3 and 16.27% in REM.Stage REM latency was 49.0 minutes  Wake after sleep onset was 85.2. Alpha intrusion was absent. Supine sleep was  32.34%.  CARDIAC DATA The 2 lead EKG demonstrated sinus rhythm. The mean heart rate was 63.25 beats per minute. Other EKG findings include: PVCs with bigeminy.  LEG MOVEMENT DATA The total Periodic Limb Movements of Sleep (PLMS) were 200. The PLMS index was 47.62. A PLMS index of <15 is considered normal in adults.  IMPRESSIONS - The optimal PAP pressure was 20 cm of water. - Mild Central Sleep Apnea was noted during this titration (CAI = 9.8/h). - Severe oxygen desaturations were observed during this titration (min O2 = 79.00%). At final pressure, minimum saturation was 87%, with 2.7 minutes recorded at - The patient snored with Soft snoring volume during this titration study. - 2-lead EKG demonstrated: PVCs - Moderate periodic limb movements were observed during this study. Arousals associated with PLMs were significant.  DIAGNOSIS - Obstructive Sleep Apnea (327.23 [G47.33 ICD-10])  RECOMMENDATIONS - Trial of CPAP therapy on 20 cm H2O with a Large size Resmed Full Face Mask AirFit F20 mask and heated humidification. - Avoid alcohol, sedatives and other CNS depressants that may worsen sleep apnea and disrupt normal sleep architecture. - Sleep hygiene should be reviewed to assess factors that may improve sleep quality. - Weight management and regular exercise should be initiated or continued.  [Electronically signed] 02/20/2017 11:57 AM  Baird Lyons MD, ABSM Diplomate, American Board of Sleep Medicine   NPI: [id number] [place], American Board of Sleep Medicine  ELECTRONICALLY SIGNED ON:  02/20/2017, 11:52 AM [place] PH: [phone]   FX: [phone] [place]

## 2017-02-22 ENCOUNTER — Other Ambulatory Visit: Payer: Self-pay | Admitting: Internal Medicine

## 2017-02-22 DIAGNOSIS — G4733 Obstructive sleep apnea (adult) (pediatric): Secondary | ICD-10-CM

## 2017-02-22 NOTE — Progress Notes (Signed)
Rxn for CPAP generated.

## 2017-02-25 ENCOUNTER — Telehealth: Payer: Self-pay

## 2017-02-25 NOTE — Telephone Encounter (Signed)
Contacted pt and made it aware that I will faxing his information and rx for a cpap machine to [facility] and they will be calling him. And reminded pt that his appointment is schedule for tomorrow at 1030. Pt states why does he need to see another physician. I informed pt that the last time we saw him was 10/2016 and that is when Dr. Janne Napoleon was here but she is not here any more and he will have to re-establish. Pt states he understands and doesn't have any questions or concerns and he will be here tomorrow for his appointment

## 2017-02-26 ENCOUNTER — Ambulatory Visit: Payer: Medicaid Other | Attending: Internal Medicine | Admitting: Internal Medicine

## 2017-02-26 ENCOUNTER — Encounter: Payer: Self-pay | Admitting: Internal Medicine

## 2017-02-26 VITALS — BP 143/84 | HR 76 | Temp 98.5°F | Resp 16 | Wt 228.4 lb

## 2017-02-26 DIAGNOSIS — I110 Hypertensive heart disease with heart failure: Secondary | ICD-10-CM | POA: Insufficient documentation

## 2017-02-26 DIAGNOSIS — G4733 Obstructive sleep apnea (adult) (pediatric): Secondary | ICD-10-CM | POA: Diagnosis not present

## 2017-02-26 DIAGNOSIS — I739 Peripheral vascular disease, unspecified: Secondary | ICD-10-CM | POA: Insufficient documentation

## 2017-02-26 DIAGNOSIS — Z8673 Personal history of transient ischemic attack (TIA), and cerebral infarction without residual deficits: Secondary | ICD-10-CM | POA: Diagnosis not present

## 2017-02-26 DIAGNOSIS — Z79899 Other long term (current) drug therapy: Secondary | ICD-10-CM | POA: Insufficient documentation

## 2017-02-26 DIAGNOSIS — Z7902 Long term (current) use of antithrombotics/antiplatelets: Secondary | ICD-10-CM | POA: Insufficient documentation

## 2017-02-26 DIAGNOSIS — F17210 Nicotine dependence, cigarettes, uncomplicated: Secondary | ICD-10-CM | POA: Insufficient documentation

## 2017-02-26 DIAGNOSIS — E6609 Other obesity due to excess calories: Secondary | ICD-10-CM | POA: Diagnosis not present

## 2017-02-26 DIAGNOSIS — I5032 Chronic diastolic (congestive) heart failure: Secondary | ICD-10-CM | POA: Diagnosis not present

## 2017-02-26 DIAGNOSIS — F17200 Nicotine dependence, unspecified, uncomplicated: Secondary | ICD-10-CM | POA: Diagnosis not present

## 2017-02-26 DIAGNOSIS — Z7982 Long term (current) use of aspirin: Secondary | ICD-10-CM | POA: Diagnosis not present

## 2017-02-26 DIAGNOSIS — Z6831 Body mass index (BMI) 31.0-31.9, adult: Secondary | ICD-10-CM | POA: Diagnosis not present

## 2017-02-26 DIAGNOSIS — D649 Anemia, unspecified: Secondary | ICD-10-CM | POA: Diagnosis not present

## 2017-02-26 DIAGNOSIS — Z951 Presence of aortocoronary bypass graft: Secondary | ICD-10-CM | POA: Insufficient documentation

## 2017-02-26 DIAGNOSIS — I252 Old myocardial infarction: Secondary | ICD-10-CM | POA: Insufficient documentation

## 2017-02-26 DIAGNOSIS — F1010 Alcohol abuse, uncomplicated: Secondary | ICD-10-CM | POA: Diagnosis not present

## 2017-02-26 DIAGNOSIS — I25119 Atherosclerotic heart disease of native coronary artery with unspecified angina pectoris: Secondary | ICD-10-CM | POA: Diagnosis not present

## 2017-02-26 DIAGNOSIS — E559 Vitamin D deficiency, unspecified: Secondary | ICD-10-CM | POA: Insufficient documentation

## 2017-02-26 MED ORDER — VITAMIN D 50 MCG (2000 UT) PO TABS: 2000.0000 [IU] | ORAL_TABLET | Freq: Every day | ORAL | 2 refills | Status: DC

## 2017-02-26 NOTE — Progress Notes (Signed)
Patient ID: [name]., male    DOB: 1961/08/03  MRN: [id number]  CC: re-establish   Subjective: [name] is a 56 y.o. male who presents to become established with me as PCP and for chronic disease management His concerns today include:  56 yr old with hx of HTN, HL, tob dep, former cocaine user, BL PAD with stent LT SFA, CAD s/p CABG 10/2008 followed by several ACS since then requiring stents last event 95/1884, chronic diastolic HF, CVA 08/6604, OSA  1. OSA: -I received the results of his titration study. He will need prescription for CPAP machine and equipment  2. Obesity -lacks energy to exercise and has significant claudication symptoms in both legs. Unable to walk half a block before developing pain in both thigh. He would like to be referred back to the vascular surgeon to be considered for procedure on the right lower extremity -Would like to have blood count check for anemia. He had mild anemia on blood work done several months ago -admits to large portion sizes. Drinks a lot of lemon aide  3. CAD: -endorses SOB and chest pain with exertion but has not taken SL Nitro in 3-6 mths -compliant with meds but did not take as yet for the morning - still smoking 1.5 pks/day. Chantix has not decreased his cravings. Tried patches in past "but I smoke on them too."  4. ETOH: "I have a few beers at night."  He quantifies it as 3-5 beers.   5. Vit D def.  Completed high dose Vit D 1 mth ago  Patient Active Problem List   Diagnosis Date Noted  . PAD (peripheral artery disease) ([place]) 09/23/2016  . Acute ST elevation myocardial infarction (STEMI) of inferior wall ([place]) 07/07/2016  . Morbid obesity ([place])   . Tobacco abuse   . Abdominal pain 08/07/2015  . GERD (gastroesophageal reflux disease) 08/07/2015  . Pancreatitis   . Other pancreatitis   . Pancreatitis, acute   . Abnormal CT scan, pancreas or bile duct   . PCP NOTES >>>>> 06/01/2015  . Annual physical exam  05/31/2015  . Stroke ~ 2008, 02-2015 02/20/2015  . Alcohol abuse 02/20/2015  . Hx of cocaine abuse 02/20/2015  . BPH (benign prostatic hyperplasia) 02/20/2015  . Lower urinary tract symptoms (LUTS) 05/05/2013  . OSA (obstructive sleep apnea) ?? 05/05/2013  . Poor compliance 05/05/2013  . Depression 05/05/2013  . Erectile dysfunction 03/17/2012  . OTHER LATE EFFECTS OF CEREBROVASCULAR DISEASE 08/06/2008  . ABNORMALITY OF GAIT 08/06/2008  . TOBACCO ABUSE 07/27/2008  . Dyslipidemia 07/26/2008  . HYPERTENSION, BENIGN ESSENTIAL 07/26/2008  . ST elevation myocardial infarction involving left circumflex coronary artery ([place]) 07/26/2008     Current Outpatient Prescriptions on File Prior to Visit  Medication Sig Dispense Refill  . aspirin 81 MG chewable tablet Chew 1 tablet (81 mg total) by mouth daily.    [name] atorvastatin (LIPITOR) 80 MG tablet Take 1 tablet (80 mg total) by mouth daily at 6 PM. 30 tablet 11  . buPROPion (WELLBUTRIN XL) 150 MG 24 hr tablet TAKE 1 TABLET BY MOUTH DAILY. 30 tablet 0  . clopidogrel (PLAVIX) 75 MG tablet Take 1 tablet (75 mg total) by mouth daily. 30 tablet 11  . isosorbide mononitrate (IMDUR) 30 MG 24 hr tablet Take 1 tablet (30 mg total) by mouth daily. 30 tablet 11  . ketoconazole (NIZORAL) 2 % cream Apply 1 application topically 2 (two) times daily. Rash behind ears 15 g 1  .  metoprolol tartrate (LOPRESSOR) 25 MG tablet Take 1 tablet (25 mg total) by mouth 2 (two) times daily. 60 tablet 11  . NITROSTAT 0.4 MG SL tablet PLACE 1 TABLET (0.4 MG TOTAL) UNDER THE TONGUE EVERY 5 MINUTES AS NEEDED FOR CHEST PAIN. 25 tablet 0  . pantoprazole (PROTONIX) 40 MG tablet Take 1 tablet (40 mg total) by mouth 2 (two) times daily before a meal. 60 tablet 5  . varenicline (CHANTIX CONTINUING MONTH PAK) 1 MG tablet Take 1 tablet (1 mg total) by mouth 2 (two) times daily. 56 tablet 3  . varenicline (CHANTIX STARTING MONTH PAK) 0.5 MG X 11 & 1 MG X 42 tablet Take one 0.5 mg tablet by  mouth once daily for 3 days, then increase to one 0.5 mg tablet twice daily for 4 days, then increase to one 1 mg tablet twice daily. 53 tablet 0   No current facility-administered medications on file prior to visit.     No Known Allergies  Social History   Social History  . Marital status: Divorced    Spouse name: N/A  . Number of children: 2  . Years of education: N/A   Occupational History  . not working , used to be a Chief Executive Officer     Social History Main Topics  . Smoking status: Current Every Day Smoker    Packs/day: 1.00    Years: 43.00    Types: Cigarettes  . Smokeless tobacco: Never Used  . Alcohol use 8.4 oz/week    14 Cans of beer per week     Comment: 09/23/2016 "couple beers/day"  . Drug use: No     Comment: 09/23/2016 "none in years"  . Sexual activity: Not Currently   Other Topics Concern  . Not on file   Social History Narrative   Lives w/ girlfriend     Family History  Problem Relation Age of Onset  . Heart disease Mother   . Heart failure Mother   . Diabetes Mother   . Colon cancer Father        late 39s  . Prostate cancer Neg Hx     Past Surgical History:  Procedure Laterality Date  . ABDOMINAL AORTOGRAM W/LOWER EXTREMITY N/A 09/23/2016   Procedure: Abdominal Aortogram w/Lower Extremity;  Surgeon: Wellington Hampshire, MD;  Location: [place];  Service: Cardiovascular;  Laterality: N/A;  . CARDIAC CATHETERIZATION  11/07/2008   EF of 50-55% -- normal LV systolic function, acute inferolateral ST elevation MI,  PTCA of the circumflex artery -- [name]. [name], [name]  . CARDIAC CATHETERIZATION N/A 07/07/2016   Procedure: Left Heart Cath and Cors/Grafts Angiography;  Surgeon: [name], MD;  Location: [facility];  Service: Cardiovascular;  Laterality: N/A;  . CARDIAC CATHETERIZATION N/A 07/07/2016   Procedure: Coronary Stent Intervention;  Surgeon: [name], MD;  Location: [place] [facility];  Service: Cardiovascular;   Laterality: N/A;  . CORONARY ANGIOPLASTY    . CORONARY ARTERY BYPASS GRAFT     "CABG X4"  . LEFT HEART CATHETERIZATION WITH CORONARY/GRAFT ANGIOGRAM N/A 05/15/2013   Procedure: LEFT HEART CATHETERIZATION WITH Beatrix Fetters;  Surgeon: [name], MD;  [facility];  Service: Cardiovascular;  Laterality: N/A;  . PERIPHERAL VASCULAR ATHERECTOMY Left 09/23/2016  . PERIPHERAL VASCULAR INTERVENTION  09/23/2016   Procedure: Peripheral Vascular Intervention;  Surgeon: Wellington Hampshire, MD;  Location: [place] [facility];  Service: Cardiovascular;;  Lt. SFA    ROS:  Review of Systems Negative except as stated above  PHYSICAL EXAM: BP (!) 143/84   Pulse 76   Temp 98.5 F (36.9 C) (Oral)   Resp 16   Wt 228 lb 6.4 oz (103.6 kg)   SpO2 95%   BMI 31.86 kg/m   Wt Readings from Last 3 Encounters:  02/26/17 228 lb 6.4 oz (103.6 kg)  02/11/17 215 lb (97.5 kg)  01/12/17 220 lb (99.8 kg)   Physical Exam General appearance - alert, well appearing, middle-age Caucasian male and in no distress Mental status - alert, oriented to person, place, and time, normal mood, behavior, speech, dress, motor activity, and thought processes Mouth - mucous membranes moist, pharynx normal without lesions Neck - supple, no significant adenopathy Chest - clear to auscultation, no wheezes, rales or rhonchi, symmetric air entry Heart - normal rate, regular rhythm, normal S1, S2, no murmurs, rubs, clicks or gallops Extremities - no lower extremity edema. Feet are warm. Dorsalis pedis pulses 3+ bilaterally   ASSESSMENT AND PLAN: 1. OSA (obstructive sleep apnea) -Prescription written for CPAP and supplies. -Patient advise on the importance of weight loss, avoiding EtOH and other sedatives at nights  2. TOBACCO ABUSE -Strongly advised to cut back with the intentions of quitting. Patient is aware that this puts him at significant risk for recurrent cardiovascular events, lung disease and various  cancers  3. Alcohol abuse -Encourage patient to cut back. We discussed health risks associated with excessive drinking.  He has set a goal of no more than 2 cans of beers a day  4. PAD (peripheral artery disease) ([place]) - Ambulatory referral to Vascular Surgery  5. Vitamin D deficiency - Cholecalciferol (VITAMIN D) 2000 units tablet; Take 1 tablet (2,000 Units total) by mouth daily.  Dispense: 60 tablet; Refill: 2  6. Class 1 obesity due to excess calories with serious comorbidity and body mass index (BMI) of 31.0 to 31.9 in adult Patient consultation extensively on dietary changes. Printed information also given.  7. Coronary artery disease involving native coronary artery of native heart with angina pectoris (HCC) -Continue statin, aspirin, Plavix and metoprolol  8. Anemia, unspecified type - CBC - Iron, TIBC and Ferritin Panel  Patient was given the opportunity to ask questions.  Patient verbalized understanding of the plan and was able to repeat key elements of the plan.   Orders Placed This Encounter  Procedures  . CBC  . Iron, TIBC and Ferritin Panel  . Ambulatory referral to Vascular Surgery     Requested Prescriptions   Signed Prescriptions Disp Refills  . Cholecalciferol (VITAMIN D) 2000 units tablet 60 tablet 2    Sig: Take 1 tablet (2,000 Units total) by mouth daily.    Return in about 3 months (around 05/29/2017).  Karle Plumber, MD, FACP

## 2017-02-26 NOTE — Patient Instructions (Signed)
Try to cut back to 2 beers a night as discussed today.  You have been referred to the Vascular surgeon Dr. Raeanne Barry  Follow a [place] can do it! Limit sugary drinks.  Avoid sodas, sweet tea, sport or energy drinks, or fruit drinks.  Drink water, lo-fat milk, or diet drinks. Limit snack foods.   Cut back on candy, cake, cookies, chips, ice cream.  These are a special treat, only in small amounts. Eat plenty of vegetables.  Especially dark green, red, and orange vegetables. Aim for at least 3 servings a day. More is better! Include fruit in your daily diet.  Whole fruit is much healthier than fruit juice! Limit "white" bread, "white" pasta, "white" rice.   Choose "100% whole grain" products, brown or wild rice. Avoid fatty meats. Try "Meatless Monday" and choose eggs or beans one day a week.  When eating meat, choose lean meats like chicken, [place], and fish.  Grill, broil, or bake meats instead of frying, and eat poultry without the skin. Eat less salt.  Avoid frozen pizzas, frozen dinners and salty foods.  Use seasonings other than salt in cooking.  This can help blood pressure and keep you from swelling Beer, wine and liquor have calories.  If you can safely drink alcohol, limit to 1 drink per day for women, 2 drinks for men

## 2017-02-27 ENCOUNTER — Other Ambulatory Visit: Payer: Self-pay | Admitting: Internal Medicine

## 2017-02-27 ENCOUNTER — Encounter: Payer: Self-pay | Admitting: Internal Medicine

## 2017-02-27 LAB — CBC
HEMATOCRIT: 42.6 % (ref 37.5–51.0)
HEMOGLOBIN: 13.3 g/dL (ref 13.0–17.7)
MCH: 26.6 pg (ref 26.6–33.0)
MCHC: 31.2 g/dL — ABNORMAL LOW (ref 31.5–35.7)
MCV: 85 fL (ref 79–97)
Platelets: 196 10*3/uL (ref 150–379)
RBC: 5 x10E6/uL (ref 4.14–5.80)
RDW: 20.5 % — ABNORMAL HIGH (ref 12.3–15.4)
WBC: 7.3 10*3/uL (ref 3.4–10.8)

## 2017-02-27 LAB — IRON,TIBC AND FERRITIN PANEL
FERRITIN: 32 ng/mL (ref 30–400)
IRON: 41 ug/dL (ref 38–169)
Iron Saturation: 12 % — ABNORMAL LOW (ref 15–55)
TIBC: 344 ug/dL (ref 250–450)
UIBC: 303 ug/dL (ref 111–343)

## 2017-02-27 MED ORDER — FERROUS SULFATE 325 (65 FE) MG PO TABS: ORAL_TABLET | ORAL | 2 refills | Status: DC

## 2017-03-01 MED FILL — FERROUS SULFATE 325 MG TAB: 325 (65 FE) | 30 days supply | Qty: 12 | Fill #0

## 2017-03-12 ENCOUNTER — Telehealth: Payer: Self-pay

## 2017-03-12 ENCOUNTER — Telehealth: Payer: Self-pay | Admitting: Internal Medicine

## 2017-03-12 NOTE — Telephone Encounter (Signed)
Pt called back to review results, please f/up °

## 2017-03-12 NOTE — Telephone Encounter (Signed)
Contacted pt to go over lab results pt didn't answer lvm asking pt to give me a call to go over lab results

## 2017-03-15 ENCOUNTER — Telehealth: Payer: Self-pay

## 2017-03-15 NOTE — Telephone Encounter (Signed)
Pt. Returned nurse call and I have him his results. Pt. Has questions regarding results. Please f/u

## 2017-03-15 NOTE — Telephone Encounter (Signed)
Returned pt call to go over lab results pt didn't answer lvm asking pt to give me a call back at his earliest convenience   If pt calls back please give results: iron level is low. Dr. Wynetta Emery recommend taking iron supplement three times a week. Rx has been sent to the pharmacy.  Also he is over due for his repeat colonoscopy. Last one was 09/2015 and was suppose to have repeat 09/2016. Could you find out if pt has had the repeat done if not can Dr. Wynetta Emery submit a referral?

## 2017-03-17 MED FILL — ?CLOPIDOGREL 75MG TAB: 75 | 30 days supply | Qty: 30 | Fill #4

## 2017-03-17 MED FILL — ATORVASTATIN 80 MG TABLET: 80 | 30 days supply | Qty: 30 | Fill #5

## 2017-03-17 MED FILL — METOPROLOL TARTRATE 25 MG T: 25 | 30 days supply | Qty: 60 | Fill #4

## 2017-03-17 MED FILL — ISOSORBIDE MN ER 30 MG TAB: 30 | 30 days supply | Qty: 30 | Fill #5

## 2017-03-18 ENCOUNTER — Other Ambulatory Visit: Payer: Self-pay | Admitting: Pharmacist

## 2017-03-18 MED ORDER — BUPROPION HCL ER (XL) 150 MG PO TB24: 150.0000 mg | ORAL_TABLET | Freq: Every day | ORAL | 2 refills | Status: DC

## 2017-03-18 MED FILL — BUPROPION HCL XL 150 MG TAB: 150 | 30 days supply | Qty: 30 | Fill #0

## 2017-03-29 MED FILL — FERROUS SULFATE 325 MG TAB: 325 (65 FE) | 30 days supply | Qty: 12 | Fill #1

## 2017-04-07 ENCOUNTER — Encounter: Payer: Self-pay | Admitting: Surgery

## 2017-04-09 ENCOUNTER — Encounter: Payer: Self-pay | Admitting: Internal Medicine

## 2017-04-14 ENCOUNTER — Encounter: Payer: Medicaid Other | Admitting: Surgery

## 2017-04-16 ENCOUNTER — Encounter: Payer: Self-pay | Admitting: Internal Medicine

## 2017-04-16 MED FILL — ATORVASTATIN 80 MG TABLET: 80 | 30 days supply | Qty: 30 | Fill #6

## 2017-04-16 MED FILL — BUPROPION HCL XL 150 MG TAB: 150 | 30 days supply | Qty: 30 | Fill #1

## 2017-04-16 MED FILL — ?METOPROLOL 25 MG TABLET: 25 | 30 days supply | Qty: 60 | Fill #5

## 2017-04-16 MED FILL — ?CLOPIDOGREL 75MG TAB: 75 | 30 days supply | Qty: 30 | Fill #5

## 2017-04-16 NOTE — Progress Notes (Unsigned)
Received a compliance report from [employer]. Patient has been using his CPAP 24 out of 30 days with 80% compliance. Willl have CMA call patient and encouraged him to use the CPAP every day.

## 2017-04-19 NOTE — Progress Notes (Signed)
Contacted pt and lvm for pt to give me a call at his earliest convenience

## 2017-04-27 ENCOUNTER — Ambulatory Visit: Payer: Medicaid Other | Admitting: Internal Medicine

## 2017-04-29 ENCOUNTER — Ambulatory Visit: Payer: Medicaid Other | Admitting: Internal Medicine

## 2017-05-17 ENCOUNTER — Other Ambulatory Visit: Payer: Self-pay | Admitting: Cardiovascular Disease

## 2017-05-17 DIAGNOSIS — I739 Peripheral vascular disease, unspecified: Secondary | ICD-10-CM

## 2017-05-20 MED FILL — ?METOPROLOL 25 MG TABLET: 25 | 30 days supply | Qty: 60 | Fill #6

## 2017-05-20 MED FILL — BUPROPION HCL XL 150 MG TAB: 150 | 30 days supply | Qty: 30 | Fill #2

## 2017-05-20 MED FILL — ?CLOPIDOGREL 75MG TAB: 75 | 30 days supply | Qty: 30 | Fill #6

## 2017-05-20 MED FILL — FERROUS SULFATE 325 MG TAB: 325 (65 FE) | 30 days supply | Qty: 12 | Fill #2

## 2017-05-20 MED FILL — ATORVASTATIN 80 MG TABLET: 80 | 30 days supply | Qty: 30 | Fill #7

## 2017-05-20 MED FILL — ISOSORBIDE MN ER 30 MG TAB: 30 | 30 days supply | Qty: 30 | Fill #6

## 2017-05-24 ENCOUNTER — Other Ambulatory Visit: Payer: Self-pay

## 2017-05-24 MED ORDER — PANTOPRAZOLE SODIUM 40 MG PO TBEC: 40.0000 mg | DELAYED_RELEASE_TABLET | Freq: Two times a day (BID) | ORAL | 0 refills | Status: DC

## 2017-05-24 NOTE — Telephone Encounter (Signed)
Indigestion with history of pancreatitis and GERD. We'll refill Protonix. Recommend he keep his appointment with GI in March.  Poor compliance secondary to being on disability   Patient called in about refill of protonix that wasn't sent in. I sent in one time refill and explained patient needs to follow up with his GI.

## 2017-05-31 ENCOUNTER — Encounter: Payer: Medicaid Other | Admitting: Surgery

## 2017-06-01 ENCOUNTER — Encounter (HOSPITAL_COMMUNITY): Payer: Medicaid Other

## 2017-06-08 ENCOUNTER — Ambulatory Visit: Payer: Medicaid Other | Admitting: Cardiovascular Disease

## 2017-06-08 ENCOUNTER — Ambulatory Visit (HOSPITAL_COMMUNITY)
Admission: RE | Admit: 2017-06-08 | Payer: Medicaid Other | Source: Ambulatory Visit | Attending: Cardiovascular Disease | Admitting: Cardiovascular Disease

## 2017-06-08 ENCOUNTER — Encounter: Payer: Self-pay | Admitting: *Deleted

## 2017-06-21 ENCOUNTER — Other Ambulatory Visit: Payer: Self-pay | Admitting: Internal Medicine

## 2017-06-21 MED FILL — ISOSORBIDE MN ER 30 MG TAB: 30 | 30 days supply | Qty: 30 | Fill #7

## 2017-06-21 MED FILL — FERROUS SULFATE 325 MG TAB: 325 (65 FE) | 30 days supply | Qty: 12 | Fill #3

## 2017-06-21 MED FILL — ?PANTOPRAZOLE SOD DR 40MG: 40 MG | 30 days supply | Qty: 60 | Fill #0

## 2017-06-21 MED FILL — ATORVASTATIN 80 MG TABLET: 80 | 30 days supply | Qty: 30 | Fill #8

## 2017-06-21 MED FILL — ?CLOPIDOGREL 75MG TAB: 75 | 30 days supply | Qty: 30 | Fill #7

## 2017-06-22 MED FILL — BUPROPION HCL XL 150 MG TAB: 150 | 30 days supply | Qty: 30 | Fill #0

## 2017-07-14 ENCOUNTER — Ambulatory Visit: Payer: Self-pay | Attending: Internal Medicine

## 2017-07-30 ENCOUNTER — Other Ambulatory Visit: Payer: Self-pay | Admitting: *Deleted

## 2017-07-30 MED ORDER — PANTOPRAZOLE SODIUM 40 MG PO TBEC: 40.0000 mg | DELAYED_RELEASE_TABLET | Freq: Two times a day (BID) | ORAL | 1 refills | Status: DC

## 2017-07-30 MED ORDER — ATORVASTATIN CALCIUM 80 MG PO TABS: 80.0000 mg | ORAL_TABLET | Freq: Every day | ORAL | 1 refills | Status: DC

## 2017-07-30 MED FILL — BUPROPION HCL XL 150 MG TAB: 150 | 30 days supply | Qty: 30 | Fill #1

## 2017-07-30 MED FILL — ?ISOSORBIDE MN 30 MG TAB SA: 30 | 30 days supply | Qty: 30 | Fill #8

## 2017-07-30 MED FILL — FERROUS SULFATE 325 MG TAB: 325 (65 FE) | 30 days supply | Qty: 12 | Fill #4

## 2017-07-30 MED FILL — ?CLOPIDOGREL 75MG TAB: 75 | 30 days supply | Qty: 30 | Fill #8

## 2017-07-30 NOTE — Telephone Encounter (Signed)
Pharmacy requesting that these be refilled by Dr Angelena Form. Patient does not have a recent lipid panel. Okay to refill both? Please advise. Thanks, MI

## 2017-07-30 NOTE — Telephone Encounter (Signed)
Pt is due for follow up with Dr. Angelena Form. I spoke with him and scheduled him to see Ermalinda Barrios, PA on 08/24/17 at 1:30.  He will eat a light breakfast before 6:30 AM and then fast prior to this appointment. Refills sent for one month with one refill.

## 2017-08-04 DIAGNOSIS — K449 Diaphragmatic hernia without obstruction or gangrene: Secondary | ICD-10-CM | POA: Insufficient documentation

## 2017-08-04 DIAGNOSIS — H53461 Homonymous bilateral field defects, right side: Secondary | ICD-10-CM | POA: Insufficient documentation

## 2017-08-04 DIAGNOSIS — K209 Esophagitis, unspecified without bleeding: Secondary | ICD-10-CM | POA: Insufficient documentation

## 2017-08-04 DIAGNOSIS — K760 Fatty (change of) liver, not elsewhere classified: Secondary | ICD-10-CM | POA: Insufficient documentation

## 2017-08-04 DIAGNOSIS — I2510 Atherosclerotic heart disease of native coronary artery without angina pectoris: Secondary | ICD-10-CM | POA: Insufficient documentation

## 2017-08-04 DIAGNOSIS — I5032 Chronic diastolic (congestive) heart failure: Secondary | ICD-10-CM | POA: Insufficient documentation

## 2017-08-04 DIAGNOSIS — J42 Unspecified chronic bronchitis: Secondary | ICD-10-CM | POA: Insufficient documentation

## 2017-08-04 DIAGNOSIS — F1010 Alcohol abuse, uncomplicated: Secondary | ICD-10-CM | POA: Insufficient documentation

## 2017-08-04 MED FILL — ?PANTOPRAZOLE SOD DR 40MG: 40 MG | 30 days supply | Qty: 60 | Fill #0

## 2017-08-04 MED FILL — ATORVASTATIN 80 MG TABLET: 80 | 30 days supply | Qty: 30 | Fill #0

## 2017-08-10 DIAGNOSIS — Z9289 Personal history of other medical treatment: Secondary | ICD-10-CM

## 2017-08-10 HISTORY — DX: Personal history of other medical treatment: Z92.89

## 2017-08-24 ENCOUNTER — Ambulatory Visit (INDEPENDENT_AMBULATORY_CARE_PROVIDER_SITE_OTHER): Payer: Medicare HMO | Admitting: Physician Assistant

## 2017-08-24 ENCOUNTER — Encounter: Payer: Self-pay | Admitting: Physician Assistant

## 2017-08-24 VITALS — BP 122/84 | HR 70 | Ht 70.0 in | Wt 227.4 lb

## 2017-08-24 DIAGNOSIS — R079 Chest pain, unspecified: Secondary | ICD-10-CM

## 2017-08-24 DIAGNOSIS — F17200 Nicotine dependence, unspecified, uncomplicated: Secondary | ICD-10-CM

## 2017-08-24 DIAGNOSIS — I739 Peripheral vascular disease, unspecified: Secondary | ICD-10-CM | POA: Diagnosis not present

## 2017-08-24 DIAGNOSIS — E785 Hyperlipidemia, unspecified: Secondary | ICD-10-CM

## 2017-08-24 DIAGNOSIS — I25708 Atherosclerosis of coronary artery bypass graft(s), unspecified, with other forms of angina pectoris: Secondary | ICD-10-CM | POA: Diagnosis not present

## 2017-08-24 DIAGNOSIS — I10 Essential (primary) hypertension: Secondary | ICD-10-CM

## 2017-08-24 MED ORDER — ISOSORBIDE MONONITRATE ER 60 MG PO TB24: 60.0000 mg | ORAL_TABLET | Freq: Every day | ORAL | 11 refills | Status: DC

## 2017-08-24 MED FILL — ISOSORBIDE MN ER 60 MG TAB: 60 | 30 days supply | Qty: 30 | Fill #0

## 2017-08-24 NOTE — Patient Instructions (Signed)
Medication Instructions:  Your physician has recommended you make the following change in your medication:  1. Increase Imdur (60 mg ) daily, sent in today to patient's requested pharmacy.   Labwork: Your physician recommends that you have lab work today: cmet/cbc/lipid   Testing/Procedures: Your physician has requested that you have a lower extremity arterial exercise duplex. During this test, exercise and ultrasound are used to evaluate arterial blood flow in the legs. Allow one hour for this exam. There are no restrictions or special instructions.  Your physician has requested that you have a lexiscan myoview. For further information please visit [email or web address]. Please follow instruction sheet, as given.    Follow-Up: Your physician recommends that you keep all scheduled  follow-up appointment with [name] and [name].   Any Other Special Instructions Will Be Listed Below (If Applicable).  Steps to Quit Smoking Smoking tobacco can be harmful to your health and can affect almost every organ in your body. Smoking puts you, and those around you, at risk for developing many serious chronic diseases. Quitting smoking is difficult, but it is one of the best things that you can do for your health. It is never too late to quit. What are the benefits of quitting smoking? When you quit smoking, you lower your risk of developing serious diseases and conditions, such as:  Lung cancer or lung disease, such as COPD.  Heart disease.  Stroke.  Heart attack.  Infertility.  Osteoporosis and bone fractures.  Additionally, symptoms such as coughing, wheezing, and shortness of breath may get better when you quit. You may also find that you get sick less often because your body is stronger at fighting off colds and infections. If you are pregnant, quitting smoking can help to reduce your chances of having a baby of low birth weight. How do I get ready to quit? When you decide to  quit smoking, create a plan to make sure that you are successful. Before you quit:  Pick a date to quit. Set a date within the next two weeks to give you time to prepare.  Write down the reasons why you are quitting. Keep this list in places where you will see it often, such as on your bathroom mirror or in your car or wallet.  Identify the people, places, things, and activities that make you want to smoke (triggers) and avoid them. Make sure to take these actions: ? Throw away all cigarettes at home, at work, and in your car. ? Throw away smoking accessories, such as Scientist, research (medical). ? Clean your car and make sure to empty the ashtray. ? Clean your home, including curtains and carpets.  Tell your family, friends, and coworkers that you are quitting. Support from your loved ones can make quitting easier.  Talk with your health care provider about your options for quitting smoking.  Find out what treatment options are covered by your health insurance.  What strategies can I use to quit smoking? Talk with your healthcare provider about different strategies to quit smoking. Some strategies include:  Quitting smoking altogether instead of gradually lessening how much you smoke over a period of time. Research shows that quitting "cold [place]" is more successful than gradually quitting.  Attending in-person counseling to help you build problem-solving skills. You are more likely to have success in quitting if you attend several counseling sessions. Even short sessions of 10 minutes can be effective.  Finding resources and support systems that can help you to quit  smoking and remain smoke-free after you quit. These resources are most helpful when you use them often. They can include: ? Online chats with a Social worker. ? Telephone quitlines. ? Careers information officer. ? Support groups or group counseling. ? Text messaging programs. ? Mobile phone applications.  Taking medicines to  help you quit smoking. (If you are pregnant or breastfeeding, talk with your health care provider first.) Some medicines contain nicotine and some do not. Both types of medicines help with cravings, but the medicines that include nicotine help to relieve withdrawal symptoms. Your health care provider may recommend: ? Nicotine patches, gum, or lozenges. ? Nicotine inhalers or sprays. ? Non-nicotine medicine that is taken by mouth.  Talk with your health care provider about combining strategies, such as taking medicines while you are also receiving in-person counseling. Using these two strategies together makes you more likely to succeed in quitting than if you used either strategy on its own. If you are pregnant or breastfeeding, talk with your health care provider about finding counseling or other support strategies to quit smoking. Do not take medicine to help you quit smoking unless told to do so by your health care provider. What things can I do to make it easier to quit? Quitting smoking might feel overwhelming at first, but there is a lot that you can do to make it easier. Take these important actions:  Reach out to your family and friends and ask that they support and encourage you during this time. Call telephone quitlines, reach out to support groups, or work with a counselor for support.  Ask people who smoke to avoid smoking around you.  Avoid places that trigger you to smoke, such as bars, parties, or smoke-break areas at work.  Spend time around people who do not smoke.  Lessen stress in your life, because stress can be a smoking trigger for some people. To lessen stress, try: ? Exercising regularly. ? Deep-breathing exercises. ? Yoga. ? Meditating. ? Performing a body scan. This involves closing your eyes, scanning your body from head to toe, and noticing which parts of your body are particularly tense. Purposefully relax the muscles in those areas.  Download or purchase mobile  phone or tablet apps (applications) that can help you stick to your quit plan by providing reminders, tips, and encouragement. There are many free apps, such as QuitGuide from the [employer] Office manager for Disease Control and Prevention). You can find other support for quitting smoking (smoking cessation) through smokefree.gov and other websites.  How will I feel when I quit smoking? Within the first 24 hours of quitting smoking, you may start to feel some withdrawal symptoms. These symptoms are usually most noticeable 2-3 days after quitting, but they usually do not last beyond 2-3 weeks. Changes or symptoms that you might experience include:  Mood swings.  Restlessness, anxiety, or irritation.  Difficulty concentrating.  Dizziness.  Strong cravings for sugary foods in addition to nicotine.  Mild weight gain.  Constipation.  Nausea.  Coughing or a sore throat.  Changes in how your medicines work in your body.  A depressed mood.  Difficulty sleeping (insomnia).  After the first 2-3 weeks of quitting, you may start to notice more positive results, such as:  Improved sense of smell and taste.  Decreased coughing and sore throat.  Slower heart rate.  Lower blood pressure.  Clearer skin.  The ability to breathe more easily.  Fewer sick days.  Quitting smoking is very challenging for most people.  Do not get discouraged if you are not successful the first time. Some people need to make many attempts to quit before they achieve long-term success. Do your best to stick to your quit plan, and talk with your health care provider if you have any questions or concerns. This information is not intended to replace advice given to you by your health care provider. Make sure you discuss any questions you have with your health care provider. Document Released: 07/21/2001 Document Revised: 03/24/2016 Document Reviewed: 12/11/2014 Elsevier Interactive Patient Education  [employer].     If you need a refill on your cardiac medications before your next appointment, please call your pharmacy.

## 2017-08-24 NOTE — Progress Notes (Signed)
Cardiology Office Note    Date:  08/24/2017   ID:  [name]., DOB 17-May-1961, MRN [id number]  PCP:  No primary care provider on file.  Cardiologist: [name], MD  No chief complaint on file.   History of Present Illness:  [name]. is a 57 y.o. male with past medical history of CAD (s/p CABG x 3 in 10/2008 following inferolateral STEMI, occluded SVG-OM by cath in 06/2009 treated with a BMS, cath in 2014 with 3/3 patent grafts), HTN, HLD, morbid obesity, polysubstance abuse, stroke, pancreatitis, IDA, esophagitis, colon polyps, and medical noncompliance who presented to [facility] ED on 07/07/2016 as a CODE STEMI treated with DES to the SVG to the OM.  Normal LV function by LV gram.  Prior to the procedure he had V. fib and required defibrillation x1.  Had dyspnea with Brilinta and was switched to Plavix and aspirin.  He was referred to Dr. Fletcher Anon for PV workup.  Lower extremity Dopplers 09/2016 showed occluded left SFA which was treated with atherectomy, drug coated balloon angioplasty and stenting.  There was moderate disease in the right SFA.  Reviewing the chart he is canceled multiple appointments with Dr. Velva Harman.  Last saw Dr. Angelena Form 10/2016 and was without angina tobacco cessation was encouraged.  Has missed several appointments in the past year for PVD because of affordability.  Patient comes in for yearly follow-up.  He has daily angina with little activity such as walking to the mailbox or washing dishes.  He does not need nitroglycerin.  It goes away when he stops.  He continues to smoke a pack and a half of cigarettes daily.  He says he wants to quit but just cannot.  Chantix did not work.  He is on Wellbutrin but it is not working.  He also has severe pain in his legs when he walks.  Last ABIs were 11/2016 and were stable but repeat was supposed to be done in 6 months.  Denies cocaine.  Drinks a couple beers daily.       Past Medical History:    Diagnosis Date  . Abnormality of gait   . Anxiety   . CAD (coronary artery disease)    a. 10/2008 Inferolateral STEMI: 3VD w/ occluded LCX (PTCA)-->CABG x 3 (LIMA->LAD, VG->OM, VG->PDA);  b. 06/2009 NSTEMI/PCI: VG->OM 100 (BMS);  c. 05/2013 native 3VD, 3/3 patent grafts. d. 06/2016: Inferior STEMI w/ occluded SVG-OM (DES placed). LIMA-LAD and SVG-PDA patent.  . Chronic bronchitis ([place])   . Chronic diastolic CHF (congestive heart failure) ([place])    a. 02/2015 Echo: EF 50-55%, distal septal, apical, inferobasal HK, mild LVH, mild MR, mildly dil LA.  [name] Cocaine use   . Colon polyps    a. 09/2015 colonscopy: multiple sessile polyps, path negative for high grade dysplasia.  . Depression   . Emphysema (subcutaneous) (surgical) resulting from a procedure 10/2008.   Bleb resected at time of 10/2008 CABG. Marketed emphysema noted on surgical report.  . Esophagitis    a. 2017 EGD: esophagitis, duodenitis.  [name] ETOH abuse   . GERD (gastroesophageal reflux disease)   . Hemianopia, homonymous, right    "can't see out the right side of either eye since stroke in 2016/pt description 09/23/2016  . Hepatic steatosis   . Hiatal hernia   . Hyperlipidemia   . Hypertension   . Iron deficiency anemia 2010  . Morbid obesity ([place])   . Myocardial infarction ([place]) "several"  . PAD (peripheral  artery disease) ([place])   . Pancreatitis 06/2015  . Sleep apnea    "don't have a mask" (09/23/2016)  . Stroke ([place]) 02/2015   a. 02/2015 Carotid U/S: 1-39% bilat ICA stenosis; "left me blind" (09/23/2016)  . Tobacco abuse    still smokes  . Tubular adenoma of colon     Past Surgical History:  Procedure Laterality Date  . ABDOMINAL AORTOGRAM W/LOWER EXTREMITY N/A 09/23/2016   Procedure: Abdominal Aortogram w/Lower Extremity;  Surgeon: Wellington Hampshire, MD;  Location: [facility];  Service: Cardiovascular;  Laterality: N/A;  . CARDIAC CATHETERIZATION  11/07/2008   EF of 50-55% -- normal LV systolic function, acute  inferolateral ST elevation MI,  PTCA of the circumflex artery -- [name]. [name], [name]  . CARDIAC CATHETERIZATION N/A 07/07/2016   Procedure: Left Heart Cath and Cors/Grafts Angiography;  Surgeon: [name], MD;  Location: [facility];  Service: Cardiovascular;  Laterality: N/A;  . CARDIAC CATHETERIZATION N/A 07/07/2016   Procedure: Coronary Stent Intervention;  Surgeon: [name], MD;  Location: [facility];  Service: Cardiovascular;  Laterality: N/A;  . CORONARY ANGIOPLASTY    . CORONARY ARTERY BYPASS GRAFT     "CABG X4"  . LEFT HEART CATHETERIZATION WITH CORONARY/GRAFT ANGIOGRAM N/A 05/15/2013   Procedure: LEFT HEART CATHETERIZATION WITH Beatrix Fetters;  Surgeon: [name], MD;  Location[facility];  Service: Cardiovascular;  Laterality: N/A;  . PERIPHERAL VASCULAR ATHERECTOMY Left 09/23/2016  . PERIPHERAL VASCULAR INTERVENTION  09/23/2016   Procedure: Peripheral Vascular Intervention;  Surgeon: Wellington Hampshire, MD;  Location: [facility];  Service: Cardiovascular;;  Lt. SFA    Current Medications: Current Meds  Medication Sig  . aspirin 81 MG chewable tablet Chew 1 tablet (81 mg total) by mouth daily.  [name] atorvastatin (LIPITOR) 80 MG tablet Take 1 tablet (80 mg total) by mouth daily at 6 PM.  . buPROPion (WELLBUTRIN XL) 150 MG 24 hr tablet TAKE 1 TABLET BY MOUTH DAILY.  [name] Cholecalciferol (VITAMIN D) 2000 units tablet Take 1 tablet (2,000 Units total) by mouth daily.  . clopidogrel (PLAVIX) 75 MG tablet Take 1 tablet (75 mg total) by mouth daily.  . ferrous sulfate (FERROUSUL) 325 (65 FE) MG tablet One tab PO Mon/Wed/Fri  . isosorbide mononitrate (IMDUR) 60 MG 24 hr tablet Take 1 tablet (60 mg total) by mouth daily.  [name] ketoconazole (NIZORAL) 2 % cream Apply 1 application topically 2 (two) times daily. Rash behind ears  . metoprolol tartrate (LOPRESSOR) 25 MG tablet Take 1 tablet (25 mg total) by mouth 2 (two) times daily.  [name] NITROSTAT 0.4  MG SL tablet PLACE 1 TABLET (0.4 MG TOTAL) UNDER THE TONGUE EVERY 5 MINUTES AS NEEDED FOR CHEST PAIN.  [name] pantoprazole (PROTONIX) 40 MG tablet Take 1 tablet (40 mg total) by mouth 2 (two) times daily before a meal.  . [DISCONTINUED] isosorbide mononitrate (IMDUR) 30 MG 24 hr tablet Take 1 tablet (30 mg total) by mouth daily.     Allergies:   Patient has no known allergies.   Social History   Socioeconomic History  . Marital status: Divorced    Spouse name: None  . Number of children: 2  . Years of education: None  . Highest education level: None  Social Needs  . Financial resource strain: None  . Food insecurity - worry: None  . Food insecurity - inability: None  . Transportation needs - medical: None  . Transportation  needs - non-medical: None  Occupational History  . Occupation: not working , used to be a Chief Executive Officer   Tobacco Use  . Smoking status: Current Every Day Smoker    Packs/day: 1.00    Years: 43.00    Pack years: 43.00    Types: Cigarettes  . Smokeless tobacco: Never Used  Substance and Sexual Activity  . Alcohol use: Yes    Alcohol/week: 8.4 oz    Types: 14 Cans of beer per week    Comment: 09/23/2016 "couple beers/day"  . Drug use: No    Comment: 09/23/2016 "none in years"  . Sexual activity: Not Currently  Other Topics Concern  . None  Social History Narrative   Lives w/ girlfriend      Family History:  The patient's family history includes Colon cancer in his father; Diabetes in his mother; Heart disease in his mother; Heart failure in his mother.   ROS:   Please see the history of present illness.    Review of Systems  Constitution: Negative.  HENT: Negative.   Cardiovascular: Positive for chest pain, claudication and dyspnea on exertion.  Respiratory: Positive for cough and shortness of breath.   Endocrine: Negative.   Hematologic/Lymphatic: Negative.   Musculoskeletal: Negative.   Gastrointestinal: Negative.   Genitourinary: Negative.     Neurological: Negative.    All other systems reviewed and are negative.   PHYSICAL EXAM:   VS:  BP 122/84   Pulse 70   Ht 5\' 10"  (1.778 m)   Wt 227 lb 6.4 oz (103.1 kg)   SpO2 96%   BMI 32.63 kg/m   Physical Exam  GEN: Well nourished, well developed, in no acute distress smells of cigarette smoke Neck: no JVD, carotid bruits, or masses Cardiac:RRR; no murmurs, rubs, or gallops  Respiratory: Decreased breath sounds without GI: soft, nontender, nondistended, + BS Ext: without cyanosis, clubbing, or edema, decreased distal pulses bilaterally Neuro:  Alert and Oriented x 3 Psych: euthymic mood, full affect  Wt Readings from Last 3 Encounters:  08/24/17 227 lb 6.4 oz (103.1 kg)  02/26/17 228 lb 6.4 oz (103.6 kg)  02/11/17 215 lb (97.5 kg)      Studies/Labs Reviewed:   EKG:  EKG is  ordered today.  The ekg ordered today demonstrates normal sinus rhythm with PVC nonspecific ST-T wave changes, and no acute change compared to prior EKGs  Recent Labs: 11/03/2016: BUN 14; Creatinine, Ser 0.89; Potassium 4.3; Sodium 139 02/26/2017: Hemoglobin 13.3; Platelets 196   Lipid Panel    Component Value Date/Time   CHOL 159 07/08/2016 0532   TRIG 147 07/08/2016 0532   HDL 33 (L) 07/08/2016 0532   CHOLHDL 4.8 07/08/2016 0532   VLDL 29 07/08/2016 0532   LDLCALC 97 07/08/2016 0532   LDLDIRECT 116.0 05/12/2013 1517    Additional studies/ records that were reviewed today include:   Cardiac Catheterization: 07/07/2016  The left ventricular systolic function is normal.  LV end diastolic pressure is moderately elevated.  The left ventricular ejection fraction is 55-65% by visual estimate.  Ost LAD to Prox LAD lesion, 95 %stenosed.  Prox LAD to Mid LAD lesion, 100 %stenosed.  Ost Cx to Mid Cx lesion, 100 %stenosed.  Prox RCA to Dist RCA lesion, 100 %stenosed.  SVG graft was visualized by angiography and is normal in caliber and anatomically normal.  LIMA graft was visualized by  angiography and is normal in caliber and anatomically normal.  SVG graft was visualized by angiography and  is large.  Origin to Prox Graft lesion, 100 %stenosed.  A STENT SYNERGY DES 4X20 drug eluting stent was successfully placed.  Post intervention, there is a 0% residual stenosis.   1. Severe 3 vessel obstructive CAD 2. Patent LIMA to the LAD 3. Occluded SVG to the OM. This is the culprit 4. Patent SVG to PDA 5. Good LV function 6. Elevated LVEDP 7. Successful PCI of the SVG to OM with DES   Plan: DAPT for one year. Risk factor modification    ASSESSMENT:    1. Chest pain, unspecified type   2. Coronary artery disease of bypass graft of native heart with stable angina pectoris ([place])   3. PAD (peripheral artery disease) ([place])   4. Essential hypertension   5. Dyslipidemia   6. TOBACCO ABUSE      PLAN:  In order of problems listed above:  Chest pain consistent with angina relieved with rest  CAD as described above chest post STEMI with V. fib, shocked x1 treated with DES to the SVG to the OM, patent LIMA to the LAD patent SVG to the PDA and normal LV function 06/2016.  He says he has regular angina relieved with rest.  It seems to have progressively worsened.  Unfortunately he continues to smoke a pack and a half of cigarettes daily.  Will increase Imdur to 60 mg once daily.  Do Lexiscan Myoview to rule out ischemia.  Follow-up with Dr. Angelena Form in 2 months.  PAD status post arthrectomy of the left SFA followed by drug-coated balloon angioplasty in 6 stenting of the mid segment 09/2016.  Follow-up lower extremity arterial Dopplers 11/2016 were stable.  He is due for 60-month Dopplers.   he has had increased claudication symptoms.  Follow-up with Dr. Fletcher Anon  Dyslipidemia patient is compliant with Lipitor and is fasting today.  Will check fasting lipids as well as other labs  Tobacco abuse unfortunately patient still smoking 1/2 packs of cigarettes daily.  Had a long discussion  about smoking cessation with him.  If he wants to stop Wellbutrin.  I have asked him to contact Dr. [name] to wean him off this.    Medication Adjustments/Labs and Tests Ordered: Current medicines are reviewed at length with the patient today.  Concerns regarding medicines are outlined above.  Medication changes, Labs and Tests ordered today are listed in the Patient Instructions below. There are no Patient Instructions on file for this visit.   Signed, Ermalinda Barrios, PA-C  08/24/2017 2:19 PM    [facility] [place], [place], [place]  [address] Phone: [phone]; Fax: [phone]

## 2017-08-25 LAB — LIPID PANEL
CHOLESTEROL TOTAL: 160 mg/dL (ref 100–199)
Chol/HDL Ratio: 4.1 ratio (ref 0.0–5.0)
HDL: 39 mg/dL — ABNORMAL LOW (ref >39)
LDL Calculated: 83 mg/dL (ref 0–99)
Triglycerides: 189 mg/dL — ABNORMAL HIGH (ref 0–149)
VLDL CHOLESTEROL CAL: 38 mg/dL (ref 5–40)

## 2017-08-25 LAB — COMPREHENSIVE METABOLIC PANEL
ALT: 23 IU/L (ref 0–44)
AST: 23 IU/L (ref 0–40)
Albumin/Globulin Ratio: 1.4 (ref 1.2–2.2)
Albumin: 4.2 g/dL (ref 3.5–5.5)
Alkaline Phosphatase: 104 IU/L (ref 39–117)
BILIRUBIN TOTAL: 1 mg/dL (ref 0.0–1.2)
BUN/Creatinine Ratio: 9 (ref 9–20)
BUN: 8 mg/dL (ref 6–24)
CHLORIDE: 96 mmol/L (ref 96–106)
CO2: 25 mmol/L (ref 20–29)
CREATININE: 0.87 mg/dL (ref 0.76–1.27)
Calcium: 9 mg/dL (ref 8.7–10.2)
GFR calc Af Amer: 113 mL/min/{1.73_m2} (ref >59)
GFR calc non Af Amer: 98 mL/min/{1.73_m2} (ref >59)
GLOBULIN, TOTAL: 2.9 g/dL (ref 1.5–4.5)
GLUCOSE: 96 mg/dL (ref 65–99)
Potassium: 4.4 mmol/L (ref 3.5–5.2)
SODIUM: 136 mmol/L (ref 134–144)
Total Protein: 7.1 g/dL (ref 6.0–8.5)

## 2017-08-25 LAB — CBC WITH DIFFERENTIAL/PLATELET
BASOS ABS: 0 10*3/uL (ref 0.0–0.2)
Basos: 0 %
EOS (ABSOLUTE): 0.1 10*3/uL (ref 0.0–0.4)
EOS: 1 %
HEMATOCRIT: 47.4 % (ref 37.5–51.0)
Hemoglobin: 16.9 g/dL (ref 13.0–17.7)
IMMATURE GRANULOCYTES: 0 %
Immature Grans (Abs): 0 10*3/uL (ref 0.0–0.1)
Lymphocytes Absolute: 1.6 10*3/uL (ref 0.7–3.1)
Lymphs: 22 %
MCH: 34.6 pg — ABNORMAL HIGH (ref 26.6–33.0)
MCHC: 35.7 g/dL (ref 31.5–35.7)
MCV: 97 fL (ref 79–97)
MONOS ABS: 0.8 10*3/uL (ref 0.1–0.9)
Monocytes: 10 %
NEUTROS PCT: 67 %
Neutrophils Absolute: 4.9 10*3/uL (ref 1.4–7.0)
PLATELETS: 181 10*3/uL (ref 150–379)
RBC: 4.88 x10E6/uL (ref 4.14–5.80)
RDW: 16.2 % — AB (ref 12.3–15.4)
WBC: 7.5 10*3/uL (ref 3.4–10.8)

## 2017-08-26 ENCOUNTER — Telehealth: Payer: Self-pay | Admitting: Physician Assistant

## 2017-08-26 MED ORDER — FENOFIBRATE 145 MG PO TABS: 145.0000 mg | ORAL_TABLET | Freq: Every day | ORAL | 3 refills | Status: DC

## 2017-08-26 NOTE — Telephone Encounter (Signed)
Follow Up: ° ° °Returning your call,concerning his lab results. °

## 2017-08-26 NOTE — Telephone Encounter (Signed)
-----   Message from Imogene Burn, PA-C sent at 08/25/2017  2:57 PM EST ----- Triglycerides high.  Recommend fenofibrate 145 mg once daily.  Other labs look good.

## 2017-08-27 ENCOUNTER — Ambulatory Visit (HOSPITAL_COMMUNITY): Payer: Medicare HMO | Attending: Cardiology

## 2017-08-27 DIAGNOSIS — R079 Chest pain, unspecified: Secondary | ICD-10-CM | POA: Insufficient documentation

## 2017-08-27 DIAGNOSIS — Z8249 Family history of ischemic heart disease and other diseases of the circulatory system: Secondary | ICD-10-CM | POA: Insufficient documentation

## 2017-08-27 DIAGNOSIS — I10 Essential (primary) hypertension: Secondary | ICD-10-CM | POA: Diagnosis not present

## 2017-08-27 DIAGNOSIS — R9439 Abnormal result of other cardiovascular function study: Secondary | ICD-10-CM | POA: Insufficient documentation

## 2017-08-27 DIAGNOSIS — I2510 Atherosclerotic heart disease of native coronary artery without angina pectoris: Secondary | ICD-10-CM | POA: Insufficient documentation

## 2017-08-27 DIAGNOSIS — Z8673 Personal history of transient ischemic attack (TIA), and cerebral infarction without residual deficits: Secondary | ICD-10-CM | POA: Diagnosis not present

## 2017-08-27 DIAGNOSIS — Z951 Presence of aortocoronary bypass graft: Secondary | ICD-10-CM | POA: Insufficient documentation

## 2017-08-27 LAB — MYOCARDIAL PERFUSION IMAGING
CHL CUP NUCLEAR SDS: 0
CHL CUP NUCLEAR SSS: 17
CSEPPHR: 87 {beats}/min
LHR: 0.35
LVDIAVOL: 198 mL (ref 62–150)
LVSYSVOL: 128 mL
NUC STRESS TID: 1.03
Rest HR: 67 {beats}/min
SRS: 17

## 2017-08-27 MED ORDER — TECHNETIUM TC 99M TETROFOSMIN IV KIT
10.1000 | PACK | Freq: Once | INTRAVENOUS | Status: AC | PRN
  Administered 2017-08-27: 10.1 via INTRAVENOUS
  Filled 2017-08-27: qty 11

## 2017-08-27 MED ORDER — TECHNETIUM TC 99M TETROFOSMIN IV KIT
32.5000 | PACK | Freq: Once | INTRAVENOUS | Status: AC | PRN
  Administered 2017-08-27: 32.5 via INTRAVENOUS
  Filled 2017-08-27: qty 33

## 2017-08-27 MED ORDER — REGADENOSON 0.4 MG/5ML IV SOLN
0.4000 mg | Freq: Once | INTRAVENOUS | Status: AC
  Administered 2017-08-27: 0.4 mg via INTRAVENOUS

## 2017-08-30 ENCOUNTER — Encounter: Payer: Self-pay | Admitting: Physician Assistant

## 2017-08-30 ENCOUNTER — Telehealth: Payer: Self-pay | Admitting: Physician Assistant

## 2017-08-30 DIAGNOSIS — R931 Abnormal findings on diagnostic imaging of heart and coronary circulation: Secondary | ICD-10-CM

## 2017-08-30 NOTE — Telephone Encounter (Signed)
New message ° °Pt verbalized that he is returning call for RN °

## 2017-08-30 NOTE — Telephone Encounter (Signed)
-----   Message from Liliane Shi, [place] sent at 08/30/2017  9:10 AM EST ----- Results reviewed for Kenneth Barrios, PA-C  Please call patient. The stress test shows evidence of his prior heart attack.  There is no sign of a new blockage (no ischemia). But the ejection fraction is low.  He needs an echocardiogram to better assess his ejection fraction. Please arrange an echocardiogram to assess LV function.  Richardson Dopp, PA-C    08/30/2017 9:03 AM

## 2017-08-31 MED FILL — ?CLOPIDOGREL 75MG TABLET: 75 | 30 days supply | Qty: 30 | Fill #9

## 2017-08-31 MED FILL — BUPROPION HCL XL 150 MG TAB: 150 | 30 days supply | Qty: 30 | Fill #2

## 2017-08-31 MED FILL — ATORVASTATIN 80 MG TABLET: 80 | 30 days supply | Qty: 30 | Fill #1

## 2017-08-31 MED FILL — ?PANTOPRAZOLE SOD DR 40MG: 40 MG | 30 days supply | Qty: 60 | Fill #1

## 2017-08-31 MED FILL — FERROUS SULFATE 325 MG TAB: 325 (65 FE) | 30 days supply | Qty: 12 | Fill #5

## 2017-09-03 ENCOUNTER — Ambulatory Visit (HOSPITAL_COMMUNITY): Admission: RE | Admit: 2017-09-03 | Payer: Medicare HMO | Source: Ambulatory Visit

## 2017-09-06 ENCOUNTER — Other Ambulatory Visit (HOSPITAL_COMMUNITY): Payer: Medicare HMO

## 2017-09-13 ENCOUNTER — Other Ambulatory Visit: Payer: Self-pay

## 2017-09-13 ENCOUNTER — Ambulatory Visit (HOSPITAL_COMMUNITY): Payer: Medicare HMO | Attending: Cardiovascular Disease

## 2017-09-13 DIAGNOSIS — G4730 Sleep apnea, unspecified: Secondary | ICD-10-CM | POA: Insufficient documentation

## 2017-09-13 DIAGNOSIS — I509 Heart failure, unspecified: Secondary | ICD-10-CM | POA: Insufficient documentation

## 2017-09-13 DIAGNOSIS — I252 Old myocardial infarction: Secondary | ICD-10-CM | POA: Insufficient documentation

## 2017-09-13 DIAGNOSIS — J42 Unspecified chronic bronchitis: Secondary | ICD-10-CM | POA: Insufficient documentation

## 2017-09-13 DIAGNOSIS — F1010 Alcohol abuse, uncomplicated: Secondary | ICD-10-CM | POA: Insufficient documentation

## 2017-09-13 DIAGNOSIS — R931 Abnormal findings on diagnostic imaging of heart and coronary circulation: Secondary | ICD-10-CM | POA: Diagnosis not present

## 2017-09-13 DIAGNOSIS — F1490 Cocaine use, unspecified, uncomplicated: Secondary | ICD-10-CM | POA: Insufficient documentation

## 2017-09-13 DIAGNOSIS — Z8673 Personal history of transient ischemic attack (TIA), and cerebral infarction without residual deficits: Secondary | ICD-10-CM | POA: Diagnosis not present

## 2017-09-13 DIAGNOSIS — Z6832 Body mass index (BMI) 32.0-32.9, adult: Secondary | ICD-10-CM | POA: Insufficient documentation

## 2017-09-13 DIAGNOSIS — I340 Nonrheumatic mitral (valve) insufficiency: Secondary | ICD-10-CM | POA: Diagnosis not present

## 2017-09-13 DIAGNOSIS — Z720 Tobacco use: Secondary | ICD-10-CM | POA: Diagnosis not present

## 2017-09-13 DIAGNOSIS — I2510 Atherosclerotic heart disease of native coronary artery without angina pectoris: Secondary | ICD-10-CM | POA: Insufficient documentation

## 2017-09-14 ENCOUNTER — Telehealth: Payer: Self-pay | Admitting: Physician Assistant

## 2017-09-14 ENCOUNTER — Telehealth: Payer: Self-pay | Admitting: Cardiovascular Disease

## 2017-09-14 ENCOUNTER — Telehealth: Payer: Self-pay | Admitting: *Deleted

## 2017-09-14 DIAGNOSIS — Z79899 Other long term (current) drug therapy: Secondary | ICD-10-CM

## 2017-09-14 MED ORDER — LISINOPRIL 5 MG PO TABS: 5.0000 mg | ORAL_TABLET | Freq: Every day | ORAL | 3 refills | Status: DC

## 2017-09-14 NOTE — Telephone Encounter (Signed)
Placed call to pt, per Ermalinda Barrios, PA-C, re: weakened heart muscle and pt needing to start Lisionpril 5 mg daily and repeat bmet 2 weeks afterwards. Left a message for pt to call back.

## 2017-09-14 NOTE — Telephone Encounter (Signed)
Returned pts call and he just wanted to ask about is echo results. Pt was advised that it did show a weakened heart muscle, and that's why we ordered Lisinopril for him to take daily and that we was waiting on Dr. Angelena Form to view it and we would contact him back.  Pt thanked me for the return call.

## 2017-09-14 NOTE — Telephone Encounter (Signed)
Pt returned my call.  He will start Lisinopril 5 mg daily and repeat BMET 09/29/17.

## 2017-09-14 NOTE — Telephone Encounter (Deleted)
Called and made patient aware of echo results and that Dr. Angelena Form has reviewed his echo and myoview and since he has been having chest pain daily and now with new heart dysfunction that he should have a heart cath. Made him aware that we need to schedule an appointment for him to come in to discuss cath. CM does not have any appointments available this week. Scheduled patient to come in on Monday with Ermalinda Barrios, at 2:00 PM. Patient verbalizes understanding. Patient denies having to use NTG but does have some on hand if needed. ER precautions reviewed with the patient. Advised patient to let us know if his symptoms worsen before his appointment. Patient verbalized understanding and is in agreement with the plan.

## 2017-09-14 NOTE — Telephone Encounter (Signed)
New message ° ° ° °Patient calling for echo results. Please call °

## 2017-09-14 NOTE — Telephone Encounter (Signed)
Called and made patient aware of echo results and that Dr. Angelena Form has reviewed his echo and myoview and since he has been having chest pain daily and now with new heart dysfunction that he should have a heart cath. Made him aware that we need to schedule an appointment for him to come in to discuss cath. CM does not have any appointments available this week. Scheduled patient to come in on Monday with Ermalinda Barrios, at 2:00 PM. Patient verbalizes understanding. Patient denies having to use NTG but does have some on hand if needed. ER precautions reviewed with the patient. Advised patient to let us know if his symptoms worsen before his appointment. Patient verbalized understanding and is in agreement with the plan.

## 2017-09-14 NOTE — Telephone Encounter (Signed)
-----   Message from [name], [place] sent at 09/14/2017  2:49 PM EST ----- Dr. Angelena Form reviewed his echo and Myoview and since he is having daily chest pain and new heart dysfunction he recommends he have a cardiac catheterization.  Please add him onto my schedule next week to schedule cath unless Dr. Angelena Form has an opening this week.

## 2017-09-14 NOTE — Telephone Encounter (Signed)
-----   Message from Imogene Burn, PA-C sent at 09/14/2017  7:58 AM EST ----- Regarding: echo Please let patient know his echo shows a weakened heart muscle similar to myoview. Please have him start lisinopril 5 mg once daily to help pumping function of heart. Dr. Angelena Form is reviewing. Repeat bmet 2 weeks after starting lisinopril. Thanks, Selinda Eon

## 2017-09-14 NOTE — Telephone Encounter (Signed)
Patient calling would like to go echo results

## 2017-09-20 ENCOUNTER — Encounter (INDEPENDENT_AMBULATORY_CARE_PROVIDER_SITE_OTHER): Payer: Self-pay

## 2017-09-20 ENCOUNTER — Ambulatory Visit (INDEPENDENT_AMBULATORY_CARE_PROVIDER_SITE_OTHER): Payer: Medicare HMO | Admitting: Physician Assistant

## 2017-09-20 ENCOUNTER — Other Ambulatory Visit: Payer: Medicare HMO | Admitting: *Deleted

## 2017-09-20 ENCOUNTER — Encounter: Payer: Self-pay | Admitting: Physician Assistant

## 2017-09-20 VITALS — BP 164/90 | HR 74 | Ht 70.0 in | Wt 223.8 lb

## 2017-09-20 DIAGNOSIS — Z79899 Other long term (current) drug therapy: Secondary | ICD-10-CM | POA: Diagnosis not present

## 2017-09-20 DIAGNOSIS — I739 Peripheral vascular disease, unspecified: Secondary | ICD-10-CM

## 2017-09-20 DIAGNOSIS — I10 Essential (primary) hypertension: Secondary | ICD-10-CM

## 2017-09-20 DIAGNOSIS — E785 Hyperlipidemia, unspecified: Secondary | ICD-10-CM

## 2017-09-20 DIAGNOSIS — F17200 Nicotine dependence, unspecified, uncomplicated: Secondary | ICD-10-CM

## 2017-09-20 DIAGNOSIS — I5032 Chronic diastolic (congestive) heart failure: Secondary | ICD-10-CM | POA: Diagnosis not present

## 2017-09-20 DIAGNOSIS — R079 Chest pain, unspecified: Secondary | ICD-10-CM

## 2017-09-20 DIAGNOSIS — I25708 Atherosclerosis of coronary artery bypass graft(s), unspecified, with other forms of angina pectoris: Secondary | ICD-10-CM | POA: Diagnosis not present

## 2017-09-20 MED ORDER — FENOFIBRATE 145 MG PO TABS: 145.0000 mg | ORAL_TABLET | Freq: Every day | ORAL | 3 refills | Status: DC

## 2017-09-20 NOTE — H&P (View-Only) (Signed)
Cardiology Office Note    Date:  09/20/2017   ID:  [name], DOB [birth year], MRN [id number]  PCP:  Colon Branch, MD  Cardiologist: [name], MD  No chief complaint on file.   History of Present Illness:  Marcellino Fidalgo. is a 57 y.o. male with past medical history of CAD (s/p CABG x 3 in 10/2008 following inferolateral STEMI, occluded SVG-OM by cath in 06/2009 treated with a BMS, cath in 2014 with 3/3 patent grafts), HTN, HLD, morbid obesity, polysubstance abuse, stroke, pancreatitis, IDA, esophagitis, colon polyps, and medical noncompliance who presented to [facility] ED on 07/07/2016 as a CODE STEMI treated with DES to the SVG to the OM.  Normal LV function by LV gram.  Prior to the procedure he had V. fib and required defibrillation x1.  Had dyspnea with Brilinta and was switched to Plavix and aspirin.  He was referred to Dr. Fletcher Anon for PV workup.  Lower extremity Dopplers 09/2016 showed occluded left SFA which was treated with atherectomy, drug coated balloon angioplasty and stenting.  There was moderate disease in the right SFA.  Reviewing the chart he is canceled multiple appointments with Dr. Fletcher Anon.   I saw the patient 08/24/17 complaining of daily angina with very little activity.  Continues to smoke a pack and a half of cigarettes daily.  Not he also had severe pain in his legs when he walks.  He drinks beer daily.  2D echo 09/13/17 showed LVEF 45-50% with hypokinesis of the apical anterior septal, anterior anterior lateral and apical myocardium as well as possible hypokinesis of the basal mid inferior myocardium.  Also grade 2 DD.  Nuclear stress test EF 35% with lateral hypokinesis with a fixed medium-sized severe basal to apical inferolateral and anterior lateral perfusion defect.  There is also fixed small moderate intensity apical septal/apical perfusion defect suggestive of prior infarction without significant ischemia.  After reviewing the above tests with Dr.  Angelena Form and the fact that patient was having daily angina cardiac catheterization was recommended to further assess.  Patient comes in today accompanied by his daughter-in-law.  He debated a long time about whether or not he should have heart cath done.  He says he has chronic angina but is not sure if it has gotten any worse.  He has had worsening dyspnea on exertion and fatigue.  He is not willing to quit smoking.  She is asking when his COPD will be treated.     Past Medical History:  Diagnosis Date  . Abnormality of gait   . Anxiety   . CAD (coronary artery disease)    a. 10/2008 Inferolateral STEMI: 3VD w/ occluded LCX (PTCA)-->CABG x 3 (LIMA->LAD, VG->OM, VG->PDA);  b. 06/2009 NSTEMI/PCI: VG->OM 100 (BMS);  c. 05/2013 native 3VD, 3/3 patent grafts. d. 06/2016: Inferior STEMI w/ occluded SVG-OM (DES placed). LIMA-LAD and SVG-PDA patent.  . Chronic bronchitis ([place])   . Chronic diastolic CHF (congestive heart failure) ([place])    a. 02/2015 Echo: EF 50-55%, distal septal, apical, inferobasal HK, mild LVH, mild MR, mildly dil LA.  [name] Cocaine use   . Colon polyps    a. 09/2015 colonscopy: multiple sessile polyps, path negative for high grade dysplasia.  . Depression   . Emphysema (subcutaneous) (surgical) resulting from a procedure 10/2008.   Bleb resected at time of 10/2008 CABG. Marketed emphysema noted on surgical report.  . Esophagitis    a. 2017 EGD: esophagitis, duodenitis.  [name] ETOH abuse   .  GERD (gastroesophageal reflux disease)   . Hemianopia, homonymous, right    "can't see out the right side of either eye since stroke in 2016/pt description 09/23/2016  . Hepatic steatosis   . Hiatal hernia   . History of nuclear stress test 08/2017   Nuclear stress test 1/19: EF 35, inf-lat and ant-lat, apical sept/apical scar; no ischemia; Intermediate Risk  . Hyperlipidemia   . Hypertension   . Iron deficiency anemia 2010  . Morbid obesity ([place])   . Myocardial infarction ([place]) "several"  .  PAD (peripheral artery disease) ([place])   . Pancreatitis 06/2015  . Sleep apnea    "don't have a mask" (09/23/2016)  . Stroke ([place]) 02/2015   a. 02/2015 Carotid U/S: 1-39% bilat ICA stenosis; "left me blind" (09/23/2016)  . Tobacco abuse    still smokes  . Tubular adenoma of colon     Past Surgical History:  Procedure Laterality Date  . ABDOMINAL AORTOGRAM W/LOWER EXTREMITY N/A 09/23/2016   Procedure: Abdominal Aortogram w/Lower Extremity;  Surgeon: Wellington Hampshire, MD;  Location: [facility];  Service: Cardiovascular;  Laterality: N/A;  . CARDIAC CATHETERIZATION  11/07/2008   EF of 50-55% -- normal LV systolic function, acute inferolateral ST elevation MI,  PTCA of the circumflex artery -- [name]. [name], [name]  . CARDIAC CATHETERIZATION N/A 07/07/2016   Procedure: Left Heart Cath and Cors/Grafts Angiography;  Surgeon: [name], MD;  Location: [facility];  Service: Cardiovascular;  Laterality: N/A;  . CARDIAC CATHETERIZATION N/A 07/07/2016   Procedure: Coronary Stent Intervention;  Surgeon: [name], MD;  Location: [facility];  Service: Cardiovascular;  Laterality: N/A;  . CORONARY ANGIOPLASTY    . CORONARY ARTERY BYPASS GRAFT     "CABG X4"  . LEFT HEART CATHETERIZATION WITH CORONARY/GRAFT ANGIOGRAM N/A 05/15/2013   Procedure: LEFT HEART CATHETERIZATION WITH Beatrix Fetters;  Surgeon: [name], MD;  Location: [facility] C[facility];  Service: Cardiovascular;  Laterality: N/A;  . PERIPHERAL VASCULAR ATHERECTOMY Left 09/23/2016  . PERIPHERAL VASCULAR INTERVENTION  09/23/2016   Procedure: Peripheral Vascular Intervention;  Surgeon: Wellington Hampshire, MD;  Location: [facility];  Service: Cardiovascular;;  Lt. SFA    Current Medications: Current Meds  Medication Sig  . aspirin 81 MG chewable tablet Chew 1 tablet (81 mg total) by mouth daily.  [name] atorvastatin (LIPITOR) 80 MG tablet Take 1 tablet (80 mg total) by mouth daily at 6 PM.  . buPROPion  (WELLBUTRIN XL) 150 MG 24 hr tablet TAKE 1 TABLET BY MOUTH DAILY.  [name] Cholecalciferol (VITAMIN D) 2000 units tablet Take 1 tablet (2,000 Units total) by mouth daily.  . clopidogrel (PLAVIX) 75 MG tablet Take 1 tablet (75 mg total) by mouth daily.  . ferrous sulfate 325 (65 FE) MG tablet Take one tablet by mouth on  Mon/Wed/Fri  . isosorbide mononitrate (IMDUR) 60 MG 24 hr tablet Take 1 tablet (60 mg total) by mouth daily.  [name] lisinopril (PRINIVIL,ZESTRIL) 5 MG tablet Take 1 tablet (5 mg total) by mouth daily.  . metoprolol tartrate (LOPRESSOR) 25 MG tablet Take 1 tablet (25 mg total) by mouth 2 (two) times daily.  [name] NITROSTAT 0.4 MG SL tablet PLACE 1 TABLET (0.4 MG TOTAL) UNDER THE TONGUE EVERY 5 MINUTES AS NEEDED FOR CHEST PAIN.  [name] pantoprazole (PROTONIX) 40 MG tablet Take 1 tablet (40 mg total) by mouth 2 (two) times daily before a meal.     Allergies:  Patient has no allergy information on record.   Social History   Socioeconomic History  . Marital status: Divorced    Spouse name: None  . Number of children: 2  . Years of education: None  . Highest education level: None  Social Needs  . Financial resource strain: None  . Food insecurity - worry: None  . Food insecurity - inability: None  . Transportation needs - medical: None  . Transportation needs - non-medical: None  Occupational History  . Occupation: not working , used to be a Chief Executive Officer   Tobacco Use  . Smoking status: Current Every Day Smoker    Packs/day: 1.00    Years: 43.00    Pack years: 43.00    Types: Cigarettes  . Smokeless tobacco: Never Used  Substance and Sexual Activity  . Alcohol use: Yes    Alcohol/week: 8.4 oz    Types: 14 Cans of beer per week    Comment: 09/23/2016 "couple beers/day"  . Drug use: No    Comment: 09/23/2016 "none in years"  . Sexual activity: Not Currently  Other Topics Concern  . None  Social History Narrative   Lives w/ girlfriend      Family History:  The patient's  family history includes Colon cancer in his father; Diabetes in his mother; Heart disease in his mother; Heart failure in his mother.   ROS:   Please see the history of present illness.    Review of Systems  Constitution: Positive for weakness and malaise/fatigue.  HENT: Negative.   Cardiovascular: Positive for chest pain, claudication and dyspnea on exertion.  Respiratory: Positive for shortness of breath.   Endocrine: Negative.   Hematologic/Lymphatic: Negative.   Musculoskeletal: Positive for back pain.  Gastrointestinal: Negative.   Genitourinary: Negative.   Psychiatric/Behavioral: Positive for depression. The patient is nervous/anxious.    All other systems reviewed and are negative.   PHYSICAL EXAM:   VS:  BP (!) 164/90   Pulse 74   Ht 5\' 10"  (1.778 m)   Wt 223 lb 12.8 oz (101.5 kg)   SpO2 96%   BMI 32.11 kg/m    Physical Exam  GEN: Obese, in no acute distress  Neck: no JVD, carotid bruits, or masses Cardiac:RRR; no murmurs, rubs, or gallops  Respiratory:  clear to auscultation bilaterally, normal work of breathing GI: soft, nontender, nondistended, + BS Ext: without cyanosis, clubbing, or edema, Good distal pulses bilaterally Neuro:  Alert and Oriented x 3 Psych: euthymic mood, full affect  Wt Readings from Last 3 Encounters:  09/20/17 223 lb 12.8 oz (101.5 kg)  08/24/17 227 lb 6.4 oz (103.1 kg)  02/26/17 228 lb 6.4 oz (103.6 kg)      Studies/Labs Reviewed:   EKG:  EKG is  ordered today.   Recent Labs: 08/24/2017: ALT 23; BUN 8; Creatinine, Ser 0.87; Hemoglobin 16.9; Platelets 181; Potassium 4.4; Sodium 136   Lipid Panel    Component Value Date/Time   CHOL 160 08/24/2017 1438   TRIG 189 (H) 08/24/2017 1438   HDL 39 (L) 08/24/2017 1438   CHOLHDL 4.1 08/24/2017 1438   CHOLHDL 4.8 07/08/2016 0532   VLDL 29 07/08/2016 0532   LDLCALC 83 08/24/2017 1438   LDLDIRECT 116.0 05/12/2013 1517    Additional studies/ records that were reviewed today include:    Nuclear stress test 08/27/17 Study Highlights      Nuclear stress EF: 35%.  There was no ST segment deviation noted during stress.  Findings consistent with  prior myocardial infarction.  This is an intermediate risk study.  The left ventricular ejection fraction is moderately decreased (30-44%).   1. EF 35%, lateral hypokinesis.  2. Fixed medium-sized, severe basal to apical inferolateral and anterolateral perfusion defect. Fixed small, moderate intensity apical septal/apical perfusion defect.  These findings are suggestive of prior infarction without significant ischemia.    Intermediate risk study due to low EF.     2D echo 09/13/17 Study Conclusions   - Left ventricle: The cavity size was normal. There was mild   concentric hypertrophy. Systolic function was mildly reduced. The   estimated ejection fraction was in the range of 45% to 50%.   Hypokinesis of the apicalanteroseptal, anterior, anterolateral,   and apical myocardium; consistent with infarction in the   distribution of the left anterior descending coronary artery.   Possible hypokinesis of the basal-midinferolateral myocardium.   Features are consistent with a pseudonormal left ventricular   filling pattern, with concomitant abnormal relaxation and   increased filling pressure (grade 2 diastolic dysfunction). - Mitral valve: Calcified annulus. There was moderate regurgitation   directed eccentrically and posteriorly. - Left atrium: The atrium was severely dilated. - Right atrium: The atrium was moderately to severely dilated.   Impressions:   - Repaet evaluation with Definity contrast would allow more   confident evaluation of LV wall motion abnormalities.   Cardiac cath 11/ 2017 Procedures   Coronary Stent Intervention  Left Heart Cath and Cors/Grafts Angiography  Conclusion      The left ventricular systolic function is normal.  LV end diastolic pressure is moderately elevated.  The left  ventricular ejection fraction is 55-65% by visual estimate.  Ost LAD to Prox LAD lesion, 95 %stenosed.  Prox LAD to Mid LAD lesion, 100 %stenosed.  Ost Cx to Mid Cx lesion, 100 %stenosed.  Prox RCA to Dist RCA lesion, 100 %stenosed.  SVG graft was visualized by angiography and is normal in caliber and anatomically normal.  LIMA graft was visualized by angiography and is normal in caliber and anatomically normal.  SVG graft was visualized by angiography and is large.  Origin to Prox Graft lesion, 100 %stenosed.  A STENT SYNERGY DES 4X20 drug eluting stent was successfully placed.  Post intervention, there is a 0% residual stenosis.   1. Severe 3 vessel obstructive CAD 2. Patent LIMA to the LAD 3. Occluded SVG to the OM. This is the culprit 4. Patent SVG to PDA 5. Good LV function 6. Elevated LVEDP 7. Successful PCI of the SVG to OM with DES   Plan: DAPT for one year. Risk factor modification.        ASSESSMENT:    1. Chest pain, unspecified type   2. Coronary artery disease of bypass graft of native heart with stable angina pectoris ([place])   3. HYPERTENSION, BENIGN ESSENTIAL   4. Chronic diastolic CHF (congestive heart failure) ([place])   5. PAD (peripheral artery disease) ([place])   6. TOBACCO ABUSE   7. Dyslipidemia      PLAN:  In order of problems listed above:  Chest pain described as daily angina with new LV dysfunction on echo and Myoview.  Intermediate risk Myoview without significant ischemia.  Reviewed with Dr. Angelena Form who recommends repeat cardiac catheterization. I have reviewed the risks, indications, and alternatives to angioplasty and stenting with the patient. Risks include but are not limited to bleeding, infection, vascular injury, stroke, myocardial infection, arrhythmia, kidney injury, radiation-related injury in the case of prolonged fluoroscopy use,  emergency cardiac surgery, and death. The patient understands the risks of serious complication is low  (<6%) and he agrees to proceed.    CAD status post STEMI with V. fib, shocked 1 treated with DES to the SVG to the OM. Patent LIMA to the LAD, patent SVG to the PDA, normal LV function. Patient has had chronic angina since his MI but it has progressively worsened.  Imdur 60 mg daily does not seem to have helped.  He continues to smoke.  Cardiac cath as discussed above.  Essential hypertension blood pressure high today but he has not taken any of his medications.  Chronic diastolic CHF compensated without heart failure.   PAD status post atherectomy of the left SFA followed by drug-coated balloon angioplasty in 6 stenting of the mid segment 09/2016.  Dopplers in 11/2016 were stable.  He has missed 2 appointments to have it rechecked.  He has terrible claudication symptoms.  Will reorder.  Follow-up with Dr. Fletcher Anon  Tobacco abuse continues to smoke half a pack daily.  Smoking cessation has been discussed.  He did not tolerate Chantix.  He is on Wellbutrin.  Hyperlipidemia triglycerides were up at 189 LDL was 83.  Patient has not been taking his TriCor but has been taking his Lipitor.  Will resume TriCor.   Medication Adjustments/Labs and Tests Ordered: Current medicines are reviewed at length with the patient today.  Concerns regarding medicines are outlined above.  Medication changes, Labs and Tests ordered today are listed in the Patient Instructions below. Patient Instructions  Medication Instructions:  Your physician recommends that you continue on your current medications as directed. Please refer to the Current Medication list given to you today.   Labwork: TODAY:  BMET, CBC, & PT/INR  Testing/Procedures: Your physician has requested that you have a cardiac catheterization. Cardiac catheterization is used to diagnose and/or treat various heart conditions. Doctors may recommend this procedure for a number of different reasons. The most common reason is to evaluate chest pain. Chest pain  can be a symptom of coronary artery disease (CAD), and cardiac catheterization can show whether plaque is narrowing or blocking your heart's arteries. This procedure is also used to evaluate the valves, as well as measure the blood flow and oxygen levels in different parts of your heart. For further information please visit HugeFiesta.tn. Please follow instruction sheet, as given.   Your physician has requested that you have a lower extremity arterial exercise duplex. During this test, exercise and ultrasound are used to evaluate arterial blood flow in the legs. Allow one hour for this exam. There are no restrictions or special instructions   Follow-Up: Your physician recommends that you schedule a follow-up appointment in: 2 WEEKS FROM 10/01/17 WITH DR. Angelena Form OR Ermalinda Barrios, PA-C  Your physician recommends that you schedule a follow-up appointment in: [place] (AFTER HIS LOWER EXTREMITY IS DONE)    Any Other Special Instructions Will Be Listed Below (If Applicable).   [facility] OFFICE [address] [place] [address] [place] [address] Dept: [phone] Loc: [place].  09/20/2017  You are scheduled for a Cardiac Catheterization on Friday, February 22 with Dr. [name].  1. Please arrive at the [facility] (Main Entrance A) at [facility]: [address] [place], [place] [address] at 5:30 AM (two hours before your procedure to ensure your preparation). Free valet parking service is available.  Special note: Every effort is made to have your procedure done on time. Please understand that emergencies sometimes delay scheduled procedures.  2. Diet: Do not eat or drink anything after midnight prior to your procedure except sips of water to take medications.  3. Labs:  WILL BE DRAWN TODAY  4. Medication instructions in preparation for your  procedure:   On the morning of your procedure, take your Aspirin & Plavix and all regular morning medicines.  You may use sips of water.  5. Plan for one night stay--bring personal belongings. 6. Bring a current list of your medications and current insurance cards. 7. You MUST have a responsible person to drive you home. 8. Someone MUST be with you the first 24 hours after you arrive home or your discharge will be delayed. 9. Please wear clothes that are easy to get on and off and wear slip-on shoes.  Thank you for allowing [place] to care for you!   -- [facility]    If you need a refill on your cardiac medications before your next appointment, please call your pharmacy.      Signed, Ermalinda Barrios, PA-C  09/20/2017 3:02 PM    [facility] [place], [place], [place]  [address] Phone: [phone]; Fax: [phone]

## 2017-09-20 NOTE — Patient Instructions (Addendum)
Medication Instructions:  Your physician has recommended you make the following change in your medication:  1.  RESTART Tricor 145 mg daily   Labwork: TODAY:  BMET, CBC, & PT/INR  Testing/Procedures: Your physician has requested that you have a cardiac catheterization. Cardiac catheterization is used to diagnose and/or treat various heart conditions. Doctors may recommend this procedure for a number of different reasons. The most common reason is to evaluate chest pain. Chest pain can be a symptom of coronary artery disease (CAD), and cardiac catheterization can show whether plaque is narrowing or blocking your heart's arteries. This procedure is also used to evaluate the valves, as well as measure the blood flow and oxygen levels in different parts of your heart. For further information please visit HugeFiesta.tn. Please follow instruction sheet, as given.   Your physician has requested that you have a lower extremity arterial exercise duplex. During this test, exercise and ultrasound are used to evaluate arterial blood flow in the legs. Allow one hour for this exam. There are no restrictions or special instructions   Follow-Up: Your physician recommends that you schedule a follow-up appointment in: 2 WEEKS FROM 10/01/17 WITH DR. Angelena Form OR Ermalinda Barrios, PA-C  Your physician recommends that you schedule a follow-up appointment in: [place] (AFTER HIS LOWER EXTREMITY IS DONE)    Any Other Special Instructions Will Be Listed Below (If Applicable).   [facility] OFFICE [address], [place] [address] [place] [address] Dept: [phone] Loc: [place].  09/20/2017  You are scheduled for a Cardiac Catheterization on Friday, February 22 with Dr. [name].  1. Please arrive at the [facility] (Main Entrance A) at [facility] [address]  [place], [place] [address] at 5:30 AM (two hours before your procedure to ensure your preparation). Free valet parking service is available.   Special note: Every effort is made to have your procedure done on time. Please understand that emergencies sometimes delay scheduled procedures.  2. Diet: Do not eat or drink anything after midnight prior to your procedure except sips of water to take medications.  3. Labs:  WILL BE DRAWN TODAY  4. Medication instructions in preparation for your procedure:   On the morning of your procedure, take your Aspirin & Plavix and all regular morning medicines.  You may use sips of water.  5. Plan for one night stay--bring personal belongings. 6. Bring a current list of your medications and current insurance cards. 7. You MUST have a responsible person to drive you home. 8. Someone MUST be with you the first 24 hours after you arrive home or your discharge will be delayed. 9. Please wear clothes that are easy to get on and off and wear slip-on shoes.  Thank you for allowing [place] to care for you!   -- [place]    If you need a refill on your cardiac medications before your next appointment, please call your pharmacy.

## 2017-09-20 NOTE — Progress Notes (Signed)
Cardiology Office Note    Date:  09/20/2017   ID:  Kenneth Romney., DOB [birth year], MRN [id number]  PCP:  Colon Branch, MD  Cardiologist: [name], MD  No chief complaint on file.   History of Present Illness:  Kenneth Blizzard. is a 57 y.o. male with past medical history of CAD (s/p CABG x 3 in 10/2008 following inferolateral STEMI, occluded SVG-OM by cath in 06/2009 treated with a BMS, cath in 2014 with 3/3 patent grafts), HTN, HLD, morbid obesity, polysubstance abuse, stroke, pancreatitis, IDA, esophagitis, colon polyps, and medical noncompliance who presented to [facility] ED on 07/07/2016 as a CODE STEMI treated with DES to the SVG to the OM.  Normal LV function by LV gram.  Prior to the procedure he had V. fib and required defibrillation x1.  Had dyspnea with Brilinta and was switched to Plavix and aspirin.  He was referred to Dr. Fletcher Anon for PV workup.  Lower extremity Dopplers 09/2016 showed occluded left SFA which was treated with atherectomy, drug coated balloon angioplasty and stenting.  There was moderate disease in the right SFA.  Reviewing the chart he is canceled multiple appointments with Dr. Fletcher Anon.   I saw the patient 08/24/17 complaining of daily angina with very little activity.  Continues to smoke a pack and a half of cigarettes daily.  Not he also had severe pain in his legs when he walks.  He drinks beer daily.  2D echo 09/13/17 showed LVEF 45-50% with hypokinesis of the apical anterior septal, anterior anterior lateral and apical myocardium as well as possible hypokinesis of the basal mid inferior myocardium.  Also grade 2 DD.  Nuclear stress test EF 35% with lateral hypokinesis with a fixed medium-sized severe basal to apical inferolateral and anterior lateral perfusion defect.  There is also fixed small moderate intensity apical septal/apical perfusion defect suggestive of prior infarction without significant ischemia.  After reviewing the above tests with Dr.  Angelena [name] and the fact that patient was having daily angina cardiac catheterization was recommended to further assess.  Patient comes in today accompanied by his daughter-in-law.  He debated a long time about whether or not he should have heart cath done.  He says he has chronic angina but is not sure if it has gotten any worse.  He has had worsening dyspnea on exertion and fatigue.  He is not willing to quit smoking.  She is asking when his COPD will be treated.     Past Medical History:  Diagnosis Date  . Abnormality of gait   . Anxiety   . CAD (coronary artery disease)    a. 10/2008 Inferolateral STEMI: 3VD w/ occluded LCX (PTCA)-->CABG x 3 (LIMA->LAD, VG->OM, VG->PDA);  b. 06/2009 NSTEMI/PCI: VG->OM 100 (BMS);  c. 05/2013 native 3VD, 3/3 patent grafts. d. 06/2016: Inferior STEMI w/ occluded SVG-OM (DES placed). LIMA-LAD and SVG-PDA patent.  . Chronic bronchitis ([place])   . Chronic diastolic CHF (congestive heart failure) ([place])    a. 02/2015 Echo: EF 50-55%, distal septal, apical, inferobasal HK, mild LVH, mild MR, mildly dil LA.  [name] Cocaine use   . Colon polyps    a. 09/2015 colonscopy: multiple sessile polyps, path negative for high grade dysplasia.  . Depression   . Emphysema (subcutaneous) (surgical) resulting from a procedure 10/2008.   Bleb resected at time of 10/2008 CABG. Marketed emphysema noted on surgical report.  . Esophagitis    a. 2017 EGD: esophagitis, duodenitis.  [name] ETOH abuse   .  GERD (gastroesophageal reflux disease)   . Hemianopia, homonymous, right    "can't see out the right side of either eye since stroke in 2016/pt description 09/23/2016  . Hepatic steatosis   . Hiatal hernia   . History of nuclear stress test 08/2017   Nuclear stress test 1/19: EF 35, inf-lat and ant-lat, apical sept/apical scar; no ischemia; Intermediate Risk  . Hyperlipidemia   . Hypertension   . Iron deficiency anemia 2010  . Morbid obesity ([place])   . Myocardial infarction ([place]) "several"  .  PAD (peripheral artery disease) ([place])   . Pancreatitis 06/2015  . Sleep apnea    "don't have a mask" (09/23/2016)  . Stroke ([place]) 02/2015   a. 02/2015 Carotid U/S: 1-39% bilat ICA stenosis; "left me blind" (09/23/2016)  . Tobacco abuse    still smokes  . Tubular adenoma of colon     Past Surgical History:  Procedure Laterality Date  . ABDOMINAL AORTOGRAM W/LOWER EXTREMITY N/A 09/23/2016   Procedure: Abdominal Aortogram w/Lower Extremity;  Surgeon: Wellington Hampshire, MD;  Location: [place] [facility];  Service: Cardiovascular;  Laterality: N/A;  . CARDIAC CATHETERIZATION  11/07/2008   EF of 50-55% -- normal LV systolic function, acute inferolateral ST elevation MI,  PTCA of the circumflex artery -- Kenneth Lean. [name], [name]  . CARDIAC CATHETERIZATION N/A 07/07/2016   Procedure: Left Heart Cath and Cors/Grafts Angiography;  Surgeon: [name], MD;  Location: [place] [facility];  Service: Cardiovascular;  Laterality: N/A;  . CARDIAC CATHETERIZATION N/A 07/07/2016   Procedure: Coronary Stent Intervention;  Surgeon: [name], MD;  Location: [place] [facility];  Service: Cardiovascular;  Laterality: N/A;  . CORONARY ANGIOPLASTY    . CORONARY ARTERY BYPASS GRAFT     "CABG X4"  . LEFT HEART CATHETERIZATION WITH CORONARY/GRAFT ANGIOGRAM N/A 05/15/2013   Procedure: LEFT HEART CATHETERIZATION WITH Beatrix Fetters;  Surgeon: [name], MD;  Location: [facility];  Service: Cardiovascular;  Laterality: N/A;  . PERIPHERAL VASCULAR ATHERECTOMY Left 09/23/2016  . PERIPHERAL VASCULAR INTERVENTION  09/23/2016   Procedure: Peripheral Vascular Intervention;  Surgeon: Wellington Hampshire, MD;  Location: [place] [facility];  Service: Cardiovascular;;  Lt. SFA    Current Medications: Current Meds  Medication Sig  . aspirin 81 MG chewable tablet Chew 1 tablet (81 mg total) by mouth daily.  [name] atorvastatin (LIPITOR) 80 MG tablet Take 1 tablet (80 mg total) by mouth daily at 6 PM.  . buPROPion  (WELLBUTRIN XL) 150 MG 24 hr tablet TAKE 1 TABLET BY MOUTH DAILY.  [name] Cholecalciferol (VITAMIN D) 2000 units tablet Take 1 tablet (2,000 Units total) by mouth daily.  . clopidogrel (PLAVIX) 75 MG tablet Take 1 tablet (75 mg total) by mouth daily.  . ferrous sulfate 325 (65 FE) MG tablet Take one tablet by mouth on  Mon/Wed/Fri  . isosorbide mononitrate (IMDUR) 60 MG 24 hr tablet Take 1 tablet (60 mg total) by mouth daily.  [name] lisinopril (PRINIVIL,ZESTRIL) 5 MG tablet Take 1 tablet (5 mg total) by mouth daily.  . metoprolol tartrate (LOPRESSOR) 25 MG tablet Take 1 tablet (25 mg total) by mouth 2 (two) times daily.  [name] NITROSTAT 0.4 MG SL tablet PLACE 1 TABLET (0.4 MG TOTAL) UNDER THE TONGUE EVERY 5 MINUTES AS NEEDED FOR CHEST PAIN.  [name] pantoprazole (PROTONIX) 40 MG tablet Take 1 tablet (40 mg total) by mouth 2 (two) times daily before a meal.     Allergies:  Patient has no allergy information on record.   Social History   Socioeconomic History  . Marital status: Divorced    Spouse name: None  . Number of children: 2  . Years of education: None  . Highest education level: None  Social Needs  . Financial resource strain: None  . Food insecurity - worry: None  . Food insecurity - inability: None  . Transportation needs - medical: None  . Transportation needs - non-medical: None  Occupational History  . Occupation: not working , used to be a Chief Executive Officer   Tobacco Use  . Smoking status: Current Every Day Smoker    Packs/day: 1.00    Years: 43.00    Pack years: 43.00    Types: Cigarettes  . Smokeless tobacco: Never Used  Substance and Sexual Activity  . Alcohol use: Yes    Alcohol/week: 8.4 oz    Types: 14 Cans of beer per week    Comment: 09/23/2016 "couple beers/day"  . Drug use: No    Comment: 09/23/2016 "none in years"  . Sexual activity: Not Currently  Other Topics Concern  . None  Social History Narrative   Lives w/ girlfriend      Family History:  The patient's  family history includes Colon cancer in his father; Diabetes in his mother; Heart disease in his mother; Heart failure in his mother.   ROS:   Please see the history of present illness.    Review of Systems  Constitution: Positive for weakness and malaise/fatigue.  HENT: Negative.   Cardiovascular: Positive for chest pain, claudication and dyspnea on exertion.  Respiratory: Positive for shortness of breath.   Endocrine: Negative.   Hematologic/Lymphatic: Negative.   Musculoskeletal: Positive for back pain.  Gastrointestinal: Negative.   Genitourinary: Negative.   Psychiatric/Behavioral: Positive for depression. The patient is nervous/anxious.    All other systems reviewed and are negative.   PHYSICAL EXAM:   VS:  BP (!) 164/90   Pulse 74   Ht 5\' 10"  (1.778 m)   Wt 223 lb 12.8 oz (101.5 kg)   SpO2 96%   BMI 32.11 kg/m    Physical Exam  GEN: Obese, in no acute distress  Neck: no JVD, carotid bruits, or masses Cardiac:RRR; no murmurs, rubs, or gallops  Respiratory:  clear to auscultation bilaterally, normal work of breathing GI: soft, nontender, nondistended, + BS Ext: without cyanosis, clubbing, or edema, Good distal pulses bilaterally Neuro:  Alert and Oriented x 3 Psych: euthymic mood, full affect  Wt Readings from Last 3 Encounters:  09/20/17 223 lb 12.8 oz (101.5 kg)  08/24/17 227 lb 6.4 oz (103.1 kg)  02/26/17 228 lb 6.4 oz (103.6 kg)      Studies/Labs Reviewed:   EKG:  EKG is  ordered today.   Recent Labs: 08/24/2017: ALT 23; BUN 8; Creatinine, Ser 0.87; Hemoglobin 16.9; Platelets 181; Potassium 4.4; Sodium 136   Lipid Panel    Component Value Date/Time   CHOL 160 08/24/2017 1438   TRIG 189 (H) 08/24/2017 1438   HDL 39 (L) 08/24/2017 1438   CHOLHDL 4.1 08/24/2017 1438   CHOLHDL 4.8 07/08/2016 0532   VLDL 29 07/08/2016 0532   LDLCALC 83 08/24/2017 1438   LDLDIRECT 116.0 05/12/2013 1517    Additional studies/ records that were reviewed today include:    Nuclear stress test 08/27/17 Study Highlights      Nuclear stress EF: 35%.  There was no ST segment deviation noted during stress.  Findings consistent with  prior myocardial infarction.  This is an intermediate risk study.  The left ventricular ejection fraction is moderately decreased (30-44%).   1. EF 35%, lateral hypokinesis.  2. Fixed medium-sized, severe basal to apical inferolateral and anterolateral perfusion defect. Fixed small, moderate intensity apical septal/apical perfusion defect.  These findings are suggestive of prior infarction without significant ischemia.    Intermediate risk study due to low EF.     2D echo 09/13/17 Study Conclusions   - Left ventricle: The cavity size was normal. There was mild   concentric hypertrophy. Systolic function was mildly reduced. The   estimated ejection fraction was in the range of 45% to 50%.   Hypokinesis of the apicalanteroseptal, anterior, anterolateral,   and apical myocardium; consistent with infarction in the   distribution of the left anterior descending coronary artery.   Possible hypokinesis of the basal-midinferolateral myocardium.   Features are consistent with a pseudonormal left ventricular   filling pattern, with concomitant abnormal relaxation and   increased filling pressure (grade 2 diastolic dysfunction). - Mitral valve: Calcified annulus. There was moderate regurgitation   directed eccentrically and posteriorly. - Left atrium: The atrium was severely dilated. - Right atrium: The atrium was moderately to severely dilated.   Impressions:   - Repaet evaluation with Definity contrast would allow more   confident evaluation of LV wall motion abnormalities.   Cardiac cath 11/ 2017 Procedures   Coronary Stent Intervention  Left Heart Cath and Cors/Grafts Angiography  Conclusion      The left ventricular systolic function is normal.  LV end diastolic pressure is moderately elevated.  The left  ventricular ejection fraction is 55-65% by visual estimate.  Ost LAD to Prox LAD lesion, 95 %stenosed.  Prox LAD to Mid LAD lesion, 100 %stenosed.  Ost Cx to Mid Cx lesion, 100 %stenosed.  Prox RCA to Dist RCA lesion, 100 %stenosed.  SVG graft was visualized by angiography and is normal in caliber and anatomically normal.  LIMA graft was visualized by angiography and is normal in caliber and anatomically normal.  SVG graft was visualized by angiography and is large.  Origin to Prox Graft lesion, 100 %stenosed.  A STENT SYNERGY DES 4X20 drug eluting stent was successfully placed.  Post intervention, there is a 0% residual stenosis.   1. Severe 3 vessel obstructive CAD 2. Patent LIMA to the LAD 3. Occluded SVG to the OM. This is the culprit 4. Patent SVG to PDA 5. Good LV function 6. Elevated LVEDP 7. Successful PCI of the SVG to OM with DES   Plan: DAPT for one year. Risk factor modification.        ASSESSMENT:    1. Chest pain, unspecified type   2. Coronary artery disease of bypass graft of native heart with stable angina pectoris ([place])   3. HYPERTENSION, BENIGN ESSENTIAL   4. Chronic diastolic CHF (congestive heart failure) ([place])   5. PAD (peripheral artery disease) ([place])   6. TOBACCO ABUSE   7. Dyslipidemia      PLAN:  In order of problems listed above:  Chest pain described as daily angina with new LV dysfunction on echo and Myoview.  Intermediate risk Myoview without significant ischemia.  Reviewed with Dr. Angelena [name] who recommends repeat cardiac catheterization. I have reviewed the risks, indications, and alternatives to angioplasty and stenting with the patient. Risks include but are not limited to bleeding, infection, vascular injury, stroke, myocardial infection, arrhythmia, kidney injury, radiation-related injury in the case of prolonged fluoroscopy use,  emergency cardiac surgery, and death. The patient understands the risks of serious complication is low  (<2%) and he agrees to proceed.    CAD status post STEMI with V. fib, shocked 1 treated with DES to the SVG to the OM. Patent LIMA to the LAD, patent SVG to the PDA, normal LV function. Patient has had chronic angina since his MI but it has progressively worsened.  Imdur 60 mg daily does not seem to have helped.  He continues to smoke.  Cardiac cath as discussed above.  Essential hypertension blood pressure high today but he has not taken any of his medications.  Chronic diastolic CHF compensated without heart failure.   PAD status post atherectomy of the left SFA followed by drug-coated balloon angioplasty in 6 stenting of the mid segment 09/2016.  Dopplers in 11/2016 were stable.  He has missed 2 appointments to have it rechecked.  He has terrible claudication symptoms.  Will reorder.  Follow-up with Dr. Fletcher Anon  Tobacco abuse continues to smoke half a pack daily.  Smoking cessation has been discussed.  He did not tolerate Chantix.  He is on Wellbutrin.  Hyperlipidemia triglycerides were up at 189 LDL was 83.  Patient has not been taking his TriCor but has been taking his Lipitor.  Will resume TriCor.   Medication Adjustments/Labs and Tests Ordered: Current medicines are reviewed at length with the patient today.  Concerns regarding medicines are outlined above.  Medication changes, Labs and Tests ordered today are listed in the Patient Instructions below. Patient Instructions  Medication Instructions:  Your physician recommends that you continue on your current medications as directed. Please refer to the Current Medication list given to you today.   Labwork: TODAY:  BMET, CBC, & PT/INR  Testing/Procedures: Your physician has requested that you have a cardiac catheterization. Cardiac catheterization is used to diagnose and/or treat various heart conditions. Doctors may recommend this procedure for a number of different reasons. The most common reason is to evaluate chest pain. Chest pain  can be a symptom of coronary artery disease (CAD), and cardiac catheterization can show whether plaque is narrowing or blocking your heart's arteries. This procedure is also used to evaluate the valves, as well as measure the blood flow and oxygen levels in different parts of your heart. For further information please visit [email or web address]. Please follow instruction sheet, as given.   Your physician has requested that you have a lower extremity arterial exercise duplex. During this test, exercise and ultrasound are used to evaluate arterial blood flow in the legs. Allow one hour for this exam. There are no restrictions or special instructions   Follow-Up: Your physician recommends that you schedule a follow-up appointment in: 2 WEEKS FROM 10/01/17 WITH DR. Angelena [name] OR Kenneth Barrios, PA-C  Your physician recommends that you schedule a follow-up appointment in: [place] (AFTER HIS LOWER EXTREMITY IS DONE)    Any Other Special Instructions Will Be Listed Below (If Applicable).   [place] OFFICE [address], [place] [place] [address] Dept: [phone] Loc: [place].  09/20/2017  You are scheduled for a Cardiac Catheterization on Friday, February 22 with Dr. [name].  1. Please arrive at the [facility] (Main Entrance A) at [facility]: [address] [place], [place] [address] at 5:30 AM (two hours before your procedure to ensure your preparation). Free valet parking service is available.  Special note: Every effort is made to have your procedure done on time. Please understand that emergencies sometimes delay scheduled procedures.  2. Diet: Do not eat or drink anything after midnight prior to your procedure except sips of water to take medications.  3. Labs:  WILL BE DRAWN TODAY  4. Medication instructions in preparation for your  procedure:   On the morning of your procedure, take your Aspirin & Plavix and all regular morning medicines.  You may use sips of water.  5. Plan for one night stay--bring personal belongings. 6. Bring a current list of your medications and current insurance cards. 7. You MUST have a responsible person to drive you home. 8. Someone MUST be with you the first 24 hours after you arrive home or your discharge will be delayed. 9. Please wear clothes that are easy to get on and off and wear slip-on shoes.  Thank you for allowing [place] to care for you!   -- [facility]    If you need a refill on your cardiac medications before your next appointment, please call your pharmacy.      Signed, Kenneth Barrios, PA-C  09/20/2017 3:02 PM    [facility] [place], [place], [place]  [address] Phone: [phone]; Fax: [phone]

## 2017-09-20 NOTE — Progress Notes (Signed)
cbc

## 2017-09-21 LAB — BASIC METABOLIC PANEL
BUN/Creatinine Ratio: 11 (ref 9–20)
BUN: 10 mg/dL (ref 6–24)
CO2: 22 mmol/L (ref 20–29)
Calcium: 8.8 mg/dL (ref 8.7–10.2)
Chloride: 97 mmol/L (ref 96–106)
Creatinine, Ser: 0.89 mg/dL (ref 0.76–1.27)
GFR calc Af Amer: 112 mL/min/{1.73_m2} (ref >59)
GFR, EST NON AFRICAN AMERICAN: 97 mL/min/{1.73_m2} (ref >59)
GLUCOSE: 93 mg/dL (ref 65–99)
POTASSIUM: 4.5 mmol/L (ref 3.5–5.2)
SODIUM: 138 mmol/L (ref 134–144)

## 2017-09-21 LAB — PROTIME-INR
INR: 1.1 (ref 0.8–1.2)
Prothrombin Time: 10.9 s (ref 9.1–12.0)

## 2017-09-21 LAB — CBC
Hematocrit: 48.7 % (ref 37.5–51.0)
Hemoglobin: 17.4 g/dL (ref 13.0–17.7)
MCH: 34.5 pg — AB (ref 26.6–33.0)
MCHC: 35.7 g/dL (ref 31.5–35.7)
MCV: 97 fL (ref 79–97)
PLATELETS: 146 10*3/uL — AB (ref 150–379)
RBC: 5.04 x10E6/uL (ref 4.14–5.80)
RDW: 15.5 % — ABNORMAL HIGH (ref 12.3–15.4)
WBC: 5.3 10*3/uL (ref 3.4–10.8)

## 2017-09-30 ENCOUNTER — Telehealth: Payer: Self-pay | Admitting: *Deleted

## 2017-09-30 ENCOUNTER — Ambulatory Visit (HOSPITAL_COMMUNITY): Payer: Medicare HMO

## 2017-09-30 NOTE — Telephone Encounter (Signed)
Pt contacted pre-catheterization scheduled at [facility] for: Friday February 22,2019 7:30 AM Verified arrival time and place: [place] Entrance A/[place] at: 5:30 AM Nothing to eat or drink after midnight the night before the cath. Verified no known allergies. Verified no diabetes medications.   AM meds can be  taken pre-cath with sip of water including: ASA 81 mg am of cath Plavix/clopidgrel am of cath.    Confirmed patient has responsible person to drive home post procedure and observe patient for 24 hours: yes

## 2017-10-01 ENCOUNTER — Ambulatory Visit (HOSPITAL_COMMUNITY)
Admission: RE | Admit: 2017-10-01 | Discharge: 2017-10-01 | Disposition: A | Discharge status: Home / Self Care | Payer: Medicare HMO | Source: Ambulatory Visit | Attending: Cardiovascular Disease | Admitting: Cardiovascular Disease

## 2017-10-01 ENCOUNTER — Encounter (HOSPITAL_COMMUNITY)
Admission: RE | Disposition: A | Discharge status: Home / Self Care | Payer: Self-pay | Source: Ambulatory Visit | Attending: Cardiovascular Disease

## 2017-10-01 ENCOUNTER — Encounter (HOSPITAL_COMMUNITY): Payer: Self-pay | Admitting: Cardiovascular Disease

## 2017-10-01 DIAGNOSIS — K219 Gastro-esophageal reflux disease without esophagitis: Secondary | ICD-10-CM | POA: Diagnosis not present

## 2017-10-01 DIAGNOSIS — D509 Iron deficiency anemia, unspecified: Secondary | ICD-10-CM | POA: Insufficient documentation

## 2017-10-01 DIAGNOSIS — I6523 Occlusion and stenosis of bilateral carotid arteries: Secondary | ICD-10-CM | POA: Insufficient documentation

## 2017-10-01 DIAGNOSIS — K760 Fatty (change of) liver, not elsewhere classified: Secondary | ICD-10-CM | POA: Diagnosis not present

## 2017-10-01 DIAGNOSIS — R079 Chest pain, unspecified: Secondary | ICD-10-CM

## 2017-10-01 DIAGNOSIS — I5032 Chronic diastolic (congestive) heart failure: Secondary | ICD-10-CM | POA: Diagnosis not present

## 2017-10-01 DIAGNOSIS — Z8249 Family history of ischemic heart disease and other diseases of the circulatory system: Secondary | ICD-10-CM | POA: Insufficient documentation

## 2017-10-01 DIAGNOSIS — K449 Diaphragmatic hernia without obstruction or gangrene: Secondary | ICD-10-CM | POA: Insufficient documentation

## 2017-10-01 DIAGNOSIS — Z7902 Long term (current) use of antithrombotics/antiplatelets: Secondary | ICD-10-CM | POA: Diagnosis not present

## 2017-10-01 DIAGNOSIS — I25119 Atherosclerotic heart disease of native coronary artery with unspecified angina pectoris: Secondary | ICD-10-CM

## 2017-10-01 DIAGNOSIS — Z955 Presence of coronary angioplasty implant and graft: Secondary | ICD-10-CM | POA: Insufficient documentation

## 2017-10-01 DIAGNOSIS — E785 Hyperlipidemia, unspecified: Secondary | ICD-10-CM | POA: Insufficient documentation

## 2017-10-01 DIAGNOSIS — Z809 Family history of malignant neoplasm, unspecified: Secondary | ICD-10-CM | POA: Insufficient documentation

## 2017-10-01 DIAGNOSIS — Z7982 Long term (current) use of aspirin: Secondary | ICD-10-CM | POA: Diagnosis not present

## 2017-10-01 DIAGNOSIS — I25708 Atherosclerosis of coronary artery bypass graft(s), unspecified, with other forms of angina pectoris: Secondary | ICD-10-CM | POA: Diagnosis not present

## 2017-10-01 DIAGNOSIS — Z951 Presence of aortocoronary bypass graft: Secondary | ICD-10-CM | POA: Insufficient documentation

## 2017-10-01 DIAGNOSIS — Z6832 Body mass index (BMI) 32.0-32.9, adult: Secondary | ICD-10-CM | POA: Diagnosis not present

## 2017-10-01 DIAGNOSIS — G4730 Sleep apnea, unspecified: Secondary | ICD-10-CM | POA: Diagnosis not present

## 2017-10-01 DIAGNOSIS — I739 Peripheral vascular disease, unspecified: Secondary | ICD-10-CM | POA: Insufficient documentation

## 2017-10-01 DIAGNOSIS — I252 Old myocardial infarction: Secondary | ICD-10-CM | POA: Insufficient documentation

## 2017-10-01 DIAGNOSIS — J439 Emphysema, unspecified: Secondary | ICD-10-CM | POA: Insufficient documentation

## 2017-10-01 DIAGNOSIS — F17210 Nicotine dependence, cigarettes, uncomplicated: Secondary | ICD-10-CM | POA: Insufficient documentation

## 2017-10-01 DIAGNOSIS — F329 Major depressive disorder, single episode, unspecified: Secondary | ICD-10-CM | POA: Diagnosis not present

## 2017-10-01 DIAGNOSIS — Z833 Family history of diabetes mellitus: Secondary | ICD-10-CM | POA: Insufficient documentation

## 2017-10-01 DIAGNOSIS — Z8673 Personal history of transient ischemic attack (TIA), and cerebral infarction without residual deficits: Secondary | ICD-10-CM | POA: Insufficient documentation

## 2017-10-01 DIAGNOSIS — Z800 Family history of malignant neoplasm of digestive organs: Secondary | ICD-10-CM | POA: Insufficient documentation

## 2017-10-01 DIAGNOSIS — Z9119 Patient's noncompliance with other medical treatment and regimen: Secondary | ICD-10-CM | POA: Insufficient documentation

## 2017-10-01 DIAGNOSIS — I110 Hypertensive heart disease with heart failure: Secondary | ICD-10-CM | POA: Insufficient documentation

## 2017-10-01 DIAGNOSIS — Z86010 Personal history of colonic polyps: Secondary | ICD-10-CM | POA: Diagnosis not present

## 2017-10-01 DIAGNOSIS — Z79899 Other long term (current) drug therapy: Secondary | ICD-10-CM | POA: Diagnosis not present

## 2017-10-01 DIAGNOSIS — F419 Anxiety disorder, unspecified: Secondary | ICD-10-CM | POA: Insufficient documentation

## 2017-10-01 HISTORY — PX: LEFT HEART CATH AND CORS/GRAFTS ANGIOGRAPHY: CATH118250

## 2017-10-01 SURGERY — LEFT HEART CATH AND CORS/GRAFTS ANGIOGRAPHY
Anesthesia: LOCAL

## 2017-10-01 MED ORDER — SODIUM CHLORIDE 0.9% FLUSH: 3.0000 mL | INTRAVENOUS | Status: DC | PRN

## 2017-10-01 MED ORDER — ASPIRIN 81 MG PO CHEW: 81.0000 mg | CHEWABLE_TABLET | ORAL | Status: DC

## 2017-10-01 MED ORDER — SODIUM CHLORIDE 0.9 % IV SOLN: 250.0000 mL | INTRAVENOUS | Status: DC | PRN

## 2017-10-01 MED ORDER — IOPAMIDOL (ISOVUE-370) INJECTION 76%
INTRAVENOUS | Status: DC | PRN
  Administered 2017-10-01: 90 mL

## 2017-10-01 MED ORDER — HEPARIN (PORCINE) IN NACL 2-0.9 UNIT/ML-% IJ SOLN
INTRAMUSCULAR | Status: AC | PRN
  Administered 2017-10-01 (×2): 500 mL via INTRA_ARTERIAL

## 2017-10-01 MED ORDER — SODIUM CHLORIDE 0.9 % IV SOLN: INTRAVENOUS | Status: AC

## 2017-10-01 MED ORDER — MIDAZOLAM HCL 2 MG/2ML IJ SOLN
INTRAMUSCULAR | Status: AC
  Filled 2017-10-01: qty 2

## 2017-10-01 MED ORDER — LIDOCAINE HCL (PF) 1 % IJ SOLN
INTRAMUSCULAR | Status: DC | PRN
  Administered 2017-10-01: 15 mL

## 2017-10-01 MED ORDER — SODIUM CHLORIDE 0.9 % WEIGHT BASED INFUSION
3.0000 mL/kg/h | INTRAVENOUS | Status: AC
  Administered 2017-10-01: 3 mL/kg/h via INTRAVENOUS

## 2017-10-01 MED ORDER — FENTANYL CITRATE (PF) 100 MCG/2ML IJ SOLN
INTRAMUSCULAR | Status: DC | PRN
  Administered 2017-10-01: 50 ug via INTRAVENOUS

## 2017-10-01 MED ORDER — HEPARIN (PORCINE) IN NACL 2-0.9 UNIT/ML-% IJ SOLN
INTRAMUSCULAR | Status: AC
  Filled 2017-10-01: qty 1000

## 2017-10-01 MED ORDER — SODIUM CHLORIDE 0.9% FLUSH: 3.0000 mL | Freq: Two times a day (BID) | INTRAVENOUS | Status: DC

## 2017-10-01 MED ORDER — IOPAMIDOL (ISOVUE-370) INJECTION 76%
INTRAVENOUS | Status: AC
  Filled 2017-10-01: qty 150

## 2017-10-01 MED ORDER — FENTANYL CITRATE (PF) 100 MCG/2ML IJ SOLN
INTRAMUSCULAR | Status: AC
  Filled 2017-10-01: qty 2

## 2017-10-01 MED ORDER — MIDAZOLAM HCL 2 MG/2ML IJ SOLN
INTRAMUSCULAR | Status: DC | PRN
  Administered 2017-10-01: 2 mg via INTRAVENOUS

## 2017-10-01 MED ORDER — SODIUM CHLORIDE 0.9 % WEIGHT BASED INFUSION: 1.0000 mL/kg/h | INTRAVENOUS | Status: DC

## 2017-10-01 MED ORDER — LIDOCAINE HCL (PF) 1 % IJ SOLN
INTRAMUSCULAR | Status: AC
  Filled 2017-10-01: qty 30

## 2017-10-01 SURGICAL SUPPLY — 13 items
CATH INFINITI 5 FR IM (CATHETERS) ×2 IMPLANT
CATH INFINITI 5 FR RCB (CATHETERS) ×2 IMPLANT
CATH INFINITI 5FR AL1 (CATHETERS) ×2 IMPLANT
CATH INFINITI 5FR MULTPACK ANG (CATHETERS) ×2 IMPLANT
COVER PRB 48X5XTLSCP FOLD TPE (BAG) ×1 IMPLANT
COVER PROBE 5X48 (BAG) ×2
GUIDEWIRE ANGLED .035X150CM (WIRE) ×2 IMPLANT
KIT HEART LEFT (KITS) ×2 IMPLANT
PACK CARDIAC CATHETERIZATION (CUSTOM PROCEDURE TRAY) ×2 IMPLANT
SHEATH PINNACLE 5F 10CM (SHEATH) ×2 IMPLANT
TRANSDUCER W/STOPCOCK (MISCELLANEOUS) ×2 IMPLANT
TUBING CIL FLEX 10 FLL-RA (TUBING) ×2 IMPLANT
WIRE EMERALD 3MM-J .035X150CM (WIRE) ×2 IMPLANT

## 2017-10-01 NOTE — Discharge Instructions (Signed)

## 2017-10-01 NOTE — Progress Notes (Addendum)
Rt femoral arterial sheath removed and pressure held by [employer]. Rt groin level 0 pre sheath pull, rt dp palpable, manual pressure held for 20 minutes. Rt groin level 0 post sheath pull, rt dp palpable, bed rest starts at 0845. Instructions reviewed with patient.

## 2017-10-01 NOTE — Interval H&P Note (Signed)
History and Physical Interval Note:  10/01/2017 7:22 AM  [name].  has presented today for cardiac cath with the diagnosis of unstable angina.  The various methods of treatment have been discussed with the patient and family. After consideration of risks, benefits and other options for treatment, the patient has consented to  Procedure(s): LEFT HEART CATH AND CORS/GRAFTS ANGIOGRAPHY (N/A) as a surgical intervention .  The patient's history has been reviewed, patient examined, no change in status, stable for surgery.  I have reviewed the patient's chart and labs.  Questions were answered to the patient's satisfaction.    Cath Lab Visit (complete for each Cath Lab visit)  Clinical Evaluation Leading to the Procedure:   ACS: No.  Non-ACS:    Anginal Classification: CCS III  Anti-ischemic medical therapy: Maximal Therapy (2 or more classes of medications)  Non-Invasive Test Results: Intermediate-risk stress test findings: cardiac mortality 1-3%/year  Prior CABG: Previous CABG         [name]

## 2017-10-04 MED FILL — Heparin Sodium (Porcine) 2 Unit/ML in Sodium Chloride 0.9%: INTRAMUSCULAR | Qty: 1000 | Status: AC

## 2017-10-06 ENCOUNTER — Other Ambulatory Visit: Payer: Self-pay | Admitting: Cardiovascular Disease

## 2017-10-06 DIAGNOSIS — I739 Peripheral vascular disease, unspecified: Secondary | ICD-10-CM

## 2017-10-07 ENCOUNTER — Telehealth: Payer: Self-pay | Admitting: Cardiovascular Disease

## 2017-10-07 NOTE — Telephone Encounter (Signed)
New Message   Pt wants to know why he is having an ultrasound on his leg when Arida told him the problem was in his back. Please call

## 2017-10-07 NOTE — Telephone Encounter (Signed)
Spoke with pt to make him aware the the test is being done to evaluate the blood flow in his legs. Pt had an angiogram last year and this is a regular scheduled f/u post procedure. Pt stated he has pain in his back, educated that this is not testing for the back pain. Pt reminded that the test is tomorrow 3/1 at our office at 10am and to arrive 15 minutes before. Pt verbalized understanding no additional questions at this time.

## 2017-10-08 ENCOUNTER — Other Ambulatory Visit: Payer: Self-pay

## 2017-10-08 ENCOUNTER — Ambulatory Visit (HOSPITAL_COMMUNITY)
Admission: RE | Admit: 2017-10-08 | Discharge: 2017-10-08 | Disposition: A | Discharge status: Home / Self Care | Payer: Medicare HMO | Source: Ambulatory Visit | Attending: Cardiology | Admitting: Cardiology

## 2017-10-08 DIAGNOSIS — I739 Peripheral vascular disease, unspecified: Secondary | ICD-10-CM | POA: Diagnosis not present

## 2017-10-08 MED ORDER — ISOSORBIDE MONONITRATE ER 60 MG PO TB24: 60.0000 mg | ORAL_TABLET | Freq: Every day | ORAL | 11 refills | Status: DC

## 2017-10-08 MED ORDER — METOPROLOL TARTRATE 25 MG PO TABS: 25.0000 mg | ORAL_TABLET | Freq: Two times a day (BID) | ORAL | 11 refills | Status: DC

## 2017-10-08 MED ORDER — LISINOPRIL 5 MG PO TABS: 5.0000 mg | ORAL_TABLET | Freq: Every day | ORAL | 3 refills | Status: DC

## 2017-10-08 MED ORDER — PANTOPRAZOLE SODIUM 40 MG PO TBEC: 40.0000 mg | DELAYED_RELEASE_TABLET | Freq: Two times a day (BID) | ORAL | 11 refills | Status: DC

## 2017-10-08 MED ORDER — ATORVASTATIN CALCIUM 80 MG PO TABS: 80.0000 mg | ORAL_TABLET | Freq: Every day | ORAL | 11 refills | Status: DC

## 2017-10-12 ENCOUNTER — Telehealth: Payer: Self-pay | Admitting: *Deleted

## 2017-10-12 DIAGNOSIS — I739 Peripheral vascular disease, unspecified: Secondary | ICD-10-CM

## 2017-10-12 NOTE — Telephone Encounter (Signed)
Patient has been made aware of the results and verbalized his understanding. Repeat studies have been ordered.

## 2017-10-12 NOTE — Telephone Encounter (Signed)
-----   Message from Wellington Hampshire, MD sent at 10/11/2017  5:46 PM EST ----- Inform patient that ABI was normal on both sides and left SFA stent is patent.  Repeat same studies in 1 year.

## 2017-10-18 ENCOUNTER — Other Ambulatory Visit: Payer: Self-pay | Admitting: Cardiovascular Disease

## 2017-10-18 MED ORDER — PANTOPRAZOLE SODIUM 40 MG PO TBEC: 40.0000 mg | DELAYED_RELEASE_TABLET | Freq: Two times a day (BID) | ORAL | 3 refills | Status: DC

## 2017-10-18 MED ORDER — CLOPIDOGREL BISULFATE 75 MG PO TABS: 75.0000 mg | ORAL_TABLET | Freq: Every day | ORAL | 3 refills | Status: DC

## 2017-10-18 MED ORDER — FENOFIBRATE 145 MG PO TABS: 145.0000 mg | ORAL_TABLET | Freq: Every day | ORAL | 3 refills | Status: DC

## 2017-10-18 MED ORDER — ATORVASTATIN CALCIUM 80 MG PO TABS: 80.0000 mg | ORAL_TABLET | Freq: Every day | ORAL | 3 refills | Status: DC

## 2017-10-20 ENCOUNTER — Ambulatory Visit: Payer: Medicaid Other | Admitting: Cardiovascular Disease

## 2017-10-26 ENCOUNTER — Ambulatory Visit: Payer: Medicare HMO | Admitting: Cardiovascular Disease

## 2017-11-09 ENCOUNTER — Ambulatory Visit: Payer: Medicare HMO | Admitting: Physician Assistant

## 2017-11-09 ENCOUNTER — Ambulatory Visit: Payer: Medicaid Other | Admitting: Cardiovascular Disease

## 2017-12-06 ENCOUNTER — Ambulatory Visit (INDEPENDENT_AMBULATORY_CARE_PROVIDER_SITE_OTHER): Payer: Medicare HMO | Admitting: Internal Medicine

## 2017-12-06 ENCOUNTER — Encounter: Payer: Self-pay | Admitting: Internal Medicine

## 2017-12-06 VITALS — BP 134/80 | HR 61 | Temp 98.1°F | Resp 16 | Ht 70.0 in | Wt 223.2 lb

## 2017-12-06 DIAGNOSIS — I2510 Atherosclerotic heart disease of native coronary artery without angina pectoris: Secondary | ICD-10-CM | POA: Diagnosis not present

## 2017-12-06 DIAGNOSIS — L409 Psoriasis, unspecified: Secondary | ICD-10-CM

## 2017-12-06 LAB — BASIC METABOLIC PANEL
BUN: 15 mg/dL (ref 6–23)
CO2: 31 mEq/L (ref 19–32)
CREATININE: 0.9 mg/dL (ref 0.40–1.50)
Calcium: 9.4 mg/dL (ref 8.4–10.5)
Chloride: 98 mEq/L (ref 96–112)
GFR: 93.05 mL/min (ref >60.00)
Glucose, Bld: 98 mg/dL (ref 70–99)
POTASSIUM: 3.9 meq/L (ref 3.5–5.1)
Sodium: 135 mEq/L (ref 135–145)

## 2017-12-06 NOTE — Progress Notes (Signed)
Subjective:    Patient ID: [name]., male    DOB: 03-15-1961, 57 y.o.   MRN: [id number]  DOS:  12/06/2017 Type of visit - description : Acute, here for rash, last visit 2017 Interval history: His main concern today is a rash behind the ears, forehead and scalp.  This is going on for a while, + itching.  Review of Systems No fever chills No GERD symptoms No recent chest pain  Past Medical History:  Diagnosis Date  . Abnormality of gait   . Anxiety   . CAD (coronary artery disease)    a. 10/2008 Inferolateral STEMI: 3VD w/ occluded LCX (PTCA)-->CABG x 3 (LIMA->LAD, VG->OM, VG->PDA);  b. 06/2009 NSTEMI/PCI: VG->OM 100 (BMS);  c. 05/2013 native 3VD, 3/3 patent grafts. d. 06/2016: Inferior STEMI w/ occluded SVG-OM (DES placed). LIMA-LAD and SVG-PDA patent.  . Chronic bronchitis ([place])   . Chronic diastolic CHF (congestive heart failure) ([place])    a. 02/2015 Echo: EF 50-55%, distal septal, apical, inferobasal HK, mild LVH, mild MR, mildly dil LA.  [name] Cocaine use   . Colon polyps    a. 09/2015 colonscopy: multiple sessile polyps, path negative for high grade dysplasia.  . Depression   . Emphysema (subcutaneous) (surgical) resulting from a procedure 10/2008.   Bleb resected at time of 10/2008 CABG. Marketed emphysema noted on surgical report.  . Esophagitis    a. 2017 EGD: esophagitis, duodenitis.  [name] ETOH abuse   . GERD (gastroesophageal reflux disease)   . Hemianopia, homonymous, right    "can't see out the right side of either eye since stroke in 2016/pt description 09/23/2016  . Hepatic steatosis   . Hiatal hernia   . History of nuclear stress test 08/2017   Nuclear stress test 1/19: EF 35, inf-lat and ant-lat, apical sept/apical scar; no ischemia; Intermediate Risk  . Hyperlipidemia   . Hypertension   . Iron deficiency anemia 2010  . Morbid obesity ([place])   . Myocardial infarction ([place]) "several"  . PAD (peripheral artery disease) ([place])   . Pancreatitis 06/2015  . Sleep  apnea    "don't have a mask" (09/23/2016)  . Stroke ([place]) 02/2015   a. 02/2015 Carotid U/S: 1-39% bilat ICA stenosis; "left me blind" (09/23/2016)  . Tobacco abuse    still smokes  . Tubular adenoma of colon     Past Surgical History:  Procedure Laterality Date  . ABDOMINAL AORTOGRAM W/LOWER EXTREMITY N/A 09/23/2016   Procedure: Abdominal Aortogram w/Lower Extremity;  Surgeon: Wellington Hampshire, MD;  Location: [facility];  Service: Cardiovascular;  Laterality: N/A;  . CARDIAC CATHETERIZATION  11/07/2008   EF of 50-55% -- normal LV systolic function, acute inferolateral ST elevation MI,  PTCA of the circumflex artery -- [name]. [name], [name]  . CARDIAC CATHETERIZATION N/A 07/07/2016   Procedure: Left Heart Cath and Cors/Grafts Angiography;  Surgeon: [name], MD;  Location: [facility];  Service: Cardiovascular;  Laterality: N/A;  . CARDIAC CATHETERIZATION N/A 07/07/2016   Procedure: Coronary Stent Intervention;  Surgeon: [name], MD;  Location: [facility];  Service: Cardiovascular;  Laterality: N/A;  . CORONARY ANGIOPLASTY    . CORONARY ARTERY BYPASS GRAFT     "CABG X4"  . LEFT HEART CATH AND CORS/GRAFTS ANGIOGRAPHY N/A 10/01/2017   Procedure: LEFT HEART CATH AND CORS/GRAFTS ANGIOGRAPHY;  Surgeon: Burnell Blanks, MD;  Location: [facility];  Service: Cardiovascular;  Laterality: N/A;  . LEFT HEART  CATHETERIZATION WITH CORONARY/GRAFT ANGIOGRAM N/A 05/15/2013   Procedure: LEFT HEART CATHETERIZATION WITH Beatrix Fetters;  Surgeon: [name], MD;  Location: [facility];  Service: Cardiovascular;  Laterality: N/A;  . PERIPHERAL VASCULAR ATHERECTOMY Left 09/23/2016  . PERIPHERAL VASCULAR INTERVENTION  09/23/2016   Procedure: Peripheral Vascular Intervention;  Surgeon: Wellington Hampshire, MD;  Location: [place] [facility];  Service: Cardiovascular;;  Aundra Millet SFA    Social History   Socioeconomic History  . Marital status: Divorced     Spouse name: Not on file  . Number of children: 2  . Years of education: Not on file  . Highest education level: Not on file  Occupational History  . Occupation: not working , used to be a Environmental consultant  . Financial resource strain: Not on file  . Food insecurity:    Worry: Not on file    Inability: Not on file  . Transportation needs:    Medical: Not on file    Non-medical: Not on file  Tobacco Use  . Smoking status: Current Every Day Smoker    Packs/day: 1.00    Years: 43.00    Pack years: 43.00    Types: Cigarettes  . Smokeless tobacco: Never Used  Substance and Sexual Activity  . Alcohol use: Yes    Alcohol/week: 8.4 oz    Types: 14 Cans of beer per week    Comment: 09/23/2016 "couple beers/day"  . Drug use: No    Comment: 09/23/2016 "none in years"  . Sexual activity: Not Currently  Lifestyle  . Physical activity:    Days per week: Not on file    Minutes per session: Not on file  . Stress: Not on file  Relationships  . Social connections:    Talks on phone: Not on file    Gets together: Not on file    Attends religious service: Not on file    Active member of club or organization: Not on file    Attends meetings of clubs or organizations: Not on file    Relationship status: Not on file  . Intimate partner violence:    Fear of current or ex partner: Not on file    Emotionally abused: Not on file    Physically abused: Not on file    Forced sexual activity: Not on file  Other Topics Concern  . Not on file  Social History Narrative   Lives w/ girlfriend       Allergies as of 12/06/2017   No Known Allergies     Medication List        Accurate as of 12/06/17 11:59 PM. Always use your most recent med list.          aspirin 81 MG chewable tablet Chew 1 tablet (81 mg total) by mouth daily.   atorvastatin 80 MG tablet Commonly known as:  LIPITOR Take 1 tablet (80 mg total) by mouth daily at 6 PM.   cholecalciferol 1000 units  tablet Commonly known as:  VITAMIN D Take 1,000 Units by mouth daily.   clopidogrel 75 MG tablet Commonly known as:  PLAVIX Take 1 tablet (75 mg total) by mouth daily.   fenofibrate 145 MG tablet Commonly known as:  TRICOR Take 1 tablet (145 mg total) by mouth daily.   isosorbide mononitrate 60 MG 24 hr tablet Commonly known as:  IMDUR Take 1 tablet (60 mg total) by mouth daily.   lisinopril 5 MG tablet Commonly known as:  PRINIVIL,ZESTRIL Take 1 tablet (5 mg total) by mouth daily.   metoprolol tartrate 25 MG tablet Commonly known as:  LOPRESSOR Take 1 tablet (25 mg total) by mouth 2 (two) times daily.   NITROSTAT 0.4 MG SL tablet Generic drug:  nitroGLYCERIN PLACE 1 TABLET (0.4 MG TOTAL) UNDER THE TONGUE EVERY 5 MINUTES AS NEEDED FOR CHEST PAIN.   pantoprazole 40 MG tablet Commonly known as:  PROTONIX Take 1 tablet (40 mg total) by mouth 2 (two) times daily before a meal.          Objective:   Physical Exam BP 134/80 (BP Location: Left Arm, Patient Position: Sitting, Cuff Size: Normal)   Pulse 61   Temp 98.1 F (36.7 C) (Oral)   Resp 16   Ht 5\' 10"  (1.778 m)   Wt 223 lb 4 oz (101.3 kg)   SpO2 98%   BMI 32.03 kg/m  General:   Well developed, overweight appearing. NAD.  HEENT:  Normocephalic . Face symmetric, atraumatic Lungs:  CTA B Normal respiratory effort, no intercostal retractions, no accessory muscle use. Heart: RRR,  no murmur.  No pretibial edema bilaterally  Skin: Macular erythema behind the ears, forehead, scalp and  back. A couple of areas behind the ear and on the back seem to have silver scales. He has extensive scarring at the skin of the back, see pictures.   Neurologic:  alert & oriented X3.  Speech normal, gait appropriate for age and unassisted Psych--  Cognition and judgment appear intact.  Cooperative with normal attention span and concentration.  Behavior appropriate. No anxious or depressed appearing.              Assessment & Plan:     Assessment >  Prediabetes  HTN CV: --CAD: 2010 angioplasty f/u by  CABG 2010, cath 2014 --Stroke 2008, 02-2015 Anxiety depression Snoring, suspect OSA, saw  Pulmonary 2014, no sleep study Abnormal gait GI: Iron def anemia dx 03-2015  ---cscope 09-2015 multiple polyps, repeat in one year ---EGD-2017: Esophagitis, duodenitis, bx done Pancreatitis 06/2015.(EtOH 2 days prior to onset of symptoms) H/o etoh abuse H/o cocaine  Smoker  +FH: father prostate ca , dx late 47s  Plan: Rash: 1 year history of rash behind the ears at the forehead, in the back he has severe scarring, suspect this is a chronic condition, probably psoriasis.  Refer to dermatology CAD: Had a cardiac catheterization 09-2017, check a BMP. Was here  for an acute problem, recommend to come back for long office visit, last seen 2017.

## 2017-12-06 NOTE — Patient Instructions (Signed)
GO TO THE LAB : Get the blood work     GO TO THE FRONT DESK Schedule your next appointment for a checkup at your earliest convenience.  Ask the scheduler to give you a long appointment   We are referring you to dermatology, do not use any creams for now

## 2017-12-06 NOTE — Progress Notes (Signed)
Pre visit review using our clinic review tool, if applicable. No additional management support is needed unless otherwise documented below in the visit note. 

## 2017-12-07 NOTE — Assessment & Plan Note (Signed)
Rash: 1 year history of rash behind the ears at the forehead, in the back he has severe scarring, suspect this is a chronic condition, probably psoriasis.  Refer to dermatology CAD: Had a cardiac catheterization 09-2017, check a BMP. Was here  for an acute problem, recommend to come back for long office visit, last seen 2017.

## 2017-12-08 ENCOUNTER — Telehealth: Payer: Self-pay | Admitting: Internal Medicine

## 2017-12-08 MED ORDER — BETAMETHASONE DIPROPIONATE AUG 0.05 % EX CREA: TOPICAL_CREAM | Freq: Two times a day (BID) | CUTANEOUS | 0 refills | Status: DC

## 2017-12-08 NOTE — Telephone Encounter (Signed)
Copied from [place] [phone] [id number]. Topic: Quick Communication - See Telephone Encounter >> Dec 08, 2017 12:14 PM Rutherford Nail, NT wrote: CRM for notification. See Telephone encounter for: 12/08/17. Patient states dermatology called him to schedule the referral, but they could only get him in 2 months from now. Patient states that he would like to be seen sooner than that, if there is another dermatologist that could get him in sooner. States he would also like some topical cream to use in the mean time. States he has a rash behind his ear and he really wants a hair cut but is too embarrassed. CB#: [phone]

## 2017-12-08 NOTE — Telephone Encounter (Signed)
Prescription for diprolene sent to the pharmacy, to be used only behind the ear.

## 2017-12-08 NOTE — Telephone Encounter (Signed)
LMOM informing him that recommendations to schedule appt w/ dermatology group that called, most derm groups are 2-3 months out for NP appts. Informed him in the meantime PCP did send in cream to pharmacy- instructed to only use behind the ears. Instructed to call if questions/concerns.

## 2017-12-08 NOTE — Telephone Encounter (Signed)
Please advise 

## 2018-01-05 ENCOUNTER — Ambulatory Visit (INDEPENDENT_AMBULATORY_CARE_PROVIDER_SITE_OTHER): Payer: Medicare HMO | Admitting: Internal Medicine

## 2018-01-05 ENCOUNTER — Ambulatory Visit (HOSPITAL_BASED_OUTPATIENT_CLINIC_OR_DEPARTMENT_OTHER)
Admission: RE | Admit: 2018-01-05 | Discharge: 2018-01-05 | Disposition: A | Discharge status: Home / Self Care | Payer: Medicare HMO | Source: Ambulatory Visit | Attending: Internal Medicine | Admitting: Internal Medicine

## 2018-01-05 VITALS — BP 122/84 | HR 72 | Temp 98.2°F | Resp 16 | Ht 70.0 in | Wt 221.4 lb

## 2018-01-05 DIAGNOSIS — R5382 Chronic fatigue, unspecified: Secondary | ICD-10-CM

## 2018-01-05 DIAGNOSIS — F17200 Nicotine dependence, unspecified, uncomplicated: Secondary | ICD-10-CM | POA: Insufficient documentation

## 2018-01-05 DIAGNOSIS — R399 Unspecified symptoms and signs involving the genitourinary system: Secondary | ICD-10-CM | POA: Diagnosis not present

## 2018-01-05 DIAGNOSIS — G4733 Obstructive sleep apnea (adult) (pediatric): Secondary | ICD-10-CM | POA: Diagnosis not present

## 2018-01-05 DIAGNOSIS — J449 Chronic obstructive pulmonary disease, unspecified: Secondary | ICD-10-CM

## 2018-01-05 DIAGNOSIS — I517 Cardiomegaly: Secondary | ICD-10-CM | POA: Diagnosis not present

## 2018-01-05 DIAGNOSIS — I25810 Atherosclerosis of coronary artery bypass graft(s) without angina pectoris: Secondary | ICD-10-CM

## 2018-01-05 DIAGNOSIS — R05 Cough: Secondary | ICD-10-CM | POA: Diagnosis not present

## 2018-01-05 DIAGNOSIS — Z91199 Patient's noncompliance with other medical treatment and regimen due to unspecified reason: Secondary | ICD-10-CM

## 2018-01-05 DIAGNOSIS — Z9119 Patient's noncompliance with other medical treatment and regimen: Secondary | ICD-10-CM

## 2018-01-05 NOTE — Progress Notes (Signed)
Subjective:    Patient ID: [name]., male    DOB: Jan 05, 1961, 57 y.o.   MRN: [id number]  DOS:  01/05/2018 Type of visit - description : ROV Interval history:  Rash: Has not seen the dermatologist yet. History of substance abuse: Still drinking, still smoking, denies other drugs. CAD: Currently asymptomatic His main concern today is lack of energy, going on since at least 2016.  Described as simply lack of energy, no DOE, chest pain, palpitations or edema. Also, urinary frequency, urgency, few times was unable to reach the bathroom and had an "accident". Denies dysuria, gross hematuria difficulty urinating. OSA: Stopped using the CPAP approximately 4 weeks ago, there was a lot of condensation on his mask so he quit.  Cardiac catheterization 09-2017 due to chest pain,  Ost LM to Mid LM lesion is 20% stenosed.  Prox LAD lesion is 100% stenosed.  LIMA graft was visualized by angiography and is normal in caliber.  Prox Cx lesion is 100% stenosed.  SVG graft was visualized by angiography and is normal in caliber.  Previously placed Origin to Prox Graft stent (unknown type) is widely patent.  Prox RCA lesion is 100% stenosed.  SVG graft was visualized by angiography and is normal in caliber.   1. Severe triple vessel CAD with occlusion of all three native vessels. He is s/p 3v CABG and all 3 vein grafts are patent.  2. Chronic mid LAD occlusion. LIMA to LAD is patent 3. Chronic mid Circumflex occlusion. SVG to OM is patent 4. Chronic proximal RCA occlusion. SVG to PDA is patent.  Recommendations: Continue medical management of CAD. Smoking cessation.     Review of Systems  See above Denies persistent cough or sputum production.   Past Medical History:  Diagnosis Date  . Abnormality of gait   . Anxiety   . CAD (coronary artery disease)    a. 10/2008 Inferolateral STEMI: 3VD w/ occluded LCX (PTCA)-->CABG x 3 (LIMA->LAD, VG->OM, VG->PDA);  b. 06/2009 NSTEMI/PCI:  VG->OM 100 (BMS);  c. 05/2013 native 3VD, 3/3 patent grafts. d. 06/2016: Inferior STEMI w/ occluded SVG-OM (DES placed). LIMA-LAD and SVG-PDA patent.  . Chronic bronchitis ([name])   . Chronic diastolic CHF (congestive heart failure) ([name])    a. 02/2015 Echo: EF 50-55%, distal septal, apical, inferobasal HK, mild LVH, mild MR, mildly dil LA.  [name] Cocaine use   . Colon polyps    a. 09/2015 colonscopy: multiple sessile polyps, path negative for high grade dysplasia.  . Depression   . Emphysema (subcutaneous) (surgical) resulting from a procedure 10/2008.   Bleb resected at time of 10/2008 CABG. Marketed emphysema noted on surgical report.  . Esophagitis    a. 2017 EGD: esophagitis, duodenitis.  [name] ETOH abuse   . GERD (gastroesophageal reflux disease)   . Hemianopia, homonymous, right    "can't see out the right side of either eye since stroke in 2016/pt description 09/23/2016  . Hepatic steatosis   . Hiatal hernia   . History of nuclear stress test 08/2017   Nuclear stress test 1/19: EF 35, inf-lat and ant-lat, apical sept/apical scar; no ischemia; Intermediate Risk  . Hyperlipidemia   . Hypertension   . Iron deficiency anemia 2010  . Morbid obesity ([name])   . Myocardial infarction ([name]) "several"  . PAD (peripheral artery disease) ([name])   . Pancreatitis 06/2015  . Sleep apnea    "don't have a mask" (09/23/2016)  . Stroke [name]) 02/2015   a. 02/2015 Carotid U/S:  1-39% bilat ICA stenosis; "left me blind" (09/23/2016)  . Tobacco abuse    still smokes  . Tubular adenoma of colon     Past Surgical History:  Procedure Laterality Date  . ABDOMINAL AORTOGRAM W/LOWER EXTREMITY N/A 09/23/2016   Procedure: Abdominal Aortogram w/Lower Extremity;  Surgeon: Kenneth Hampshire, MD;  Location: [facility];  Service: Cardiovascular;  Laterality: N/A;  . CARDIAC CATHETERIZATION  11/07/2008   EF of 50-55% -- normal LV systolic function, acute inferolateral ST elevation MI,  PTCA of the circumflex artery --  [name]. [name], [name]  . CARDIAC CATHETERIZATION N/A 07/07/2016   Procedure: Left Heart Cath and Cors/Grafts Angiography;  Surgeon: Kenneth M Martinique, MD;  Location: [facility];  Service: Cardiovascular;  Laterality: N/A;  . CARDIAC CATHETERIZATION N/A 07/07/2016   Procedure: Coronary Stent Intervention;  Surgeon: Kenneth M Martinique, MD;  Location: [facility];  Service: Cardiovascular;  Laterality: N/A;  . CORONARY ANGIOPLASTY    . CORONARY ARTERY BYPASS GRAFT     "CABG X4"  . LEFT HEART CATH AND CORS/GRAFTS ANGIOGRAPHY N/A 10/01/2017   Procedure: LEFT HEART CATH AND CORS/GRAFTS ANGIOGRAPHY;  Surgeon: Kenneth Blanks, MD;  Location: [facility];  Service: Cardiovascular;  Laterality: N/A;  . LEFT HEART CATHETERIZATION WITH CORONARY/GRAFT ANGIOGRAM N/A 05/15/2013   Procedure: LEFT HEART CATHETERIZATION WITH Beatrix Fetters;  Surgeon: Kenneth M Martinique, MD;  [facility];  Service: Cardiovascular;  Laterality: N/A;  . PERIPHERAL VASCULAR INTERVENTION  09/23/2016   Procedure: Peripheral Vascular Intervention;  Surgeon: Kenneth Hampshire, MD;  Location: [place] [facility];  Service: Cardiovascular;;  [name] SFA    Social History   Socioeconomic History  . Marital status: Divorced    Spouse name: Not on file  . Number of children: 2  . Years of education: Not on file  . Highest education level: Not on file  Occupational History  . Occupation: not working , used to be a Environmental consultant  . Financial resource strain: Not on file  . Food insecurity:    Worry: Not on file    Inability: Not on file  . Transportation needs:    Medical: Not on file    Non-medical: Not on file  Tobacco Use  . Smoking status: Current Every Day Smoker    Packs/day: 1.00    Years: 43.00    Pack years: 43.00    Types: Cigarettes  . Smokeless tobacco: Never Used  Substance and Sexual Activity  . Alcohol use: Yes    Alcohol/week: 8.4 oz    Types: 14 Cans of  beer per week    Comment: as off 12/2017: beer 12-pack q week  . Drug use: No    Comment: 09/23/2016 "none in years"  . Sexual activity: Not Currently  Lifestyle  . Physical activity:    Days per week: Not on file    Minutes per session: Not on file  . Stress: Not on file  Relationships  . Social connections:    Talks on phone: Not on file    Gets together: Not on file    Attends religious service: Not on file    Active member of club or organization: Not on file    Attends meetings of clubs or organizations: Not on file    Relationship status: Not on file  . Intimate partner violence:    Fear of current or ex partner: Not on file  Emotionally abused: Not on file    Physically abused: Not on file    Forced sexual activity: Not on file  Other Topics Concern  . Not on file  Social History Narrative   Lives w/ girlfriend       Allergies as of 01/05/2018   No Known Allergies     Medication List        Accurate as of 01/05/18 11:59 PM. Always use your most recent med list.          aspirin 81 MG chewable tablet Chew 1 tablet (81 mg total) by mouth daily.   atorvastatin 80 MG tablet Commonly known as:  LIPITOR Take 1 tablet (80 mg total) by mouth daily at 6 PM.   augmented betamethasone dipropionate 0.05 % cream Commonly known as:  DIPROLENE-AF Apply topically 2 (two) times daily.   cholecalciferol 1000 units tablet Commonly known as:  VITAMIN D Take 1,000 Units by mouth daily.   clopidogrel 75 MG tablet Commonly known as:  PLAVIX Take 1 tablet (75 mg total) by mouth daily.   fenofibrate 145 MG tablet Commonly known as:  TRICOR Take 1 tablet (145 mg total) by mouth daily.   isosorbide mononitrate 60 MG 24 hr tablet Commonly known as:  IMDUR Take 1 tablet (60 mg total) by mouth daily.   lisinopril 5 MG tablet Commonly known as:  PRINIVIL,ZESTRIL Take 1 tablet (5 mg total) by mouth daily.   metoprolol tartrate 25 MG tablet Commonly known as:   LOPRESSOR Take 1 tablet (25 mg total) by mouth 2 (two) times daily.   NITROSTAT 0.4 MG SL tablet Generic drug:  nitroGLYCERIN PLACE 1 TABLET (0.4 MG TOTAL) UNDER THE TONGUE EVERY 5 MINUTES AS NEEDED FOR CHEST PAIN.   pantoprazole 40 MG tablet Commonly known as:  PROTONIX Take 1 tablet (40 mg total) by mouth 2 (two) times daily before a meal.          Objective:   Physical Exam BP 122/84 (BP Location: Left Arm, Patient Position: Sitting, Cuff Size: Normal)   Pulse 72   Temp 98.2 F (36.8 C) (Oral)   Resp 16   Ht 5\' 10"  (1.778 m)   Wt 221 lb 6 oz (100.4 kg)   SpO2 96%   BMI 31.76 kg/m   General:   Well developed, well nourished . NAD.  HEENT:  Normocephalic . Face symmetric, atraumatic Lungs:  Few rhonchi.  Slight increased expiratory time Normal respiratory effort, no intercostal retractions, no accessory muscle use. Heart: RRR,  no murmur.  no pretibial edema bilaterally  Abdomen:  Not distended, soft, non-tender. No rebound or rigidity.   Rectal: External abnormalities: none. Normal sphincter tone. No rectal masses or tenderness.  No stools for home Prostate: Prostate gland firm and smooth, no enlargement, nodularity, tenderness, mass, asymmetry or induration Skin: Not pale. Not jaundice Neurologic:  alert & oriented X3.  Speech normal, gait appropriate for age and unassisted Psych--  Cognition and judgment appear intact.  Cooperative with normal attention span and concentration.  Behavior appropriate. No anxious or depressed appearing.     Assessment & Plan:  Assessment >  Prediabetes  HTN CV: --CAD: 2010 angioplasty f/u by  CABG 2010, cath 2014 -- CP, cath 09/2017, patent grafts, medical treatment --PVD --Stroke 2008, 02-2015 Anxiety depression  OSA: Sleep study 01/2017, significant sleep apnea noted Abnormal gait GI: Iron def anemia dx 03-2015  ---cscope 09-2015 multiple polyps, repeat in one year ---EGD-2017: Esophagitis, duodenitis, bx  done Pancreatitis  06/2015.(EtOH 2 days prior to onset of symptoms) H/o etoh abuse H/o cocaine  Smoker  +FH: father prostate ca , dx late 44s  Plan: Prediabetes: Check a A1c HTN: Seems controlled on lisinopril, Imdur, metoprolol, last BMP satisfactory CAD: Asymptomatic OSA: Not using CPAP for the last 4 weeks, will contact [facility] home [facility].  Last sleep study 01/2017, his equipment is no more than 55 years old.  Hopefully advance home care will help him. Heavy smoker: Suspect COPD history of his smoking congestion on exam.  No major problems with cough..  Check PFTs and a chest x-ray.  He is complaining of fatigue, O2 sat 98% , after walking 91%.  Does not feel ready to quit tobacco. Fatigue: Likely multifactorial, see above, check CBC, TSH, N79 and folic acid. LUTS: Prostate is actually small on exam, last PSA satisfactory, check a UA urine culture.  Consider Flomax Rash: Pending visit to dermatology, encouraged to call them because he forgot when the appointment was. RTC 3 months

## 2018-01-05 NOTE — Patient Instructions (Signed)
GO TO THE LAB : Get the blood work     GO TO THE FRONT DESK Schedule your next appointment for a checkup in 3 months   STOP BY THE FIRST FLOOR:  get the XR   Please call [facility] dermatology   [phone]  Call [facility] and ask them to check on your CPAP [phone]  We will schedule breathing test called PFTs

## 2018-01-05 NOTE — Progress Notes (Signed)
Pre visit review using our clinic review tool, if applicable. No additional management support is needed unless otherwise documented below in the visit note. 

## 2018-01-06 LAB — URINALYSIS, ROUTINE W REFLEX MICROSCOPIC
BILIRUBIN URINE: NEGATIVE
Ketones, ur: NEGATIVE
Nitrite: NEGATIVE
Specific Gravity, Urine: 1.015 (ref 1.000–1.030)
Total Protein, Urine: NEGATIVE
Urine Glucose: NEGATIVE
Urobilinogen, UA: 4 — AB (ref 0.0–1.0)
pH: 6.5 (ref 5.0–8.0)

## 2018-01-06 LAB — URINE CULTURE
MICRO NUMBER:: 90646608
Result:: NO GROWTH
SPECIMEN QUALITY: ADEQUATE

## 2018-01-06 LAB — HEMOGLOBIN A1C: HEMOGLOBIN A1C: 6.4 % (ref 4.6–6.5)

## 2018-01-06 LAB — CBC WITH DIFFERENTIAL/PLATELET
Basophils Absolute: 0.1 10*3/uL (ref 0.0–0.1)
Basophils Relative: 0.8 % (ref 0.0–3.0)
EOS ABS: 0.1 10*3/uL (ref 0.0–0.7)
EOS PCT: 1.1 % (ref 0.0–5.0)
HCT: 47.4 % (ref 39.0–52.0)
Hemoglobin: 15.9 g/dL (ref 13.0–17.0)
LYMPHS ABS: 1.2 10*3/uL (ref 0.7–4.0)
Lymphocytes Relative: 17.1 % (ref 12.0–46.0)
MCHC: 33.5 g/dL (ref 30.0–36.0)
MCV: 102.6 fl — ABNORMAL HIGH (ref 78.0–100.0)
MONO ABS: 0.8 10*3/uL (ref 0.1–1.0)
Monocytes Relative: 11.4 % (ref 3.0–12.0)
Neutro Abs: 5 10*3/uL (ref 1.4–7.7)
Neutrophils Relative %: 69.6 % (ref 43.0–77.0)
Platelets: 194 10*3/uL (ref 150.0–400.0)
RBC: 4.62 Mil/uL (ref 4.22–5.81)
RDW: 15 % (ref 11.5–15.5)
WBC: 7.2 10*3/uL (ref 4.0–10.5)

## 2018-01-06 LAB — B12 AND FOLATE PANEL
Folate: 4.9 ng/mL — ABNORMAL LOW (ref >5.9)
VITAMIN B 12: 301 pg/mL (ref 211–911)

## 2018-01-06 NOTE — Assessment & Plan Note (Signed)
Prediabetes: Check a A1c HTN: Seems controlled on lisinopril, Imdur, metoprolol, last BMP satisfactory CAD: Asymptomatic OSA: Not using CPAP for the last 4 weeks, will contact advance home care.  Last sleep study 01/2017, his equipment is no more than 55 years old.  Hopefully advance home care will help him. Heavy smoker: Suspect COPD history of his smoking congestion on exam.  No major problems with cough..  Check PFTs and a chest x-ray.  He is complaining of fatigue, O2 sat 98% , after walking 91%.  Does not feel ready to quit tobacco. Fatigue: Likely multifactorial, see above, check CBC, TSH, B35 and folic acid. LUTS: Prostate is actually small on exam, last PSA satisfactory, check a UA urine culture.  Consider Flomax Rash: Pending visit to dermatology, encouraged to call them because he forgot when the appointment was. RTC 3 months

## 2018-01-10 ENCOUNTER — Telehealth: Payer: Self-pay | Admitting: Internal Medicine

## 2018-01-10 MED ORDER — FOLIC ACID 1 MG PO TABS: 1.0000 mg | ORAL_TABLET | Freq: Every day | ORAL | 3 refills | Status: DC

## 2018-01-10 NOTE — Telephone Encounter (Signed)
I do not see where our office called regarding results.

## 2018-01-10 NOTE — Addendum Note (Signed)
Addended byDamita Dunnings D on: 01/10/2018 08:40 AM   Modules accepted: Orders

## 2018-01-10 NOTE — Telephone Encounter (Signed)
Copied from [place] [phone]. Topic: Quick Communication - See Telephone Encounter >> Jan 10, 2018  2:22 PM Vernona Rieger wrote: CRM for notification. See Telephone encounter for: 01/10/18.  Patient would like a call back again regarding labs. He said he missed a call. Call back @ [phone]

## 2018-01-27 DIAGNOSIS — L821 Other seborrheic keratosis: Secondary | ICD-10-CM | POA: Diagnosis not present

## 2018-01-27 DIAGNOSIS — B078 Other viral warts: Secondary | ICD-10-CM | POA: Diagnosis not present

## 2018-01-27 DIAGNOSIS — L400 Psoriasis vulgaris: Secondary | ICD-10-CM | POA: Diagnosis not present

## 2018-05-10 ENCOUNTER — Other Ambulatory Visit: Payer: Self-pay

## 2018-05-10 MED ORDER — NITROGLYCERIN 0.4 MG SL SUBL: SUBLINGUAL_TABLET | SUBLINGUAL | 0 refills | Status: DC

## 2018-07-11 ENCOUNTER — Other Ambulatory Visit: Payer: Self-pay | Admitting: Cardiovascular Disease

## 2018-07-25 ENCOUNTER — Other Ambulatory Visit: Payer: Self-pay | Admitting: Cardiovascular Disease

## 2018-09-29 ENCOUNTER — Other Ambulatory Visit: Payer: Self-pay | Admitting: Cardiovascular Disease

## 2018-09-30 NOTE — Telephone Encounter (Signed)
I spoke with pt and made him aware he is due for follow in our office.  I scheduled him to see Ermalinda Barrios, PA on March 24,2020 at 1:00.  Pt reports he is feeling fine. Will send 90 day supply of medications to his pharmacy.  He is aware to keep appointment in March for further refills.

## 2018-09-30 NOTE — Telephone Encounter (Signed)
I placed call to pt to schedule follow up appointment.  Left message to call office.

## 2018-10-13 ENCOUNTER — Inpatient Hospital Stay (HOSPITAL_COMMUNITY): Admission: RE | Admit: 2018-10-13 | Payer: Medicare HMO | Source: Ambulatory Visit

## 2018-10-20 ENCOUNTER — Encounter: Payer: Self-pay | Admitting: Physician Assistant

## 2018-10-26 ENCOUNTER — Telehealth: Payer: Self-pay

## 2018-10-26 NOTE — Telephone Encounter (Signed)
Appointment Cancelled/Rescheduled due to Coronavirus:  Called patient in regards to 1 year f/u appointment with Kenneth Barrios, PA on 11/01/18.   Patient has not been in contact with someone that was recently sick with fever/cough or confirmed to have the [place] virus.  Patient denies having [cough, fever (100.4 or greater)], and/or shortness of breath).  Patient denies having any chest pain, SOB, increasing edema, wt gain, or increase in abdominal girth, syncope, or any other Sx.  Patient has been rescheduled for 5/19.   Patient understands to let us know if they develop any Sx before then.  Patient states that he does not need refills sent in at this time.  Visitor policy has been reviewed with the patient.

## 2018-11-01 ENCOUNTER — Ambulatory Visit: Payer: Medicare HMO | Admitting: Physician Assistant

## 2018-11-21 ENCOUNTER — Telehealth: Payer: Self-pay

## 2018-11-21 NOTE — Telephone Encounter (Signed)
Overdue for visit- Sherri- can you call Pt and set up virtual visit. Thank you.

## 2018-11-22 ENCOUNTER — Telehealth: Payer: Self-pay | Admitting: Internal Medicine

## 2018-11-22 NOTE — Telephone Encounter (Signed)
LMOM informing Pt to return call.  

## 2018-11-25 NOTE — Telephone Encounter (Signed)
LMOM virtual visit?

## 2018-11-26 ENCOUNTER — Inpatient Hospital Stay (HOSPITAL_COMMUNITY): Payer: Medicare HMO

## 2018-11-26 ENCOUNTER — Emergency Department (HOSPITAL_COMMUNITY): Payer: Medicare HMO

## 2018-11-26 ENCOUNTER — Inpatient Hospital Stay (HOSPITAL_COMMUNITY)
Admission: EM | Admit: 2018-11-26 | Discharge: 2018-11-28 | DRG: 065 | Disposition: A | Discharge status: Home Health | Payer: Medicare HMO | Attending: Family Medicine | Admitting: Family Medicine

## 2018-11-26 ENCOUNTER — Other Ambulatory Visit: Payer: Self-pay

## 2018-11-26 ENCOUNTER — Encounter (HOSPITAL_COMMUNITY): Payer: Self-pay | Admitting: Internal Medicine

## 2018-11-26 DIAGNOSIS — I6781 Acute cerebrovascular insufficiency: Secondary | ICD-10-CM | POA: Diagnosis not present

## 2018-11-26 DIAGNOSIS — R531 Weakness: Secondary | ICD-10-CM | POA: Diagnosis present

## 2018-11-26 DIAGNOSIS — Z7982 Long term (current) use of aspirin: Secondary | ICD-10-CM | POA: Diagnosis not present

## 2018-11-26 DIAGNOSIS — I6501 Occlusion and stenosis of right vertebral artery: Secondary | ICD-10-CM | POA: Diagnosis present

## 2018-11-26 DIAGNOSIS — I69314 Frontal lobe and executive function deficit following cerebral infarction: Secondary | ICD-10-CM

## 2018-11-26 DIAGNOSIS — I361 Nonrheumatic tricuspid (valve) insufficiency: Secondary | ICD-10-CM | POA: Diagnosis not present

## 2018-11-26 DIAGNOSIS — R29705 NIHSS score 5: Secondary | ICD-10-CM | POA: Diagnosis present

## 2018-11-26 DIAGNOSIS — R202 Paresthesia of skin: Secondary | ICD-10-CM | POA: Diagnosis present

## 2018-11-26 DIAGNOSIS — R200 Anesthesia of skin: Secondary | ICD-10-CM | POA: Diagnosis not present

## 2018-11-26 DIAGNOSIS — I6381 Other cerebral infarction due to occlusion or stenosis of small artery: Secondary | ICD-10-CM | POA: Diagnosis not present

## 2018-11-26 DIAGNOSIS — I6523 Occlusion and stenosis of bilateral carotid arteries: Secondary | ICD-10-CM | POA: Diagnosis present

## 2018-11-26 DIAGNOSIS — H53461 Homonymous bilateral field defects, right side: Secondary | ICD-10-CM | POA: Diagnosis present

## 2018-11-26 DIAGNOSIS — Z833 Family history of diabetes mellitus: Secondary | ICD-10-CM

## 2018-11-26 DIAGNOSIS — F329 Major depressive disorder, single episode, unspecified: Secondary | ICD-10-CM | POA: Diagnosis present

## 2018-11-26 DIAGNOSIS — Z79899 Other long term (current) drug therapy: Secondary | ICD-10-CM

## 2018-11-26 DIAGNOSIS — I10 Essential (primary) hypertension: Secondary | ICD-10-CM | POA: Diagnosis not present

## 2018-11-26 DIAGNOSIS — F1410 Cocaine abuse, uncomplicated: Secondary | ICD-10-CM | POA: Diagnosis present

## 2018-11-26 DIAGNOSIS — K219 Gastro-esophageal reflux disease without esophagitis: Secondary | ICD-10-CM | POA: Diagnosis present

## 2018-11-26 DIAGNOSIS — K209 Esophagitis, unspecified: Secondary | ICD-10-CM | POA: Diagnosis present

## 2018-11-26 DIAGNOSIS — Z800 Family history of malignant neoplasm of digestive organs: Secondary | ICD-10-CM

## 2018-11-26 DIAGNOSIS — J439 Emphysema, unspecified: Secondary | ICD-10-CM | POA: Diagnosis present

## 2018-11-26 DIAGNOSIS — I5032 Chronic diastolic (congestive) heart failure: Secondary | ICD-10-CM | POA: Diagnosis not present

## 2018-11-26 DIAGNOSIS — F17210 Nicotine dependence, cigarettes, uncomplicated: Secondary | ICD-10-CM | POA: Diagnosis present

## 2018-11-26 DIAGNOSIS — I110 Hypertensive heart disease with heart failure: Secondary | ICD-10-CM | POA: Diagnosis present

## 2018-11-26 DIAGNOSIS — I340 Nonrheumatic mitral (valve) insufficiency: Secondary | ICD-10-CM | POA: Diagnosis not present

## 2018-11-26 DIAGNOSIS — F419 Anxiety disorder, unspecified: Secondary | ICD-10-CM | POA: Diagnosis present

## 2018-11-26 DIAGNOSIS — Z6831 Body mass index (BMI) 31.0-31.9, adult: Secondary | ICD-10-CM

## 2018-11-26 DIAGNOSIS — I639 Cerebral infarction, unspecified: Secondary | ICD-10-CM | POA: Diagnosis not present

## 2018-11-26 DIAGNOSIS — Z7902 Long term (current) use of antithrombotics/antiplatelets: Secondary | ICD-10-CM | POA: Diagnosis not present

## 2018-11-26 DIAGNOSIS — I2510 Atherosclerotic heart disease of native coronary artery without angina pectoris: Secondary | ICD-10-CM | POA: Diagnosis present

## 2018-11-26 DIAGNOSIS — G4733 Obstructive sleep apnea (adult) (pediatric): Secondary | ICD-10-CM | POA: Diagnosis present

## 2018-11-26 DIAGNOSIS — I252 Old myocardial infarction: Secondary | ICD-10-CM

## 2018-11-26 DIAGNOSIS — I69398 Other sequelae of cerebral infarction: Secondary | ICD-10-CM

## 2018-11-26 DIAGNOSIS — F10231 Alcohol dependence with withdrawal delirium: Secondary | ICD-10-CM | POA: Diagnosis not present

## 2018-11-26 DIAGNOSIS — Z8249 Family history of ischemic heart disease and other diseases of the circulatory system: Secondary | ICD-10-CM

## 2018-11-26 DIAGNOSIS — R0602 Shortness of breath: Secondary | ICD-10-CM | POA: Diagnosis not present

## 2018-11-26 DIAGNOSIS — Z86010 Personal history of colonic polyps: Secondary | ICD-10-CM

## 2018-11-26 DIAGNOSIS — R269 Unspecified abnormalities of gait and mobility: Secondary | ICD-10-CM | POA: Diagnosis present

## 2018-11-26 DIAGNOSIS — R471 Dysarthria and anarthria: Secondary | ICD-10-CM | POA: Diagnosis present

## 2018-11-26 DIAGNOSIS — E785 Hyperlipidemia, unspecified: Secondary | ICD-10-CM | POA: Diagnosis present

## 2018-11-26 DIAGNOSIS — I6782 Cerebral ischemia: Secondary | ICD-10-CM | POA: Diagnosis not present

## 2018-11-26 DIAGNOSIS — R4789 Other speech disturbances: Secondary | ICD-10-CM | POA: Diagnosis present

## 2018-11-26 DIAGNOSIS — Z955 Presence of coronary angioplasty implant and graft: Secondary | ICD-10-CM

## 2018-11-26 DIAGNOSIS — Z8673 Personal history of transient ischemic attack (TIA), and cerebral infarction without residual deficits: Secondary | ICD-10-CM | POA: Diagnosis not present

## 2018-11-26 LAB — COMPREHENSIVE METABOLIC PANEL
ALT: 19 U/L (ref 0–44)
AST: 22 U/L (ref 15–41)
Albumin: 3.9 g/dL (ref 3.5–5.0)
Alkaline Phosphatase: 88 U/L (ref 38–126)
Anion gap: 16 — ABNORMAL HIGH (ref 5–15)
BUN: 9 mg/dL (ref 6–20)
CO2: 24 mmol/L (ref 22–32)
Calcium: 9.4 mg/dL (ref 8.9–10.3)
Chloride: 95 mmol/L — ABNORMAL LOW (ref 98–111)
Creatinine, Ser: 0.91 mg/dL (ref 0.61–1.24)
GFR calc Af Amer: >60 mL/min (ref >60)
GFR calc non Af Amer: >60 mL/min (ref >60)
Glucose, Bld: 104 mg/dL — ABNORMAL HIGH (ref 70–99)
Potassium: 4.6 mmol/L (ref 3.5–5.1)
Sodium: 135 mmol/L (ref 135–145)
Total Bilirubin: 2.1 mg/dL — ABNORMAL HIGH (ref 0.3–1.2)
Total Protein: 7.8 g/dL (ref 6.5–8.1)

## 2018-11-26 LAB — URINALYSIS, ROUTINE W REFLEX MICROSCOPIC
Bilirubin Urine: NEGATIVE
Glucose, UA: NEGATIVE mg/dL
Hgb urine dipstick: NEGATIVE
Ketones, ur: NEGATIVE mg/dL
Leukocytes,Ua: NEGATIVE
Nitrite: NEGATIVE
Protein, ur: NEGATIVE mg/dL
Specific Gravity, Urine: 1.024 (ref 1.005–1.030)
pH: 6 (ref 5.0–8.0)

## 2018-11-26 LAB — CBC
HCT: 53.7 % — ABNORMAL HIGH (ref 39.0–52.0)
Hemoglobin: 17.4 g/dL — ABNORMAL HIGH (ref 13.0–17.0)
MCH: 31.9 pg (ref 26.0–34.0)
MCHC: 32.4 g/dL (ref 30.0–36.0)
MCV: 98.5 fL (ref 80.0–100.0)
Platelets: 165 10*3/uL (ref 150–400)
RBC: 5.45 MIL/uL (ref 4.22–5.81)
RDW: 14.9 % (ref 11.5–15.5)
WBC: 7.7 10*3/uL (ref 4.0–10.5)
nRBC: 0 % (ref 0.0–0.2)

## 2018-11-26 LAB — DIFFERENTIAL
Abs Immature Granulocytes: 0.02 10*3/uL (ref 0.00–0.07)
Basophils Absolute: 0 10*3/uL (ref 0.0–0.1)
Basophils Relative: 0 %
Eosinophils Absolute: 0.1 10*3/uL (ref 0.0–0.5)
Eosinophils Relative: 1 %
Immature Granulocytes: 0 %
Lymphocytes Relative: 15 %
Lymphs Abs: 1.2 10*3/uL (ref 0.7–4.0)
Monocytes Absolute: 1 10*3/uL (ref 0.1–1.0)
Monocytes Relative: 13 %
Neutro Abs: 5.4 10*3/uL (ref 1.7–7.7)
Neutrophils Relative %: 71 %

## 2018-11-26 LAB — RAPID URINE DRUG SCREEN, HOSP PERFORMED
Amphetamines: NOT DETECTED
Barbiturates: NOT DETECTED
Benzodiazepines: NOT DETECTED
Cocaine: NOT DETECTED
Opiates: NOT DETECTED
Tetrahydrocannabinol: POSITIVE — AB

## 2018-11-26 LAB — APTT: aPTT: 34 seconds (ref 24–36)

## 2018-11-26 LAB — PROTIME-INR
INR: 1.1 (ref 0.8–1.2)
Prothrombin Time: 14.1 seconds (ref 11.4–15.2)

## 2018-11-26 LAB — TROPONIN I: Troponin I: <0.03 ng/mL (ref <0.03)

## 2018-11-26 LAB — CBG MONITORING, ED: Glucose-Capillary: 104 mg/dL — ABNORMAL HIGH (ref 70–99)

## 2018-11-26 LAB — BRAIN NATRIURETIC PEPTIDE: B Natriuretic Peptide: 167.9 pg/mL — ABNORMAL HIGH (ref 0.0–100.0)

## 2018-11-26 MED ORDER — CLOPIDOGREL BISULFATE 75 MG PO TABS
75.0000 mg | ORAL_TABLET | Freq: Every day | ORAL | Status: DC
  Administered 2018-11-26 – 2018-11-28 (×3): 75 mg via ORAL
  Filled 2018-11-26 (×3): qty 1

## 2018-11-26 MED ORDER — STROKE: EARLY STAGES OF RECOVERY BOOK: Freq: Once | Status: DC

## 2018-11-26 MED ORDER — ACETAMINOPHEN 160 MG/5ML PO SOLN: 650.0000 mg | ORAL | Status: DC | PRN

## 2018-11-26 MED ORDER — NITROGLYCERIN 0.4 MG SL SUBL: 0.4000 mg | SUBLINGUAL_TABLET | SUBLINGUAL | Status: DC | PRN

## 2018-11-26 MED ORDER — PANTOPRAZOLE SODIUM 40 MG PO TBEC
40.0000 mg | DELAYED_RELEASE_TABLET | Freq: Every day | ORAL | Status: DC
  Administered 2018-11-26 – 2018-11-27 (×2): 40 mg via ORAL
  Filled 2018-11-26 (×2): qty 1

## 2018-11-26 MED ORDER — ACETAMINOPHEN 650 MG RE SUPP: 650.0000 mg | RECTAL | Status: DC | PRN

## 2018-11-26 MED ORDER — FENOFIBRATE 160 MG PO TABS
160.0000 mg | ORAL_TABLET | Freq: Every day | ORAL | Status: DC
  Administered 2018-11-27 – 2018-11-28 (×2): 160 mg via ORAL
  Filled 2018-11-26 (×2): qty 1

## 2018-11-26 MED ORDER — LORAZEPAM 2 MG/ML IJ SOLN
1.0000 mg | Freq: Once | INTRAMUSCULAR | Status: AC
  Administered 2018-11-26: 17:00:00 1 mg via INTRAVENOUS
  Filled 2018-11-26: qty 1

## 2018-11-26 MED ORDER — ACETAMINOPHEN 325 MG PO TABS: 650.0000 mg | ORAL_TABLET | ORAL | Status: DC | PRN

## 2018-11-26 MED ORDER — TRAZODONE HCL 50 MG PO TABS
50.0000 mg | ORAL_TABLET | Freq: Once | ORAL | Status: AC
  Administered 2018-11-26: 50 mg via ORAL
  Filled 2018-11-26: qty 1

## 2018-11-26 MED ORDER — SENNOSIDES-DOCUSATE SODIUM 8.6-50 MG PO TABS: 1.0000 | ORAL_TABLET | Freq: Every evening | ORAL | Status: DC | PRN

## 2018-11-26 MED ORDER — ATORVASTATIN CALCIUM 80 MG PO TABS
80.0000 mg | ORAL_TABLET | Freq: Every day | ORAL | Status: DC
  Administered 2018-11-26 – 2018-11-27 (×2): 80 mg via ORAL
  Filled 2018-11-26: qty 2
  Filled 2018-11-26: qty 1

## 2018-11-26 MED ORDER — IOHEXOL 350 MG/ML SOLN
80.0000 mL | Freq: Once | INTRAVENOUS | Status: AC | PRN
  Administered 2018-11-26: 80 mL via INTRAVENOUS

## 2018-11-26 MED ORDER — FOLIC ACID 1 MG PO TABS
1.0000 mg | ORAL_TABLET | Freq: Every day | ORAL | Status: DC
  Administered 2018-11-26 – 2018-11-28 (×3): 1 mg via ORAL
  Filled 2018-11-26 (×3): qty 1

## 2018-11-26 MED ORDER — NICOTINE 21 MG/24HR TD PT24
21.0000 mg | MEDICATED_PATCH | Freq: Every day | TRANSDERMAL | Status: DC
  Administered 2018-11-26 – 2018-11-27 (×2): 21 mg via TRANSDERMAL
  Filled 2018-11-26 (×2): qty 1

## 2018-11-26 NOTE — ED Notes (Addendum)
ED TO INPATIENT HANDOFF REPORT  ED Nurse Name and Phone #: Nigel Mormon [phone]  S Name/Age/Gender [name]. 58 y.o. male Room/Bed: 028C/028C  Code Status   Code Status: Prior  Home/SNF/Other Home Patient oriented to: self, place, time and situation Is this baseline? Yes   Triage Complete: Triage complete  Chief Complaint possible stroke  Triage Note Son stated, last normal yesterday with slurred speech, he has had numerous strokes per pt.   Son stated, his dad stated it will probably pass it did not. And numb on left side.  Neg arm drift symetrical smile Slurred speech,  Pt. In wheelchair from truck, did not ambulate.  Patient reports increase in R sided numbness and weakness since yesterday. LKW 1200 when he laid down for nap - upon waking about 1300, he noticed his R side felt weaker and more numb as well as new slurred speech and "trouble talking." He endorses history of multiple CVAs, resulting in residual R sided weakness and numbness and decreased vision in R eye (no peripheral vision in R eye per patient). He thought symptoms would initially go away. Also reports "feeling claustrophobic, like he can't breathe." Patient respirations appear to be labored - tachypneic, noisy - but he states he always breathes like this.   Patient A&O x 4. R leg drift noted as well as decreased sensation in R side compared to L side. Slurred speech noted as well - no apparent aphasia, but he states he sometimes can't find the right word.    Allergies No Known Allergies  Level of Care/Admitting Diagnosis ED Disposition    ED Disposition Condition [facility] Area: [place] [[id number]]  Level of Care: Telemetry Medical [104]  Covid Evaluation: N/A  Diagnosis: CVA (cerebral vascular accident) [facility]) [[id number]]  Admitting Physician: Lenna Sciara [phone]  Attending Physician: Lenna Sciara [phone]  Estimated length of stay: past midnight  tomorrow  Certification:: I certify this patient will need inpatient services for at least 2 midnights  PT Class (Do Not Modify): Inpatient [101]  PT Acc Code (Do Not Modify): Private [1]       B Medical/Surgery History Past Medical History:  Diagnosis Date  . Abnormality of gait   . Anxiety   . CAD (coronary artery disease)    a. 10/2008 Inferolateral STEMI: 3VD w/ occluded LCX (PTCA)-->CABG x 3 (LIMA->LAD, VG->OM, VG->PDA);  b. 06/2009 NSTEMI/PCI: VG->OM 100 (BMS);  c. 05/2013 native 3VD, 3/3 patent grafts. d. 06/2016: Inferior STEMI w/ occluded SVG-OM (DES placed). LIMA-LAD and SVG-PDA patent.  . Chronic bronchitis ([place])   . Chronic diastolic CHF (congestive heart failure) ([place])    a. 02/2015 Echo: EF 50-55%, distal septal, apical, inferobasal HK, mild LVH, mild MR, mildly dil LA.  [name] Cocaine use   . Colon polyps    a. 09/2015 colonscopy: multiple sessile polyps, path negative for high grade dysplasia.  . Depression   . Emphysema (subcutaneous) (surgical) resulting from a procedure 10/2008.   Bleb resected at time of 10/2008 CABG. Marketed emphysema noted on surgical report.  . Esophagitis    a. 2017 EGD: esophagitis, duodenitis.  [name] ETOH abuse   . GERD (gastroesophageal reflux disease)   . Hemianopia, homonymous, right    "can't see out the right side of either eye since stroke in 2016/pt description 09/23/2016  . Hepatic steatosis   . Hiatal hernia   . History of nuclear stress test 08/2017   Nuclear stress test 1/19: EF  35, inf-lat and ant-lat, apical sept/apical scar; no ischemia; Intermediate Risk  . Hyperlipidemia   . Hypertension   . Iron deficiency anemia 2010  . Morbid obesity ([place])   . Myocardial infarction ([place]) "several"  . PAD (peripheral artery disease) ([place])   . Pancreatitis 06/2015  . Sleep apnea    "don't have a mask" (09/23/2016)  . Stroke ([place]) 02/2015   a. 02/2015 Carotid U/S: 1-39% bilat ICA stenosis; "left me blind" (09/23/2016)  . Tobacco abuse    still  smokes  . Tubular adenoma of colon    Past Surgical History:  Procedure Laterality Date  . ABDOMINAL AORTOGRAM W/LOWER EXTREMITY N/A 09/23/2016   Procedure: Abdominal Aortogram w/Lower Extremity;  Surgeon: Wellington Hampshire, MD;  Location: [place] [facility];  Service: Cardiovascular;  Laterality: N/A;  . CARDIAC CATHETERIZATION  11/07/2008   EF of 50-55% -- normal LV systolic function, acute inferolateral ST elevation MI,  PTCA of the circumflex artery -- [name]. [name], [name]  . CARDIAC CATHETERIZATION N/A 07/07/2016   Procedure: Left Heart Cath and Cors/Grafts Angiography;  Surgeon: [name], MD;  Location: [place] [facility];  Service: Cardiovascular;  Laterality: N/A;  . CARDIAC CATHETERIZATION N/A 07/07/2016   Procedure: Coronary Stent Intervention;  Surgeon: [name], MD;  Location: [place] [facility];  Service: Cardiovascular;  Laterality: N/A;  . CORONARY ANGIOPLASTY    . CORONARY ARTERY BYPASS GRAFT     "CABG X4"  . LEFT HEART CATH AND CORS/GRAFTS ANGIOGRAPHY N/A 10/01/2017   Procedure: LEFT HEART CATH AND CORS/GRAFTS ANGIOGRAPHY;  Surgeon: Burnell Blanks, MD;  Location: [place] [facility];  Service: Cardiovascular;  Laterality: N/A;  . LEFT HEART CATHETERIZATION WITH CORONARY/GRAFT ANGIOGRAM N/A 05/15/2013   Procedure: LEFT HEART CATHETERIZATION WITH Beatrix Fetters;  Surgeon: [name], MD;  Location: [facility] C[facility] LAB;  Service: Cardiovascular;  Laterality: N/A;  . PERIPHERAL VASCULAR INTERVENTION  09/23/2016   Procedure: Peripheral Vascular Intervention;  Surgeon: Wellington Hampshire, MD;  Location: [place] [facility];  Service: Cardiovascular;;  Lt. SFA     A IV Location/Drains/Wounds Patient Lines/Drains/Airways Status   Active Line/Drains/Airways    Name:   Placement date:   Placement time:   Site:   Days:   Peripheral IV 11/26/18 Left Antecubital   11/26/18    1243    Antecubital   less than 1          Intake/Output Last 24 hours No intake  or output data in the 24 hours ending 11/26/18 1701  Labs/Imaging Results for orders placed or performed during the hospital encounter of 11/26/18 (from the past 48 hour(s))  CBG monitoring, ED     Status: Abnormal   Collection Time: 11/26/18 12:14 PM  Result Value Ref Range   Glucose-Capillary 104 (H) 70 - 99 mg/dL   Comment 1 Notify RN    Comment 2 Document in Chart   Protime-INR     Status: None   Collection Time: 11/26/18 12:22 PM  Result Value Ref Range   Prothrombin Time 14.1 11.4 - 15.2 seconds   INR 1.1 0.8 - 1.2    Comment: (NOTE) INR goal varies based on device and disease states. Performed at [facility], [place] [address] [place], [place] [address]   APTT     Status: None   Collection Time: 11/26/18 12:22 PM  Result Value Ref Range   aPTT 34 24 - 36 seconds    Comment: Performed at  [facility], [place] [address], [place], [place] [address]  CBC     Status: Abnormal   Collection Time: 11/26/18 12:22 PM  Result Value Ref Range   WBC 7.7 4.0 - 10.5 K/uL   RBC 5.45 4.22 - 5.81 MIL/uL   Hemoglobin 17.4 (H) 13.0 - 17.0 g/dL   HCT 53.7 (H) 39.0 - 52.0 %   MCV 98.5 80.0 - 100.0 fL   MCH 31.9 26.0 - 34.0 pg   MCHC 32.4 30.0 - 36.0 g/dL   RDW 14.9 11.5 - 15.5 %   Platelets 165 150 - 400 K/uL   nRBC 0.0 0.0 - 0.2 %    Comment: Performed at [facility], [place] [address], [place], [place] [address]  Differential     Status: None   Collection Time: 11/26/18 12:22 PM  Result Value Ref Range   Neutrophils Relative % 71 %   Neutro Abs 5.4 1.7 - 7.7 K/uL   Lymphocytes Relative 15 %   Lymphs Abs 1.2 0.7 - 4.0 K/uL   Monocytes Relative 13 %   Monocytes Absolute 1.0 0.1 - 1.0 K/uL   Eosinophils Relative 1 %   Eosinophils Absolute 0.1 0.0 - 0.5 K/uL   Basophils Relative 0 %   Basophils Absolute 0.0 0.0 - 0.1 K/uL   Immature Granulocytes 0 %   Abs Immature Granulocytes 0.02 0.00 - 0.07 K/uL    Comment: Performed at [facility] [address],  [place], [place] [address]  Comprehensive metabolic panel     Status: Abnormal   Collection Time: 11/26/18 12:22 PM  Result Value Ref Range   Sodium 135 135 - 145 mmol/L   Potassium 4.6 3.5 - 5.1 mmol/L   Chloride 95 (L) 98 - 111 mmol/L   CO2 24 22 - 32 mmol/L   Glucose, Bld 104 (H) 70 - 99 mg/dL   BUN 9 6 - 20 mg/dL   Creatinine, Ser 0.91 0.61 - 1.24 mg/dL   Calcium 9.4 8.9 - 10.3 mg/dL   Total Protein 7.8 6.5 - 8.1 g/dL   Albumin 3.9 3.5 - 5.0 g/dL   AST 22 15 - 41 U/L   ALT 19 0 - 44 U/L   Alkaline Phosphatase 88 38 - 126 U/L   Total Bilirubin 2.1 (H) 0.3 - 1.2 mg/dL   GFR calc non Af Amer >60 >60 mL/min   GFR calc Af Amer >60 >60 mL/min   Anion gap 16 (H) 5 - 15    Comment: Performed at [facility] [address] [place], [place] [address]  Troponin I - ONCE - STAT     Status: None   Collection Time: 11/26/18 12:22 PM  Result Value Ref Range   Troponin I <0.03 <0.03 ng/mL    Comment: Performed at [facility], [place] [address] [place], [place] [address]  Brain natriuretic peptide     Status: Abnormal   Collection Time: 11/26/18 12:22 PM  Result Value Ref Range   B Natriuretic Peptide 167.9 (H) 0.0 - 100.0 pg/mL    Comment: Performed at [facility] [address] [place], [place] [address]  Urinalysis, Routine w reflex microscopic     Status: None   Collection Time: 11/26/18  3:20 PM  Result Value Ref Range   Color, Urine YELLOW YELLOW   APPearance CLEAR CLEAR   Specific Gravity, Urine 1.024 1.005 - 1.030   pH 6.0 5.0 - 8.0  Glucose, UA NEGATIVE NEGATIVE mg/dL   Hgb urine dipstick NEGATIVE NEGATIVE   Bilirubin Urine NEGATIVE NEGATIVE   Ketones, ur NEGATIVE NEGATIVE mg/dL   Protein, ur NEGATIVE NEGATIVE mg/dL   Nitrite NEGATIVE NEGATIVE   Leukocytes,Ua NEGATIVE NEGATIVE    Comment: Performed at [facility] [address] [place], [place] [address]   Ct Angio Head W Or Wo Contrast  Result Date: 11/26/2018 CLINICAL DATA:  Right-sided  numbness since yesterday. History of CVA. EXAM: CT ANGIOGRAPHY HEAD AND NECK TECHNIQUE: Multidetector CT imaging of the head and neck was performed using the standard protocol during bolus administration of intravenous contrast. Multiplanar CT image reconstructions and MIPs were obtained to evaluate the vascular anatomy. Carotid stenosis measurements (when applicable) are obtained utilizing NASCET criteria, using the distal internal carotid diameter as the denominator. CONTRAST:  4mL OMNIPAQUE IOHEXOL 350 MG/ML SOLN COMPARISON:  Head MRA 02/20/2015 and neck MRA 12/06/2006 FINDINGS: CTA NECK FINDINGS Aortic arch: Standard 3 vessel aortic arch with mild atherosclerotic plaque. No significant arch vessel origin stenosis. Right carotid system: Patent with moderate soft and calcified plaque at the carotid bifurcation and in the distal cervical ICA. No evidence of significant stenosis or dissection. Left carotid system: Patent with moderate calcified and soft plaque at the carotid bifurcation and in the mid cervical ICA. No evidence of significant stenosis or dissection. Vertebral arteries: The vertebral arteries are patent with the left being moderately dominant. There is chronic moderate to severe stenosis of the right vertebral artery origin. No significant stenosis is seen on the left. Skeleton: No acute osseous abnormality or suspicious osseous lesion. Other neck: No mass or enlarged lymph nodes. Upper chest: Motion artifact and mild dependent atelectasis in both lung apices. Review of the MIP images confirms the above findings CTA HEAD FINDINGS Anterior circulation: The internal carotid arteries are patent from skull base to carotid termini with left greater than right siphon atherosclerosis resulting in up to mild left cavernous stenosis. ACAs and MCAs are patent without evidence of proximal branch occlusion or significant proximal stenosis. No aneurysm is identified. Posterior circulation: The intracranial  vertebral arteries are patent to the basilar with mild atherosclerotic irregularity bilaterally but no significant stenosis. Patent left PICA, bilateral AICAs, and bilateral SCAs are identified. The basilar artery is patent without significant stenosis. PCAs are patent without evidence of significant stenosis. No aneurysm is identified. Venous sinuses: As permitted by contrast timing, patent. Anatomic variants: None. Delayed phase: No abnormal enhancement. Review of the MIP images confirms the above findings IMPRESSION: 1. No large vessel occlusion. 2. Cervical carotid artery atherosclerosis without significant stenosis. 3. Chronic moderate to severe right vertebral artery origin stenosis. 4. Mild intracranial atherosclerosis without flow limiting proximal stenosis. 5.  Aortic Atherosclerosis (ICD10-I70.0). Electronically Signed   By: Logan Bores [name]   On: 11/26/2018 15:26   Ct Head Wo Contrast  Result Date: 11/26/2018 CLINICAL DATA:  58 year old male with acute RIGHT sided numbness. EXAM: CT HEAD WITHOUT CONTRAST TECHNIQUE: Contiguous axial images were obtained from the base of the skull through the vertex without intravenous contrast. COMPARISON:  02/20/2015 MR and 11/21/2006 CT FINDINGS: Brain: Bilateral basal ganglia lacunar infarcts are age indeterminate. No other evidence of acute infarction, hemorrhage, hydrocephalus, extra-axial collection or mass lesion/mass effect. Atrophy, chronic small-vessel white matter ischemic changes and remote RIGHT cerebellar and LEFT thalamic infarcts noted. Vascular: Atherosclerotic calcifications noted. Skull: Normal. Negative for fracture or focal lesion. Sinuses/Orbits: No acute finding. Other: None. IMPRESSION: 1. Age indeterminate bilateral basal ganglia  lacunar infarcts, but most likely subacute to remote. No hemorrhage. 2. Atrophy, chronic small-vessel white matter ischemic changes and remote RIGHT cerebellar and LEFT thalamic infarcts. Electronically Signed   By:  Margarette Canada [name]   On: 11/26/2018 13:50   Ct Angio Neck W And/or Wo Contrast  Result Date: 11/26/2018 CLINICAL DATA:  Right-sided numbness since yesterday. History of CVA. EXAM: CT ANGIOGRAPHY HEAD AND NECK TECHNIQUE: Multidetector CT imaging of the head and neck was performed using the standard protocol during bolus administration of intravenous contrast. Multiplanar CT image reconstructions and MIPs were obtained to evaluate the vascular anatomy. Carotid stenosis measurements (when applicable) are obtained utilizing NASCET criteria, using the distal internal carotid diameter as the denominator. CONTRAST:  30mL OMNIPAQUE IOHEXOL 350 MG/ML SOLN COMPARISON:  Head MRA 02/20/2015 and neck MRA 12/06/2006 FINDINGS: CTA NECK FINDINGS Aortic arch: Standard 3 vessel aortic arch with mild atherosclerotic plaque. No significant arch vessel origin stenosis. Right carotid system: Patent with moderate soft and calcified plaque at the carotid bifurcation and in the distal cervical ICA. No evidence of significant stenosis or dissection. Left carotid system: Patent with moderate calcified and soft plaque at the carotid bifurcation and in the mid cervical ICA. No evidence of significant stenosis or dissection. Vertebral arteries: The vertebral arteries are patent with the left being moderately dominant. There is chronic moderate to severe stenosis of the right vertebral artery origin. No significant stenosis is seen on the left. Skeleton: No acute osseous abnormality or suspicious osseous lesion. Other neck: No mass or enlarged lymph nodes. Upper chest: Motion artifact and mild dependent atelectasis in both lung apices. Review of the MIP images confirms the above findings CTA HEAD FINDINGS Anterior circulation: The internal carotid arteries are patent from skull base to carotid termini with left greater than right siphon atherosclerosis resulting in up to mild left cavernous stenosis. ACAs and MCAs are patent without evidence of  proximal branch occlusion or significant proximal stenosis. No aneurysm is identified. Posterior circulation: The intracranial vertebral arteries are patent to the basilar with mild atherosclerotic irregularity bilaterally but no significant stenosis. Patent left PICA, bilateral AICAs, and bilateral SCAs are identified. The basilar artery is patent without significant stenosis. PCAs are patent without evidence of significant stenosis. No aneurysm is identified. Venous sinuses: As permitted by contrast timing, patent. Anatomic variants: None. Delayed phase: No abnormal enhancement. Review of the MIP images confirms the above findings IMPRESSION: 1. No large vessel occlusion. 2. Cervical carotid artery atherosclerosis without significant stenosis. 3. Chronic moderate to severe right vertebral artery origin stenosis. 4. Mild intracranial atherosclerosis without flow limiting proximal stenosis. 5.  Aortic Atherosclerosis (ICD10-I70.0). Electronically Signed   By: Logan Bores [name]   On: 11/26/2018 15:26   Dg Chest Port 1 View  Result Date: 11/26/2018 CLINICAL DATA:  Shortness of breath EXAM: PORTABLE CHEST 1 VIEW COMPARISON:  01/05/2018 FINDINGS: There is no focal parenchymal opacity. There is no pleural effusion or pneumothorax. There is stable cardiomegaly. There is evidence of prior CABG. The osseous structures are unremarkable. IMPRESSION: No active disease. Electronically Signed   By: Kathreen Devoid   On: 11/26/2018 14:21    Pending Labs Unresulted Labs (From admission, onward)    Start     Ordered   Signed and Held  HIV antibody (Routine Testing)  Once,   R     Signed and Held   Signed and Held  Hemoglobin A1c  Tomorrow morning,   R     Signed and Held  Signed and Held  Lipid panel  Tomorrow morning,   R    Comments:  Fasting    Signed and Held   Signed and Held  Urine rapid drug screen (hosp performed)not at [facility]  Once,   R     Signed and Held          Vitals/Pain Today's Vitals    11/26/18 1426 11/26/18 1545 11/26/18 1600 11/26/18 1604  BP:  (!) 156/99 (!) 147/94   Pulse:  96 77   Resp:  14 16   Temp:      TempSrc:      SpO2:  95%    Weight:      Height:      PainSc: 0-No pain   0-No pain    Isolation Precautions No active isolations  Medications Medications  iohexol (OMNIPAQUE) 350 MG/ML injection 80 mL (80 mLs Intravenous Contrast Given 11/26/18 1446)  LORazepam (ATIVAN) injection 1 mg (1 mg Intravenous Given 11/26/18 1635)    Mobility walks with person assist since onset of acute symptoms; was walking independently prior to 1200 yesterday. High fall risk   Focused Assessments Neuro Assessment Handoff:  Swallow screen pass? Yes    NIH Stroke Scale ( + Modified Stroke Scale Criteria)  Interval: Initial Level of Consciousness (1a.)   : Alert, keenly responsive LOC Questions (1b. )   +: Answers both questions correctly LOC Commands (1c. )   + : Performs both tasks correctly Best Gaze (2. )  +: Normal Visual (3. )  +: Partial hemianopia Facial Palsy (4. )    : Normal symmetrical movements Motor Arm, Left (5a. )   +: No drift Motor Arm, Right (5b. )   +: No drift Motor Leg, Left (6a. )   +: No drift Motor Leg, Right (6b. )   +: Drift Limb Ataxia (7. ): Absent Sensory (8. )   +: Mild-to-moderate sensory loss, patient feels pinprick is less sharp or is dull on the affected side, or there is a loss of superficial pain with pinprick, but patient is aware of being touched Best Language (9. )   +: No aphasia Dysarthria (10. ): Mild-to-moderate dysarthria, patient slurs at least some words and, at worst, can be understood with some difficulty Extinction/Inattention (11.)   +: No Abnormality Modified SS Total  +: 3 Complete NIHSS TOTAL: 4     Neuro Assessment: (S) Exceptions to WDL(hx multiple CVAs - onset yesterday INCREASED numbness and weakness on R side (residual numbness/weakness R side d/t previous CVA) and NEW slurred speech and "trouble  talking") Neuro Checks:   Initial (11/26/18 1234)  Last Documented NIHSS Modified Score: 3 (11/26/18 1630) Has TPA been given? No If patient is a Neuro Trauma and patient is going to OR before floor call report to [place] nurse: [phone] or [phone]     R Recommendations: See Admitting Provider Note  Report given to: Ala, RN 3 [place]  Additional Notes:

## 2018-11-26 NOTE — ED Notes (Signed)
Patient back from MRI.

## 2018-11-26 NOTE — ED Provider Notes (Signed)
[facility] EMERGENCY DEPARTMENT Provider Note   CSN: [id number] Arrival date & time: 11/26/18  1206    History   Chief Complaint Chief Complaint  Patient presents with  . Stroke Symptoms    HPI [name]. is a 58 y.o. male.     HPI Patient with a history of prior CVA and residual right-sided weakness and numbness states he took a nap at noon yesterday in his normal state of health.  When he woke at 1 PM states he was having difficulty speaking and having worsening right-sided weakness and numbness.  Patient tells me that he has had no changes in the chronic visual deficits that he has from prior strokes.  Patient does admit to some dyspnea and ongoing cough.  No fever or chills.  No known sick contacts.  Patient denies chest pain. Past Medical History:  Diagnosis Date  . Abnormality of gait   . Anxiety   . CAD (coronary artery disease)    a. 10/2008 Inferolateral STEMI: 3VD w/ occluded LCX (PTCA)-->CABG x 3 (LIMA->LAD, VG->OM, VG->PDA);  b. 06/2009 NSTEMI/PCI: VG->OM 100 (BMS);  c. 05/2013 native 3VD, 3/3 patent grafts. d. 06/2016: Inferior STEMI w/ occluded SVG-OM (DES placed). LIMA-LAD and SVG-PDA patent.  . Chronic bronchitis ([place])   . Chronic diastolic CHF (congestive heart failure) ([place])    a. 02/2015 Echo: EF 50-55%, distal septal, apical, inferobasal HK, mild LVH, mild MR, mildly dil LA.  [name] Cocaine use   . Colon polyps    a. 09/2015 colonscopy: multiple sessile polyps, path negative for high grade dysplasia.  . Depression   . Emphysema (subcutaneous) (surgical) resulting from a procedure 10/2008.   Bleb resected at time of 10/2008 CABG. Marketed emphysema noted on surgical report.  . Esophagitis    a. 2017 EGD: esophagitis, duodenitis.  [name] ETOH abuse   . GERD (gastroesophageal reflux disease)   . Hemianopia, homonymous, right    "can't see out the right side of either eye since stroke in 2016/pt description 09/23/2016  . Hepatic steatosis   .  Hiatal hernia   . History of nuclear stress test 08/2017   Nuclear stress test 1/19: EF 35, inf-lat and ant-lat, apical sept/apical scar; no ischemia; Intermediate Risk  . Hyperlipidemia   . Hypertension   . Iron deficiency anemia 2010  . Morbid obesity ([place])   . Myocardial infarction ([place]) "several"  . PAD (peripheral artery disease) ([place])   . Pancreatitis 06/2015  . Sleep apnea    "don't have a mask" (09/23/2016)  . Stroke ([place]) 02/2015   a. 02/2015 Carotid U/S: 1-39% bilat ICA stenosis; "left me blind" (09/23/2016)  . Tobacco abuse    still smokes  . Tubular adenoma of colon     Patient Active Problem List   Diagnosis Date Noted  . Hiatal hernia   . Hepatic steatosis   . Hemianopia, homonymous, right   . ETOH abuse   . Esophagitis   . Chronic diastolic CHF (congestive heart failure) ([place])   . Chronic bronchitis ([place])   . CAD (coronary artery disease)   . PAD (peripheral artery disease) ([place]) 09/23/2016  . Morbid obesity ([place])   . Chronic pain syndrome 10/23/2015  . GERD (gastroesophageal reflux disease) 08/07/2015  . Pancreatitis, acute   . PCP NOTES >>>>> 06/01/2015  . Annual physical exam 05/31/2015  . Stroke ~ 2008, 02-2015 02/20/2015  . Hx of cocaine abuse ([place]) 02/20/2015  . BPH (benign prostatic hyperplasia) 02/20/2015  . OSA (  obstructive sleep apnea) ?? 05/05/2013  . Poor compliance 05/05/2013  . Anxiety and depression 05/05/2013  . Erectile dysfunction 03/17/2012  . Emphysema (subcutaneous) (surgical) resulting from a procedure 10/08/2008  . Iron deficiency anemia 08/10/2008  . ABNORMALITY OF GAIT 08/06/2008  . TOBACCO ABUSE 07/27/2008  . Dyslipidemia 07/26/2008  . HYPERTENSION, BENIGN ESSENTIAL 07/26/2008  . ST elevation myocardial infarction involving left circumflex coronary artery ([place]) 07/26/2008    Past Surgical History:  Procedure Laterality Date  . ABDOMINAL AORTOGRAM W/LOWER EXTREMITY N/A 09/23/2016   Procedure: Abdominal Aortogram w/Lower  Extremity;  Surgeon: Wellington Hampshire, MD;  Location: [facility];  Service: Cardiovascular;  Laterality: N/A;  . CARDIAC CATHETERIZATION  11/07/2008   EF of 50-55% -- normal LV systolic function, acute inferolateral ST elevation MI,  PTCA of the circumflex artery -- [name]. [name], [name]  . CARDIAC CATHETERIZATION N/A 07/07/2016   Procedure: Left Heart Cath and Cors/Grafts Angiography;  Surgeon: [name], MD;  Location: [facility];  Service: Cardiovascular;  Laterality: N/A;  . CARDIAC CATHETERIZATION N/A 07/07/2016   Procedure: Coronary Stent Intervention;  Surgeon: [name], MD;  Location: [facility];  Service: Cardiovascular;  Laterality: N/A;  . CORONARY ANGIOPLASTY    . CORONARY ARTERY BYPASS GRAFT     "CABG X4"  . LEFT HEART CATH AND CORS/GRAFTS ANGIOGRAPHY N/A 10/01/2017   Procedure: LEFT HEART CATH AND CORS/GRAFTS ANGIOGRAPHY;  Surgeon: Burnell Blanks, MD;  Location: [facility];  Service: Cardiovascular;  Laterality: N/A;  . LEFT HEART CATHETERIZATION WITH CORONARY/GRAFT ANGIOGRAM N/A 05/15/2013   Procedure: LEFT HEART CATHETERIZATION WITH Beatrix Fetters;  Surgeon: [name], MD;  Location: [facility];  Service: Cardiovascular;  Laterality: N/A;  . PERIPHERAL VASCULAR INTERVENTION  09/23/2016   Procedure: Peripheral Vascular Intervention;  Surgeon: Wellington Hampshire, MD;  Location: [facility];  Service: Cardiovascular;;  Lt. SFA        Home Medications    Prior to Admission medications   Medication Sig Start Date End Date Taking? Authorizing Provider  atorvastatin (LIPITOR) 80 MG tablet TAKE 1 TABLET BY MOUTH DAILY AT 6 PM. Patient taking differently: Take 80 mg by mouth daily at 6 PM.  09/30/18  Yes McAlhany, Annita Brod, MD  clopidogrel (PLAVIX) 75 MG tablet Take one tablet by mouth daily. Please keep appointment in March for further refills 09/30/18  Yes McAlhany, Annita Brod, MD  folic acid (FOLVITE)  1 MG tablet Take 1 tablet (1 mg total) by mouth daily. 01/10/18  Yes [name], [name], MD  isosorbide mononitrate (IMDUR) 60 MG 24 hr tablet Take 1 tablet (60 mg total) by mouth daily. Please make yearly appt with Dr. Angelena Form for February for future refills. 1st attempt 07/12/18  Yes Burnell Blanks, MD  lisinopril (PRINIVIL,ZESTRIL) 5 MG tablet Take 1 tablet (5 mg total) by mouth daily. Please make yearly appt with Dr. Angelena Form for February for future refills. 1st attempt Patient taking differently: Take 5 mg by mouth daily.  07/12/18  Yes Burnell Blanks, MD  metoprolol tartrate (LOPRESSOR) 25 MG tablet Take 1 tablet (25 mg total) by mouth 2 (two) times daily. Please make yearly appt with Dr. Angelena Form for February. 1st attempt 07/12/18  Yes Burnell Blanks, MD  nitroGLYCERIN (NITROSTAT) 0.4 MG SL tablet PLACE 1 TABLET (0.4 MG TOTAL) UNDER THE TONGUE EVERY 5 MINUTES AS NEEDED FOR CHEST PAIN. Patient taking differently: Place 0.4 mg under the tongue  every 5 (five) minutes as needed. PLACE 1 TABLET (0.4 MG TOTAL) UNDER THE TONGUE EVERY 5 MINUTES AS NEEDED FOR CHEST PAIN. 05/10/18  Yes Burnell Blanks, MD  pantoprazole (PROTONIX) 40 MG tablet TAKE 1 TABLET TWICE DAILY BEFORE A MEAL Patient taking differently: Take 40 mg by mouth daily.  09/30/18  Yes Burnell Blanks, MD  aspirin 81 MG chewable tablet Chew 1 tablet (81 mg total) by mouth daily. Patient not taking: Reported on 11/26/2018 07/09/16   Erma Heritage, PA-C  augmented betamethasone dipropionate (DIPROLENE-AF) 0.05 % cream Apply topically 2 (two) times daily. Patient not taking: Reported on 11/26/2018 12/08/17   Colon Branch, MD  fenofibrate (TRICOR) 145 MG tablet Take 1 tablet (145 mg total) by mouth daily. Patient not taking: Reported on 11/26/2018 10/18/17   Burnell Blanks, MD    Family History Family History  Problem Relation Age of Onset  . Heart disease Mother   . Heart failure Mother   .  Diabetes Mother   . Colon cancer Father        late 53s  . Prostate cancer Neg Hx     Social History Social History   Tobacco Use  . Smoking status: Current Every Day Smoker    Packs/day: 1.00    Years: 43.00    Pack years: 43.00    Types: Cigarettes  . Smokeless tobacco: Never Used  Substance Use Topics  . Alcohol use: Yes    Alcohol/week: 14.0 standard drinks    Types: 14 Cans of beer per week    Comment: as off 12/2017: beer 12-pack q week  . Drug use: No    Comment: 09/23/2016 "none in years"     Allergies   Patient has no known allergies.   Review of Systems Review of Systems  Constitutional: Negative for chills and fever.  Eyes: Negative for visual disturbance.  Respiratory: Positive for shortness of breath. Negative for cough.   Cardiovascular: Negative for chest pain.  Gastrointestinal: Negative for abdominal pain, diarrhea, nausea and vomiting.  Genitourinary: Negative for dysuria, flank pain and frequency.  Musculoskeletal: Negative for back pain, myalgias and neck pain.  Skin: Negative for rash and wound.  Neurological: Positive for speech difficulty, weakness and numbness. Negative for dizziness, light-headedness and headaches.  All other systems reviewed and are negative.    Physical Exam Updated Vital Signs BP (!) 167/94   Pulse 76   Temp 98 F (36.7 C) (Oral)   Resp 20   Ht 5\' 11"  (1.803 m)   Wt 103.4 kg   SpO2 95%   BMI 31.80 kg/m   Physical Exam Vitals signs and nursing note reviewed.  Constitutional:      Appearance: Normal appearance. He is well-developed.  HENT:     Head: Normocephalic and atraumatic.     Comments: No noted facial asymmetry    Nose: Nose normal.     Mouth/Throat:     Mouth: Mucous membranes are moist.  Eyes:     Pupils: Pupils are equal, round, and reactive to light.  Neck:     Musculoskeletal: Normal range of motion and neck supple. No neck rigidity or muscular tenderness.  Cardiovascular:     Rate and  Rhythm: Normal rate and regular rhythm.     Heart sounds: No murmur. No friction rub. No gallop.   Pulmonary:     Effort: Pulmonary effort is normal.     Breath sounds: Rales present.     Comments: Few  scattered rhonchi. Abdominal:     General: Bowel sounds are normal. There is no distension.     Palpations: Abdomen is soft. There is no mass.     Tenderness: There is no abdominal tenderness. There is no right CVA tenderness, left CVA tenderness, guarding or rebound.  Musculoskeletal: Normal range of motion.        General: No swelling, tenderness, deformity or signs of injury.     Right lower leg: No edema.     Left lower leg: No edema.  Lymphadenopathy:     Cervical: No cervical adenopathy.  Skin:    General: Skin is warm and dry.     Capillary Refill: Capillary refill takes less than 2 seconds.     Findings: No erythema or rash.  Neurological:     Mental Status: He is alert and oriented to person, place, and time.     Comments: Mild delay in speech.  Patient has 4/5 motor in right upper extremity and right lower extremity.  Decreased sensation to light touch compared to the left.  Bilateral finger-to-nose intact.  Psychiatric:        Behavior: Behavior normal.      ED Treatments / Results  Labs (all labs ordered are listed, but only abnormal results are displayed) Labs Reviewed  CBC - Abnormal; Notable for the following components:      Result Value   Hemoglobin 17.4 (*)    HCT 53.7 (*)    All other components within normal limits  COMPREHENSIVE METABOLIC PANEL - Abnormal; Notable for the following components:   Chloride 95 (*)    Glucose, Bld 104 (*)    Total Bilirubin 2.1 (*)    Anion gap 16 (*)    All other components within normal limits  BRAIN NATRIURETIC PEPTIDE - Abnormal; Notable for the following components:   B Natriuretic Peptide 167.9 (*)    All other components within normal limits  CBG MONITORING, ED - Abnormal; Notable for the following components:    Glucose-Capillary 104 (*)    All other components within normal limits  PROTIME-INR  APTT  DIFFERENTIAL  TROPONIN I  URINALYSIS, ROUTINE W REFLEX MICROSCOPIC    EKG EKG Interpretation  Date/Time:  Saturday November 26 2018 12:16:52 EDT Ventricular Rate:  78 PR Interval:    QRS Duration: 111 QT Interval:  402 QTC Calculation: 458 R Axis:   82 Text Interpretation:  Sinus rhythm Probable left atrial enlargement Borderline repolarization abnormality Confirmed by Julianne Rice [phone]) on 11/26/2018 1:33:36 PM   Radiology Ct Head Wo Contrast  Result Date: 11/26/2018 CLINICAL DATA:  58 year old male with acute RIGHT sided numbness. EXAM: CT HEAD WITHOUT CONTRAST TECHNIQUE: Contiguous axial images were obtained from the base of the skull through the vertex without intravenous contrast. COMPARISON:  02/20/2015 MR and 11/21/2006 CT FINDINGS: Brain: Bilateral basal ganglia lacunar infarcts are age indeterminate. No other evidence of acute infarction, hemorrhage, hydrocephalus, extra-axial collection or mass lesion/mass effect. Atrophy, chronic small-vessel white matter ischemic changes and remote RIGHT cerebellar and LEFT thalamic infarcts noted. Vascular: Atherosclerotic calcifications noted. Skull: Normal. Negative for fracture or focal lesion. Sinuses/Orbits: No acute finding. Other: None. IMPRESSION: 1. Age indeterminate bilateral basal ganglia lacunar infarcts, but most likely subacute to remote. No hemorrhage. 2. Atrophy, chronic small-vessel white matter ischemic changes and remote RIGHT cerebellar and LEFT thalamic infarcts. Electronically Signed   By: Margarette Canada [name]   On: 11/26/2018 13:50   Dg Chest Port 1 View  Result Date: 11/26/2018  CLINICAL DATA:  Shortness of breath EXAM: PORTABLE CHEST 1 VIEW COMPARISON:  01/05/2018 FINDINGS: There is no focal parenchymal opacity. There is no pleural effusion or pneumothorax. There is stable cardiomegaly. There is evidence of prior CABG. The osseous  structures are unremarkable. IMPRESSION: No active disease. Electronically Signed   By: Kathreen Devoid   On: 11/26/2018 14:21    Procedures Procedures (including critical care time)  Medications Ordered in ED Medications  iohexol (OMNIPAQUE) 350 MG/ML injection 80 mL (has no administration in time range)     Initial Impression / Assessment and Plan / ED Course  I have reviewed the triage vital signs and the nursing notes.  Pertinent labs & imaging results that were available during my care of the patient were reviewed by me and considered in my medical decision making (see chart for details).       Discussed with neurology who will consult on the patient.  Recommend getting CT angios head and neck and MRI.  Hospitalist to admit for stroke work-up.   Final Clinical Impressions(s) / ED Diagnoses   Final diagnoses:  Acute ischemic stroke [facility])    ED Discharge Orders    None       Julianne Rice, MD 11/26/18 1446

## 2018-11-26 NOTE — H&P (Signed)
History and Physical    [name]. JME:[id number] DOB: 03-07-1961 DOA: 11/26/2018  Referring MD/NP/PA:   PCP: Colon Branch, MD   Patient coming from:  The patient is coming from home.  At baseline, pt is independent for most of ADL.        Chief Complaint: Right-sided numbness and weakness  HPI: [name]. is a 58 y.o. male with medical history significant of hypertension, hyperlipidemia, diabetes, BPH, tobacco use, OSA, anxiety, depression pancreatitis, PAD, CAD, diastolic heart failure, CVA resented with worsening right-sided weakness, unsteady gait, slurred speech  Per patient, his last known normal was yesterday noon, when he wake up around 1 PM, he found he has difficulty speaking and also have right-sided weakness and numbness, also he had unsteady gait and fell, but not hurt his head.  He said he had some dry cough, no fever no chills, no chest pain, no shortness of breath.  No sick contacts.  He went to the ED today  ED Course: pt was found to have temperature 98, pulse rate 76, respiratory 20, blood pressure 169/94, oxygen saturation 95% on room air basically unremarkable CMP, except total bilirubin 2.1, BNP 167.9, troponin less than 0.03, basically unremarkable CBC except hemoglobin 17.4, PT/INR within normal range, CT head: Age indeterminate bilateral basal ganglia lacunar infarcts, but most likely subacute to remote. No hemorrhage. Atrophy, chronic small-vessel white matter ischemic changes and remote RIGHT cerebellar and LEFT thalamic infarcts. Chest X-ray no acute intrathoracic abnormality. EKG reviewed by me: Sinus rhythm, no significant ischemic change.  Consults neurology, recommends MRI brain, CTA head and neck, echo hospitalist  was requested medication  Review of Systems:   General: no fevers, chills, no body weight gain, has poor appetite, has fatigue HEENT: no blurry vision, hearing changes or sore throat Respiratory: no dyspnea, wheezing CV:  no chest pain, no palpitations GI: no nausea, vomiting, abdominal pain, diarrhea, constipation GU: no dysuria, burning on urination, increased urinary frequency, hematuria  Ext: no leg edema Neuro:  or tingling, no vision change or hearing loss Skin: no rash, no skin tear. MSK: No muscle spasm, no deformity, no limitation of range of movement in spin Heme: No easy bruising.  Travel history: No recent long distant travel.  Allergy: No Known Allergies  Past Medical History:  Diagnosis Date  . Abnormality of gait   . Anxiety   . CAD (coronary artery disease)    a. 10/2008 Inferolateral STEMI: 3VD w/ occluded LCX (PTCA)-->CABG x 3 (LIMA->LAD, VG->OM, VG->PDA);  b. 06/2009 NSTEMI/PCI: VG->OM 100 (BMS);  c. 05/2013 native 3VD, 3/3 patent grafts. d. 06/2016: Inferior STEMI w/ occluded SVG-OM (DES placed). LIMA-LAD and SVG-PDA patent.  . Chronic bronchitis ([place])   . Chronic diastolic CHF (congestive heart failure) ([place])    a. 02/2015 Echo: EF 50-55%, distal septal, apical, inferobasal HK, mild LVH, mild MR, mildly dil LA.  [name] Cocaine use   . Colon polyps    a. 09/2015 colonscopy: multiple sessile polyps, path negative for high grade dysplasia.  . Depression   . Emphysema (subcutaneous) (surgical) resulting from a procedure 10/2008.   Bleb resected at time of 10/2008 CABG. Marketed emphysema noted on surgical report.  . Esophagitis    a. 2017 EGD: esophagitis, duodenitis.  [name] ETOH abuse   . GERD (gastroesophageal reflux disease)   . Hemianopia, homonymous, right    "can't see out the right side of either eye since stroke in 2016/pt description 09/23/2016  . Hepatic steatosis   .  Hiatal hernia   . History of nuclear stress test 08/2017   Nuclear stress test 1/19: EF 35, inf-lat and ant-lat, apical sept/apical scar; no ischemia; Intermediate Risk  . Hyperlipidemia   . Hypertension   . Iron deficiency anemia 2010  . Morbid obesity ([place])   . Myocardial infarction ([place]) "several"  . PAD  (peripheral artery disease) ([place])   . Pancreatitis 06/2015  . Sleep apnea    "don't have a mask" (09/23/2016)  . Stroke ([place]) 02/2015   a. 02/2015 Carotid U/S: 1-39% bilat ICA stenosis; "left me blind" (09/23/2016)  . Tobacco abuse    still smokes  . Tubular adenoma of colon     Past Surgical History:  Procedure Laterality Date  . ABDOMINAL AORTOGRAM W/LOWER EXTREMITY N/A 09/23/2016   Procedure: Abdominal Aortogram w/Lower Extremity;  Surgeon: Wellington Hampshire, MD;  Location: [facility];  Service: Cardiovascular;  Laterality: N/A;  . CARDIAC CATHETERIZATION  11/07/2008   EF of 50-55% -- normal LV systolic function, acute inferolateral ST elevation MI,  PTCA of the circumflex artery -- [name]. [name], [name]  . CARDIAC CATHETERIZATION N/A 07/07/2016   Procedure: Left Heart Cath and Cors/Grafts Angiography;  Surgeon: [name], MD;  Location: [facility];  Service: Cardiovascular;  Laterality: N/A;  . CARDIAC CATHETERIZATION N/A 07/07/2016   Procedure: Coronary Stent Intervention;  Surgeon: [name], MD;  Location: [facility];  Service: Cardiovascular;  Laterality: N/A;  . CORONARY ANGIOPLASTY    . CORONARY ARTERY BYPASS GRAFT     "CABG X4"  . LEFT HEART CATH AND CORS/GRAFTS ANGIOGRAPHY N/A 10/01/2017   Procedure: LEFT HEART CATH AND CORS/GRAFTS ANGIOGRAPHY;  Surgeon: Burnell Blanks, MD;  Location: [facility];  Service: Cardiovascular;  Laterality: N/A;  . LEFT HEART CATHETERIZATION WITH CORONARY/GRAFT ANGIOGRAM N/A 05/15/2013   Procedure: LEFT HEART CATHETERIZATION WITH Beatrix Fetters;  Surgeon: [name], MD;  Location: [facility];  Service: Cardiovascular;  Laterality: N/A;  . PERIPHERAL VASCULAR INTERVENTION  09/23/2016   Procedure: Peripheral Vascular Intervention;  Surgeon: Wellington Hampshire, MD;  Location: [place] [facility];  Service: Cardiovascular;;  Lt. SFA    Social History:  reports that he has been smoking  cigarettes. He has a 43.00 pack-year smoking history. He has never used smokeless tobacco. He reports current alcohol use of about 14.0 standard drinks of alcohol per week. He reports that he does not use drugs.  Family History:  Family History  Problem Relation Age of Onset  . Heart disease Mother   . Heart failure Mother   . Diabetes Mother   . Colon cancer Father        late 37s  . Prostate cancer Neg Hx      Prior to Admission medications   Medication Sig Start Date End Date Taking? Authorizing Provider  aspirin 81 MG chewable tablet Chew 1 tablet (81 mg total) by mouth daily. 07/09/16   Strader, Fransisco Hertz, PA-C  atorvastatin (LIPITOR) 80 MG tablet TAKE 1 TABLET BY MOUTH DAILY AT 6 PM. 09/30/18   Burnell Blanks, MD  augmented betamethasone dipropionate (DIPROLENE-AF) 0.05 % cream Apply topically 2 (two) times daily. 12/08/17   Colon Branch, MD  cholecalciferol (VITAMIN D) 1000 units tablet Take 1,000 Units by mouth daily.    [provider]  clopidogrel (PLAVIX) 75 MG tablet Take one tablet by mouth daily. Please keep appointment in March for further refills 09/30/18  Burnell Blanks, MD  fenofibrate (TRICOR) 145 MG tablet Take 1 tablet (145 mg total) by mouth daily. 10/18/17   Burnell Blanks, MD  folic acid (FOLVITE) 1 MG tablet Take 1 tablet (1 mg total) by mouth daily. 01/10/18   Colon Branch, MD  isosorbide mononitrate (IMDUR) 60 MG 24 hr tablet Take 1 tablet (60 mg total) by mouth daily. Please make yearly appt with Dr. Angelena Form for February for future refills. 1st attempt 07/12/18   Burnell Blanks, MD  lisinopril (PRINIVIL,ZESTRIL) 5 MG tablet Take 1 tablet (5 mg total) by mouth daily. Please make yearly appt with Dr. Angelena Form for February for future refills. 1st attempt 07/12/18   Burnell Blanks, MD  metoprolol tartrate (LOPRESSOR) 25 MG tablet Take 1 tablet (25 mg total) by mouth 2 (two) times daily. Please make yearly appt with Dr.  Angelena Form for February. 1st attempt 07/12/18   Burnell Blanks, MD  nitroGLYCERIN (NITROSTAT) 0.4 MG SL tablet PLACE 1 TABLET (0.4 MG TOTAL) UNDER THE TONGUE EVERY 5 MINUTES AS NEEDED FOR CHEST PAIN. 05/10/18   Burnell Blanks, MD  pantoprazole (PROTONIX) 40 MG tablet TAKE 1 TABLET TWICE DAILY BEFORE A MEAL 09/30/18   Burnell Blanks, MD    Physical Exam: Vitals:   11/26/18 1215 11/26/18 1330 11/26/18 1410 11/26/18 1415  BP: (!) 181/103  (!) 171/87 (!) 167/94  Pulse: 74 72 74 76  Resp: 16 (!) 21 17 20   Temp: 98 F (36.7 C)     TempSrc: Oral     SpO2: 98% 96% 96% 95%  Weight: 103.4 kg     Height: 5\' 11"  (1.803 m)      General: Not in acute distress HEENT:       Eyes: PERRL, EOMI, no scleral icterus.       ENT: No discharge from the ears and nose, no pharynx injection, no tonsillar enlargement.        Neck: No JVD, no bruit, no mass felt. Heme: No neck lymph node enlargement. Cardiac: S1/S2, RRR, No murmurs, No gallops or rubs. Respiratory: Good air movement bilaterally. No rales, wheezing, rhonchi or rubs. GI: Soft, nondistended, nontender, no rebound pain, no organomegaly, BS present. GU: No hematuria Ext: No pitting leg edema bilaterally. 2+DP/PT pulse bilaterally. Musculoskeletal: No joint deformities, No joint redness or warmth, no limitation of ROM in spin. Skin: No rashes.  Neuro: Alert, oriented X3, cranial nerves II-XII grossly intact, right leg weakness 5-/5  Psych: Patient is not psychotic, no suicidal or hemocidal ideation.  Labs on Admission: I have personally reviewed following labs and imaging studies  CBC: Recent Labs  Lab 11/26/18 1222  WBC 7.7  NEUTROABS 5.4  HGB 17.4*  HCT 53.7*  MCV 98.5  PLT 144   Basic Metabolic Panel: Recent Labs  Lab 11/26/18 1222  NA 135  K 4.6  CL 95*  CO2 24  GLUCOSE 104*  BUN 9  CREATININE 0.91  CALCIUM 9.4   GFR: Estimated Creatinine Clearance: 110.9 mL/min (by C-G formula based on SCr of  0.91 mg/dL). Liver Function Tests: Recent Labs  Lab 11/26/18 1222  AST 22  ALT 19  ALKPHOS 88  BILITOT 2.1*  PROT 7.8  ALBUMIN 3.9   No results for input(s): LIPASE, AMYLASE in the last 168 hours. No results for input(s): AMMONIA in the last 168 hours. Coagulation Profile: Recent Labs  Lab 11/26/18 1222  INR 1.1   Cardiac Enzymes: Recent Labs  Lab 11/26/18 1222  TROPONINI <0.03   BNP (last 3 results) No results for input(s): PROBNP in the last 8760 hours. HbA1C: No results for input(s): HGBA1C in the last 72 hours. CBG: Recent Labs  Lab 11/26/18 1214  GLUCAP 104*   Lipid Profile: No results for input(s): CHOL, HDL, LDLCALC, TRIG, CHOLHDL, LDLDIRECT in the last 72 hours. Thyroid Function Tests: No results for input(s): TSH, T4TOTAL, FREET4, T3FREE, THYROIDAB in the last 72 hours. Anemia Panel: No results for input(s): VITAMINB12, FOLATE, FERRITIN, TIBC, IRON, RETICCTPCT in the last 72 hours. Urine analysis:    Component Value Date/Time   COLORURINE YELLOW 01/05/2018 1506   APPEARANCEUR Sl Cloudy (A) 01/05/2018 1506   LABSPEC 1.015 01/05/2018 1506   PHURINE 6.5 01/05/2018 1506   GLUCOSEU NEGATIVE 01/05/2018 1506   HGBUR TRACE-INTACT (A) 01/05/2018 1506   BILIRUBINUR NEGATIVE 01/05/2018 1506   KETONESUR NEGATIVE 01/05/2018 1506   PROTEINUR NEGATIVE 08/06/2015 2029   UROBILINOGEN 4.0 (A) 01/05/2018 1506   NITRITE NEGATIVE 01/05/2018 1506   LEUKOCYTESUR SMALL (A) 01/05/2018 1506   Sepsis Labs: @LABRCNTIP (procalcitonin:4,lacticidven:4) )No results found for this or any previous visit (from the past 240 hour(s)).   Radiological Exams on Admission: Ct Head Wo Contrast  Result Date: 11/26/2018 CLINICAL DATA:  58 year old male with acute RIGHT sided numbness. EXAM: CT HEAD WITHOUT CONTRAST TECHNIQUE: Contiguous axial images were obtained from the base of the skull through the vertex without intravenous contrast. COMPARISON:  02/20/2015 MR and 11/21/2006 CT  FINDINGS: Brain: Bilateral basal ganglia lacunar infarcts are age indeterminate. No other evidence of acute infarction, hemorrhage, hydrocephalus, extra-axial collection or mass lesion/mass effect. Atrophy, chronic small-vessel white matter ischemic changes and remote RIGHT cerebellar and LEFT thalamic infarcts noted. Vascular: Atherosclerotic calcifications noted. Skull: Normal. Negative for fracture or focal lesion. Sinuses/Orbits: No acute finding. Other: None. IMPRESSION: 1. Age indeterminate bilateral basal ganglia lacunar infarcts, but most likely subacute to remote. No hemorrhage. 2. Atrophy, chronic small-vessel white matter ischemic changes and remote RIGHT cerebellar and LEFT thalamic infarcts. Electronically Signed   By: Margarette Canada [name]   On: 11/26/2018 13:50   Dg Chest Port 1 View  Result Date: 11/26/2018 CLINICAL DATA:  Shortness of breath EXAM: PORTABLE CHEST 1 VIEW COMPARISON:  01/05/2018 FINDINGS: There is no focal parenchymal opacity. There is no pleural effusion or pneumothorax. There is stable cardiomegaly. There is evidence of prior CABG. The osseous structures are unremarkable. IMPRESSION: No active disease. Electronically Signed   By: Kathreen Devoid   On: 11/26/2018 14:21     EKG: Independently reviewed.  Not done in ED, will get one.   Assessment/Plan Principal Problem:   CVA (cerebral vascular accident) ([place])   Principal Problem:   CVA (cerebral vascular accident) ([place])  [name]. is a 58 y.o. male with medical history significant of hypertension, hyperlipidemia, diabetes, BPH, tobacco use, OSA, anxiety, depression pancreatitis, PAD, CAD, diastolic heart failure, CVA resented with worsening right-sided weakness, unsteady gait, slurred speech   TIA vs stroke:  E/o CT.  Other differential diagnosis include, Cocaine overdose (patient denies drug use). Presented with right-sided weakness, slurred speech.  Now only right lower extremity weakness H/o CVA  previously Neurology consulted by EDP Not candidate for tPA or thrombectomy per Neurology Head CT see above.  Head MRI pending CTA head and neck pending Echo pending HbA1c and fasting lipid panel pending Tele monitoring Neuro check Q4h Aspiration and Fall Precautions. Bedside swallow screen and advance diet as tolerated if no aspiration. PT/OT/ST consultations. Aspirin and statin Permissive  hypertension if acute stroke confirmed by MRI (will treat if > 220/120 mmHg)  -Risk factor stratification and management: Smoking cessation is a priority; plan is to counsel and assist with smoking cessation.  Better control of her hyperlipidemia, with a target LDL of 70 or below.    #history significant of hypertension, hyperlipidemia, diabetes, BPH, tobacco use, OSA, anxiety, depression pancreatitis, PAD, CAD, diastolic heart failure, CVA:  Established Active problems see above Continue home medications if appropriate follow-up with his PCP, specialist when necessary  DVT ppx: scd Code Status: Full code Family Communication: None at bed side.              Disposition Plan:  Anticipate discharge back to previous home environment Consults called:   Admission status: Obs / tele  Inpatient/tele   medical floor/obs     SDU/inpation       Date of Service 11/26/2018    Lenna Sciara [facility]   If 7PM-7AM, please contact night-coverage [email or web address] Password [facility] 11/26/2018, 2:39 PM

## 2018-11-26 NOTE — ED Notes (Signed)
Patient transported to CT 

## 2018-11-26 NOTE — ED Notes (Signed)
Urine Culture sent to Main Lab with UA 

## 2018-11-26 NOTE — ED Notes (Signed)
Patient transported to MRI 

## 2018-11-26 NOTE — ED Triage Notes (Signed)
Son stated, last normal yesterday with slurred speech, he has had numerous strokes per pt.   Son stated, his dad stated it will probably pass it did not. And numb on left side.  Neg arm drift symetrical smile Slurred speech,  Pt. In wheelchair from truck, did not ambulate.

## 2018-11-26 NOTE — ED Triage Notes (Addendum)
Patient reports increase in R sided numbness and weakness since yesterday. LKW  11/25/18 at 1200 when he laid down for nap - upon waking about 1300, he noticed his R side felt weaker and more numb as well as new slurred speech and "trouble talking." He endorses history of multiple CVAs, resulting in residual R sided weakness and numbness and decreased vision in R eye (no peripheral vision in R eye per patient). He thought symptoms would initially go away. Also reports "feeling claustrophobic, like he can't breathe." Patient respirations appear to be labored - tachypneic, noisy - but he states he always breathes like this.   Patient A&O x 4. R leg drift noted as well as decreased sensation in R side compared to L side. Slurred speech noted as well - no apparent aphasia, but he states he sometimes can't find the right word.

## 2018-11-26 NOTE — Consult Note (Addendum)
NEURO HOSPITALIST  CONSULT   Requesting Physician: Dr. Lita Mains    Chief Complaint: Numbness  History obtained from:  Patient     HPI:                                                                                                                                         [name]. is an 58 y.o. male  With PMH MI, morbid obesity, CVA (2016, 2008 w/ residual right side motor and sensory deficits following the 1st stroke, residual right homonymous hemianopsia following the 2nd stroke), HTN, HLD, CHF, CAD, ETOH and cocaine abuse who presented to [facility] ED with c/o new onset slurred speech and left sided numbness.  Patient states that he had some right sided numbness and weakness that started yesterday. He did initially call EMS, but then refused transport to the hospital yesterday. He thought that it would pass, but it did not. He laid down for a nap at 1200 and when he woke up at 1300 he noticed that the right side felt weaker and more numb. He also noticed some slurred speech and reports trouble talking. He has had multiple previous CVA's with residual right side numbness, weakness and decreased vision in the right eye. Patient takes ASA and Plavix daily, denies missing any doses. Patient smokes 2 packs of cigarettes (that he rolls himself) per day and drinks 4 beers per day. Patient has emphysema and is SOB at baseline, which he states has been getting worse, but patient is not currently on a home O2 regimen.   ED course:  BP: 181/103 BG:104,   CTH: bilateral BG lacunar infarcts, no hemorrhage, remote right cerebellar and left thalamic infarcts CTA: no LVO, right vertebral artery stenosis, mild ICAD, cervical carotid artery with atherosclerosis w/o stenosis.  MRI: pending   02/2015: left lateral thalamus inferiorly to the region of the left optic radiations, small lacunar infarct posterior left corona radiata 2008: right cerebellar infarcts,  CVA with residual right side weakness.  Date last known well: 11/25/2018 Time last known well: unknown tPA Given: No: outside of the window  Modified Rankin: Rankin Score=1  NIHSS: 5 1a Level of Conscious:0 1b LOC Questions: 0 1c LOC Commands: 0 2 Best Gaze: 0 3 Visual: 1 4 Facial Palsy: 0 5a Motor Arm - left: 0 5b Motor Arm - Right: 1 6a Motor Leg - Left: 0 6b Motor Leg - Right: 1 7 Limb Ataxia:0  8 Sensory: 1 9 Best Language: 0 10 Dysarthria:1 11 Extinct. and Inattention:0 TOTAL: 5   Past Medical History:  Diagnosis Date  . Abnormality of gait   .  Anxiety   . CAD (coronary artery disease)    a. 10/2008 Inferolateral STEMI: 3VD w/ occluded LCX (PTCA)-->CABG x 3 (LIMA->LAD, VG->OM, VG->PDA);  b. 06/2009 NSTEMI/PCI: VG->OM 100 (BMS);  c. 05/2013 native 3VD, 3/3 patent grafts. d. 06/2016: Inferior STEMI w/ occluded SVG-OM (DES placed). LIMA-LAD and SVG-PDA patent.  . Chronic bronchitis ([place])   . Chronic diastolic CHF (congestive heart failure) ([place])    a. 02/2015 Echo: EF 50-55%, distal septal, apical, inferobasal HK, mild LVH, mild MR, mildly dil LA.  [name] Cocaine use   . Colon polyps    a. 09/2015 colonscopy: multiple sessile polyps, path negative for high grade dysplasia.  . Depression   . Emphysema (subcutaneous) (surgical) resulting from a procedure 10/2008.   Bleb resected at time of 10/2008 CABG. Marketed emphysema noted on surgical report.  . Esophagitis    a. 2017 EGD: esophagitis, duodenitis.  [name] ETOH abuse   . GERD (gastroesophageal reflux disease)   . Hemianopia, homonymous, right    "can't see out the right side of either eye since stroke in 2016/pt description 09/23/2016  . Hepatic steatosis   . Hiatal hernia   . History of nuclear stress test 08/2017   Nuclear stress test 1/19: EF 35, inf-lat and ant-lat, apical sept/apical scar; no ischemia; Intermediate Risk  . Hyperlipidemia   . Hypertension   . Iron deficiency anemia 2010  . Morbid obesity ([place])   .  Myocardial infarction ([place]) "several"  . PAD (peripheral artery disease) ([place])   . Pancreatitis 06/2015  . Sleep apnea    "don't have a mask" (09/23/2016)  . Stroke ([place]) 02/2015   a. 02/2015 Carotid U/S: 1-39% bilat ICA stenosis; "left me blind" (09/23/2016)  . Tobacco abuse    still smokes  . Tubular adenoma of colon     Past Surgical History:  Procedure Laterality Date  . ABDOMINAL AORTOGRAM W/LOWER EXTREMITY N/A 09/23/2016   Procedure: Abdominal Aortogram w/Lower Extremity;  Surgeon: Wellington Hampshire, MD;  Location: [place] [facility];  Service: Cardiovascular;  Laterality: N/A;  . CARDIAC CATHETERIZATION  11/07/2008   EF of 50-55% -- normal LV systolic function, acute inferolateral ST elevation MI,  PTCA of the circumflex artery -- [name]. [name], [name]  . CARDIAC CATHETERIZATION N/A 07/07/2016   Procedure: Left Heart Cath and Cors/Grafts Angiography;  Surgeon: [name], MD;  Location: [place] [facility];  Service: Cardiovascular;  Laterality: N/A;  . CARDIAC CATHETERIZATION N/A 07/07/2016   Procedure: Coronary Stent Intervention;  Surgeon: [name], MD;  Location: [place] [facility];  Service: Cardiovascular;  Laterality: N/A;  . CORONARY ANGIOPLASTY    . CORONARY ARTERY BYPASS GRAFT     "CABG X4"  . LEFT HEART CATH AND CORS/GRAFTS ANGIOGRAPHY N/A 10/01/2017   Procedure: LEFT HEART CATH AND CORS/GRAFTS ANGIOGRAPHY;  Surgeon: Burnell Blanks, MD;  Location: [place] [facility];  Service: Cardiovascular;  Laterality: N/A;  . LEFT HEART CATHETERIZATION WITH CORONARY/GRAFT ANGIOGRAM N/A 05/15/2013   Procedure: LEFT HEART CATHETERIZATION WITH Beatrix Fetters;  Surgeon: [name], MD;  Location: [facility];  Service: Cardiovascular;  Laterality: N/A;  . PERIPHERAL VASCULAR INTERVENTION  09/23/2016   Procedure: Peripheral Vascular Intervention;  Surgeon: Wellington Hampshire, MD;  Location: [place] [facility];  Service: Cardiovascular;;  Lt. SFA    Family  History  Problem Relation Age of Onset  . Heart disease Mother   . Heart failure Mother   . Diabetes Mother   .  Colon cancer Father        late 109s  . Prostate cancer Neg Hx          Social History:  reports that he has been smoking cigarettes. He has a 43.00 pack-year smoking history. He has never used smokeless tobacco. He reports current alcohol use of about 14.0 standard drinks of alcohol per week. He reports that he does not use drugs.  Allergies: No Known Allergies  Medications:                                                                                                                           No current facility-administered medications for this encounter.    Current Outpatient Medications  Medication Sig Dispense Refill  . aspirin 81 MG chewable tablet Chew 1 tablet (81 mg total) by mouth daily.    [name] atorvastatin (LIPITOR) 80 MG tablet TAKE 1 TABLET BY MOUTH DAILY AT 6 PM. 90 tablet 0  . augmented betamethasone dipropionate (DIPROLENE-AF) 0.05 % cream Apply topically 2 (two) times daily. 60 g 0  . cholecalciferol (VITAMIN D) 1000 units tablet Take 1,000 Units by mouth daily.    . clopidogrel (PLAVIX) 75 MG tablet Take one tablet by mouth daily. Please keep appointment in March for further refills 90 tablet 0  . fenofibrate (TRICOR) 145 MG tablet Take 1 tablet (145 mg total) by mouth daily. 90 tablet 3  . folic acid (FOLVITE) 1 MG tablet Take 1 tablet (1 mg total) by mouth daily. 90 tablet 3  . isosorbide mononitrate (IMDUR) 60 MG 24 hr tablet Take 1 tablet (60 mg total) by mouth daily. Please make yearly appt with Dr. Angelena Form for February for future refills. 1st attempt 90 tablet 0  . lisinopril (PRINIVIL,ZESTRIL) 5 MG tablet Take 1 tablet (5 mg total) by mouth daily. Please make yearly appt with Dr. Angelena Form for February for future refills. 1st attempt 90 tablet 0  . metoprolol tartrate (LOPRESSOR) 25 MG tablet Take 1 tablet (25 mg total) by mouth 2 (two) times daily.  Please make yearly appt with Dr. Angelena Form for February. 1st attempt 180 tablet 0  . nitroGLYCERIN (NITROSTAT) 0.4 MG SL tablet PLACE 1 TABLET (0.4 MG TOTAL) UNDER THE TONGUE EVERY 5 MINUTES AS NEEDED FOR CHEST PAIN. 100 tablet 0  . pantoprazole (PROTONIX) 40 MG tablet TAKE 1 TABLET TWICE DAILY BEFORE A MEAL 180 tablet 0     ROS:  ROS was performed and is negative except as noted in HPI   General Examination:                                                                                                      Blood pressure (!) 181/103, pulse 74, temperature 98 F (36.7 C), temperature source Oral, resp. rate 16, height 5\' 11"  (1.803 m), weight 103.4 kg, SpO2 98 %.  HEENT-  Normocephalic, no lesions, without obvious abnormality.  Normal external eye and conjunctiva. Cardiovascular- S1-S2 audible, pulses palpable throughout  Lungs-no rhonchi or wheezing noted, increased work of breathing.   Extremities- Warm, dry and intact Musculoskeletal-no joint tenderness, deformity or swelling Skin-warm and dry, no hyperpigmentation, vitiligo, or suspicious lesions  Neurological Examination Mental Status: Alert, oriented to name/age/month/year/president, thought content appropriate.  Speech fluent without evidence of aphasia. Mild dysarthria noted. naming intact. Able to follow commands without difficulty. Cranial Nerves: II: right homonymous hemaniopia  III,IV, VI: ptosis not present, extra-ocular motions intact bilaterally, pupils equal, round, reactive to light and accommodation V,VII: smile symmetric, facial light touch sensation decreased on right side. VIII: hearing normal bilaterally IX,X: uvula rises midline XI: bilateral shoulder shrug XII: midline tongue extension Motor: Right : Upper extremity   4/5  Left:     Upper extremity   5/5  Lower extremity    4/5   Lower extremity   5/5 Tone and bulk: Mildly increased tone RUE and RLE. No atrophy noted Sensory:  light touch decreased on right side. Hyperesthesia to temperature RUE and RLE Deep Tendon Reflexes: 2+ and symmetric biceps and patellae Plantars: Right: downgoing   Left: downgoing Cerebellar: normal finger-to-nose,  normal heel-to-shin test Gait: deferred  Lab Results: Basic Metabolic Panel: Recent Labs  Lab 11/26/18 1222  NA 135  K 4.6  CL 95*  CO2 24  GLUCOSE 104*  BUN 9  CREATININE 0.91  CALCIUM 9.4    CBC: Recent Labs  Lab 11/26/18 1222  WBC 7.7  NEUTROABS 5.4  HGB 17.4*  HCT 53.7*  MCV 98.5  PLT 165     CBG: Recent Labs  Lab 11/26/18 1214  GLUCAP 104*    Imaging: Ct Head Wo Contrast  Result Date: 11/26/2018 CLINICAL DATA:  58 year old male with acute RIGHT sided numbness. EXAM: CT HEAD WITHOUT CONTRAST TECHNIQUE: Contiguous axial images were obtained from the base of the skull through the vertex without intravenous contrast. COMPARISON:  02/20/2015 MR and 11/21/2006 CT FINDINGS: Brain: Bilateral basal ganglia lacunar infarcts are age indeterminate. No other evidence of acute infarction, hemorrhage, hydrocephalus, extra-axial collection or mass lesion/mass effect. Atrophy, chronic small-vessel white matter ischemic changes and remote RIGHT cerebellar and LEFT thalamic infarcts noted. Vascular: Atherosclerotic calcifications noted. Skull: Normal. Negative for fracture or focal lesion. Sinuses/Orbits: No acute finding. Other: None. IMPRESSION: 1. Age indeterminate bilateral basal ganglia lacunar infarcts, but most likely subacute to remote. No hemorrhage. 2. Atrophy, chronic small-vessel white matter ischemic changes and remote RIGHT cerebellar and LEFT thalamic infarcts. Electronically Signed   By: Margarette Canada [name]   On: 11/26/2018 13:50  Laurey Morale, MSN, NP-C [facility] [phone] 11/26/2018, 12:56 PM    Assessment: 58 y.o. male  With PMHx of MI, morbid obesity, CVA (2016, 2008 w/ residual right side deficits), HTN, HLD, CHF, CAD, right homonymous hemianopsia,  ETOH and cocaine abuse who presented to [facility] ED with c/o slurred speech, left sided numbness.  1. Exam reveals right hemiparesis, hypoesthesia to FT on right, hyperesthesia to temp on right, as well as right homonymous hemianopsia. Deficits are a combination of sequelae from old strokes +/- new deficit from possible new stroke.  2. CT head: Age indeterminate bilateral basal ganglia lacunar infarcts, but most likely subacute to remote. No hemorrhage. Atrophy, chronic small-vessel white matter ischemic changes and remote RIGHT cerebellar and LEFT thalamic infarcts. 3. CTA: no LVO. 4. No TPA indicated due to patient presenting outside of the time window.  5. Will need complete stroke work up.  6. Stroke Risk Factors - CAD, prior strokes, CHF, cocaine use, PAD, OSA, hyperlipidemia, hypertension and smoking    Recommendations: -- BP goal : Permissive HTN upto 220/110 mmHg (for 24-48 post admission )   -- MRI Brain  -- CTA head and neck  -- Echocardiogram -- Continue ASA and Plavix. -- Continue statin -- HgbA1c, fasting lipid panel -- PT consult, OT consult, Speech consult -- Telemetry monitoring -- Frequent neuro checks -- Stroke swallow screen  -- Smoking cessation -- Please page stroke NP  Or  PA  Or MD from 8am -4 pm  as this patient from this time will be  followed by the stroke.   You can look them up on [email or web address]  Password [email or web address]  I have seen and examined the patient. I have formulated the assessment and plan. 58 year old male with PMHx of multiple strokes, presenting with worsening right sided deficits. Exam reveals right hemiparesis and right hemianopsia. Will obtain stroke work up and continue on ASA, Plavix and statin.  Electronically signed: Dr. Kerney Elbe

## 2018-11-26 NOTE — ED Notes (Signed)
Son's name Levorn Oleski phone- [phone]

## 2018-11-26 NOTE — ED Notes (Signed)
Daughter in laws # Raysean Graumann [phone]

## 2018-11-27 ENCOUNTER — Inpatient Hospital Stay (HOSPITAL_COMMUNITY): Payer: Medicare HMO

## 2018-11-27 DIAGNOSIS — I340 Nonrheumatic mitral (valve) insufficiency: Secondary | ICD-10-CM

## 2018-11-27 DIAGNOSIS — I361 Nonrheumatic tricuspid (valve) insufficiency: Secondary | ICD-10-CM

## 2018-11-27 DIAGNOSIS — I10 Essential (primary) hypertension: Secondary | ICD-10-CM

## 2018-11-27 LAB — ECHOCARDIOGRAM COMPLETE BUBBLE STUDY
Height: 71 in
Weight: 3648 oz

## 2018-11-27 LAB — LIPID PANEL
Cholesterol: 141 mg/dL (ref 0–200)
HDL: 33 mg/dL — ABNORMAL LOW (ref >40)
LDL Cholesterol: 73 mg/dL (ref 0–99)
Total CHOL/HDL Ratio: 4.3 RATIO
Triglycerides: 175 mg/dL — ABNORMAL HIGH (ref <150)
VLDL: 35 mg/dL (ref 0–40)

## 2018-11-27 LAB — HIV ANTIBODY (ROUTINE TESTING W REFLEX): HIV Screen 4th Generation wRfx: NONREACTIVE

## 2018-11-27 LAB — HEMOGLOBIN A1C
Hgb A1c MFr Bld: 6.3 % — ABNORMAL HIGH (ref 4.8–5.6)
Mean Plasma Glucose: 134.11 mg/dL

## 2018-11-27 MED ORDER — ASPIRIN EC 81 MG PO TBEC
81.0000 mg | DELAYED_RELEASE_TABLET | Freq: Every day | ORAL | Status: DC
  Administered 2018-11-27 – 2018-11-28 (×2): 81 mg via ORAL
  Filled 2018-11-27 (×2): qty 1

## 2018-11-27 MED ORDER — FOLIC ACID 1 MG PO TABS: 1.0000 mg | ORAL_TABLET | Freq: Every day | ORAL | Status: DC

## 2018-11-27 MED ORDER — HEPARIN SODIUM (PORCINE) 5000 UNIT/ML IJ SOLN
5000.0000 [IU] | Freq: Three times a day (TID) | INTRAMUSCULAR | Status: DC
  Administered 2018-11-27 – 2018-11-28 (×2): 5000 [IU] via SUBCUTANEOUS
  Filled 2018-11-27 (×2): qty 1

## 2018-11-27 MED ORDER — LORAZEPAM 1 MG PO TABS: 1.0000 mg | ORAL_TABLET | Freq: Four times a day (QID) | ORAL | Status: DC | PRN

## 2018-11-27 MED ORDER — PERFLUTREN LIPID MICROSPHERE
1.0000 mL | INTRAVENOUS | Status: AC | PRN
  Administered 2018-11-27: 09:00:00 2 mL via INTRAVENOUS
  Filled 2018-11-27: qty 10

## 2018-11-27 MED ORDER — THIAMINE HCL 100 MG/ML IJ SOLN: 100.0000 mg | Freq: Every day | INTRAMUSCULAR | Status: DC

## 2018-11-27 MED ORDER — VITAMIN B-1 100 MG PO TABS
100.0000 mg | ORAL_TABLET | Freq: Every day | ORAL | Status: DC
  Administered 2018-11-27 – 2018-11-28 (×2): 100 mg via ORAL
  Filled 2018-11-27 (×2): qty 1

## 2018-11-27 MED ORDER — ADULT MULTIVITAMIN W/MINERALS CH
1.0000 | ORAL_TABLET | Freq: Every day | ORAL | Status: DC
  Administered 2018-11-27 – 2018-11-28 (×2): 1 via ORAL
  Filled 2018-11-27 (×2): qty 1

## 2018-11-27 MED ORDER — TRAZODONE HCL 50 MG PO TABS
50.0000 mg | ORAL_TABLET | Freq: Every day | ORAL | Status: DC
  Administered 2018-11-27: 22:00:00 50 mg via ORAL
  Filled 2018-11-27: qty 1

## 2018-11-27 MED ORDER — LABETALOL HCL 5 MG/ML IV SOLN: 10.0000 mg | INTRAVENOUS | Status: DC | PRN

## 2018-11-27 MED ORDER — LORAZEPAM 2 MG/ML IJ SOLN: 1.0000 mg | Freq: Four times a day (QID) | INTRAMUSCULAR | Status: DC | PRN

## 2018-11-27 NOTE — Evaluation (Signed)
Physical Therapy Evaluation Patient Details Name: [name]. MRN: [id number] DOB: [birth year] Today's Date: 11/27/2018   History of Present Illness  [name]. is an 58 y.o. male  With PMH MI, morbid obesity, CVA (2016, 2008 w/ residual right side motor and sensory deficits following the 1st stroke, residual right homonymous hemianopsia following the 2nd stroke), HTN, HLD, CHF, CAD, ETOH and cocaine abuse who presented to [facility] ED with c/o new onset slurred speech and right sided numbness. CT showed new lacunar basal ganglia infarcts. Of note also, pt with emphysema with SOB at baseline, not on supplemental O2 at home.   Clinical Impression  Pt admitted with above diagnosis. Pt currently with functional limitations due to the deficits listed below (see PT Problem List). Pt with new R sided numbness and apraxia as well as decreased safety awareness. Required min A +2 to stand and ambulate 125' with RW due to dragging of R foot and poor coordination as well as R knee instability. Not safe to be home alone at this point. Pt reports that son is supportive.  Pt will benefit from skilled PT to increase their independence and safety with mobility to allow discharge to the venue listed below.       Follow Up Recommendations CIR;Supervision/Assistance - 24 hour    Equipment Recommendations  Rolling walker with 5" wheels    Recommendations for Other Services Rehab consult;OT consult     Precautions / Restrictions Precautions Precautions: Fall Precaution Comments: fell prior to coming in landing on both knees but R worse than L Restrictions Weight Bearing Restrictions: No      Mobility  Bed Mobility Overal bed mobility: Needs Assistance Bed Mobility: Supine to Sit     Supine to sit: Min assist     General bed mobility comments: pt able to remove sheets and get to EOB with min HHA. vc's for breathing as he tends to hold breath  Transfers Overall transfer level: Needs  assistance Equipment used: Rolling walker (2 wheeled) Transfers: Sit to/from Stand Sit to Stand: Min assist;+2 safety/equipment         General transfer comment: pt unsteady in standing and having trouble gaining balance R side, RW used for stability. Min A to steady and +2 for equipment  Ambulation/Gait Ambulation/Gait assistance: Min assist;+2 safety/equipment Gait Distance (Feet): 125 Feet Assistive device: Rolling walker (2 wheeled) Gait Pattern/deviations: Step-through pattern;Trunk flexed;Ataxic;Decreased weight shift to left Gait velocity: decreased Gait velocity interpretation: <1.31 ft/sec, indicative of household ambulator General Gait Details: pt has difficulty clearing R foot, decreased R step length and decreased R stance time. vc's for staying close to RW, very unsafe with turning. Some R knee instability noted but difficult to discern at this point if it is due to CVA or injury to R knee when he fell.   Stairs            Wheelchair Mobility    Modified Rankin (Stroke Patients Only) Modified Rankin (Stroke Patients Only) Pre-Morbid Rankin Score: Slight disability Modified Rankin: Moderately severe disability     Balance Overall balance assessment: Needs assistance;History of Falls Sitting-balance support: No upper extremity supported Sitting balance-Leahy Scale: Good     Standing balance support: Single extremity supported Standing balance-Leahy Scale: Poor Standing balance comment: unsteady in standing, min A to prevent LOB                             Pertinent Vitals/Pain  Pain Assessment: 0-10 Pain Score: 9  Pain Location: R knee, bruised from falling Pain Descriptors / Indicators: Aching;Sore;Tightness Pain Intervention(s): Limited activity within patient's tolerance;Monitored during session    [place] expects to be discharged to:: Private residence Living Arrangements: Alone Available Help at Discharge:  Family;Available PRN/intermittently Type of Home: Mobile home Home Access: Stairs to enter Entrance Stairs-Rails: Right;Left;Can reach both Entrance Stairs-Number of Steps: 4 Home Layout: One level Home Equipment: None Additional Comments: pt reports that his son can stay with him or he can stay with his son short term if needed    Prior Function Level of Independence: Independent         Comments: drives     Hand Dominance   Dominant Hand: Right    Extremity/Trunk Assessment   Upper Extremity Assessment Upper Extremity Assessment: Defer to OT evaluation    Lower Extremity Assessment Lower Extremity Assessment: RLE deficits/detail RLE Deficits / Details: df 2+/5, hip flex 4-/5, knee ext 4-/5 but pt has difficulty maintaining contractions and poor neuromuscular control noted with mvmt and occaisonal R knee buckling with ambulation.  RLE Sensation: decreased light touch;decreased proprioception;history of peripheral neuropathy RLE Coordination: decreased fine motor;decreased gross motor    Cervical / Trunk Assessment Cervical / Trunk Assessment: Kyphotic  Communication   Communication: No difficulties  Cognition Arousal/Alertness: Awake/alert Behavior During Therapy: WFL for tasks assessed/performed Overall Cognitive Status: History of cognitive impairments - at baseline                                 General Comments: poor safety awareness      General Comments General comments (skin integrity, edema, etc.): SpO2 92% on RA, HR in 90's    Exercises     Assessment/Plan    PT Assessment Patient needs continued PT services  PT Problem List Decreased strength;Decreased activity tolerance;Decreased balance;Decreased mobility;Decreased coordination;Decreased cognition;Decreased knowledge of use of DME;Decreased safety awareness;Decreased knowledge of precautions;Impaired sensation;Impaired tone       PT Treatment Interventions DME instruction;Gait  training;Stair training;Functional mobility training;Therapeutic activities;Therapeutic exercise;Balance training;Neuromuscular re-education;Cognitive remediation;Patient/family education    PT Goals (Current goals can be found in the Care Plan section)  Acute Rehab PT Goals Patient Stated Goal: return home PT Goal Formulation: With patient Time For Goal Achievement: 12/11/18 Potential to Achieve Goals: Good    Frequency Min 4X/week   Barriers to discharge Decreased caregiver support lives alone but son can be with him if needed    Co-evaluation               AM-PAC PT "6 Clicks" Mobility  Outcome Measure Help needed turning from your back to your side while in a flat bed without using bedrails?: A Little Help needed moving from lying on your back to sitting on the side of a flat bed without using bedrails?: A Little Help needed moving to and from a bed to a chair (including a wheelchair)?: A Little Help needed standing up from a chair using your arms (e.g., wheelchair or bedside chair)?: A Little Help needed to walk in hospital room?: A Lot Help needed climbing 3-5 steps with a railing? : Total 6 Click Score: 15    End of Session Equipment Utilized During Treatment: Gait belt Activity Tolerance: Patient tolerated treatment well Patient left: in chair;with call bell/phone within reach;with chair alarm set Nurse Communication: Mobility status PT Visit Diagnosis: Unsteadiness on feet (R26.81);Difficulty in walking, not elsewhere  classified (R26.2);Hemiplegia and hemiparesis Hemiplegia - Right/Left: Right Hemiplegia - dominant/non-dominant: Dominant Hemiplegia - caused by: Cerebral infarction    Time: 1141-1207 PT Time Calculation (min) (ACUTE ONLY): 26 min   Charges:   PT Evaluation $PT Eval Moderate Complexity: 1 Mod PT Treatments $Gait Training: 8-22 mins        Leighton Roach, [place]  Pager [phone] Office [place] 11/27/2018, 1:43 PM

## 2018-11-27 NOTE — Progress Notes (Signed)
Patient Demographics:    [name], is a 58 y.o. male, DOB - 01-23-1961, PYP:[id number]  Admit date - 11/26/2018   Admitting Physician Lenna Sciara, MD  Outpatient Primary MD for the patient is Colon Branch, MD  LOS - 1   Chief Complaint  Patient presents with   Stroke Symptoms        Subjective:    [name] today has no fevers, no emesis,  No chest pain,  Pt wants to leave AMA  Assessment  & Plan :    Principal Problem:   CVA (cerebral vascular accident) [facility])   Brief Summary  58 y.o. male with medical history significant of hypertension, hyperlipidemia, diabetes, BPH, tobacco use, OSA, anxiety, depression pancreatitis, PAD, CAD, diastolic heart failure, prior CVA (2008 and 2016 with some residual right-sided motor and sensory deficits after the first stroke, and residual right homonymous hemianopsia following the second stroke) admitted on 11/26/2018 with worsening right-sided weakness, unsteady gait, slurred speech found to have acute lacunar infarcts in the distal left thalamic/acute right cerebellar infarct  A/p  1)Acute CVA/Brain MRI with  acute Lacunar infarcts in the distal left thalamic/acute right cerebellar infarct--  echo with EF of 60 to 65%, inferobasal hypokinesis, continue aspirin 81 mg daily, Plavix 75 mg daily, Lipitor 80 mg daily and fenofibrate 160 mg daily, , CTA without LVO, A1c 6.3, HDL is 33, LDL is 73  2)Tobacco abuse --patient is a 2  pack-a-day smoker (he rolls his own cig), smoking cessation advised, give nicotine patch  3)CAD/PAD-no chest pains or ACS symptoms, continue aspirin, Plavix, fenofibrate and Lipitor, hold off on beta-blockers in order to allow for permissive hypertension in the setting of acute stroke  4)Obesity/OSA--- recommend CPAP nightly  5)Depression/Anxiety--- stable, lorazepam as needed as above, give trazodone 50 mg nightly  6)HTN-allow  permissive hypertension, hold isosorbide 60 mg daily, hold lisinopril 5 mg daily, hold metoprolol 25 mg twice daily, use IV labetalol as needed elevated BP  7)H/o EToh/Cocaine Abuse-okay to use lorazepam per CIWA protocol for DTs, continue thiamine and folic acid  8)COPD/emphysema--stable, no acute exacerbation, PRN albuterol as ordered  9)Social/Ethics--- PT recommends inpatient rehab, patient declines inpatient rehab at this time he wants to leave AMA, d/w Pt's daughter-in-law [name] at patient request-- at [phone]--family trying to decide if patient should go to inpatient rehab or go home AMA  Disposition/Need for in-Hospital Stay- patient unable to be discharged at this time due to physical therapist recommends inpatient rehab--*  Code Status : Full code  Family Communication:   daughter-in-law [name] at patient request-- at [phone]-  Disposition Plan  : CIR if patient agrees  Consults  :  Neuro  DVT Prophylaxis  :    Heparin -   Lab Results  Component Value Date   PLT 165 11/26/2018    Inpatient Medications  Scheduled Meds:   stroke: mapping our early stages of recovery book   Does not apply Once   atorvastatin  80 mg Oral q1800   clopidogrel  75 mg Oral Daily   fenofibrate  160 mg Oral Daily   folic acid  1 mg Oral Daily   nicotine  21 mg Transdermal q1800   pantoprazole  40 mg Oral  Daily   Continuous Infusions: PRN Meds:.acetaminophen **OR** acetaminophen (TYLENOL) oral liquid 160 mg/5 mL **OR** acetaminophen, nitroGLYCERIN, senna-docusate    Anti-infectives (From admission, onward)   None        Objective:   Vitals:   11/26/18 2325 11/27/18 0042 11/27/18 0339 11/27/18 0727  BP: (!) 154/74 (!) 157/88 117/65 130/89  Pulse: 79 94 65 65  Resp:  15 (!) 22 17  Temp: 98.4 F (36.9 C) 98.5 F (36.9 C) 98.4 F (36.9 C) 98 F (36.7 C)  TempSrc: Axillary Axillary Axillary Axillary  SpO2: 92% 96% 94% 92%  Weight:        Height:        Wt Readings from Last 3 Encounters:  11/26/18 103.4 kg  01/05/18 100.4 kg  12/06/17 101.3 kg    No intake or output data in the 24 hours ending 11/27/18 1213   Physical Exam Patient is examined daily including today on 11/27/18 , exams remain the same as of yesterday except that has changed   Gen:- Awake Alert,  In no apparent distress  HEENT:- [place].AT, No sclera icterus Neck-Supple Neck,No JVD,.  Lungs-  CTAB , fair symmetrical air movement CV- S1, S2 normal, regular  Abd-  +ve B.Sounds, Abd Soft, No tenderness,    Extremity/Skin:- No  edema, pedal pulses present = Psych-affect is appropriate, oriented x3 Neuro-old residual neuro deficits from previous strokes --some residual right-sided motor and sensory deficits after the first stroke, and residual right homonymous hemianopsia after second stroke, now with worsening right-sided weakness, worsening speech and gait problems   Data Review:   Micro Results No results found for this or any previous visit (from the past 240 hour(s)).  Radiology Reports Ct Angio Head W Or Wo Contrast  Result Date: 11/26/2018 CLINICAL DATA:  Right-sided numbness since yesterday. History of CVA. EXAM: CT ANGIOGRAPHY HEAD AND NECK TECHNIQUE: Multidetector CT imaging of the head and neck was performed using the standard protocol during bolus administration of intravenous contrast. Multiplanar CT image reconstructions and MIPs were obtained to evaluate the vascular anatomy. Carotid stenosis measurements (when applicable) are obtained utilizing NASCET criteria, using the distal internal carotid diameter as the denominator. CONTRAST:  31mL OMNIPAQUE IOHEXOL 350 MG/ML SOLN COMPARISON:  Head MRA 02/20/2015 and neck MRA 12/06/2006 FINDINGS: CTA NECK FINDINGS Aortic arch: Standard 3 vessel aortic arch with mild atherosclerotic plaque. No significant arch vessel origin stenosis. Right carotid system: Patent with moderate soft and calcified plaque at  the carotid bifurcation and in the distal cervical ICA. No evidence of significant stenosis or dissection. Left carotid system: Patent with moderate calcified and soft plaque at the carotid bifurcation and in the mid cervical ICA. No evidence of significant stenosis or dissection. Vertebral arteries: The vertebral arteries are patent with the left being moderately dominant. There is chronic moderate to severe stenosis of the right vertebral artery origin. No significant stenosis is seen on the left. Skeleton: No acute osseous abnormality or suspicious osseous lesion. Other neck: No mass or enlarged lymph nodes. Upper chest: Motion artifact and mild dependent atelectasis in both lung apices. Review of the MIP images confirms the above findings CTA HEAD FINDINGS Anterior circulation: The internal carotid arteries are patent from skull base to carotid termini with left greater than right siphon atherosclerosis resulting in up to mild left cavernous stenosis. ACAs and MCAs are patent without evidence of proximal branch occlusion or significant proximal stenosis. No aneurysm is identified. Posterior circulation: The intracranial vertebral arteries are patent to the  basilar with mild atherosclerotic irregularity bilaterally but no significant stenosis. Patent left PICA, bilateral AICAs, and bilateral SCAs are identified. The basilar artery is patent without significant stenosis. PCAs are patent without evidence of significant stenosis. No aneurysm is identified. Venous sinuses: As permitted by contrast timing, patent. Anatomic variants: None. Delayed phase: No abnormal enhancement. Review of the MIP images confirms the above findings IMPRESSION: 1. No large vessel occlusion. 2. Cervical carotid artery atherosclerosis without significant stenosis. 3. Chronic moderate to severe right vertebral artery origin stenosis. 4. Mild intracranial atherosclerosis without flow limiting proximal stenosis. 5.  Aortic Atherosclerosis  (ICD10-I70.0). Electronically Signed   By: Logan Bores [name]   On: 11/26/2018 15:26   Ct Head Wo Contrast  Result Date: 11/26/2018 CLINICAL DATA:  58 year old male with acute RIGHT sided numbness. EXAM: CT HEAD WITHOUT CONTRAST TECHNIQUE: Contiguous axial images were obtained from the base of the skull through the vertex without intravenous contrast. COMPARISON:  02/20/2015 MR and 11/21/2006 CT FINDINGS: Brain: Bilateral basal ganglia lacunar infarcts are age indeterminate. No other evidence of acute infarction, hemorrhage, hydrocephalus, extra-axial collection or mass lesion/mass effect. Atrophy, chronic small-vessel white matter ischemic changes and remote RIGHT cerebellar and LEFT thalamic infarcts noted. Vascular: Atherosclerotic calcifications noted. Skull: Normal. Negative for fracture or focal lesion. Sinuses/Orbits: No acute finding. Other: None. IMPRESSION: 1. Age indeterminate bilateral basal ganglia lacunar infarcts, but most likely subacute to remote. No hemorrhage. 2. Atrophy, chronic small-vessel white matter ischemic changes and remote RIGHT cerebellar and LEFT thalamic infarcts. Electronically Signed   By: Margarette Canada [name]   On: 11/26/2018 13:50   Ct Angio Neck W And/or Wo Contrast  Result Date: 11/26/2018 CLINICAL DATA:  Right-sided numbness since yesterday. History of CVA. EXAM: CT ANGIOGRAPHY HEAD AND NECK TECHNIQUE: Multidetector CT imaging of the head and neck was performed using the standard protocol during bolus administration of intravenous contrast. Multiplanar CT image reconstructions and MIPs were obtained to evaluate the vascular anatomy. Carotid stenosis measurements (when applicable) are obtained utilizing NASCET criteria, using the distal internal carotid diameter as the denominator. CONTRAST:  61mL OMNIPAQUE IOHEXOL 350 MG/ML SOLN COMPARISON:  Head MRA 02/20/2015 and neck MRA 12/06/2006 FINDINGS: CTA NECK FINDINGS Aortic arch: Standard 3 vessel aortic arch with mild  atherosclerotic plaque. No significant arch vessel origin stenosis. Right carotid system: Patent with moderate soft and calcified plaque at the carotid bifurcation and in the distal cervical ICA. No evidence of significant stenosis or dissection. Left carotid system: Patent with moderate calcified and soft plaque at the carotid bifurcation and in the mid cervical ICA. No evidence of significant stenosis or dissection. Vertebral arteries: The vertebral arteries are patent with the left being moderately dominant. There is chronic moderate to severe stenosis of the right vertebral artery origin. No significant stenosis is seen on the left. Skeleton: No acute osseous abnormality or suspicious osseous lesion. Other neck: No mass or enlarged lymph nodes. Upper chest: Motion artifact and mild dependent atelectasis in both lung apices. Review of the MIP images confirms the above findings CTA HEAD FINDINGS Anterior circulation: The internal carotid arteries are patent from skull base to carotid termini with left greater than right siphon atherosclerosis resulting in up to mild left cavernous stenosis. ACAs and MCAs are patent without evidence of proximal branch occlusion or significant proximal stenosis. No aneurysm is identified. Posterior circulation: The intracranial vertebral arteries are patent to the basilar with mild atherosclerotic irregularity bilaterally but no significant stenosis. Patent left PICA, bilateral AICAs, and bilateral SCAs are  identified. The basilar artery is patent without significant stenosis. PCAs are patent without evidence of significant stenosis. No aneurysm is identified. Venous sinuses: As permitted by contrast timing, patent. Anatomic variants: None. Delayed phase: No abnormal enhancement. Review of the MIP images confirms the above findings IMPRESSION: 1. No large vessel occlusion. 2. Cervical carotid artery atherosclerosis without significant stenosis. 3. Chronic moderate to severe right  vertebral artery origin stenosis. 4. Mild intracranial atherosclerosis without flow limiting proximal stenosis. 5.  Aortic Atherosclerosis (ICD10-I70.0). Electronically Signed   By: Logan Bores [name]   On: 11/26/2018 15:26   Mr Brain Wo Contrast  Result Date: 11/26/2018 CLINICAL DATA:  58 year old male with right side numbness since yesterday. Previous cerebral small vessel ischemia. EXAM: MRI HEAD WITHOUT CONTRAST TECHNIQUE: Multiplanar, multiecho pulse sequences of the brain and surrounding structures were obtained without intravenous contrast. COMPARISON:  CTA head and neck earlier today.  Brain MRI 02/20/2015. FINDINGS: Brain: 12 millimeter curvilinear area of restricted diffusion in the dorsal left thalamus (series 6, image 27 and series 7, image 53) with T2 and FLAIR hyperintensity. No acute hemorrhage, mass effect. Additionally, punctate focus of restricted diffusion in the right mid cerebellar folia on series 5, image 65. No associated hemorrhage or mass effect. Stable to mildly progressed regional right cerebellar small chronic infarcts since 2016. Chronic but substantially increased since 2016 small vessel ischemia in the pons and bilateral deep gray matter nuclei. More mildly progressed chronic periventricular white matter T2 and FLAIR hyperintensity which is also probably small vessel ischemia related. There are occasional chronic micro hemorrhages in the cerebral hemispheres. No definite cortical encephalomalacia. No midline shift, mass effect, evidence of mass lesion, ventriculomegaly, extra-axial collection or acute intracranial hemorrhage. Cervicomedullary junction and pituitary are within normal limits. Vascular: Major intracranial vascular flow voids are stable since 2016. Skull and upper cervical spine: Negative visible cervical spine. Visualized bone marrow signal is within normal limits. Sinuses/Orbits: Negative. Other: Mastoids remain clear.  Stable scalp soft tissues. IMPRESSION: 1. Acute  lacunar infarct in the dorsal left thalamus. No associated hemorrhage or mass effect. Underlying chronic but substantially increased since 2016 bilateral deep gray matter nuclei and brainstem small vessel disease. 2. Superimposed punctate acute right cerebellar infarct with chronic regional right cerebellar ischemia stable to mildly progressed since 2016. Electronically Signed   By: Genevie Ann [name]   On: 11/26/2018 18:17   Dg Chest Port 1 View  Result Date: 11/26/2018 CLINICAL DATA:  Shortness of breath EXAM: PORTABLE CHEST 1 VIEW COMPARISON:  01/05/2018 FINDINGS: There is no focal parenchymal opacity. There is no pleural effusion or pneumothorax. There is stable cardiomegaly. There is evidence of prior CABG. The osseous structures are unremarkable. IMPRESSION: No active disease. Electronically Signed   By: Kathreen Devoid   On: 11/26/2018 14:21     CBC Recent Labs  Lab 11/26/18 1222  WBC 7.7  HGB 17.4*  HCT 53.7*  PLT 165  MCV 98.5  MCH 31.9  MCHC 32.4  RDW 14.9  LYMPHSABS 1.2  MONOABS 1.0  EOSABS 0.1  BASOSABS 0.0    Chemistries  Recent Labs  Lab 11/26/18 1222  NA 135  K 4.6  CL 95*  CO2 24  GLUCOSE 104*  BUN 9  CREATININE 0.91  CALCIUM 9.4  AST 22  ALT 19  ALKPHOS 88  BILITOT 2.1*   ------------------------------------------------------------------------------------------------------------------ Recent Labs    11/27/18 0441  CHOL 141  HDL 33*  LDLCALC 73  TRIG 175*  CHOLHDL 4.3    Lab  Results  Component Value Date   HGBA1C 6.3 (H) 11/27/2018   ------------------------------------------------------------------------------------------------------------------ No results for input(s): TSH, T4TOTAL, T3FREE, THYROIDAB in the last 72 hours.  Invalid input(s): FREET3 ------------------------------------------------------------------------------------------------------------------ No results for input(s): VITAMINB12, FOLATE, FERRITIN, TIBC, IRON, RETICCTPCT in the  last 72 hours.  Coagulation profile Recent Labs  Lab 11/26/18 1222  INR 1.1    No results for input(s): DDIMER in the last 72 hours.  Cardiac Enzymes Recent Labs  Lab 11/26/18 1222  TROPONINI <0.03   ------------------------------------------------------------------------------------------------------------------    Component Value Date/Time   BNP 167.9 (H) 11/26/2018 1222     Roxan Hockey M.D on 11/27/2018 at 12:13 PM  Go to [email or web address] - for contact info  [facility] - Office  [phone]

## 2018-11-27 NOTE — Progress Notes (Signed)
  Echocardiogram 2D Echocardiogram bubble study with definity has been performed.  [name] 11/27/2018, 9:00 AM

## 2018-11-27 NOTE — Progress Notes (Signed)
STROKE TEAM PROGRESS NOTE   HISTORY OF PRESENT ILLNESS (per record) [name]. is an 58 y.o. male  With PMH MI, morbid obesity, CVA (2016, 2008 w/ residual right side motor and sensory deficits following the 1st stroke, residual right homonymous hemianopsia following the 2nd stroke), HTN, HLD, CHF, CAD, ETOH and cocaine abuse who presented to [facility] ED with c/o new onset slurred speech and left sided numbness.  Patient states that he had some right sided numbness and weakness that started yesterday. He did initially call EMS, but then refused transport to the hospital yesterday. He thought that it would pass, but it did not. He laid down for a nap at 1200 and when he woke up at 1300 he noticed that the right side felt weaker and more numb. He also noticed some slurred speech and reports trouble talking. He has had multiple previous CVA's with residual right side numbness, weakness and decreased vision in the right eye. Patient takes ASA and Plavix daily, denies missing any doses. Patient smokes 2 packs of cigarettes (that he rolls himself) per day and drinks 4 beers per day. Patient has emphysema and is SOB at baseline, which he states has been getting worse, but patient is not currently on a home O2 regimen.   ED course:  BP: 181/103 BG:104,   CTH: bilateral BG lacunar infarcts, no hemorrhage, remote right cerebellar and left thalamic infarcts CTA: no LVO, right vertebral artery stenosis, mild ICAD, cervical carotid artery with atherosclerosis w/o stenosis.  MRI: pending   02/2015: left lateral thalamus inferiorly to the region of the left optic radiations, small lacunar infarct posterior left corona radiata 2008: right cerebellar infarcts, CVA with residual right side weakness.  Date last known well: 11/25/2018 Time last known well: unknown tPA Given: No: outside of the window   SUBJECTIVE (INTERVAL HISTORY)  Patient states that his numbness and speech are improving.  He has prior  history of strokes with residual deficits.  He admits to not being compliant with his medications.    OBJECTIVE Vitals:   11/26/18 2325 11/27/18 0042 11/27/18 0339 11/27/18 0727  BP: (!) 154/74 (!) 157/88 117/65 130/89  Pulse: 79 94 65 65  Resp:  15 (!) 22 17  Temp: 98.4 F (36.9 C) 98.5 F (36.9 C) 98.4 F (36.9 C) 98 F (36.7 C)  TempSrc: Axillary Axillary Axillary Axillary  SpO2: 92% 96% 94% 92%  Weight:      Height:        CBC:  Recent Labs  Lab 11/26/18 1222  WBC 7.7  NEUTROABS 5.4  HGB 17.4*  HCT 53.7*  MCV 98.5  PLT 956    Basic Metabolic Panel:  Recent Labs  Lab 11/26/18 1222  NA 135  K 4.6  CL 95*  CO2 24  GLUCOSE 104*  BUN 9  CREATININE 0.91  CALCIUM 9.4    Lipid Panel:     Component Value Date/Time   CHOL 141 11/27/2018 0441   CHOL 160 08/24/2017 1438   TRIG 175 (H) 11/27/2018 0441   HDL 33 (L) 11/27/2018 0441   HDL 39 (L) 08/24/2017 1438   CHOLHDL 4.3 11/27/2018 0441   VLDL 35 11/27/2018 0441   LDLCALC 73 11/27/2018 0441   LDLCALC 83 08/24/2017 1438   HgbA1c:  Lab Results  Component Value Date   HGBA1C 6.3 (H) 11/27/2018   Urine Drug Screen:     Component Value Date/Time   LABOPIA NONE DETECTED 11/26/2018 1855   COCAINSCRNUR NONE  DETECTED 11/26/2018 1855   LABBENZ NONE DETECTED 11/26/2018 1855   AMPHETMU NONE DETECTED 11/26/2018 1855   THCU POSITIVE (A) 11/26/2018 1855   LABBARB NONE DETECTED 11/26/2018 1855    Alcohol Level No results found for: ETH  IMAGING  Ct Angio Head W Or Wo Contrast Ct Angio Neck W And/or Wo Contrast 11/26/2018 IMPRESSION:  1. No large vessel occlusion.  2. Cervical carotid artery atherosclerosis without significant stenosis.  3. Chronic moderate to severe right vertebral artery origin stenosis.  4. Mild intracranial atherosclerosis without flow limiting proximal stenosis.  5.  Aortic Atherosclerosis (ICD10-I70.0).  Ct Head Wo Contrast 11/26/2018 IMPRESSION:  1. Age indeterminate bilateral  basal ganglia lacunar infarcts, but most likely subacute to remote. No hemorrhage.  2. Atrophy, chronic small-vessel white matter ischemic changes and remote RIGHT cerebellar and LEFT thalamic infarcts.    Mr Brain Wo Contrast 11/26/2018 IMPRESSION:  1. Acute lacunar infarct in the dorsal left thalamus. No associated hemorrhage or mass effect. Underlying chronic but substantially increased since 2016 bilateral deep gray matter nuclei and brainstem small vessel disease.  2. Superimposed punctate acute right cerebellar infarct with chronic regional right cerebellar ischemia stable to mildly progressed since 2016.   Dg Chest Port 1 View 11/26/2018 IMPRESSION:  No active disease.     Transthoracic Echocardiogram With Bubble Study 00/00/2020 IMPRESSIONS  1. The left ventricle has normal systolic function with an ejection fraction of 60-65%. The cavity size was normal. There is mildly increased left ventricular wall thickness. Left ventricular diastolic parameters were normal.  2. Inferobasal hypokinesis.  3. The right ventricle has normal systolic function. The cavity was normal. There is no increase in right ventricular wall thickness.  4. Left atrial size was moderately dilated.  5. Mild thickening of the mitral valve leaflet. Mild calcification of the mitral valve leaflet.  6. The tricuspid valve is grossly normal.  7. The aortic valve was not well visualized. Moderate thickening of the aortic valve. Sclerosis without any evidence of stenosis of the aortic valve. No stenosis of the aortic valve.   EKG - SR rate 78 BPM. (See cardiology reading for complete details)    PHYSICAL EXAM Blood pressure 130/89, pulse 65, temperature 98 F (36.7 C), temperature source Axillary, resp. rate 17, height 5\' 11"  (1.803 m), weight 103.4 kg, SpO2 92 %. Middle-aged obese male not in distress. . Afebrile. Head is nontraumatic. Neck is supple without bruit.    Cardiac exam no murmur or gallop. Lungs are  clear to auscultation. Distal pulses are well felt. Neurological Exam :  Awake alert oriented to time place and person.  Mild dysarthria.  No aphasia.  Extraocular movements are full range with mild saccadic dysmetria to the right.  Right partial homonymous hemianopsia.  Right lower facial asymmetry.  Tongue midline.  Motor system exam mild right upper and lower extremity drift and significant weakness of right grip and intrinsic hand muscles.  Orbits left over right upper extremity.  Mild weakness of right hip flexors and ankle dorsiflexors only.  Coordination is slow but accurate.  Diminished right hemibody sensation.  Deep tendon reflexes are brisker on the right than the left.  Both plantars are downgoing.  Gait not tested.   ASSESSMENT/PLAN [name]. is a 58 y.o. male with history of MI, morbid obesity, CVA (2016, 2008 w/ residual right side motor and sensory deficits following the 1st stroke, residual right homonymous hemianopsia following the 2nd stroke), HTN, ASPVD, HLD, CHF, CAD, ETOH, emphysema on  home O2, tobacco use, OSA  and cocaine abuse presenting with new onset slurred speech and left sided numbness. He did not receive IV t-PA due to late presentation.  Stroke: Acute lacunar infarct in the dorsal left thalamus.  Resultant subjective paresthesias and old right hemiparesis and peripheral vision loss  CT head - Age indeterminate bilateral basal ganglia lacunar infarcts, but most likely subacute to remote. Remote RIGHT cerebellar and LEFT thalamic infarcts.   MRI head - Acute lacunar infarct in the dorsal left thalamus.  MRA head - not performed  CTA H&N - Chronic moderate to severe right vertebral artery origin stenosis.   Carotid Doppler - CTA neck performed - carotid dopplers not indicated.  2D Echo  - EF 60 - 65%. No cardiac source of emboli identified.   LDL - 73  HgbA1c - 6.3  UDS - positive for THCU  VTE prophylaxis - SCDs  Diet  - Heart healthy with  thin liquids.  aspirin 81 mg daily and clopidogrel 75 mg daily prior to admission, now on clopidogrel 75 mg daily  Patient counseled to be compliant with his antithrombotic medications  Ongoing aggressive stroke risk factor management  Therapy recommendations:  pending  Disposition:  Pending  Hypertension  Stable . Permissive hypertension (OK if < 220/120) but gradually normalize in 5-7 days . Long-term BP goal normotensive  Hyperlipidemia  Lipid lowering medication PTA:  Lipitor 80 mg daily and Fenofibrate 145 mg daily  LDL , goal < 70  Current lipid lowering medication:  Lipitor 80 mg daily  Continue statin at discharge  Other Stroke Risk Factors  Cigarette smoker - advised to stop smoking  ETOH use, advised to drink no more than 1 alcoholic beverage per day.  Obesity, Body mass index is 31.8 kg/m., recommend weight loss, diet and exercise as appropriate   Hx stroke/TIA  Coronary artery disease  Obstructive sleep apnea  PLAN  Resume ASA and continue ASA / Plavix regimen   Smoking cessation   Hospital day # 1  I have personally obtained history,examined this patient, reviewed notes, independently viewed imaging studies, participated in medical decision making and plan of care.ROS completed by me personally and pertinent positives fully documented  I have made any additions or clarifications directly to the above note.  He presented with paresthesias secondary to new lacunar infarcts involving thalamus and cerebellum.  Patient was counseled to be compliant with his antiplatelet therapy and recommend adding aspirin to his Plavix.  Maintain aggressive risk factor modification.  Patient was counseled to quit smoking as well as marijuana.  Continue ongoing stroke work-up.  Greater than 50% time during this 35-minute visit was spent on counseling and coordination of care about his lacunar stroke and discussion about risk factor modification and need for medication  compliance and answering questions.  Antony Contras, MD Medical Director [facility] Pager: [phone] 11/27/2018 3:24 PM   To contact Stroke Continuity provider, please refer to [email or web address] After hours, contact General Neurology

## 2018-11-27 NOTE — Progress Notes (Signed)
Rehab Admissions Coordinator Note:  Patient was screened by Cleatrice Burke for appropriateness for an Inpatient Acute Rehab Consult per PT recs. Noted pt wishing to d/c home AMA. He will have to agree for inpt rehab if felt to be a candidate by inpt rehab team. I will place rehab consult order and pt will be assessed tomorrow.Cleatrice Burke RN MSN 11/27/2018, 7:11 PM  I can be reached at [phone].

## 2018-11-28 ENCOUNTER — Telehealth: Payer: Self-pay | Admitting: Internal Medicine

## 2018-11-28 ENCOUNTER — Telehealth: Payer: Self-pay

## 2018-11-28 DIAGNOSIS — Z8673 Personal history of transient ischemic attack (TIA), and cerebral infarction without residual deficits: Secondary | ICD-10-CM

## 2018-11-28 MED ORDER — ASPIRIN 81 MG PO TBEC: 81.0000 mg | DELAYED_RELEASE_TABLET | Freq: Every day | ORAL | 2 refills | Status: DC

## 2018-11-28 MED ORDER — THIAMINE HCL 100 MG PO TABS: 100.0000 mg | ORAL_TABLET | Freq: Every day | ORAL | 3 refills | Status: DC

## 2018-11-28 MED ORDER — ISOSORBIDE MONONITRATE ER 60 MG PO TB24: 60.0000 mg | ORAL_TABLET | Freq: Every day | ORAL | 3 refills | Status: DC

## 2018-11-28 MED ORDER — TRAZODONE HCL 50 MG PO TABS: 50.0000 mg | ORAL_TABLET | Freq: Every day | ORAL | 3 refills | Status: DC

## 2018-11-28 MED ORDER — NICOTINE 21 MG/24HR TD PT24: 21.0000 mg | MEDICATED_PATCH | Freq: Every day | TRANSDERMAL | 0 refills | Status: DC

## 2018-11-28 MED ORDER — NITROGLYCERIN 0.4 MG SL SUBL: 0.4000 mg | SUBLINGUAL_TABLET | SUBLINGUAL | 2 refills | Status: DC | PRN

## 2018-11-28 MED ORDER — ATORVASTATIN CALCIUM 80 MG PO TABS: 80.0000 mg | ORAL_TABLET | Freq: Every day | ORAL | 3 refills | Status: DC

## 2018-11-28 MED ORDER — LISINOPRIL 5 MG PO TABS: 5.0000 mg | ORAL_TABLET | Freq: Every day | ORAL | 3 refills | Status: DC

## 2018-11-28 MED ORDER — FOLIC ACID 1 MG PO TABS: 1.0000 mg | ORAL_TABLET | Freq: Every day | ORAL | 3 refills | Status: DC

## 2018-11-28 MED ORDER — METOPROLOL TARTRATE 25 MG PO TABS: 25.0000 mg | ORAL_TABLET | Freq: Two times a day (BID) | ORAL | 2 refills | Status: DC

## 2018-11-28 MED ORDER — PANTOPRAZOLE SODIUM 40 MG PO TBEC
40.0000 mg | DELAYED_RELEASE_TABLET | Freq: Every day | ORAL | Status: DC
  Administered 2018-11-28: 40 mg via ORAL
  Filled 2018-11-28: qty 1

## 2018-11-28 MED ORDER — PANTOPRAZOLE SODIUM 40 MG PO TBEC: 40.0000 mg | DELAYED_RELEASE_TABLET | Freq: Every day | ORAL | 2 refills | Status: DC

## 2018-11-28 MED ORDER — FENOFIBRATE 145 MG PO TABS: 145.0000 mg | ORAL_TABLET | Freq: Every day | ORAL | 3 refills | Status: DC

## 2018-11-28 MED ORDER — ADULT MULTIVITAMIN W/MINERALS CH: 1.0000 | ORAL_TABLET | Freq: Every day | ORAL | 2 refills | Status: DC

## 2018-11-28 NOTE — Telephone Encounter (Signed)
Copied from [place] [id number]. Topic: General - Other >> Nov 25, 2018  4:39 PM Rainey Pines A wrote: Reason for CRM: Patient called and stated that he was having a stroke. Pt stated that his whole right side was numb and that he has had a stroke before and that he is pretty sure he is having one now. Patient was alone . Contacted our nurse triage team lead and she advised [place] to call 911 or have patient contact 911 because he needed to go to ED immediately. Patient initially refused to go to ED or call 911 or have [place] to call 911. He stated that he needed to make some phone calls before he did that to his son and daughter. I offered to call 911 for him or offer to call his son or daughter for him. Patient declined all options given. Pt stated that he didn't want to go to emergency room due to [place]. Advised pt that he would be separated from patients with COVID. At the end of the conversation he stated that he was going to call 911 in 15 minutes. I advised patient t hat  I will call him in 15 minutes to make sure he called 911 or was headed to the ED. >> Nov 25, 2018  4:58 PM Rainey Pines A wrote: I called patient back in the 15 mins and the patient stated that he was going to call 911 right now.

## 2018-11-28 NOTE — Discharge Instructions (Signed)
1) quit smoking 2) please take medications as prescribed 3) you refused to go to rehab/physical therapy facility as advised----a physical therapist will be  coming to your house to help you with your rehab 4) compliance with dietary and lifestyle recommendations strongly encouraged-- 5) low-salt, low-cholesterol low-fat diet strongly advised 6) please use your CPAP every bedtime 7) significant moderation of alcohol intake advised 8) you are taking aspirin and Plavix which are blood thinners so please Avoid ibuprofen/Advil/Aleve/Motrin/Goody Powders/Naproxen/BC powders/Meloxicam/Diclofenac/Indomethacin and other Nonsteroidal anti-inflammatory medications as these will make you more likely to bleed and can cause stomach ulcers, can also cause Kidney problems.

## 2018-11-28 NOTE — Progress Notes (Signed)
Physical Therapy Treatment Patient Details Name: [name]. MRN: [id number] DOB: [birth year] Today's Date: 11/28/2018    History of Present Illness [name]. is an 58 y.o. male  With PMH MI, morbid obesity, CVA (2016, 2008 w/ residual right side motor and sensory deficits following the 1st stroke, residual right homonymous hemianopsia following the 2nd stroke), HTN, HLD, CHF, CAD, ETOH and cocaine abuse who presented to [facility] ED with c/o new onset slurred speech and right sided numbness. CT showed new lacunar basal ganglia infarcts. Of note also, pt with emphysema with SOB at baseline, not on supplemental O2 at home.     PT Comments    Patient seen for mobility progression. Pt is making progress toward PT goals. Pt does present with  improvements to R LE clearance with gait training however with increased distance continues to demonstrate R LE instability. Pt requires min guard assist with RW and min A with SPC. Pt with DGI score of 15/24 indicating risk for falls. Continue to progress as tolerated.    Follow Up Recommendations  CIR;Supervision/Assistance - 24 hour     Equipment Recommendations  Rolling walker with 5" wheels    Recommendations for Other Services       Precautions / Restrictions Precautions Precautions: Fall Precaution Comments: fell prior to coming in landing on both knees but R worse than L Restrictions Weight Bearing Restrictions: No    Mobility  Bed Mobility               General bed mobility comments: pt OOB in chair upon arrival  Transfers Overall transfer level: Needs assistance Equipment used: None Transfers: Sit to/from Stand Sit to Stand: Min assist;Min guard         General transfer comment: cues for safety  Ambulation/Gait Ambulation/Gait assistance: Min guard;Min assist Gait Distance (Feet): 200 Feet Assistive device: Rolling walker (2 wheeled);None;Straight cane Gait Pattern/deviations: Step-through  pattern;Antalgic Gait velocity: decreased   General Gait Details: pt with improved R foot clearance but does continue to demonstrate R LE instability with increased distance; min guard for safety with use of RW and min A without AD; pt with inability to demonstrate safe sequencing/use of SPC and reports feeling unsteady with SPC and prefers RW; cues for safe use RW when turning    Stairs             Wheelchair Mobility    Modified Rankin (Stroke Patients Only) Modified Rankin (Stroke Patients Only) Modified Rankin: Moderately severe disability     Balance Overall balance assessment: Needs assistance;History of Falls Sitting-balance support: No upper extremity supported Sitting balance-Leahy Scale: Good     Standing balance support: No upper extremity supported;During functional activity Standing balance-Leahy Scale: Poor Standing balance comment: min guard for safety at sink standing for ADLs                  Standardized Balance Assessment Standardized Balance Assessment : Dynamic Gait Index   Dynamic Gait Index Level Surface: Mild Impairment Change in Gait Speed: Mild Impairment Gait with Horizontal Head Turns: Mild Impairment Gait with Vertical Head Turns: Mild Impairment Gait and Pivot Turn: Normal Step Over Obstacle: Normal Step Around Obstacles: Mild Impairment Steps: Mild Impairment Total Score: 18      Cognition Arousal/Alertness: Awake/alert Behavior During Therapy: WFL for tasks assessed/performed Overall Cognitive Status: No family/caregiver present to determine baseline cognitive functioning Area of Impairment: Attention;Following commands;Memory;Safety/judgement;Awareness;Problem solving  Current Attention Level: Sustained Memory: Decreased recall of precautions;Decreased short-term memory Following Commands: Follows one step commands consistently;Follows one step commands with increased time;Follows multi-step  commands inconsistently Safety/Judgement: Decreased awareness of deficits;Decreased awareness of safety Awareness: Emergent Problem Solving: Difficulty sequencing;Requires verbal cues General Comments: pt with poor safety awareness, poor awareness of deficits; pt reports he is at baseline       Exercises      General Comments General comments (skin integrity, edema, etc.): non sustained Vtach noted on telemetry; elevated RR and SOB which pt reports is baseline with mobility      Pertinent Vitals/Pain Pain Assessment: Faces Faces Pain Scale: Hurts little more Pain Location: R knee with mobility  Pain Descriptors / Indicators: Aching;Guarding Pain Intervention(s): Limited activity within patient's tolerance;Monitored during session;Repositioned    Home Living Family/patient expects to be discharged to:: Private residence Living Arrangements: Alone Available Help at Discharge: Family;Available PRN/intermittently Type of Home: Mobile home Home Access: Stairs to enter Entrance Stairs-Rails: Right;Left;Can reach both Home Layout: One level Home Equipment: None Additional Comments: pt reports that his son can stay with him or he can stay with his son short term if needed    Prior Function Level of Independence: Independent      Comments: drives   PT Goals (current goals can now be found in the care plan section) Acute Rehab PT Goals Patient Stated Goal: return home Progress towards PT goals: Progressing toward goals    Frequency    Min 4X/week      PT Plan Current plan remains appropriate    Co-evaluation              AM-PAC PT "6 Clicks" Mobility   Outcome Measure  Help needed turning from your back to your side while in a flat bed without using bedrails?: A Little Help needed moving from lying on your back to sitting on the side of a flat bed without using bedrails?: A Little Help needed moving to and from a bed to a chair (including a wheelchair)?: A  Little Help needed standing up from a chair using your arms (e.g., wheelchair or bedside chair)?: A Little Help needed to walk in hospital room?: A Little Help needed climbing 3-5 steps with a railing? : A Little 6 Click Score: 18    End of Session Equipment Utilized During Treatment: Gait belt Activity Tolerance: Patient tolerated treatment well Patient left: in chair;with call bell/phone within reach;with chair alarm set Nurse Communication: Mobility status PT Visit Diagnosis: Unsteadiness on feet (R26.81);Difficulty in walking, not elsewhere classified (R26.2);Hemiplegia and hemiparesis Hemiplegia - Right/Left: Right Hemiplegia - dominant/non-dominant: Dominant Hemiplegia - caused by: Cerebral infarction     Time: 2440-1027 PT Time Calculation (min) (ACUTE ONLY): 41 min  Charges:  $Gait Training: 23-37 mins                     Earney Navy, PTA Acute Rehabilitation Services Pager: [phone] Office: [phone]     Darliss Cheney 11/28/2018, 10:56 AM

## 2018-11-28 NOTE — Progress Notes (Signed)
Patient is upset about having the be alarm on. Nurse has educated patient about the importance of have the alarm on. He is refusing to have the the bed alarm on.  He said "I have the right to have this alarm taken off. Nurse D/c alarm on the bed due to patient refusing.

## 2018-11-28 NOTE — Telephone Encounter (Signed)
Was d/c from the hospital, please arrange a hospital follow-up (virtual)

## 2018-11-28 NOTE — Evaluation (Signed)
Speech Language Pathology Evaluation Patient Details Name: [name]. MRN: [id number] DOB: [birth year] Today's Date: 11/28/2018 Time: 8469-6295 SLP Time Calculation (min) (ACUTE ONLY): 25 min  Problem List:  Patient Active Problem List   Diagnosis Date Noted  . Hiatal hernia   . Hepatic steatosis   . Hemianopia, homonymous, right   . ETOH abuse   . Esophagitis   . Chronic diastolic CHF (congestive heart failure) ([place])   . Chronic bronchitis ([place])   . CAD (coronary artery disease)   . PAD (peripheral artery disease) ([place]) 09/23/2016  . Morbid obesity ([place])   . Chronic pain syndrome 10/23/2015  . GERD (gastroesophageal reflux disease) 08/07/2015  . Pancreatitis, acute   . PCP NOTES >>>>> 06/01/2015  . Annual physical exam 05/31/2015  . Acute ischemic stroke ([place]) 02/20/2015  . Hx of cocaine abuse ([place]) 02/20/2015  . BPH (benign prostatic hyperplasia) 02/20/2015  . OSA (obstructive sleep apnea) ?? 05/05/2013  . Poor compliance 05/05/2013  . Anxiety and depression 05/05/2013  . Erectile dysfunction 03/17/2012  . Emphysema (subcutaneous) (surgical) resulting from a procedure 10/08/2008  . Iron deficiency anemia 08/10/2008  . ABNORMALITY OF GAIT 08/06/2008  . TOBACCO ABUSE 07/27/2008  . Dyslipidemia 07/26/2008  . HYPERTENSION, BENIGN ESSENTIAL 07/26/2008  . ST elevation myocardial infarction involving left circumflex coronary artery ([place]) 07/26/2008   Past Medical History:  Past Medical History:  Diagnosis Date  . Abnormality of gait   . Anxiety   . CAD (coronary artery disease)    a. 10/2008 Inferolateral STEMI: 3VD w/ occluded LCX (PTCA)-->CABG x 3 (LIMA->LAD, VG->OM, VG->PDA);  b. 06/2009 NSTEMI/PCI: VG->OM 100 (BMS);  c. 05/2013 native 3VD, 3/3 patent grafts. d. 06/2016: Inferior STEMI w/ occluded SVG-OM (DES placed). LIMA-LAD and SVG-PDA patent.  . Chronic bronchitis ([place])   . Chronic diastolic CHF (congestive heart failure) ([place])    a. 02/2015 Echo: EF 50-55%,  distal septal, apical, inferobasal HK, mild LVH, mild MR, mildly dil LA.  [name] Cocaine use   . Colon polyps    a. 09/2015 colonscopy: multiple sessile polyps, path negative for high grade dysplasia.  . Depression   . Emphysema (subcutaneous) (surgical) resulting from a procedure 10/2008.   Bleb resected at time of 10/2008 CABG. Marketed emphysema noted on surgical report.  . Esophagitis    a. 2017 EGD: esophagitis, duodenitis.  [name] ETOH abuse   . GERD (gastroesophageal reflux disease)   . Hemianopia, homonymous, right    "can't see out the right side of either eye since stroke in 2016/pt description 09/23/2016  . Hepatic steatosis   . Hiatal hernia   . History of nuclear stress test 08/2017   Nuclear stress test 1/19: EF 35, inf-lat and ant-lat, apical sept/apical scar; no ischemia; Intermediate Risk  . Hyperlipidemia   . Hypertension   . Iron deficiency anemia 2010  . Morbid obesity ([place])   . Myocardial infarction ([place]) "several"  . PAD (peripheral artery disease) ([place])   . Pancreatitis 06/2015  . Sleep apnea    "don't have a mask" (09/23/2016)  . Stroke ([place]) 02/2015   a. 02/2015 Carotid U/S: 1-39% bilat ICA stenosis; "left me blind" (09/23/2016)  . Tobacco abuse    still smokes  . Tubular adenoma of colon    Past Surgical History:  Past Surgical History:  Procedure Laterality Date  . ABDOMINAL AORTOGRAM W/LOWER EXTREMITY N/A 09/23/2016   Procedure: Abdominal Aortogram w/Lower Extremity;  Surgeon: Wellington Hampshire, MD;  Location: [facility];  Service:  Cardiovascular;  Laterality: N/A;  . CARDIAC CATHETERIZATION  11/07/2008   EF of 50-55% -- normal LV systolic function, acute inferolateral ST elevation MI,  PTCA of the circumflex artery -- [name]. [name], [name]  . CARDIAC CATHETERIZATION N/A 07/07/2016   Procedure: Left Heart Cath and Cors/Grafts Angiography;  Surgeon: [name], MD;  Location: [facility];  Service: Cardiovascular;  Laterality: N/A;  . CARDIAC  CATHETERIZATION N/A 07/07/2016   Procedure: Coronary Stent Intervention;  Surgeon: [name], MD;  Location: [facility];  Service: Cardiovascular;  Laterality: N/A;  . CORONARY ANGIOPLASTY    . CORONARY ARTERY BYPASS GRAFT     "CABG X4"  . LEFT HEART CATH AND CORS/GRAFTS ANGIOGRAPHY N/A 10/01/2017   Procedure: LEFT HEART CATH AND CORS/GRAFTS ANGIOGRAPHY;  Surgeon: Burnell Blanks, MD;  Location: [facility];  Service: Cardiovascular;  Laterality: N/A;  . LEFT HEART CATHETERIZATION WITH CORONARY/GRAFT ANGIOGRAM N/A 05/15/2013   Procedure: LEFT HEART CATHETERIZATION WITH Beatrix Fetters;  Surgeon: [name], MD;  Location: [facility];  Service: Cardiovascular;  Laterality: N/A;  . PERIPHERAL VASCULAR INTERVENTION  09/23/2016   Procedure: Peripheral Vascular Intervention;  Surgeon: Wellington Hampshire, MD;  Location: [facility];  Service: Cardiovascular;;  Lt. SFA   HPI:  Pt is a 58 y.o. male with medical history significant of hypertension, hyperlipidemia, diabetes, BPH, tobacco use, OSA, anxiety, depression pancreatitis, PAD, CAD, diastolic heart failure, and CVA who presented to the ED with worsening right-sided weakness, unsteady gait, and slurred speech. MRI of the brain showed acute lacunar infarct in the dorsal left thalamus.    Assessment / Plan / Recommendation Clinical Impression  Pt reported that he was indepedent prior to admission but was on disability. He denied any baseline or new deficits in speech or language and stated that his cognition has been grossly functional. However, per pt, he did "fail" the memory section (delayed recall) of his disability test and therefore compensates by writing things down. The [facility] Cognitive Assessment 8.1 was completed to evaluate the pt's cognitive-linguistic skills. He achieved a score of 26/30 which is within the normal limits of 26 or more out of 30 and no speech/language deficits were demonstrated.  Further skilled SLP services are not clinically indicated at this time. Pt and nursing were educated regarding this and both parties verbalized understanding as well as agreement with plan of care.    SLP Assessment  SLP Recommendation/Assessment: Patient does not need any further Speech Lanaguage Pathology Services SLP Visit Diagnosis: Cognitive communication deficit (R41.841)    Follow Up Recommendations  None    Frequency and Duration           SLP Evaluation Cognition  Overall Cognitive Status: Within Functional Limits for tasks assessed Arousal/Alertness: Awake/alert Orientation Level: Oriented X4 Attention: Focused;Sustained Focused Attention: Appears intact(Vigilance WNL: 1/1) Sustained Attention: Impaired Sustained Attention Impairment: Verbal complex(Serial 7s: 2/3) Memory: Impaired Memory Impairment: Decreased recall of new information;Retrieval deficit(Immediate: 4/5; Delayed: 1/5; 4/4 with cues) Awareness: Appears intact Problem Solving: Appears intact Executive Function: Reasoning;Sequencing Reasoning: Appears intact(Abstraction: 2/2) Sequencing: Appears intact(Clock drawing: 3/3)       Comprehension  Auditory Comprehension Overall Auditory Comprehension: Appears within functional limits for tasks assessed Yes/No Questions: Within Functional Limits Commands: Within Functional Limits(Complex commands- trail completion: 1/1) Conversation: Complex Reading Comprehension Reading Status: Not tested    Expression Expression Primary Mode of Expression: Verbal Verbal Expression Overall Verbal Expression: Appears within functional limits for tasks  assessed Initiation: No impairment Level of Generative/Spontaneous Verbalization: Sentence;Conversation Repetition: No impairment(2/2) Naming: No impairment(Confrontational: 3/3; Divergent: 1/1) Pragmatics: No impairment Written Expression Dominant Hand: Right Written Expression: (Copying cube: 1/1)   Oral / Motor   Motor Speech Overall Motor Speech: Appears within functional limits for tasks assessed Respiration: Within functional limits Phonation: Normal Resonance: Within functional limits Articulation: Within functional limitis Intelligibility: Intelligible Motor Planning: Witnin functional limits Motor Speech Errors: Not applicable   Thereasa Iannello I. Hardin Negus, [place], [place] Office number [phone] Pager [place] 11/28/2018, 4:04 PM

## 2018-11-28 NOTE — Telephone Encounter (Signed)
FYI

## 2018-11-28 NOTE — TOC Initial Note (Signed)
Transition of Care (TOC) - Initial/Assessment Note    Patient Details  Name: [name]. MRN: [id number] Date of Birth: 10/19/1960  Transition of Care [facility]) CM/SW Contact:    Pollie Friar, RN Phone Number: 11/28/2018, 4:12 PM  Clinical Narrative:                   Expected Discharge Plan: IP Rehab Facility Barriers to Discharge: No Barriers Identified   Patient Goals and CMS Choice Patient states their goals for this hospitalization and ongoing recovery are:: ready to get home CMS Medicare.gov Compare Post Acute Care list provided to:: Patient Choice offered to / list presented to : Patient  Expected Discharge Plan and Services Expected Discharge Plan: [place]   Discharge Planning Services: CM Consult Post Acute Care Choice: Home Health, Durable Medical Equipment   Expected Discharge Date: 11/28/18               DME Arranged: [place]ford Rile rolling DME Agency: [facility] HH Arranged: RN, PT, OT HH Agency: [facility]  Prior Living Arrangements/Services   Lives with:: Self Patient language and need for interpreter reviewed:: Yes(no needs) Do you feel safe going back to the place where you live?: Yes      Need for Family Participation in Patient Care: Yes (Comment)(24 hour supervision) Care giver support system in place?: Yes (comment)(pt is going to have his girlfriend stay with him at home)   Criminal Activity/Legal Involvement Pertinent to Current Situation/Hospitalization: No - Comment as needed  Activities of Daily Living      Permission Sought/Granted                  Emotional Assessment Appearance:: Appears older than stated age Attitude/Demeanor/Rapport: Engaged Affect (typically observed): Accepting, Appropriate, Pleasant Orientation: : Oriented to Self, Oriented to Place, Oriented to  Time, Oriented to Situation Alcohol / Substance Use: Tobacco Use Psych Involvement: No (comment)  Admission diagnosis:  Acute ischemic stroke  [facility]) [I63.9] Patient Active Problem List   Diagnosis Date Noted  . Hiatal hernia   . Hepatic steatosis   . Hemianopia, homonymous, right   . ETOH abuse   . Esophagitis   . Chronic diastolic CHF (congestive heart failure) ([place])   . Chronic bronchitis ([place])   . CAD (coronary artery disease)   . PAD (peripheral artery disease) ([place]) 09/23/2016  . Morbid obesity ([place])   . Chronic pain syndrome 10/23/2015  . GERD (gastroesophageal reflux disease) 08/07/2015  . Pancreatitis, acute   . PCP NOTES >>>>> 06/01/2015  . Annual physical exam 05/31/2015  . Acute ischemic stroke ([place]) 02/20/2015  . Hx of cocaine abuse ([place]) 02/20/2015  . BPH (benign prostatic hyperplasia) 02/20/2015  . OSA (obstructive sleep apnea) ?? 05/05/2013  . Poor compliance 05/05/2013  . Anxiety and depression 05/05/2013  . Erectile dysfunction 03/17/2012  . Emphysema (subcutaneous) (surgical) resulting from a procedure 10/08/2008  . Iron deficiency anemia 08/10/2008  . ABNORMALITY OF GAIT 08/06/2008  . TOBACCO ABUSE 07/27/2008  . Dyslipidemia 07/26/2008  . HYPERTENSION, BENIGN ESSENTIAL 07/26/2008  . ST elevation myocardial infarction involving left circumflex coronary artery ([place]) 07/26/2008   PCP:  Colon Branch, MD Pharmacy:   [facility], [address] [place] [address] Phone: [phone] Fax: [phone]  [facility] - [address], [place] [address] Phone: [phone] Fax: [phone]  Social Determinants of Health (SDOH) Interventions    Readmission Risk Interventions No flowsheet data found.

## 2018-11-28 NOTE — Progress Notes (Signed)
Inpatient Rehab Admissions:  Inpatient Rehab Consult received.  I met with patient at the bedside for rehabilitation assessment and to discuss goals and expectations of an inpatient rehab admission.  He is not interested in an inpatient rehab stay or SNF.  Note that pt ambulated 200' with min guard and RW today with therapy.  He also reports that he has good family support between his son, significant other, and daughter and would benefit from f/u with home health therapies at d/c from acute care.  Will sign off at this time.   Signed: Shann Medal, PT, DPT Admissions Coordinator [phone] 11/28/18  12:27 PM

## 2018-11-28 NOTE — Progress Notes (Signed)
STROKE TEAM PROGRESS NOTE   SUBJECTIVE (INTERVAL HISTORY) Patient sitting in chair, stated that his right-sided numbness has been resolved.  He admitted that he is still smoking and alcohol use, as well as THC use.  But denies any cocaine or heroin.  He stated that he compliant with aspirin Plavix and statin.  OBJECTIVE Vitals:   11/27/18 1957 11/28/18 0054 11/28/18 0500 11/28/18 0822  BP:  139/84 (!) 145/85 (!) 167/98  Pulse:   77 76  Resp:   18 (!) 22  Temp: 98.2 F (36.8 C) 98.4 F (36.9 C) 98 F (36.7 C) 98.2 F (36.8 C)  TempSrc: Oral Oral Oral Oral  SpO2:   96% 94%  Weight:      Height:        CBC:  Recent Labs  Lab 11/26/18 1222  WBC 7.7  NEUTROABS 5.4  HGB 17.4*  HCT 53.7*  MCV 98.5  PLT 462    Basic Metabolic Panel:  Recent Labs  Lab 11/26/18 1222  NA 135  K 4.6  CL 95*  CO2 24  GLUCOSE 104*  BUN 9  CREATININE 0.91  CALCIUM 9.4    Lipid Panel:     Component Value Date/Time   CHOL 141 11/27/2018 0441   CHOL 160 08/24/2017 1438   TRIG 175 (H) 11/27/2018 0441   HDL 33 (L) 11/27/2018 0441   HDL 39 (L) 08/24/2017 1438   CHOLHDL 4.3 11/27/2018 0441   VLDL 35 11/27/2018 0441   LDLCALC 73 11/27/2018 0441   LDLCALC 83 08/24/2017 1438   HgbA1c:  Lab Results  Component Value Date   HGBA1C 6.3 (H) 11/27/2018   Urine Drug Screen:     Component Value Date/Time   LABOPIA NONE DETECTED 11/26/2018 1855   COCAINSCRNUR NONE DETECTED 11/26/2018 1855   LABBENZ NONE DETECTED 11/26/2018 1855   AMPHETMU NONE DETECTED 11/26/2018 1855   THCU POSITIVE (A) 11/26/2018 1855   LABBARB NONE DETECTED 11/26/2018 1855    Alcohol Level No results found for: ETH  IMAGING Ct Angio Head W Or Wo Contrast Ct Angio Neck W And/or Wo Contrast 11/26/2018 IMPRESSION:  1. No large vessel occlusion.  2. Cervical carotid artery atherosclerosis without significant stenosis.  3. Chronic moderate to severe right vertebral artery origin stenosis.  4. Mild intracranial  atherosclerosis without flow limiting proximal stenosis.  5.  Aortic Atherosclerosis (ICD10-I70.0).  Ct Head Wo Contrast 11/26/2018 IMPRESSION:  1. Age indeterminate bilateral basal ganglia lacunar infarcts, but most likely subacute to remote. No hemorrhage.  2. Atrophy, chronic small-vessel white matter ischemic changes and remote RIGHT cerebellar and LEFT thalamic infarcts.   Mr Brain Wo Contrast 11/26/2018 IMPRESSION:  1. Acute lacunar infarct in the dorsal left thalamus. No associated hemorrhage or mass effect. Underlying chronic but substantially increased since 2016 bilateral deep gray matter nuclei and brainstem small vessel disease.  2. Superimposed punctate acute right cerebellar infarct with chronic regional right cerebellar ischemia stable to mildly progressed since 2016.   Dg Chest Port 1 View 11/26/2018 IMPRESSION:  No active disease.    Transthoracic Echocardiogram With Bubble Study 00/00/2020 IMPRESSIONS  1. The left ventricle has normal systolic function with an ejection fraction of 60-65%. The cavity size was normal. There is mildly increased left ventricular wall thickness. Left ventricular diastolic parameters were normal.  2. Inferobasal hypokinesis.  3. The right ventricle has normal systolic function. The cavity was normal. There is no increase in right ventricular wall thickness.  4. Left atrial size was moderately dilated.  5. Mild thickening of the mitral valve leaflet. Mild calcification of the mitral valve leaflet.  6. The tricuspid valve is grossly normal.  7. The aortic valve was not well visualized. Moderate thickening of the aortic valve. Sclerosis without any evidence of stenosis of the aortic valve. No stenosis of the aortic valve.  EKG - SR rate 78 BPM. (See cardiology reading for complete details)   PHYSICAL EXAM  Temp:  [98 F (36.7 C)-98.4 F (36.9 C)] 98 F (36.7 C) (04/20 1229) Pulse Rate:  [76-86] 86 (04/20 1229) Resp:  [18-22] 20 (04/20  1229) BP: (139-167)/(84-98) 154/87 (04/20 1229) SpO2:  [94 %-98 %] 98 % (04/20 1229)  General - Well nourished, well developed, in no apparent distress.  Ophthalmologic - fundi not visualized due to noncooperation.  Cardiovascular - Regular rate and rhythm.  Mental Status -  Level of arousal and orientation to time, place, and person were intact. Language including expression, naming, repetition, comprehension was assessed and found intact.  Mild dysarthria  Cranial Nerves II - XII - II - Visual field intact OU, no hemianopia. III, IV, VI - Extraocular movements intact. V - Facial sensation intact bilaterally. VII - Facial movement intact bilaterally. VIII - Hearing & vestibular intact bilaterally. X - Palate elevates symmetrically, mild dysarthria. XI - Chin turning & shoulder shrug intact bilaterally. XII - Tongue protrusion intact.  Motor Strength - The patient's strength was normal in all extremities and pronator drift was absent.  Bulk was normal and fasciculations were absent.   Motor Tone - Muscle tone was assessed at the neck and appendages and was normal.  Reflexes - The patient's reflexes were symmetrical in all extremities and he had no pathological reflexes.  Sensory - Light touch, temperature/pinprick were assessed and were symmetrical.    Coordination - The patient had normal movements in the handswith no ataxia or dysmetria.  Tremor was absent.  Gait and Station - deferred.    ASSESSMENT/PLAN [name]. is a 58 y.o. male with history of MI, morbid obesity, CVA (2016, 2008 w/ residual right side motor and sensory deficits following the 1st stroke, residual right homonymous hemianopsia following the 2nd stroke), HTN, ASPVD, HLD, CHF, CAD, ETOH, emphysema on home O2, tobacco use, OSA  and cocaine abuse presenting with new onset slurred speech and left sided numbness. He did not receive IV t-PA due to late presentation.  Stroke: Dorsal left thalamic  lacune likely d/t small vessel disease  CT head - Age indeterminate bilateral basal ganglia lacunar infarcts, but most likely subacute to remote. Remote RIGHT cerebellar and LEFT thalamic infarcts.   MRI head - Acute lacunar infarct in the dorsal left thalamus.  CTA H&N - Chronic moderate to severe right vertebral artery origin stenosis.  Bilateral siphon atherosclerosis, left more than right  2D Echo  - EF 60 - 65%. No cardiac source of emboli identified.   LDL - 73  HgbA1c - 6.3  UDS - positive for THCU  VTE prophylaxis - SCDs  Diet  - Heart healthy with thin liquids.  aspirin 81 mg daily and clopidogrel 75 mg daily prior to admission, now on aspirin 81 mg daily and clopidogrel 75 mg daily . Continue at d/c  Therapy recommendations:  CIR->HH w/ improvement  Disposition:  Pending  Hx stroke/TIA  02/2015 -admitted for right hemianopia.  Non-Dominant left thalamic extending to optic radiation and left SO subcortical infarcst likely secondary to small vessel disease.  MRA, CUS, TTE unremarkable.  LDL  113.  Aspirin 81 changed to 325+ Zocor  2008, patient reported a stroke affecting right motor/sensory deficit.  MRI showed right cerebellum vermis punctate infarcts and right lateral medullary infarct  Hypertension  Stable . Permissive hypertension (OK if < 220/120) but gradually normalize in 3-5 days . Long-term BP goal normotensive  Hyperlipidemia  Lipid lowering medication PTA:  Lipitor 80 mg daily and Fenofibrate 145 mg daily  LDL 73, goal < 70  Current lipid lowering medication:  Lipitor 80 mg daily and fenofibrate 60  Continue statin and fenofibrate at discharge  Tobacco abuse  Current smoker  Smoking cessation counseling provided  Nicotine patch provided  Pt is willing to quit  Alcohol use  Patient stated 4 beers every day  Asking patient to limit drink to 1-2 drinks per day  On MVI/FA/vitamin B1  Other Stroke Risk Factors  Obesity, Body mass index  is 31.8 kg/m., recommend weight loss, diet and exercise as appropriate   Coronary artery disease  Obstructive sleep apnea  UDS showed THC use  NOTHING FURTHER TO ADD FROM THE STROKE STANDPOINT Patient has a 10-15% risk of having another stroke over the next year, the highest risk is within 2 weeks of the most recent stroke/TIA (risk of having a stroke following a stroke or TIA is the same). Ongoing risk factor control by Primary Care Physician Stroke Service will sign off. Please call should any needs arise. Follow-up Stroke Clinic at [facility] in 4 weeks, order placed.  Hospital day # 2  Rosalin Hawking, MD PhD Stroke Neurology 11/28/2018 3:56 PM    To contact Stroke Continuity provider, please refer to [email or web address] After hours, contact General Neurology

## 2018-11-28 NOTE — TOC Transition Note (Addendum)
Transition of Care [facility]) - CM/SW Discharge Note   Patient Details  Name: [name]. MRN: [id number] Date of Birth: 1961-04-21  Transition of Care [facility]) CM/SW Contact:  Pollie Friar, RN Phone Number: 11/28/2018, 4:12 PM   Clinical Narrative:    Recommendations was for CIR. Pt is walking 200 feet min assist with walker. Pt choosing to d/c home instead of rehab. CM spoke to his daughter in law and went over note from [place]. She is aware he is choosing to d/c home with [facility] services.  Family to provide transport home.  James with [facility] to deliver DME to the room.    Final next level of care: [place] Barriers to Discharge: No Barriers Identified   Patient Goals and CMS Choice Patient states their goals for this hospitalization and ongoing recovery are:: ready to get home CMS Medicare.gov Compare Post Acute Care list provided to:: Patient Choice offered to / list presented to : Patient  Discharge Placement                       Discharge Plan and Services   Discharge Planning Services: CM Consult Post Acute Care Choice: Home Health, Durable Medical Equipment          DME Arranged: Walker rolling DME Agency: [facility] HH Arranged: RN, PT, OT HH Agency: [facility]--Ell[facility] with [facility] accepted the referral.   Social Determinants of Health (SDOH) Interventions     Readmission Risk Interventions No flowsheet data found.

## 2018-11-28 NOTE — Telephone Encounter (Signed)
Currently at the hospital

## 2018-11-28 NOTE — Evaluation (Signed)
Occupational Therapy Evaluation Patient Details Name: [name]. MRN: [id number] DOB: May 06, 1961 Today's Date: 11/28/2018    History of Present Illness [name]. is an 58 y.o. male  With PMH MI, morbid obesity, CVA (2016, 2008 w/ residual right side motor and sensory deficits following the 1st stroke, residual right homonymous hemianopsia following the 2nd stroke), HTN, HLD, CHF, CAD, ETOH and cocaine abuse who presented to [facility] ED with c/o new onset slurred speech and right sided numbness. CT showed new lacunar basal ganglia infarcts. Of note also, pt with emphysema with SOB at baseline, not on supplemental O2 at home.    Clinical Impression   PTA patient reports independent and driving.  Admitted for above and limited by problem list below, including impaired balance, impaired safety awareness and awareness of deficits, decreased problem solving, R sided numbness.  Patient able to complete UB ADLs with setup assist, LB ADLs with min assist, in room mobility and functional transfers with min assist using RW, and grooming at sink with min guard assist.  Patient reports he is able to have 24/7 support at discharge if needed, highly recommend CIR level rehab in order to maximize safety and independence with ADLs/mobility/IADLs prior to dc home.  Will follow, with plans to assess functional cognition using pill box test.      Follow Up Recommendations  CIR;Supervision/Assistance - 24 hour(if pt declines, will need [facility] services )    Equipment Recommendations  3 in 1 bedside commode    Recommendations for Other Services Rehab consult     Precautions / Restrictions Precautions Precautions: Fall Precaution Comments: fell prior to coming in landing on both knees but R worse than L Restrictions Weight Bearing Restrictions: No      Mobility Bed Mobility               General bed mobility comments: sitting EOB upon entry   Transfers Overall transfer level: Needs  assistance Equipment used: Rolling walker (2 wheeled) Transfers: Sit to/from Stand Sit to Stand: Min assist         General transfer comment: min assist for safety and impulsivity, cueing for hand placement with poor carryover in technique     Balance Overall balance assessment: Needs assistance;History of Falls Sitting-balance support: No upper extremity supported Sitting balance-Leahy Scale: Good     Standing balance support: No upper extremity supported;During functional activity Standing balance-Leahy Scale: Poor Standing balance comment: min guard for safety at sink standing for ADLs                            ADL either performed or assessed with clinical judgement   ADL Overall ADL's : Needs assistance/impaired     Grooming: Min guard;Standing;Wash/dry hands;Oral care   Upper Body Bathing: Supervision/ safety;Set up;Sitting   Lower Body Bathing: Sit to/from stand;Minimal assistance   Upper Body Dressing : Set up;Sitting   Lower Body Dressing: Sit to/from stand;Minimal assistance   Toilet Transfer: Minimal assistance;Ambulation;RW Toilet Transfer Details (indicate cue type and reason): simulated in room          Functional mobility during ADLs: Minimal assistance;Rolling walker General ADL Comments: pt with limited safety awareness and impaired balance      Vision Patient Visual Report: No change from baseline(hx of R homonymous hemianopsia from previous CVA) Additional Comments: functional, able to locate items throughout room with supervision; pt reports baseline      Perception  Praxis      Pertinent Vitals/Pain Pain Assessment: No/denies pain     Hand Dominance Right   Extremity/Trunk Assessment Upper Extremity Assessment Upper Extremity Assessment: RUE deficits/detail RUE Deficits / Details: 4/5 MMT, pt reports numbness/tingling; functional use  RUE Sensation: (pt reports numb/tingly) RUE Coordination: WNL   Lower Extremity  Assessment Lower Extremity Assessment: Defer to PT evaluation       Communication Communication Communication: No difficulties   Cognition Arousal/Alertness: Awake/alert Behavior During Therapy: WFL for tasks assessed/performed Overall Cognitive Status: No family/caregiver present to determine baseline cognitive functioning Area of Impairment: Attention;Following commands;Memory;Safety/judgement;Awareness;Problem solving                   Current Attention Level: Sustained Memory: Decreased recall of precautions;Decreased short-term memory Following Commands: Follows one step commands consistently;Follows one step commands with increased time;Follows multi-step commands inconsistently Safety/Judgement: Decreased awareness of deficits;Decreased awareness of safety Awareness: Emergent Problem Solving: Difficulty sequencing;Requires verbal cues General Comments: pt with poor safety awareness, poor awareness of deficits; pt reports he is at baseline    General Comments  VSS    Exercises     Shoulder Instructions      Home Living Family/patient expects to be discharged to:: Private residence Living Arrangements: Alone Available Help at Discharge: Family;Available PRN/intermittently Type of Home: Mobile home Home Access: Stairs to enter Entrance Stairs-Number of Steps: 4 Entrance Stairs-Rails: Right;Left;Can reach both Home Layout: One level     Bathroom Shower/Tub: Teacher, early years/pre: Standard     Home Equipment: None   Additional Comments: pt reports that his son can stay with him or he can stay with his son short term if needed      Prior Functioning/Environment Level of Independence: Independent        Comments: drives        OT Problem List: Decreased activity tolerance;Impaired balance (sitting and/or standing);Impaired vision/perception;Decreased safety awareness;Decreased cognition;Decreased knowledge of use of DME or AE;Decreased  knowledge of precautions;Impaired sensation      OT Treatment/Interventions: Self-care/ADL training;Neuromuscular education;DME and/or AE instruction;Therapeutic activities;Cognitive remediation/compensation;Patient/family education;Balance training;Visual/perceptual remediation/compensation    OT Goals(Current goals can be found in the care plan section) Acute Rehab OT Goals Patient Stated Goal: return home OT Goal Formulation: With patient Time For Goal Achievement: 12/12/18 Potential to Achieve Goals: Good  OT Frequency: Min 3X/week   Barriers to D/C:            Co-evaluation              AM-PAC OT "6 Clicks" Daily Activity     Outcome Measure Help from another person eating meals?: None Help from another person taking care of personal grooming?: A Little Help from another person toileting, which includes using toliet, bedpan, or urinal?: A Little Help from another person bathing (including washing, rinsing, drying)?: A Little Help from another person to put on and taking off regular upper body clothing?: None Help from another person to put on and taking off regular lower body clothing?: A Little 6 Click Score: 20   End of Session Equipment Utilized During Treatment: Gait belt;Rolling walker Nurse Communication: Mobility status  Activity Tolerance: Patient tolerated treatment well Patient left: in chair;with call bell/phone within reach;Other (comment)(PT in room)  OT Visit Diagnosis: Other abnormalities of gait and mobility (R26.89);Muscle weakness (generalized) (M62.81)                Time: 7619-5093 OT Time Calculation (min): 29 min Charges:  OT General  Charges $OT Visit: 1 Visit OT Evaluation $OT Eval Moderate Complexity: 1 Mod OT Treatments $Self Care/Home Management : 8-22 mins  Delight Stare, OT Acute Rehabilitation Services Pager [phone] Office [phone]   Delight Stare 11/28/2018, 10:35 AM

## 2018-11-28 NOTE — Progress Notes (Signed)
RT called to pt room d/t pt having issues with CPAP mask. Upon arrival pts mask and tubing was off and in the floor (pt had pulled off). RT asked pt what the issue was with the mask and he stated that he wears a nasal mask at home. RT replaced full face mask with nasal mask pt now resting comfortably. RT will continue to monitor.

## 2018-11-28 NOTE — Discharge Summary (Signed)
Kenneth Massie., is a 58 y.o. male  DOB 1960-10-15  MRN [id number].  Admission date:  11/26/2018  Admitting Physician  Lenna Sciara, MD  Discharge Date:  11/28/2018   Primary MD  Colon Branch, MD  Recommendations for primary care physician for things to follow:   1) quit smoking 2) please take medications as prescribed 3) you refused to go to rehab/physical therapy facility as advised----a physical therapist will be  coming to your house to help you with your rehab 4) compliance with dietary and lifestyle recommendations strongly encouraged-- 5) low-salt, low-cholesterol low-fat diet strongly advised 6) please use your CPAP every bedtime 7) significant moderation of alcohol intake advised 8) you are taking aspirin and Plavix which are blood thinners so please Avoid ibuprofen/Advil/Aleve/Motrin/Goody Powders/Naproxen/BC powders/Meloxicam/Diclofenac/Indomethacin and other Nonsteroidal anti-inflammatory medications as these will make you more likely to bleed and can cause stomach ulcers, can also cause Kidney problems  Admission Diagnosis  Acute ischemic stroke [facility]) [I63.9]   Discharge Diagnosis  Acute ischemic stroke [facility]) [I63.9]    Principal Problem:   Acute ischemic [facility])      Past Medical History:  Diagnosis Date   Abnormality of gait    Anxiety    CAD (coronary artery disease)    a. 10/2008 Inferolateral STEMI: 3VD w/ occluded LCX (PTCA)-->CABG x 3 (LIMA->LAD, VG->OM, VG->PDA);  b. 06/2009 NSTEMI/PCI: VG->OM 100 (BMS);  c. 05/2013 native 3VD, 3/3 patent grafts. d. 06/2016: Inferior STEMI w/ occluded SVG-OM (DES placed). LIMA-LAD and SVG-PDA patent.   Chronic bronchitis (HCC)    Chronic diastolic CHF (congestive heart failure) ([place])    a. 02/2015 Echo: EF 50-55%, distal septal, apical, inferobasal HK, mild LVH, mild MR, mildly dil LA.   Cocaine use    Colon polyps    a. 09/2015  colonscopy: multiple sessile polyps, path negative for high grade dysplasia.   Depression    Emphysema (subcutaneous) (surgical) resulting from a procedure 10/2008.   Bleb resected at time of 10/2008 CABG. Marketed emphysema noted on surgical report.   Esophagitis    a. 2017 EGD: esophagitis, duodenitis.   ETOH abuse    GERD (gastroesophageal reflux disease)    Hemianopia, homonymous, right    "can't see out the right side of either eye since stroke in 2016/pt description 09/23/2016   Hepatic steatosis    Hiatal hernia    History of nuclear stress test 08/2017   Nuclear stress test 1/19: EF 35, inf-lat and ant-lat, apical sept/apical scar; no ischemia; Intermediate Risk   Hyperlipidemia    Hypertension    Iron deficiency anemia 2010   Morbid obesity ([place])    Myocardial infarction [facility]) "several"   PAD (peripheral artery disease) ([place])    Pancreatitis 06/2015   Sleep apnea    "don't have a mask" (09/23/2016)   Stroke ([place]) 02/2015   a. 02/2015 Carotid U/S: 1-39% bilat ICA stenosis; "left me blind" (09/23/2016)   Tobacco abuse    still smokes   Tubular adenoma of colon  Past Surgical History:  Procedure Laterality Date   ABDOMINAL AORTOGRAM W/LOWER EXTREMITY N/A 09/23/2016   Procedure: Abdominal Aortogram w/Lower Extremity;  Surgeon: Wellington Hampshire, MD;  Location: [place] CV LAB;  Service: Cardiovascular;  Laterality: N/A;   CARDIAC CATHETERIZATION  11/07/2008   EF of 50-55% -- normal LV systolic function, acute inferolateral ST elevation MI,  PTCA of the circumflex artery -- [name]. [name], [name]   CARDIAC CATHETERIZATION N/A 07/07/2016   Procedure: Left Heart Cath and Cors/Grafts Angiography;  Surgeon: [name], MD;  Location: [place] CV LAB;  Service: Cardiovascular;  Laterality: N/A;   CARDIAC CATHETERIZATION N/A 07/07/2016   Procedure: Coronary Stent Intervention;  Surgeon: [name], MD;  Location: [place] CV LAB;  Service:  Cardiovascular;  Laterality: N/A;   CORONARY ANGIOPLASTY     CORONARY ARTERY BYPASS GRAFT     "CABG X4"   LEFT HEART CATH AND CORS/GRAFTS ANGIOGRAPHY N/A 10/01/2017   Procedure: LEFT HEART CATH AND CORS/GRAFTS ANGIOGRAPHY;  Surgeon: Burnell Blanks, MD;  Location: [place] CV LAB;  Service: Cardiovascular;  Laterality: N/A;   LEFT HEART CATHETERIZATION WITH CORONARY/GRAFT ANGIOGRAM N/A 05/15/2013   Procedure: LEFT HEART CATHETERIZATION WITH Beatrix Fetters;  Surgeon: [name], MD;  Location: [facility] CATH LAB;  Service: Cardiovascular;  Laterality: N/A;   PERIPHERAL VASCULAR INTERVENTION  09/23/2016   Procedure: Peripheral Vascular Intervention;  Surgeon: Wellington Hampshire, MD;  Location: [place] CV LAB;  Service: Cardiovascular;;  Lt. SFA       HPI  from the history and physical done on the day of admission:    Chief Complaint: Right-sided numbness and weakness  HPI: Kenneth Marucci. is a 58 y.o. male with medical history significant of hypertension, hyperlipidemia, diabetes, BPH, tobacco use, OSA, anxiety, depression pancreatitis, PAD, CAD, diastolic heart failure, CVA resented with worsening right-sided weakness, unsteady gait, slurred speech  Per patient, his last known normal was yesterday noon, when he wake up around 1 PM, he found he has difficulty speaking and also have right-sided weakness and numbness, also he had unsteady gait and fell, but not hurt his head.  He said he had some dry cough, no fever no chills, no chest pain, no shortness of breath.  No sick contacts.  He went to the ED today  ED Course: pt was found to have temperature 98, pulse rate 76, respiratory 20, blood pressure 169/94, oxygen saturation 95% on room air basically unremarkable CMP, except total bilirubin 2.1, BNP 167.9, troponin less than 0.03, basically unremarkable CBC except hemoglobin 17.4, PT/INR within normal range, CT head: Age indeterminate bilateral basal ganglia  lacunar infarcts, but most likely subacute to remote. No hemorrhage. Atrophy, chronic small-vessel white matter ischemic changes and remote RIGHT cerebellar and LEFT thalamic infarcts. Chest X-ray no acute intrathoracic abnormality. EKG reviewed by me: Sinus rhythm, no significant ischemic change.  Consults neurology, recommends MRI brain, CTA head and neck, echo hospitalist  was requested medication   Hospital Course:   Brief Summary 58 y.o.malewith medical history significant ofhypertension, hyperlipidemia, diabetes, BPH, tobacco use, OSA, anxiety, depression pancreatitis, PAD, CAD, diastolic heart failure, prior CVA (2008 and 2016 with some residual right-sided motor and sensory deficits after the first stroke, and residual right homonymous hemianopsia following the second stroke) admitted on 11/26/2018 with worsening right-sided weakness, unsteady gait,slurred speech found to have acute lacunar infarcts in the distal left thalamic/acute right cerebellar infarct  A/p  1)Acute CVA/Brain MRI with  acute Lacunar infarcts  in the distal left thalamic/acute right cerebellar infarct--  echo with EF of 60 to 65%, inferobasal hypokinesis, continue aspirin 81 mg daily, Plavix 75 mg daily, Lipitor 80 mg daily and fenofibrate 160 mg daily, , CTA without LVO, A1c 6.3, HDL is 33, LDL is 73  2)Tobacco abuse --patient is a 2  pack-a-day smoker (he rolls his own cig), smoking cessation advised, c/n nicotine patch  3)CAD/PAD-no chest pains or ACS symptoms, continue aspirin, Plavix, fenofibrate and Lipitor,    4)Obesity/OSA--- recommend CPAP nightly  5)Depression/Anxiety--- stable, continue trazodone 50 mg nightly  6)HTN-may resume BP meds in a.m.  7)H/o EToh/Cocaine Abuse-no evidence of frank DTs this time, continue thiamine and folic acid  8)COPD/emphysema--stable, no acute exacerbation, PRN albuterol as ordered  9)Social/Ethics--- PT recommends inpatient rehab, patient declines  inpatient rehab at this time he wants to leave AMA, d/w Pt's daughter-in-law [name] at patient request-- at [phone]--family trying to decide if patient should go to inpatient rehab or go home AMA--- patient refuses rehab at any facility, he is being discharged home with home health services  Disposition---- patient refuses rehab at any facility, he is being discharged home with home health services  Code Status : Full code  Family Communication:    daughter-in-law [name] at patient request-- at [phone]-  Consults  :  Neuro  Discharge Condition: stable  Follow UP  Follow-up Information    [facility] Follow up in 4 week(s).   Specialty:  Neurology Why:  stroke clinic. office will call with appt date and time Contact information: [place] [phone]       [facility] Follow up.   Why:  [phone]          Diet and Activity recommendation:  As advised  Discharge Instructions    Discharge Instructions    Ambulatory referral to Neurology   Complete by:  As directed    Follow up with stroke clinic NP (Jessica Vanschaick or Cecille Rubin, if both not available, consider Dr. Antony Contras, Dr. Bess Harvest, or Dr. Sarina Ill) at [facility] in about 4 weeks.   Call MD for:  difficulty breathing, headache or visual disturbances   Complete by:  As directed    Call MD for:  persistant dizziness or light-headedness   Complete by:  As directed    Call MD for:  persistant nausea and vomiting   Complete by:  As directed    Call MD for:  severe uncontrolled pain   Complete by:  As directed    Call MD for:  temperature >100.4   Complete by:  As directed    Diet - low sodium heart healthy   Complete by:  As directed    Discharge instructions   Complete by:  As directed    1) quit smoking 2) please take medications as prescribed 3) you  refused to go to rehab/physical therapy facility as advised----a physical therapist will be  coming to your house to help you with your rehab 4) compliance with dietary and lifestyle recommendations strongly encouraged-- 5) low-salt, low-cholesterol low-fat diet strongly advised 6) please use your CPAP every bedtime 7) significant moderation of alcohol intake advised 8) you are taking aspirin and Plavix which are blood thinners so please Avoid ibuprofen/Advil/Aleve/Motrin/Goody Powders/Naproxen/BC powders/Meloxicam/Diclofenac/Indomethacin and other Nonsteroidal anti-inflammatory medications as these will make you more likely to bleed and can cause stomach ulcers, can also cause Kidney problems.  Increase activity slowly   Complete by:  As directed         Discharge Medications     Allergies as of 11/28/2018   No Known Allergies     Medication List    STOP taking these medications   aspirin 81 MG chewable tablet Replaced by:  aspirin 81 MG EC tablet     TAKE these medications   aspirin 81 MG EC tablet Take 1 tablet (81 mg total) by mouth daily with breakfast. Replaces:  aspirin 81 MG chewable tablet   atorvastatin 80 MG tablet Commonly known as:  LIPITOR Take 1 tablet (80 mg total) by mouth daily at 6 PM. What changed:  See the new instructions.   augmented betamethasone dipropionate 0.05 % cream Commonly known as:  DIPROLENE-AF Apply topically 2 (two) times daily. Notes to patient:  Continue home schedule   clopidogrel 75 MG tablet Commonly known as:  PLAVIX Take one tablet by mouth daily. Please keep appointment in March for further refills   fenofibrate 145 MG tablet Commonly known as:  Tricor Take 1 tablet (145 mg total) by mouth daily.   folic acid 1 MG tablet Commonly known as:  FOLVITE Take 1 tablet (1 mg total) by mouth daily.   isosorbide mononitrate 60 MG 24 hr tablet Commonly known as:  IMDUR Take 1 tablet (60 mg total) by mouth daily. What changed:   additional instructions   lisinopril 5 MG tablet Commonly known as:  ZESTRIL Take 1 tablet (5 mg total) by mouth daily.   metoprolol tartrate 25 MG tablet Commonly known as:  LOPRESSOR Take 1 tablet (25 mg total) by mouth 2 (two) times daily. What changed:  additional instructions   multivitamin with minerals Tabs tablet Take 1 tablet by mouth daily. Start taking on:  November 29, 2018   nicotine 21 mg/24hr patch Commonly known as:  NICODERM CQ - dosed in mg/24 hours Place 1 patch (21 mg total) onto the skin daily at 6 PM.   nitroGLYCERIN 0.4 MG SL tablet Commonly known as:  Nitrostat Place 1 tablet (0.4 mg total) under the tongue every 5 (five) minutes as needed. PLACE 1 TABLET (0.4 MG TOTAL) UNDER THE TONGUE EVERY 5 MINUTES AS NEEDED FOR CHEST PAIN.   pantoprazole 40 MG tablet Commonly known as:  PROTONIX Take 1 tablet (40 mg total) by mouth daily. What changed:  See the new instructions.   thiamine 100 MG tablet Take 1 tablet (100 mg total) by mouth daily. Start taking on:  November 29, 2018   traZODone 50 MG tablet Commonly known as:  DESYREL Take 1 tablet (50 mg total) by mouth at bedtime.            Durable Medical Equipment  (From admission, onward)         Start     Ordered   11/28/18 1431  For home use only DME Walker rolling  Once    Question:  Patient needs a walker to treat with the following condition  Answer:  Stroke [facility])   11/28/18 1430          Major procedures and Radiology Reports - PLEASE review detailed and final reports for all details, in brief -   Ct Angio Head W Or Wo Contrast  Result Date: 11/26/2018 CLINICAL DATA:  Right-sided numbness since yesterday. History of CVA. EXAM: CT ANGIOGRAPHY HEAD AND NECK TECHNIQUE: Multidetector CT imaging of the head and neck was performed using the standard protocol during bolus  administration of intravenous contrast. Multiplanar CT image reconstructions and MIPs were obtained to evaluate the vascular  anatomy. Carotid stenosis measurements (when applicable) are obtained utilizing NASCET criteria, using the distal internal carotid diameter as the denominator. CONTRAST:  68mL OMNIPAQUE IOHEXOL 350 MG/ML SOLN COMPARISON:  Head MRA 02/20/2015 and neck MRA 12/06/2006 FINDINGS: CTA NECK FINDINGS Aortic arch: Standard 3 vessel aortic arch with mild atherosclerotic plaque. No significant arch vessel origin stenosis. Right carotid system: Patent with moderate soft and calcified plaque at the carotid bifurcation and in the distal cervical ICA. No evidence of significant stenosis or dissection. Left carotid system: Patent with moderate calcified and soft plaque at the carotid bifurcation and in the mid cervical ICA. No evidence of significant stenosis or dissection. Vertebral arteries: The vertebral arteries are patent with the left being moderately dominant. There is chronic moderate to severe stenosis of the right vertebral artery origin. No significant stenosis is seen on the left. Skeleton: No acute osseous abnormality or suspicious osseous lesion. Other neck: No mass or enlarged lymph nodes. Upper chest: Motion artifact and mild dependent atelectasis in both lung apices. Review of the MIP images confirms the above findings CTA HEAD FINDINGS Anterior circulation: The internal carotid arteries are patent from skull base to carotid termini with left greater than right siphon atherosclerosis resulting in up to mild left cavernous stenosis. ACAs and MCAs are patent without evidence of proximal branch occlusion or significant proximal stenosis. No aneurysm is identified. Posterior circulation: The intracranial vertebral arteries are patent to the basilar with mild atherosclerotic irregularity bilaterally but no significant stenosis. Patent left PICA, bilateral AICAs, and bilateral SCAs are identified. The basilar artery is patent without significant stenosis. PCAs are patent without evidence of significant stenosis. No  aneurysm is identified. Venous sinuses: As permitted by contrast timing, patent. Anatomic variants: None. Delayed phase: No abnormal enhancement. Review of the MIP images confirms the above findings IMPRESSION: 1. No large vessel occlusion. 2. Cervical carotid artery atherosclerosis without significant stenosis. 3. Chronic moderate to severe right vertebral artery origin stenosis. 4. Mild intracranial atherosclerosis without flow limiting proximal stenosis. 5.  Aortic Atherosclerosis (ICD10-I70.0). Electronically Signed   By: Logan Bores [name]   On: 11/26/2018 15:26   Ct Head Wo Contrast  Result Date: 11/26/2018 CLINICAL DATA:  58 year old male with acute RIGHT sided numbness. EXAM: CT HEAD WITHOUT CONTRAST TECHNIQUE: Contiguous axial images were obtained from the base of the skull through the vertex without intravenous contrast. COMPARISON:  02/20/2015 MR and 11/21/2006 CT FINDINGS: Brain: Bilateral basal ganglia lacunar infarcts are age indeterminate. No other evidence of acute infarction, hemorrhage, hydrocephalus, extra-axial collection or mass lesion/mass effect. Atrophy, chronic small-vessel white matter ischemic changes and remote RIGHT cerebellar and LEFT thalamic infarcts noted. Vascular: Atherosclerotic calcifications noted. Skull: Normal. Negative for fracture or focal lesion. Sinuses/Orbits: No acute finding. Other: None. IMPRESSION: 1. Age indeterminate bilateral basal ganglia lacunar infarcts, but most likely subacute to remote. No hemorrhage. 2. Atrophy, chronic small-vessel white matter ischemic changes and remote RIGHT cerebellar and LEFT thalamic infarcts. Electronically Signed   By: Margarette Canada [name]   On: 11/26/2018 13:50   Ct Angio Neck W And/or Wo Contrast  Result Date: 11/26/2018 CLINICAL DATA:  Right-sided numbness since yesterday. History of CVA. EXAM: CT ANGIOGRAPHY HEAD AND NECK TECHNIQUE: Multidetector CT imaging of the head and neck was performed using the standard protocol during  bolus administration of intravenous contrast. Multiplanar CT image reconstructions and MIPs were obtained to evaluate the vascular anatomy. Carotid  stenosis measurements (when applicable) are obtained utilizing NASCET criteria, using the distal internal carotid diameter as the denominator. CONTRAST:  72mL OMNIPAQUE IOHEXOL 350 MG/ML SOLN COMPARISON:  Head MRA 02/20/2015 and neck MRA 12/06/2006 FINDINGS: CTA NECK FINDINGS Aortic arch: Standard 3 vessel aortic arch with mild atherosclerotic plaque. No significant arch vessel origin stenosis. Right carotid system: Patent with moderate soft and calcified plaque at the carotid bifurcation and in the distal cervical ICA. No evidence of significant stenosis or dissection. Left carotid system: Patent with moderate calcified and soft plaque at the carotid bifurcation and in the mid cervical ICA. No evidence of significant stenosis or dissection. Vertebral arteries: The vertebral arteries are patent with the left being moderately dominant. There is chronic moderate to severe stenosis of the right vertebral artery origin. No significant stenosis is seen on the left. Skeleton: No acute osseous abnormality or suspicious osseous lesion. Other neck: No mass or enlarged lymph nodes. Upper chest: Motion artifact and mild dependent atelectasis in both lung apices. Review of the MIP images confirms the above findings CTA HEAD FINDINGS Anterior circulation: The internal carotid arteries are patent from skull base to carotid termini with left greater than right siphon atherosclerosis resulting in up to mild left cavernous stenosis. ACAs and MCAs are patent without evidence of proximal branch occlusion or significant proximal stenosis. No aneurysm is identified. Posterior circulation: The intracranial vertebral arteries are patent to the basilar with mild atherosclerotic irregularity bilaterally but no significant stenosis. Patent left PICA, bilateral AICAs, and bilateral SCAs are  identified. The basilar artery is patent without significant stenosis. PCAs are patent without evidence of significant stenosis. No aneurysm is identified. Venous sinuses: As permitted by contrast timing, patent. Anatomic variants: None. Delayed phase: No abnormal enhancement. Review of the MIP images confirms the above findings IMPRESSION: 1. No large vessel occlusion. 2. Cervical carotid artery atherosclerosis without significant stenosis. 3. Chronic moderate to severe right vertebral artery origin stenosis. 4. Mild intracranial atherosclerosis without flow limiting proximal stenosis. 5.  Aortic Atherosclerosis (ICD10-I70.0). Electronically Signed   By: Logan Bores [name]   On: 11/26/2018 15:26   Mr Brain Wo Contrast  Result Date: 11/26/2018 CLINICAL DATA:  57 year old male with right side numbness since yesterday. Previous cerebral small vessel ischemia. EXAM: MRI HEAD WITHOUT CONTRAST TECHNIQUE: Multiplanar, multiecho pulse sequences of the brain and surrounding structures were obtained without intravenous contrast. COMPARISON:  CTA head and neck earlier today.  Brain MRI 02/20/2015. FINDINGS: Brain: 12 millimeter curvilinear area of restricted diffusion in the dorsal left thalamus (series 6, image 27 and series 7, image 53) with T2 and FLAIR hyperintensity. No acute hemorrhage, mass effect. Additionally, punctate focus of restricted diffusion in the right mid cerebellar folia on series 5, image 65. No associated hemorrhage or mass effect. Stable to mildly progressed regional right cerebellar small chronic infarcts since 2016. Chronic but substantially increased since 2016 small vessel ischemia in the pons and bilateral deep gray matter nuclei. More mildly progressed chronic periventricular white matter T2 and FLAIR hyperintensity which is also probably small vessel ischemia related. There are occasional chronic micro hemorrhages in the cerebral hemispheres. No definite cortical encephalomalacia. No midline  shift, mass effect, evidence of mass lesion, ventriculomegaly, extra-axial collection or acute intracranial hemorrhage. Cervicomedullary junction and pituitary are within normal limits. Vascular: Major intracranial vascular flow voids are stable since 2016. Skull and upper cervical spine: Negative visible cervical spine. Visualized bone marrow signal is within normal limits. Sinuses/Orbits: Negative. Other: Mastoids remain clear.  Stable scalp  soft tissues. IMPRESSION: 1. Acute lacunar infarct in the dorsal left thalamus. No associated hemorrhage or mass effect. Underlying chronic but substantially increased since 2016 bilateral deep gray matter nuclei and brainstem small vessel disease. 2. Superimposed punctate acute right cerebellar infarct with chronic regional right cerebellar ischemia stable to mildly progressed since 2016. Electronically Signed   By: Genevie Ann [name]   On: 11/26/2018 18:17   Dg Chest Port 1 View  Result Date: 11/26/2018 CLINICAL DATA:  Shortness of breath EXAM: PORTABLE CHEST 1 VIEW COMPARISON:  01/05/2018 FINDINGS: There is no focal parenchymal opacity. There is no pleural effusion or pneumothorax. There is stable cardiomegaly. There is evidence of prior CABG. The osseous structures are unremarkable. IMPRESSION: No active disease. Electronically Signed   By: Kathreen Devoid   On: 11/26/2018 14:21    Micro Results   Today   Subjective    [name] today has no new complaints, pt just wants to go home,  No chest pains no palpitations no dizziness , he is adamant that he is going home today refuses transfer to rehab facility          Patient has been seen and examined prior to discharge   Objective   Blood pressure 139/89, pulse 79, temperature 98.3 F (36.8 C), temperature source Oral, resp. rate 15, height 5\' 11"  (1.803 m), weight 103.4 kg, SpO2 96 %.   Intake/Output Summary (Last 24 hours) at 11/28/2018 1801 Last data filed at 11/28/2018 0600 Gross per 24 hour  Intake  120 ml  Output 250 ml  Net -130 ml    Exam Gen:- Awake Alert,  In no apparent distress  HEENT:- [place].AT, No sclera icterus Neck-Supple Neck,No JVD,.  Lungs-  CTAB , fair symmetrical air movement CV- S1, S2 normal, regular  Abd-  +ve B.Sounds, Abd Soft, No tenderness,    Extremity/Skin:- No  edema, pedal pulses present = Psych-affect is appropriate, oriented x3 Neuro-old residual neuro deficits from previous strokes --some residual right-sided motor and sensory deficits after the first stroke, and residual right homonymous hemianopsia after second stroke, now with worsening right-sided weakness, worsening speech and gait problems   Data Review   CBC w Diff:  Lab Results  Component Value Date   WBC 7.7 11/26/2018   HGB 17.4 (H) 11/26/2018   HGB 17.4 09/20/2017   HCT 53.7 (H) 11/26/2018   HCT 48.7 09/20/2017   PLT 165 11/26/2018   PLT 146 (L) 09/20/2017   LYMPHOPCT 15 11/26/2018   BANDSPCT 2 02/20/2015   MONOPCT 13 11/26/2018   EOSPCT 1 11/26/2018   BASOPCT 0 11/26/2018    CMP:  Lab Results  Component Value Date   NA 135 11/26/2018   NA 138 09/20/2017   K 4.6 11/26/2018   CL 95 (L) 11/26/2018   CO2 24 11/26/2018   BUN 9 11/26/2018   BUN 10 09/20/2017   CREATININE 0.91 11/26/2018   CREATININE 0.86 09/15/2016   PROT 7.8 11/26/2018   PROT 7.1 08/24/2017   ALBUMIN 3.9 11/26/2018   ALBUMIN 4.2 08/24/2017   BILITOT 2.1 (H) 11/26/2018   BILITOT 1.0 08/24/2017   ALKPHOS 88 11/26/2018   AST 22 11/26/2018   ALT 19 11/26/2018  .   Total Discharge time is about 33 minutes  Roxan Hockey M.D on 11/28/2018 at 6:01 PM  Go to [email or web address] -  for contact info  [facility] - Office  [phone]

## 2018-11-29 NOTE — Telephone Encounter (Signed)
LMOM informing Pt to call back to schedule hosp f/u.

## 2018-11-30 ENCOUNTER — Telehealth: Payer: Self-pay

## 2018-11-30 NOTE — Telephone Encounter (Signed)
Called patient left message for return call to schedule hospital follow up.

## 2018-12-01 ENCOUNTER — Telehealth: Payer: Self-pay

## 2018-12-01 DIAGNOSIS — D509 Iron deficiency anemia, unspecified: Secondary | ICD-10-CM | POA: Diagnosis not present

## 2018-12-01 DIAGNOSIS — I739 Peripheral vascular disease, unspecified: Secondary | ICD-10-CM | POA: Diagnosis not present

## 2018-12-01 DIAGNOSIS — I110 Hypertensive heart disease with heart failure: Secondary | ICD-10-CM | POA: Diagnosis not present

## 2018-12-01 DIAGNOSIS — I2510 Atherosclerotic heart disease of native coronary artery without angina pectoris: Secondary | ICD-10-CM | POA: Diagnosis not present

## 2018-12-01 DIAGNOSIS — J439 Emphysema, unspecified: Secondary | ICD-10-CM | POA: Diagnosis not present

## 2018-12-01 DIAGNOSIS — I69328 Other speech and language deficits following cerebral infarction: Secondary | ICD-10-CM | POA: Diagnosis not present

## 2018-12-01 DIAGNOSIS — I69351 Hemiplegia and hemiparesis following cerebral infarction affecting right dominant side: Secondary | ICD-10-CM | POA: Diagnosis not present

## 2018-12-01 DIAGNOSIS — I5032 Chronic diastolic (congestive) heart failure: Secondary | ICD-10-CM | POA: Diagnosis not present

## 2018-12-01 DIAGNOSIS — G894 Chronic pain syndrome: Secondary | ICD-10-CM | POA: Diagnosis not present

## 2018-12-01 NOTE — Telephone Encounter (Signed)
[facility] follow up call made to patient to schedule appointment. Left message for return call.

## 2018-12-02 ENCOUNTER — Telehealth: Payer: Self-pay | Admitting: Internal Medicine

## 2018-12-02 ENCOUNTER — Telehealth: Payer: Self-pay

## 2018-12-02 DIAGNOSIS — D509 Iron deficiency anemia, unspecified: Secondary | ICD-10-CM | POA: Diagnosis not present

## 2018-12-02 DIAGNOSIS — I110 Hypertensive heart disease with heart failure: Secondary | ICD-10-CM | POA: Diagnosis not present

## 2018-12-02 DIAGNOSIS — I5032 Chronic diastolic (congestive) heart failure: Secondary | ICD-10-CM | POA: Diagnosis not present

## 2018-12-02 DIAGNOSIS — G894 Chronic pain syndrome: Secondary | ICD-10-CM | POA: Diagnosis not present

## 2018-12-02 DIAGNOSIS — I69351 Hemiplegia and hemiparesis following cerebral infarction affecting right dominant side: Secondary | ICD-10-CM | POA: Diagnosis not present

## 2018-12-02 DIAGNOSIS — I69328 Other speech and language deficits following cerebral infarction: Secondary | ICD-10-CM | POA: Diagnosis not present

## 2018-12-02 DIAGNOSIS — I739 Peripheral vascular disease, unspecified: Secondary | ICD-10-CM | POA: Diagnosis not present

## 2018-12-02 DIAGNOSIS — J439 Emphysema, unspecified: Secondary | ICD-10-CM | POA: Diagnosis not present

## 2018-12-02 DIAGNOSIS — I2510 Atherosclerotic heart disease of native coronary artery without angina pectoris: Secondary | ICD-10-CM | POA: Diagnosis not present

## 2018-12-02 NOTE — Telephone Encounter (Signed)
Noted. Hosp f/u scheduled.

## 2018-12-02 NOTE — Telephone Encounter (Signed)
Copied from [place] [id number]. Topic: Quick Communication - Home Health Verbal Orders >> Dec 02, 2018  1:53 PM Leward Quan A wrote: Caller/Agency: Maudie Mercury / [facility] Callback Number: [phone] ok to LM Requesting OT/PT/Skilled Nursing/Social Work/Speech Therapy: Skilled Nursing Frequency: Once a week for  4 weeks and 2 PRNs

## 2018-12-02 NOTE — Telephone Encounter (Signed)
12/02/18   Transition Care Management Follow-up Telephone Call  ADMISSION DATE: 11/26/18 DISCHARGE DATE: 11/28/18 How have you been since you were released from the hospital? Feeling better per patient.   Do you understand why you were in the hospital? Yes   Do you understand the discharge instrcutions? Yes  Items Reviewed: Medications reviewed: Yes  Allergies reviewed: NKDA   Dietary changes reviewed: Low Sodium Heart Healthy   Referrals reviewed: Appointment scheduled  Functional Questionnaire:  Activities of Daily Living (ADLs): Patient states he can perform all ADL's independently.   Any patient concerns? Not at this time.  Confirmed importance and date/time of follow-up visits scheduled: Yes  Confirmed with patient if condition begins to worsen call PCP or go to the ER. Yes    Patient was given the office number and encouragred to call back with questions or concerns. Yes

## 2018-12-02 NOTE — Telephone Encounter (Signed)
Spoke w/ Maudie Mercury- verbal orders given.

## 2018-12-02 NOTE — Telephone Encounter (Signed)
Appointment scheduled.

## 2018-12-03 ENCOUNTER — Other Ambulatory Visit: Payer: Self-pay | Admitting: Cardiovascular Disease

## 2018-12-05 ENCOUNTER — Encounter: Payer: Self-pay | Admitting: Internal Medicine

## 2018-12-05 ENCOUNTER — Ambulatory Visit (INDEPENDENT_AMBULATORY_CARE_PROVIDER_SITE_OTHER): Payer: Medicare HMO | Admitting: Internal Medicine

## 2018-12-05 ENCOUNTER — Other Ambulatory Visit: Payer: Self-pay

## 2018-12-05 DIAGNOSIS — E785 Hyperlipidemia, unspecified: Secondary | ICD-10-CM | POA: Diagnosis not present

## 2018-12-05 DIAGNOSIS — I10 Essential (primary) hypertension: Secondary | ICD-10-CM

## 2018-12-05 DIAGNOSIS — Z8673 Personal history of transient ischemic attack (TIA), and cerebral infarction without residual deficits: Secondary | ICD-10-CM | POA: Diagnosis not present

## 2018-12-05 DIAGNOSIS — F17200 Nicotine dependence, unspecified, uncomplicated: Secondary | ICD-10-CM | POA: Diagnosis not present

## 2018-12-05 DIAGNOSIS — I639 Cerebral infarction, unspecified: Secondary | ICD-10-CM

## 2018-12-05 NOTE — Progress Notes (Signed)
Subjective:    Patient ID: [name]., male    DOB: Nov 29, 1960, 58 y.o.   MRN: [id number]  DOS:  12/05/2018 Type of visit - description: Attempted  to make this a video visit, due to technical difficulties from the patient side it was not possible  thus we proceeded with a Virtual Visit via Telephone    I connected with@ on 12/05/18 at  1:20 PM EDT by telephone and verified that I am speaking with the correct person using two identifiers.  THIS ENCOUNTER IS A VIRTUAL VISIT DUE TO COVID-19 - PATIENT WAS NOT SEEN IN THE OFFICE. PATIENT HAS CONSENTED TO VIRTUAL VISIT / TELEMEDICINE VISIT   Location of patient: home  Location of provider: office  I discussed the limitations, risks, security and privacy concerns of performing an evaluation and management service by telephone and the availability of in person appointments. I also discussed with the patient that there may be a patient responsible charge related to this service. The patient expressed understanding and agreed to proceed.   History of Present Illness: Hospital follow-up Was admitted to the hospital and discharged 11/28/2018. He presented with worsening right-sided weakness, unsteady gait, slurred speech and found to have acute lacunar infarct at the distal left thalamic area and an acute right cerebellar infarct. Echo with a EF of 60 to 65% with inferobasal hypokinesis. Was  on aspirin and Plavix prior to admission. He was still smoking and was extensively counseled. Patient declined inpatient rehabilitation and was discharge home with home health services Drug panel positive for marijuana Labs reviewed:LDL 73, A1c 6.3, HIV negative, BNP 167, slightly high troponin normal, CMP okay, CBC with a hemoglobin of 17.4.    Review of Systems Since he left the hospital he is at home with his air from. Reports good compliance with medication. Still drinking "1 or 2 beers at night". Still smokes but has "cut down". He reports  that the right-sided weakness is getting back to baseline. Able to walk without a walker which was provided to him at the time of the discharge from the hospital.  Past Medical History:  Diagnosis Date  . Abnormality of gait   . Anxiety   . CAD (coronary artery disease)    a. 10/2008 Inferolateral STEMI: 3VD w/ occluded LCX (PTCA)-->CABG x 3 (LIMA->LAD, VG->OM, VG->PDA);  b. 06/2009 NSTEMI/PCI: VG->OM 100 (BMS);  c. 05/2013 native 3VD, 3/3 patent grafts. d. 06/2016: Inferior STEMI w/ occluded SVG-OM (DES placed). LIMA-LAD and SVG-PDA patent.  . Chronic bronchitis ([place])   . Chronic diastolic CHF (congestive heart failure) ([place])    a. 02/2015 Echo: EF 50-55%, distal septal, apical, inferobasal HK, mild LVH, mild MR, mildly dil LA.  [name] Cocaine use   . Colon polyps    a. 09/2015 colonscopy: multiple sessile polyps, path negative for high grade dysplasia.  . Depression   . Emphysema (subcutaneous) (surgical) resulting from a procedure 10/2008.   Bleb resected at time of 10/2008 CABG. Marketed emphysema noted on surgical report.  . Esophagitis    a. 2017 EGD: esophagitis, duodenitis.  [name] ETOH abuse   . GERD (gastroesophageal reflux disease)   . Hemianopia, homonymous, right    "can't see out the right side of either eye since stroke in 2016/pt description 09/23/2016  . Hepatic steatosis   . Hiatal hernia   . History of nuclear stress test 08/2017   Nuclear stress test 1/19: EF 35, inf-lat and ant-lat, apical sept/apical scar; no ischemia; Intermediate Risk  .  Hyperlipidemia   . Hypertension   . Iron deficiency anemia 2010  . Morbid obesity ([place])   . Myocardial infarction ([place]) "several"  . PAD (peripheral artery disease) ([place])   . Pancreatitis 06/2015  . Sleep apnea    "don't have a mask" (09/23/2016)  . Stroke ([place]) 02/2015   a. 02/2015 Carotid U/S: 1-39% bilat ICA stenosis; "left me blind" (09/23/2016)  . Tobacco abuse    still smokes  . Tubular adenoma of colon     Past Surgical  History:  Procedure Laterality Date  . ABDOMINAL AORTOGRAM W/LOWER EXTREMITY N/A 09/23/2016   Procedure: Abdominal Aortogram w/Lower Extremity;  Surgeon: Wellington Hampshire, MD;  Location: [place] [facility];  Service: Cardiovascular;  Laterality: N/A;  . CARDIAC CATHETERIZATION  11/07/2008   EF of 50-55% -- normal LV systolic function, acute inferolateral ST elevation MI,  PTCA of the circumflex artery -- [name]. [name], [name]  . CARDIAC CATHETERIZATION N/A 07/07/2016   Procedure: Left Heart Cath and Cors/Grafts Angiography;  Surgeon: [name], MD;  Location: [facility] [facility];  Service: Cardiovascular;  Laterality: N/A;  . CARDIAC CATHETERIZATION N/A 07/07/2016   Procedure: Coronary Stent Intervention;  Surgeon: [name], MD;  Location: [place] [facility];  Service: Cardiovascular;  Laterality: N/A;  . CORONARY ANGIOPLASTY    . CORONARY ARTERY BYPASS GRAFT     "CABG X4"  . LEFT HEART CATH AND CORS/GRAFTS ANGIOGRAPHY N/A 10/01/2017   Procedure: LEFT HEART CATH AND CORS/GRAFTS ANGIOGRAPHY;  Surgeon: Burnell Blanks, MD;  Location: [facility] [facility];  Service: Cardiovascular;  Laterality: N/A;  . LEFT HEART CATHETERIZATION WITH CORONARY/GRAFT ANGIOGRAM N/A 05/15/2013   Procedure: LEFT HEART CATHETERIZATION WITH Beatrix Fetters;  Surgeon: [name], MD;  Location: [facility];  Service: Cardiovascular;  Laterality: N/A;  . PERIPHERAL VASCULAR INTERVENTION  09/23/2016   Procedure: Peripheral Vascular Intervention;  Surgeon: Wellington Hampshire, MD;  Location: [place] [facility];  Service: Cardiovascular;;  Aundra Millet SFA    Social History   Socioeconomic History  . Marital status: Divorced    Spouse name: Not on file  . Number of children: 2  . Years of education: Not on file  . Highest education level: Not on file  Occupational History  . Occupation: not working , used to be a Environmental consultant  . Financial resource strain: Not on file  . Food  insecurity:    Worry: Not on file    Inability: Not on file  . Transportation needs:    Medical: Not on file    Non-medical: Not on file  Tobacco Use  . Smoking status: Current Every Day Smoker    Packs/day: 1.00    Years: 43.00    Pack years: 43.00    Types: Cigarettes  . Smokeless tobacco: Never Used  Substance and Sexual Activity  . Alcohol use: Yes    Alcohol/week: 14.0 standard drinks    Types: 14 Cans of beer per week    Comment: as off 12/2017: beer 12-pack q week  . Drug use: No    Comment: 09/23/2016 "none in years"  . Sexual activity: Not Currently  Lifestyle  . Physical activity:    Days per week: Not on file    Minutes per session: Not on file  . Stress: Not on file  Relationships  . Social connections:    Talks on phone: Not on file    Gets together: Not on file  Attends religious service: Not on file    Active member of club or organization: Not on file    Attends meetings of clubs or organizations: Not on file    Relationship status: Not on file  . Intimate partner violence:    Fear of current or ex partner: Not on file    Emotionally abused: Not on file    Physically abused: Not on file    Forced sexual activity: Not on file  Other Topics Concern  . Not on file  Social History Narrative   Lives w/ girlfriend       Allergies as of 12/05/2018   No Known Allergies     Medication List       Accurate as of December 05, 2018  1:25 PM. Always use your most recent med list.        aspirin 81 MG EC tablet Take 1 tablet (81 mg total) by mouth daily with breakfast.   atorvastatin 80 MG tablet Commonly known as:  LIPITOR Take 1 tablet (80 mg total) by mouth daily at 6 PM.   augmented betamethasone dipropionate 0.05 % cream Commonly known as:  DIPROLENE-AF Apply topically 2 (two) times daily.   clopidogrel 75 MG tablet Commonly known as:  PLAVIX Take one tablet by mouth daily. Please keep appointment in March for further refills   fenofibrate 145  MG tablet Commonly known as:  Tricor Take 1 tablet (145 mg total) by mouth daily.   folic acid 1 MG tablet Commonly known as:  FOLVITE Take 1 tablet (1 mg total) by mouth daily.   isosorbide mononitrate 60 MG 24 hr tablet Commonly known as:  IMDUR Take 1 tablet (60 mg total) by mouth daily.   lisinopril 5 MG tablet Commonly known as:  ZESTRIL Take 1 tablet (5 mg total) by mouth daily.   metoprolol tartrate 25 MG tablet Commonly known as:  LOPRESSOR Take 1 tablet (25 mg total) by mouth 2 (two) times daily.   multivitamin with minerals Tabs tablet Take 1 tablet by mouth daily.   nicotine 21 mg/24hr patch Commonly known as:  NICODERM CQ - dosed in mg/24 hours Place 1 patch (21 mg total) onto the skin daily at 6 PM.   nitroGLYCERIN 0.4 MG SL tablet Commonly known as:  Nitrostat Place 1 tablet (0.4 mg total) under the tongue every 5 (five) minutes as needed. PLACE 1 TABLET (0.4 MG TOTAL) UNDER THE TONGUE EVERY 5 MINUTES AS NEEDED FOR CHEST PAIN.   pantoprazole 40 MG tablet Commonly known as:  PROTONIX Take 1 tablet (40 mg total) by mouth daily.   thiamine 100 MG tablet Take 1 tablet (100 mg total) by mouth daily.   traZODone 50 MG tablet Commonly known as:  DESYREL Take 1 tablet (50 mg total) by mouth at bedtime.           Objective:   Physical Exam There were no vitals taken for this visit. This was a virtual phone visit, alert oriented x3, he seems to recall well his recent admission, speech is clear.    Assessment     Assessment >  Prediabetes  HTN CV: --CAD: 2010 angioplasty f/u by  CABG 2010, cath 2014 -- CP, cath 09/2017, patent grafts, medical treatment --PVD --Stroke 2008, 02-2015 Anxiety depression  OSA: Sleep study 01/2017, significant sleep apnea noted Abnormal gait GI: Iron def anemia dx 03-2015  ---cscope 09-2015 multiple polyps, repeat in one year ---EGD-2017: Esophagitis, duodenitis, bx done Pancreatitis 06/2015.(EtOH 2 days  prior to onset of  symptoms) H/o etoh abuse H/o cocaine  Smoker  +FH: father prostate ca , dx late 34s  Plan: Stroke: Recovering from a stroke, saw neurology at the hospital, was discharged in the same medications including aspirin, Plavix.  Reports that prior to admission he was doing well with med compliance.  He declined to go to rehab facility.  Patient reports that PT went to his house and they felt he was doing okay.  He reports he is walking without a walker without much problems. HTN: Good med compliance, no ambulatory BPs but he is getting a cuff and plans to start checking. High cholesterol: Well-controlled on Lipitor. Tobacco, EtOH: Strongly recommend to quit.  He is doing his best to quit tobacco using a  patch.  He is aware of tobacco-risks. Insomnia: Not taking trazodone, sleeping okay most of the time. RTC 2 months, will call and set up   I discussed the assessment and treatment plan with the patient. The patient was provided an opportunity to ask questions and all were answered. The patient agreed with the plan and demonstrated an understanding of the instructions.   The patient was advised to call back or seek an in-person evaluation if the symptoms worsen or if the condition fails to improve as anticipated.  I provided 25 minutes of non-face-to-face time during this encounter.  [name], MD

## 2018-12-05 NOTE — Assessment & Plan Note (Signed)
Stroke: Recovering from a stroke, saw neurology at the hospital, was discharged in the same medications including aspirin, Plavix.  Reports that prior to admission he was doing well with med compliance.  He declined to go to rehab facility.  Patient reports that PT went to his house and they felt he was doing okay.  He reports he is walking without a walker without much problems. HTN: Good med compliance, no ambulatory BPs but he is getting a cuff and plans to start checking. High cholesterol: Well-controlled on Lipitor. Tobacco, EtOH: Strongly recommend to quit.  He is doing his best to quit tobacco using a  patch.  He is aware of tobacco-risks. Insomnia: Not taking trazodone, sleeping okay most of the time. RTC 2 months, will call and set up

## 2018-12-08 DIAGNOSIS — D509 Iron deficiency anemia, unspecified: Secondary | ICD-10-CM | POA: Diagnosis not present

## 2018-12-08 DIAGNOSIS — J439 Emphysema, unspecified: Secondary | ICD-10-CM | POA: Diagnosis not present

## 2018-12-08 DIAGNOSIS — I5032 Chronic diastolic (congestive) heart failure: Secondary | ICD-10-CM | POA: Diagnosis not present

## 2018-12-08 DIAGNOSIS — I69328 Other speech and language deficits following cerebral infarction: Secondary | ICD-10-CM | POA: Diagnosis not present

## 2018-12-08 DIAGNOSIS — I2510 Atherosclerotic heart disease of native coronary artery without angina pectoris: Secondary | ICD-10-CM | POA: Diagnosis not present

## 2018-12-08 DIAGNOSIS — G894 Chronic pain syndrome: Secondary | ICD-10-CM | POA: Diagnosis not present

## 2018-12-08 DIAGNOSIS — I110 Hypertensive heart disease with heart failure: Secondary | ICD-10-CM | POA: Diagnosis not present

## 2018-12-08 DIAGNOSIS — I739 Peripheral vascular disease, unspecified: Secondary | ICD-10-CM | POA: Diagnosis not present

## 2018-12-08 DIAGNOSIS — I69351 Hemiplegia and hemiparesis following cerebral infarction affecting right dominant side: Secondary | ICD-10-CM | POA: Diagnosis not present

## 2018-12-14 DIAGNOSIS — D509 Iron deficiency anemia, unspecified: Secondary | ICD-10-CM | POA: Diagnosis not present

## 2018-12-14 DIAGNOSIS — I2510 Atherosclerotic heart disease of native coronary artery without angina pectoris: Secondary | ICD-10-CM | POA: Diagnosis not present

## 2018-12-14 DIAGNOSIS — I69328 Other speech and language deficits following cerebral infarction: Secondary | ICD-10-CM | POA: Diagnosis not present

## 2018-12-14 DIAGNOSIS — I739 Peripheral vascular disease, unspecified: Secondary | ICD-10-CM | POA: Diagnosis not present

## 2018-12-14 DIAGNOSIS — I69351 Hemiplegia and hemiparesis following cerebral infarction affecting right dominant side: Secondary | ICD-10-CM | POA: Diagnosis not present

## 2018-12-14 DIAGNOSIS — I110 Hypertensive heart disease with heart failure: Secondary | ICD-10-CM | POA: Diagnosis not present

## 2018-12-14 DIAGNOSIS — I5032 Chronic diastolic (congestive) heart failure: Secondary | ICD-10-CM | POA: Diagnosis not present

## 2018-12-14 DIAGNOSIS — G894 Chronic pain syndrome: Secondary | ICD-10-CM | POA: Diagnosis not present

## 2018-12-14 DIAGNOSIS — J439 Emphysema, unspecified: Secondary | ICD-10-CM | POA: Diagnosis not present

## 2018-12-19 ENCOUNTER — Telehealth: Payer: Self-pay | Admitting: Internal Medicine

## 2018-12-19 MED ORDER — CLOPIDOGREL BISULFATE 75 MG PO TABS: 75.0000 mg | ORAL_TABLET | Freq: Every day | ORAL | 1 refills | Status: DC

## 2018-12-19 NOTE — Telephone Encounter (Signed)
He is supposed to continue with Plavix, aspirin and all the other medications. Okay to RF Plavix

## 2018-12-19 NOTE — Telephone Encounter (Signed)
Please advise 

## 2018-12-19 NOTE — Telephone Encounter (Signed)
Copied from [place] [id number]. Topic: Quick Communication - Rx Refill/Question >> Dec 19, 2018  8:32 AM Alanda Slim E wrote: Medication: Maudie Mercury from [facility] called and stated that the Pt told her he was taking Paxil and doenst know why he stopped and would like to know if he can start taking it again. If so can an Rx please be sent to pharmacy / please advise   Has the patient contacted their pharmacy? No   Preferred Pharmacy (with phone number or street name): [place], [place] [phone] (Phone) [phone] (Fax)    Agent: Please be advised that RX refills may take up to 3 business days. We ask that you follow-up with your pharmacy.

## 2018-12-19 NOTE — Telephone Encounter (Signed)
Plavix refilled

## 2018-12-21 ENCOUNTER — Telehealth: Payer: Self-pay

## 2018-12-21 DIAGNOSIS — D509 Iron deficiency anemia, unspecified: Secondary | ICD-10-CM | POA: Diagnosis not present

## 2018-12-21 DIAGNOSIS — I739 Peripheral vascular disease, unspecified: Secondary | ICD-10-CM | POA: Diagnosis not present

## 2018-12-21 DIAGNOSIS — I2510 Atherosclerotic heart disease of native coronary artery without angina pectoris: Secondary | ICD-10-CM | POA: Diagnosis not present

## 2018-12-21 DIAGNOSIS — G894 Chronic pain syndrome: Secondary | ICD-10-CM | POA: Diagnosis not present

## 2018-12-21 DIAGNOSIS — I110 Hypertensive heart disease with heart failure: Secondary | ICD-10-CM | POA: Diagnosis not present

## 2018-12-21 DIAGNOSIS — J439 Emphysema, unspecified: Secondary | ICD-10-CM | POA: Diagnosis not present

## 2018-12-21 DIAGNOSIS — I69328 Other speech and language deficits following cerebral infarction: Secondary | ICD-10-CM | POA: Diagnosis not present

## 2018-12-21 DIAGNOSIS — I69351 Hemiplegia and hemiparesis following cerebral infarction affecting right dominant side: Secondary | ICD-10-CM | POA: Diagnosis not present

## 2018-12-21 DIAGNOSIS — I5032 Chronic diastolic (congestive) heart failure: Secondary | ICD-10-CM | POA: Diagnosis not present

## 2018-12-21 NOTE — Telephone Encounter (Signed)

## 2018-12-27 ENCOUNTER — Encounter: Payer: Medicare HMO | Admitting: Physician Assistant

## 2018-12-27 ENCOUNTER — Encounter: Payer: Self-pay | Admitting: Physician Assistant

## 2018-12-27 ENCOUNTER — Other Ambulatory Visit: Payer: Self-pay

## 2018-12-27 VITALS — BP 169/86 | HR 67 | Ht 71.0 in | Wt 225.0 lb

## 2018-12-27 NOTE — Progress Notes (Signed)
Date:  12/27/2018   ID:  Kenneth Romney., DOB Jul 03, 1961, MRN [id number]     PCP:  Colon Branch, MD  Cardiologist:  [name], MD  Electrophysiologist:  None   Evaluation Performed:    Chief Complaint:    History of Present Illness:    Kenneth Romney. is a 58 y.o. male with history of CAD S/P inflat STEMI 10/2008 underwent CABG x 3, NSTEMI 06/2009 BMS SVG-OM, 2014 3/3 grafts patent, 06/2016 inf SEMI DES SBG-OM, LIMA-LAD and SVG-PDA patent. Has HTN, HLD, DM, OSA, chronic diasltolic CHF, prior CVA 1157 and 2016.  Patient was admitted with acute lacunar CVA  In the distal left thalamic/acute right cerebellar infarct 11/26/18. Echo LVEF 60-65% inf/basal HK. Inpatient rehab recommended but patient refused. History of tobacco/ETOH/Cocaine. ASA was added to his Plavix.  The patient  have symptoms concerning for COVID-19 infection (fever, chills, cough, or new shortness of breath).    Past Medical History:  Diagnosis Date  . Abnormality of gait   . Anxiety   . CAD (coronary artery disease)    a. 10/2008 Inferolateral STEMI: 3VD w/ occluded LCX (PTCA)-->CABG x 3 (LIMA->LAD, VG->OM, VG->PDA);  b. 06/2009 NSTEMI/PCI: VG->OM 100 (BMS);  c. 05/2013 native 3VD, 3/3 patent grafts. d. 06/2016: Inferior STEMI w/ occluded SVG-OM (DES placed). LIMA-LAD and SVG-PDA patent.  . Chronic bronchitis ([place])   . Chronic diastolic CHF (congestive heart failure) ([place])    a. 02/2015 Echo: EF 50-55%, distal septal, apical, inferobasal HK, mild LVH, mild MR, mildly dil LA.  [name] Cocaine use   . Colon polyps    a. 09/2015 colonscopy: multiple sessile polyps, path negative for high grade dysplasia.  . Depression   . Emphysema (subcutaneous) (surgical) resulting from a procedure 10/2008.   Bleb resected at time of 10/2008 CABG. Marketed emphysema noted on surgical report.  . Esophagitis    a. 2017 EGD: esophagitis, duodenitis.  [name] ETOH abuse   . GERD (gastroesophageal reflux disease)   .  Hemianopia, homonymous, right    "can't see out the right side of either eye since stroke in 2016/pt description 09/23/2016  . Hepatic steatosis   . Hiatal hernia   . History of nuclear stress test 08/2017   Nuclear stress test 1/19: EF 35, inf-lat and ant-lat, apical sept/apical scar; no ischemia; Intermediate Risk  . Hyperlipidemia   . Hypertension   . Iron deficiency anemia 2010  . Morbid obesity ([place])   . Myocardial infarction ([place]) "several"  . PAD (peripheral artery disease) ([place])   . Pancreatitis 06/2015  . Sleep apnea    "don't have a mask" (09/23/2016)  . Stroke ([place]) 02/2015   a. 02/2015 Carotid U/S: 1-39% bilat ICA stenosis; "left me blind" (09/23/2016)  . Tobacco abuse    still smokes  . Tubular adenoma of colon    Past Surgical History:  Procedure Laterality Date  . ABDOMINAL AORTOGRAM W/LOWER EXTREMITY N/A 09/23/2016   Procedure: Abdominal Aortogram w/Lower Extremity;  Surgeon: Wellington Hampshire, MD;  Location: [place] [facility];  Service: Cardiovascular;  Laterality: N/A;  . CARDIAC CATHETERIZATION  11/07/2008   EF of 50-55% -- normal LV systolic function, acute inferolateral ST elevation MI,  PTCA of the circumflex artery -- [name]. [name], [name]  . CARDIAC CATHETERIZATION N/A 07/07/2016   Procedure: Left Heart Cath and Cors/Grafts Angiography;  Surgeon: [name], MD;  Location: [place] [facility];  Service: Cardiovascular;  Laterality: N/A;  . CARDIAC CATHETERIZATION N/A  07/07/2016   Procedure: Coronary Stent Intervention;  Surgeon: [name], MD;  Location: [facility] [place];  Service: Cardiovascular;  Laterality: N/A;  . CORONARY ANGIOPLASTY    . CORONARY ARTERY BYPASS GRAFT     "CABG X4"  . LEFT HEART CATH AND CORS/GRAFTS ANGIOGRAPHY N/A 10/01/2017   Procedure: LEFT HEART CATH AND CORS/GRAFTS ANGIOGRAPHY;  Surgeon: Burnell Blanks, MD;  Location: [place] [facility];  Service: Cardiovascular;  Laterality: N/A;  . LEFT HEART CATHETERIZATION WITH  CORONARY/GRAFT ANGIOGRAM N/A 05/15/2013   Procedure: LEFT HEART CATHETERIZATION WITH Beatrix Fetters;  Surgeon: [name], MD;  Location: [facility];  Service: Cardiovascular;  Laterality: N/A;  . PERIPHERAL VASCULAR INTERVENTION  09/23/2016   Procedure: Peripheral Vascular Intervention;  Surgeon: Wellington Hampshire, MD;  Location: [place] [facility];  Service: Cardiovascular;;  Lt. SFA     No outpatient medications have been marked as taking for the 12/27/18 encounter (Appointment) with Imogene Burn, PA-C.     Allergies:   Patient has no known allergies.   Social History   Tobacco Use  . Smoking status: Current Every Day Smoker    Packs/day: 1.00    Years: 43.00    Pack years: 43.00    Types: Cigarettes  . Smokeless tobacco: Never Used  Substance Use Topics  . Alcohol use: Yes    Alcohol/week: 14.0 standard drinks    Types: 14 Cans of beer per week    Comment: as off 12/2017: beer 12-pack q week  . Drug use: No    Comment: 09/23/2016 "none in years"     Family Hx: The patient's family history includes Colon cancer in his father; Diabetes in his mother; Heart disease in his mother; Heart failure in his mother. There is no history of Prostate cancer.  ROS:   Please see the history of present illness.     All other systems reviewed and are negative.   Prior CV studies:   The following studies were reviewed today:  2Decho 11/27/18 Transthoracic Echocardiogram With Bubble Study  IMPRESSIONS  1. The left ventricle has normal systolic function with an ejection fraction of 60-65%. The cavity size was normal. There is mildly increased left ventricular wall thickness. Left ventricular diastolic parameters were normal.  2. Inferobasal hypokinesis.  3. The right ventricle has normal systolic function. The cavity was normal. There is no increase in right ventricular wall thickness.  4. Left atrial size was moderately dilated.  5. Mild thickening of the mitral valve  leaflet. Mild calcification of the mitral valve leaflet.  6. The tricuspid valve is grossly normal.  7. The aortic valve was not well visualized. Moderate thickening of the aortic valve. Sclerosis without any evidence of stenosis of the aortic valve. No stenosis of the aortic valve.       Labs/Other Tests and Data Reviewed:    EKG:    Recent Labs: 11/26/2018: ALT 19; B Natriuretic Peptide 167.9; BUN 9; Creatinine, Ser 0.91; Hemoglobin 17.4; Platelets 165; Potassium 4.6; Sodium 135   Recent Lipid Panel Lab Results  Component Value Date/Time   CHOL 141 11/27/2018 04:41 AM   CHOL 160 08/24/2017 02:38 PM   TRIG 175 (H) 11/27/2018 04:41 AM   HDL 33 (L) 11/27/2018 04:41 AM   HDL 39 (L) 08/24/2017 02:38 PM   CHOLHDL 4.3 11/27/2018 04:41 AM   LDLCALC 73 11/27/2018 04:41 AM   LDLCALC 83 08/24/2017 02:38 PM   LDLDIRECT 116.0 05/12/2013 03:17 PM  Wt Readings from Last 3 Encounters:  11/26/18 228 lb (103.4 kg)  01/05/18 221 lb 6 oz (100.4 kg)  12/06/17 223 lb 4 oz (101.3 kg)     Objective:    Vital Signs:  There were no vitals taken for this visit.     ASSESSMENT & PLAN:    1. CAD S/P inflat STEMI 10/2008 underwent CABG x 3, NSTEMI 06/2009 BMS SVG-OM, 2014 3/3 grafts patent, 06/2016 inf SEMI DES SBG-OM, LIMA-LAD and SVG-PDA patent. 2. Acute ischemic CVA 11/26/18 while on plavix now on ASA as well-echo with normal LVEF 60-65% 3. Essential HTN 4. HLD-LDL 73- 11/27/18 5. DM 6. OSA 7. Tobacco abue 8. History of ETOH-was drinking 4 beers daily  COVID-19 Education: The signs and symptoms of COVID-19 were discussed with the patient and how to seek care for testing (follow up with PCP or arrange E-visit).  The importance of social distancing was discussed today.  Time:   Today, I have spent  minutes with the patient with telehealth technology discussing the above problems.     Medication Adjustments/Labs and Tests Ordered: Current medicines are reviewed at length with the patient  today.  Concerns regarding medicines are outlined above.   Tests Ordered: No orders of the defined types were placed in this encounter.   Medication Changes: No orders of the defined types were placed in this encounter.   Disposition:  Follow up   Signed, Ermalinda Barrios, PA-C  12/27/2018 7:34 AM    Mila Doce This encounter was created in error - please disregard.

## 2018-12-28 DIAGNOSIS — K219 Gastro-esophageal reflux disease without esophagitis: Secondary | ICD-10-CM

## 2018-12-28 DIAGNOSIS — I2510 Atherosclerotic heart disease of native coronary artery without angina pectoris: Secondary | ICD-10-CM | POA: Diagnosis not present

## 2018-12-28 DIAGNOSIS — F329 Major depressive disorder, single episode, unspecified: Secondary | ICD-10-CM

## 2018-12-28 DIAGNOSIS — I69351 Hemiplegia and hemiparesis following cerebral infarction affecting right dominant side: Secondary | ICD-10-CM | POA: Diagnosis not present

## 2018-12-28 DIAGNOSIS — I110 Hypertensive heart disease with heart failure: Secondary | ICD-10-CM | POA: Diagnosis not present

## 2018-12-28 DIAGNOSIS — Z7902 Long term (current) use of antithrombotics/antiplatelets: Secondary | ICD-10-CM

## 2018-12-28 DIAGNOSIS — G894 Chronic pain syndrome: Secondary | ICD-10-CM

## 2018-12-28 DIAGNOSIS — Z7982 Long term (current) use of aspirin: Secondary | ICD-10-CM

## 2018-12-28 DIAGNOSIS — I5032 Chronic diastolic (congestive) heart failure: Secondary | ICD-10-CM | POA: Diagnosis not present

## 2018-12-28 DIAGNOSIS — D509 Iron deficiency anemia, unspecified: Secondary | ICD-10-CM | POA: Diagnosis not present

## 2018-12-28 DIAGNOSIS — N400 Enlarged prostate without lower urinary tract symptoms: Secondary | ICD-10-CM

## 2018-12-28 DIAGNOSIS — I739 Peripheral vascular disease, unspecified: Secondary | ICD-10-CM

## 2018-12-28 DIAGNOSIS — F17210 Nicotine dependence, cigarettes, uncomplicated: Secondary | ICD-10-CM

## 2018-12-28 DIAGNOSIS — J439 Emphysema, unspecified: Secondary | ICD-10-CM | POA: Diagnosis not present

## 2018-12-28 DIAGNOSIS — I252 Old myocardial infarction: Secondary | ICD-10-CM

## 2018-12-28 DIAGNOSIS — Z9181 History of falling: Secondary | ICD-10-CM

## 2018-12-28 DIAGNOSIS — E785 Hyperlipidemia, unspecified: Secondary | ICD-10-CM

## 2018-12-28 DIAGNOSIS — F419 Anxiety disorder, unspecified: Secondary | ICD-10-CM

## 2018-12-28 DIAGNOSIS — I69328 Other speech and language deficits following cerebral infarction: Secondary | ICD-10-CM | POA: Diagnosis not present

## 2018-12-30 ENCOUNTER — Telehealth: Payer: Self-pay

## 2018-12-30 NOTE — Telephone Encounter (Signed)
Plan of care signed and faxed to [facility] at [phone]. Form sent for scanning.

## 2019-02-28 ENCOUNTER — Other Ambulatory Visit: Payer: Self-pay | Admitting: Cardiovascular Disease

## 2019-03-01 NOTE — Telephone Encounter (Signed)
OK to refill. Thanks, chris 

## 2019-04-26 ENCOUNTER — Other Ambulatory Visit: Payer: Self-pay | Admitting: Internal Medicine

## 2019-04-26 ENCOUNTER — Other Ambulatory Visit: Payer: Self-pay | Admitting: Cardiovascular Disease

## 2019-05-19 ENCOUNTER — Other Ambulatory Visit: Payer: Self-pay | Admitting: Cardiovascular Disease

## 2019-07-13 ENCOUNTER — Other Ambulatory Visit: Payer: Self-pay | Admitting: Cardiovascular Disease

## 2019-07-15 ENCOUNTER — Other Ambulatory Visit: Payer: Self-pay | Admitting: Internal Medicine

## 2019-07-17 ENCOUNTER — Other Ambulatory Visit: Payer: Self-pay

## 2019-07-17 ENCOUNTER — Other Ambulatory Visit: Payer: Self-pay | Admitting: Cardiovascular Disease

## 2019-07-17 MED ORDER — ISOSORBIDE MONONITRATE ER 60 MG PO TB24: 60.0000 mg | ORAL_TABLET | Freq: Every day | ORAL | 0 refills | Status: DC

## 2019-07-17 MED ORDER — LISINOPRIL 5 MG PO TABS: 5.0000 mg | ORAL_TABLET | Freq: Every day | ORAL | 0 refills | Status: DC

## 2019-07-17 NOTE — Telephone Encounter (Signed)
New Message   Pt c/o medication issue:  1. Name of Medication: lisinopril (ZESTRIL) 5 MG tablet  isosorbide mononitrate (IMDUR) 60 MG 24 hr tablet  2. How are you currently taking this medication (dosage and times per day)? As written  3. Are you having a reaction (difficulty breathing--STAT)?no   4. What is your medication issue?Patient is completely out of medication per [facility] and needs new prescription.

## 2019-07-19 MED ORDER — LISINOPRIL 5 MG PO TABS: 5.0000 mg | ORAL_TABLET | Freq: Every day | ORAL | 0 refills | Status: DC

## 2019-07-19 NOTE — Addendum Note (Signed)
Addended by: Derl Barrow on: 07/19/2019 12:17 PM   Modules accepted: Orders

## 2019-07-19 NOTE — Telephone Encounter (Signed)
Pt's medication was resent to pt's pharmacy as requested. Confirmation received.  °

## 2019-07-20 MED ORDER — LISINOPRIL 5 MG PO TABS: 5.0000 mg | ORAL_TABLET | Freq: Every day | ORAL | 0 refills | Status: DC

## 2019-07-20 MED ORDER — ISOSORBIDE MONONITRATE ER 60 MG PO TB24: 60.0000 mg | ORAL_TABLET | Freq: Every day | ORAL | 0 refills | Status: DC

## 2019-07-20 NOTE — Telephone Encounter (Signed)
Follow Up   *STAT* If patient is at the pharmacy, call can be transferred to refill team.   1. Which medications need to be refilled? (please list name of each medication and dose if known) clopidogrel (PLAVIX) 75 MG tablet lisinopril (ZESTRIL) 5 MG tablet  isosorbide mononitrate (IMDUR) 60 MG 24 hr tablet  2. Which pharmacy/location (including street and city if local pharmacy) is medication to be sent to? [place], [place]  3. Do they need a 30 day or 90 day supply? 90 day   Patient has appointment scheduled for 08/31/19 at 1:30pm.

## 2019-07-20 NOTE — Telephone Encounter (Signed)
Pt calling requesting a refill on clopidogrel. This medication was refilled by pt's PCP asking pt to schedule test. Would Dr. Angelena Form like to refill this medication? Please address

## 2019-07-20 NOTE — Telephone Encounter (Signed)
Called patient.  He confirmed with me he has clopidogrel 75 mg.  He has "plenty". No need to refill at this time.

## 2019-07-20 NOTE — Addendum Note (Signed)
Addended by: Derl Barrow on: 07/20/2019 05:16 PM   Modules accepted: Orders

## 2019-07-24 ENCOUNTER — Other Ambulatory Visit: Payer: Self-pay

## 2019-07-24 NOTE — Telephone Encounter (Signed)
I spoke to the patient on 07/20/19.  He said that he has "plenty" and does not request a refill on this at this time.

## 2019-07-24 NOTE — Telephone Encounter (Signed)
OK to refill. Thanks, chris 

## 2019-08-21 NOTE — Progress Notes (Deleted)
Cardiology Office Note    Date:  08/21/2019   ID:  [name], DOB [birth year], MRN [id number]  PCP:  Colon Branch, MD  Cardiologist: [name], MD EPS: None  No chief complaint on file.   History of Present Illness:  Kenneth Treharne. is a 59 y.o. male with past medical history of CAD (s/p CABG x 3 in 10/2008 following inferolateral STEMI, occluded SVG-OM by cath in 06/2009 treated with a BMS, cath in 2014 with 3/3 patent grafts), cath 09/2017 occlusion of all 3 native vessels, all 3 vein grafts were patent, LIMA to the LAD was patent SVG to OM is patent and SVG to P DA was patent.  HTN, HLD, morbid obesity, polysubstance abuse, stroke, pancreatitis, IDA, esophagitis, colon polyps, and medical noncompliance who presented to [facility] ED on 07/07/2016 as a CODE STEMI treated with DES to the SVG to the OM.  Normal LV function by LV gram.  Prior to the procedure he had V. fib and required defibrillation x1.  Had dyspnea with Brilinta and was switched to Plavix and aspirin.  He was referred to Dr. Fletcher Anon for PV workup.  Lower extremity Dopplers 09/2016 showed occluded left SFA which was treated with atherectomy, drug coated balloon angioplasty and stenting.  There was moderate disease in the right SFA.  Reviewing the chart he is canceled multiple appointments with Dr. Fletcher Anon.    2D echo 09/13/2017 LVEF 45 to 50%.   Patient was admitted 11/2018 with an acute ischemic CVA-lacunar infarcts.  2D echo LVEF 60 to 65% with inferior basal hypokinesis.  He was treated with Plavix 75 mg daily Lipitor 80 mg daily and aspirin 81 mg daily.  Smoking cessation was advised.  Declined inpatient rehab and signed out AMA   Past Medical History:  Diagnosis Date  . Abnormality of gait   . Anxiety   . CAD (coronary artery disease)    a. 10/2008 Inferolateral STEMI: 3VD w/ occluded LCX (PTCA)-->CABG x 3 (LIMA->LAD, VG->OM, VG->PDA);  b. 06/2009 NSTEMI/PCI: VG->OM 100 (BMS);  c. 05/2013 native 3VD,  3/3 patent grafts. d. 06/2016: Inferior STEMI w/ occluded SVG-OM (DES placed). LIMA-LAD and SVG-PDA patent.  . Chronic bronchitis ([place])   . Chronic diastolic CHF (congestive heart failure) ([place])    a. 02/2015 Echo: EF 50-55%, distal septal, apical, inferobasal HK, mild LVH, mild MR, mildly dil LA.  [name] Cocaine use   . Colon polyps    a. 09/2015 colonscopy: multiple sessile polyps, path negative for high grade dysplasia.  . Depression   . Emphysema (subcutaneous) (surgical) resulting from a procedure 10/2008.   Bleb resected at time of 10/2008 CABG. Marketed emphysema noted on surgical report.  . Esophagitis    a. 2017 EGD: esophagitis, duodenitis.  [name] ETOH abuse   . GERD (gastroesophageal reflux disease)   . Hemianopia, homonymous, right    "can't see out the right side of either eye since stroke in 2016/pt description 09/23/2016  . Hepatic steatosis   . Hiatal hernia   . History of nuclear stress test 08/2017   Nuclear stress test 1/19: EF 35, inf-lat and ant-lat, apical sept/apical scar; no ischemia; Intermediate Risk  . Hyperlipidemia   . Hypertension   . Iron deficiency anemia 2010  . Morbid obesity ([place])   . Myocardial infarction ([place]) "several"  . PAD (peripheral artery disease) ([place])   . Pancreatitis 06/2015  . Sleep apnea    "don't have a mask" (09/23/2016)  . Stroke [facility])  02/2015   a. 02/2015 Carotid U/S: 1-39% bilat ICA stenosis; "left me blind" (09/23/2016)  . Tobacco abuse    still smokes  . Tubular adenoma of colon     Past Surgical History:  Procedure Laterality Date  . ABDOMINAL AORTOGRAM W/LOWER EXTREMITY N/A 09/23/2016   Procedure: Abdominal Aortogram w/Lower Extremity;  Surgeon: Wellington Hampshire, MD;  Location: [place] CV LAB;  Service: Cardiovascular;  Laterality: N/A;  . CARDIAC CATHETERIZATION  11/07/2008   EF of 50-55% -- normal LV systolic function, acute inferolateral ST elevation MI,  PTCA of the circumflex artery -- [name]. [name], [name]  . CARDIAC  CATHETERIZATION N/A 07/07/2016   Procedure: Left Heart Cath and Cors/Grafts Angiography;  Surgeon: [name], MD;  Location: [place] CV LAB;  Service: Cardiovascular;  Laterality: N/A;  . CARDIAC CATHETERIZATION N/A 07/07/2016   Procedure: Coronary Stent Intervention;  Surgeon: [name], MD;  Location: [place] CV LAB;  Service: Cardiovascular;  Laterality: N/A;  . CORONARY ANGIOPLASTY    . CORONARY ARTERY BYPASS GRAFT     "CABG X4"  . LEFT HEART CATH AND CORS/GRAFTS ANGIOGRAPHY N/A 10/01/2017   Procedure: LEFT HEART CATH AND CORS/GRAFTS ANGIOGRAPHY;  Surgeon: Burnell Blanks, MD;  Location: [place] CV LAB;  Service: Cardiovascular;  Laterality: N/A;  . LEFT HEART CATHETERIZATION WITH CORONARY/GRAFT ANGIOGRAM N/A 05/15/2013   Procedure: LEFT HEART CATHETERIZATION WITH Beatrix Fetters;  Surgeon: [name], MD;  Location: [facility] CATH LAB;  Service: Cardiovascular;  Laterality: N/A;  . PERIPHERAL VASCULAR INTERVENTION  09/23/2016   Procedure: Peripheral Vascular Intervention;  Surgeon: Wellington Hampshire, MD;  Location: [place] CV LAB;  Service: Cardiovascular;;  Lt. SFA    Current Medications: No outpatient medications have been marked as taking for the 08/22/19 encounter (Appointment) with Imogene Burn, PA-C.     Allergies:   Patient has no known allergies.   Social History   Socioeconomic History  . Marital status: Divorced    Spouse name: Not on file  . Number of children: 2  . Years of education: Not on file  . Highest education level: Not on file  Occupational History  . Occupation: not working , used to be a Chief Executive Officer   . Occupation: disable  Tobacco Use  . Smoking status: Current Every Day Smoker    Packs/day: 1.00    Years: 43.00    Pack years: 43.00    Types: Cigarettes  . Smokeless tobacco: Never Used  Substance and Sexual Activity  . Alcohol use: Yes    Alcohol/week: 14.0 standard drinks    Types: 14 Cans of beer  per week    Comment: as off 12/2017: beer 12-pack q week  . Drug use: No    Comment: 09/23/2016 "none in years"  . Sexual activity: Not Currently  Other Topics Concern  . Not on file  Social History Narrative   Lives w/ girlfriend    Social Determinants of Health   Financial Resource Strain:   . Difficulty of Paying Living Expenses: Not on file  Food Insecurity:   . Worried About Charity fundraiser in the Last Year: Not on file  . Ran Out of Food in the Last Year: Not on file  Transportation Needs:   . Lack of Transportation (Medical): Not on file  . Lack of Transportation (Non-Medical): Not on file  Physical Activity:   . Days of Exercise per Week: Not on file  . Minutes of Exercise  per Session: Not on file  Stress:   . Feeling of Stress : Not on file  Social Connections:   . Frequency of Communication with Friends and Family: Not on file  . Frequency of Social Gatherings with Friends and Family: Not on file  . Attends Religious Services: Not on file  . Active Member of Clubs or Organizations: Not on file  . Attends Archivist Meetings: Not on file  . Marital Status: Not on file     Family History:  The patient's ***family history includes Colon cancer in his father; Diabetes in his mother; Heart disease in his mother; Heart failure in his mother.   ROS:   Please see the history of present illness.    ROS All other systems reviewed and are negative.   PHYSICAL EXAM:   VS:  There were no vitals taken for this visit.  Physical Exam  GEN: Well nourished, well developed, in no acute distress  HEENT: normal  Neck: no JVD, carotid bruits, or masses Cardiac:RRR; no murmurs, rubs, or gallops  Respiratory:  clear to auscultation bilaterally, normal work of breathing GI: soft, nontender, nondistended, + BS Ext: without cyanosis, clubbing, or edema, Good distal pulses bilaterally MS: no deformity or atrophy  Skin: warm and dry, no rash Neuro:  Alert and Oriented x  3, Strength and sensation are intact Psych: euthymic mood, full affect  Wt Readings from Last 3 Encounters:  12/27/18 225 lb (102.1 kg)  11/26/18 228 lb (103.4 kg)  01/05/18 221 lb 6 oz (100.4 kg)      Studies/Labs Reviewed:   EKG:  EKG is*** ordered today.  The ekg ordered today demonstrates ***  Recent Labs: 11/26/2018: ALT 19; B Natriuretic Peptide 167.9; BUN 9; Creatinine, Ser 0.91; Hemoglobin 17.4; Platelets 165; Potassium 4.6; Sodium 135   Lipid Panel    Component Value Date/Time   CHOL 141 11/27/2018 0441   CHOL 160 08/24/2017 1438   TRIG 175 (H) 11/27/2018 0441   HDL 33 (L) 11/27/2018 0441   HDL 39 (L) 08/24/2017 1438   CHOLHDL 4.3 11/27/2018 0441   VLDL 35 11/27/2018 0441   LDLCALC 73 11/27/2018 0441   LDLCALC 83 08/24/2017 1438   LDLDIRECT 116.0 05/12/2013 1517    Additional studies/ records that were reviewed today include:   2D echo 11/2018 IMPRESSIONS      1. The left ventricle has normal systolic function with an ejection fraction of 60-65%. The cavity size was normal. There is mildly increased left ventricular wall thickness. Left ventricular diastolic parameters were normal.  2. Inferobasal hypokinesis.  3. The right ventricle has normal systolic function. The cavity was normal. There is no increase in right ventricular wall thickness.  4. Left atrial size was moderately dilated.  5. Mild thickening of the mitral valve leaflet. Mild calcification of the mitral valve leaflet.  6. The tricuspid valve is grossly normal.  7. The aortic valve was not well visualized. Moderate thickening of the aortic valve. Sclerosis without any evidence of stenosis of the aortic valve. No stenosis of the aortic valve.     Cardiac cath 09/2017  Ost LM to Mid LM lesion is 20% stenosed.  Prox LAD lesion is 100% stenosed.  LIMA graft was visualized by angiography and is normal in caliber.  Prox Cx lesion is 100% stenosed.  SVG graft was visualized by angiography and is  normal in caliber.  Previously placed Origin to Prox Graft stent (unknown type) is widely patent.  Prox RCA  lesion is 100% stenosed.  SVG graft was visualized by angiography and is normal in caliber.   1. Severe triple vessel CAD with occlusion of all three native vessels. He is s/p 3v CABG and all 3 vein grafts are patent.  2. Chronic mid LAD occlusion. LIMA to LAD is patent 3. Chronic mid Circumflex occlusion. SVG to OM is patent 4. Chronic proximal RCA occlusion. SVG to PDA is patent.    Recommendations: Continue medical management of CAD. Smoking cessation.      ASSESSMENT:    No diagnosis found.   PLAN:  In order of problems listed above:   CAD status post CABG x3 in 2010 after inferior STEMI.  Please see above for details.  Last cath 09/2017 grafts were patent  Acute CVA 12/2018 refused rehab continued on Plavix 75 mg daily Lipitor 80 aspirin  Tobacco abuse  Essential hypertension  History of EtOH  Obesity  Medication Adjustments/Labs and Tests Ordered: Current medicines are reviewed at length with the patient today.  Concerns regarding medicines are outlined above.  Medication changes, Labs and Tests ordered today are listed in the Patient Instructions below. There are no Patient Instructions on file for this visit.   Sumner Boast, PA-C  08/21/2019 4:09 PM    [facility] [address], [place], [place]  [address] Phone: [phone]; Fax: [phone]

## 2019-08-22 ENCOUNTER — Ambulatory Visit: Payer: Medicare HMO | Admitting: Physician Assistant

## 2019-09-15 ENCOUNTER — Other Ambulatory Visit: Payer: Self-pay | Admitting: Cardiovascular Disease

## 2019-09-15 ENCOUNTER — Telehealth: Payer: Medicare HMO | Admitting: Physician Assistant

## 2019-09-18 ENCOUNTER — Telehealth: Payer: Medicare HMO | Admitting: Physician Assistant

## 2019-10-11 ENCOUNTER — Telehealth: Payer: Medicare HMO | Admitting: Physician Assistant

## 2019-10-30 NOTE — Progress Notes (Signed)
Virtual Visit via Telephone Note   This visit type was conducted due to national recommendations for restrictions regarding the COVID-19 Pandemic (e.g. social distancing) in an effort to limit this patient's exposure and mitigate transmission in our community.  Due to his co-morbid illnesses, this patient is at least at moderate risk for complications without adequate follow up.  This format is felt to be most appropriate for this patient at this time.  The patient did not have access to video technology/had technical difficulties with video requiring transitioning to audio format only (telephone).  All issues noted in this document were discussed and addressed.  No physical exam could be performed with this format.  Please refer to the patient's chart for his  consent to telehealth for [facility].   The patient was identified using 2 identifiers.  Date:  11/01/2019   ID:  [name], DOB 09/11/1960, MRN [id number]  Patient Location: Home Provider Location: Home  PCP:  Colon Branch, MD  Cardiologist:  [name], MD  Electrophysiologist:  None   Evaluation Performed:  Follow-Up Visit  Chief Complaint:  Follow up  History of Present Illness:    Dereon Ryel. is a 59 y.o. male with  past medical history of CAD (s/p CABG x 3 in 10/2008 following inferolateral STEMI, occluded SVG-OM by cath in 06/2009 treated with a BMS, cath in 2014 with 3/3 patent grafts, cath patent grafts 2019), HTN, HLD, morbid obesity, polysubstance abuse, stroke, pancreatitis, IDA, esophagitis, colon polyps, and medical noncompliance who presented to [facility] ED on 07/07/2016 as a CODE STEMI treated with DES to the SVG to the OM.  Normal LV function by LV gram.  Prior to the procedure he had V. fib and required defibrillation x1.  Had dyspnea with Brilinta and was switched to Plavix and aspirin.  He was referred to Dr. Fletcher Anon for PV workup.  Lower extremity Dopplers 09/2016 showed occluded  left SFA which was treated with atherectomy, drug coated balloon angioplasty and stenting.  There was moderate disease in the right SFA.  Reviewing the chart he has canceled multiple appointments with Dr. Fletcher Anon.   Patient was having daily angina so underwent cardiac cath 10/01/2017 which showed severe triple-vessel CAD with occlusion of all 3 native vessels, all 3 vein grafts were patent, medical therapy recommended.  Patient suffered an acute ischemic stroke 11/2018.  Echo EF 60 to 65% inferobasal hypokinesis.  He was treated with aspirin and Plavix as well as Lipitor and fenofibrate.  Patient refused rehab stay and signed out AMA.  Patient is still getting over his stroke. Denies chest pain. Has chronic DOE but still smoking 1 ppd. BP running high today. Usually runs [id number] systolic.  The patient does not have symptoms concerning for COVID-19 infection (fever, chills, cough, or new shortness of breath).    Past Medical History:  Diagnosis Date  . Abnormality of gait   . Anxiety   . CAD (coronary artery disease)    a. 10/2008 Inferolateral STEMI: 3VD w/ occluded LCX (PTCA)-->CABG x 3 (LIMA->LAD, VG->OM, VG->PDA);  b. 06/2009 NSTEMI/PCI: VG->OM 100 (BMS);  c. 05/2013 native 3VD, 3/3 patent grafts. d. 06/2016: Inferior STEMI w/ occluded SVG-OM (DES placed). LIMA-LAD and SVG-PDA patent.  . Chronic bronchitis ([place])   . Chronic diastolic CHF (congestive heart failure) ([place])    a. 02/2015 Echo: EF 50-55%, distal septal, apical, inferobasal HK, mild LVH, mild MR, mildly dil LA.  [name] Cocaine use   . Colon  polyps    a. 09/2015 colonscopy: multiple sessile polyps, path negative for high grade dysplasia.  . Depression   . Emphysema (subcutaneous) (surgical) resulting from a procedure 10/2008.   Bleb resected at time of 10/2008 CABG. Marketed emphysema noted on surgical report.  . Esophagitis    a. 2017 EGD: esophagitis, duodenitis.  [name] ETOH abuse   . GERD (gastroesophageal reflux disease)   . Hemianopia,  homonymous, right    "can't see out the right side of either eye since stroke in 2016/pt description 09/23/2016  . Hepatic steatosis   . Hiatal hernia   . History of nuclear stress test 08/2017   Nuclear stress test 1/19: EF 35, inf-lat and ant-lat, apical sept/apical scar; no ischemia; Intermediate Risk  . Hyperlipidemia   . Hypertension   . Iron deficiency anemia 2010  . Morbid obesity ([place])   . Myocardial infarction ([place]) "several"  . PAD (peripheral artery disease) ([place])   . Pancreatitis 06/2015  . Sleep apnea    "don't have a mask" (09/23/2016)  . Stroke ([place]) 02/2015   a. 02/2015 Carotid U/S: 1-39% bilat ICA stenosis; "left me blind" (09/23/2016)  . Tobacco abuse    still smokes  . Tubular adenoma of colon    Past Surgical History:  Procedure Laterality Date  . ABDOMINAL AORTOGRAM W/LOWER EXTREMITY N/A 09/23/2016   Procedure: Abdominal Aortogram w/Lower Extremity;  Surgeon: Wellington Hampshire, MD;  Location: [place] [facility];  Service: Cardiovascular;  Laterality: N/A;  . CARDIAC CATHETERIZATION  11/07/2008   EF of 50-55% -- normal LV systolic function, acute inferolateral ST elevation MI,  PTCA of the circumflex artery -- [name]. [name], [name]  . CARDIAC CATHETERIZATION N/A 07/07/2016   Procedure: Left Heart Cath and Cors/Grafts Angiography;  Surgeon: [name], MD;  Location: [place] [facility];  Service: Cardiovascular;  Laterality: N/A;  . CARDIAC CATHETERIZATION N/A 07/07/2016   Procedure: Coronary Stent Intervention;  Surgeon: [name], MD;  Location: [place] [facility];  Service: Cardiovascular;  Laterality: N/A;  . CORONARY ANGIOPLASTY    . CORONARY ARTERY BYPASS GRAFT     "CABG X4"  . LEFT HEART CATH AND CORS/GRAFTS ANGIOGRAPHY N/A 10/01/2017   Procedure: LEFT HEART CATH AND CORS/GRAFTS ANGIOGRAPHY;  Surgeon: Burnell Blanks, MD;  Location: [place] [facility];  Service: Cardiovascular;  Laterality: N/A;  . LEFT HEART CATHETERIZATION WITH  CORONARY/GRAFT ANGIOGRAM N/A 05/15/2013   Procedure: LEFT HEART CATHETERIZATION WITH Beatrix Fetters;  Surgeon: [name], MD;  Location[facility];  Service: Cardiovascular;  Laterality: N/A;  . PERIPHERAL VASCULAR INTERVENTION  09/23/2016   Procedure: Peripheral Vascular Intervention;  Surgeon: Wellington Hampshire, MD;  Location: [place] [facility];  Service: Cardiovascular;;  Lt. SFA     Current Meds  Medication Sig  . aspirin EC 81 MG EC tablet Take 1 tablet (81 mg total) by mouth daily with breakfast.  . atorvastatin (LIPITOR) 80 MG tablet Take 1 tablet (80 mg total) by mouth daily at 6 PM.  . augmented betamethasone dipropionate (DIPROLENE-AF) 0.05 % cream Apply topically 2 (two) times daily.  . clopidogrel (PLAVIX) 75 MG tablet Take 1 tablet (75 mg total) by mouth daily.  . fenofibrate (TRICOR) 145 MG tablet Take 1 tablet (145 mg total) by mouth daily.  . folic acid (FOLVITE) 1 MG tablet Take 1 tablet (1 mg total) by mouth daily.  . isosorbide mononitrate (IMDUR) 60 MG 24 hr tablet Take 1 tablet (60 mg total)  by mouth daily. Please keep upcoming appt in January before anymore refills. Thank you  . lisinopril (ZESTRIL) 10 MG tablet Take 1 tablet (10 mg total) by mouth daily.  . metoprolol tartrate (LOPRESSOR) 25 MG tablet Take 1 tablet (25 mg total) by mouth 2 (two) times daily. Please make overdue appt with Dr. Angelena Form before anymore refills. 3rd and Final Attempt  . Multiple Vitamin (MULTIVITAMIN WITH MINERALS) TABS tablet Take 1 tablet by mouth daily.  . nitroGLYCERIN (NITROSTAT) 0.4 MG SL tablet Place 1 tablet (0.4 mg total) under the tongue every 5 (five) minutes as needed. PLACE 1 TABLET (0.4 MG TOTAL) UNDER THE TONGUE EVERY 5 MINUTES AS NEEDED FOR CHEST PAIN.  [name] pantoprazole (PROTONIX) 40 MG tablet Take 1 tablet (40 mg total) by mouth 2 (two) times daily. Please make overdue appt with Dr. Angelena Form before anymore refills. 3rd and Final Attempt  . thiamine 100 MG tablet  Take 1 tablet (100 mg total) by mouth daily.  . traZODone (DESYREL) 50 MG tablet Take 1 tablet (50 mg total) by mouth at bedtime.  . [DISCONTINUED] lisinopril (ZESTRIL) 5 MG tablet TAKE 1 TABLET (5 MG TOTAL) BY MOUTH DAILY. PLEASE KEEP UPCOMING APPT IN JANUARY BEFORE ANYMORE REFILLS.      Allergies:   Patient has no known allergies.   Social History   Tobacco Use  . Smoking status: Current Every Day Smoker    Packs/day: 1.00    Years: 43.00    Pack years: 43.00    Types: Cigarettes  . Smokeless tobacco: Never Used  Substance Use Topics  . Alcohol use: Yes    Alcohol/week: 14.0 standard drinks    Types: 14 Cans of beer per week    Comment: as off 12/2017: beer 12-pack q week  . Drug use: No    Comment: 09/23/2016 "none in years"     Family Hx: The patient's family history includes Colon cancer in his father; Diabetes in his mother; Heart disease in his mother; Heart failure in his mother. There is no history of Prostate cancer.  ROS:   Please see the history of present illness.      All other systems reviewed and are negative.   Prior CV studies:   The following studies were reviewed today:  Echo with bubble study 4/19/2020IMPRESSIONS     1. The left ventricle has normal systolic function with an ejection  fraction of 60-65%. The cavity size was normal. There is mildly increased  left ventricular wall thickness. Left ventricular diastolic parameters  were normal.   2. Inferobasal hypokinesis.   3. The right ventricle has normal systolic function. The cavity was  normal. There is no increase in right ventricular wall thickness.   4. Left atrial size was moderately dilated.   5. Mild thickening of the mitral valve leaflet. Mild calcification of the  mitral valve leaflet.   6. The tricuspid valve is grossly normal.   7. The aortic valve was not well visualized. Moderate thickening of the  aortic valve. Sclerosis without any evidence of stenosis of the aortic  valve. No  stenosis of the aortic valve.   FINDINGS   Left Ventricle: The left ventricle has normal systolic function, with an  ejection fraction of 60-65%. The cavity size was normal. There is mildly  increased left ventricular wall thickness. Left ventricular diastolic  parameters were normal. Definity  contrast agent was given IV to delineate the left ventricular endocardial  borders. Inferobasal hypokinesis.  Right Ventricle: The right  ventricle has normal systolic function. The  cavity was normal. There is no increase in right ventricular wall  thickness.  Left Atrium: Left atrial size was moderately dilated.  Right Atrium: Right atrial size was normal in size. Right atrial pressure  is estimated at 10 mmHg.  Interatrial Septum: No atrial level shunt detected by color flow Doppler.  Pericardium: There is no evidence of pericardial effusion.  Mitral Valve: The mitral valve is normal in structure. Mild thickening of  the mitral valve leaflet. Mild calcification of the mitral valve leaflet.  Mitral valve regurgitation is mild by color flow Doppler.  Tricuspid Valve: The tricuspid valve is grossly normal. Tricuspid valve  regurgitation is mild by color flow Doppler.  Aortic Valve: The aortic valve was not well visualized Moderate thickening  of the aortic valve. Sclerosis without any evidence of stenosis of the  aortic valve. Aortic valve regurgitation was not visualized by color flow  Doppler. There is No stenosis of  the aortic valve.  Pulmonic Valve: The pulmonic valve was grossly normal. Pulmonic valve  regurgitation is mild by color flow Doppler.    LEFT VENTRICLE  PLAX 2D  LVIDd:         3.80 cm   Diastology  LVIDs:         2.80 cm   LV e' lateral:   8.92 cm/s  LV PW:         1.30 cm   LV E/e' lateral: 7.8  LV IVS:        1.30 cm   LV e' medial:    6.42 cm/s  LVOT diam:     2.00 cm   LV E/e' medial:  10.9  LV SV:         32 ml  LV SV Index:   14.06  LVOT Area:     3.14 cm   LEFT  ATRIUM              Index       RIGHT ATRIUM  LA diam:        5.00 cm  2.24 cm/m  RA Pressure: 3.00 mmHg  LA Vol (A2C):   70.1 ml  31.44 ml/m  LA Vol (A4C):   104.0 ml 46.65 ml/m  LA Biplane Vol: 89.3 ml  40.06 ml/m   AORTIC VALVE  LVOT Vmax:   122.00 cm/s  LVOT Vmean:  88.700 cm/s  LVOT VTI:    0.246 m    AORTA  Ao Root diam: 4.00 cm   MITRAL VALVE               TRICUSPID VALVE  MV Area (PHT): 2.80 cm    Estimated RAP:  3.00 mmHg  MV PHT:        78.59 msec  MV Decel Time: 271 msec    SHUNTS  MV E velocity: 69.80 cm/s  Systemic VTI:  0.25 m  MV A velocity: 74.60 cm/s  Systemic Diam: 2.00 cm  MV E/A ratio:  0.94     [name] Rouge MD  Electronically signed by [name] Rouge MD  Signature Date/Time: 11/27/2018/10:34:54 AM        Final    Cardiac cath 10/01/2017  Ost LM to Mid LM lesion is 20% stenosed.  Prox LAD lesion is 100% stenosed.  LIMA graft was visualized by angiography and is normal in caliber.  Prox Cx lesion is 100% stenosed.  SVG graft was visualized by angiography and is normal in caliber.  Previously  placed Origin to Prox Graft stent (unknown type) is widely patent.  Prox RCA lesion is 100% stenosed.  SVG graft was visualized by angiography and is normal in caliber.   1. Severe triple vessel CAD with occlusion of all three native vessels. He is s/p 3v CABG and all 3 vein grafts are patent.  2. Chronic mid LAD occlusion. LIMA to LAD is patent 3. Chronic mid Circumflex occlusion. SVG to OM is patent 4. Chronic proximal RCA occlusion. SVG to PDA is patent.    Recommendations: Continue medical management of CAD. Smoking cessation.       Labs/Other Tests and Data Reviewed:    EKG:  No ECG reviewed.  Recent Labs: 11/26/2018: ALT 19; B Natriuretic Peptide 167.9; BUN 9; Creatinine, Ser 0.91; Hemoglobin 17.4; Platelets 165; Potassium 4.6; Sodium 135   Recent Lipid Panel Lab Results  Component Value Date/Time   CHOL 141 11/27/2018 04:41 AM   CHOL  160 08/24/2017 02:38 PM   TRIG 175 (H) 11/27/2018 04:41 AM   HDL 33 (L) 11/27/2018 04:41 AM   HDL 39 (L) 08/24/2017 02:38 PM   CHOLHDL 4.3 11/27/2018 04:41 AM   LDLCALC 73 11/27/2018 04:41 AM   LDLCALC 83 08/24/2017 02:38 PM   LDLDIRECT 116.0 05/12/2013 03:17 PM    Wt Readings from Last 3 Encounters:  12/27/18 225 lb (102.1 kg)  11/26/18 228 lb (103.4 kg)  01/05/18 221 lb 6 oz (100.4 kg)     Objective:    Vital Signs:  BP (!) 150/70   Pulse 64   Ht 5\' 11"  (1.803 m)   BMI 31.38 kg/m    VITAL SIGNS:  reviewed  ASSESSMENT & PLAN:    CAD (s/p CABG x 3 in 10/2008 following inferolateral STEMI, occluded SVG-OM by cath in 06/2009 treated with a BMS, cath in 2014 with 3/3 patent grafts, cath patent grafts 2019). No recent angina. On Plavix and ASA, Imdur.  Acute stroke 11/2018-still recovering, on ASA and Plavix  Essential hypertension BP running high. Will increase lisinopril to 10 mg daily. F/u bmet in 2-3 weeks.with Dr. Angelena Form in 3-4  Hyperlipidemia LDL 73, Trig 175 11/2018-needs FLP  Polysubstance abuse-denies alcohol or marijuana  Tobacco abuse-smoking cessation discussed in detail. Nicotine patches and chantix didn't work for him.  History of noncompliance-currently taking meds and seems to be compliant.  Covid 19 vaccine education given  COVID-19 Education: The signs and symptoms of COVID-19 were discussed with the patient and how to seek care for testing (follow up with PCP or arrange E-visit).   The importance of social distancing was discussed today.  Time:   Today, I have spent 12:05 minutes with the patient with telehealth technology discussing the above problems.     Medication Adjustments/Labs and Tests Ordered: Current medicines are reviewed at length with the patient today.  Concerns regarding medicines are outlined above.   Tests Ordered: Orders Placed This Encounter  Procedures  . Basic metabolic panel  . Lipid panel    Medication Changes: Meds  ordered this encounter  Medications  . lisinopril (ZESTRIL) 10 MG tablet    Sig: Take 1 tablet (10 mg total) by mouth daily.    Dispense:  90 tablet    Refill:  3    Dose increased    Follow Up:  Either In Person or Virtual in 3 month(s) Dr. Angelena Form  Signed, Ermalinda Barrios, PA-C  11/01/2019 12:21 PM    [place]

## 2019-11-01 ENCOUNTER — Other Ambulatory Visit: Payer: Self-pay

## 2019-11-01 ENCOUNTER — Telehealth (INDEPENDENT_AMBULATORY_CARE_PROVIDER_SITE_OTHER): Payer: Medicare HMO | Admitting: Physician Assistant

## 2019-11-01 ENCOUNTER — Encounter: Payer: Self-pay | Admitting: Physician Assistant

## 2019-11-01 VITALS — BP 150/70 | HR 64 | Ht 71.0 in

## 2019-11-01 DIAGNOSIS — I25810 Atherosclerosis of coronary artery bypass graft(s) without angina pectoris: Secondary | ICD-10-CM | POA: Diagnosis not present

## 2019-11-01 DIAGNOSIS — F1910 Other psychoactive substance abuse, uncomplicated: Secondary | ICD-10-CM | POA: Diagnosis not present

## 2019-11-01 DIAGNOSIS — I639 Cerebral infarction, unspecified: Secondary | ICD-10-CM

## 2019-11-01 DIAGNOSIS — E782 Mixed hyperlipidemia: Secondary | ICD-10-CM

## 2019-11-01 DIAGNOSIS — Z7189 Other specified counseling: Secondary | ICD-10-CM | POA: Diagnosis not present

## 2019-11-01 DIAGNOSIS — Z720 Tobacco use: Secondary | ICD-10-CM

## 2019-11-01 DIAGNOSIS — Z9119 Patient's noncompliance with other medical treatment and regimen: Secondary | ICD-10-CM

## 2019-11-01 DIAGNOSIS — I10 Essential (primary) hypertension: Secondary | ICD-10-CM

## 2019-11-01 DIAGNOSIS — Z91199 Patient's noncompliance with other medical treatment and regimen due to unspecified reason: Secondary | ICD-10-CM

## 2019-11-01 MED ORDER — LISINOPRIL 10 MG PO TABS: 10.0000 mg | ORAL_TABLET | Freq: Every day | ORAL | 3 refills | Status: DC

## 2019-11-01 NOTE — Patient Instructions (Signed)
Medication Instructions:  Your physician has recommended you make the following change in your medication:   INCREASE: lisinopril to 10 mg once a day  *If you need a refill on your cardiac medications before your next appointment, please call your pharmacy*   Lab Work: Your physician recommends that you return for a FASTING lipid profile and BMET on 4/7  If you have labs (blood work) drawn today and your tests are completely normal, you will receive your results only by: [name] MyChart Message (if you have MyChart) OR . A paper copy in the mail If you have any lab test that is abnormal or we need to change your treatment, we will call you to review the results.   Testing/Procedures: None ordered   Follow-Up: Follow up with Dr. Angelena Form on 01/01/20 at 3:40 PM   Other Instructions Your physician has requested that you regularly monitor and record your blood pressure readings at home. Please use the same machine at the same time of day to check your readings and record them.   We are recommending the COVID-19 vaccine to all of our patients. Cardiac medications (including blood thinners) should not deter anyone from being vaccinated and there is no need to hold any of those medications prior to vaccine administration.   Currently, there is a hotline to call (active 08/18/19) to schedule vaccination appointments as no walk-ins will be accepted.    Vaccines through the health department can be arranged by calling [phone]    Vaccines through [facility] can be arranged by calling [phone] or visiting [email or web address]   If you have further questions or concerns about the vaccine process, please visit [email or web address], [email or web address], or contact your primary care physician.    Steps to Quit Smoking Smoking tobacco is the leading cause of preventable death. It can affect almost every organ in the body. Smoking puts you and people around you at risk  for many serious, long-lasting (chronic) diseases. Quitting smoking can be hard, but it is one of the best things that you can do for your health. It is never too late to quit. How do I get ready to quit? When you decide to quit smoking, make a plan to help you succeed. Before you quit:  Pick a date to quit. Set a date within the next 2 weeks to give you time to prepare.  Write down the reasons why you are quitting. Keep this list in places where you will see it often.  Tell your family, friends, and co-workers that you are quitting. Their support is important.  Talk with your doctor about the choices that may help you quit.  Find out if your health insurance will pay for these treatments.  Know the people, places, things, and activities that make you want to smoke (triggers). Avoid them. What first steps can I take to quit smoking?  Throw away all cigarettes at home, at work, and in your car.  Throw away the things that you use when you smoke, such as ashtrays and lighters.  Clean your car. Make sure to empty the ashtray.  Clean your home, including curtains and carpets. What can I do to help me quit smoking? Talk with your doctor about taking medicines and seeing a counselor at the same time. You are more likely to succeed when you do both.  If you are pregnant or breastfeeding, talk with your doctor about counseling or other ways to quit smoking. Do not take medicine to help you quit smoking unless your  doctor tells you to do so. To quit smoking: Quit right away  Quit smoking totally, instead of slowly cutting back on how much you smoke over a period of time.  Go to counseling. You are more likely to quit if you go to counseling sessions regularly. Take medicine You may take medicines to help you quit. Some medicines need a prescription, and some you can buy over-the-counter. Some medicines may contain a drug called nicotine to replace the nicotine in cigarettes. Medicines  may:  Help you to stop having the desire to smoke (cravings).  Help to stop the problems that come when you stop smoking (withdrawal symptoms). Your doctor may ask you to use:  Nicotine patches, gum, or lozenges.  Nicotine inhalers or sprays.  Non-nicotine medicine that is taken by mouth. Find resources Find resources and other ways to help you quit smoking and remain smoke-free after you quit. These resources are most helpful when you use them often. They include:  Online chats with a Social worker.  Phone quitlines.  Printed Furniture conservator/restorer.  Support groups or group counseling.  Text messaging programs.  Mobile phone apps. Use apps on your mobile phone or tablet that can help you stick to your quit plan. There are many free apps for mobile phones and tablets as well as websites. Examples include Quit Guide from the [employer] and smokefree.gov  What things can I do to make it easier to quit?   Talk to your family and friends. Ask them to support and encourage you.  Call a phone quitline (1-800-QUIT-NOW), reach out to support groups, or work with a Social worker.  Ask people who smoke to not smoke around you.  Avoid places that make you want to smoke, such as: ? Bars. ? Parties. ? Smoke-break areas at work.  Spend time with people who do not smoke.  Lower the stress in your life. Stress can make you want to smoke. Try these things to help your stress: ? Getting regular exercise. ? Doing deep-breathing exercises. ? Doing yoga. ? Meditating. ? Doing a body scan. To do this, close your eyes, focus on one area of your body at a time from head to toe. Notice which parts of your body are tense. Try to relax the muscles in those areas. How will I feel when I quit smoking? Day 1 to 3 weeks Within the first 24 hours, you may start to have some problems that come from quitting tobacco. These problems are very bad 2-3 days after you quit, but they do not often last for more than 2-3  weeks. You may get these symptoms:  Mood swings.  Feeling restless, nervous, angry, or annoyed.  Trouble concentrating.  Dizziness.  Strong desire for high-sugar foods and nicotine.  Weight gain.  Trouble pooping (constipation).  Feeling like you may vomit (nausea).  Coughing or a sore throat.  Changes in how the medicines that you take for other issues work in your body.  Depression.  Trouble sleeping (insomnia). Week 3 and afterward After the first 2-3 weeks of quitting, you may start to notice more positive results, such as:  Better sense of smell and taste.  Less coughing and sore throat.  Slower heart rate.  Lower blood pressure.  Clearer skin.  Better breathing.  Fewer sick days. Quitting smoking can be hard. Do not give up if you fail the first time. Some people need to try a few times before they succeed. Do your best to stick to your quit plan,  and talk with your doctor if you have any questions or concerns. Summary  Smoking tobacco is the leading cause of preventable death. Quitting smoking can be hard, but it is one of the best things that you can do for your health.  When you decide to quit smoking, make a plan to help you succeed.  Quit smoking right away, not slowly over a period of time.  When you start quitting, seek help from your doctor, family, or friends. This information is not intended to replace advice given to you by your health care provider. Make sure you discuss any questions you have with your health care provider. Document Revised: 04/21/2019 Document Reviewed: 10/15/2018 Elsevier Patient Education  [place].   Two Gram Sodium Diet 2000 mg  What is Sodium? Sodium is a mineral found naturally in many foods. The most significant source of sodium in the diet is table salt, which is about 40% sodium.  Processed, convenience, and preserved foods also contain a large amount of sodium.  The body needs only 500 mg of sodium  daily to function,  A normal diet provides more than enough sodium even if you do not use salt.  Why Limit Sodium? A build up of sodium in the body can cause thirst, increased blood pressure, shortness of breath, and water retention.  Decreasing sodium in the diet can reduce edema and risk of heart attack or stroke associated with high blood pressure.  Keep in mind that there are many other factors involved in these health problems.  Heredity, obesity, lack of exercise, cigarette smoking, stress and what you eat all play a role.  General Guidelines:  Do not add salt at the table or in cooking.  One teaspoon of salt contains over 2 grams of sodium.  Read food labels  Avoid processed and convenience foods  Ask your dietitian before eating any foods not dicussed in the menu planning guidelines  Consult your physician if you wish to use a salt substitute or a sodium containing medication such as antacids.  Limit milk and milk products to 16 oz (2 cups) per day.  Shopping Hints:  READ LABELS!! "Dietetic" does not necessarily mean low sodium.  Salt and other sodium ingredients are often added to foods during processing.   Menu Planning Guidelines Food Group Choose More Often Avoid  Beverages (see also the milk group All fruit juices, low-sodium, salt-free vegetables juices, low-sodium carbonated beverages Regular vegetable or tomato juices, commercially softened water used for drinking or cooking  Breads and Cereals Enriched white, wheat, rye and pumpernickel bread, hard rolls and dinner rolls; muffins, cornbread and waffles; most dry cereals, cooked cereal without added salt; unsalted crackers and breadsticks; low sodium or homemade bread crumbs Bread, rolls and crackers with salted tops; quick breads; instant hot cereals; pancakes; commercial bread stuffing; self-rising flower and biscuit mixes; regular bread crumbs or cracker crumbs  Desserts and Sweets Desserts and sweets mad with mild  should be within allowance Instant pudding mixes and cake mixes  Fats Butter or margarine; vegetable oils; unsalted salad dressings, regular salad dressings limited to 1 Tbs; light, sour and heavy cream Regular salad dressings containing bacon fat, bacon bits, and salt pork; snack dips made with instant soup mixes or processed cheese; salted nuts  Fruits Most fresh, frozen and canned fruits Fruits processed with salt or sodium-containing ingredient (some dried fruits are processed with sodium sulfites        Vegetables Fresh, frozen vegetables and low- sodium canned  vegetables Regular canned vegetables, sauerkraut, pickled vegetables, and others prepared in brine; frozen vegetables in sauces; vegetables seasoned with ham, bacon or salt pork  Condiments, Sauces, Miscellaneous  Salt substitute with physician's approval; pepper, herbs, spices; vinegar, lemon or lime juice; hot pepper sauce; garlic powder, onion powder, low sodium soy sauce (1 Tbs.); low sodium condiments (ketchup, chili sauce, mustard) in limited amounts (1 tsp.) fresh ground horseradish; unsalted tortilla chips, pretzels, potato chips, popcorn, salsa (1/4 cup) Any seasoning made with salt including garlic salt, celery salt, onion salt, and seasoned salt; sea salt, rock salt, kosher salt; meat tenderizers; monosodium glutamate; mustard, regular soy sauce, barbecue, sauce, chili sauce, teriyaki sauce, steak sauce, Worcestershire sauce, and most flavored vinegars; canned gravy and mixes; regular condiments; salted snack foods, olives, picles, relish, horseradish sauce, catsup   Food preparation: Try these seasonings Meats:    Pork Sage, onion Serve with applesauce  Chicken Poultry seasoning, thyme, parsley Serve with cranberry sauce  Lamb Curry powder, rosemary, garlic, thyme Serve with mint sauce or jelly  Veal Marjoram, basil Serve with current jelly, cranberry sauce  Beef Pepper, bay leaf Serve with dry mustard, unsalted chive  butter  [place] leaf, dill Serve with unsalted lemon butter, unsalted parsley butter  Vegetables:    Asparagus Lemon juice   Broccoli Lemon juice   Carrots Mustard dressing parsley, mint, nutmeg, glazed with unsalted butter and sugar   Green beans Marjoram, lemon juice, nutmeg,dill seed   Tomatoes Basil, marjoram, onion   Spice /blend for [employer]" 4 tsp ground thyme 1 tsp ground sage 3 tsp ground rosemary 4 tsp ground marjoram   Test your knowledge 2. A product that says "Salt Free" may still contain sodium. True or False 3. Garlic Powder and Hot Pepper Sauce an be used as alternative seasonings.True or False 4. Processed foods have more sodium than fresh foods.  True or False 5. Canned Vegetables have less sodium than froze True or False  WAYS TO DECREASE YOUR SODIUM INTAKE 3. Avoid the use of added salt in cooking and at the table.  Table salt (and other prepared seasonings which contain salt) is probably one of the greatest sources of sodium in the diet.  Unsalted foods can gain flavor from the sweet, sour, and butter taste sensations of herbs and spices.  Instead of using salt for seasoning, try the following seasonings with the foods listed.  Remember: how you use them to enhance natural food flavors is limited only by your creativity... Allspice-Meat, fish, eggs, fruit, peas, red and yellow vegetables Almond Extract-Fruit baked goods Anise Seed-Sweet breads, fruit, carrots, beets, cottage cheese, cookies (tastes like licorice) Basil-Meat, fish, eggs, vegetables, rice, vegetables salads, soups, sauces Bay Leaf-Meat, fish, stews, poultry Burnet-Salad, vegetables (cucumber-like flavor) Caraway Seed-Bread, cookies, cottage cheese, meat, vegetables, cheese, rice Cardamon-Baked goods, fruit, soups Celery Powder or seed-Salads, salad dressings, sauces, meatloaf, soup, bread.Do not use  celery salt Chervil-Meats, salads, fish, eggs, vegetables, cottage cheese (parsley-like  flavor) Chili Power-Meatloaf, chicken cheese, corn, eggplant, egg dishes Chives-Salads cottage cheese, egg dishes, soups, vegetables, sauces Cilantro-Salsa, casseroles Cinnamon-Baked goods, fruit, pork, lamb, chicken, carrots Cloves-Fruit, baked goods, fish, pot roast, green beans, beets, carrots Coriander-Pastry, cookies, meat, salads, cheese (lemon-orange flavor) Cumin-Meatloaf, fish,cheese, eggs, cabbage,fruit pie (caraway flavor) [employer], fruit, eggs, fish, poultry, cottage cheese, vegetables Dill Seed-Meat, cottage cheese, poultry, vegetables, fish, salads, bread Fennel Seed-Bread, cookies, apples, pork, eggs, fish, beets, cabbage, cheese, Licorice-like flavor Garlic-(buds or powder) Salads, meat, poultry, fish, bread, butter, vegetables, potatoes.Do  not  use garlic salt Ginger-Fruit, vegetables, baked goods, meat, fish, poultry Horseradish Root-Meet, vegetables, butter Lemon Juice or Extract-Vegetables, fruit, tea, baked goods, fish salads Mace-Baked goods fruit, vegetables, fish, poultry (taste like nutmeg) Maple Extract-Syrups Marjoram-Meat, chicken, fish, vegetables, breads, green salads (taste like Sage) Mint-Tea, lamb, sherbet, vegetables, desserts, carrots, cabbage Mustard, Dry or Seed-Cheese, eggs, meats, vegetables, poultry Nutmeg-Baked goods, fruit, chicken, eggs, vegetables, desserts Onion Powder-Meat, fish, poultry, vegetables, cheese, eggs, bread, rice salads (Do not use   Onion salt) Orange Extract-Desserts, baked goods Oregano-Pasta, eggs, cheese, onions, pork, lamb, fish, chicken, vegetables, green salads Paprika-Meat, fish, poultry, eggs, cheese, vegetables Parsley Flakes-Butter, vegetables, meat fish, poultry, eggs, bread, salads (certain forms may   Contain sodium Pepper-Meat fish, poultry, vegetables, eggs Peppermint Extract-Desserts, baked goods Poppy Seed-Eggs, bread, cheese, fruit dressings, baked goods, noodles, vegetables, cottage  [employer], poultry, meat, fish, cauliflower, turnips,eggs bread Saffron-Rice, bread, veal, chicken, fish, eggs Sage-Meat, fish, poultry, onions, eggplant, tomateos, pork, stews Savory-Eggs, salads, poultry, meat, rice, vegetables, soups, pork Tarragon-Meat, poultry, fish, eggs, butter, vegetables (licorice-like flavor)  Thyme-Meat, poultry, fish, eggs, vegetables, (clover-like flavor), sauces, soups Tumeric-Salads, butter, eggs, fish, rice, vegetables (saffron-like flavor) Vanilla Extract-Baked goods, candy Vinegar-Salads, vegetables, meat marinades Walnut Extract-baked goods, candy  2. Choose your Foods Wisely   The following is a list of foods to avoid which are high in sodium:  Meats-Avoid all smoked, canned, salt cured, dried and kosher meat and fish as well as Anchovies   [employer] meats:Bologna, Liverwurst, Pastrami Canned meat or fish  Marinated herring Caviar    Pepperoni Corned Beef   Pizza Dried chipped beef  Salami Frozen breaded fish or meat Salt pork Frankfurters or hot dogs  Sardines Gefilte fish   Sausage Ham (boiled ham, Proscuitto Smoked butt    spiced ham)   Spam      TV Dinners Vegetables Canned vegetables (Regular) Relish Canned mushrooms  Sauerkraut Olives    Tomato juice Pickles  Bakery and Dessert Products Canned puddings  Cream pies Cheesecake   Decorated cakes Cookies  Beverages/Juices Tomato juice, regular  Gatorade   V-8 vegetable juice, regular  Breads and Cereals Biscuit mixes   Salted potato chips, corn chips, pretzels Bread stuffing mixes  Salted crackers and rolls Pancake and waffle mixes Self-rising flour  Seasonings Accent    Meat sauces Barbecue sauce  Meat tenderizer Catsup    Monosodium glutamate (MSG) Celery salt   Onion salt Chili sauce   Prepared mustard Garlic salt   Salt, seasoned salt, sea salt Gravy mixes   Soy sauce Horseradish   Steak sauce Ketchup   Tartar sauce Lite  salt    Teriyaki sauce Marinade mixes   Worcestershire sauce  Others Baking powder   Cocoa and cocoa mixes Baking soda   Commercial casserole mixes Candy-caramels, chocolate  Dehydrated soups    Bars, fudge,nougats  Instant rice and pasta mixes Canned broth or soup  Maraschino cherries Cheese, aged and processed cheese and cheese spreads  Learning Assessment Quiz  Indicated T (for True) or F (for False) for each of the following statements:  4. _____ Fresh fruits and vegetables and unprocessed grains are generally low in sodium 5. _____ Water may contain a considerable amount of sodium, depending on the source 6. _____ You can always tell if a food is high in sodium by tasting it 7. _____ Certain laxatives my be high in sodium and should be avoided unless prescribed   by  a physician or pharmacist 8. _____ Salt substitutes may be used freely by anyone on a sodium restricted diet 9. _____ Sodium is present in table salt, food additives and as a natural component of   most foods 10. _____ Table salt is approximately 90% sodium 11. _____ Limiting sodium intake may help prevent excess fluid accumulation in the body 12. _____ On a sodium-restricted diet, seasonings such as bouillon soy sauce, and    cooking wine should be used in place of table salt 13. _____ On an ingredient list, a product which lists monosodium glutamate as the first   ingredient is an appropriate food to include on a low sodium diet  Circle the best answer(s) to the following statements (Hint: there may be more than one correct answer)  11. On a low-sodium diet, some acceptable snack items are:    A. Olives  F. Bean dip   K. Grapefruit juice    B. Salted Pretzels G. Commercial Popcorn   L. Canned peaches    C. Carrot Sticks  H. Bouillon   M. Unsalted nuts   D. [place] fries  I. Peanut butter crackers N. Salami   E. Sweet pickles J. Tomato Juice   O. Pizza  12.  Seasonings that may be used freely on a reduced -  sodium diet include   A. Lemon wedges F.Monosodium glutamate K. Celery seed    B.Soysauce   G. Pepper   L. Mustard powder   C. Sea salt  H. Cooking wine  M. Onion flakes   D. Vinegar  E. Prepared horseradish N. Salsa   E. Sage   J. Worcestershire sauce  O. Chutney

## 2019-11-08 ENCOUNTER — Telehealth: Payer: Self-pay | Admitting: Internal Medicine

## 2019-11-08 ENCOUNTER — Other Ambulatory Visit: Payer: Self-pay

## 2019-11-08 NOTE — Chronic Care Management (AMB) (Signed)
  Chronic Care Management   Note  11/08/2019 Name: Aaro Guardado. MRN: [id number] DOB: 1961-04-10  [name] is a 59 y.o. year old male who is a primary care patient of Colon Branch, MD. I reached out to [name] by phone today in response to a referral sent by Mr. HOOVER VANECK Jr.'s PCP, Colon Branch, MD.   Mr. Showman was given information about Chronic Care Management services today including:  1. CCM service includes personalized support from designated clinical staff supervised by his physician, including individualized plan of care and coordination with other care providers 2. 24/7 contact phone numbers for assistance for urgent and routine care needs. 3. Service will only be billed when office clinical staff spend 20 minutes or more in a month to coordinate care. 4. Only one practitioner may furnish and bill the service in a calendar month. 5. The patient may stop CCM services at any time (effective at the end of the month) by phone call to the office staff.   Patient agreed to services and verbal consent obtained.   Follow up plan:   Raynicia Dukes [employer] Scheduler

## 2019-11-14 ENCOUNTER — Other Ambulatory Visit: Payer: Medicare HMO

## 2019-11-14 ENCOUNTER — Other Ambulatory Visit: Payer: Self-pay

## 2019-11-14 ENCOUNTER — Ambulatory Visit: Payer: Medicare HMO | Admitting: Internal Medicine

## 2019-11-14 DIAGNOSIS — Z9119 Patient's noncompliance with other medical treatment and regimen: Secondary | ICD-10-CM

## 2019-11-14 DIAGNOSIS — Z720 Tobacco use: Secondary | ICD-10-CM | POA: Diagnosis not present

## 2019-11-14 DIAGNOSIS — Z7189 Other specified counseling: Secondary | ICD-10-CM | POA: Diagnosis not present

## 2019-11-14 DIAGNOSIS — I10 Essential (primary) hypertension: Secondary | ICD-10-CM

## 2019-11-14 DIAGNOSIS — F1910 Other psychoactive substance abuse, uncomplicated: Secondary | ICD-10-CM

## 2019-11-14 DIAGNOSIS — I25810 Atherosclerosis of coronary artery bypass graft(s) without angina pectoris: Secondary | ICD-10-CM

## 2019-11-14 DIAGNOSIS — E782 Mixed hyperlipidemia: Secondary | ICD-10-CM

## 2019-11-14 DIAGNOSIS — Z91199 Patient's noncompliance with other medical treatment and regimen due to unspecified reason: Secondary | ICD-10-CM

## 2019-11-14 DIAGNOSIS — I639 Cerebral infarction, unspecified: Secondary | ICD-10-CM

## 2019-11-15 ENCOUNTER — Other Ambulatory Visit: Payer: Medicare HMO

## 2019-11-15 LAB — BASIC METABOLIC PANEL
BUN/Creatinine Ratio: 13 (ref 9–20)
BUN: 12 mg/dL (ref 6–24)
CO2: 21 mmol/L (ref 20–29)
Calcium: 9.1 mg/dL (ref 8.7–10.2)
Chloride: 97 mmol/L (ref 96–106)
Creatinine, Ser: 0.9 mg/dL (ref 0.76–1.27)
GFR calc Af Amer: 109 mL/min/{1.73_m2} (ref >59)
GFR calc non Af Amer: 94 mL/min/{1.73_m2} (ref >59)
Glucose: 101 mg/dL — ABNORMAL HIGH (ref 65–99)
Potassium: 4.9 mmol/L (ref 3.5–5.2)
Sodium: 133 mmol/L — ABNORMAL LOW (ref 134–144)

## 2019-11-15 LAB — LIPID PANEL
Chol/HDL Ratio: 4.6 ratio (ref 0.0–5.0)
Cholesterol, Total: 171 mg/dL (ref 100–199)
HDL: 37 mg/dL — ABNORMAL LOW (ref >39)
LDL Chol Calc (NIH): 116 mg/dL — ABNORMAL HIGH (ref 0–99)
Triglycerides: 97 mg/dL (ref 0–149)
VLDL Cholesterol Cal: 18 mg/dL (ref 5–40)

## 2019-11-17 ENCOUNTER — Ambulatory Visit: Payer: Medicare HMO | Attending: Internal Medicine

## 2019-11-17 DIAGNOSIS — Z23 Encounter for immunization: Secondary | ICD-10-CM

## 2019-11-17 NOTE — Progress Notes (Signed)
   [id number]  Name:  [name].    MRN: [id number] DOB: [birth year]  11/17/2019  [name] was observed post Covid-19 immunization for 15 minutes without incident. He was provided with Vaccine Information Sheet and instruction to access the V-Safe system.   [name] was instructed to call 911 with any severe reactions post vaccine: [name] Difficulty breathing  . Swelling of face and throat  . A fast heartbeat  . A bad rash all over body  . Dizziness and weakness   Immunizations Administered    Name Date Dose VIS Date Route   Pfizer COVID-19 Vaccine 11/17/2019  3:40 PM 0.3 mL 07/21/2019 Intramuscular   Manufacturer: [employer], [employer]   Lot: [id number]   Golden: [id number]

## 2019-11-27 ENCOUNTER — Telehealth: Payer: Self-pay | Admitting: Cardiovascular Disease

## 2019-11-27 ENCOUNTER — Ambulatory Visit (INDEPENDENT_AMBULATORY_CARE_PROVIDER_SITE_OTHER): Payer: Medicare HMO | Admitting: Pharmacist

## 2019-11-27 ENCOUNTER — Other Ambulatory Visit: Payer: Self-pay

## 2019-11-27 DIAGNOSIS — F17200 Nicotine dependence, unspecified, uncomplicated: Secondary | ICD-10-CM | POA: Diagnosis not present

## 2019-11-27 DIAGNOSIS — E785 Hyperlipidemia, unspecified: Secondary | ICD-10-CM

## 2019-11-27 DIAGNOSIS — I739 Peripheral vascular disease, unspecified: Secondary | ICD-10-CM

## 2019-11-27 MED ORDER — REPATHA SURECLICK 140 MG/ML ~~LOC~~ SOAJ: 1.0000 "pen " | SUBCUTANEOUS | 11 refills | Status: DC

## 2019-11-27 NOTE — Telephone Encounter (Signed)
Yes schedule the tests you mentioned urgently and follow-up with me.

## 2019-11-27 NOTE — Progress Notes (Signed)
Patient ID: [name].                 DOB: 1960-12-30                    MRN: [id number]     HPI: [name]. is a 59 y.o. male patient of Dr Angelena Form referred to lipid clinic by Ermalinda Barrios, PA. PMH is significant for CAD s/p CABG x3 in 10/2008 after STEMI, occluded SVG-OM by cath in 06/2009 treated with BMS, subsequent STEMI 06/2016 treated with DES to SVG-OM, HTN, HLD, PVD with occluded left SFA treated with atherectomy, balloon angioplasty and stenting, obesity, polysubstance abuse, stroke, pancreatitis, IDA, esophagitis, tobacco abuse, and medical noncompliance. He underwent cath 09/2017 due to daily angina, cath revealed severe triple vessel CAD with occlusion of all 3 native vessels, all 3 vein grafts were patent and medical therapy was recommended. He suffered an ischemic stroke 11/2018. Most recent lipid panel revealed LDL of 116 on high intensity statin. He was referred to lipid clinic for further management.  Pt presents today in good spirits. Main complaints are fatigue and leg pain. States he has not fully recovered from his second stroke. Exercise is minimal, gets SOB with short walks. Still smoking 1.5 PPD. Rolls his own cigarettes. States Chantix did not work but did pick up 57m OTC nicotine patches to try, has not started using them yet. He is interested in quitting. Tolerates his lipid lowering therapy well.  Current Medications: atorvastatin 81mdaily, fenofibrate 14560maily  Risk Factors: CAD s/p multiple MIs and DES, PVD, stroke, tobacco abuse, HTN, obesity  LDL goal: <52m25m  Diet:  Breakfast - biscuit Skips lunch Dinner - steak (2x/week), mac and cheese, pizza, likes fried fish and chicken Doesn't snack much Drinks - chocolate milk or water, limits soda Daughter is a dietician  Exercise: No energy after his second stroke, minimal exercise.   Family History: The patient's family history includes Colon cancer in his father; Diabetes in his mother;  Heart disease in his mother; Heart failure in his mother. There is no history of Prostate cancer.  Social History: Smokes 1 PPD for 43 years. States nicotine patches and Chantix didn't work for him. Drinks a 12 pack of beer each week.  Labs: 11/14/19: TC 171, TG 97, HDL 37, LDL 116 (atorvastatin 80mg35mly, fenofibrate 145mg 57my)  Past Medical History:  Diagnosis Date  . Abnormality of gait   . Anxiety   . CAD (coronary artery disease)    a. 10/2008 Inferolateral STEMI: 3VD w/ occluded LCX (PTCA)-->CABG x 3 (LIMA->LAD, VG->OM, VG->PDA);  b. 06/2009 NSTEMI/PCI: VG->OM 100 (BMS);  c. 05/2013 native 3VD, 3/3 patent grafts. d. 06/2016: Inferior STEMI w/ occluded SVG-OM (DES placed). LIMA-LAD and SVG-PDA patent.  . Chronic bronch[place] (HCC)  [place] (congestive heart failure[place] (HCC[place]  [place]. 02/2015 Echo: EF 50-55%, distal septal, apical, inferobasal HK, mild LVH, mild MR, mildly dil LA.  . CocaMarland Kitchenne use   . Colon polyps    a. 09/2015 colonscopy: multiple sessile polyps, path negative for high grade dysplasia.  . Depression   . Emphysema (subcutaneous) (surgical) resulting from a procedure 10/2008.   Bleb resected at time of 10/2008 CABG. Marketed emphysema noted on surgical report.  . Esophagitis    a. 2017 EGD: esophagitis, duodenitis.  . ETOHMarland Kitchenabuse   . GERD (gastroesophageal reflux disease)   . Hemianopia, homonymous, right    "can't see out  the right side of either eye since stroke in 2016/pt description 09/23/2016  . Hepatic steatosis   . Hiatal hernia   . History of nuclear stress test 08/2017   Nuclear stress test 1/19: EF 35, inf-lat and ant-lat, apical sept/apical scar; no ischemia; Intermediate Risk  . Hyperlipidemia   . Hypertension   . Iron deficiency anemia 2010  . Morbid obesity ([place])   . Myocardial infarction ([place]) "several"  . PAD (peripheral artery disease) ([place])   . Pancreatitis 06/2015  . Sleep apnea    "don't have a mask" (09/23/2016)  . Stroke ([place]) 02/2015    a. 02/2015 Carotid U/S: 1-39% bilat ICA stenosis; "left me blind" (09/23/2016)  . Tobacco abuse    still smokes  . Tubular adenoma of colon     Current Outpatient Medications on File Prior to Visit  Medication Sig Dispense Refill  . aspirin EC 81 MG EC tablet Take 1 tablet (81 mg total) by mouth daily with breakfast. 30 tablet 2  . atorvastatin (LIPITOR) 80 MG tablet Take 1 tablet (80 mg total) by mouth daily at 6 PM. 90 tablet 3  . augmented betamethasone dipropionate (DIPROLENE-AF) 0.05 % cream Apply topically 2 (two) times daily. 60 g 0  . clopidogrel (PLAVIX) 75 MG tablet Take 1 tablet (75 mg total) by mouth daily. 90 tablet 0  . fenofibrate (TRICOR) 145 MG tablet Take 1 tablet (145 mg total) by mouth daily. 90 tablet 3  . folic acid (FOLVITE) 1 MG tablet Take 1 tablet (1 mg total) by mouth daily. 90 tablet 3  . isosorbide mononitrate (IMDUR) 60 MG 24 hr tablet Take 1 tablet (60 mg total) by mouth daily. Please keep upcoming appt in January before anymore refills. Thank you 90 tablet 0  . lisinopril (ZESTRIL) 10 MG tablet Take 1 tablet (10 mg total) by mouth daily. 90 tablet 3  . metoprolol tartrate (LOPRESSOR) 25 MG tablet Take 1 tablet (25 mg total) by mouth 2 (two) times daily. Please make overdue appt with Dr. Angelena Form before anymore refills. 3rd and Final Attempt 30 tablet 0  . Multiple Vitamin (MULTIVITAMIN WITH MINERALS) TABS tablet Take 1 tablet by mouth daily. 90 tablet 2  . nitroGLYCERIN (NITROSTAT) 0.4 MG SL tablet Place 1 tablet (0.4 mg total) under the tongue every 5 (five) minutes as needed. PLACE 1 TABLET (0.4 MG TOTAL) UNDER THE TONGUE EVERY 5 MINUTES AS NEEDED FOR CHEST PAIN. 30 tablet 2  . pantoprazole (PROTONIX) 40 MG tablet Take 1 tablet (40 mg total) by mouth 2 (two) times daily. Please make overdue appt with Dr. Angelena Form before anymore refills. 3rd and Final Attempt 30 tablet 0  . thiamine 100 MG tablet Take 1 tablet (100 mg total) by mouth daily. 90 tablet 3  .  traZODone (DESYREL) 50 MG tablet Take 1 tablet (50 mg total) by mouth at bedtime. 90 tablet 3   No current facility-administered medications on file prior to visit.    No Known Allergies  Assessment/Plan:  1. Hyperlipidemia - LDL 116 above goal < 63m/dL due to extensive and recurrent ASCVD. Discussed expected benefits and injection technique of PCSK9i therapy. Prior auth for Repatha 142mQ2W has been submitted and approved through 05/25/20. Will continue atorvastatin 8036maily and fenofibrate 145m27mily. Encouraged pt to limit intake of saturated fats, red meat, and fried food. Will check lipid panel and LFTs in 2 months.  2. Tobacco abuse - Pt currently smoking 1.5 PPD but is interested in quitting. Encouraged him  to look for nicotine patch 54m and will assess progress at next office visit.  Pt overdue to see physician. Has not seen Dr AFletcher Anonor Dr MAngelena Formsince 2018 (no showed for multiple visits). Will schedule follow up with Dr AFletcher Anonas pt endorses leg pain from his PVD. F/u in pharmacy clinic as needed.   Moriah Shawley E. Tawan Corkern, PharmD, BCACP, CBon [id number] [place] [address]Phone: [phone] Fax: [phone]/19/2021 3:02 PM

## 2019-11-27 NOTE — Telephone Encounter (Signed)
Orders have been placed and message sent to scheduling.  

## 2019-11-27 NOTE — Telephone Encounter (Signed)
New message    Patient saw the pharmacist today and came to check out to schedule an overdue appointment with Dr Fletcher Anon.  He states that his right leg is numb from time to time and needs to be "fixed" like the left leg.  Patient wanted to have a LE arterial ultrasound first then see Dr Fletcher Anon. There is no order in epic for ultrasound.  Patient would like to talk to the nurse to see if Dr Fletcher Anon will order a LE arterial ultrasound and see Dr Fletcher Anon at a later date.  He also wanted to know if Dr Fletcher Anon will do a virtual visit instead of a in person visit.  Please call.

## 2019-11-27 NOTE — Telephone Encounter (Signed)
Returned the call to the patient. The patient has not seen Dr. Fletcher Anon in 3 years. He had a pv angiogram in 2018 on the left side. He stated that his right side now feels the way his left side did. He stated that he can barely walk due to the pain but that the pain does subside once he is sitting. He denies a temperature change and any discoloration. He did state that there is some mild swelling.  He would like to know if an LEA can be ordered before he is seen by Dr. Raeanne Barry and wanted to know if he could do a virtual visit. He has been advised that since it has been so long and due to his symptoms that he would need to be seen in the office.

## 2019-11-27 NOTE — Patient Instructions (Addendum)
It was nice to meet you today!  Your LDL is currently 116 and your goal is < 55  Start Repatha injections. Inject 1 pen into the fatty tissue of your abdomen every 14 days. Store your medication in the fridge until you are ready to use it. You can let the medication warm to room temperature for 30-60 minutes prior if you would like.  Continue taking atorvastatin 80mg  daily and fenofibrate 145mg  daily  Look for nicotine patches - 21mg  dose and wear 1 patch each day  Try your best to cut back with smoking. Quitting is one of the most important things you can do for your health  Schedule follow up with Dr Fletcher Anon - overdue to be seen

## 2019-11-28 ENCOUNTER — Telehealth: Payer: Self-pay | Admitting: Internal Medicine

## 2019-11-28 NOTE — Telephone Encounter (Signed)
Left a message for the patient to call back to see if he can come in on Thursday at 10 am for the tests.

## 2019-11-28 NOTE — Progress Notes (Signed)
Unable to contact patient to reschedule his CCM appt with CPP. CPP will be out of office for an important meeting.   Raynicia Dukes [employer] Scheduler

## 2019-11-30 ENCOUNTER — Telehealth: Payer: Medicare HMO

## 2019-12-01 NOTE — Telephone Encounter (Signed)
Have been unable to reach the patient. Message has been sent to scheduling as well.

## 2019-12-05 NOTE — Telephone Encounter (Signed)
Multiple attempts have been made to reach the patient to schedule his dopplers. Have been unable get in touch with the patient.

## 2019-12-11 ENCOUNTER — Ambulatory Visit: Payer: Medicare HMO | Attending: Internal Medicine

## 2019-12-11 DIAGNOSIS — Z23 Encounter for immunization: Secondary | ICD-10-CM

## 2019-12-11 NOTE — Progress Notes (Signed)
   [id number] Vaccination [facility]  Name:  Codye Nebergall.    MRN: [id number] DOB: [birth year]  12/11/2019  Mr. Sussman was observed post Covid-19 immunization for 15 minutes without incident. He was provided with Vaccine Information Sheet and instruction to access the V-Safe system.   Mr. Vonstein was instructed to call 911 with any severe reactions post vaccine: [name] Difficulty breathing  . Swelling of face and throat  . A fast heartbeat  . A bad rash all over body  . Dizziness and weakness   Immunizations Administered    Name Date Dose VIS Date Route   Pfizer COVID-19 Vaccine 12/11/2019  3:36 PM 0.3 mL 10/04/2018 Intramuscular   Manufacturer: [place]   Lot: [id number]   [place]: [id number]

## 2019-12-12 ENCOUNTER — Telehealth: Payer: Self-pay

## 2019-12-12 NOTE — Telephone Encounter (Signed)
I spoke with patient. He was returning call to set up appointments regarding pain in his legs

## 2019-12-12 NOTE — Telephone Encounter (Signed)
New message    Patient was transferred to my voicemail by mistake, patient needs to have call back.

## 2019-12-15 ENCOUNTER — Other Ambulatory Visit: Payer: Self-pay | Admitting: Cardiovascular Disease

## 2019-12-20 ENCOUNTER — Other Ambulatory Visit: Payer: Self-pay | Admitting: Cardiovascular Disease

## 2019-12-20 ENCOUNTER — Other Ambulatory Visit: Payer: Self-pay

## 2019-12-20 ENCOUNTER — Ambulatory Visit (HOSPITAL_COMMUNITY)
Admission: RE | Admit: 2019-12-20 | Discharge: 2019-12-20 | Disposition: A | Discharge status: Home / Self Care | Payer: Medicare HMO | Source: Ambulatory Visit | Attending: Cardiovascular Disease | Admitting: Cardiovascular Disease

## 2019-12-20 DIAGNOSIS — I739 Peripheral vascular disease, unspecified: Secondary | ICD-10-CM | POA: Diagnosis not present

## 2019-12-26 ENCOUNTER — Other Ambulatory Visit: Payer: Self-pay

## 2019-12-26 ENCOUNTER — Encounter: Payer: Self-pay | Admitting: Cardiovascular Disease

## 2019-12-26 ENCOUNTER — Ambulatory Visit (INDEPENDENT_AMBULATORY_CARE_PROVIDER_SITE_OTHER): Payer: Medicare HMO | Admitting: Cardiovascular Disease

## 2019-12-26 VITALS — BP 150/70 | HR 62 | Temp 97.0°F | Ht 71.0 in | Wt 235.4 lb

## 2019-12-26 DIAGNOSIS — E785 Hyperlipidemia, unspecified: Secondary | ICD-10-CM | POA: Diagnosis not present

## 2019-12-26 DIAGNOSIS — I25810 Atherosclerosis of coronary artery bypass graft(s) without angina pectoris: Secondary | ICD-10-CM

## 2019-12-26 DIAGNOSIS — I739 Peripheral vascular disease, unspecified: Secondary | ICD-10-CM

## 2019-12-26 DIAGNOSIS — Z720 Tobacco use: Secondary | ICD-10-CM | POA: Diagnosis not present

## 2019-12-26 NOTE — Progress Notes (Signed)
Cardiology Office Note   Date:  12/26/2019   ID:  Kenneth Romney., DOB July 05, 1961, MRN [id number]  PCP:  Colon Branch, MD  Cardiologist:  Dr. Angelena Form  No chief complaint on file.     History of Present Illness: Kenneth Fama. is a 59 y.o. male who is here today for reevaluation of peripheral arterial disease and claudication.  He has not been seen by me since March 2018.  He has known history of coronary artery disease status post CABG in 2010 and PCI on SVG to OM later that year, hypertension, hyperlipidemia, chronic diastolic heart failure, polysubstance abuse and multiple other comorbidities. He was hospitalized in November, 2017 for ST elevation myocardial infarction complicated by ventricular fibrillation. Cardiac cath showed occluded SVG to OM which was treated successfully with PCI and drug-eluting stent placement.  He was seen in 2018 for bilateral leg claudication.  Noninvasive vascular evaluation showed an ABI of 0.82 on the right and 0.66 on the left.  Angiography was done in February 2018 which showed no significant aortoiliac disease other than occluded internal iliac arteries, occluded left SFA with three-vessel runoff below the knee and significant disease affecting the right proximal SFA. I performed successful atherectomy of the left SFA followed by drug-coated balloon angioplasty and stenting of the midsegment.  He did have repeat cardiac catheterization in February 2019 which showed occluded native vessels with patent grafts.  He had recent noninvasive vascular evaluation which showed an ABI of 0.94 on the right and 0.91 on the left.  Duplex showed moderate bilateral SFA disease with patent left SFA stent without significant restenosis.   He reports bilateral leg pain that starts in the lower back and goes to his hips all the way down to the legs especially on the right side.  He reports having another stroke in 2019 which resulted in right-sided weakness  with some numbness.  Is limited by shortness of breath with continued tobacco use.  No chest pain.  Past Medical History:  Diagnosis Date  . Abnormality of gait   . Anxiety   . CAD (coronary artery disease)    a. 10/2008 Inferolateral STEMI: 3VD w/ occluded LCX (PTCA)-->CABG x 3 (LIMA->LAD, VG->OM, VG->PDA);  b. 06/2009 NSTEMI/PCI: VG->OM 100 (BMS);  c. 05/2013 native 3VD, 3/3 patent grafts. d. 06/2016: Inferior STEMI w/ occluded SVG-OM (DES placed). LIMA-LAD and SVG-PDA patent.  . Chronic bronchitis ([place])   . Chronic diastolic CHF (congestive heart failure) ([place])    a. 02/2015 Echo: EF 50-55%, distal septal, apical, inferobasal HK, mild LVH, mild MR, mildly dil LA.  [name] Cocaine use   . Colon polyps    a. 09/2015 colonscopy: multiple sessile polyps, path negative for high grade dysplasia.  . Depression   . Emphysema (subcutaneous) (surgical) resulting from a procedure 10/2008.   Bleb resected at time of 10/2008 CABG. Marketed emphysema noted on surgical report.  . Esophagitis    a. 2017 EGD: esophagitis, duodenitis.  [name] ETOH abuse   . GERD (gastroesophageal reflux disease)   . Hemianopia, homonymous, right    "can't see out the right side of either eye since stroke in 2016/pt description 09/23/2016  . Hepatic steatosis   . Hiatal hernia   . History of nuclear stress test 08/2017   Nuclear stress test 1/19: EF 35, inf-lat and ant-lat, apical sept/apical scar; no ischemia; Intermediate Risk  . Hyperlipidemia   . Hypertension   . Iron deficiency anemia 2010  . Morbid obesity (  [place])   . Myocardial infarction ([place]) "several"  . PAD (peripheral artery disease) ([place])   . Pancreatitis 06/2015  . Sleep apnea    "don't have a mask" (09/23/2016)  . Stroke ([place]) 02/2015   a. 02/2015 Carotid U/S: 1-39% bilat ICA stenosis; "left me blind" (09/23/2016)  . Tobacco abuse    still smokes  . Tubular adenoma of colon     Past Surgical History:  Procedure Laterality Date  . ABDOMINAL AORTOGRAM W/LOWER  EXTREMITY N/A 09/23/2016   Procedure: Abdominal Aortogram w/Lower Extremity;  Surgeon: Wellington Hampshire, MD;  Location: [facility];  Service: Cardiovascular;  Laterality: N/A;  . CARDIAC CATHETERIZATION  11/07/2008   EF of 50-55% -- normal LV systolic function, acute inferolateral ST elevation MI,  PTCA of the circumflex artery -- [name]. [name], [name]  . CARDIAC CATHETERIZATION N/A 07/07/2016   Procedure: Left Heart Cath and Cors/Grafts Angiography;  Surgeon: [name], MD;  Location: [facility];  Service: Cardiovascular;  Laterality: N/A;  . CARDIAC CATHETERIZATION N/A 07/07/2016   Procedure: Coronary Stent Intervention;  Surgeon: [name], MD;  Location: [facility];  Service: Cardiovascular;  Laterality: N/A;  . CORONARY ANGIOPLASTY    . CORONARY ARTERY BYPASS GRAFT     "CABG X4"  . LEFT HEART CATH AND CORS/GRAFTS ANGIOGRAPHY N/A 10/01/2017   Procedure: LEFT HEART CATH AND CORS/GRAFTS ANGIOGRAPHY;  Surgeon: Burnell Blanks, MD;  Location: [facility];  Service: Cardiovascular;  Laterality: N/A;  . LEFT HEART CATHETERIZATION WITH CORONARY/GRAFT ANGIOGRAM N/A 05/15/2013   Procedure: LEFT HEART CATHETERIZATION WITH Beatrix Fetters;  Surgeon: [name], MD;  Location: [facility];  Service: Cardiovascular;  Laterality: N/A;  . PERIPHERAL VASCULAR INTERVENTION  09/23/2016   Procedure: Peripheral Vascular Intervention;  Surgeon: Wellington Hampshire, MD;  Location: [facility];  Service: Cardiovascular;;  Lt. SFA     Current Outpatient Medications  Medication Sig Dispense Refill  . aspirin EC 81 MG EC tablet Take 1 tablet (81 mg total) by mouth daily with breakfast. 30 tablet 2  . atorvastatin (LIPITOR) 80 MG tablet Take 1 tablet (80 mg total) by mouth daily at 6 PM. 90 tablet 3  . augmented betamethasone dipropionate (DIPROLENE-AF) 0.05 % cream Apply topically 2 (two) times daily. 60 g 0  . clopidogrel (PLAVIX) 75 MG tablet Take  1 tablet (75 mg total) by mouth daily. 90 tablet 0  . Evolocumab (REPATHA SURECLICK) [id number] MG/ML SOAJ Inject 1 pen into the skin every 14 (fourteen) days. 2 pen 11  . fenofibrate (TRICOR) 145 MG tablet Take 1 tablet (145 mg total) by mouth daily. 90 tablet 3  . folic acid (FOLVITE) 1 MG tablet Take 1 tablet (1 mg total) by mouth daily. 90 tablet 3  . isosorbide mononitrate (IMDUR) 60 MG 24 hr tablet TAKE 1 TABLET DAILY. PLEASE KEEP UPCOMING APPT IN JANUARY BEFORE ANYMORE REFILLS 90 tablet 3  . lisinopril (ZESTRIL) 10 MG tablet Take 1 tablet (10 mg total) by mouth daily. 90 tablet 3  . metoprolol tartrate (LOPRESSOR) 25 MG tablet Take 1 tablet (25 mg total) by mouth 2 (two) times daily. Please make overdue appt with Dr. Angelena Form before anymore refills. 3rd and Final Attempt 30 tablet 0  . Multiple Vitamin (MULTIVITAMIN WITH MINERALS) TABS tablet Take 1 tablet by mouth daily. 90 tablet 2  . nitroGLYCERIN (NITROSTAT) 0.4 MG SL tablet Place 1 tablet (0.4 mg total) under the tongue every  5 (five) minutes as needed. PLACE 1 TABLET (0.4 MG TOTAL) UNDER THE TONGUE EVERY 5 MINUTES AS NEEDED FOR CHEST PAIN. 30 tablet 2  . pantoprazole (PROTONIX) 40 MG tablet Take 1 tablet (40 mg total) by mouth 2 (two) times daily. Please make overdue appt with Dr. Angelena Form before anymore refills. 3rd and Final Attempt 30 tablet 0  . thiamine 100 MG tablet Take 1 tablet (100 mg total) by mouth daily. 90 tablet 3  . traZODone (DESYREL) 50 MG tablet Take 1 tablet (50 mg total) by mouth at bedtime. 90 tablet 3   No current facility-administered medications for this visit.    Allergies:   Patient has no known allergies.    Social History:  The patient  reports that he has been smoking cigarettes. He has a 43.00 pack-year smoking history. He has never used smokeless tobacco. He reports current alcohol use of about 14.0 standard drinks of alcohol per week. He reports that he does not use drugs.   Family History:  The patient's  family history includes Colon cancer in his father; Diabetes in his mother; Heart disease in his mother; Heart failure in his mother.    ROS:  Please see the history of present illness.   Otherwise, review of systems are positive for none.   All other systems are reviewed and negative.    PHYSICAL EXAM: VS:  BP (!) 150/70   Pulse 62   Temp (!) 97 F (36.1 C)   Ht 5\' 11"  (1.803 m)   Wt 235 lb 6.4 oz (106.8 kg)   SpO2 90%   BMI 32.83 kg/m  , BMI Body mass index is 32.83 kg/m. GEN: Well nourished, well developed, in no acute distress  HEENT: normal  Neck: no JVD, carotid bruits, or masses Cardiac: RRR; no murmurs, rubs, or gallops,no edema  Respiratory:  clear to auscultation bilaterally, normal work of breathing GI: soft, nontender, nondistended, + BS MS: no deformity or atrophy  Skin: warm and dry, no rash Neuro:  Strength and sensation are intact Psych: euthymic mood, full affect Vascular: Femoral pulse is +2 bilaterally.  Dorsalis pedis and posterior tibialis palpable bilaterally.   EKG:  EKG is ordered today. EKG showed sinus rhythm with early repolarization mostly noted in the inferior leads.   Recent Labs: 11/14/2019: BUN 12; Creatinine, Ser 0.90; Potassium 4.9; Sodium 133    Lipid Panel    Component Value Date/Time   CHOL 171 11/14/2019 1441   TRIG 97 11/14/2019 1441   HDL 37 (L) 11/14/2019 1441   CHOLHDL 4.6 11/14/2019 1441   CHOLHDL 4.3 11/27/2018 0441   VLDL 35 11/27/2018 0441   LDLCALC 116 (H) 11/14/2019 1441   LDLDIRECT 116.0 05/12/2013 1517      Wt Readings from Last 3 Encounters:  12/26/19 235 lb 6.4 oz (106.8 kg)  12/27/18 225 lb (102.1 kg)  11/26/18 228 lb (103.4 kg)       No flowsheet data found.    ASSESSMENT AND PLAN:  1.  Peripheral arterial disease: The patient had previous left SFA endovascular intervention.  He is known to have significant right SFA disease.  However, his current symptoms of right leg pain starting in the lower back  seem to be clearly out of proportion to his peripheral arterial disease.  His distal pulses are palpable.  Recent ABI was only mildly reduced.  I suspect that her symptoms are not due to PAD and more likely to be due to an LS spine pathology.  Continue medical therapy for PAD.  2. Coronary artery disease without angina: Cardiac cath in 2019 showed patent grafts.  3. Tobacco use: I again discussed with him the importance of smoking cessation.  4.  Mixed hyperlipidemia: He is currently on high-dose atorvastatin, Repatha and fenofibrate.   Disposition:   FU with me in 12 months  Signed,  Kathlyn Sacramento, MD  12/26/2019 12:49 PM    [place]

## 2019-12-26 NOTE — Patient Instructions (Signed)
Medication Instructions:  No changes *If you need a refill on your cardiac medications before your next appointment, please call your pharmacy*   Lab Work: None ordered If you have labs (blood work) drawn today and your tests are completely normal, you will receive your results only by: . MyChart Message (if you have MyChart) OR . A paper copy in the mail If you have any lab test that is abnormal or we need to change your treatment, we will call you to review the results.   Testing/Procedures: None ordered   Follow-Up: At [facility], you and your health needs are our priority.  As part of our continuing mission to provide you with exceptional heart care, we have created designated Provider Care Teams.  These Care Teams include your primary Cardiologist (physician) and Advanced Practice Providers (APPs -  Physician Assistants and Nurse Practitioners) who all work together to provide you with the care you need, when you need it.  We recommend signing up for the patient portal called "MyChart".  Sign up information is provided on this After Visit Summary.  MyChart is used to connect with patients for Virtual Visits (Telemedicine).  Patients are able to view lab/test results, encounter notes, upcoming appointments, etc.  Non-urgent messages can be sent to your provider as well.   To learn more about what you can do with MyChart, go to [email or web address].    Your next appointment:   12 month(s)  The format for your next appointment:   In Person  Provider:   Muhammad Arida, MD   

## 2019-12-27 ENCOUNTER — Telehealth: Payer: Self-pay

## 2019-12-27 ENCOUNTER — Ambulatory Visit (INDEPENDENT_AMBULATORY_CARE_PROVIDER_SITE_OTHER): Payer: Medicare HMO | Admitting: Internal Medicine

## 2019-12-27 ENCOUNTER — Encounter: Payer: Self-pay | Admitting: Internal Medicine

## 2019-12-27 VITALS — BP 143/59 | HR 65 | Temp 97.3°F | Resp 18 | Ht 71.0 in | Wt 233.0 lb

## 2019-12-27 DIAGNOSIS — M549 Dorsalgia, unspecified: Secondary | ICD-10-CM

## 2019-12-27 DIAGNOSIS — G4733 Obstructive sleep apnea (adult) (pediatric): Secondary | ICD-10-CM

## 2019-12-27 DIAGNOSIS — F17200 Nicotine dependence, unspecified, uncomplicated: Secondary | ICD-10-CM

## 2019-12-27 DIAGNOSIS — R0902 Hypoxemia: Secondary | ICD-10-CM

## 2019-12-27 DIAGNOSIS — R739 Hyperglycemia, unspecified: Secondary | ICD-10-CM | POA: Diagnosis not present

## 2019-12-27 DIAGNOSIS — R0600 Dyspnea, unspecified: Secondary | ICD-10-CM | POA: Diagnosis not present

## 2019-12-27 DIAGNOSIS — R0609 Other forms of dyspnea: Secondary | ICD-10-CM

## 2019-12-27 DIAGNOSIS — J449 Chronic obstructive pulmonary disease, unspecified: Secondary | ICD-10-CM

## 2019-12-27 MED ORDER — TUDORZA PRESSAIR 400 MCG/ACT IN AEPB: 1.0000 | INHALATION_SPRAY | Freq: Every day | RESPIRATORY_TRACT | 3 refills | Status: DC

## 2019-12-27 NOTE — Telephone Encounter (Signed)
Caprice Renshaw not covered by [employer]. Preferred alternatives: Spiriva Respimat solution for inhalation or Spiriva Handihaler.

## 2019-12-27 NOTE — Progress Notes (Signed)
SATURATION QUALIFICATIONS: (This note is used to comply with regulatory documentation for home oxygen)  Patient Saturations on Room Air at Rest = 93%  Patient Saturations on Room Air while Ambulating = 87%  Patient Saturations on  Liters of oxygen while Ambulating = %  Please briefly explain why patient needs home oxygen:

## 2019-12-27 NOTE — Progress Notes (Signed)
Subjective:    Patient ID: [name]., male    DOB: 03-25-1961, 59 y.o.   MRN: [id number]  DOS:  12/27/2019 Type of visit - description: Last office visit 11-2018. Multiple ssues discussed.  Note from cardiology, cholesterol clinic and Dr. Fletcher Anon reviewed.  Many years history of right leg pain, goes from the buttock down to the foot.  COPD?  He has a long history of DOE, he thinks is emphysema.  Requests a inhaler.  EtOH: Still drinks  OSA: Not using a CPAP   Review of Systems Denies chest pain or lower extremity edema He continues smoking but denies cough, sputum production or wheezing.  No hemoptysis  Past Medical History:  Diagnosis Date  . Abnormality of gait   . Anxiety   . CAD (coronary artery disease)    a. 10/2008 Inferolateral STEMI: 3VD w/ occluded LCX (PTCA)-->CABG x 3 (LIMA->LAD, VG->OM, VG->PDA);  b. 06/2009 NSTEMI/PCI: VG->OM 100 (BMS);  c. 05/2013 native 3VD, 3/3 patent grafts. d. 06/2016: Inferior STEMI w/ occluded SVG-OM (DES placed). LIMA-LAD and SVG-PDA patent.  . Chronic bronchitis ([place])   . Chronic diastolic CHF (congestive heart failure) ([place])    a. 02/2015 Echo: EF 50-55%, distal septal, apical, inferobasal HK, mild LVH, mild MR, mildly dil LA.  [name] Cocaine use   . Colon polyps    a. 09/2015 colonscopy: multiple sessile polyps, path negative for high grade dysplasia.  . Depression   . Emphysema (subcutaneous) (surgical) resulting from a procedure 10/2008.   Bleb resected at time of 10/2008 CABG. Marketed emphysema noted on surgical report.  . Esophagitis    a. 2017 EGD: esophagitis, duodenitis.  [name] ETOH abuse   . GERD (gastroesophageal reflux disease)   . Hemianopia, homonymous, right    "can't see out the right side of either eye since stroke in 2016/pt description 09/23/2016  . Hepatic steatosis   . Hiatal hernia   . History of nuclear stress test 08/2017   Nuclear stress test 1/19: EF 35, inf-lat and ant-lat, apical sept/apical scar; no  ischemia; Intermediate Risk  . Hyperlipidemia   . Hypertension   . Iron deficiency anemia 2010  . Morbid obesity ([place])   . Myocardial infarction ([facility]) "several"  . PAD (peripheral artery disease) ([place])   . Pancreatitis 06/2015  . Sleep apnea    "don't have a mask" (09/23/2016)  . Stroke ([place]) 02/2015   a. 02/2015 Carotid U/S: 1-39% bilat ICA stenosis; "left me blind" (09/23/2016)  . Tobacco abuse    still smokes  . Tubular adenoma of colon     Past Surgical History:  Procedure Laterality Date  . ABDOMINAL AORTOGRAM W/LOWER EXTREMITY N/A 09/23/2016   Procedure: Abdominal Aortogram w/Lower Extremity;  Surgeon: Wellington Hampshire, MD;  Location: [place] [facility];  Service: Cardiovascular;  Laterality: N/A;  . CARDIAC CATHETERIZATION  11/07/2008   EF of 50-55% -- normal LV systolic function, acute inferolateral ST elevation MI,  PTCA of the circumflex artery -- [name]. [name], [name]  . CARDIAC CATHETERIZATION N/A 07/07/2016   Procedure: Left Heart Cath and Cors/Grafts Angiography;  Surgeon: [name], MD;  Location: [place] [facility];  Service: Cardiovascular;  Laterality: N/A;  . CARDIAC CATHETERIZATION N/A 07/07/2016   Procedure: Coronary Stent Intervention;  Surgeon: [name], MD;  Location: [place] [facility];  Service: Cardiovascular;  Laterality: N/A;  . CORONARY ANGIOPLASTY    . CORONARY ARTERY BYPASS GRAFT     "CABG X4"  .  LEFT HEART CATH AND CORS/GRAFTS ANGIOGRAPHY N/A 10/01/2017   Procedure: LEFT HEART CATH AND CORS/GRAFTS ANGIOGRAPHY;  Surgeon: Burnell Blanks, MD;  Location: [place] [facility];  Service: Cardiovascular;  Laterality: N/A;  . LEFT HEART CATHETERIZATION WITH CORONARY/GRAFT ANGIOGRAM N/A 05/15/2013   Procedure: LEFT HEART CATHETERIZATION WITH Beatrix Fetters;  Surgeon: [name], MD;  [facility];  Service: Cardiovascular;  Laterality: N/A;  . PERIPHERAL VASCULAR INTERVENTION  09/23/2016   Procedure: Peripheral Vascular  Intervention;  Surgeon: Wellington Hampshire, MD;  Location: [place] [facility];  Service: Cardiovascular;;  Lt. SFA    Allergies as of 12/27/2019   No Known Allergies     Medication List       Accurate as of Dec 27, 2019 11:59 PM. If you have any questions, ask your nurse or doctor.        aspirin 81 MG EC tablet Take 1 tablet (81 mg total) by mouth daily with breakfast.   atorvastatin 80 MG tablet Commonly known as: LIPITOR Take 1 tablet (80 mg total) by mouth daily at 6 PM.   augmented betamethasone dipropionate 0.05 % cream Commonly known as: DIPROLENE-AF Apply topically 2 (two) times daily.   clopidogrel 75 MG tablet Commonly known as: PLAVIX Take 1 tablet (75 mg total) by mouth daily.   fenofibrate 145 MG tablet Commonly known as: Tricor Take 1 tablet (145 mg total) by mouth daily.   folic acid 1 MG tablet Commonly known as: FOLVITE Take 1 tablet (1 mg total) by mouth daily.   isosorbide mononitrate 60 MG 24 hr tablet Commonly known as: IMDUR TAKE 1 TABLET DAILY. PLEASE KEEP UPCOMING APPT IN JANUARY BEFORE ANYMORE REFILLS   lisinopril 10 MG tablet Commonly known as: ZESTRIL Take 1 tablet (10 mg total) by mouth daily.   metoprolol tartrate 25 MG tablet Commonly known as: LOPRESSOR Take 1 tablet (25 mg total) by mouth 2 (two) times daily. Please make overdue appt with Dr. Angelena Form before anymore refills. 3rd and Final Attempt   multivitamin with minerals Tabs tablet Take 1 tablet by mouth daily.   nitroGLYCERIN 0.4 MG SL tablet Commonly known as: Nitrostat Place 1 tablet (0.4 mg total) under the tongue every 5 (five) minutes as needed. PLACE 1 TABLET (0.4 MG TOTAL) UNDER THE TONGUE EVERY 5 MINUTES AS NEEDED FOR CHEST PAIN.   pantoprazole 40 MG tablet Commonly known as: PROTONIX Take 1 tablet (40 mg total) by mouth 2 (two) times daily. Please make overdue appt with Dr. Angelena Form before anymore refills. 3rd and Final Attempt   Repatha SureClick [id number] MG/ML So[place]  Generic drug: Evolocumab Inject 1 pen into the skin every 14 (fourteen) days.   Spiriva Respimat 1.25 MCG/ACT Aers Generic drug: Tiotropium Bromide Monohydrate Inhale 2 puffs into the lungs daily. Started by: Bishop Dublin, CMA   thiamine 100 MG tablet Take 1 tablet (100 mg total) by mouth daily.   traZODone 50 MG tablet Commonly known as: DESYREL Take 1 tablet (50 mg total) by mouth at bedtime.   Tudorza Pressair 400 MCG/ACT Aepb Generic drug: Aclidinium Bromide Inhale 1 puff into the lungs daily. Started by: [name], MD          Objective:   Physical Exam BP (!) 143/59 (BP Location: Left Arm, Patient Position: Sitting, Cuff Size: Normal)   Pulse 65   Temp (!) 97.3 F (36.3 C) (Temporal)   Resp 18   Ht 5\' 11"  (1.803 m)   Wt 233 lb (  105.7 kg)   SpO2 94%   BMI 32.50 kg/m  General:   Well developed, NAD, BMI noted. HEENT:  Normocephalic . Face symmetric, atraumatic Lungs:  decreased breath sounds. Normal respiratory effort, no intercostal retractions, no accessory muscle use. Heart: RRR,  no murmur.  Lower extremities: no pretibial edema bilaterally  Skin: Not pale. Not jaundice Neurologic:  alert & oriented X3.  Speech normal, gait appropriate for age and unassisted Motor: Symmetric DTRs: Normal knee jerks, slightly decreased right ankle jerk. Psych--  Cognition and judgment appear intact.  Cooperative with normal attention span and concentration.  Behavior appropriate. No anxious or depressed appearing.      Assessment      Assessment   Prediabetes  HTN Dyslipidemia CV: --CAD: 2010 angioplasty f/u by  CABG 2010, cath 2014 -- CP, cath 09/2017, patent grafts, medical treatment --PVD --Stroke 2008, 02-2015, 2020 Anxiety depression  OSA: Sleep study 01/2017, significant sleep apnea noted Abnormal gait GI: Iron def anemia dx 03-2015  ---cscope 09-2015 multiple polyps, repeat in one year ---EGD-2017: Esophagitis, duodenitis, bx done Pancreatitis  06/2015.(EtOH 2 days prior to onset of symptoms)  etoh abuse H/o cocaine  Smoker  +FH: father prostate ca , dx late 60s  Plan: Last office visit 11/2018, virtual.  Recommend to follow-up here regularly. Prediabetes: Check A1c HTN: BP today 143/59, the last time he checked at home 154/40, he does not check frequently.  Currently on Imdur, lisinopril, metoprolol.  No change for now High cholesterol: Seen by the cholesterol clinic 11/27/2019, LDL was noted to be above goal, despite taking Lipitor and fenofibrate, they recommended Repatha. PVD: Saw Dr. Fletcher Anon yesterday, complaining of right leg pain, not felt to be related to vascular disease rather MSK issue. Right leg pain: Refer to Ortho, suspect MSK issue. Tobacco abuse: Ongoing, smokes 1.5 pack a day, plans to quit, when ready okay to use Wellbutrin ( reports could not tolerate Chantix before). EtOH: Still drinks 2-3 beers daily COPD? The patient is a longtime smoker, no cough, ++ DOE, last EKG is okay, no chest pain.  Suspect DOE is at least in part from COPD.  Requests a inhaler. Oxygen dropped from 93 to 87% after a brief walk. Plan: Start Caprice Renshaw, refer to pulmonary, likely will need PFTs (previous referral failed) and ONO. OSA: Not using CPAP encouraged to do, referring to pulmonary. Compliance: Reports he is taking "all medicines" although some of them has not been refilled in a while. RTC 1 months    This visit occurred during the SARS-CoV-2 public health emergency.  Safety protocols were in place, including screening questions prior to the visit, additional usage of staff PPE, and extensive cleaning of exam room while observing appropriate contact time as indicated for disinfecting solutions.

## 2019-12-27 NOTE — Telephone Encounter (Signed)
Change to Spiriva Respimat 1 puff daily

## 2019-12-27 NOTE — Progress Notes (Signed)
Pre visit review using our clinic review tool, if applicable. No additional management support is needed unless otherwise documented below in the visit note. 

## 2019-12-27 NOTE — Patient Instructions (Addendum)
Please schedule Medicare Wellness with Glenard Haring.   Start taking Caprice Renshaw  We are referring you to the pulmonary doctor to help you with emphysema, sleep apnea and low oxygen  We are referring you to the orthopedic doctor for the right leg pain  When you are ready to quit tobacco you may consider use Wellbutrin or Chantix, please make an appointment  Take the medications according to the list provided, call if you need a refill  Next visit in 1 month

## 2019-12-28 ENCOUNTER — Telehealth: Payer: Self-pay | Admitting: Internal Medicine

## 2019-12-28 LAB — CBC WITH DIFFERENTIAL/PLATELET
Basophils Absolute: 0.1 10*3/uL (ref 0.0–0.1)
Basophils Relative: 1.3 % (ref 0.0–3.0)
Eosinophils Absolute: 0.2 10*3/uL (ref 0.0–0.7)
Eosinophils Relative: 2.3 % (ref 0.0–5.0)
HCT: 47.6 % (ref 39.0–52.0)
Hemoglobin: 15.5 g/dL (ref 13.0–17.0)
Lymphocytes Relative: 17.5 % (ref 12.0–46.0)
Lymphs Abs: 1.3 10*3/uL (ref 0.7–4.0)
MCHC: 32.6 g/dL (ref 30.0–36.0)
MCV: 85.2 fl (ref 78.0–100.0)
Monocytes Absolute: 1.1 10*3/uL — ABNORMAL HIGH (ref 0.1–1.0)
Monocytes Relative: 14.6 % — ABNORMAL HIGH (ref 3.0–12.0)
Neutro Abs: 4.7 10*3/uL (ref 1.4–7.7)
Neutrophils Relative %: 64.3 % (ref 43.0–77.0)
Platelets: 221 10*3/uL (ref 150.0–400.0)
RBC: 5.59 Mil/uL (ref 4.22–5.81)
RDW: 19.6 % — ABNORMAL HIGH (ref 11.5–15.5)
WBC: 7.4 10*3/uL (ref 4.0–10.5)

## 2019-12-28 LAB — HEMOGLOBIN A1C: Hgb A1c MFr Bld: 6.7 % — ABNORMAL HIGH (ref 4.6–6.5)

## 2019-12-28 MED ORDER — SPIRIVA RESPIMAT 1.25 MCG/ACT IN AERS
2.0000 | INHALATION_SPRAY | Freq: Every day | RESPIRATORY_TRACT | 3 refills | Status: DC
Start: 2019-12-28 — End: 2020-03-18

## 2019-12-28 NOTE — Telephone Encounter (Signed)
Caller: Jarae Call back number: [phone]  Patient is calling in regards to  Aclidinium Bromide (TUDORZA PRESSAIR) 400 MCG/ACT AEPB. Patient states he was unable to obtain medicine. Per pharmacy doctor office must obtain a prior authorization.  Please advise.

## 2019-12-28 NOTE — Assessment & Plan Note (Signed)
Last office visit 11/2018, virtual.  Recommend to follow-up here regularly. Prediabetes: Check A1c HTN: BP today 143/59, the last time he checked at home 154/40, he does not check frequently.  Currently on Imdur, lisinopril, metoprolol.  No change for now High cholesterol: Seen by the cholesterol clinic 11/27/2019, LDL was noted to be above goal, despite taking Lipitor and fenofibrate, they recommended Repatha. PVD: Saw Dr. Fletcher Anon yesterday, complaining of right leg pain, not felt to be related to vascular disease rather MSK issue. Right leg pain: Refer to Ortho, suspect MSK issue. Tobacco abuse: Ongoing, smokes 1.5 pack a day, plans to quit, when ready okay to use Wellbutrin ( reports could not tolerate Chantix before). EtOH: Still drinks 2-3 beers daily COPD? The patient is a longtime smoker, no cough, ++ DOE, last EKG is okay, no chest pain.  Suspect DOE is at least in part from COPD.  Requests a inhaler. Oxygen dropped from 93 to 87% after a brief walk. Plan: Start Caprice Renshaw, refer to pulmonary, likely will need PFTs (previous referral failed) and ONO. OSA: Not using CPAP encouraged to do, referring to pulmonary. Compliance: Reports he is taking "all medicines" although some of them has not been refilled in a while. RTC 1 months

## 2019-12-28 NOTE — Telephone Encounter (Signed)
Which dosage would you like? 1.25mg  or 2.5mg ?

## 2019-12-28 NOTE — Telephone Encounter (Signed)
1.25 mg to begin with, we could increase later if needed

## 2019-12-28 NOTE — Telephone Encounter (Signed)
Changed to Spiriva

## 2019-12-28 NOTE — Telephone Encounter (Signed)
Rx sent 

## 2020-01-01 ENCOUNTER — Other Ambulatory Visit: Payer: Self-pay

## 2020-01-01 ENCOUNTER — Ambulatory Visit (INDEPENDENT_AMBULATORY_CARE_PROVIDER_SITE_OTHER): Payer: Medicare HMO | Admitting: Cardiovascular Disease

## 2020-01-01 ENCOUNTER — Encounter: Payer: Self-pay | Admitting: Cardiovascular Disease

## 2020-01-01 VITALS — BP 146/82 | HR 64 | Ht 71.0 in | Wt 237.0 lb

## 2020-01-01 DIAGNOSIS — I110 Hypertensive heart disease with heart failure: Secondary | ICD-10-CM | POA: Diagnosis not present

## 2020-01-01 DIAGNOSIS — I509 Heart failure, unspecified: Secondary | ICD-10-CM | POA: Diagnosis not present

## 2020-01-01 DIAGNOSIS — I10 Essential (primary) hypertension: Secondary | ICD-10-CM | POA: Diagnosis not present

## 2020-01-01 DIAGNOSIS — I25810 Atherosclerosis of coronary artery bypass graft(s) without angina pectoris: Secondary | ICD-10-CM | POA: Diagnosis not present

## 2020-01-01 DIAGNOSIS — I739 Peripheral vascular disease, unspecified: Secondary | ICD-10-CM

## 2020-01-01 MED ORDER — REPATHA SURECLICK 140 MG/ML ~~LOC~~ SOAJ: 1.0000 "pen " | SUBCUTANEOUS | 11 refills | Status: DC

## 2020-01-01 NOTE — Patient Instructions (Signed)
Medication Instructions:  No changes *If you need a refill on your cardiac medications before your next appointment, please call your pharmacy*   Lab Work: none If you have labs (blood work) drawn today and your tests are completely normal, you will receive your results only by: . MyChart Message (if you have MyChart) OR . A paper copy in the mail If you have any lab test that is abnormal or we need to change your treatment, we will call you to review the results.   Testing/Procedures: none   Follow-Up: At [facility], you and your health needs are our priority.  As part of our continuing mission to provide you with exceptional heart care, we have created designated Provider Care Teams.  These Care Teams include your primary Cardiologist (physician) and Advanced Practice Providers (APPs -  Physician Assistants and Nurse Practitioners) who all work together to provide you with the care you need, when you need it.  We recommend signing up for the patient portal called "MyChart".  Sign up information is provided on this After Visit Summary.  MyChart is used to connect with patients for Virtual Visits (Telemedicine).  Patients are able to view lab/test results, encounter notes, upcoming appointments, etc.  Non-urgent messages can be sent to your provider as well.   To learn more about what you can do with MyChart, go to [email or web address].    Your next appointment:   12 month(s)  The format for your next appointment:   In Person  Provider:   You may see Christopher McAlhany, MD or one of the following Advanced Practice Providers on your designated Care Team:    Dayna Dunn, PA-C  Michele Lenze, PA-C    Other Instructions   

## 2020-01-01 NOTE — Progress Notes (Signed)
Chief Complaint  Patient presents with  . Follow-up    CAD   History of Present Illness:  59 yo male with history of HTN, hyperlipidemia, tobacco abuse, former cocaine abuse, PAD and CAD here today for cardiac follow up. He initially presented with an inferolateral STEMI in March 2010 at which time his Circumflex was occluded and was opened with a balloon. He underwent emergent 3V CABG (LIMA to LAD, SVG to Circumflex, SVG to PDA) given his diffuse multivessel disease. In November 2010 he presented with a NSTEMI and had bare metal stenting of the totally occluded saphenous vein graft to the obtuse marginal artery. Cardiac cath October 2014 with 3 patent bypass grafts. Admitted July 2016 to [facility] with CVA. Echo 02/21/15 with normal LV function. Carotid dopplers July 2016 with mild bilateral carotid disease. Admitted to [facility] 07/07/16 with lateral STEMI secondary to acute occlusion of the SVG to the OM. A Synergy DES was placed in the body of the SVG to the OM. Normal LV function by LV gram. He had dyspnea with Brilinta and was discharged on ASA and Plavix. He was referred to DR. Arida for PV workup. Lower ext angiogram 09/23/16 with occluded left SFA which was treated with atherectomy, drug coated balloon angioplasty and stenting. Moderate disease in right SFA. He was seen by Dr. Fletcher Anon in the [facility] clinic in May 2021 and his LE disease was felt to be stable. Cardiac cath 10/01/17 with occlusion of all native vessels with 3 patent bypass grafts. He had an acute ischemic CVA in April 2020. Echo April 2020 with LVEF=60-65%. He refused rehab and signed out AMA. He was seen by telemedicine visit march 2021 and was stable.   He is here today for follow up. The patient denies any chest pain, dyspnea, palpitations, lower extremity edema, orthopnea, PND, dizziness, near syncope or syncope. He has ongoing issues with dyspnea. His legs hurt every day. He has back pain. Pending referral to ortho. Still smoking.   Primary  Care Physician: Colon Branch, MD   Past Medical History:  Diagnosis Date  . Abnormality of gait   . Anxiety   . CAD (coronary artery disease)    a. 10/2008 Inferolateral STEMI: 3VD w/ occluded LCX (PTCA)-->CABG x 3 (LIMA->LAD, VG->OM, VG->PDA);  b. 06/2009 NSTEMI/PCI: VG->OM 100 (BMS);  c. 05/2013 native 3VD, 3/3 patent grafts. d. 06/2016: Inferior STEMI w/ occluded SVG-OM (DES placed). LIMA-LAD and SVG-PDA patent.  . Chronic bronchitis ([place])   . Chronic diastolic CHF (congestive heart failure) ([place])    a. 02/2015 Echo: EF 50-55%, distal septal, apical, inferobasal HK, mild LVH, mild MR, mildly dil LA.  [name] Cocaine use   . Colon polyps    a. 09/2015 colonscopy: multiple sessile polyps, path negative for high grade dysplasia.  . Depression   . Emphysema (subcutaneous) (surgical) resulting from a procedure 10/2008.   Bleb resected at time of 10/2008 CABG. Marketed emphysema noted on surgical report.  . Esophagitis    a. 2017 EGD: esophagitis, duodenitis.  [name] ETOH abuse   . GERD (gastroesophageal reflux disease)   . Hemianopia, homonymous, right    "can't see out the right side of either eye since stroke in 2016/pt description 09/23/2016  . Hepatic steatosis   . Hiatal hernia   . History of nuclear stress test 08/2017   Nuclear stress test 1/19: EF 35, inf-lat and ant-lat, apical sept/apical scar; no ischemia; Intermediate Risk  . Hyperlipidemia   . Hypertension   . Iron deficiency  anemia 2010  . Morbid obesity ([place])   . Myocardial infarction ([place]) "several"  . PAD (peripheral artery disease) ([place])   . Pancreatitis 06/2015  . Sleep apnea    "don't have a mask" (09/23/2016)  . Stroke ([place]) 02/2015   a. 02/2015 Carotid U/S: 1-39% bilat ICA stenosis; "left me blind" (09/23/2016)  . Tobacco abuse    still smokes  . Tubular adenoma of colon     Past Surgical History:  Procedure Laterality Date  . ABDOMINAL AORTOGRAM W/LOWER EXTREMITY N/A 09/23/2016   Procedure: Abdominal Aortogram w/Lower  Extremity;  Surgeon: Wellington Hampshire, MD;  Location: [place] [facility];  Service: Cardiovascular;  Laterality: N/A;  . CARDIAC CATHETERIZATION  11/07/2008   EF of 50-55% -- normal LV systolic function, acute inferolateral ST elevation MI,  PTCA of the circumflex artery -- [name]. [name], [name]  . CARDIAC CATHETERIZATION N/A 07/07/2016   Procedure: Left Heart Cath and Cors/Grafts Angiography;  Surgeon: [name], MD;  Location: [place] [facility];  Service: Cardiovascular;  Laterality: N/A;  . CARDIAC CATHETERIZATION N/A 07/07/2016   Procedure: Coronary Stent Intervention;  Surgeon: [name], MD;  Location: [place] [facility];  Service: Cardiovascular;  Laterality: N/A;  . CORONARY ANGIOPLASTY    . CORONARY ARTERY BYPASS GRAFT     "CABG X4"  . LEFT HEART CATH AND CORS/GRAFTS ANGIOGRAPHY N/A 10/01/2017   Procedure: LEFT HEART CATH AND CORS/GRAFTS ANGIOGRAPHY;  Surgeon: Burnell Blanks, MD;  Location: [place] [facility];  Service: Cardiovascular;  Laterality: N/A;  . LEFT HEART CATHETERIZATION WITH CORONARY/GRAFT ANGIOGRAM N/A 05/15/2013   Procedure: LEFT HEART CATHETERIZATION WITH Beatrix Fetters;  Surgeon: [name], MD;  Location: [facility];  Service: Cardiovascular;  Laterality: N/A;  . PERIPHERAL VASCULAR INTERVENTION  09/23/2016   Procedure: Peripheral Vascular Intervention;  Surgeon: Wellington Hampshire, MD;  Location: [place] [facility];  Service: Cardiovascular;;  Lt. SFA    Current Outpatient Medications  Medication Sig Dispense Refill  . Aclidinium Bromide (TUDORZA PRESSAIR) 400 MCG/ACT AEPB Inhale 1 puff into the lungs daily. 1 each 3  . aspirin EC 81 MG EC tablet Take 1 tablet (81 mg total) by mouth daily with breakfast. 30 tablet 2  . atorvastatin (LIPITOR) 80 MG tablet Take 1 tablet (80 mg total) by mouth daily at 6 PM. 90 tablet 3  . augmented betamethasone dipropionate (DIPROLENE-AF) 0.05 % cream Apply topically 2 (two) times daily. 60 g 0  .  clopidogrel (PLAVIX) 75 MG tablet Take 1 tablet (75 mg total) by mouth daily. 90 tablet 0  . Evolocumab (REPATHA SURECLICK) [id number] MG/ML SOAJ Inject 1 pen into the skin every 14 (fourteen) days. 2 pen 11  . fenofibrate (TRICOR) 145 MG tablet Take 1 tablet (145 mg total) by mouth daily. 90 tablet 3  . folic acid (FOLVITE) 1 MG tablet Take 1 tablet (1 mg total) by mouth daily. 90 tablet 3  . isosorbide mononitrate (IMDUR) 60 MG 24 hr tablet TAKE 1 TABLET DAILY. PLEASE KEEP UPCOMING APPT IN JANUARY BEFORE ANYMORE REFILLS 90 tablet 3  . lisinopril (ZESTRIL) 10 MG tablet Take 1 tablet (10 mg total) by mouth daily. 90 tablet 3  . metoprolol tartrate (LOPRESSOR) 25 MG tablet Take 1 tablet (25 mg total) by mouth 2 (two) times daily. Please make overdue appt with Dr. Angelena Form before anymore refills. 3rd and Final Attempt 30 tablet 0  . Multiple Vitamin (MULTIVITAMIN WITH MINERALS) TABS tablet Take 1 tablet  by mouth daily. 90 tablet 2  . nitroGLYCERIN (NITROSTAT) 0.4 MG SL tablet Place 1 tablet (0.4 mg total) under the tongue every 5 (five) minutes as needed. PLACE 1 TABLET (0.4 MG TOTAL) UNDER THE TONGUE EVERY 5 MINUTES AS NEEDED FOR CHEST PAIN. 30 tablet 2  . pantoprazole (PROTONIX) 40 MG tablet Take 1 tablet (40 mg total) by mouth 2 (two) times daily. Please make overdue appt with Dr. Angelena Form before anymore refills. 3rd and Final Attempt 30 tablet 0  . thiamine 100 MG tablet Take 1 tablet (100 mg total) by mouth daily. 90 tablet 3  . Tiotropium Bromide Monohydrate (SPIRIVA RESPIMAT) 1.25 MCG/ACT AERS Inhale 2 puffs into the lungs daily. 4 g 3  . traZODone (DESYREL) 50 MG tablet Take 1 tablet (50 mg total) by mouth at bedtime. 90 tablet 3   No current facility-administered medications for this visit.    No Known Allergies  Social History   Socioeconomic History  . Marital status: Single    Spouse name: Not on file  . Number of children: 2  . Years of education: Not on file  . Highest education level:  Not on file  Occupational History  . Occupation: not working , used to be a Chief Executive Officer   . Occupation: disable  Tobacco Use  . Smoking status: Current Every Day Smoker    Packs/day: 1.00    Years: 43.00    Pack years: 43.00    Types: Cigarettes  . Smokeless tobacco: Never Used  Substance and Sexual Activity  . Alcohol use: Yes    Alcohol/week: 14.0 standard drinks    Types: 14 Cans of beer per week    Comment: as off 12/2017: beer 12-pack q week  . Drug use: No    Comment: 09/23/2016 "none in years"  . Sexual activity: Not Currently  Other Topics Concern  . Not on file  Social History Narrative   Lives w/ girlfriend    Social Determinants of Health   Financial Resource Strain:   . Difficulty of Paying Living Expenses:   Food Insecurity:   . Worried About Charity fundraiser in the Last Year:   . Arboriculturist in the Last Year:   Transportation Needs:   . Film/video editor (Medical):   [name] Lack of Transportation (Non-Medical):   Physical Activity:   . Days of Exercise per Week:   . Minutes of Exercise per Session:   Stress:   . Feeling of Stress :   Social Connections:   . Frequency of Communication with Friends and Family:   . Frequency of Social Gatherings with Friends and Family:   . Attends Religious Services:   . Active Member of Clubs or Organizations:   . Attends Archivist Meetings:   [name] Marital Status:   Intimate Partner Violence:   . Fear of Current or Ex-Partner:   . Emotionally Abused:   [name] Physically Abused:   . Sexually Abused:     Family History  Problem Relation Age of Onset  . Heart disease Mother   . Heart failure Mother   . Diabetes Mother   . Colon cancer Father        late 76s  . Prostate cancer Neg Hx     Review of Systems:  As stated in the HPI and otherwise negative.   BP (!) 146/82   Pulse 64   Ht 5\' 11"  (1.803 m)   Wt 237 lb (107.5 kg)  SpO2 93%   BMI 33.05 kg/m   Physical Examination: General: Well  developed, well nourished, NAD  HEENT: OP clear, mucus membranes moist  SKIN: warm, dry. No rashes. Neuro: No focal deficits  Musculoskeletal: Muscle strength 5/5 all ext  Psychiatric: Mood and affect normal  Neck: No JVD, no carotid bruits, no thyromegaly, no lymphadenopathy.  Lungs:Clear bilaterally, no wheezes, rhonci, crackles Cardiovascular: Regular rate and rhythm. No murmurs, gallops or rubs. Abdomen:Soft. Bowel sounds present. Non-tender.  Extremities: No lower extremity edema. Pulses are 2 + in the bilateral DP/PT.  Echo 02/21/15: Procedure narrative: Transthoracic echocardiography. Image quality was adequate. The study was technically difficult due to variations in respirations. - Left ventricle: Distal septal, apical and inferobasal hypokinesis The cavity size was normal. Wall thickness was increased in a pattern of mild LVH. Systolic function was normal. The estimated ejection fraction was in the range of 50% to 55%. - Mitral valve: There was mild regurgitation. - Left atrium: The atrium was mildly dilated. - Atrial septum: No defect or patent foramen ovale was identified.  EKG:  EKG is not ordered today. The ekg ordered today demonstrates   Recent Labs: 11/14/2019: BUN 12; Creatinine, Ser 0.90; Potassium 4.9; Sodium 133 12/27/2019: Hemoglobin 15.5; Platelets 221.0   Lipid Panel    Component Value Date/Time   CHOL 171 11/14/2019 1441   TRIG 97 11/14/2019 1441   HDL 37 (L) 11/14/2019 1441   CHOLHDL 4.6 11/14/2019 1441   CHOLHDL 4.3 11/27/2018 0441   VLDL 35 11/27/2018 0441   LDLCALC 116 (H) 11/14/2019 1441   LDLDIRECT 116.0 05/12/2013 1517     Wt Readings from Last 3 Encounters:  01/01/20 237 lb (107.5 kg)  12/27/19 233 lb (105.7 kg)  12/26/19 235 lb 6.4 oz (106.8 kg)     Other studies Reviewed: Additional studies/ records that were reviewed today include: . Review of the above records demonstrates:    Assessment and Plan:   1. CAD without  angina: He has no chest pain. He is s/p 3V CABG in March 2010 and has had bare metal stent to SVG to Circumflex in November 2010 as well as DES placed in the body of the SVG to OM in November 2017 in the setting of lateral STEMI. LV function is normal by LV gram 2020. Continue ASA, Plavix, statin and beta blocker.   2. HTN: BP is well controlled. Continue current therapy  3. PAD: Followed in [facility] by Dr. Fletcher Anon.   Current medicines are reviewed at length with the patient today.  The patient does not have concerns regarding medicines.  The following changes have been made:  no change  Labs/ tests ordered today include:  No orders of the defined types were placed in this encounter.   Disposition:   FU with me in 12  months  Signed, [name], MD 01/01/2020 4:10 PM    [facility] [place], [place],   [address] Phone: [phone]; Fax: [phone]

## 2020-01-04 DIAGNOSIS — M48062 Spinal stenosis, lumbar region with neurogenic claudication: Secondary | ICD-10-CM | POA: Diagnosis not present

## 2020-01-04 DIAGNOSIS — M545 Low back pain: Secondary | ICD-10-CM | POA: Diagnosis not present

## 2020-01-17 DIAGNOSIS — M545 Low back pain: Secondary | ICD-10-CM | POA: Diagnosis not present

## 2020-01-17 DIAGNOSIS — M25571 Pain in right ankle and joints of right foot: Secondary | ICD-10-CM | POA: Diagnosis not present

## 2020-01-17 DIAGNOSIS — S8261XA Displaced fracture of lateral malleolus of right fibula, initial encounter for closed fracture: Secondary | ICD-10-CM | POA: Diagnosis not present

## 2020-01-24 DIAGNOSIS — Z8673 Personal history of transient ischemic attack (TIA), and cerebral infarction without residual deficits: Secondary | ICD-10-CM | POA: Diagnosis not present

## 2020-01-24 DIAGNOSIS — I25119 Atherosclerotic heart disease of native coronary artery with unspecified angina pectoris: Secondary | ICD-10-CM | POA: Diagnosis not present

## 2020-01-24 DIAGNOSIS — I739 Peripheral vascular disease, unspecified: Secondary | ICD-10-CM | POA: Diagnosis not present

## 2020-01-24 DIAGNOSIS — M5136 Other intervertebral disc degeneration, lumbar region: Secondary | ICD-10-CM | POA: Diagnosis not present

## 2020-01-24 DIAGNOSIS — M5126 Other intervertebral disc displacement, lumbar region: Secondary | ICD-10-CM | POA: Diagnosis not present

## 2020-01-24 DIAGNOSIS — M545 Low back pain: Secondary | ICD-10-CM | POA: Diagnosis not present

## 2020-01-24 DIAGNOSIS — E669 Obesity, unspecified: Secondary | ICD-10-CM | POA: Diagnosis not present

## 2020-01-25 DIAGNOSIS — S8263XA Displaced fracture of lateral malleolus of unspecified fibula, initial encounter for closed fracture: Secondary | ICD-10-CM | POA: Insufficient documentation

## 2020-01-25 DIAGNOSIS — F17200 Nicotine dependence, unspecified, uncomplicated: Secondary | ICD-10-CM | POA: Diagnosis not present

## 2020-01-25 DIAGNOSIS — S8261XD Displaced fracture of lateral malleolus of right fibula, subsequent encounter for closed fracture with routine healing: Secondary | ICD-10-CM | POA: Diagnosis not present

## 2020-01-25 DIAGNOSIS — M25571 Pain in right ankle and joints of right foot: Secondary | ICD-10-CM | POA: Diagnosis not present

## 2020-01-25 DIAGNOSIS — M5126 Other intervertebral disc displacement, lumbar region: Secondary | ICD-10-CM | POA: Insufficient documentation

## 2020-01-29 ENCOUNTER — Other Ambulatory Visit: Payer: Medicare HMO

## 2020-02-05 ENCOUNTER — Institutional Professional Consult (permissible substitution): Payer: Medicare HMO | Admitting: Pulmonary Disease

## 2020-02-05 DIAGNOSIS — M25571 Pain in right ankle and joints of right foot: Secondary | ICD-10-CM | POA: Diagnosis not present

## 2020-02-05 DIAGNOSIS — S8261XD Displaced fracture of lateral malleolus of right fibula, subsequent encounter for closed fracture with routine healing: Secondary | ICD-10-CM | POA: Diagnosis not present

## 2020-02-20 ENCOUNTER — Telehealth: Payer: Self-pay | Admitting: Cardiovascular Disease

## 2020-02-20 MED ORDER — PANTOPRAZOLE SODIUM 40 MG PO TBEC: 40.0000 mg | DELAYED_RELEASE_TABLET | Freq: Two times a day (BID) | ORAL | 6 refills | Status: DC

## 2020-02-20 MED ORDER — ATORVASTATIN CALCIUM 80 MG PO TABS: 80.0000 mg | ORAL_TABLET | Freq: Every day | ORAL | 3 refills | Status: DC

## 2020-02-20 MED ORDER — METOPROLOL TARTRATE 25 MG PO TABS: 25.0000 mg | ORAL_TABLET | Freq: Two times a day (BID) | ORAL | 3 refills | Status: DC

## 2020-02-20 MED ORDER — CLOPIDOGREL BISULFATE 75 MG PO TABS: 75.0000 mg | ORAL_TABLET | Freq: Every day | ORAL | 3 refills | Status: DC

## 2020-02-20 NOTE — Telephone Encounter (Signed)
*  STAT* If patient is at the pharmacy, call can be transferred to refill team.   1. Which medications need to be refilled? (please list name of each medication and dose if known) Pantoprazole, Metoprolol, Atorvastatin and Clopidogrel  2. Which pharmacy/location (including street and city if local pharmacy) is medication to be sent to? [facility]  3. Do they need a 30 day or 90 day supply? 90 days and refills

## 2020-02-20 NOTE — Telephone Encounter (Signed)
Pt's medications were sent to pt's pharmacy as requested. Confirmation received.  

## 2020-02-23 ENCOUNTER — Encounter: Payer: Self-pay | Admitting: Internal Medicine

## 2020-03-18 ENCOUNTER — Ambulatory Visit (INDEPENDENT_AMBULATORY_CARE_PROVIDER_SITE_OTHER): Payer: Medicare HMO | Admitting: Internal Medicine

## 2020-03-18 ENCOUNTER — Encounter: Payer: Self-pay | Admitting: Internal Medicine

## 2020-03-18 ENCOUNTER — Other Ambulatory Visit: Payer: Self-pay

## 2020-03-18 VITALS — BP 148/78 | HR 59 | Temp 98.0°F | Resp 12 | Ht 71.0 in | Wt 235.6 lb

## 2020-03-18 DIAGNOSIS — M549 Dorsalgia, unspecified: Secondary | ICD-10-CM | POA: Diagnosis not present

## 2020-03-18 DIAGNOSIS — E1151 Type 2 diabetes mellitus with diabetic peripheral angiopathy without gangrene: Secondary | ICD-10-CM

## 2020-03-18 DIAGNOSIS — E785 Hyperlipidemia, unspecified: Secondary | ICD-10-CM | POA: Insufficient documentation

## 2020-03-18 DIAGNOSIS — R399 Unspecified symptoms and signs involving the genitourinary system: Secondary | ICD-10-CM

## 2020-03-18 DIAGNOSIS — E119 Type 2 diabetes mellitus without complications: Secondary | ICD-10-CM | POA: Diagnosis not present

## 2020-03-18 DIAGNOSIS — E1169 Type 2 diabetes mellitus with other specified complication: Secondary | ICD-10-CM | POA: Insufficient documentation

## 2020-03-18 LAB — URINALYSIS, ROUTINE W REFLEX MICROSCOPIC
Bilirubin Urine: NEGATIVE
Hgb urine dipstick: NEGATIVE
Leukocytes,Ua: NEGATIVE
Nitrite: NEGATIVE
RBC / HPF: NONE SEEN (ref 0–?)
Specific Gravity, Urine: 1.02 (ref 1.000–1.030)
Total Protein, Urine: NEGATIVE
Urine Glucose: NEGATIVE
Urobilinogen, UA: 1 (ref 0.0–1.0)
pH: 6 (ref 5.0–8.0)

## 2020-03-18 LAB — PSA: PSA: 0.41 ng/mL (ref 0.10–4.00)

## 2020-03-18 LAB — HEMOGLOBIN A1C: Hgb A1c MFr Bld: 6.5 % (ref 4.6–6.5)

## 2020-03-18 NOTE — Patient Instructions (Signed)
Continue checking your blood pressure BP GOAL is between 110/65 and  135/85. If it is consistently higher or lower, let me know  GO TO THE LAB : Get the blood work and provide a urine sample   GO TO [name], [name] back for a checkup in 2 months

## 2020-03-18 NOTE — Progress Notes (Signed)
Subjective:    Patient ID: [name]., male    DOB: August 17, 1960, 59 y.o.   MRN: [id number]  DOS:  03/18/2020 Type of visit - description: Multiple issues His main concern is urinary frequency, this is chronic but getting worse. He needs to urinate "every hour" sometimes with urgency and sometimes he is unable to reach the restroom. MSK: -Continue with chronic back pain. -Ankle. COPD: Not very symptomatic but still has some DOE.  Not taking any medications mostly due to cost.  Denies any frequent cough or wheezing.    Review of Systems Denies dysuria, gross hematuria or difficulty urinating.  Past Medical History:  Diagnosis Date   Abnormality of gait    Anxiety    CAD (coronary artery disease)    a. 10/2008 Inferolateral STEMI: 3VD w/ occluded LCX (PTCA)-->CABG x 3 (LIMA->LAD, VG->OM, VG->PDA);  b. 06/2009 NSTEMI/PCI: VG->OM 100 (BMS);  c. 05/2013 native 3VD, 3/3 patent grafts. d. 06/2016: Inferior STEMI w/ occluded SVG-OM (DES placed). LIMA-LAD and SVG-PDA patent.   Chronic bronchitis ([facility])    Chronic diastolic CHF (congestive heart failure) ([place])    a. 02/2015 Echo: EF 50-55%, distal septal, apical, inferobasal HK, mild LVH, mild MR, mildly dil LA.   Cocaine use    Colon polyps    a. 09/2015 colonscopy: multiple sessile polyps, path negative for high grade dysplasia.   Depression    Emphysema (subcutaneous) (surgical) resulting from a procedure 10/2008.   Bleb resected at time of 10/2008 CABG. Marketed emphysema noted on surgical report.   Esophagitis    a. 2017 EGD: esophagitis, duodenitis.   ETOH abuse    GERD (gastroesophageal reflux disease)    Hemianopia, homonymous, right    "can't see out the right side of either eye since stroke in 2016/pt description 09/23/2016   Hepatic steatosis    Hiatal hernia    History of nuclear stress test 08/2017   Nuclear stress test 1/19: EF 35, inf-lat and ant-lat, apical sept/apical scar; no ischemia; Intermediate  Risk   Hyperlipidemia    Hypertension    Iron deficiency anemia 2010   Morbid obesity ([place])    Myocardial infarction [facility]) "several"   PAD (peripheral artery disease) ([place])    Pancreatitis 06/2015   Sleep apnea    "don't have a mask" (09/23/2016)   Stroke ([place]) 02/2015   a. 02/2015 Carotid U/S: 1-39% bilat ICA stenosis; "left me blind" (09/23/2016)   Tobacco abuse    still smokes   Tubular adenoma of colon     Past Surgical History:  Procedure Laterality Date   ABDOMINAL AORTOGRAM W/LOWER EXTREMITY N/A 09/23/2016   Procedure: Abdominal Aortogram w/Lower Extremity;  Surgeon: Wellington Hampshire, MD;  Location: [place] [facility];  Service: Cardiovascular;  Laterality: N/A;   CARDIAC CATHETERIZATION  11/07/2008   EF of 50-55% -- normal LV systolic function, acute inferolateral ST elevation MI,  PTCA of the circumflex artery -- [name]. [name], [name]   CARDIAC CATHETERIZATION N/A 07/07/2016   Procedure: Left Heart Cath and Cors/Grafts Angiography;  Surgeon: [name], MD;  Location: [place] [facility];  Service: Cardiovascular;  Laterality: N/A;   CARDIAC CATHETERIZATION N/A 07/07/2016   Procedure: Coronary Stent Intervention;  Surgeon: [name], MD;  Location: [place] [facility];  Service: Cardiovascular;  Laterality: N/A;   CORONARY ANGIOPLASTY     CORONARY ARTERY BYPASS GRAFT     "CABG X4"   LEFT HEART CATH AND CORS/GRAFTS ANGIOGRAPHY N/A 10/01/2017  Procedure: LEFT HEART CATH AND CORS/GRAFTS ANGIOGRAPHY;  Surgeon: Burnell Blanks, MD;  Location: [place] [facility];  Service: Cardiovascular;  Laterality: N/A;   LEFT HEART CATHETERIZATION WITH CORONARY/GRAFT ANGIOGRAM N/A 05/15/2013   Procedure: LEFT HEART CATHETERIZATION WITH Beatrix Fetters;  Surgeon: [name], MD;  Location: [facility] CATH LAB;  Service: Cardiovascular;  Laterality: N/A;   PERIPHERAL VASCULAR INTERVENTION  09/23/2016   Procedure: Peripheral Vascular Intervention;  Surgeon:  Wellington Hampshire, MD;  Location: [place] [facility];  Service: Cardiovascular;;  Lt. SFA    Allergies as of 03/18/2020   No Known Allergies     Medication List       Accurate as of March 18, 2020  1:37 PM. If you have any questions, ask your nurse or doctor.        aspirin 81 MG EC tablet Take 1 tablet (81 mg total) by mouth daily with breakfast.   atorvastatin 80 MG tablet Commonly known as: LIPITOR Take 1 tablet (80 mg total) by mouth daily at 6 PM.   augmented betamethasone dipropionate 0.05 % cream Commonly known as: DIPROLENE-AF Apply topically 2 (two) times daily.   clopidogrel 75 MG tablet Commonly known as: PLAVIX Take 1 tablet (75 mg total) by mouth daily.   fenofibrate 145 MG tablet Commonly known as: Tricor Take 1 tablet (145 mg total) by mouth daily.   folic acid 1 MG tablet Commonly known as: FOLVITE Take 1 tablet (1 mg total) by mouth daily.   isosorbide mononitrate 60 MG 24 hr tablet Commonly known as: IMDUR TAKE 1 TABLET DAILY. PLEASE KEEP UPCOMING APPT IN JANUARY BEFORE ANYMORE REFILLS   lisinopril 10 MG tablet Commonly known as: ZESTRIL Take 1 tablet (10 mg total) by mouth daily.   metoprolol tartrate 25 MG tablet Commonly known as: LOPRESSOR Take 1 tablet (25 mg total) by mouth 2 (two) times daily.   multivitamin with minerals Tabs tablet Take 1 tablet by mouth daily.   nitroGLYCERIN 0.4 MG SL tablet Commonly known as: Nitrostat Place 1 tablet (0.4 mg total) under the tongue every 5 (five) minutes as needed. PLACE 1 TABLET (0.4 MG TOTAL) UNDER THE TONGUE EVERY 5 MINUTES AS NEEDED FOR CHEST PAIN.   pantoprazole 40 MG tablet Commonly known as: PROTONIX Take 1 tablet (40 mg total) by mouth 2 (two) times daily.   Repatha SureClick 102 MG/ML Soaj Generic drug: Evolocumab Inject 1 pen into the skin every 14 (fourteen) days.   Spiriva Respimat 1.25 MCG/ACT Aers Generic drug: Tiotropium Bromide Monohydrate Inhale 2 puffs into the lungs  daily.   thiamine 100 MG tablet Take 1 tablet (100 mg total) by mouth daily.   traZODone 50 MG tablet Commonly known as: DESYREL Take 1 tablet (50 mg total) by mouth at bedtime.   Tudorza Pressair 400 MCG/ACT Aepb Generic drug: Aclidinium Bromide Inhale 1 puff into the lungs daily.          Objective:   Physical Exam BP (!) 148/78 (BP Location: Right Arm, Cuff Size: Normal)    Pulse (!) 59    Temp 98 F (36.7 C)    Resp 12    Ht 5\' 11"  (1.803 m)    Wt 235 lb 9.6 oz (106.9 kg)    SpO2 94%    BMI 32.86 kg/m  General:   Well developed, NAD, BMI noted.  HEENT:  Normocephalic . Face symmetric, atraumatic Lungs:  Decreased breath sounds Normal respiratory effort, no intercostal retractions, no accessory muscle use.  Heart: RRR,  no murmur.  Abdomen:  Not distended, soft, non-tender. No rebound or rigidity. DRE: Brown stools, prostate is soft, normal size.  Not tender Skin: Not pale. Not jaundice Lower extremities: no pretibial edema bilaterally  Neurologic:  alert & oriented X3.  Speech normal, gait appropriate for age and unassisted Psych--  Cognition and judgment appear intact.  Cooperative with normal attention span and concentration.  Behavior appropriate. No anxious or depressed appearing.     Assessment      Assessment   Prediabetes  HTN Dyslipidemia CV: --CAD: 2010 angioplasty f/u by  CABG 2010, cath 2014 -- CP, cath 09/2017, patent grafts, medical treatment --PVD --Stroke 2008, 02-2015, 2020 Anxiety depression  OSA: Sleep study 01/2017, significant sleep apnea noted Abnormal gait GI: Iron def anemia dx 03-2015  ---cscope 09-2015 multiple polyps, repeat in one year ---EGD-2017: Esophagitis, duodenitis, bx done Pancreatitis 06/2015.(EtOH 2 days prior to onset of symptoms)  etoh abuse H/o cocaine  Smoker  +FH: father prostate ca , dx late 60s Back pain:saw Ortho 01/2020, SXS felt to be mechanical due to: JD, deconditioning, residual weakness from the  stroke.  PVD likely to be contributing.   PLAN DM: Last A1c sas 6.7, DX change from hyperglycemia to diabetes.  Patient aware.  Diet  encouraged.  Check A1c. HTN: BP slightly elevated today, at home reports he gets normal readings.  Last BMP was satisfactory, continue lisinopril, metoprolol. CAD: Saw cardiology Jan 01, 2020.  Felt to be stable. Closed FX of the R malleolus.  Follow-up by Ortho Back pain: Last visit he complained of right leg pain, subsequently saw Ortho 01/2020, at that time he complained of back pain with radiation to both legs posteriorly. SXS felt to be mechanical due to: DJD, deconditioning, residual weakness from the stroke.  PVD likely to be contributing. He felt that a local injection might help but he was concerned about stopping the Plavix. PT was option to. Surgical intervention was deemed to be unacceptable due to risk of recurrent stroke or a cardiac event. Was offered a referral to see Dr. Nelva Bush for pain management At this point, symptoms are about the same, he made clear he does not like pain medication but request a parking permit which I signed (6 months) LUTS: Complain of urinary frequency, check UA, urine culture, PSA.  DRE was normal.  Further advised with results. COPD: See last visit, did not pursue pulmonary referral, PFTs.  He did get an inhaler, use it temporarily, did not help.  Currently not using an inhaler and rarely has cough/wheezing. EtOH: Reports drinks a beer or 2 twice a week RTC 3 months   This visit occurred during the SARS-CoV-2 public health emergency.  Safety protocols were in place, including screening questions prior to the visit, additional usage of staff PPE, and extensive cleaning of exam room while observing appropriate contact time as indicated for disinfecting solutions.

## 2020-03-18 NOTE — Assessment & Plan Note (Signed)
DM: Last A1c sas 6.7, DX change from hyperglycemia to diabetes.  Patient aware.  Diet  encouraged.  Check A1c. HTN: BP slightly elevated today, at home reports he gets normal readings.  Last BMP was satisfactory, continue lisinopril, metoprolol. CAD: Saw cardiology Jan 01, 2020.  Felt to be stable. Closed FX of the R malleolus.  Follow-up by Ortho Back pain: Last visit he complained of right leg pain, subsequently saw Ortho 01/2020, at that time he complained of back pain with radiation to both legs posteriorly. SXS felt to be mechanical due to: DJD, deconditioning, residual weakness from the stroke.  PVD likely to be contributing. He felt that a local injection might help but he was concerned about stopping the Plavix. PT was option to. Surgical intervention was deemed to be unacceptable due to risk of recurrent stroke or a cardiac event. Was offered a referral to see Dr. Nelva Bush for pain management At this point, symptoms are about the same, he made clear he does not like pain medication but request a parking permit which I signed (6 months) LUTS: Complain of urinary frequency, check UA, urine culture, PSA.  DRE was normal.  Further advised with results. COPD: See last visit, did not pursue pulmonary referral, PFTs.  He did get an inhaler, use it temporarily, did not help.  Currently not using an inhaler and rarely has cough/wheezing. EtOH: Reports drinks a beer or 2 twice a week RTC 3 months

## 2020-03-19 LAB — URINE CULTURE
MICRO NUMBER:: 10803007
SPECIMEN QUALITY:: ADEQUATE

## 2020-03-22 NOTE — Addendum Note (Signed)
Addended by: Jeronimo Greaves on: 03/22/2020 08:43 AM   Modules accepted: Orders

## 2020-04-04 DIAGNOSIS — R351 Nocturia: Secondary | ICD-10-CM | POA: Diagnosis not present

## 2020-04-04 DIAGNOSIS — N401 Enlarged prostate with lower urinary tract symptoms: Secondary | ICD-10-CM | POA: Diagnosis not present

## 2020-04-04 DIAGNOSIS — R3911 Hesitancy of micturition: Secondary | ICD-10-CM | POA: Diagnosis not present

## 2020-04-04 DIAGNOSIS — R350 Frequency of micturition: Secondary | ICD-10-CM | POA: Diagnosis not present

## 2020-04-11 DIAGNOSIS — F17210 Nicotine dependence, cigarettes, uncomplicated: Secondary | ICD-10-CM | POA: Diagnosis not present

## 2020-04-11 DIAGNOSIS — J069 Acute upper respiratory infection, unspecified: Secondary | ICD-10-CM | POA: Diagnosis not present

## 2020-04-11 DIAGNOSIS — F1020 Alcohol dependence, uncomplicated: Secondary | ICD-10-CM | POA: Diagnosis not present

## 2020-04-11 DIAGNOSIS — R9431 Abnormal electrocardiogram [ECG] [EKG]: Secondary | ICD-10-CM | POA: Diagnosis not present

## 2020-04-11 DIAGNOSIS — Z20822 Contact with and (suspected) exposure to covid-19: Secondary | ICD-10-CM | POA: Diagnosis not present

## 2020-04-11 DIAGNOSIS — R748 Abnormal levels of other serum enzymes: Secondary | ICD-10-CM | POA: Diagnosis not present

## 2020-04-11 DIAGNOSIS — H539 Unspecified visual disturbance: Secondary | ICD-10-CM | POA: Diagnosis not present

## 2020-04-11 DIAGNOSIS — R531 Weakness: Secondary | ICD-10-CM | POA: Diagnosis not present

## 2020-04-11 DIAGNOSIS — I6782 Cerebral ischemia: Secondary | ICD-10-CM | POA: Diagnosis not present

## 2020-04-11 DIAGNOSIS — R2990 Unspecified symptoms and signs involving the nervous system: Secondary | ICD-10-CM | POA: Diagnosis not present

## 2020-04-12 ENCOUNTER — Telehealth: Payer: Self-pay | Admitting: Cardiovascular Disease

## 2020-04-12 DIAGNOSIS — R9431 Abnormal electrocardiogram [ECG] [EKG]: Secondary | ICD-10-CM | POA: Diagnosis not present

## 2020-04-12 DIAGNOSIS — J069 Acute upper respiratory infection, unspecified: Secondary | ICD-10-CM | POA: Diagnosis not present

## 2020-04-12 DIAGNOSIS — R748 Abnormal levels of other serum enzymes: Secondary | ICD-10-CM | POA: Diagnosis not present

## 2020-04-12 DIAGNOSIS — Z20822 Contact with and (suspected) exposure to covid-19: Secondary | ICD-10-CM | POA: Diagnosis not present

## 2020-04-12 NOTE — Telephone Encounter (Signed)
Patient's daughter is calling to follow up. She states they are leaving town later this afternoon and she would like a call back before they leave if possible.

## 2020-04-12 NOTE — Telephone Encounter (Signed)
Patient states he went to [facility] last night because he thought he was having a stroke. They ran blood tests and told him he could go home. Patient left but states the hospital tried calling him to come back due to his lab results. Patient has not gone back to the hospital. He is unsure of what to do now, he wanted to advise Dr. Angelena Form on what was going on but is not sure if he needs an appointment. Please advise.

## 2020-04-12 NOTE — Telephone Encounter (Signed)
Agree that he needs to return for evaluation if his labs were abnormal and he was not feeling well. Gerald Stabs

## 2020-04-12 NOTE — Telephone Encounter (Signed)
Spoke with patient and his daughter. He went to [place] yesterday.  Left after initial work up.  Troponin was 18.  They tried to reach him to come back. He called them back but did not return.  He has cough, head congestion, scratchy throat. Has been vaccinated for Covid.  Was not Covid swabbed last night.  No SOB or chest pain.  He is planning to go to the beach tomorrow morning for a week if he can. Doesn't feel great.    I adv him to return to [facility] today for further evaluation and that I would send message to Dr. Angelena Form letting him know.

## 2020-04-15 DIAGNOSIS — R9431 Abnormal electrocardiogram [ECG] [EKG]: Secondary | ICD-10-CM | POA: Diagnosis not present

## 2020-04-22 ENCOUNTER — Other Ambulatory Visit: Payer: Self-pay

## 2020-04-22 ENCOUNTER — Encounter: Payer: Self-pay | Admitting: Cardiovascular Disease

## 2020-04-22 ENCOUNTER — Ambulatory Visit (INDEPENDENT_AMBULATORY_CARE_PROVIDER_SITE_OTHER): Payer: Medicare HMO | Admitting: Cardiovascular Disease

## 2020-04-22 ENCOUNTER — Telehealth: Payer: Self-pay | Admitting: Cardiovascular Disease

## 2020-04-22 VITALS — BP 142/68 | HR 65 | Ht 71.0 in | Wt 230.4 lb

## 2020-04-22 DIAGNOSIS — I10 Essential (primary) hypertension: Secondary | ICD-10-CM | POA: Diagnosis not present

## 2020-04-22 DIAGNOSIS — I25810 Atherosclerosis of coronary artery bypass graft(s) without angina pectoris: Secondary | ICD-10-CM

## 2020-04-22 DIAGNOSIS — I739 Peripheral vascular disease, unspecified: Secondary | ICD-10-CM

## 2020-04-22 NOTE — Patient Instructions (Signed)
Medication Instructions:  Your provider recommends that you continue on your current medications as directed. Please refer to the Current Medication list given to you today.   *If you need a refill on your cardiac medications before your next appointment, please call your pharmacy*  Follow-Up: Your provider recommends that you schedule a follow-up appointment in 6 MONTHS.

## 2020-04-22 NOTE — Telephone Encounter (Signed)
Her brother may forget to tell Dr. Angelena Form but she wanted to say - he has had a cracked tooth for about 2-3 weeks and has been putting cloves on/in it.  She thinks this could cause the troponin to have been high.    Also, his head CT at ER on 9/3 was negative for stroke but he might have had a concussion, because he blacked out the week before and probably hit his head--he had a raised area on his head.

## 2020-04-22 NOTE — Progress Notes (Signed)
Chief Complaint  Patient presents with  . Follow-up    CAD   History of Present Illness:  59 yo male with history of HTN, hyperlipidemia, tobacco abuse, former cocaine abuse, PAD and CAD here today for cardiac follow up. He presented with an inferolateral STEMI in March 2010 at which time his Circumflex was occluded and was opened with a balloon. He underwent emergent 3V CABG (LIMA to LAD, SVG to Circumflex, SVG to PDA) given his diffuse multivessel disease. In November 2010 he presented with a NSTEMI and had bare metal stenting of the totally occluded saphenous vein graft to the obtuse marginal artery. Cardiac cath October 2014 with 3 patent bypass grafts. Admitted July 2016 to [facility] with CVA. Echo 02/21/15 with normal LV function. Carotid dopplers July 2016 with mild bilateral carotid disease. Admitted to [facility] 07/07/16 with lateral STEMI secondary to acute occlusion of the SVG to the OM. A Synergy DES was placed in the body of the SVG to the OM. Normal LV function by LV gram. He had dyspnea with Brilinta and was discharged on ASA and Plavix. He was referred to DR. Arida for PV workup. Lower ext angiogram 09/23/16 with occluded left SFA which was treated with atherectomy, drug coated balloon angioplasty and stenting. Moderate disease in right SFA. He was seen by Dr. Fletcher Anon in the [facility] clinic in May 2021 and his LE disease was felt to be stable. Cardiac cath 10/01/17 with occlusion of all native vessels with 3 patent bypass grafts. He had an acute ischemic CVA in April 2020. Echo April 2020 with LVEF=60-65%. He refused rehab and signed out AMA. He was seen by telemedicine visit march 2021 and was stable. I saw him in the office in May 2021 and he c/o ongoing dyspnea, leg pain, back pain. He is still smoking. He went into the ED at [facility] 04/12/20 with c/o weakness. He thought he was having a stroke. CT head with no acute abnormalities. BMET and CBC were unremarkable. Troponin was 18 but he did not wish  to go back to the ED as he had a beach trip planned.   He is here today for follow up. The patient denies any chest pain, dyspnea, palpitations, lower extremity edema, orthopnea, PND, dizziness, near syncope or syncope. He c/o sinus congestion today with post nasal drip.   Primary Care Physician: Colon Branch, MD   Past Medical History:  Diagnosis Date  . Abnormality of gait   . Anxiety   . CAD (coronary artery disease)    a. 10/2008 Inferolateral STEMI: 3VD w/ occluded LCX (PTCA)-->CABG x 3 (LIMA->LAD, VG->OM, VG->PDA);  b. 06/2009 NSTEMI/PCI: VG->OM 100 (BMS);  c. 05/2013 native 3VD, 3/3 patent grafts. d. 06/2016: Inferior STEMI w/ occluded SVG-OM (DES placed). LIMA-LAD and SVG-PDA patent.  . Chronic bronchitis ([place])   . Chronic diastolic CHF (congestive heart failure) ([place])    a. 02/2015 Echo: EF 50-55%, distal septal, apical, inferobasal HK, mild LVH, mild MR, mildly dil LA.  [name] Cocaine use   . Colon polyps    a. 09/2015 colonscopy: multiple sessile polyps, path negative for high grade dysplasia.  . Depression   . Emphysema (subcutaneous) (surgical) resulting from a procedure 10/2008.   Bleb resected at time of 10/2008 CABG. Marketed emphysema noted on surgical report.  . Esophagitis    a. 2017 EGD: esophagitis, duodenitis.  [name] ETOH abuse   . GERD (gastroesophageal reflux disease)   . Hemianopia, homonymous, right    "can't see  out the right side of either eye since stroke in 2016/pt description 09/23/2016  . Hepatic steatosis   . Hiatal hernia   . History of nuclear stress test 08/2017   Nuclear stress test 1/19: EF 35, inf-lat and ant-lat, apical sept/apical scar; no ischemia; Intermediate Risk  . Hyperlipidemia   . Hypertension   . Iron deficiency anemia 2010  . Morbid obesity ([place])   . Myocardial infarction ([place]) "several"  . PAD (peripheral artery disease) ([place])   . Pancreatitis 06/2015  . Sleep apnea    "don't have a mask" (09/23/2016)  . Stroke ([place]) 02/2015   a. 02/2015  Carotid U/S: 1-39% bilat ICA stenosis; "left me blind" (09/23/2016)  . Tobacco abuse    still smokes  . Tubular adenoma of colon     Past Surgical History:  Procedure Laterality Date  . ABDOMINAL AORTOGRAM W/LOWER EXTREMITY N/A 09/23/2016   Procedure: Abdominal Aortogram w/Lower Extremity;  Surgeon: Wellington Hampshire, MD;  Location: [place] [facility];  Service: Cardiovascular;  Laterality: N/A;  . CARDIAC CATHETERIZATION  11/07/2008   EF of 50-55% -- normal LV systolic function, acute inferolateral ST elevation MI,  PTCA of the circumflex artery -- [name]. [name], [name]  . CARDIAC CATHETERIZATION N/A 07/07/2016   Procedure: Left Heart Cath and Cors/Grafts Angiography;  Surgeon: [name], MD;  Location: [place] [facility];  Service: Cardiovascular;  Laterality: N/A;  . CARDIAC CATHETERIZATION N/A 07/07/2016   Procedure: Coronary Stent Intervention;  Surgeon: [name], MD;  Location: [place] [facility];  Service: Cardiovascular;  Laterality: N/A;  . CORONARY ANGIOPLASTY    . CORONARY ARTERY BYPASS GRAFT     "CABG X4"  . LEFT HEART CATH AND CORS/GRAFTS ANGIOGRAPHY N/A 10/01/2017   Procedure: LEFT HEART CATH AND CORS/GRAFTS ANGIOGRAPHY;  Surgeon: Burnell Blanks, MD;  Location: [place] [facility];  Service: Cardiovascular;  Laterality: N/A;  . LEFT HEART CATHETERIZATION WITH CORONARY/GRAFT ANGIOGRAM N/A 05/15/2013   Procedure: LEFT HEART CATHETERIZATION WITH Beatrix Fetters;  Surgeon: [name], MD;  Location: [facility] C[facility] LAB;  Service: Cardiovascular;  Laterality: N/A;  . PERIPHERAL VASCULAR INTERVENTION  09/23/2016   Procedure: Peripheral Vascular Intervention;  Surgeon: Wellington Hampshire, MD;  Location: [place] [facility];  Service: Cardiovascular;;  Lt. SFA    Current Outpatient Medications  Medication Sig Dispense Refill  . aspirin EC 81 MG EC tablet Take 1 tablet (81 mg total) by mouth daily with breakfast. 30 tablet 2  . atorvastatin (LIPITOR) 80 MG tablet  Take 1 tablet (80 mg total) by mouth daily at 6 PM. 90 tablet 3  . augmented betamethasone dipropionate (DIPROLENE-AF) 0.05 % cream Apply topically 2 (two) times daily. 60 g 0  . clopidogrel (PLAVIX) 75 MG tablet Take 1 tablet (75 mg total) by mouth daily. 90 tablet 3  . Evolocumab (REPATHA SURECLICK) 161 MG/ML SOAJ Inject 1 pen into the skin every 14 (fourteen) days. 2 pen 11  . fenofibrate (TRICOR) 145 MG tablet Take 1 tablet (145 mg total) by mouth daily. 90 tablet 3  . folic acid (FOLVITE) 1 MG tablet Take 1 tablet (1 mg total) by mouth daily. 90 tablet 3  . isosorbide mononitrate (IMDUR) 60 MG 24 hr tablet TAKE 1 TABLET DAILY. PLEASE KEEP UPCOMING APPT IN JANUARY BEFORE ANYMORE REFILLS 90 tablet 3  . lisinopril (ZESTRIL) 10 MG tablet Take 1 tablet (10 mg total) by mouth daily. 90 tablet 3  . metoprolol tartrate (LOPRESSOR) 25 MG  tablet Take 1 tablet (25 mg total) by mouth 2 (two) times daily. 180 tablet 3  . Multiple Vitamin (MULTIVITAMIN WITH MINERALS) TABS tablet Take 1 tablet by mouth daily. 90 tablet 2  . nitroGLYCERIN (NITROSTAT) 0.4 MG SL tablet Place 1 tablet (0.4 mg total) under the tongue every 5 (five) minutes as needed. PLACE 1 TABLET (0.4 MG TOTAL) UNDER THE TONGUE EVERY 5 MINUTES AS NEEDED FOR CHEST PAIN. 30 tablet 2  . pantoprazole (PROTONIX) 40 MG tablet Take 1 tablet (40 mg total) by mouth 2 (two) times daily. 60 tablet 6  . thiamine 100 MG tablet Take 1 tablet (100 mg total) by mouth daily. 90 tablet 3  . traZODone (DESYREL) 50 MG tablet Take 1 tablet (50 mg total) by mouth at bedtime. 90 tablet 3   No current facility-administered medications for this visit.    No Known Allergies  Social History   Socioeconomic History  . Marital status: Single    Spouse name: Not on file  . Number of children: 2  . Years of education: Not on file  . Highest education level: Not on file  Occupational History  . Occupation: not working , used to be a Chief Executive Officer   .  Occupation: disable  Tobacco Use  . Smoking status: Current Every Day Smoker    Packs/day: 1.00    Years: 43.00    Pack years: 43.00    Types: Cigarettes  . Smokeless tobacco: Never Used  Vaping Use  . Vaping Use: Never used  Substance and Sexual Activity  . Alcohol use: Yes    Alcohol/week: 14.0 standard drinks    Types: 14 Cans of beer per week    Comment: as off 12/2017: beer 12-pack q week  . Drug use: No    Comment: 09/23/2016 "none in years"  . Sexual activity: Not Currently  Other Topics Concern  . Not on file  Social History Narrative   Lives w/ girlfriend    Social Determinants of Health   Financial Resource Strain:   . Difficulty of Paying Living Expenses: Not on file  Food Insecurity:   . Worried About Charity fundraiser in the Last Year: Not on file  . Ran Out of Food in the Last Year: Not on file  Transportation Needs:   . Lack of Transportation (Medical): Not on file  . Lack of Transportation (Non-Medical): Not on file  Physical Activity:   . Days of Exercise per Week: Not on file  . Minutes of Exercise per Session: Not on file  Stress:   . Feeling of Stress : Not on file  Social Connections:   . Frequency of Communication with Friends and Family: Not on file  . Frequency of Social Gatherings with Friends and Family: Not on file  . Attends Religious Services: Not on file  . Active Member of Clubs or Organizations: Not on file  . Attends Archivist Meetings: Not on file  . Marital Status: Not on file  Intimate Partner Violence:   . Fear of Current or Ex-Partner: Not on file  . Emotionally Abused: Not on file  . Physically Abused: Not on file  . Sexually Abused: Not on file    Family History  Problem Relation Age of Onset  . Heart disease Mother   . Heart failure Mother   . Diabetes Mother   . Colon cancer Father        late 75s  . Prostate cancer Neg Hx  Review of Systems:  As stated in the HPI and otherwise negative.   BP (!)  142/68   Pulse 65   Ht 5\' 11"  (1.803 m)   Wt 230 lb 6.4 oz (104.5 kg)   SpO2 93%   BMI 32.13 kg/m   Physical Examination:  General: Well developed, well nourished, NAD  HEENT: OP clear, mucus membranes moist  SKIN: warm, dry. No rashes. Neuro: No focal deficits  Musculoskeletal: Muscle strength 5/5 all ext  Psychiatric: Mood and affect normal  Neck: No JVD, no carotid bruits, no thyromegaly, no lymphadenopathy.  Lungs:Clear bilaterally, no wheezes, rhonci, crackles. He has audible sounds when breathing that are coming from his upper airway.  Cardiovascular: Regular rate and rhythm. No murmurs, gallops or rubs. Abdomen:Soft. Bowel sounds present. Non-tender.  Extremities: No lower extremity edema. Pulses are 2 + in the bilateral DP/PT.  Echo 02/21/15: Procedure narrative: Transthoracic echocardiography. Image quality was adequate. The study was technically difficult due to variations in respirations. - Left ventricle: Distal septal, apical and inferobasal hypokinesis The cavity size was normal. Wall thickness was increased in a pattern of mild LVH. Systolic function was normal. The estimated ejection fraction was in the range of 50% to 55%. - Mitral valve: There was mild regurgitation. - Left atrium: The atrium was mildly dilated. - Atrial septum: No defect or patent foramen ovale was identified.  EKG:  EKG is ordered today. The ekg ordered today demonstrates sinus, Chronic inferior ST elevation. No change since last EKG  Recent Labs: 11/14/2019: BUN 12; Creatinine, Ser 0.90; Potassium 4.9; Sodium 133 12/27/2019: Hemoglobin 15.5; Platelets 221.0   Lipid Panel    Component Value Date/Time   CHOL 171 11/14/2019 1441   TRIG 97 11/14/2019 1441   HDL 37 (L) 11/14/2019 1441   CHOLHDL 4.6 11/14/2019 1441   CHOLHDL 4.3 11/27/2018 0441   VLDL 35 11/27/2018 0441   LDLCALC 116 (H) 11/14/2019 1441   LDLDIRECT 116.0 05/12/2013 1517     Wt Readings from Last 3 Encounters:   04/22/20 230 lb 6.4 oz (104.5 kg)  03/18/20 235 lb 9.6 oz (106.9 kg)  01/01/20 237 lb (107.5 kg)     Other studies Reviewed: Additional studies/ records that were reviewed today include: . Review of the above records demonstrates:    Assessment and Plan:   1. CAD without angina: No chest pain. He is s/p 3V CABG in March 2010 and has had bare metal stent to SVG to Circumflex in November 2010 as well as DES placed in the body of the SVG to OM in November 2017 in the setting of lateral STEMI. LV function is normal by LV gram 2020. Will continue ASA, statin, Plavix and beta blocker. I would not pursue an ischemic workup given the very subtle troponin elevation in [place] 2 weeks ago.   2. HTN: BP is well controlled. No changes  3. PAD: Followed in [place] clinic by Dr. Fletcher Anon.   Current medicines are reviewed at length with the patient today.  The patient does not have concerns regarding medicines.  The following changes have been made:  no change  Labs/ tests ordered today include:   Orders Placed This Encounter  Procedures  . EKG 12-Lead    Disposition:   FU with me in 12  months  Signed, [name], MD 04/22/2020 4:20 PM    [facility] [address], [place], [place]  [address] Phone: [phone]; Fax: [phone]

## 2020-04-22 NOTE — Telephone Encounter (Signed)
New message:    Patient sister calling and would like for a nurse to call back concering his apt.

## 2020-05-20 ENCOUNTER — Ambulatory Visit: Payer: Medicare HMO | Admitting: Internal Medicine

## 2020-05-20 ENCOUNTER — Encounter: Payer: Self-pay | Admitting: Internal Medicine

## 2020-05-29 ENCOUNTER — Telehealth: Payer: Self-pay | Admitting: Internal Medicine

## 2020-05-29 MED ORDER — NITROGLYCERIN 0.4 MG SL SUBL: 0.4000 mg | SUBLINGUAL_TABLET | SUBLINGUAL | 2 refills | Status: DC | PRN

## 2020-05-29 NOTE — Telephone Encounter (Signed)
Rx sent 

## 2020-05-29 NOTE — Addendum Note (Signed)
Addended byDamita Dunnings D on: 05/29/2020 01:23 PM   Modules accepted: Orders

## 2020-05-29 NOTE — Telephone Encounter (Signed)
Caller : Boyce Call Back  # [phone]  Patient states that he washed his medication in a pair of his pants . Patient is requesting that we send another prescription in to pharmacy.   nitroGLYCERIN (NITROSTAT) 0.4 MG SL tablet [[id number]]

## 2020-05-31 DIAGNOSIS — N401 Enlarged prostate with lower urinary tract symptoms: Secondary | ICD-10-CM | POA: Diagnosis not present

## 2020-05-31 DIAGNOSIS — R351 Nocturia: Secondary | ICD-10-CM | POA: Diagnosis not present

## 2020-05-31 DIAGNOSIS — R350 Frequency of micturition: Secondary | ICD-10-CM | POA: Diagnosis not present

## 2020-07-10 ENCOUNTER — Other Ambulatory Visit: Payer: Self-pay

## 2020-07-10 ENCOUNTER — Encounter: Payer: Self-pay | Admitting: Internal Medicine

## 2020-07-10 ENCOUNTER — Ambulatory Visit (INDEPENDENT_AMBULATORY_CARE_PROVIDER_SITE_OTHER): Payer: Medicare HMO | Admitting: Internal Medicine

## 2020-07-10 VITALS — BP 155/82 | HR 71 | Temp 97.8°F | Ht 71.0 in | Wt 234.4 lb

## 2020-07-10 DIAGNOSIS — I10 Essential (primary) hypertension: Secondary | ICD-10-CM

## 2020-07-10 DIAGNOSIS — E119 Type 2 diabetes mellitus without complications: Secondary | ICD-10-CM | POA: Diagnosis not present

## 2020-07-10 DIAGNOSIS — R490 Dysphonia: Secondary | ICD-10-CM

## 2020-07-10 DIAGNOSIS — R1310 Dysphagia, unspecified: Secondary | ICD-10-CM

## 2020-07-10 DIAGNOSIS — J449 Chronic obstructive pulmonary disease, unspecified: Secondary | ICD-10-CM | POA: Diagnosis not present

## 2020-07-10 DIAGNOSIS — R739 Hyperglycemia, unspecified: Secondary | ICD-10-CM

## 2020-07-10 DIAGNOSIS — E1151 Type 2 diabetes mellitus with diabetic peripheral angiopathy without gangrene: Secondary | ICD-10-CM

## 2020-07-10 DIAGNOSIS — G4733 Obstructive sleep apnea (adult) (pediatric): Secondary | ICD-10-CM

## 2020-07-10 MED ORDER — ALBUTEROL SULFATE HFA 108 (90 BASE) MCG/ACT IN AERS: 2.0000 | INHALATION_SPRAY | Freq: Four times a day (QID) | RESPIRATORY_TRACT | 1 refills | Status: DC

## 2020-07-10 MED ORDER — BUPROPION HCL 100 MG PO TABS: ORAL_TABLET | ORAL | 3 refills | Status: DC

## 2020-07-10 NOTE — Patient Instructions (Signed)
Check the  blood pressure   BP GOAL is between 110/65 and  135/85. If it is consistently higher or lower, let me know  We are sending albuterol, you can use 2 puffs every 6 hours as needed if you notice chest congestion or wheezing  We are referring you to ENT  We are referring you to have a  study to evaluate your swallowing  I do recommend you to use your CPAP every night  GO TO THE LAB : Get the blood work      GO TO THE FRONT DESK, [place] back for a checkup in 3 months

## 2020-07-10 NOTE — Progress Notes (Signed)
Subjective:    Patient ID: [name]., male    DOB: 03/04/1961, 59 y.o.   MRN: [id number]  DOS:  07/10/2020 Type of visit - description: Acute The patient reports that since the summer his voice has definitely changed and is hoarse. Also since the summer he has difficulty swallowing, both liquids and solids. Admits that occasionally cough when eating.   Review of Systems Denies fever chills or sore throat. No weight loss DOE at baseline. Cough sometimes. Had GERD symptoms, they are very well controlled on PPIs No nausea, vomiting. No actual odynophagia.   Past Medical History:  Diagnosis Date  . Abnormality of gait   . Anxiety   . CAD (coronary artery disease)    a. 10/2008 Inferolateral STEMI: 3VD w/ occluded LCX (PTCA)-->CABG x 3 (LIMA->LAD, VG->OM, VG->PDA);  b. 06/2009 NSTEMI/PCI: VG->OM 100 (BMS);  c. 05/2013 native 3VD, 3/3 patent grafts. d. 06/2016: Inferior STEMI w/ occluded SVG-OM (DES placed). LIMA-LAD and SVG-PDA patent.  . Chronic bronchitis ([place])   . Chronic diastolic CHF (congestive heart failure) ([place])    a. 02/2015 Echo: EF 50-55%, distal septal, apical, inferobasal HK, mild LVH, mild MR, mildly dil LA.  [name] Cocaine use   . Colon polyps    a. 09/2015 colonscopy: multiple sessile polyps, path negative for high grade dysplasia.  . Depression   . Emphysema (subcutaneous) (surgical) resulting from a procedure 10/2008.   Bleb resected at time of 10/2008 CABG. Marketed emphysema noted on surgical report.  . Esophagitis    a. 2017 EGD: esophagitis, duodenitis.  [name] ETOH abuse   . GERD (gastroesophageal reflux disease)   . Hemianopia, homonymous, right    "can't see out the right side of either eye since stroke in 2016/pt description 09/23/2016  . Hepatic steatosis   . Hiatal hernia   . History of nuclear stress test 08/2017   Nuclear stress test 1/19: EF 35, inf-lat and ant-lat, apical sept/apical scar; no ischemia; Intermediate Risk  . Hyperlipidemia   .  Hypertension   . Iron deficiency anemia 2010  . Morbid obesity ([place])   . Myocardial infarction ([place]) "several"  . PAD (peripheral artery disease) ([place])   . Pancreatitis 06/2015  . Sleep apnea    "don't have a mask" (09/23/2016)  . Stroke ([place]) 02/2015   a. 02/2015 Carotid U/S: 1-39% bilat ICA stenosis; "left me blind" (09/23/2016)  . Tobacco abuse    still smokes  . Tubular adenoma of colon     Past Surgical History:  Procedure Laterality Date  . ABDOMINAL AORTOGRAM W/LOWER EXTREMITY N/A 09/23/2016   Procedure: Abdominal Aortogram w/Lower Extremity;  Surgeon: Wellington Hampshire, MD;  Location: [place] [facility];  Service: Cardiovascular;  Laterality: N/A;  . CARDIAC CATHETERIZATION  11/07/2008   EF of 50-55% -- normal LV systolic function, acute inferolateral ST elevation MI,  PTCA of the circumflex artery -- [name]. [name], [name]  . CARDIAC CATHETERIZATION N/A 07/07/2016   Procedure: Left Heart Cath and Cors/Grafts Angiography;  Surgeon: [name], MD;  Location: [place] [facility];  Service: Cardiovascular;  Laterality: N/A;  . CARDIAC CATHETERIZATION N/A 07/07/2016   Procedure: Coronary Stent Intervention;  Surgeon: [name], MD;  Location: [facility] [facility];  Service: Cardiovascular;  Laterality: N/A;  . CORONARY ANGIOPLASTY    . CORONARY ARTERY BYPASS GRAFT     "CABG X4"  . LEFT HEART CATH AND CORS/GRAFTS ANGIOGRAPHY N/A 10/01/2017   Procedure: LEFT HEART CATH AND CORS/GRAFTS  ANGIOGRAPHY;  Surgeon: Burnell Blanks, MD;  Location: [place] [facility];  Service: Cardiovascular;  Laterality: N/A;  . LEFT HEART CATHETERIZATION WITH CORONARY/GRAFT ANGIOGRAM N/A 05/15/2013   Procedure: LEFT HEART CATHETERIZATION WITH Beatrix Fetters;  Surgeon: [name], MD;  Location: [facility];  Service: Cardiovascular;  Laterality: N/A;  . PERIPHERAL VASCULAR INTERVENTION  09/23/2016   Procedure: Peripheral Vascular Intervention;  Surgeon: Wellington Hampshire, MD;   Location: [place] [facility];  Service: Cardiovascular;;  Lt. SFA    Allergies as of 07/10/2020   No Known Allergies     Medication List       Accurate as of July 10, 2020 11:59 PM. If you have any questions, ask your nurse or doctor.        albuterol 108 (90 Base) MCG/ACT inhaler Commonly known as: VENTOLIN HFA Inhale 2 puffs into the lungs 4 (four) times daily. Started by: [name], MD   aspirin 81 MG EC tablet Take 1 tablet (81 mg total) by mouth daily with breakfast.   atorvastatin 80 MG tablet Commonly known as: LIPITOR Take 1 tablet (80 mg total) by mouth daily at 6 PM.   augmented betamethasone dipropionate 0.05 % cream Commonly known as: DIPROLENE-AF Apply topically 2 (two) times daily.   clopidogrel 75 MG tablet Commonly known as: PLAVIX Take 1 tablet (75 mg total) by mouth daily.   fenofibrate 145 MG tablet Commonly known as: Tricor Take 1 tablet (145 mg total) by mouth daily.   folic acid 1 MG tablet Commonly known as: FOLVITE Take 1 tablet (1 mg total) by mouth daily.   isosorbide mononitrate 60 MG 24 hr tablet Commonly known as: IMDUR TAKE 1 TABLET DAILY. PLEASE KEEP UPCOMING APPT IN JANUARY BEFORE ANYMORE REFILLS   lisinopril 10 MG tablet Commonly known as: ZESTRIL Take 1 tablet (10 mg total) by mouth daily.   metoprolol tartrate 25 MG tablet Commonly known as: LOPRESSOR Take 1 tablet (25 mg total) by mouth 2 (two) times daily.   multivitamin with minerals Tabs tablet Take 1 tablet by mouth daily.   nitroGLYCERIN 0.4 MG SL tablet Commonly known as: Nitrostat Place 1 tablet (0.4 mg total) under the tongue every 5 (five) minutes x 3 doses as needed for chest pain.   pantoprazole 40 MG tablet Commonly known as: PROTONIX Take 1 tablet (40 mg total) by mouth 2 (two) times daily.   Repatha SureClick 702 MG/ML Soaj Generic drug: Evolocumab Inject 1 pen into the skin every 14 (fourteen) days.   thiamine 100 MG tablet Take 1 tablet (100 mg  total) by mouth daily.   traZODone 50 MG tablet Commonly known as: DESYREL Take 1 tablet (50 mg total) by mouth at bedtime.          Objective:   Physical Exam BP (!) 155/82 (BP Location: Left Arm, Patient Position: Sitting, Cuff Size: Large)   Pulse 71   Temp 97.8 F (36.6 C) (Oral)   Ht 5\' 11"  (1.803 m)   Wt 234 lb 6.4 oz (106.3 kg)   SpO2 96%   BMI 32.69 kg/m  General:   Well developed, NAD, BMI noted. HEENT:  Normocephalic . Face symmetric, atraumatic Throat: Slightly red but no white patches or lesions.  Symmetric. Neck: No obvious lymphadenopathies. Lungs:  Prolonged expiratory time, rhonchi noted.  No crackles. Seems to have DOE when he walked around the office. Heart: RRR,  no murmur.  Lower extremities: no pretibial edema bilaterally  Skin: Not pale.  Not jaundice Neurologic:  alert & oriented X3.  Speech normal, gait appropriate for age and unassisted Psych--  Cognition and judgment appear intact.  Cooperative with normal attention span and concentration.  Behavior appropriate. No anxious or depressed appearing.      Assessment     Assessment   Prediabetes  HTN Dyslipidemia CV: --CAD: 2010 angioplasty f/u by  CABG 2010, cath 2014 -- CP, cath 09/2017, patent grafts, medical treatment --PVD --Stroke 2008, 02-2015, 2020 Anxiety depression  OSA: Sleep study 01/2017, significant sleep apnea noted Abnormal gait GI: Iron def anemia dx 03-2015  ---cscope 09-2015 multiple polyps, repeat in one year ---EGD-2017: Esophagitis, duodenitis, bx done Pancreatitis 06/2015.(EtOH 2 days prior to onset of symptoms)  etoh abuse H/o cocaine  Smoker  +FH: father prostate ca , dx late 60s Back pain:saw Ortho 01/2020, SXS felt to be mechanical due to: JD, deconditioning, residual weakness from the stroke.  PVD likely to be contributing.   PLAN Hoarseness: As reported by patient's for few months, he is a heavy smoker and drinker: Refer to ENT for a thorough  examination. Dysphagia: Started few months ago, seems to be functional (to solids and liquids and occasionally chokes with food).  Rx a barium swallow.  If that is unrevealing, refer to GI.  GERD symptoms are controlled noting that he went to the ER 04/11/2020 with a strokelike symptoms but the CT was not acute. Had a EGD 2017, reflux esophagitis noted, hiatal hernia, BX: Mild chronic inactive gastritis, no H. pylori. CAD Saw cardiology 04/22/2020: They noted that 2 weeks prior to the OV the pt  had a subtle increase of troponins while at the ER for respiratory symptoms, no work-up was deemed to be necessary. HTN: BP 155/82 today, reports better numbers at home, Continue Imdur, lisinopril, metoprolol, check a CMP. DM: Diet controlled, check A1c COPD: Some chest congestion noted today, previously Spiriva was not covered by insurance.  Will try simply albuterol for now.  To be used as needed.  He never pursued PFTs. OSA: Not using a CPAP, encouraged to do. Stroke symptoms: Martin Majestic to the ER 04/11/2020: At the time CBC CMP unremarkable, troponin was slightly elevated, CT head no acute, evaluation was incomplete, the patient eloped.  No further acute symptoms (dysphagia is going on for several months) Preventive care: Strongly declined influenza shot Recommend COVID vaccine booster. RTC 3 months   Time spent 42 minutes, due to the number of issues addressed today, also due to extensive chart review including ER records, cardiology records, previous EGDs.  This visit occurred during the SARS-CoV-2 public health emergency.  Safety protocols were in place, including screening questions prior to the visit, additional usage of staff PPE, and extensive cleaning of exam room while observing appropriate contact time as indicated for disinfecting solutions.

## 2020-07-11 LAB — COMPREHENSIVE METABOLIC PANEL
ALT: 13 U/L (ref 0–53)
AST: 12 U/L (ref 0–37)
Albumin: 4 g/dL (ref 3.5–5.2)
Alkaline Phosphatase: 135 U/L — ABNORMAL HIGH (ref 39–117)
BUN: 11 mg/dL (ref 6–23)
CO2: 28 mEq/L (ref 19–32)
Calcium: 8.7 mg/dL (ref 8.4–10.5)
Chloride: 98 mEq/L (ref 96–112)
Creatinine, Ser: 0.83 mg/dL (ref 0.40–1.50)
GFR: 96.97 mL/min (ref >60.00)
Glucose, Bld: 130 mg/dL — ABNORMAL HIGH (ref 70–99)
Potassium: 4.2 mEq/L (ref 3.5–5.1)
Sodium: 134 mEq/L — ABNORMAL LOW (ref 135–145)
Total Bilirubin: 1.2 mg/dL (ref 0.2–1.2)
Total Protein: 7.1 g/dL (ref 6.0–8.3)

## 2020-07-11 LAB — HEMOGLOBIN A1C: Hgb A1c MFr Bld: 7 % — ABNORMAL HIGH (ref 4.6–6.5)

## 2020-07-12 ENCOUNTER — Telehealth: Payer: Self-pay

## 2020-07-12 DIAGNOSIS — I639 Cerebral infarction, unspecified: Secondary | ICD-10-CM

## 2020-07-12 DIAGNOSIS — R490 Dysphonia: Secondary | ICD-10-CM

## 2020-07-12 NOTE — Telephone Encounter (Signed)
Placed correct order.

## 2020-07-12 NOTE — Telephone Encounter (Signed)
-----   Message from Colon Branch, MD sent at 07/12/2020  1:09 PM EST ----- Regarding: Mel Almond, See message from [facility], please arrange Good Afternoon Dr.[name], you put in a Modified Barium Swallow order on 12.1. The order in the system is incorrect. Can you place new order for this patient? SLP [id number] is the order number. Thank you!

## 2020-07-12 NOTE — Assessment & Plan Note (Signed)
Hoarseness: As reported by patient's for few months, he is a heavy smoker and drinker: Refer to ENT for a thorough examination. Dysphagia: Started few months ago, seems to be functional (to solids and liquids and occasionally chokes with food).  Rx a barium swallow.  If that is unrevealing, refer to GI.  GERD symptoms are controlled noting that he went to the ER 04/11/2020 with a strokelike symptoms but the CT was not acute. Had a EGD 2017, reflux esophagitis noted, hiatal hernia, BX: Mild chronic inactive gastritis, no H. pylori. CAD Saw cardiology 04/22/2020: They noted that 2 weeks prior to the OV the pt  had a subtle increase of troponins while at the ER for respiratory symptoms, no work-up was deemed to be necessary. HTN: BP 155/82 today, reports better numbers at home, Continue Imdur, lisinopril, metoprolol, check a CMP. DM: Diet controlled, check A1c COPD: Some chest congestion noted today, previously Spiriva was not covered by insurance.  Will try simply albuterol for now.  To be used as needed.  He never pursued PFTs. OSA: Not using a CPAP, encouraged to do. Stroke symptoms: Martin Majestic to the ER 04/11/2020: At the time CBC CMP unremarkable, troponin was slightly elevated, CT head no acute, evaluation was incomplete, the patient eloped.  No further acute symptoms (dysphagia is going on for several months) Preventive care: Strongly declined influenza shot Recommend COVID vaccine booster. RTC 3 months

## 2020-07-15 ENCOUNTER — Other Ambulatory Visit (HOSPITAL_COMMUNITY): Payer: Self-pay | Admitting: Internal Medicine

## 2020-07-15 DIAGNOSIS — R1310 Dysphagia, unspecified: Secondary | ICD-10-CM

## 2020-07-15 MED ORDER — METFORMIN HCL 500 MG PO TABS
500.0000 mg | ORAL_TABLET | Freq: Two times a day (BID) | ORAL | 1 refills | Status: DC
Start: 2020-07-15 — End: 2020-10-22

## 2020-07-15 NOTE — Addendum Note (Signed)
Addended byDamita Dunnings D on: 07/15/2020 07:53 AM   Modules accepted: Orders

## 2020-07-22 ENCOUNTER — Other Ambulatory Visit: Payer: Self-pay

## 2020-07-22 DIAGNOSIS — R1310 Dysphagia, unspecified: Secondary | ICD-10-CM

## 2020-07-22 NOTE — Addendum Note (Signed)
Addended byDamita Dunnings D on: 07/22/2020 03:41 PM   Modules accepted: Orders

## 2020-07-25 ENCOUNTER — Ambulatory Visit (HOSPITAL_COMMUNITY): Payer: Medicare HMO

## 2020-07-25 ENCOUNTER — Encounter (HOSPITAL_COMMUNITY): Payer: Medicare HMO

## 2020-08-17 ENCOUNTER — Other Ambulatory Visit: Payer: Self-pay | Admitting: Internal Medicine

## 2020-08-17 DIAGNOSIS — J449 Chronic obstructive pulmonary disease, unspecified: Secondary | ICD-10-CM

## 2020-08-19 ENCOUNTER — Other Ambulatory Visit: Payer: Self-pay | Admitting: Physician Assistant

## 2020-09-03 ENCOUNTER — Other Ambulatory Visit: Payer: Self-pay

## 2020-09-03 ENCOUNTER — Ambulatory Visit (INDEPENDENT_AMBULATORY_CARE_PROVIDER_SITE_OTHER): Payer: Medicare HMO | Admitting: Otolaryngology

## 2020-09-03 ENCOUNTER — Encounter (INDEPENDENT_AMBULATORY_CARE_PROVIDER_SITE_OTHER): Payer: Self-pay | Admitting: Otolaryngology

## 2020-09-03 VITALS — Temp 97.3°F

## 2020-09-03 DIAGNOSIS — R1314 Dysphagia, pharyngoesophageal phase: Secondary | ICD-10-CM | POA: Diagnosis not present

## 2020-09-03 DIAGNOSIS — R490 Dysphonia: Secondary | ICD-10-CM

## 2020-09-03 NOTE — Progress Notes (Signed)
HPI: [name]. is a 60 y.o. male who presents is referred by his PCP for evaluation of chronic hoarseness and difficulty swallowing.  He dates his problems back to the summer of last year when he had symptoms of a stroke and was seen at the ED where they performed an MRI scan that did not demonstrate any stroke.  He has had a long history of vascular disease.  He has had bypass surgery performed on his heart about 5 years ago.  He has had several stents placed.  He states that he has had two strokes in the past.  Last summer he apparently had symptoms of a stroke and underwent negative Covid testing and had an MRI scan that did not demonstrate a stroke.  But he states that his voice comes and goes and that he seems to have a hard time swallowing although he has gained roughly 30 pounds since last summer. He continues to smoke about a pack a day.[name] He has only minor hoarseness in the office today and does not have that much difficulty talking although he does seem to struggle a little bit.  He seems out of breath and is significantly overweight. Patient does have history of GE reflux disease and takes Protonix in the morning.    Past Medical History:  Diagnosis Date  . Abnormality of gait   . Anxiety   . CAD (coronary artery disease)    a. 10/2008 Inferolateral STEMI: 3VD w/ occluded LCX (PTCA)-->CABG x 3 (LIMA->LAD, VG->OM, VG->PDA);  b. 06/2009 NSTEMI/PCI: VG->OM 100 (BMS);  c. 05/2013 native 3VD, 3/3 patent grafts. d. 06/2016: Inferior STEMI w/ occluded SVG-OM (DES placed). LIMA-LAD and SVG-PDA patent.  . Chronic bronchitis ([name])   . Chronic diastolic CHF (congestive heart failure) ([place])    a. 02/2015 Echo: EF 50-55%, distal septal, apical, inferobasal HK, mild LVH, mild MR, mildly dil LA.  [name] Cocaine use   . Colon polyps    a. 09/2015 colonscopy: multiple sessile polyps, path negative for high grade dysplasia.  . Depression   . Emphysema (subcutaneous) (surgical) resulting from a  procedure 10/2008.   Bleb resected at time of 10/2008 CABG. Marketed emphysema noted on surgical report.  . Esophagitis    a. 2017 EGD: esophagitis, duodenitis.  [name] ETOH abuse   . GERD (gastroesophageal reflux disease)   . Hemianopia, homonymous, right    "can't see out the right side of either eye since stroke in 2016/pt description 09/23/2016  . Hepatic steatosis   . Hiatal hernia   . History of nuclear stress test 08/2017   Nuclear stress test 1/19: EF 35, inf-lat and ant-lat, apical sept/apical scar; no ischemia; Intermediate Risk  . Hyperlipidemia   . Hypertension   . Iron deficiency anemia 2010  . Morbid obesity ([place])   . Myocardial infarction ([place]) "several"  . PAD (peripheral artery disease) ([place])   . Pancreatitis 06/2015  . Sleep apnea    "don't have a mask" (09/23/2016)  . Stroke ([place]) 02/2015   a. 02/2015 Carotid U/S: 1-39% bilat ICA stenosis; "left me blind" (09/23/2016)  . Tobacco abuse    still smokes  . Tubular adenoma of colon    Past Surgical History:  Procedure Laterality Date  . ABDOMINAL AORTOGRAM W/LOWER EXTREMITY N/A 09/23/2016   Procedure: Abdominal Aortogram w/Lower Extremity;  Surgeon: Kenneth Hampshire, MD;  Location: [place] [facility];  Service: Cardiovascular;  Laterality: N/A;  . CARDIAC CATHETERIZATION  11/07/2008   EF of 50-55% --  normal LV systolic function, acute inferolateral ST elevation MI,  PTCA of the circumflex artery -- [name]. [name], [name]  . CARDIAC CATHETERIZATION N/A 07/07/2016   Procedure: Left Heart Cath and Cors/Grafts Angiography;  Surgeon: Kenneth M Martinique, MD;  Location: [place] [facility];  Service: Cardiovascular;  Laterality: N/A;  . CARDIAC CATHETERIZATION N/A 07/07/2016   Procedure: Coronary Stent Intervention;  Surgeon: Kenneth M Martinique, MD;  Location: [place] [facility];  Service: Cardiovascular;  Laterality: N/A;  . CORONARY ANGIOPLASTY    . CORONARY ARTERY BYPASS GRAFT     "CABG X4"  . LEFT HEART CATH AND CORS/GRAFTS  ANGIOGRAPHY N/A 10/01/2017   Procedure: LEFT HEART CATH AND CORS/GRAFTS ANGIOGRAPHY;  Surgeon: Kenneth Blanks, MD;  Location: [place] [facility];  Service: Cardiovascular;  Laterality: N/A;  . LEFT HEART CATHETERIZATION WITH CORONARY/GRAFT ANGIOGRAM N/A 05/15/2013   Procedure: LEFT HEART CATHETERIZATION WITH [name];  Surgeon: Kenneth M Martinique, MD;  Location[facility];  Service: Cardiovascular;  Laterality: N/A;  . PERIPHERAL VASCULAR INTERVENTION  09/23/2016   Procedure: Peripheral Vascular Intervention;  Surgeon: Kenneth Hampshire, MD;  Location: [place] [facility];  Service: Cardiovascular;;  [name] SFA   Social History   Socioeconomic History  . Marital status: Single    Spouse name: Not on file  . Number of children: 2  . Years of education: Not on file  . Highest education level: Not on file  Occupational History  . Occupation: not working , used to be a Chief Executive Officer   . Occupation: disable  Tobacco Use  . Smoking status: Current Every Day Smoker    Packs/day: 1.00    Years: 43.00    Pack years: 43.00    Types: Cigarettes    Start date: 36  . Smokeless tobacco: Never Used  Vaping Use  . Vaping Use: Never used  Substance and Sexual Activity  . Alcohol use: Yes    Alcohol/week: 14.0 standard drinks    Types: 14 Cans of beer per week    Comment: as off 12/2017: beer 12-pack q week  . Drug use: No    Comment: 09/23/2016 "none in years"  . Sexual activity: Not Currently  Other Topics Concern  . Not on file  Social History Narrative   Lives w/ girlfriend    Social Determinants of Health   Financial Resource Strain: Not on file  Food Insecurity: Not on file  Transportation Needs: Not on file  Physical Activity: Not on file  Stress: Not on file  Social Connections: Not on file   Family History  Problem Relation Age of Onset  . Heart disease Mother   . Heart failure Mother   . Diabetes Mother   . Colon cancer Father        late 68s  .  Prostate cancer Neg Hx    No Known Allergies Prior to Admission medications   Medication Sig Start Date End Date Taking? Authorizing Provider  albuterol (VENTOLIN HFA) 108 (90 Base) MCG/ACT inhaler Inhale 2 puffs into the lungs every 6 (six) hours as needed for wheezing or shortness of breath. 08/19/20   Colon Branch, MD  aspirin EC 81 MG EC tablet Take 1 tablet (81 mg total) by mouth daily with breakfast. 11/28/18   Roxan Hockey, MD  atorvastatin (LIPITOR) 80 MG tablet Take 1 tablet (80 mg total) by mouth daily at 6 PM. 02/20/20   Kenneth Blanks, MD  augmented betamethasone dipropionate (DIPROLENE-AF) 0.05 %  cream Apply topically 2 (two) times daily. 12/08/17   Colon Branch, MD  clopidogrel (PLAVIX) 75 MG tablet Take 1 tablet (75 mg total) by mouth daily. 02/20/20   Kenneth Blanks, MD  Evolocumab (REPATHA SURECLICK) [id number] MG/ML SOAJ Inject 1 pen into the skin every 14 (fourteen) days. 01/01/20   Kenneth Blanks, MD  fenofibrate (TRICOR) 145 MG tablet Take 1 tablet (145 mg total) by mouth daily. 11/28/18   Roxan Hockey, MD  folic acid (FOLVITE) 1 MG tablet Take 1 tablet (1 mg total) by mouth daily. 11/28/18   Roxan Hockey, MD  isosorbide mononitrate (IMDUR) 60 MG 24 hr tablet TAKE 1 TABLET DAILY. PLEASE KEEP UPCOMING APPT IN JANUARY BEFORE ANYMORE REFILLS 12/18/19   Kenneth Blanks, MD  lisinopril (ZESTRIL) 10 MG tablet TAKE 1 TABLET EVERY DAY (DOSE INCREASE) 08/19/20   Kenneth Blanks, MD  metFORMIN (GLUCOPHAGE) 500 MG tablet Take 1 tablet (500 mg total) by mouth 2 (two) times daily with a meal. 07/15/20   Colon Branch, MD  metoprolol tartrate (LOPRESSOR) 25 MG tablet Take 1 tablet (25 mg total) by mouth 2 (two) times daily. 02/20/20   Kenneth Blanks, MD  Multiple Vitamin (MULTIVITAMIN WITH MINERALS) TABS tablet Take 1 tablet by mouth daily. 11/29/18   Roxan Hockey, MD  nitroGLYCERIN (NITROSTAT) 0.4 MG SL tablet Place 1 tablet (0.4 mg total) under  the tongue every 5 (five) minutes x 3 doses as needed for chest pain. 05/29/20   Colon Branch, MD  pantoprazole (PROTONIX) 40 MG tablet Take 1 tablet (40 mg total) by mouth 2 (two) times daily. 02/20/20   Kenneth Blanks, MD  thiamine 100 MG tablet Take 1 tablet (100 mg total) by mouth daily. 11/29/18   Roxan Hockey, MD  traZODone (DESYREL) 50 MG tablet Take 1 tablet (50 mg total) by mouth at bedtime. 11/28/18   Roxan Hockey, MD     Positive ROS: Otherwise negative  All other systems have been reviewed and were otherwise negative with the exception of those mentioned in the HPI and as above.  Physical Exam: Constitutional: Alert, well-appearing, no acute distress.  Patient with minimal hoarseness in the office today but does seem to struggle and be a little bit out of breath on speaking. Ears: External ears without lesions or tenderness. Ear canals are clear bilaterally with intact, clear TMs.  Nasal: External nose without lesions. Septum slightly deviated to the left with moderate rhinitis and clear mucus discharge..  Both the middle meatus regions are clear.  No polyps noted. Oral: Lips and gums without lesions. Tongue and palate mucosa without lesions. Posterior oropharynx clear.  Patient has moderate sized tonsils bilaterally which are symmetric. Fiberoptic laryngoscopy was performed through the right nostril.  The nasopharynx was clear.  The base of tongue vallecula and epiglottis were normal.  The vocal cords had mild edema but no significant vocal cord lesions noted and both cords had symmetric mobility.  Piriform sinuses were clear.  He had mild arytenoid edema.  I was able to pass the fiberoptic endoscope through the upper esophageal sphincter without difficulty and the upper cervical esophagus was clear. Neck: No palpable adenopathy or masses Respiratory: Breathing comfortably  Skin: No facial/neck lesions or rash noted.  Laryngoscopy  Date/Time: 09/03/2020 5:08  PM Performed by: Rozetta Nunnery, MD Authorized by: Rozetta Nunnery, MD   Consent:    Consent obtained:  Verbal   Consent given by:  Patient Procedure details:  Indications: hoarseness, dysphagia, or aspiration     Medication:  Afrin   Instrument: flexible fiberoptic laryngoscope     Scope location: right nare   Sinus:    Right nasopharynx: normal   Mouth:    Oropharynx: normal     Vallecula: normal     Base of tongue: normal     Epiglottis: normal   Throat:    Pyriform sinus: normal     True vocal cords: normal   Comments:     On fiberoptic laryngoscopy the hypopharynx and larynx was clear.  Vocal cords revealed mild edema but no significant vocal cord lesions noted.  Vocal cords had symmetric mobility.  He had mild edema of the arytenoid mucosa consistent with probable reflux symptoms. The fiberoptic laryngoscope was passed through the upper esophageal sphincter without difficulty in the upper cervical esophagus was clear.    Assessment: Normal laryngeal examination with no vocal cord abnormalities noted.  He does have reflux type symptoms and is presently on Protonix according to the patient. Dysphagia questionable etiology although he has gained weight over the past 6 months.  Plan: Concerning his swallowing difficulty we will plan on scheduling him for a modified barium swallow with speech therapy. Reassured him of normal vocal cord examination. I did encourage him on trying to stop smoking as this will make his voice worse as well as his vascular disease. Also encouraged him on weight loss for his overall health. He will call us back after his modified barium swallow concerning results.   Radene Journey, MD   CC:

## 2020-09-06 ENCOUNTER — Other Ambulatory Visit (HOSPITAL_COMMUNITY): Payer: Self-pay | Admitting: *Deleted

## 2020-09-06 ENCOUNTER — Other Ambulatory Visit (INDEPENDENT_AMBULATORY_CARE_PROVIDER_SITE_OTHER): Payer: Self-pay

## 2020-09-06 DIAGNOSIS — R1314 Dysphagia, pharyngoesophageal phase: Secondary | ICD-10-CM

## 2020-09-06 DIAGNOSIS — R1310 Dysphagia, unspecified: Secondary | ICD-10-CM

## 2020-09-13 ENCOUNTER — Other Ambulatory Visit: Payer: Self-pay

## 2020-09-13 ENCOUNTER — Ambulatory Visit (HOSPITAL_COMMUNITY)
Admission: RE | Admit: 2020-09-13 | Discharge: 2020-09-13 | Disposition: A | Discharge status: Home / Self Care | Payer: Medicare HMO | Source: Ambulatory Visit | Attending: Otolaryngology | Admitting: Otolaryngology

## 2020-09-13 DIAGNOSIS — R1314 Dysphagia, pharyngoesophageal phase: Secondary | ICD-10-CM

## 2020-09-13 DIAGNOSIS — R1310 Dysphagia, unspecified: Secondary | ICD-10-CM | POA: Insufficient documentation

## 2020-09-13 DIAGNOSIS — T17320A Food in larynx causing asphyxiation, initial encounter: Secondary | ICD-10-CM | POA: Diagnosis not present

## 2020-09-30 ENCOUNTER — Telehealth (INDEPENDENT_AMBULATORY_CARE_PROVIDER_SITE_OTHER): Payer: Self-pay | Admitting: Otolaryngology

## 2020-09-30 NOTE — Telephone Encounter (Signed)
Called patient concerning results of his modified barium swallow.  This demonstrated mostly a functional problem with his swallowing possibly related to previous strokes.  I suspect he does have some degree of reflux and would recommend continuation with his reflux medication Protonix. He has actually gained weight over the past 6 months and would recommend weight loss. After discussion with the speech therapist performing the modified barium swallow she stated that the patient had difficulty swallowing large boluses and suggested possibly obtaining a standard barium swallow test for further evaluation of the esophagus.  And also suggested follow-up with outpatient speech therapy concerning assistance in swallowing therapy if needed. No structural abnormalities were noted on the modified barium swallow or on fiberoptic laryngoscopy through the upper esophageal sphincter in the office.

## 2020-10-07 ENCOUNTER — Other Ambulatory Visit: Payer: Self-pay | Admitting: Cardiovascular Disease

## 2020-10-08 ENCOUNTER — Ambulatory Visit: Payer: Medicare HMO | Admitting: Internal Medicine

## 2020-10-22 ENCOUNTER — Encounter: Payer: Self-pay | Admitting: Internal Medicine

## 2020-10-22 ENCOUNTER — Ambulatory Visit (INDEPENDENT_AMBULATORY_CARE_PROVIDER_SITE_OTHER): Payer: Medicare HMO | Admitting: Internal Medicine

## 2020-10-22 ENCOUNTER — Other Ambulatory Visit: Payer: Self-pay

## 2020-10-22 VITALS — BP 144/88 | HR 78 | Temp 97.8°F | Resp 16 | Ht 71.0 in | Wt 226.5 lb

## 2020-10-22 DIAGNOSIS — R1310 Dysphagia, unspecified: Secondary | ICD-10-CM | POA: Diagnosis not present

## 2020-10-22 DIAGNOSIS — Z91199 Patient's noncompliance with other medical treatment and regimen due to unspecified reason: Secondary | ICD-10-CM

## 2020-10-22 DIAGNOSIS — J9611 Chronic respiratory failure with hypoxia: Secondary | ICD-10-CM | POA: Diagnosis not present

## 2020-10-22 DIAGNOSIS — J449 Chronic obstructive pulmonary disease, unspecified: Secondary | ICD-10-CM | POA: Diagnosis not present

## 2020-10-22 DIAGNOSIS — E119 Type 2 diabetes mellitus without complications: Secondary | ICD-10-CM | POA: Diagnosis not present

## 2020-10-22 DIAGNOSIS — F17200 Nicotine dependence, unspecified, uncomplicated: Secondary | ICD-10-CM

## 2020-10-22 DIAGNOSIS — Z9119 Patient's noncompliance with other medical treatment and regimen: Secondary | ICD-10-CM

## 2020-10-22 DIAGNOSIS — E785 Hyperlipidemia, unspecified: Secondary | ICD-10-CM

## 2020-10-22 MED ORDER — METFORMIN HCL 500 MG PO TABS: 500.0000 mg | ORAL_TABLET | Freq: Two times a day (BID) | ORAL | 3 refills | Status: DC

## 2020-10-22 MED ORDER — BUDESONIDE-FORMOTEROL FUMARATE 160-4.5 MCG/ACT IN AERO
2.0000 | INHALATION_SPRAY | Freq: Two times a day (BID) | RESPIRATORY_TRACT | 3 refills | Status: DC
Start: 2020-10-22 — End: 2021-03-10

## 2020-10-22 MED ORDER — TRAZODONE HCL 50 MG PO TABS: 50.0000 mg | ORAL_TABLET | Freq: Every day | ORAL | 1 refills | Status: DC

## 2020-10-22 MED ORDER — FENOFIBRATE 145 MG PO TABS: 145.0000 mg | ORAL_TABLET | Freq: Every day | ORAL | 3 refills | Status: DC

## 2020-10-22 NOTE — Patient Instructions (Addendum)
Start Symbicort twice daily every day.  You still can use albuterol as needed  Start Metformin 500 mg twice a day  We are refilling fenofibrate  Contact cardiology about Repatha  We are referring you to the pulmonary doctor for follow-up on the sleep apnea, emphysema and low oxygen Please call them at [phone]  We are requesting you to start using oxygen  We are referring you to a speech therapy for further evaluation  Schedule a visit with me in 4 weeks

## 2020-10-22 NOTE — Progress Notes (Signed)
Subjective:    Patient ID: [name]., male    DOB: [birth year], 60 y.o.   MRN: [id number]  DOS:  10/22/2020 Type of visit - description: here w/ his sister  Since the last office visit, saw ENT, chart reviewed. Had a swallow study: See ENT comments below.    Called patient concerning results of his modified barium swallow.  This demonstrated mostly a functional problem with his swallowing possibly related to previous strokes.  I suspect he does have some degree of reflux and would recommend continuation with his reflux medication Protonix. He has actually gained weight over the past 6 months and would recommend weight loss. After discussion with the speech therapist performing the modified barium swallow she stated that the patient had difficulty swallowing large boluses and suggested possibly obtaining a standard barium swallow test for further evaluation of the esophagus.  And also suggested follow-up with outpatient speech therapy concerning assistance in swallowing therapy if needed. No structural abnormalities were noted on the modified barium swallow or on fiberoptic laryngoscopy through the upper esophageal sphincter in the office.     Poor compliance with medications Still smoking Not drinking alcohol Complain of shortness of breath, ongoing cough with brownish sputum, + wheezing but no hemoptysis. Has occasional chest pain, not a new issue, no crescendo. I noted his a speech to be somewhat slurred, not a new issue per [name], his sister.   Review of Systems See above   Past Medical History:  Diagnosis Date   Abnormality of gait    Anxiety    CAD (coronary artery disease)    a. 10/2008 Inferolateral STEMI: 3VD w/ occluded LCX (PTCA)-->CABG x 3 (LIMA->LAD, VG->OM, VG->PDA);  b. 06/2009 NSTEMI/PCI: VG->OM 100 (BMS);  c. 05/2013 native 3VD, 3/3 patent grafts. d. 06/2016: Inferior STEMI w/ occluded SVG-OM (DES placed). LIMA-LAD and SVG-PDA patent.   Chronic  bronchitis (HCC)    Chronic diastolic CHF (congestive heart failure) ([place])    a. 02/2015 Echo: EF 50-55%, distal septal, apical, inferobasal HK, mild LVH, mild MR, mildly dil LA.   Cocaine use    Colon polyps    a. 09/2015 colonscopy: multiple sessile polyps, path negative for high grade dysplasia.   Depression    Emphysema (subcutaneous) (surgical) resulting from a procedure 10/2008.   Bleb resected at time of 10/2008 CABG. Marketed emphysema noted on surgical report.   Esophagitis    a. 2017 EGD: esophagitis, duodenitis.   ETOH abuse    GERD (gastroesophageal reflux disease)    Hemianopia, homonymous, right    "can't see out the right side of either eye since stroke in 2016/pt description 09/23/2016   Hepatic steatosis    Hiatal hernia    History of nuclear stress test 08/2017   Nuclear stress test 1/19: EF 35, inf-lat and ant-lat, apical sept/apical scar; no ischemia; Intermediate Risk   Hyperlipidemia    Hypertension    Iron deficiency anemia 2010   Morbid obesity ([place])    Myocardial infarction [facility]) "several"   PAD (peripheral artery disease) ([place])    Pancreatitis 06/2015   Sleep apnea    "don't have a mask" (09/23/2016)   Stroke ([place]) 02/2015   a. 02/2015 Carotid U/S: 1-39% bilat ICA stenosis; "left me blind" (09/23/2016)   Tobacco abuse    still smokes   Tubular adenoma of colon     Past Surgical History:  Procedure Laterality Date   ABDOMINAL AORTOGRAM W/LOWER EXTREMITY N/A 09/23/2016   Procedure: Abdominal Aortogram w/Lower  Extremity;  Surgeon: Wellington Hampshire, MD;  Location: [place] [facility];  Service: Cardiovascular;  Laterality: N/A;   CARDIAC CATHETERIZATION  11/07/2008   EF of 50-55% -- normal LV systolic function, acute inferolateral ST elevation MI,  PTCA of the circumflex artery -- [name]. [name], [name]   CARDIAC CATHETERIZATION N/A 07/07/2016   Procedure: Left Heart Cath and Cors/Grafts Angiography;  Surgeon: [name], MD;   Location: [place] [facility];  Service: Cardiovascular;  Laterality: N/A;   CARDIAC CATHETERIZATION N/A 07/07/2016   Procedure: Coronary Stent Intervention;  Surgeon: [name], MD;  Location: [place] [facility];  Service: Cardiovascular;  Laterality: N/A;   CORONARY ANGIOPLASTY     CORONARY ARTERY BYPASS GRAFT     "CABG X4"   LEFT HEART CATH AND CORS/GRAFTS ANGIOGRAPHY N/A 10/01/2017   Procedure: LEFT HEART CATH AND CORS/GRAFTS ANGIOGRAPHY;  Surgeon: Burnell Blanks, MD;  Location: [place] [facility];  Service: Cardiovascular;  Laterality: N/A;   LEFT HEART CATHETERIZATION WITH CORONARY/GRAFT ANGIOGRAM N/A 05/15/2013   Procedure: LEFT HEART CATHETERIZATION WITH Beatrix Fetters;  Surgeon: [name], MD;  Location: [facility];  Service: Cardiovascular;  Laterality: N/A;   PERIPHERAL VASCULAR INTERVENTION  09/23/2016   Procedure: Peripheral Vascular Intervention;  Surgeon: Wellington Hampshire, MD;  Location: [place] [facility];  Service: Cardiovascular;;  Lt. SFA    Allergies as of 10/22/2020   No Known Allergies     Medication List       Accurate as of October 22, 2020 11:59 PM. If you have any questions, ask your nurse or doctor.        STOP taking these medications   Repatha SureClick 627 MG/ML [place] Generic drug: Evolocumab Stopped by: [name], MD     TAKE these medications   albuterol 108 (90 Base) MCG/ACT inhaler Commonly known as: VENTOLIN HFA Inhale 2 puffs into the lungs every 6 (six) hours as needed for wheezing or shortness of breath.   aspirin 81 MG EC tablet Take 1 tablet (81 mg total) by mouth daily with breakfast.   atorvastatin 80 MG tablet Commonly known as: LIPITOR Take 1 tablet (80 mg total) by mouth daily at 6 PM.   augmented betamethasone dipropionate 0.05 % cream Commonly known as: DIPROLENE-AF Apply topically 2 (two) times daily.   budesonide-formoterol 160-4.5 MCG/ACT inhaler Commonly known as: SYMBICORT Inhale 2 puffs  into the lungs 2 (two) times daily. Started by: [name], MD   clopidogrel 75 MG tablet Commonly known as: PLAVIX Take 1 tablet (75 mg total) by mouth daily.   fenofibrate 145 MG tablet Commonly known as: Tricor Take 1 tablet (145 mg total) by mouth daily.   folic acid 1 MG tablet Commonly known as: FOLVITE Take 1 tablet (1 mg total) by mouth daily.   isosorbide mononitrate 60 MG 24 hr tablet Commonly known as: IMDUR TAKE 1 TABLET EVERY DAY   lisinopril 10 MG tablet Commonly known as: ZESTRIL TAKE 1 TABLET EVERY DAY (DOSE INCREASE)   metFORMIN 500 MG tablet Commonly known as: GLUCOPHAGE Take 1 tablet (500 mg total) by mouth 2 (two) times daily with a meal.   metoprolol tartrate 25 MG tablet Commonly known as: LOPRESSOR Take 1 tablet (25 mg total) by mouth 2 (two) times daily.   multivitamin with minerals Tabs tablet Take 1 tablet by mouth daily.   nitroGLYCERIN 0.4 MG SL tablet Commonly known as: Nitrostat Place 1 tablet (0.4 mg total) under the  tongue every 5 (five) minutes x 3 doses as needed for chest pain.   pantoprazole 40 MG tablet Commonly known as: PROTONIX Take 1 tablet (40 mg total) by mouth 2 (two) times daily.   thiamine 100 MG tablet Take 1 tablet (100 mg total) by mouth daily.   traZODone 50 MG tablet Commonly known as: DESYREL Take 1 tablet (50 mg total) by mouth at bedtime.            Durable Medical Equipment  (From admission, onward)         Start     Ordered   10/22/20 0000  For home use only DME oxygen       Question Answer Comment  Length of Need Lifetime   Mode or (Route) Nasal cannula   Liters per Minute 2   Frequency Continuous (stationary and portable oxygen unit needed)   Oxygen conserving device Yes   Oxygen delivery system Gas      10/22/20 1604             Objective:   Physical Exam BP (!) 144/88 (BP Location: Left Arm, Patient Position: Sitting, Cuff Size: Normal)    Pulse 78    Temp 97.8 F (36.6 C) (Oral)     Resp 16    Ht 5\' 11"  (1.803 m)    Wt 226 lb 8 oz (102.7 kg)    SpO2 (!) 78%    BMI 31.59 kg/m  General:   Well developed, NAD, BMI noted. HEENT:  Normocephalic . Face symmetric, atraumatic Lungs:  + Rhonchi, large airway congestion and wheezing.  Frequent cough noted. Normal respiratory effort, no intercostal retractions, no accessory muscle use. Heart: RRR,  no murmur.  Lower extremities: Trace pretibial edema bilaterally  Skin: Not pale. Not jaundice Neurologic:  alert & oriented X3.  Speech: Slightly slurred?  (Denies EtOH, patient sister tells me this is how he talks for the last few months). Gait appropriate for age and unassisted Psych--  Cognition and judgment appear intact.  Cooperative with normal attention span and concentration.  Behavior appropriate. No anxious or depressed appearing.      Assessment      Assessment   DM HTN Dyslipidemia CV: --CAD: 2010 angioplasty f/u by  CABG 2010, cath 2014 -- CP, cath 09/2017, patent grafts, medical treatment --PVD --Stroke 2008, 02-2015, 2020 Anxiety depression  OSA: Sleep study 01/2017, significant sleep apnea noted Abnormal gait GI: Iron def anemia dx 03-2015  ---cscope 09-2015 multiple polyps, repeat in one year ---EGD-2017: Esophagitis, duodenitis, bx done Pancreatitis 06/2015.(EtOH 2 days prior to onset of symptoms)  etoh abuse H/o cocaine  Smoker  +FH: father prostate ca , dx late 60s Back pain:saw Ortho 01/2020, SXS felt to be mechanical due to: JD, deconditioning, residual weakness from the stroke.  PVD likely to be contributing. COPD: Clinical diagnosis, has not pursued PFTs  PLAN Noncompliance: Not taking Repatha (cost), not taking fenofibrate Not taking trazodone, request refill. Not taking metformin DM: New diagnosis, A1c was 7.0 07/2020, Rx Metformin, not taking it.  New prescription sent. HTN: Seems okay today. Dyslipidemia: Not taking Repatha due to cost, not taking fenofibrate, reason?  I RF   fenofibrate.  Rec to contact cardiology CAD: Has a sporadic chest pain, no crescendo. Anxiety, depression, insomnia: Refill trazodone. OSA, hypoxemia, COPD: Previously I attempted to prescribe Caprice Renshaw but it was not approved by his insurance.  Currently on albuterol. Plan: Refer to pulmonary, Symbicort, oxygen supplementation based on O2 sat of 78% with  exertion today (recheck at rest 93%). Hoarseness: Saw Dr. [name] 09/03/2020, normal vocal cord exam, he rec  a swallow examination, it showed   swallow  dysfunction possibly related to previous strokes,  difficult time swallowing large boluses.  See detailed report. They recommended a follow-up outpatient speech therapist.  Will arrange. He was also prescribed PPIs Dysphagia: See above Social: He is here with his sister [name], she lives in [place], she is willing to help her brother, she will be checking MyCchart messages and coordinating his appointments.  She can be reached at [phone]. She is trying to become the healthcare power of attorney, she was of great help today RTC 4 weeks   Time spent today: 42 minutes, addressing noncompliance, refill of medications, explaining plan moving forward to the patient his sister.     This visit occurred during the SARS-CoV-2 public health emergency.  Safety protocols were in place, including screening questions prior to the visit, additional usage of staff PPE, and extensive cleaning of exam room while observing appropriate contact time as indicated for disinfecting solutions.

## 2020-10-22 NOTE — Progress Notes (Unsigned)
SATURATION QUALIFICATIONS: (This note is used to comply with regulatory documentation for home oxygen)  Patient Saturations on Room Air at Rest = 93%  Patient Saturations on Room Air while Ambulating = 78%  Patient Saturations on  Liters of oxygen while Ambulating = %  Please briefly explain why patient needs home oxygen:

## 2020-10-23 NOTE — Assessment & Plan Note (Signed)
Noncompliance: Not taking Repatha (cost), not taking fenofibrate Not taking trazodone, request refill. Not taking metformin DM: New diagnosis, A1c was 7.0 07/2020, Rx Metformin, not taking it.  New prescription sent. HTN: Seems okay today. Dyslipidemia: Not taking Repatha due to cost, not taking fenofibrate, reason?  I RF  fenofibrate.  Rec to contact cardiology CAD: Has a sporadic chest pain, no crescendo. Anxiety, depression, insomnia: Refill trazodone. OSA, hypoxemia, COPD: Previously I attempted to prescribe [name] but it was not approved by his insurance.  Currently on albuterol. Plan: Refer to pulmonary, Symbicort, oxygen supplementation based on O2 sat of 78% with exertion today (recheck at rest 93%). Hoarseness: Saw Dr. [name] 09/03/2020, normal vocal cord exam, he rec  a swallow examination, it showed   swallow  dysfunction possibly related to previous strokes,  difficult time swallowing large boluses.  See detailed report. They recommended a follow-up outpatient speech therapist.  Will arrange. He was also prescribed PPIs Dysphagia: See above Social: He is here with his sister [name], she lives in [place], she is willing to help her brother, she will be checking MyCchart messages and coordinating his appointments.  She can be reached at [phone]. She is trying to become the healthcare power of attorney, she was of great help today RTC 4 weeks

## 2020-10-25 DIAGNOSIS — J9611 Chronic respiratory failure with hypoxia: Secondary | ICD-10-CM | POA: Diagnosis not present

## 2020-10-25 DIAGNOSIS — J449 Chronic obstructive pulmonary disease, unspecified: Secondary | ICD-10-CM | POA: Diagnosis not present

## 2020-10-28 ENCOUNTER — Ambulatory Visit: Payer: Medicare HMO | Admitting: Cardiovascular Disease

## 2020-10-28 NOTE — Progress Notes (Deleted)
No chief complaint on file.  History of Present Illness:  60 yo male with history of HTN, hyperlipidemia, tobacco abuse, former cocaine abuse, PAD and CAD here today for cardiac follow up. He presented with an inferolateral STEMI in March 2010 at which time his Circumflex was occluded and was opened with a balloon. He underwent emergent 3V CABG (LIMA to LAD, SVG to Circumflex, SVG to PDA) given his diffuse multivessel disease. In November 2010 he presented with a NSTEMI and had bare metal stenting of the totally occluded saphenous vein graft to the obtuse marginal artery. Cardiac cath October 2014 with 3 patent bypass grafts. Admitted July 2016 to [facility] with CVA. Echo 02/21/15 with normal LV function. Carotid dopplers July 2016 with mild bilateral carotid disease. Admitted to [facility] 07/07/16 with lateral STEMI secondary to acute occlusion of the SVG to the OM. A Synergy DES was placed in the body of the SVG to the OM. Normal LV function by LV gram. He had dyspnea with Brilinta and was discharged on ASA and Plavix. He is followed in the [facility] clinic by Dr. Fletcher Anon. Lower ext angiogram 09/23/16 with occluded left SFA which was treated with atherectomy, drug coated balloon angioplasty and stenting. Moderate disease in right SFA. He was seen by Dr. Fletcher Anon in the [facility] clinic in May 2021 and his LE disease was felt to be stable. Cardiac cath 10/01/17 with occlusion of all native vessels with 3 patent bypass grafts. He had an acute ischemic CVA in April 2020. Echo April 2020 with LVEF=60-65%. He refused rehab and signed out AMA. He was seen by telemedicine visit march 2021 and was stable. I saw him in the office in May 2021 and he c/o ongoing dyspnea, leg pain, back pain. He went into [facility] ED at [facility]12[facility]20 with c/o weakness. He thought he was having a stroke. CT head with no acute abnormalities. BMET and CBC were unremarkable. Troponin was 18 but he did not wish to go back to the ED as he had a beach trip planned.    He is here today for follow up. The patient denies any chest pain, dyspnea, palpitations, lower extremity edema, orthopnea, PND, dizziness, near syncope or syncope.    Primary Care Physician: Colon Branch, MD   Past Medical History:  Diagnosis Date  . Abnormality of gait   . Anxiety   . CAD (coronary artery disease)    a. 10/2008 Inferolateral STEMI: 3VD w/ occluded LCX (PTCA)-->CABG x 3 (LIMA->LAD, VG->OM, VG->PDA);  b. 06/2009 NSTEMI/PCI: VG->OM 100 (BMS);  c. 05/2013 native 3VD, 3/3 patent grafts. d. 06/2016: Inferior STEMI w/ occluded SVG-OM (DES placed). LIMA-LAD and SVG-PDA patent.  . Chronic bronchitis ([place])   . Chronic diastolic CHF (congestive heart failure) ([place])    a. 02/2015 Echo: EF 50-55%, distal septal, apical, inferobasal HK, mild LVH, mild MR, mildly dil LA.  [name] Cocaine use   . Colon polyps    a. 09/2015 colonscopy: multiple sessile polyps, path negative for high grade dysplasia.  . Depression   . Emphysema (subcutaneous) (surgical) resulting from a procedure 10/2008.   Bleb resected at time of 10/2008 CABG. Marketed emphysema noted on surgical report.  . Esophagitis    a. 2017 EGD: esophagitis, duodenitis.  [name] ETOH abuse   . GERD (gastroesophageal reflux disease)   . Hemianopia, homonymous, right    "can't see out the right side of either eye since stroke in 2016/pt description 09/23/2016  . Hepatic steatosis   .  Hiatal hernia   . History of nuclear stress test 08/2017   Nuclear stress test 1/19: EF 35, inf-lat and ant-lat, apical sept/apical scar; no ischemia; Intermediate Risk  . Hyperlipidemia   . Hypertension   . Iron deficiency anemia 2010  . Morbid obesity ([place])   . Myocardial infarction ([place]) "several"  . PAD (peripheral artery disease) ([place])   . Pancreatitis 06/2015  . Sleep apnea    "don't have a mask" (09/23/2016)  . Stroke ([place]) 02/2015   a. 02/2015 Carotid U/S: 1-39% bilat ICA stenosis; "left me blind" (09/23/2016)  . Tobacco abuse    still smokes  .  Tubular adenoma of colon     Past Surgical History:  Procedure Laterality Date  . ABDOMINAL AORTOGRAM W/LOWER EXTREMITY N/A 09/23/2016   Procedure: Abdominal Aortogram w/Lower Extremity;  Surgeon: Wellington Hampshire, MD;  Location: [place] [facility];  Service: Cardiovascular;  Laterality: N/A;  . CARDIAC CATHETERIZATION  11/07/2008   EF of 50-55% -- normal LV systolic function, acute inferolateral ST elevation MI,  PTCA of the circumflex artery -- [name]. [name], [name]  . CARDIAC CATHETERIZATION N/A 07/07/2016   Procedure: Left Heart Cath and Cors/Grafts Angiography;  Surgeon: [name], MD;  Location: [place] [facility];  Service: Cardiovascular;  Laterality: N/A;  . CARDIAC CATHETERIZATION N/A 07/07/2016   Procedure: Coronary Stent Intervention;  Surgeon: [name], MD;  Location: [place] [facility];  Service: Cardiovascular;  Laterality: N/A;  . CORONARY ANGIOPLASTY    . CORONARY ARTERY BYPASS GRAFT     "CABG X4"  . LEFT HEART CATH AND CORS/GRAFTS ANGIOGRAPHY N/A 10/01/2017   Procedure: LEFT HEART CATH AND CORS/GRAFTS ANGIOGRAPHY;  Surgeon: Burnell Blanks, MD;  Location: [place] [facility];  Service: Cardiovascular;  Laterality: N/A;  . LEFT HEART CATHETERIZATION WITH CORONARY/GRAFT ANGIOGRAM N/A 05/15/2013   Procedure: LEFT HEART CATHETERIZATION WITH Beatrix Fetters;  Surgeon: [name], MD;  Location: [facility];  Service: Cardiovascular;  Laterality: N/A;  . PERIPHERAL VASCULAR INTERVENTION  09/23/2016   Procedure: Peripheral Vascular Intervention;  Surgeon: Wellington Hampshire, MD;  Location: [place] [facility];  Service: Cardiovascular;;  Lt. SFA    Current Outpatient Medications  Medication Sig Dispense Refill  . albuterol (VENTOLIN HFA) 108 (90 Base) MCG/ACT inhaler Inhale 2 puffs into the lungs every 6 (six) hours as needed for wheezing or shortness of breath. 54 g 3  . aspirin EC 81 MG EC tablet Take 1 tablet (81 mg total) by mouth daily with  breakfast. 30 tablet 2  . atorvastatin (LIPITOR) 80 MG tablet Take 1 tablet (80 mg total) by mouth daily at 6 PM. 90 tablet 3  . augmented betamethasone dipropionate (DIPROLENE-AF) 0.05 % cream Apply topically 2 (two) times daily. (Patient not taking: Reported on 10/22/2020) 60 g 0  . budesonide-formoterol (SYMBICORT) 160-4.5 MCG/ACT inhaler Inhale 2 puffs into the lungs 2 (two) times daily. 1 each 3  . clopidogrel (PLAVIX) 75 MG tablet Take 1 tablet (75 mg total) by mouth daily. 90 tablet 3  . fenofibrate (TRICOR) 145 MG tablet Take 1 tablet (145 mg total) by mouth daily. 30 tablet 3  . folic acid (FOLVITE) 1 MG tablet Take 1 tablet (1 mg total) by mouth daily. 90 tablet 3  . isosorbide mononitrate (IMDUR) 60 MG 24 hr tablet TAKE 1 TABLET EVERY DAY 90 tablet 1  . lisinopril (ZESTRIL) 10 MG tablet TAKE 1 TABLET EVERY DAY (DOSE INCREASE) 90 tablet 2  .  metFORMIN (GLUCOPHAGE) 500 MG tablet Take 1 tablet (500 mg total) by mouth 2 (two) times daily with a meal. 60 tablet 3  . metoprolol tartrate (LOPRESSOR) 25 MG tablet Take 1 tablet (25 mg total) by mouth 2 (two) times daily. 180 tablet 3  . Multiple Vitamin (MULTIVITAMIN WITH MINERALS) TABS tablet Take 1 tablet by mouth daily. 90 tablet 2  . nitroGLYCERIN (NITROSTAT) 0.4 MG SL tablet Place 1 tablet (0.4 mg total) under the tongue every 5 (five) minutes x 3 doses as needed for chest pain. (Patient not taking: Reported on 10/22/2020) 25 tablet 2  . pantoprazole (PROTONIX) 40 MG tablet Take 1 tablet (40 mg total) by mouth 2 (two) times daily. 60 tablet 6  . thiamine 100 MG tablet Take 1 tablet (100 mg total) by mouth daily. 90 tablet 3  . traZODone (DESYREL) 50 MG tablet Take 1 tablet (50 mg total) by mouth at bedtime. 30 tablet 1   No current facility-administered medications for this visit.    No Known Allergies  Social History   Socioeconomic History  . Marital status: Single    Spouse name: Not on file  . Number of children: 2  . Years of  education: Not on file  . Highest education level: Not on file  Occupational History  . Occupation: not working , used to be a Chief Executive Officer   . Occupation: disable  Tobacco Use  . Smoking status: Current Every Day Smoker    Packs/day: 1.00    Years: 43.00    Pack years: 43.00    Types: Cigarettes    Start date: 62  . Smokeless tobacco: Never Used  Vaping Use  . Vaping Use: Never used  Substance and Sexual Activity  . Alcohol use: Yes    Alcohol/week: 14.0 standard drinks    Types: 14 Cans of beer per week    Comment: as off 12/2017: beer 12-pack q week  . Drug use: No    Comment: 09/23/2016 "none in years"  . Sexual activity: Not Currently  Other Topics Concern  . Not on file  Social History Narrative   Lives by himself   Social Determinants of Health   Financial Resource Strain: Not on file  Food Insecurity: Not on file  Transportation Needs: Not on file  Physical Activity: Not on file  Stress: Not on file  Social Connections: Not on file  Intimate Partner Violence: Not on file    Family History  Problem Relation Age of Onset  . Heart disease Mother   . Heart failure Mother   . Diabetes Mother   . Colon cancer Father        late 32s  . Prostate cancer Neg Hx     Review of Systems:  As stated in the HPI and otherwise negative.   There were no vitals taken for this visit.  Physical Examination:  General: Well developed, well nourished, NAD  HEENT: OP clear, mucus membranes moist  SKIN: warm, dry. No rashes. Neuro: No focal deficits  Musculoskeletal: Muscle strength 5/5 all ext  Psychiatric: Mood and affect normal  Neck: No JVD, no carotid bruits, no thyromegaly, no lymphadenopathy.  Lungs:Clear bilaterally, no wheezes, rhonci, crackles Cardiovascular: Regular rate and rhythm. No murmurs, gallops or rubs. Abdomen:Soft. Bowel sounds present. Non-tender.  Extremities: No lower extremity edema. Pulses are 2 + in the bilateral DP/PT.  Echo  02/21/15: Procedure narrative: Transthoracic echocardiography. Image quality was adequate. The study was technically difficult due to variations in  respirations. - Left ventricle: Distal septal, apical and inferobasal hypokinesis The cavity size was normal. Wall thickness was increased in a pattern of mild LVH. Systolic function was normal. The estimated ejection fraction was in the range of 50% to 55%. - Mitral valve: There was mild regurgitation. - Left atrium: The atrium was mildly dilated. - Atrial septum: No defect or patent foramen ovale was identified.  EKG:  EKG is ordered today. The ekg ordered today demonstrates sinus, Chronic inferior ST elevation. No change since last EKG  Recent Labs: 12/27/2019: Hemoglobin 15.5; Platelets 221.0 07/10/2020: ALT 13; BUN 11; Creatinine, Ser 0.83; Potassium 4.2; Sodium 134   Lipid Panel    Component Value Date/Time   CHOL 171 11/14/2019 1441   TRIG 97 11/14/2019 1441   HDL 37 (L) 11/14/2019 1441   CHOLHDL 4.6 11/14/2019 1441   CHOLHDL 4.3 11/27/2018 0441   VLDL 35 11/27/2018 0441   LDLCALC 116 (H) 11/14/2019 1441   LDLDIRECT 116.0 05/12/2013 1517     Wt Readings from Last 3 Encounters:  10/22/20 226 lb 8 oz (102.7 kg)  07/10/20 234 lb 6.4 oz (106.3 kg)  04/22/20 230 lb 6.4 oz (104.5 kg)     Other studies Reviewed: Additional studies/ records that were reviewed today include: . Review of the above records demonstrates:    Assessment and Plan:   1. CAD without angina: He has no chest pain. He is s/p 3V CABG in March 2010 and has had bare metal stent to SVG to Circumflex in November 2010 as well as DES placed in the body of the SVG to OM in November 2017 in the setting of lateral STEMI. LV function is normal by LV gram 2020.  Continue ASA, Plavix, statin and beta blocker.    2. HTN: BP is controlled. No changes  3. PAD: Followed in [place] clinic by Dr. Fletcher Anon.   4. Hyperlipidemia: LDL in 2021 not at goal. ***  Current  medicines are reviewed at length with the patient today.  The patient does not have concerns regarding medicines.  The following changes have been made:  no change  Labs/ tests ordered today include:   No orders of the defined types were placed in this encounter.   Disposition:   FU with me in 12  months  Signed, [name], MD 10/28/2020 9:24 AM    [facility] [place], [place], [place]  [address] Phone: [phone]; Fax: [phone]

## 2020-10-31 ENCOUNTER — Telehealth: Payer: Self-pay | Admitting: Internal Medicine

## 2020-10-31 NOTE — Telephone Encounter (Signed)
Patient states he is having diarrhea not sure which medicine is causing it

## 2020-10-31 NOTE — Telephone Encounter (Signed)
Diarrhea started 3/18 since starting new medications (metformin 500mg  bid, and trazodone 50mg ) , getting worse states its all water, he denies fever, worsening cough or other URI symptoms, chills, has tried [employer] several times but has not worked.

## 2020-10-31 NOTE — Telephone Encounter (Signed)
Spoke w/ Pt- informed of recommendations. Pt verbalized understanding.  

## 2020-10-31 NOTE — Telephone Encounter (Signed)
It is likely Metformin. Recommend the following:  Stop Metformin for 4 days. Then restart Metformin 500 mg: Half tablet daily for 1 week, then half tablet twice a day.  To let us know how he is doing. If he continues with diarrhea, will switch to another medication

## 2020-11-01 ENCOUNTER — Ambulatory Visit: Payer: Medicare HMO | Admitting: Internal Medicine

## 2020-11-01 ENCOUNTER — Encounter: Payer: Self-pay | Admitting: Internal Medicine

## 2020-11-05 ENCOUNTER — Telehealth: Payer: Self-pay | Admitting: Internal Medicine

## 2020-11-05 NOTE — Telephone Encounter (Signed)
Medication: budesonide-formoterol (SYMBICORT) 160-4.5 MCG/ACT inhaler [[id number]]       Has the patient contacted their pharmacy?  (If no, request that the patient contact the pharmacy for the refill.) (If yes, when and what did the pharmacy advise?)     Preferred Pharmacy (with phone number or street name):  [place], [place] Phone:  [phone]  Fax:  [phone]          Agent: Please be advised that RX refills may take up to 3 business days. We ask that you follow-up with your pharmacy.

## 2020-11-06 NOTE — Telephone Encounter (Signed)
Symbicort was refilled locally on 10/22/20 for 3 months as requested by Pt.

## 2020-11-07 ENCOUNTER — Other Ambulatory Visit: Payer: Self-pay

## 2020-11-07 ENCOUNTER — Ambulatory Visit: Payer: Medicare HMO | Admitting: Internal Medicine

## 2020-11-07 ENCOUNTER — Encounter: Payer: Self-pay | Admitting: Internal Medicine

## 2020-11-07 ENCOUNTER — Ambulatory Visit (INDEPENDENT_AMBULATORY_CARE_PROVIDER_SITE_OTHER): Payer: Medicare HMO

## 2020-11-07 DIAGNOSIS — I10 Essential (primary) hypertension: Secondary | ICD-10-CM

## 2020-11-07 DIAGNOSIS — R0600 Dyspnea, unspecified: Secondary | ICD-10-CM

## 2020-11-07 DIAGNOSIS — R0609 Other forms of dyspnea: Secondary | ICD-10-CM

## 2020-11-07 DIAGNOSIS — R059 Cough, unspecified: Secondary | ICD-10-CM | POA: Diagnosis not present

## 2020-11-07 DIAGNOSIS — I517 Cardiomegaly: Secondary | ICD-10-CM | POA: Diagnosis not present

## 2020-11-07 MED ORDER — VALSARTAN 80 MG PO TABS: 80.0000 mg | ORAL_TABLET | Freq: Every day | ORAL | 11 refills | Status: DC

## 2020-11-07 NOTE — Assessment & Plan Note (Addendum)
Echo 11/27/2018 The left ventricle has normal systolic function with an ejection  fraction of 60-65%. The cavity size was normal. There is mildly increased  left ventricular wall thickness. Left ventricular diastolic parameters  were normal.  2. Inferobasal hypokinesis.  3. The right ventricle has normal systolic function. The cavity was  normal. There is no increase in right ventricular wall thickness.  4. Left atrial size was moderately dilated.  5. Mild thickening of the mitral valve leaflet. Mild calcification of the  mitral valve leaflet.  6. The tricuspid valve is grossly normal.  7. The aortic valve was not well visualized. Moderate thickening of the  aortic valve. Sclerosis without any evidence of stenosis of the aortic  valve. No stenosis of the aortic valve.  - Try off acei 11/07/2020  See hbp   Not clear at all how much true airways dz he has vs all pseudowheeze but clearly obesity and chf are major contributors as well as conditioning > start with   1) - The proper method of use, as well as anticipated side effects, of a metered-dose inhaler were discussed and demonstrated to the patient using teach back method> decrease symb 160 to one bid in case it's the cause of his hoarseness then return for pfts in 6 weeks s symbicort that day   2) ppi bid ac x 6 weeks   3) trial off acei and on ARB x 6 weeks

## 2020-11-07 NOTE — Progress Notes (Signed)
[name]., male    DOB: 1961-07-05,   MRN: [id number]   Brief patient profile:  Seen by [name] 2014 :  OSA  > stopped it on his own   60 yowm quit smoking 10/31/20 referred to pulmonary clinic 11/07/2020 by Dr  [name] for doe/cough/ hoarseness x ??? (pt unable to say)  While on ACEi chronically    History of Present Illness  11/07/2020  Pulmonary/ 1st office eval/[name]  Voice and swallowing s/p ST eval 09/13/2020 mild risk asp Chief Complaint  Patient presents with  . Pulmonary Consult    Referred by Dr [name]. He states he has been told that he had COPD.   Dyspnea:  hc parking but can do even large store if walks slowly   Cough: assoc with choking / strangling / also blacked out from coughing 6 weeks prior to [place] prior to symbicort  With neg eval by [name] 09/03/20   Sleep: on side bed is flat with pillows  SABA use: not sure helping 02 :  3lpm hs/ not using daytime   No obvious day to day or daytime variability or assoc excess/ purulent sputum or mucus plugs or hemoptysis or cp or chest tightness, subjective wheeze or overt sinus or hb symptoms.   Sleeping  without nocturnal  or early am exacerbation  of respiratory  c/o's or need for noct saba. Also denies any obvious fluctuation of symptoms with weather or environmental changes or other aggravating or alleviating factors except as outlined above   No unusual exposure hx or h/o childhood pna/ asthma or knowledge of premature birth.  Current Allergies, Complete Past Medical History, Past Surgical History, Family History, and Social History were reviewed in [employer] record.  ROS  The following are not active complaints unless bolded Hoarseness, sore throat, dysphagia, dental problems, itching, sneezing,  nasal congestion or discharge of excess mucus or purulent secretions, ear ache,   fever, chills, sweats, unintended wt loss or wt gain, classically pleuritic or exertional cp,  orthopnea pnd or arm/hand  swelling  or leg swelling, presyncope, palpitations, abdominal pain, anorexia, nausea, vomiting, diarrhea  or change in bowel habits or change in bladder habits, change in stools or change in urine, dysuria, hematuria,  rash, arthralgias, visual complaints, headache, numbness, weakness or ataxia or problems with walking or coordination,  change in mood or  memory.           Past Medical History:  Diagnosis Date  . Abnormality of gait   . Anxiety   . CAD (coronary artery disease)    a. 10/2008 Inferolateral STEMI: 3VD w/ occluded LCX (PTCA)-->CABG x 3 (LIMA->LAD, VG->OM, VG->PDA);  b. 06/2009 NSTEMI/PCI: VG->OM 100 (BMS);  c. 05/2013 native 3VD, 3/3 patent grafts. d. 06/2016: Inferior STEMI w/ occluded SVG-OM (DES placed). LIMA-LAD and SVG-PDA patent.  . Chronic bronchitis ([place])   . Chronic diastolic CHF (congestive heart failure) ([place])    a. 02/2015 Echo: EF 50-55%, distal septal, apical, inferobasal HK, mild LVH, mild MR, mildly dil LA.  [name] Cocaine use   . Colon polyps    a. 09/2015 colonscopy: multiple sessile polyps, path negative for high grade dysplasia.  . Depression   . Emphysema (subcutaneous) (surgical) resulting from a procedure 10/2008.   Bleb resected at time of 10/2008 CABG. Marketed emphysema noted on surgical report.  . Esophagitis    a. 2017 EGD: esophagitis, duodenitis.  [name] ETOH abuse   . GERD (gastroesophageal reflux disease)   .  Hemianopia, homonymous, right    "can't see out the right side of either eye since stroke in 2016/pt description 09/23/2016  . Hepatic steatosis   . Hiatal hernia   . History of nuclear stress test 08/2017   Nuclear stress test 1/19: EF 35, inf-lat and ant-lat, apical sept/apical scar; no ischemia; Intermediate Risk  . Hyperlipidemia   . Hypertension   . Iron deficiency anemia 2010  . Morbid obesity ([place])   . Myocardial infarction ([place]) "several"  . PAD (peripheral artery disease) ([place])   . Pancreatitis 06/2015  . Sleep apnea    "don't have a  mask" (09/23/2016)  . Stroke ([place]) 02/2015   a. 02/2015 Carotid U/S: 1-39% bilat ICA stenosis; "left me blind" (09/23/2016)  . Tobacco abuse    still smokes  . Tubular adenoma of colon     Outpatient Medications Prior to Visit  Medication Sig Dispense Refill  . albuterol (VENTOLIN HFA) 108 (90 Base) MCG/ACT inhaler Inhale 2 puffs into the lungs every 6 (six) hours as needed for wheezing or shortness of breath. 54 g 3  . aspirin EC 81 MG EC tablet Take 1 tablet (81 mg total) by mouth daily with breakfast. 30 tablet 2  . atorvastatin (LIPITOR) 80 MG tablet Take 1 tablet (80 mg total) by mouth daily at 6 PM. 90 tablet 3  . budesonide-formoterol (SYMBICORT) 160-4.5 MCG/ACT inhaler Inhale 2 puffs into the lungs 2 (two) times daily. 1 each 3  . clopidogrel (PLAVIX) 75 MG tablet Take 1 tablet (75 mg total) by mouth daily. 90 tablet 3  . fenofibrate (TRICOR) 145 MG tablet Take 1 tablet (145 mg total) by mouth daily. 30 tablet 3  . folic acid (FOLVITE) 1 MG tablet Take 1 tablet (1 mg total) by mouth daily. 90 tablet 3  . isosorbide mononitrate (IMDUR) 60 MG 24 hr tablet TAKE 1 TABLET EVERY DAY 90 tablet 1  . lisinopril (ZESTRIL) 10 MG tablet TAKE 1 TABLET EVERY DAY (DOSE INCREASE) 90 tablet 2  . metoprolol tartrate (LOPRESSOR) 25 MG tablet Take 1 tablet (25 mg total) by mouth 2 (two) times daily. 180 tablet 3  . Multiple Vitamin (MULTIVITAMIN WITH MINERALS) TABS tablet Take 1 tablet by mouth daily. 90 tablet 2  . nitroGLYCERIN (NITROSTAT) 0.4 MG SL tablet Place 1 tablet (0.4 mg total) under the tongue every 5 (five) minutes x 3 doses as needed for chest pain. 25 tablet 2  . pantoprazole (PROTONIX) 40 MG tablet Take 1 tablet (40 mg total) by mouth 2 (two) times daily. 60 tablet 6  . thiamine 100 MG tablet Take 1 tablet (100 mg total) by mouth daily. 90 tablet 3  . traZODone (DESYREL) 50 MG tablet Take 1 tablet (50 mg total) by mouth at bedtime. 30 tablet 1  . metFORMIN (GLUCOPHAGE) 500 MG tablet Take 0.5  tablets (250 mg total) by mouth 2 (two) times daily with a meal. (Patient not taking: Reported on 11/07/2020)    . augmented betamethasone dipropionate (DIPROLENE-AF) 0.05 % cream Apply topically 2 (two) times daily. 60 g 0   No facility-administered medications prior to visit.     Objective:     BP 106/78 (BP Location: Left Arm, Cuff Size: Normal)   Pulse 63   Temp (!) 97.4 F (36.3 C) (Temporal)   Ht 5\' 11"  (1.803 m)   Wt 225 lb 6.4 oz (102.2 kg)   SpO2 94% Comment: on RA  BMI 31.44 kg/m   SpO2: 94 % (on RA)  amb wm hoarse with  congested sounding cough and prominent pseudowheeze   HEENT : pt wearing mask not removed for exam due to covid - 19 concerns.   NECK :  without JVD/Nodes/TM/ nl carotid upstrokes bilaterally   LUNGS: no acc muscle use,  Min barrel  contour chest wall with bilateral  slightly decreased bs s audible wheeze and  without cough on insp or exp maneuvers and min  Hyperresonant  to  percussion bilaterally     CV:  RRR  no s3 or murmur or increase in P2, and R > L pitting   ABD: Obese  soft and nontender with pos end  insp Hoover's  in the supine position. No bruits or organomegaly appreciated, bowel sounds nl  MS:   Nl gait/  ext warm without deformities, calf tenderness, cyanosis or clubbing No obvious joint restrictions   SKIN: warm and dry without lesions    NEURO:  alert, approp, nl sensorium with  no motor or cerebellar deficits apparent.       CXR PA and Lateral:   11/07/2020 :    I personally reviewed images and agree with radiology impression as follows:    No significant interval change in cardiomegaly and central airways thickening. No focal consolidative process.      Assessment   DOE (dyspnea on exertion) Echo 11/27/2018 The left ventricle has normal systolic function with an ejection  fraction of 60-65%. The cavity size was normal. There is mildly increased  left ventricular wall thickness. Left ventricular diastolic parameters   were normal.  2. Inferobasal hypokinesis.  3. The right ventricle has normal systolic function. The cavity was  normal. There is no increase in right ventricular wall thickness.  4. Left atrial size was moderately dilated.  5. Mild thickening of the mitral valve leaflet. Mild calcification of the  mitral valve leaflet.  6. The tricuspid valve is grossly normal.  7. The aortic valve was not well visualized. Moderate thickening of the  aortic valve. Sclerosis without any evidence of stenosis of the aortic  valve. No stenosis of the aortic valve.  - Try off acei 11/07/2020  See hbp   Not clear at all how much true airways dz he has vs all pseudowheeze but clearly obesity and chf are major contributors as well as conditioning > start with   1) - The proper method of use, as well as anticipated side effects, of a metered-dose inhaler were discussed and demonstrated to the patient using teach back method> decrease symb 160 to one bid in case it's the cause of his hoarseness then return for pfts in 6 weeks s symbicort that day   2) ppi bid ac x 6 weeks   3) trial off acei and on ARB x 6 weeks       Essential hypertension D/c acei  11/07/2020  Due to hoarseness, cough to point of syncope   In the best review of chronic cough to date ( NEJM 2016 375 4818-5631) ,  ACEi are now felt to cause cough in up to  20% of pts which is a 4 fold increase from previous reports and does not include the variety of non-specific complaints we see in pulmonary clinic in pts on ACEi but previously attributed to another dx like  Copd/asthma and  include PNDS, throat and chest congestion, "bronchitis", unexplained dyspnea and noct "strangling" sensations, and hoarseness, but also  atypical /refractory GERD symptoms like dysphagia and "bad heartburn"   The only way  I know  to prove this is not an "ACEi Case" is a trial off ACEi x a minimum of 6 weeks then regroup.   Try diovan 80 mg one daily and return for  pfts in 6 weeks           Each maintenance medication was reviewed in detail including emphasizing most importantly the difference between maintenance and prns and under what circumstances the prns are to be triggered using an action plan format where appropriate.  Total time for H and P, chart review, counseling, reviewing hfa device(s) and generating customized AVS unique to this office visit / same day charting = 45 min           Christinia Gully, MD 11/07/2020

## 2020-11-07 NOTE — Assessment & Plan Note (Signed)
D/c acei  11/07/2020  Due to hoarseness, cough to point of syncope   In the best review of chronic cough to date ( NEJM 2016 375 2548-6282) ,  ACEi are now felt to cause cough in up to  20% of pts which is a 4 fold increase from previous reports and does not include the variety of non-specific complaints we see in pulmonary clinic in pts on ACEi but previously attributed to another dx like  Copd/asthma and  include PNDS, throat and chest congestion, "bronchitis", unexplained dyspnea and noct "strangling" sensations, and hoarseness, but also  atypical /refractory GERD symptoms like dysphagia and "bad heartburn"   The only way I know  to prove this is not an "ACEi Case" is a trial off ACEi x a minimum of 6 weeks then regroup.   Try diovan 80 mg one daily and return for pfts in 6 weeks           Each maintenance medication was reviewed in detail including emphasizing most importantly the difference between maintenance and prns and under what circumstances the prns are to be triggered using an action plan format where appropriate.  Total time for H and P, chart review, counseling, reviewing hfa device(s) and generating customized AVS unique to this office visit / same day charting = 45 min

## 2020-11-07 NOTE — Patient Instructions (Addendum)
Change protonix (pantoprazole)  to 40 mg Take 30- 60 min before your first and last meals of the day   GERD (REFLUX)  is an extremely common cause of respiratory symptoms just like yours , many times with no obvious heartburn at all.    It can be treated with medication, but also with lifestyle changes including elevation of the head of your bed (ideally with 6 -8inch blocks under the headboard of your bed),  Smoking cessation, avoidance of late meals, excessive alcohol, and avoid fatty foods, chocolate, peppermint, colas, red wine, and acidic juices such as orange juice.  NO MINT OR MENTHOL PRODUCTS SO NO COUGH DROPS  USE SUGARLESS CANDY INSTEAD (Jolley ranchers or Stover's or Life Savers) or even ice chips will also do - the key is to swallow to prevent all throat clearing. NO OIL BASED VITAMINS - use powdered substitutes.  Avoid fish oil when coughing.     Stop lisinopril and start diovan 80 mg one daily   Plan A = Automatic = Always=   Change symbicort 160 Take 1 puffs first thing in am and then another 2 puffs about 12 hours later.   Work on inhaler technique:  relax and gently blow all the way out then take a nice smooth deep breath back in, triggering the inhaler at same time you start breathing in.  Hold for up to 5 seconds if you can. Blow out thru nose. Rinse and gargle with water when done  Plan B = Backup (to supplement plan A, not to replace it) Only use your albuterol inhaler as a rescue medication to be used if you can't catch your breath by resting or doing a relaxed purse lip breathing pattern.  - The less you use it, the better it will work when you need it. - Ok to use the inhaler up to 2 puffs  every 4 hours if you must but call for appointment if use goes up over your usual need - Don't leave home without it !!  (think of it like the spare tire for your car)    Please remember to go to the  x-ray department  for your tests - we will call you with the results when they are  available     Please schedule a follow up office visit in 6 weeks, call sooner if needed with all medications /inhalers/ solutions in hand so we can verify exactly what you are taking. This includes all medications from all doctors and over the counters  - PFTs on return - don't take the symbicort that day

## 2020-11-21 ENCOUNTER — Telehealth: Payer: Self-pay | Admitting: Internal Medicine

## 2020-11-21 NOTE — Telephone Encounter (Signed)
Pt overdue for f/u with PCP- he was supposed to f/u at end of March. Please schedule for next week.

## 2020-11-21 NOTE — Telephone Encounter (Signed)
Medication: budesonide-formoterol (SYMBICORT) 160-4.5 MCG/ACT inhaler [[id number]]   traZODone (DESYREL) 50 MG tablet [[id number]]    Has the patient contacted their pharmacy? No. (If no, request that the patient contact the pharmacy for the refill.) (If yes, when and what did the pharmacy advise?)  Preferred Pharmacy (with phone number or street name):  [facility], [place] Phone:  [phone]  Fax:  [phone]       Agent: Please be advised that RX refills may take up to 3 business days. We ask that you follow-up with your pharmacy.

## 2020-11-24 ENCOUNTER — Other Ambulatory Visit: Payer: Self-pay | Admitting: Cardiovascular Disease

## 2020-11-25 ENCOUNTER — Other Ambulatory Visit: Payer: Self-pay | Admitting: Internal Medicine

## 2020-11-25 ENCOUNTER — Other Ambulatory Visit: Payer: Self-pay | Admitting: Cardiovascular Disease

## 2020-11-26 NOTE — Telephone Encounter (Signed)
Called patient LVM to return call

## 2020-11-26 NOTE — Telephone Encounter (Signed)
Follow Up:     Pt said he received notice from [facility] that his medicine was denied. He said he did not know what medicine it was.

## 2020-11-27 ENCOUNTER — Other Ambulatory Visit: Payer: Self-pay | Admitting: Internal Medicine

## 2020-11-27 MED ORDER — VALSARTAN 80 MG PO TABS: 80.0000 mg | ORAL_TABLET | Freq: Every day | ORAL | 3 refills | Status: DC

## 2020-12-07 ENCOUNTER — Other Ambulatory Visit: Payer: Self-pay | Admitting: Cardiovascular Disease

## 2020-12-13 ENCOUNTER — Other Ambulatory Visit: Payer: Self-pay

## 2020-12-13 MED ORDER — FENOFIBRATE 145 MG PO TABS: 145.0000 mg | ORAL_TABLET | Freq: Every day | ORAL | 0 refills | Status: DC

## 2020-12-13 MED ORDER — TRAZODONE HCL 50 MG PO TABS
50.0000 mg | ORAL_TABLET | Freq: Every day | ORAL | 0 refills | Status: DC
Start: 2020-12-13 — End: 2021-03-10

## 2020-12-14 NOTE — Progress Notes (Deleted)
Cardiology Office Note   Date:  12/14/2020   ID:  Kenneth Hareharles L Saephan Jr., DOB 1961/07/19, MRN [id number]  PCP:  Wanda PlumpPaz, Jose E, MD  Cardiologist: Dr. Clifton JamesMcAlhany, MD  No chief complaint on file.     History of Present Illness: Kenneth HareCharles L Hayton Jr. is a 60 y.o. male who presents for follow-up, seen for Dr. Clifton JamesMcAlhany.   Mr. Gaylyn [name] has a history of HTN, HLD, tobacco and former cocaine abuse, PAD and CAD.  He initially presented with an inferior lateral STEMI 10/2008 at which time his LCx was occluded and was opened with avoid angioplasty.  He ultimately underwent emergent three-vessel CABG with LIMA to LAD, SVG to circumflex and SVG to PDA given diffuse multivessel disease.  He had a non-STEMI in 06/2009 at which time he had a bare-metal stent to a totally occluded SVG to OM.  Repeat cardiac catheterization 05/2013 with patent bypass grafts.  He was admitted 02/2015 with a CVA at which time an echocardiogram was performed which showed normal LV function.  Carotid Dopplers showed mild bilateral carotid artery disease.  He was admitted once again 06/2016 with a lateral STEMI secondary to an acute occlusion of the SVG to OM at which time a Synergy DES was placed.  He had dyspnea with Brilinta therefore he was discharged on ASA and Plavix.  Lower extremity angiogram performed 09/23/2016 after referral to Dr. Kary KosArita for PVD work-up showed an occluded left SFA that was treated with arthrectomy, balloon angioplasty and stenting.  He had moderate disease in the right SFA.  Repeat LHC 09/2017 showed occlusion of all native vessels with patent grafts.  He had a second acute CVA 11/2018 at which time he refused rehab and signed out AMA.  Prior to most recent office visit he was seen at [facility] emergency department with weakness>> CT head imaging with no acute abnormalities.  Troponin levels found to be 18.  He was ultimately discharged from the ED with no further work-up.     He was then seen 04/22/2020 in follow-up  with Dr. Clifton JamesMcAlhany at which time he had no CV complaints  1.  CAD without angina: -s/p 5 vessel CABG 10/2008 with BMS to SVG to circumflex 06/2009 along with DES to SVG to OM body 06/2016 in the setting of lateral STEMI -Continue ASA, Plavix, statin and beta-blocker    2.  HTN: -Stable,   3.  PAD: -Follows with Dr. Hilary Hertzeda -Continue ASA, Plavix and statin  4.  Tobacco use: -Cessation strongly encouraged  5.  History of CVA: -No recent neurological changes -Continue current regimen  Past Medical History:  Diagnosis Date  . Abnormality of gait   . Anxiety   . CAD (coronary artery disease)    a. 10/2008 Inferolateral STEMI: 3VD w/ occluded LCX (PTCA)-->CABG x 3 (LIMA->LAD, VG->OM, VG->PDA);  b. 06/2009 NSTEMI/PCI: VG->OM 100 (BMS);  c. 05/2013 native 3VD, 3/3 patent grafts. d. 06/2016: Inferior STEMI w/ occluded SVG-OM (DES placed). LIMA-LAD and SVG-PDA patent.  . Chronic bronchitis (HCC)   . Chronic diastolic CHF (congestive heart failure) (HCC)    a. 02/2015 Echo: EF 50-55%, distal septal, apical, inferobasal HK, mild LVH, mild MR, mildly dil LA.  [name]. Cocaine use   . Colon polyps    a. 09/2015 colonscopy: multiple sessile polyps, path negative for high grade dysplasia.  . Depression   . Emphysema (subcutaneous) (surgical) resulting from a procedure 10/2008.   Bleb resected at time of 10/2008 CABG. Marketed emphysema noted on  surgical report.  . Esophagitis    a. 2017 EGD: esophagitis, duodenitis.  [name] ETOH abuse   . GERD (gastroesophageal reflux disease)   . Hemianopia, homonymous, right    "can't see out the right side of either eye since stroke in 2016/pt description 09/23/2016  . Hepatic steatosis   . Hiatal hernia   . History of nuclear stress test 08/2017   Nuclear stress test 1/19: EF 35, inf-lat and ant-lat, apical sept/apical scar; no ischemia; Intermediate Risk  . Hyperlipidemia   . Hypertension   . Iron deficiency anemia 2010  . Morbid obesity ([place])   . Myocardial  infarction ([place]) "several"  . PAD (peripheral artery disease) ([place])   . Pancreatitis 06/2015  . Sleep apnea    "don't have a mask" (09/23/2016)  . Stroke ([place]) 02/2015   a. 02/2015 Carotid U/S: 1-39% bilat ICA stenosis; "left me blind" (09/23/2016)  . Tobacco abuse    still smokes  . Tubular adenoma of colon     Past Surgical History:  Procedure Laterality Date  . ABDOMINAL AORTOGRAM W/LOWER EXTREMITY N/A 09/23/2016   Procedure: Abdominal Aortogram w/Lower Extremity;  Surgeon: Wellington Hampshire, MD;  Location: [place] [facility];  Service: Cardiovascular;  Laterality: N/A;  . CARDIAC CATHETERIZATION  11/07/2008   EF of 50-55% -- normal LV systolic function, acute inferolateral ST elevation MI,  PTCA of the circumflex artery -- [name]. [name], [name]  . CARDIAC CATHETERIZATION N/A 07/07/2016   Procedure: Left Heart Cath and Cors/Grafts Angiography;  Surgeon: [name], MD;  Location: [place] [facility];  Service: Cardiovascular;  Laterality: N/A;  . CARDIAC CATHETERIZATION N/A 07/07/2016   Procedure: Coronary Stent Intervention;  Surgeon: [name], MD;  Location: [place] [facility];  Service: Cardiovascular;  Laterality: N/A;  . CORONARY ANGIOPLASTY    . CORONARY ARTERY BYPASS GRAFT     "CABG X4"  . LEFT HEART CATH AND CORS/GRAFTS ANGIOGRAPHY N/A 10/01/2017   Procedure: LEFT HEART CATH AND CORS/GRAFTS ANGIOGRAPHY;  Surgeon: Burnell Blanks, MD;  Location: [place] [facility];  Service: Cardiovascular;  Laterality: N/A;  . LEFT HEART CATHETERIZATION WITH CORONARY/GRAFT ANGIOGRAM N/A 05/15/2013   Procedure: LEFT HEART CATHETERIZATION WITH Beatrix Fetters;  Surgeon: [name], MD;  Location: [facility];  Service: Cardiovascular;  Laterality: N/A;  . PERIPHERAL VASCULAR INTERVENTION  09/23/2016   Procedure: Peripheral Vascular Intervention;  Surgeon: Wellington Hampshire, MD;  Location: [place] [facility];  Service: Cardiovascular;;  Lt. SFA     Current Outpatient  Medications  Medication Sig Dispense Refill  . albuterol (VENTOLIN HFA) 108 (90 Base) MCG/ACT inhaler Inhale 2 puffs into the lungs every 6 (six) hours as needed for wheezing or shortness of breath. 54 g 3  . aspirin EC 81 MG EC tablet Take 1 tablet (81 mg total) by mouth daily with breakfast. 30 tablet 2  . atorvastatin (LIPITOR) 80 MG tablet Take 1 tablet (80 mg total) by mouth daily at 6 PM. Patient needs to keep appointment for any future refills. 90 tablet 0  . budesonide-formoterol (SYMBICORT) 160-4.5 MCG/ACT inhaler Inhale 2 puffs into the lungs 2 (two) times daily. 1 each 3  . clopidogrel (PLAVIX) 75 MG tablet TAKE 1 TABLET (75 MG TOTAL) BY MOUTH DAILY. 90 tablet 1  . fenofibrate (TRICOR) 145 MG tablet Take 1 tablet (145 mg total) by mouth daily. 90 tablet 0  . folic acid (FOLVITE) 1 MG tablet Take 1 tablet (1 mg total) by  mouth daily. 90 tablet 3  . isosorbide mononitrate (IMDUR) 60 MG 24 hr tablet TAKE 1 TABLET EVERY DAY 90 tablet 1  . metFORMIN (GLUCOPHAGE) 500 MG tablet Take 1 tablet (500 mg total) by mouth 2 (two) times daily with a meal. 180 tablet 0  . metoprolol tartrate (LOPRESSOR) 25 MG tablet TAKE 1 TABLET TWICE DAILY 180 tablet 1  . Multiple Vitamin (MULTIVITAMIN WITH MINERALS) TABS tablet Take 1 tablet by mouth daily. 90 tablet 2  . nitroGLYCERIN (NITROSTAT) 0.4 MG SL tablet Place 1 tablet (0.4 mg total) under the tongue every 5 (five) minutes x 3 doses as needed for chest pain. 25 tablet 2  . pantoprazole (PROTONIX) 40 MG tablet TAKE 1 TABLET TWICE DAILY 180 tablet 1  . thiamine 100 MG tablet Take 1 tablet (100 mg total) by mouth daily. 90 tablet 3  . traZODone (DESYREL) 50 MG tablet Take 1 tablet (50 mg total) by mouth at bedtime. 90 tablet 0  . valsartan (DIOVAN) 80 MG tablet Take 1 tablet (80 mg total) by mouth daily. 90 tablet 3   No current facility-administered medications for this visit.    Allergies:   Patient has no known allergies.    Social History:  The  patient  reports that he quit smoking about 6 weeks ago. His smoking use included cigarettes. He has a 90.00 pack-year smoking history. He has never used smokeless tobacco. He reports current alcohol use of about 14.0 standard drinks of alcohol per week. He reports that he does not use drugs.   Family History:  The patient's ***family history includes Colon cancer in his father; Diabetes in his mother; Heart disease in his mother; Heart failure in his mother.    ROS:  Please see the history of present illness.   Otherwise, review of systems are positive for {NONE DEFAULTED:18576::"none"}.   All other systems are reviewed and negative.    PHYSICAL EXAM: VS:  There were no vitals taken for this visit. , BMI There is no height or weight on file to calculate BMI. GEN: Well nourished, well developed, in no acute distress HEENT: normal Neck: no JVD, carotid bruits, or masses Cardiac: ***RRR; no murmurs, rubs, or gallops,no edema  Respiratory:  clear to auscultation bilaterally, normal work of breathing GI: soft, nontender, nondistended, + BS MS: no deformity or atrophy Skin: warm and dry, no rash Neuro:  Strength and sensation are intact Psych: euthymic mood, full affect   EKG:  EKG {ACTION; IS/IS NIO:27035009} ordered today. The ekg ordered today demonstrates ***   Recent Labs: 12/27/2019: Hemoglobin 15.5; Platelets 221.0 07/10/2020: ALT 13; BUN 11; Creatinine, Ser 0.83; Potassium 4.2; Sodium 134    Lipid Panel    Component Value Date/Time   CHOL 171 11/14/2019 1441   TRIG 97 11/14/2019 1441   HDL 37 (L) 11/14/2019 1441   CHOLHDL 4.6 11/14/2019 1441   CHOLHDL 4.3 11/27/2018 0441   VLDL 35 11/27/2018 0441   LDLCALC 116 (H) 11/14/2019 1441   LDLDIRECT 116.0 05/12/2013 1517      Wt Readings from Last 3 Encounters:  11/07/20 225 lb 6.4 oz (102.2 kg)  10/22/20 226 lb 8 oz (102.7 kg)  07/10/20 234 lb 6.4 oz (106.3 kg)      Other studies Reviewed: Additional studies/ records  that were reviewed today include: ***. Review of the above records demonstrates: ***  Echocardiogram 11/27/2018:     1. The left ventricle has normal systolic function with an ejection  fraction of 60-65%.  The cavity size was normal. There is mildly increased  left ventricular wall thickness. Left ventricular diastolic parameters  were normal.  2. Inferobasal hypokinesis.  3. The right ventricle has normal systolic function. The cavity was  normal. There is no increase in right ventricular wall thickness.  4. Left atrial size was moderately dilated.  5. Mild thickening of the mitral valve leaflet. Mild calcification of the  mitral valve leaflet.  6. The tricuspid valve is grossly normal.  7. The aortic valve was not well visualized. Moderate thickening of the  aortic valve. Sclerosis without any evidence of stenosis of the aortic  valve. No stenosis of the aortic valve.    LHC 10/01/2017:    Ost LM to Mid LM lesion is 20% stenosed.  Prox LAD lesion is 100% stenosed.  LIMA graft was visualized by angiography and is normal in caliber.  Prox Cx lesion is 100% stenosed.  SVG graft was visualized by angiography and is normal in caliber.  Previously placed [place] to Prox Graft stent (unknown type) is widely patent.  Prox RCA lesion is 100% stenosed.  SVG graft was visualized by angiography and is normal in caliber.   1. Severe triple vessel CAD with occlusion of all three native vessels. He is s/p 3v CABG and all 3 vein grafts are patent.  2. Chronic mid LAD occlusion. LIMA to LAD is patent 3. Chronic mid Circumflex occlusion. SVG to OM is patent 4. Chronic proximal RCA occlusion. SVG to PDA is patent.   Recommendations: Continue medical management of CAD. Smoking cessation.     ASSESSMENT AND PLAN:  1.  ***   Current medicines are reviewed at length with the patient today.  The patient {ACTIONS; HAS/DOES NOT HAVE:19233} concerns regarding medicines.  The  following changes have been made:  {PLAN; NO CHANGE:13088:s}  Labs/ tests ordered today include: *** No orders of the defined types were placed in this encounter.    Disposition:   FU with *** in {gen number [id number]} {Days to years:10300}  Signed, Kathyrn Drown, NP  12/14/2020 [facility] [address], [place], [place]  [address] Phone: [phone]; Fax: [phone]

## 2020-12-16 ENCOUNTER — Other Ambulatory Visit: Payer: Self-pay

## 2020-12-16 ENCOUNTER — Ambulatory Visit: Payer: Medicare HMO | Admitting: Physician Assistant

## 2020-12-16 ENCOUNTER — Encounter: Payer: Self-pay | Admitting: Physician Assistant

## 2020-12-16 ENCOUNTER — Telehealth: Payer: Self-pay

## 2020-12-16 ENCOUNTER — Ambulatory Visit: Payer: Medicare HMO | Admitting: Cardiology

## 2020-12-16 VITALS — BP 140/80 | HR 76 | Ht 71.0 in | Wt 229.8 lb

## 2020-12-16 DIAGNOSIS — Z720 Tobacco use: Secondary | ICD-10-CM | POA: Diagnosis not present

## 2020-12-16 DIAGNOSIS — R600 Localized edema: Secondary | ICD-10-CM

## 2020-12-16 DIAGNOSIS — Z79899 Other long term (current) drug therapy: Secondary | ICD-10-CM | POA: Diagnosis not present

## 2020-12-16 DIAGNOSIS — E785 Hyperlipidemia, unspecified: Secondary | ICD-10-CM

## 2020-12-16 DIAGNOSIS — R55 Syncope and collapse: Secondary | ICD-10-CM | POA: Diagnosis not present

## 2020-12-16 DIAGNOSIS — I739 Peripheral vascular disease, unspecified: Secondary | ICD-10-CM | POA: Diagnosis not present

## 2020-12-16 DIAGNOSIS — R0602 Shortness of breath: Secondary | ICD-10-CM

## 2020-12-16 DIAGNOSIS — I25118 Atherosclerotic heart disease of native coronary artery with other forms of angina pectoris: Secondary | ICD-10-CM

## 2020-12-16 DIAGNOSIS — I10 Essential (primary) hypertension: Secondary | ICD-10-CM | POA: Diagnosis not present

## 2020-12-16 MED ORDER — ISOSORBIDE MONONITRATE ER 60 MG PO TB24
90.0000 mg | ORAL_TABLET | Freq: Every day | ORAL | 3 refills | Status: DC
Start: 2020-12-16 — End: 2021-03-08

## 2020-12-16 MED ORDER — FUROSEMIDE 20 MG PO TABS: 20.0000 mg | ORAL_TABLET | Freq: Every day | ORAL | 1 refills | Status: DC

## 2020-12-16 NOTE — Telephone Encounter (Signed)
At checkout today, pt cancelled his lower extremity dopplers and after explaining why Dr. Fletcher Anon wanted these he still cancelled stating he does not think he has "circulation issues" and he thinks his problems are from previous breaks in his legs. He will reschedule at another time if he decides to have them done. Will forward to Dr. Olga Coaster for review.

## 2020-12-16 NOTE — Progress Notes (Signed)
Cardiology Office Note:    Date:  12/16/2020   ID:  [name], DOB [birth year], MRN [id number]  PCP:  Colon Branch, MD   [facility] Providers Cardiologist:  [name], MD     Referring MD: Colon Branch, MD   Chief Complaint:  Follow-up (CAD)    Patient Profile:    Kenneth Cromley. is a 60 y.o. male with:   Coronary artery disease   Inf-Lat STEMI in 10/2008 s/p POBA to LCx >> s/p CABG  NSTEMI 06/2009 s/p BMS to S-OM  Lat STEMI 06/2016 s/p DES to S-OM  Cath 09/2017: patent grafts  Intol of Brilinta 2/2 shortness of breath   (HFpEF) heart failure with preserved ejection fraction   Hypertension   Hyperlipidemia   +cigs   Former cocaine use  Peripheral arterial disease   S/p stent to L SFA 2018  Dr. Fletcher Anon   CVA in 02/2015, 11/2018  Carotid artery disease  [place] 7/16: Bilat ICA 1-39  Prior CV studies: LEFT HEART CATH AND CORS/GRAFTS ANGIOGRAPHY 10/01/2017 Narrative  Ost LM to Mid LM lesion is 20% stenosed.  Prox LAD lesion is 100% stenosed.  LIMA graft was visualized by angiography and is normal in caliber.  Prox Cx lesion is 100% stenosed.  SVG graft was visualized by angiography and is normal in caliber.  Previously placed [place] to Prox Graft stent (unknown type) is widely patent.  Prox RCA lesion is 100% stenosed.  SVG graft was visualized by angiography and is normal in caliber. 1. Severe triple vessel CAD with occlusion of all three native vessels. He is s/p 3v CABG and all 3 vein grafts are patent. 2. Chronic mid LAD occlusion. LIMA to LAD is patent 3. Chronic mid Circumflex occlusion. SVG to OM is patent 4. Chronic proximal RCA occlusion. SVG to PDA is patent. Recommendations: Continue medical management of CAD. Smoking cessation.  GATED SPECT MYO PERF W/LEXISCAN STRESS 1D 08/27/2017 Narrative 1. EF 35%, lateral hypokinesis. 2. Fixed medium-sized, severe basal to apical inferolateral and anterolateral perfusion  defect. Fixed small, moderate intensity apical septal/apical perfusion defect.  These findings are suggestive of prior infarction without significant ischemia. Intermediate risk study due to low EF.   Echocardiogram 11/27/18 EF 60-65, mild LVH, inf-basal HK, normal RVSF, mod LAE, AV sclerosis w/o AS  VAS US CAROTID DUPLEX BILATERAL 02/21/2015 Summary: Bilateral 1-39 %   History of Present Illness: [name] was last seen by Dr. Angelena Form in 9/21.  He returns for f/u.   He is here alone.  He notes that he has a fairly stable anginal pattern.  He probably gets chest discomfort once a week.  There has not been any significant change.  Although he appears short of breath while talking to me, he notes that he really is not that short of breath when he does activities.  He has not had orthopnea.  He has a cough and was seen by pulmonology for hoarseness and cough.  He saw Dr. Melvyn Novas who stopped his ACE inhibitor and replace it with an ARB.  He has had several episodes of syncope associated with cough.  He has not had any other episodes of syncope since switching to valsartan.  He does have lower extremity swelling.  He wonders if he should be on a diuretic.    Past Medical History:  Diagnosis Date  . Abnormality of gait   . Anxiety   . CAD (coronary artery disease)    a. 10/2008 Inferolateral  STEMI: 3VD w/ occluded LCX (PTCA)-->CABG x 3 (LIMA->LAD, VG->OM, VG->PDA);  b. 06/2009 NSTEMI/PCI: VG->OM 100 (BMS);  c. 05/2013 native 3VD, 3/3 patent grafts. d. 06/2016: Inferior STEMI w/ occluded SVG-OM (DES placed). LIMA-LAD and SVG-PDA patent.  . Chronic bronchitis ([place])   . Chronic diastolic CHF (congestive heart failure) ([place])    a. 02/2015 Echo: EF 50-55%, distal septal, apical, inferobasal HK, mild LVH, mild MR, mildly dil LA.  [name] Cocaine use   . Colon polyps    a. 09/2015 colonscopy: multiple sessile polyps, path negative for high grade dysplasia.  . Depression   . Emphysema (subcutaneous) (surgical)  resulting from a procedure 10/2008.   Bleb resected at time of 10/2008 CABG. Marketed emphysema noted on surgical report.  . Esophagitis    a. 2017 EGD: esophagitis, duodenitis.  [name] ETOH abuse   . GERD (gastroesophageal reflux disease)   . Hemianopia, homonymous, right    "can't see out the right side of either eye since stroke in 2016/pt description 09/23/2016  . Hepatic steatosis   . Hiatal hernia   . History of nuclear stress test 08/2017   Nuclear stress test 1/19: EF 35, inf-lat and ant-lat, apical sept/apical scar; no ischemia; Intermediate Risk  . Hyperlipidemia   . Hypertension   . Iron deficiency anemia 2010  . Morbid obesity ([place])   . Myocardial infarction ([place]) "several"  . PAD (peripheral artery disease) ([place])   . Pancreatitis 06/2015  . Sleep apnea    "don't have a mask" (09/23/2016)  . Stroke ([place]) 02/2015   a. 02/2015 Carotid U/S: 1-39% bilat ICA stenosis; "left me blind" (09/23/2016)  . Tobacco abuse    still smokes  . Tubular adenoma of colon     Current Medications: Current Meds  Medication Sig  . albuterol (VENTOLIN HFA) 108 (90 Base) MCG/ACT inhaler Inhale 2 puffs into the lungs every 6 (six) hours as needed for wheezing or shortness of breath.  [name] aspirin EC 81 MG EC tablet Take 1 tablet (81 mg total) by mouth daily with breakfast.  . atorvastatin (LIPITOR) 80 MG tablet Take 1 tablet (80 mg total) by mouth daily at 6 PM. Patient needs to keep appointment for any future refills.  . budesonide-formoterol (SYMBICORT) 160-4.5 MCG/ACT inhaler Inhale 2 puffs into the lungs 2 (two) times daily.  . clopidogrel (PLAVIX) 75 MG tablet TAKE 1 TABLET (75 MG TOTAL) BY MOUTH DAILY.  . fenofibrate (TRICOR) 145 MG tablet Take 1 tablet (145 mg total) by mouth daily.  . folic acid (FOLVITE) 1 MG tablet Take 1 tablet (1 mg total) by mouth daily.  . furosemide (LASIX) 20 MG tablet Take 1 tablet (20 mg total) by mouth daily. As needed for swelling.  . isosorbide mononitrate (IMDUR) 60  MG 24 hr tablet Take 1.5 tablets (90 mg total) by mouth daily.  . metFORMIN (GLUCOPHAGE) 500 MG tablet Take 1 tablet (500 mg total) by mouth 2 (two) times daily with a meal.  . metoprolol tartrate (LOPRESSOR) 25 MG tablet TAKE 1 TABLET TWICE DAILY  . Multiple Vitamin (MULTIVITAMIN WITH MINERALS) TABS tablet Take 1 tablet by mouth daily.  . nitroGLYCERIN (NITROSTAT) 0.4 MG SL tablet Place 1 tablet (0.4 mg total) under the tongue every 5 (five) minutes x 3 doses as needed for chest pain.  . pantoprazole (PROTONIX) 40 MG tablet TAKE 1 TABLET TWICE DAILY  . thiamine 100 MG tablet Take 1 tablet (100 mg total) by mouth daily.  . traZODone (DESYREL) 50 MG tablet Take  1 tablet (50 mg total) by mouth at bedtime.  . valsartan (DIOVAN) 80 MG tablet Take 1 tablet (80 mg total) by mouth daily.  . [DISCONTINUED] isosorbide mononitrate (IMDUR) 60 MG 24 hr tablet TAKE 1 TABLET EVERY DAY     Allergies:   Patient has no known allergies.   Social History   Tobacco Use  . Smoking status: Former Smoker    Packs/day: 2.00    Years: 45.00    Pack years: 90.00    Types: Cigarettes    Quit date: 10/31/2020    Years since quitting: 0.1  . Smokeless tobacco: Never Used  Vaping Use  . Vaping Use: Never used  Substance Use Topics  . Alcohol use: Yes    Alcohol/week: 14.0 standard drinks    Types: 14 Cans of beer per week    Comment: as off 12/2017: beer 12-pack q week  . Drug use: No    Comment: 09/23/2016 "none in years"     Family Hx: The patient's family history includes Colon cancer in his father; Diabetes in his mother; Heart disease in his mother; Heart failure in his mother. There is no history of Prostate cancer.  Review of Systems  Gastrointestinal: Negative for hematochezia and melena.  Genitourinary: Negative for hematuria.     EKGs/Labs/Other Test Reviewed:    EKG:  EKG is not ordered today.  The ekg ordered today demonstrates n/a  Recent Labs: 12/27/2019: Hemoglobin 15.5; Platelets  221.0 07/10/2020: ALT 13; BUN 11; Creatinine, Ser 0.83; Potassium 4.2; Sodium 134   Recent Lipid Panel Lab Results  Component Value Date/Time   CHOL 171 11/14/2019 02:41 PM   TRIG 97 11/14/2019 02:41 PM   HDL 37 (L) 11/14/2019 02:41 PM   CHOLHDL 4.6 11/14/2019 02:41 PM   CHOLHDL 4.3 11/27/2018 04:41 AM   LDLCALC 116 (H) 11/14/2019 02:41 PM   LDLDIRECT 116.0 05/12/2013 03:17 PM     Risk Assessment/Calculations:      Physical Exam:    VS:  BP 140/80   Pulse 76   Ht 5\' 11"  (1.803 m)   Wt 229 lb 12.8 oz (104.2 kg)   SpO2 90%   BMI 32.05 kg/m     Wt Readings from Last 3 Encounters:  12/16/20 229 lb 12.8 oz (104.2 kg)  11/07/20 225 lb 6.4 oz (102.2 kg)  10/22/20 226 lb 8 oz (102.7 kg)     Constitutional:      Appearance: Healthy appearance. Not in distress.  Neck:     Comments: JVD is not appreciated but exam difficult  Pulmonary:     Breath sounds: No wheezing. No rales.     Comments: Decreased breath sounds Cardiovascular:     Normal rate. Regular rhythm. Normal S1. Normal S2.     Murmurs: There is no murmur.  Edema:    Pretibial: bilateral trace edema of the pretibial area.    Ankle: bilateral trace edema of the ankle. Abdominal:     Palpations: Abdomen is soft. There is no hepatomegaly.  Skin:    General: Skin is warm and dry.  Neurological:     General: No focal deficit present.     Mental Status: Alert and oriented to person, place and time.     Cranial Nerves: Cranial nerves are intact.          ASSESSMENT & PLAN:    1. Coronary artery disease of native artery of native heart with stable angina pectoris ([place]) Hx of inf-lat STEMI in 2010 tx  with POBA to the LCx and subsequent CABG.  He had a NSTEMI several months later and underwent PCI with BMS to the S-OM. He had a lateral STEMI in 11/17 tx with dES to S-OM and his grafts were patent at cardiac catheterization in 2019.  He notes a fairly stable anginal patter.  He has not had any significant change.   His BP is above target.  I have recommended we increase his Isosorbide to 90 mg once daily.  Continue current dose of ASA, clopidogrel, metoprolol tartrate, atorvastatin.  F/u in 6 mos.   2. Shortness of breath 3. Syncope and collapse 4. Leg edema  He has cough induced syncope.  He has a long hx of smoking and associated shortness of breath.  Given his extensive cardiac hx and recent hx of syncope and shortness of breath, I have recommended that we obtain an echocardiogram to rule out reduced LV function.  His EF was normal in 2020.  He has some leg edema.  His exam is difficult for JVP and he has reduced lung sounds on exam.  I will also get a BNP.  I will give him Lasix 20 mg once daily to take prn for leg swelling.  If the BNP is significantly elevated, I will increase this to once daily.  He knows to call if he takes Lasix more than 2 days in a row.  -Echocardiogram   -BMET, BNP  -Furosemide (Lasix) 20 mg once daily prn for leg edema  -Pt to call if he takes Furosemide > 2 days in a row (will need a f/u BMET)  5. PAD (peripheral artery disease) (HCC) S/p stent to L SFA in 2018.  He has f/u LE arterial US studies next week. F/u with Dr. Fletcher Anon as planned.   6. Essential hypertension BP above goal.  We discussed the importance of maintaining good BP control to reduce risk of recurrent MI or CVA. Continue current dose of Valsartan (replaced Lisinopril), metoprolol tartrate.  Increase Isosorbide to 90 mg once daily.    7. Dyslipidemia Continue high intensity statin.  Arrange fasting Lipids, LFTs in the next 3 mos. Goal LDL < 70.    8. Tobacco abuse I have advised him to quit smoking.      Dispo:  Return in about 6 months (around 06/18/2021) for Routine Follow Up w/ Dr. Angelena Form .   Medication Adjustments/Labs and Tests Ordered: Current medicines are reviewed at length with the patient today.  Concerns regarding medicines are outlined above.  Tests Ordered: Orders Placed This Encounter   Procedures  . Basic metabolic panel  . Pro b natriuretic peptide  . ECHOCARDIOGRAM COMPLETE   Medication Changes: Meds ordered this encounter  Medications  . isosorbide mononitrate (IMDUR) 60 MG 24 hr tablet    Sig: Take 1.5 tablets (90 mg total) by mouth daily.    Dispense:  135 tablet    Refill:  3  . furosemide (LASIX) 20 MG tablet    Sig: Take 1 tablet (20 mg total) by mouth daily. As needed for swelling.    Dispense:  30 tablet    Refill:  1    Signed, Richardson Dopp, PA-C  12/16/2020 [facility] [address], [place], [place]  [address] Phone: [phone]; Fax: [phone]

## 2020-12-16 NOTE — Patient Instructions (Signed)
Medication Instructions:  Your physician has recommended you make the following change in your medication:  LASIX 20 MG DAILY AS NEEDED FOR SWELLING BUT PLEASE CALL THE OFFICE IF YOU HAVE TO TAKE MORE THAN 2 DAYS IN A ROW.   INCREASE IMDUR TO 90 MG A DAY.   *If you need a refill on your cardiac medications before your next appointment, please call your pharmacy*   Lab Work: BMET, PRO BNP TODAY  If you have labs (blood work) drawn today and your tests are completely normal, you will receive your results only by: [name] MyChart Message (if you have MyChart) OR . A paper copy in the mail If you have any lab test that is abnormal or we need to change your treatment, we will call you to review the results.   Testing/Procedures: Your physician has requested that you have an echocardiogram. Echocardiography is a painless test that uses sound waves to create images of your heart. It provides your doctor with information about the size and shape of your heart and how well your heart's chambers and valves are working. This procedure takes approximately one hour. There are no restrictions for this procedure.     Follow-Up: At [facility], you and your health needs are our priority.  As part of our continuing mission to provide you with exceptional heart care, we have created designated Provider Care Teams.  These Care Teams include your primary Cardiologist (physician) and Advanced Practice Providers (APPs -  Physician Assistants and Nurse Practitioners) who all work together to provide you with the care you need, when you need it.  We recommend signing up for the patient portal called "MyChart".  Sign up information is provided on this After Visit Summary.  MyChart is used to connect with patients for Virtual Visits (Telemedicine).  Patients are able to view lab/test results, encounter notes, upcoming appointments, etc.  Non-urgent messages can be sent to your provider as well.   To learn more about  what you can do with MyChart, go to [email or web address].    Your next appointment:   6 month(s)  The format for your next appointment:   In Person  Provider:   [name], MD   Other Instructions

## 2020-12-17 ENCOUNTER — Telehealth: Payer: Self-pay

## 2020-12-17 DIAGNOSIS — R0602 Shortness of breath: Secondary | ICD-10-CM

## 2020-12-17 LAB — BASIC METABOLIC PANEL
BUN/Creatinine Ratio: 15 (ref 9–20)
BUN: 11 mg/dL (ref 6–24)
CO2: 25 mmol/L (ref 20–29)
Calcium: 9.5 mg/dL (ref 8.7–10.2)
Chloride: 101 mmol/L (ref 96–106)
Creatinine, Ser: 0.73 mg/dL — ABNORMAL LOW (ref 0.76–1.27)
Glucose: 87 mg/dL (ref 65–99)
Potassium: 4.5 mmol/L (ref 3.5–5.2)
Sodium: 142 mmol/L (ref 134–144)
eGFR: 105 mL/min/{1.73_m2} (ref >59)

## 2020-12-17 LAB — PRO B NATRIURETIC PEPTIDE: NT-Pro BNP: 1146 pg/mL — ABNORMAL HIGH (ref 0–210)

## 2020-12-17 NOTE — Telephone Encounter (Signed)
-----   Message from Liliane Shi, [place] sent at 12/17/2020  5:01 PM EDT ----- BNP elevated.  Creatinine, K+ normal. PLAN:  -Increase furosemide to 40 mg daily x3 days, then 20 mg daily -Increase dietary potassium for 3 days -BMET in 1 week -Arrange follow-up with Dr. Angelena Form or me in 4-6 weeks Richardson Dopp, PA-C    12/17/2020 4:57 PM

## 2020-12-17 NOTE — Telephone Encounter (Signed)
The patient has been notified of the result and verbalized understanding.  All questions (if any) were answered. Antonieta Iba, RN 12/17/2020 5:10 PM  Patient advised to increase Lasix to 40 mg for 3 days then back down to 20 mg daily. Encouraged to increase potassium rich food during this time. Patient will repeat lab work next week. I have scheduled for a follow up with Richardson Dopp, PA-C/

## 2020-12-18 NOTE — Telephone Encounter (Signed)
Ok that's fine

## 2020-12-23 ENCOUNTER — Encounter (HOSPITAL_COMMUNITY): Payer: Medicare HMO

## 2020-12-24 ENCOUNTER — Telehealth: Payer: Self-pay | Admitting: Internal Medicine

## 2020-12-24 DIAGNOSIS — I5032 Chronic diastolic (congestive) heart failure: Secondary | ICD-10-CM

## 2020-12-24 DIAGNOSIS — Z91199 Patient's noncompliance with other medical treatment and regimen due to unspecified reason: Secondary | ICD-10-CM

## 2020-12-24 DIAGNOSIS — Z9119 Patient's noncompliance with other medical treatment and regimen: Secondary | ICD-10-CM

## 2020-12-24 DIAGNOSIS — I639 Cerebral infarction, unspecified: Secondary | ICD-10-CM

## 2020-12-24 DIAGNOSIS — H538 Other visual disturbances: Secondary | ICD-10-CM

## 2020-12-24 DIAGNOSIS — I25118 Atherosclerotic heart disease of native coronary artery with other forms of angina pectoris: Secondary | ICD-10-CM

## 2020-12-24 DIAGNOSIS — E1051 Type 1 diabetes mellitus with diabetic peripheral angiopathy without gangrene: Secondary | ICD-10-CM

## 2020-12-24 MED ORDER — VALSARTAN 80 MG PO TABS: 80.0000 mg | ORAL_TABLET | Freq: Every day | ORAL | 3 refills | Status: DC

## 2020-12-24 NOTE — Telephone Encounter (Signed)
Home Health referral placed

## 2020-12-24 NOTE — Telephone Encounter (Signed)
Patient states he is requesting for a referral to a home health for nursing. He states since his strokes he hasn't been able to sort his meds due to his blurry vision, and other need. Patient has an upcoming appt on June 1st.

## 2020-12-24 NOTE — Telephone Encounter (Signed)
I called and spoke with patient regarding questions. Patient was asking about CPAP pressure and Dr. Melvyn Novas does not do sleep. Sleep study was done through Cardiology and told patient f/u with them regarding CPAP. Patient also stated that Valsartan script was sent to [facility] who said they sent in 90 day supply but patient states he never got it and they will not send him anymore. Patient wanted to send into [facility] to see if it can be filled there. I informed patient script was sent and to call if they are not able to fill out and told patient to contact [employer] to figure out what happened in mix up. Patient verbalized understanding, nothing further needed.

## 2020-12-25 ENCOUNTER — Other Ambulatory Visit: Payer: Medicare HMO | Admitting: *Deleted

## 2020-12-25 ENCOUNTER — Other Ambulatory Visit: Payer: Self-pay

## 2020-12-25 ENCOUNTER — Telehealth: Payer: Self-pay | Admitting: Cardiovascular Disease

## 2020-12-25 DIAGNOSIS — R0602 Shortness of breath: Secondary | ICD-10-CM

## 2020-12-25 DIAGNOSIS — J9611 Chronic respiratory failure with hypoxia: Secondary | ICD-10-CM | POA: Diagnosis not present

## 2020-12-25 DIAGNOSIS — J449 Chronic obstructive pulmonary disease, unspecified: Secondary | ICD-10-CM | POA: Diagnosis not present

## 2020-12-25 DIAGNOSIS — G4733 Obstructive sleep apnea (adult) (pediatric): Secondary | ICD-10-CM

## 2020-12-25 NOTE — Telephone Encounter (Signed)
Pt. Came in for labs today. He says that he needs his cpap machine prescription. He is requesting a call back to discuss what he needs to do

## 2020-12-26 ENCOUNTER — Telehealth: Payer: Self-pay | Admitting: Physician Assistant

## 2020-12-26 LAB — BASIC METABOLIC PANEL
BUN/Creatinine Ratio: 12 (ref 9–20)
BUN: 12 mg/dL (ref 6–24)
CO2: 23 mmol/L (ref 20–29)
Calcium: 9.2 mg/dL (ref 8.7–10.2)
Chloride: 98 mmol/L (ref 96–106)
Creatinine, Ser: 1.03 mg/dL (ref 0.76–1.27)
Glucose: 106 mg/dL — ABNORMAL HIGH (ref 65–99)
Potassium: 4 mmol/L (ref 3.5–5.2)
Sodium: 135 mmol/L (ref 134–144)
eGFR: 84 mL/min/{1.73_m2} (ref >59)

## 2020-12-26 NOTE — Telephone Encounter (Signed)
Pt does has not seen one of our sleep doctors. Pt was referred for sleep study by PCP at that time.  The study was read by Dr. Annamaria Boots.  PCP provided prescription for CPAP per notes on CPAP Titration 02/11/2017:  Will route to Dr. [name] for further recommendations.   Notes recorded by Ladell Pier, MD on 02/23/2017 at 1:50 PM EDT Let pt know that I received the results of his sleep titration study. I have placed a prescription for CPAP in your box to give to him to take to any medical supply store.    Patient Name: Kenneth, [name] Date: 02/11/2017 Gender: Male D.O.B: 1961-03-23 Age (years): 52 Referring Provider: Karle Plumber MD Height (inches): 71 Interpreting Physician: Baird Lyons MD, ABSM  ________________________________________________________

## 2020-12-26 NOTE — Telephone Encounter (Signed)
Spoke with pt and obtained permission to discuss care with his sister.  Pt said that is fine.  Called sister and she had thought that pt was to have Pro BNP repeated this week as well.  Advised her we were only checking his kidney function and electrolytes again due to increasing his Furosemide and K+ for a few days.  Advised we typically don't check a BNP this close together.  Alyse Low understood and was appreciative for call.

## 2020-12-26 NOTE — Telephone Encounter (Signed)
Left message for patient to call back  

## 2020-12-26 NOTE — Telephone Encounter (Signed)
New message:    Patient sister calling to speak with someone concerning the patient lab results she has some questions

## 2020-12-26 NOTE — Telephone Encounter (Signed)
Called pt in regards to labs and brought up this phone call.  Pt states he had to get some new parts for his CPAP machine because he was having issues with it leaking.  Pt states the original company sold out to someone else and the new company is requiring a prescription.  Advised I will update Dr. Camillia Herter nurse.

## 2020-12-27 ENCOUNTER — Other Ambulatory Visit (HOSPITAL_COMMUNITY)
Admission: RE | Admit: 2020-12-27 | Discharge: 2020-12-27 | Disposition: A | Discharge status: Home / Self Care | Payer: Medicare HMO | Source: Ambulatory Visit | Attending: Internal Medicine | Admitting: Internal Medicine

## 2020-12-27 DIAGNOSIS — U071 COVID-19: Secondary | ICD-10-CM | POA: Insufficient documentation

## 2020-12-27 DIAGNOSIS — Z01812 Encounter for preprocedural laboratory examination: Secondary | ICD-10-CM | POA: Insufficient documentation

## 2020-12-27 LAB — SARS CORONAVIRUS 2 (TAT 6-24 HRS): SARS Coronavirus 2: POSITIVE — AB

## 2020-12-28 ENCOUNTER — Telehealth: Payer: Self-pay | Admitting: Pulmonary Disease

## 2020-12-28 NOTE — Telephone Encounter (Signed)
Tested positive for COVID-report called in 5/21  Has an appointment Monday  Encouraged to call the office in the morning I did let him know that his breathing study will not be able to be performed on Monday  Encouraged to call and may be may be able to have a telephone visit as needed

## 2020-12-28 NOTE — Progress Notes (Deleted)
Dr. Olalere notified of positive covid test. 

## 2020-12-30 ENCOUNTER — Ambulatory Visit: Payer: Medicare HMO | Admitting: Internal Medicine

## 2020-12-31 NOTE — Progress Notes (Signed)
LMTCB

## 2021-01-03 ENCOUNTER — Ambulatory Visit: Payer: Medicare HMO | Admitting: Internal Medicine

## 2021-01-08 ENCOUNTER — Ambulatory Visit: Payer: Medicare HMO | Admitting: Internal Medicine

## 2021-01-08 ENCOUNTER — Encounter: Payer: Self-pay | Admitting: Internal Medicine

## 2021-01-09 ENCOUNTER — Telehealth: Payer: Self-pay | Admitting: Internal Medicine

## 2021-01-09 NOTE — Telephone Encounter (Signed)
Rich Reining Case Manger from [facility] [phone]  They will not admit to home health care due to multiple cancellation to do a start of care  Please advice

## 2021-01-09 NOTE — Telephone Encounter (Signed)
Noted, thank you

## 2021-01-09 NOTE — Telephone Encounter (Signed)
FYI

## 2021-01-10 ENCOUNTER — Other Ambulatory Visit: Payer: Self-pay

## 2021-01-10 ENCOUNTER — Ambulatory Visit (HOSPITAL_COMMUNITY): Payer: Medicare HMO | Attending: Cardiology

## 2021-01-10 ENCOUNTER — Telehealth: Payer: Self-pay | Admitting: Internal Medicine

## 2021-01-10 DIAGNOSIS — Z79899 Other long term (current) drug therapy: Secondary | ICD-10-CM | POA: Diagnosis not present

## 2021-01-10 DIAGNOSIS — R0602 Shortness of breath: Secondary | ICD-10-CM | POA: Diagnosis not present

## 2021-01-10 DIAGNOSIS — I25118 Atherosclerotic heart disease of native coronary artery with other forms of angina pectoris: Secondary | ICD-10-CM

## 2021-01-10 DIAGNOSIS — R55 Syncope and collapse: Secondary | ICD-10-CM

## 2021-01-10 DIAGNOSIS — I739 Peripheral vascular disease, unspecified: Secondary | ICD-10-CM | POA: Diagnosis not present

## 2021-01-10 DIAGNOSIS — I10 Essential (primary) hypertension: Secondary | ICD-10-CM | POA: Diagnosis not present

## 2021-01-10 DIAGNOSIS — E785 Hyperlipidemia, unspecified: Secondary | ICD-10-CM

## 2021-01-10 DIAGNOSIS — G4733 Obstructive sleep apnea (adult) (pediatric): Secondary | ICD-10-CM

## 2021-01-10 LAB — ECHOCARDIOGRAM COMPLETE
Area-P 1/2: 4.49 cm2
S' Lateral: 4.1 cm

## 2021-01-10 NOTE — Telephone Encounter (Signed)
Would try autoset for now and f/u with alva or sleep medicine next week to let us know if this worked and arrange for approp f/u

## 2021-01-10 NOTE — Telephone Encounter (Signed)
Called and spoke with patient. He stated that his cpap machine is blowing out too much air. He has contacted his PCP Dr. [name] who has helped him before in the past with his cpap. Dr. [name] advised him that he can not help him with his cpap and placed a referral to [place] for him to re-establish with Dr. [name]. He does have an appt in July to see RA.   He wanted to know if MW could place an order to have the cpap setting decreased. He was unable to tell me what setting his machine is set on. After reviewing his chart, the only pressure setting I could find was 20cm, ordered by Dr. Karle Plumber in 2018.   I did explain to him several times that MW does not handle sleep requests and would ask that he schedule an appt with one of our sleep doctors. He was adamant that I ask MW to see if he could perhaps help him this one time. '  His DME is [employer].   MW, can you please advise? Thanks.

## 2021-01-10 NOTE — Telephone Encounter (Signed)
Per MW- auto 5-15 cm   LMTCB for the pt

## 2021-01-10 NOTE — Telephone Encounter (Signed)
LMTCB

## 2021-01-10 NOTE — Telephone Encounter (Signed)
Please advise on the settings for auto cpap- need range, thanks

## 2021-01-13 ENCOUNTER — Encounter: Payer: Self-pay | Admitting: Physician Assistant

## 2021-01-13 DIAGNOSIS — I5030 Unspecified diastolic (congestive) heart failure: Secondary | ICD-10-CM | POA: Insufficient documentation

## 2021-01-14 ENCOUNTER — Telehealth: Payer: Self-pay | Admitting: *Deleted

## 2021-01-14 DIAGNOSIS — R0602 Shortness of breath: Secondary | ICD-10-CM

## 2021-01-14 MED ORDER — FUROSEMIDE 20 MG PO TABS: 40.0000 mg | ORAL_TABLET | Freq: Every day | ORAL | 1 refills | Status: DC

## 2021-01-14 MED ORDER — POTASSIUM CHLORIDE ER 10 MEQ PO TBCR: 10.0000 meq | EXTENDED_RELEASE_TABLET | Freq: Every day | ORAL | 3 refills | Status: DC

## 2021-01-14 NOTE — Telephone Encounter (Signed)
If he was taking Lasix 20 mg twice daily at the time he had the echocardiogram done, I would increase it. PLAN:  Increase Lasix to 60 mg once daily. Continue with K+ 10 mEq once daily. Make sure he has a f/u BMET in a week. Richardson Dopp, PA-C    01/14/2021 4:52 PM

## 2021-01-14 NOTE — Telephone Encounter (Signed)
-----   Message from Liliane Shi, [place] sent at 01/13/2021  5:20 PM EDT ----- Echocardiogram shows normal LV function with severe diastolic dysfunction and moderate pulmonary hypertension.  There is also evidence of continued volume overload. PLAN: Increase Lasix back to 40 mg daily K+ 10 mEq once daily  BMET 1 week F/u as planned Send copy to PCP Richardson Dopp, PA-C    01/13/2021 5:16 PM

## 2021-01-14 NOTE — Progress Notes (Signed)
Ov scheduled for 01/17/21

## 2021-01-14 NOTE — Telephone Encounter (Signed)
Spoke w patient. For the past week he has been taking lasix 20 mg BID.  No potassium.  I have adv of echo results and recommendations to increase lasix to 40 mg daily and add potassium 10 meq daily.  He will return to have labs next Tue 6/14 and has f/u w Nicki Reaper on 6/21.

## 2021-01-15 MED ORDER — FUROSEMIDE 20 MG PO TABS: 60.0000 mg | ORAL_TABLET | Freq: Every day | ORAL | 6 refills | Status: DC

## 2021-01-15 NOTE — Telephone Encounter (Signed)
Pt confirms he had been taking lasix 20 mg BID at the time of the echo.  Explained plan to increase lasix to 60 mg daily and also take potassium 10 meq daily.    Pt voices understanding and agreement.  He will pick up updated prescirptions at [facility] and return to our office on 6/14 for labs.  Asks about how long he will be on this regimen.  Reviewed his symptoms associated w volume overload and need to monitor labs.

## 2021-01-15 NOTE — Telephone Encounter (Signed)
Left message for pt to call back re: medication recommendation.

## 2021-01-15 NOTE — Telephone Encounter (Signed)
lmtcb for pt.  

## 2021-01-16 NOTE — Telephone Encounter (Signed)
Called and spoke to pt. Informed him of the pressure change. Order placed. Pt verbalized understanding and denied any further questions or concerns at this time.

## 2021-01-17 ENCOUNTER — Ambulatory Visit: Payer: Medicare HMO | Admitting: Internal Medicine

## 2021-01-17 NOTE — Progress Notes (Deleted)
[name]., male    DOB: [birth year],   MRN: [id number]   Brief patient profile:  Seen by [name] 2014 :  OSA  > stopped it on his own   38 yowm quit smoking 10/31/20 referred to pulmonary clinic 11/07/2020 by Dr  [name] for doe/cough/ hoarseness x ??? (pt unable to say)  While on ACEi chronically    History of Present Illness  11/07/2020  Pulmonary/ 1st office eval/[name]  Voice and swallowing s/p ST eval 09/13/2020 mild risk asp Chief Complaint  Patient presents with   Pulmonary Consult    Referred by Dr [name]. He states he has been told that he had COPD.   Dyspnea:  hc parking but can do even large store if walks slowly   Cough: assoc with choking / strangling / also blacked out from coughing 6 weeks prior to [place] prior to symbicort  With neg eval by [name] 09/03/20   Sleep: on side bed is flat with pillows  SABA use: not sure helping 02 :  3lpm hs/ not using daytime  Rec Cxr:  CM / no acute change Change protonix (pantoprazole)  to 40 mg Take 30- 60 min before your first and last meals of the day  GERD diet   Stop lisinopril and start diovan 80 mg one daily  Plan A = Automatic = Always=   Change symbicort 160 Take 1 puffs first thing in am and then another 2 puffs about 12 hours later.  Work on inhaler technique:   Plan B = Backup (to supplement plan A, not to replace it) Only use your albuterol inhaler as a rescue medication  Please schedule a follow up office visit in 6 weeks, call sooner if needed with all medications /inhalers/ solutions in hand so we can verify exactly what you are taking. This includes all medications from all doctors and over the counters  - PFTs on return - don't take the symbicort that day  01/17/2021  f/u ov/[name] re:  No chief complaint on file.   Dyspnea:  *** Cough: *** Sleeping: *** SABA use: *** 02: *** Covid status:   ***   No obvious day to day or daytime variability or assoc excess/ purulent sputum or mucus plugs or hemoptysis or cp or  chest tightness, subjective wheeze or overt sinus or hb symptoms.  without nocturnal  or early am exacerbation  of respiratory  c/o's or need for noct saba. Also denies any obvious fluctuation of symptoms with weather or environmental changes or other aggravating or alleviating factors except as outlined above   No unusual exposure hx or h/o childhood pna/ asthma or knowledge of premature birth.  Current Allergies, Complete Past Medical History, Past Surgical History, Family History, and Social History were reviewed in [employer] record.  ROS  The following are not active complaints unless bolded Hoarseness, sore throat, dysphagia, dental problems, itching, sneezing,  nasal congestion or discharge of excess mucus or purulent secretions, ear ache,   fever, chills, sweats, unintended wt loss or wt gain, classically pleuritic or exertional cp,  orthopnea pnd or arm/hand swelling  or leg swelling, presyncope, palpitations, abdominal pain, anorexia, nausea, vomiting, diarrhea  or change in bowel habits or change in bladder habits, change in stools or change in urine, dysuria, hematuria,  rash, arthralgias, visual complaints, headache, numbness, weakness or ataxia or problems with walking or coordination,  change in mood or  memory.        No  outpatient medications have been marked as taking for the 01/17/21 encounter (Appointment) with Tanda Rockers, MD.          Past Medical History:  Diagnosis Date   Abnormality of gait    Anxiety    CAD (coronary artery disease)    a. 10/2008 Inferolateral STEMI: 3VD w/ occluded LCX (PTCA)-->CABG x 3 (LIMA->LAD, VG->OM, VG->PDA);  b. 06/2009 NSTEMI/PCI: VG->OM 100 (BMS);  c. 05/2013 native 3VD, 3/3 patent grafts. d. 06/2016: Inferior STEMI w/ occluded SVG-OM (DES placed). LIMA-LAD and SVG-PDA patent.   Chronic bronchitis (HCC)    Chronic diastolic CHF (congestive heart failure) ([place])    a. 02/2015 Echo: EF 50-55%, distal septal, apical,  inferobasal HK, mild LVH, mild MR, mildly dil LA.   Cocaine use    Colon polyps    a. 09/2015 colonscopy: multiple sessile polyps, path negative for high grade dysplasia.   Depression    Emphysema (subcutaneous) (surgical) resulting from a procedure 10/2008.   Bleb resected at time of 10/2008 CABG. Marketed emphysema noted on surgical report.   Esophagitis    a. 2017 EGD: esophagitis, duodenitis.   ETOH abuse    GERD (gastroesophageal reflux disease)    Hemianopia, homonymous, right    "can't see out the right side of either eye since stroke in 2016/pt description 09/23/2016   Hepatic steatosis    Hiatal hernia    History of nuclear stress test 08/2017   Nuclear stress test 1/19: EF 35, inf-lat and ant-lat, apical sept/apical scar; no ischemia; Intermediate Risk   Hyperlipidemia    Hypertension    Iron deficiency anemia 2010   Morbid obesity ([place])    Myocardial infarction [facility]) "several"   PAD (peripheral artery disease) ([place])    Pancreatitis 06/2015   Sleep apnea    "don't have a mask" (09/23/2016)   Stroke ([place]) 02/2015   a. 02/2015 Carotid U/S: 1-39% bilat ICA stenosis; "left me blind" (09/23/2016)   Tobacco abuse    still smokes   Tubular adenoma of colon      Objective:       Wt Readings from Last 3 Encounters:  12/16/20 229 lb 12.8 oz (104.2 kg)  11/07/20 225 lb 6.4 oz (102.2 kg)  10/22/20 226 lb 8 oz (102.7 kg)      Vital signs reviewed  01/17/2021  - Note at rest 02 sats  ***% on ***   General appearance:    ***  ,  Min bar  R > L pitting***        Assessment

## 2021-01-20 ENCOUNTER — Telehealth: Payer: Self-pay | Admitting: Internal Medicine

## 2021-01-20 DIAGNOSIS — G4733 Obstructive sleep apnea (adult) (pediatric): Secondary | ICD-10-CM

## 2021-01-20 NOTE — Telephone Encounter (Signed)
The patient stated he received a letter stating that he missed 3 appointments and could be discharged from the office. The patient admitted he missed 05/24/20; however, on 01/08/21, he canceled the appointment via the automatic call.

## 2021-01-20 NOTE — Telephone Encounter (Signed)
Please see below regarding CPAP Pressure change order  New, [place]wood Levo, Tasia M; New, Bradley; Sheran Lawless, Melissa Hello,   I received the following message from our PAP download team/ settings team:   ** Received a pressure change to Auto and pt only has CPAP with no option for auto. This machine can only do a set pressure**.   Please let me know what we need to do for the settings.   Thank you,   Demetrius Charity

## 2021-01-20 NOTE — Telephone Encounter (Signed)
Dr. Ander Slade, Please see note regarding settings for CPAP from PAP download team and advise.  Thank you.

## 2021-01-21 ENCOUNTER — Other Ambulatory Visit: Payer: Self-pay

## 2021-01-21 ENCOUNTER — Other Ambulatory Visit: Payer: Medicare HMO | Admitting: *Deleted

## 2021-01-21 DIAGNOSIS — R0602 Shortness of breath: Secondary | ICD-10-CM | POA: Diagnosis not present

## 2021-01-21 NOTE — Telephone Encounter (Signed)
Decrease pressure to 16.  Patient to call if pressure still feels intolerable  - obtain a download from machine to ascertain if the pressure is effective in treating the apnea events within a couple of weeks   Let us make sure that he is set up to see one of [place] soon

## 2021-01-21 NOTE — Telephone Encounter (Signed)
I called and spoke with patient regarding Dr. Ander Slade recs. Patient verbalized understanding and I sent in order to [employer] to decrease the pressure to 16cm and patient will call if this is not tolerable. Patient also has an f/u appt with tammy on 01/28/21. Patient is not in [place] so I have written Brad New about pressure change and to see if patient needs to be added to airview. I also instructed patient to check machine for a SD and if he has one, to bring it OV with Tammy and we can get compliance that way. Patient verbalized understanding. Nothing further needed.

## 2021-01-22 LAB — BASIC METABOLIC PANEL
BUN/Creatinine Ratio: 16 (ref 9–20)
BUN: 13 mg/dL (ref 6–24)
CO2: 21 mmol/L (ref 20–29)
Calcium: 9.1 mg/dL (ref 8.7–10.2)
Chloride: 99 mmol/L (ref 96–106)
Creatinine, Ser: 0.81 mg/dL (ref 0.76–1.27)
Glucose: 106 mg/dL — ABNORMAL HIGH (ref 65–99)
Potassium: 4.3 mmol/L (ref 3.5–5.2)
Sodium: 137 mmol/L (ref 134–144)
eGFR: 102 mL/min/{1.73_m2} (ref >59)

## 2021-01-25 DIAGNOSIS — J9611 Chronic respiratory failure with hypoxia: Secondary | ICD-10-CM | POA: Diagnosis not present

## 2021-01-25 DIAGNOSIS — J449 Chronic obstructive pulmonary disease, unspecified: Secondary | ICD-10-CM | POA: Diagnosis not present

## 2021-01-28 ENCOUNTER — Ambulatory Visit: Payer: Medicare HMO | Admitting: Adult Health

## 2021-01-28 ENCOUNTER — Encounter: Payer: Self-pay | Admitting: Adult Health

## 2021-01-28 ENCOUNTER — Encounter: Payer: Self-pay | Admitting: Physician Assistant

## 2021-01-28 ENCOUNTER — Ambulatory Visit: Payer: Medicare HMO | Admitting: Physician Assistant

## 2021-01-28 ENCOUNTER — Other Ambulatory Visit: Payer: Self-pay

## 2021-01-28 VITALS — BP 102/60 | HR 62 | Ht 71.0 in | Wt 218.0 lb

## 2021-01-28 VITALS — BP 138/70 | HR 68 | Ht 71.0 in | Wt 218.0 lb

## 2021-01-28 DIAGNOSIS — I5032 Chronic diastolic (congestive) heart failure: Secondary | ICD-10-CM

## 2021-01-28 DIAGNOSIS — G4733 Obstructive sleep apnea (adult) (pediatric): Secondary | ICD-10-CM | POA: Diagnosis not present

## 2021-01-28 DIAGNOSIS — R040 Epistaxis: Secondary | ICD-10-CM | POA: Diagnosis not present

## 2021-01-28 DIAGNOSIS — Z720 Tobacco use: Secondary | ICD-10-CM

## 2021-01-28 DIAGNOSIS — R0609 Other forms of dyspnea: Secondary | ICD-10-CM

## 2021-01-28 DIAGNOSIS — R0600 Dyspnea, unspecified: Secondary | ICD-10-CM | POA: Diagnosis not present

## 2021-01-28 DIAGNOSIS — F17200 Nicotine dependence, unspecified, uncomplicated: Secondary | ICD-10-CM

## 2021-01-28 DIAGNOSIS — I25118 Atherosclerotic heart disease of native coronary artery with other forms of angina pectoris: Secondary | ICD-10-CM

## 2021-01-28 DIAGNOSIS — J42 Unspecified chronic bronchitis: Secondary | ICD-10-CM | POA: Diagnosis not present

## 2021-01-28 DIAGNOSIS — I10 Essential (primary) hypertension: Secondary | ICD-10-CM

## 2021-01-28 DIAGNOSIS — I77810 Thoracic aortic ectasia: Secondary | ICD-10-CM

## 2021-01-28 DIAGNOSIS — E785 Hyperlipidemia, unspecified: Secondary | ICD-10-CM | POA: Diagnosis not present

## 2021-01-28 NOTE — Patient Instructions (Signed)
Medication Instructions:    Your physician recommends that you continue on your current medications as directed. Please refer to the Current Medication list given to you today.  *If you need a refill on your cardiac medications before your next appointment, please call your pharmacy*   Lab Work: TODAY!!!! BMET   If you have labs (blood work) drawn today and your tests are completely normal, you will receive your results only by: [place] (if you have MyChart) OR A paper copy in the mail If you have any lab test that is abnormal or we need to change your treatment, we will call you to review the results.   Testing/Procedures: Your physician has requested that you have an echocardiogram. Wednesday, June 21 @ 3:00 2023. Echocardiography is a painless test that uses sound waves to create images of your heart. It provides your doctor with information about the size and shape of your heart and how well your heart's chambers and valves are working. This procedure takes approximately one hour. There are no restrictions for this procedure.    Follow-Up: At [facility], you and your health needs are our priority.  As part of our continuing mission to provide you with exceptional heart care, we have created designated Provider Care Teams.  These Care Teams include your primary Cardiologist (physician) and Advanced Practice Providers (APPs -  Physician Assistants and Nurse Practitioners) who all work together to provide you with the care you need, when you need it.  We recommend signing up for the patient portal called "MyChart".  Sign up information is provided on this After Visit Summary.  MyChart is used to connect with patients for Virtual Visits (Telemedicine).  Patients are able to view lab/test results, encounter notes, upcoming appointments, etc.  Non-urgent messages can be sent to your provider as well.   To learn more about what you can do with MyChart, go to  [email or web address].    Your next appointment:   3 month(s) with Richardson Dopp, PA on Tuesday, September 20 @ 2:15, eat breakfast and skip lunch going to get fasting labs.   The format for your next appointment:   In Person  Provider:   Richardson Dopp, PA-C   Other Instructions

## 2021-01-28 NOTE — Assessment & Plan Note (Signed)
Healthy weight loss discussed 

## 2021-01-28 NOTE — Assessment & Plan Note (Signed)
Appears euvolemic on exam.  Continue on diuretics and follow-up with cardiology.

## 2021-01-28 NOTE — Patient Instructions (Addendum)
Adjust CPAP pressure 12 to 20cm auto .  Wear CPAP all night long .  Refer to LDCT screening program with Eric Form  Continue on Symbicort 2 puffs Twice daily  , rinse after use.  Albuterol inhaler As needed   Work on not smoking .  Follow up in 2 months with Dr. Melvyn Novas or Cherae Marton NP with PFTs  and As needed   Please contact office for sooner follow up if symptoms do not improve or worsen or seek emergency care

## 2021-01-28 NOTE — Progress Notes (Signed)
Cardiology Office Note:    Date:  01/28/2021   ID:  Kenneth Romney., DOB December 04, 1960, MRN [id number]  PCP:  Colon Branch, MD   [facility] Providers Cardiologist:  [name], MD      Referring MD: Colon Branch, MD   Chief Complaint:  Follow-up (CHF)    Patient Profile:    Kenneth Leichter. is a 60 y.o. male with:  Coronary artery disease Inf-Lat STEMI in 10/2008 s/p POBA to LCx >> s/p CABG NSTEMI 06/2009 s/p BMS to S-OM Lat STEMI 06/2016 s/p DES to S-OM Cath 09/2017: patent grafts Intol of Brilinta 2/2 shortness of breath (HFpEF) heart failure with preserved ejection fraction Hypertension Hyperlipidemia +cigs Former cocaine use Peripheral arterial disease S/p stent to L SFA 2018 Dr. Fletcher Anon CVA in 02/2015, 11/2018 Carotid artery disease [place] 7/16: Bilat ICA 1-39   Prior CV studies: Echocardiogram 01/10/21 EF 55-60, no RWMA, Gr 3 DD, normal RVSF, RVSP 53.2, mod pulm htn, AV sclerosis w/o AS, dilated aortic root (41 mm), dilated ascending aorta (37 mm)  LEFT HEART CATH AND CORS/GRAFTS ANGIOGRAPHY 10/01/2017 Narrative 1. Severe triple vessel CAD with occlusion of all three native vessels. He is s/p 3v CABG and all 3 vein grafts are patent. 2. Chronic mid LAD occlusion. LIMA to LAD is patent 3. Chronic mid Circumflex occlusion. SVG to OM is patent 4. Chronic proximal RCA occlusion. SVG to PDA is patent. Recommendations: Continue medical management of CAD. Smoking cessation.   GATED SPECT MYO PERF W/LEXISCAN STRESS 1D 08/27/2017 Narrative 1. EF 35%, lateral hypokinesis. 2. Fixed medium-sized, severe basal to apical inferolateral and anterolateral perfusion defect. Fixed small, moderate intensity apical septal/apical perfusion defect.  These findings are suggestive of prior infarction without significant ischemia. Intermediate risk study due to low EF.   Echocardiogram 11/27/18 EF 60-65, mild LVH, inf-basal HK, normal RVSF, mod LAE, AV sclerosis w/o AS   VAS  US CAROTID DUPLEX BILATERAL 02/21/2015 Summary: Bilateral 1-39 %   History of Present Illness: [name] was last seen in 5/22.  He had symptoms of shortness of breath and leg edema.  His exam was difficult to assess for volume overload.  His BNP was significantly elevated and I placed him on furosemide.  A follow-up echocardiogram demonstrated normal LV function.  He still had evidence of elevated LVEDP.  We therefore kept him on a higher dose of furosemide.  He returns for follow-up.  He is here alone.  His weight is down 11 pounds since last seen.  His breathing is somewhat improved.  He also notes improved congestion.  He has not had orthopnea, chest pain.  He notes some leg edema.  He has not had syncope.        Past Medical History:  Diagnosis Date   (HFpEF) heart failure with preserved ejection fraction ([place])    Echo 6/22: EF 55-60, no RWMA, GR 3 DD, elevated LVEDP, normal RVSF, RVSP 53.2, mild to moderate AV sclerosis without stenosis, dilated aortic root (41 mm), ascending aorta 37 mm   Abnormality of gait    Anxiety    CAD (coronary artery disease)    a. 10/2008 Inferolateral STEMI: 3VD w/ occluded LCX (PTCA)-->CABG x 3 (LIMA->LAD, VG->OM, VG->PDA);  b. 06/2009 NSTEMI/PCI: VG->OM 100 (BMS);  c. 05/2013 native 3VD, 3/3 patent grafts. d. 06/2016: Inferior STEMI w/ occluded SVG-OM (DES placed). LIMA-LAD and SVG-PDA patent.   Chronic bronchitis (HCC)    Chronic diastolic CHF (congestive heart failure) ([place])  a. 02/2015 Echo: EF 50-55%, distal septal, apical, inferobasal HK, mild LVH, mild MR, mildly dil LA.   Cocaine use    Colon polyps    a. 09/2015 colonscopy: multiple sessile polyps, path negative for high grade dysplasia.   Depression    Emphysema (subcutaneous) (surgical) resulting from a procedure 10/2008.   Bleb resected at time of 10/2008 CABG. Marketed emphysema noted on surgical report.   Esophagitis    a. 2017 EGD: esophagitis, duodenitis.   ETOH abuse    GERD  (gastroesophageal reflux disease)    Hemianopia, homonymous, right    "can't see out the right side of either eye since stroke in 2016/pt description 09/23/2016   Hepatic steatosis    Hiatal hernia    History of nuclear stress test 08/2017   Nuclear stress test 1/19: EF 35, inf-lat and ant-lat, apical sept/apical scar; no ischemia; Intermediate Risk   Hyperlipidemia    Hypertension    Iron deficiency anemia 2010   Morbid obesity ([place])    Myocardial infarction [facility]) "several"   PAD (peripheral artery disease) ([place])    Pancreatitis 06/2015   Sleep apnea    "don't have a mask" (09/23/2016)   Stroke ([place]) 02/2015   a. 02/2015 Carotid U/S: 1-39% bilat ICA stenosis; "left me blind" (09/23/2016)   Tobacco abuse    still smokes   Tubular adenoma of colon     Current Medications: Current Meds  Medication Sig   albuterol (VENTOLIN HFA) 108 (90 Base) MCG/ACT inhaler Inhale 2 puffs into the lungs every 6 (six) hours as needed for wheezing or shortness of breath.   aspirin EC 81 MG EC tablet Take 1 tablet (81 mg total) by mouth daily with breakfast.   atorvastatin (LIPITOR) 80 MG tablet Take 1 tablet (80 mg total) by mouth daily at 6 PM. Patient needs to keep appointment for any future refills.   budesonide-formoterol (SYMBICORT) 160-4.5 MCG/ACT inhaler Inhale 2 puffs into the lungs 2 (two) times daily.   clopidogrel (PLAVIX) 75 MG tablet TAKE 1 TABLET (75 MG TOTAL) BY MOUTH DAILY.   fenofibrate (TRICOR) 145 MG tablet Take 1 tablet (145 mg total) by mouth daily.   folic acid (FOLVITE) 1 MG tablet Take 1 tablet (1 mg total) by mouth daily.   furosemide (LASIX) 20 MG tablet Take 3 tablets (60 mg total) by mouth daily.   isosorbide mononitrate (IMDUR) 60 MG 24 hr tablet Take 1.5 tablets (90 mg total) by mouth daily.   metFORMIN (GLUCOPHAGE) 500 MG tablet Take 1 tablet (500 mg total) by mouth 2 (two) times daily with a meal.   metoprolol tartrate (LOPRESSOR) 25 MG tablet TAKE 1 TABLET TWICE DAILY    nitroGLYCERIN (NITROSTAT) 0.4 MG SL tablet Place 1 tablet (0.4 mg total) under the tongue every 5 (five) minutes x 3 doses as needed for chest pain.   pantoprazole (PROTONIX) 40 MG tablet TAKE 1 TABLET TWICE DAILY   potassium chloride (KLOR-CON) 10 MEQ tablet Take 1 tablet (10 mEq total) by mouth daily.   tamsulosin (FLOMAX) 0.4 MG CAPS capsule Take 0.4 mg by mouth at bedtime.   thiamine 100 MG tablet Take 1 tablet (100 mg total) by mouth daily.   traZODone (DESYREL) 50 MG tablet Take 1 tablet (50 mg total) by mouth at bedtime.   valsartan (DIOVAN) 80 MG tablet Take 1 tablet (80 mg total) by mouth daily.     Allergies:   Patient has no known allergies.   Social History   Tobacco  Use   Smoking status: Former    Packs/day: 2.00    Years: 45.00    Pack years: 90.00    Types: Cigarettes    Quit date: 10/31/2020    Years since quitting: 0.2   Smokeless tobacco: Never  Vaping Use   Vaping Use: Never used  Substance Use Topics   Alcohol use: Yes    Alcohol/week: 14.0 standard drinks    Types: 14 Cans of beer per week    Comment: as off 12/2017: beer 12-pack q week   Drug use: No    Comment: 09/23/2016 "none in years"     Family Hx: The patient's family history includes Colon cancer in his father; Diabetes in his mother; Heart disease in his mother; Heart failure in his mother. There is no history of Prostate cancer.  Review of Systems  HENT:  Positive for nosebleeds.     EKGs/Labs/Other Test Reviewed:    EKG:  EKG is not ordered today.  The ekg ordered today demonstrates n/a  Recent Labs: 07/10/2020: ALT 13 12/16/2020: NT-Pro BNP 1,146 01/21/2021: BUN 13; Creatinine, Ser 0.81; Potassium 4.3; Sodium 137   Recent Lipid Panel Lab Results  Component Value Date/Time   CHOL 171 11/14/2019 02:41 PM   TRIG 97 11/14/2019 02:41 PM   HDL 37 (L) 11/14/2019 02:41 PM   LDLCALC 116 (H) 11/14/2019 02:41 PM   LDLDIRECT 116.0 05/12/2013 03:17 PM      Risk Assessment/Calculations:       Physical Exam:    VS:  Pulse 68   Ht 5\' 11"  (1.803 m)   Wt 218 lb (98.9 kg)   SpO2 98%   BMI 30.40 kg/m     Wt Readings from Last 3 Encounters:  01/28/21 218 lb (98.9 kg)  12/16/20 229 lb 12.8 oz (104.2 kg)  11/07/20 225 lb 6.4 oz (102.2 kg)     Constitutional:      Appearance: Healthy appearance. Not in distress.  Neck:     Vascular: JVD normal.  Pulmonary:     Effort: Pulmonary effort is normal.     Breath sounds: No wheezing. No rales.  Cardiovascular:     Normal rate. Regular rhythm. Normal S1. Normal S2.      Murmurs: There is no murmur.  Edema:    Peripheral edema absent.  Abdominal:     Palpations: Abdomen is soft.  Skin:    General: Skin is warm and dry.  Neurological:     General: No focal deficit present.     Mental Status: Alert and oriented to person, place and time.     Cranial Nerves: Cranial nerves are intact.        ASSESSMENT & PLAN:    1. Chronic heart failure with preserved ejection fraction (HCC) Echocardiogram 6/22 demonstrated normal LV function and severe diastolic dysfunction.  He also had moderate pulmonary hypertension.  He still had evidence of volume overload.  We continued him on higher dose furosemide.  His weight is down and his symptoms have improved.  He is NYHA IIb.  Continue current dose of furosemide.  Obtain follow-up BMET today.  If his potassium and renal function remain stable, I will stop his potassium and place him on spironolactone 25 mg daily.  Consider SGLT2 inhibitor in the future.  If his blood pressure remains uncontrolled, we could consider switching his ARB to [facility].  2. Coronary artery disease of native artery of native heart with stable angina pectoris ([place]) Hx of inf-lat STEMI in  2010 tx with POBA to the LCx and subsequent CABG.  He had a NSTEMI several months later and underwent PCI with BMS to the S-OM. He had a lateral STEMI in 11/17 tx with dES to S-OM and his grafts were patent at cardiac catheterization in  2019.  He is doing well without angina.  Continue current dose of aspirin, atorvastatin, clopidogrel, isosorbide, metoprolol tartrate.  Of note, he did stop his aspirin recently due to nosebleeds.  I have asked him to resume this after several days if he does not have recurrent nosebleeds.  3. OSA (obstructive sleep apnea) He has an appointment with pulmonology next month to reestablish his CPAP.  4. Essential hypertension Blood pressure is better controlled.  As noted, I will obtain labs today and likely start spironolactone.  5. Dyslipidemia Continue high intensity statin therapy.  Arrange fasting lipids and LFTs in 3 months.  6. Tobacco abuse He continues to smoke but knows he needs to quit.  7. Epistaxis He has seen ENT in the past for hoarseness.  I have asked him to call his ENT for follow-up on his nosebleeds.  Of note, he did stop all of his blood pressure medicines recently and this is when his nosebleeds developed.  I suspect his pressure was uncontrolled when this started.  I have asked him to continue taking his blood pressure medications as recommended.     Dispo:  Return in about 3 months (around 04/30/2021) for Routine Follow with Richardson Dopp, PA in 3 months..   Medication Adjustments/Labs and Tests Ordered: Current medicines are reviewed at length with the patient today.  Concerns regarding medicines are outlined above.  Tests Ordered: Orders Placed This Encounter  Procedures   Basic Metabolic Panel (BMET)   ECHOCARDIOGRAM COMPLETE    Medication Changes: No orders of the defined types were placed in this encounter.   Signed, Richardson Dopp, PA-C  01/28/2021 4:07 PM    [facility] [address], [place], [place]  [address] Phone: [phone]; Fax: [phone]

## 2021-01-28 NOTE — Assessment & Plan Note (Signed)
Severe obstructive sleep apnea.  Patient education on sleep apnea and CPAP usage.  We will send order to local DME company to change to AutoSet 10-20 to see if this is more comfortable.  We will get a CPAP download on return visit.  Plan  Patient Instructions  Adjust CPAP pressure 12 to 20cm auto .  Wear CPAP all night long .  Refer to LDCT screening program with Eric Form  Continue on Symbicort 2 puffs Twice daily  , rinse after use.  Albuterol inhaler As needed   Work on not smoking .  Follow up in 2 months with Dr. Melvyn Novas or Aanshi Batchelder NP with PFTs  and As needed   Please contact office for sooner follow up if symptoms do not improve or worsen or seek emergency care

## 2021-01-28 NOTE — Assessment & Plan Note (Signed)
Patient has a strong smoking history.  Suspect he has underlying COPD.  PFTs are pending.  Continue on Symbicort twice daily. Refer to the low-dose CT screening program  Plan Patient Instructions  Adjust CPAP pressure 12 to 20cm auto .  Wear CPAP all night long .  Refer to LDCT screening program with Eric Form  Continue on Symbicort 2 puffs Twice daily  , rinse after use.  Albuterol inhaler As needed   Work on not smoking .  Follow up in 2 months with Dr. Melvyn Novas or Hevin Jeffcoat NP with PFTs  and As needed   Please contact office for sooner follow up if symptoms do not improve or worsen or seek emergency care

## 2021-01-28 NOTE — Assessment & Plan Note (Signed)
Smoking cessation discussed in detail 

## 2021-01-28 NOTE — Progress Notes (Signed)
@Patient  ID: [name]., male    DOB: 1961-07-19, 60 y.o.   MRN: [id number]  Chief Complaint  Patient presents with   Follow-up    Referring provider: Colon Branch, MD  HPI: 60 yo male smoker  seen for pulmonary consult of cough and dyspnea 11/07/20  Medical history significant for CAD, CHF , polysubstance abuse , sleep apnea -not on CPAP , stroke   TEST/EVENTS :  2 D echo 01/10/21 -EF 55-60%, GR III DD, Mod to severe pulmonary Hypertension -PAP 47mmHg  Sleep study 01/2017 AHI 86.7/hr , mild central sleep apnea, severe oxygen desats ,  CPAP titration 02/11/2017 - CPAP 20cmH2O   01/28/2021 Follow up : Cough [name]  Patient returns for a 18-month follow-up.  Patient was seen for a pulmonary consult last visit for cough and dyspnea.  Patient has a very  heavy smoking history.  He continues to smoke.  Smoking cessation was discussed. Patient was changed from his ACE inhibitor over to Diovan.  Recommend to begin Symbicort.  Patient says since last visit.  Cough and cough syncope have improved.  He continues to have shortness of breath with activities especially.  Patient has chronic hoarseness and difficulty swallowing.  Has been seen by ENT with previous laryngoscopy that showed some mild edema possibly secondary to reflux.  He is currently on Protonix.  Patient is following with cardiology has diastolic dysfunction.  Recent echo showed grade 3 diastolic dysfunction and moderate pulmonary hypertension with pulmonary pressure at 53 mmHg.  Patient has known obstructive sleep apnea.  He was started on CPAP in 2018 by his primary care provider.  But says he has not been able to wear this.  He says the pressure is way too high.  Patient has daytime sleepiness.  Has low energy and fatigue.  Has restless sleep and snoring Patient has had a previous swallow evaluation has had previous strokes.  Has known dysphagia.   He was set up for pulmonary function testing last month but was unable to  complete.  He was COVID-positive.  Patient says he was essentially asymptomatic.  Was fully vaccinated.    Patient says he does have oxygen at home but has not used this in a long time.      No Known Allergies  Immunization History  Administered Date(s) Administered   PFIZER(Purple Top)SARS-COV-2 Vaccination 11/17/2019, 12/11/2019, 07/18/2020   Pneumococcal Polysaccharide-23 11/03/2016   Tdap 11/03/2016    Past Medical History:  Diagnosis Date   (HFpEF) heart failure with preserved ejection fraction ([place])    Echo 6/22: EF 55-60, no RWMA, GR 3 DD, elevated LVEDP, normal RVSF, RVSP 53.2, mild to moderate AV sclerosis without stenosis, dilated aortic root (41 mm), ascending aorta 37 mm   Abnormality of gait    Anxiety    CAD (coronary artery disease)    a. 10/2008 Inferolateral STEMI: 3VD w/ occluded LCX (PTCA)-->CABG x 3 (LIMA->LAD, VG->OM, VG->PDA);  b. 06/2009 NSTEMI/PCI: VG->OM 100 (BMS);  c. 05/2013 native 3VD, 3/3 patent grafts. d. 06/2016: Inferior STEMI w/ occluded SVG-OM (DES placed). LIMA-LAD and SVG-PDA patent.   Chronic bronchitis (HCC)    Chronic diastolic CHF (congestive heart failure) ([place])    a. 02/2015 Echo: EF 50-55%, distal septal, apical, inferobasal HK, mild LVH, mild MR, mildly dil LA.   Cocaine use    Colon polyps    a. 09/2015 colonscopy: multiple sessile polyps, path negative for high grade dysplasia.   Depression    Emphysema (subcutaneous) (surgical)  resulting from a procedure 10/2008.   Bleb resected at time of 10/2008 CABG. Marketed emphysema noted on surgical report.   Esophagitis    a. 2017 EGD: esophagitis, duodenitis.   ETOH abuse    GERD (gastroesophageal reflux disease)    Hemianopia, homonymous, right    "can't see out the right side of either eye since stroke in 2016/pt description 09/23/2016   Hepatic steatosis    Hiatal hernia    History of nuclear stress test 08/2017   Nuclear stress test 1/19: EF 35, inf-lat and ant-lat, apical sept/apical  scar; no ischemia; Intermediate Risk   Hyperlipidemia    Hypertension    Iron deficiency anemia 2010   Morbid obesity ([place])    Myocardial infarction [facility]) "several"   PAD (peripheral artery disease) ([place])    Pancreatitis 06/2015   Sleep apnea    "don't have a mask" (09/23/2016)   Stroke ([place]) 02/2015   a. 02/2015 Carotid U/S: 1-39% bilat ICA stenosis; "left me blind" (09/23/2016)   Tobacco abuse    still smokes   Tubular adenoma of colon     Tobacco History: Social History   Tobacco Use  Smoking Status Every Day   Packs/day: 2.00   Years: 45.00   Pack years: 90.00   Types: Cigarettes   Start date: 1976  Smokeless Tobacco Never   Ready to quit: Not Answered Counseling given: Not Answered   Outpatient Medications Prior to Visit  Medication Sig Dispense Refill   albuterol (VENTOLIN HFA) 108 (90 Base) MCG/ACT inhaler Inhale 2 puffs into the lungs every 6 (six) hours as needed for wheezing or shortness of breath. 54 g 3   aspirin EC 81 MG EC tablet Take 1 tablet (81 mg total) by mouth daily with breakfast. 30 tablet 2   atorvastatin (LIPITOR) 80 MG tablet Take 1 tablet (80 mg total) by mouth daily at 6 PM. Patient needs to keep appointment for any future refills. 90 tablet 0   budesonide-formoterol (SYMBICORT) 160-4.5 MCG/ACT inhaler Inhale 2 puffs into the lungs 2 (two) times daily. 1 each 3   clopidogrel (PLAVIX) 75 MG tablet TAKE 1 TABLET (75 MG TOTAL) BY MOUTH DAILY. 90 tablet 1   fenofibrate (TRICOR) 145 MG tablet Take 1 tablet (145 mg total) by mouth daily. 90 tablet 0   folic acid (FOLVITE) 1 MG tablet Take 1 tablet (1 mg total) by mouth daily. 90 tablet 3   furosemide (LASIX) 20 MG tablet Take 3 tablets (60 mg total) by mouth daily. 90 tablet 6   isosorbide mononitrate (IMDUR) 60 MG 24 hr tablet Take 1.5 tablets (90 mg total) by mouth daily. 135 tablet 3   metFORMIN (GLUCOPHAGE) 500 MG tablet Take 1 tablet (500 mg total) by mouth 2 (two) times daily with a meal. 180 tablet  0   metoprolol tartrate (LOPRESSOR) 25 MG tablet TAKE 1 TABLET TWICE DAILY 180 tablet 1   nitroGLYCERIN (NITROSTAT) 0.4 MG SL tablet Place 1 tablet (0.4 mg total) under the tongue every 5 (five) minutes x 3 doses as needed for chest pain. 25 tablet 2   pantoprazole (PROTONIX) 40 MG tablet TAKE 1 TABLET TWICE DAILY 180 tablet 1   potassium chloride (KLOR-CON) 10 MEQ tablet Take 1 tablet (10 mEq total) by mouth daily. 30 tablet 3   tamsulosin (FLOMAX) 0.4 MG CAPS capsule Take 0.4 mg by mouth at bedtime.     thiamine 100 MG tablet Take 1 tablet (100 mg total) by mouth daily. 90 tablet 3  traZODone (DESYREL) 50 MG tablet Take 1 tablet (50 mg total) by mouth at bedtime. 90 tablet 0   valsartan (DIOVAN) 80 MG tablet Take 1 tablet (80 mg total) by mouth daily. 90 tablet 3   No facility-administered medications prior to visit.     Review of Systems:   Constitutional:   No  weight loss, night sweats,  Fevers, chills,  +fatigue, or  lassitude.  HEENT:   No headaches,  Difficulty swallowing,  Tooth/dental problems, or  Sore throat,                No sneezing, itching, ear ache, nasal congestion, post nasal drip,   CV:  No chest pain,  Orthopnea, PND, swelling in lower extremities, anasarca, dizziness, palpitations, syncope.   GI  No heartburn, indigestion, abdominal pain, nausea, vomiting, diarrhea, change in bowel habits, loss of appetite, bloody stools.   Resp: .  No chest wall deformity  Skin: no rash or lesions.  GU: no dysuria, change in color of urine, no urgency or frequency.  No flank pain, no hematuria   MS:  No joint pain or swelling.  No decreased range of motion.  No back pain.    Physical Exam  BP 102/60   Pulse 62   Ht 5\' 11"  (1.803 m)   Wt 218 lb (98.9 kg)   SpO2 94%   BMI 30.40 kg/m   GEN: A/Ox3; pleasant , NAD, well nourished    HEENT:  [place]/AT,   NOSE-clear, THROAT-clear, no lesions, no postnasal drip or exudate noted.   NECK:  Supple w/ fair ROM; no JVD;  normal carotid impulses w/o bruits; no thyromegaly or nodules palpated; no lymphadenopathy.    RESP  Clear  P & A; w/o, wheezes/ rales/ or rhonchi. no accessory muscle use, no dullness to percussion  CARD:  RRR, no m/r/g, tr  peripheral edema, pulses intact, no cyanosis or clubbing.  GI:   Soft & nt; nml bowel sounds; no organomegaly or masses detected.   Musco: Warm bil, no deformities or joint swelling noted.   Neuro: alert, no focal deficits noted.    Skin: Warm, no lesions or rashes    Lab Results:     Imaging: ECHOCARDIOGRAM COMPLETE  Result Date: 01/10/2021    ECHOCARDIOGRAM REPORT   Patient Name:   Jilberto Vanderwall. Date of Exam: 01/10/2021 Medical Rec #:  [id number]            Height:       71.0 in Accession #:    [id number]           Weight:       229.8 lb Date of Birth:  Nov 04, 1960            BSA:          2.237 m Patient Age:    76 years             BP:           140/80 mmHg Patient Gender: M                    HR:           66 bpm. Exam Location:  [facility] Procedure: 2D Echo, Cardiac Doppler and Color Doppler Indications:    I25.118 CAD  History:        Patient has prior history of Echocardiogram examinations, most  recent 11/27/2018. CAD, Alcohil abuse, Signs/Symptoms:Shortness                 of Breath and Syncope; Risk Factors:Obesity and Current Smoker.  Sonographer:    Coralyn Helling RDCS Referring Phys: [place]  Sonographer Comments: Patient is morbidly obese. Image acquisition challenging due to respiratory motion. IMPRESSIONS  1. Left ventricular ejection fraction, by estimation, is 55 to 60%. The left ventricle has normal function. The left ventricle has no regional wall motion abnormalities. The left ventricular internal cavity size was mildly dilated. Left ventricular diastolic parameters are consistent with Grade III diastolic dysfunction (restrictive). Elevated left ventricular end-diastolic pressure.  2. Right ventricular  systolic function is normal. The right ventricular size is normal. There is moderately elevated pulmonary artery systolic pressure. The estimated right ventricular systolic pressure is 26.2 mmHg.  3. The mitral valve is normal in structure. No evidence of mitral valve regurgitation. No evidence of mitral stenosis.  4. The aortic valve is tricuspid. Aortic valve regurgitation is not visualized. Mild to moderate aortic valve sclerosis/calcification is present, without any evidence of aortic stenosis.  5. Aortic dilatation noted. There is mild dilatation of the aortic root, measuring 41 mm. There is borderline dilatation of the ascending aorta, measuring 37 mm.  6. The inferior vena cava is dilated in size with >50% respiratory variability, suggesting right atrial pressure of 8 mmHg. FINDINGS  Left Ventricle: Left ventricular ejection fraction, by estimation, is 55 to 60%. The left ventricle has normal function. The left ventricle has no regional wall motion abnormalities. The left ventricular internal cavity size was mildly dilated. There is  no left ventricular hypertrophy. Left ventricular diastolic parameters are consistent with Grade III diastolic dysfunction (restrictive). Elevated left ventricular end-diastolic pressure. Right Ventricle: The right ventricular size is normal. No increase in right ventricular wall thickness. Right ventricular systolic function is normal. There is moderately elevated pulmonary artery systolic pressure. The tricuspid regurgitant velocity is 3.36 m/s, and with an assumed right atrial pressure of 8 mmHg, the estimated right ventricular systolic pressure is 03.5 mmHg. Left Atrium: Left atrial size was normal in size. Right Atrium: Right atrial size was normal in size. Pericardium: There is no evidence of pericardial effusion. Mitral Valve: The mitral valve is normal in structure. No evidence of mitral valve regurgitation. No evidence of mitral valve stenosis. Tricuspid Valve: The  tricuspid valve is normal in structure. Tricuspid valve regurgitation is trivial. No evidence of tricuspid stenosis. Aortic Valve: The aortic valve is tricuspid. Aortic valve regurgitation is not visualized. Mild to moderate aortic valve sclerosis/calcification is present, without any evidence of aortic stenosis. Pulmonic Valve: The pulmonic valve was normal in structure. Pulmonic valve regurgitation is not visualized. No evidence of pulmonic stenosis. Aorta: Aortic dilatation noted. There is mild dilatation of the aortic root, measuring 41 mm. There is borderline dilatation of the ascending aorta, measuring 37 mm. Venous: The inferior vena cava is dilated in size with greater than 50% respiratory variability, suggesting right atrial pressure of 8 mmHg. IAS/Shunts: The interatrial septum appears to be lipomatous. No atrial level shunt detected by color flow Doppler.  LEFT VENTRICLE PLAX 2D LVIDd:         5.90 cm  Diastology LVIDs:         4.10 cm  LV e' medial:    5.33 cm/s LV PW:         1.00 cm  LV E/e' medial:  25.7 LV IVS:  1.20 cm  LV e' lateral:   6.20 cm/s LVOT diam:     2.60 cm  LV E/e' lateral: 22.1 LV SV:         68 LV SV Index:   31 LVOT Area:     5.31 cm  RIGHT VENTRICLE            IVC RV S prime:     7.56 cm/s  IVC diam: 2.80 cm TAPSE (M-mode): 1.3 cm RVSP:           53.2 mmHg LEFT ATRIUM              Index       RIGHT ATRIUM           Index LA diam:        5.20 cm  2.32 cm/m  RA Pressure: 8.00 mmHg LA Vol (A2C):   141.0 ml 63.04 ml/m RA Area:     23.20 cm LA Vol (A4C):   89.9 ml  40.19 ml/m RA Volume:   74.80 ml  33.44 ml/m LA Biplane Vol: 114.0 ml 50.97 ml/m  AORTIC VALVE LVOT Vmax:   62.50 cm/s LVOT Vmean:  41.900 cm/s LVOT VTI:    0.129 m  AORTA Ao Root diam: 4.10 cm Ao Asc diam:  3.70 cm MV E velocity: 137.00 cm/s  TRICUSPID VALVE MV A velocity: 50.30 cm/s   TR Peak grad:   45.2 mmHg MV E/A ratio:  2.72         TR Vmax:        336.00 cm/s                             Estimated RAP:   8.00 mmHg                             RVSP:           53.2 mmHg                              SHUNTS                             Systemic VTI:  0.13 m                             Systemic Diam: 2.60 cm Fransico Him MD Electronically signed by Fransico Him MD Signature Date/Time: 01/10/2021/4:53:39 PM    Final       No flowsheet data found.  No results found for: NITRICOXIDE      Assessment & Plan:   TOBACCO ABUSE Smoking cessation discussed in detail  OSA (obstructive sleep apnea) ?? Severe obstructive sleep apnea.  Patient education on sleep apnea and CPAP usage.  We will send order to local DME company to change to AutoSet 10-20 to see if this is more comfortable.  We will get a CPAP download on return visit.  Plan  Patient Instructions  Adjust CPAP pressure 12 to 20cm auto .  Wear CPAP all night long .  Refer to LDCT screening program with Eric Form  Continue on Symbicort 2 puffs Twice daily  , rinse after use.  Albuterol inhaler As needed   Work on not smoking .  Follow up in  2 months with Dr. Melvyn Novas or Cledith Abdou NP with PFTs  and As needed   Please contact office for sooner follow up if symptoms do not improve or worsen or seek emergency care       Morbid obesity ([place]) Healthy weight loss discussed  Chronic diastolic CHF (congestive heart failure) ([place]) Appears euvolemic on exam.  Continue on diuretics and follow-up with cardiology.  Chronic bronchitis ([place]) Patient has a strong smoking history.  Suspect he has underlying COPD.  PFTs are pending.  Continue on Symbicort twice daily. Refer to the low-dose CT screening program  Plan Patient Instructions  Adjust CPAP pressure 12 to 20cm auto .  Wear CPAP all night long .  Refer to LDCT screening program with Eric Form  Continue on Symbicort 2 puffs Twice daily  , rinse after use.  Albuterol inhaler As needed   Work on not smoking .  Follow up in 2 months with Dr. Melvyn Novas or Sheehan Stacey NP with PFTs  and As needed   Please  contact office for sooner follow up if symptoms do not improve or worsen or seek emergency care         Rexene Edison, NP 01/28/2021

## 2021-01-29 LAB — BASIC METABOLIC PANEL
BUN/Creatinine Ratio: 16 (ref 9–20)
BUN: 16 mg/dL (ref 6–24)
CO2: 20 mmol/L (ref 20–29)
Calcium: 9.4 mg/dL (ref 8.7–10.2)
Chloride: 98 mmol/L (ref 96–106)
Creatinine, Ser: 0.99 mg/dL (ref 0.76–1.27)
Glucose: 99 mg/dL (ref 65–99)
Potassium: 4.6 mmol/L (ref 3.5–5.2)
Sodium: 141 mmol/L (ref 134–144)
eGFR: 88 mL/min/{1.73_m2} (ref >59)

## 2021-01-30 ENCOUNTER — Telehealth: Payer: Self-pay | Admitting: Adult Health

## 2021-01-30 NOTE — Telephone Encounter (Signed)
Please see message below from [employer].  New, [place] Levo, Jannifer Hick, [place]; Sheran Lawless, Gaylene Brooks Hello,   Patients cpap unit can only be a set pressure, his unit has no option for an auto setting. We updated the office on last setting changed order 01-20-21.    Please update the order to show a fixed setting if a change is still needed.   Thank you,   Demetrius Charity

## 2021-01-30 NOTE — Telephone Encounter (Signed)
TP please advise. Thanks  Please see message below from [employer].   [place], [place] Levo, Jannifer Hick, [place]; Sheran Lawless, Gaylene Brooks Hello,   Patients cpap unit can only be a set pressure, his unit has no option for an auto setting. We updated the office on last setting changed order 01-20-21.    Please update the order to show a fixed setting if a change is still needed.   Thank you,   Demetrius Charity

## 2021-02-03 NOTE — Telephone Encounter (Signed)
Adjust CPAP pressure 12-20cm auto    This is the setting that was sent in to [employer] on 06/21.    I have called the pt and LM on VM to check with him to see if the cpap setting did get changed to the above.

## 2021-02-04 ENCOUNTER — Telehealth: Payer: Self-pay | Admitting: Physician Assistant

## 2021-02-04 ENCOUNTER — Other Ambulatory Visit: Payer: Self-pay | Admitting: *Deleted

## 2021-02-04 DIAGNOSIS — Z87891 Personal history of nicotine dependence: Secondary | ICD-10-CM

## 2021-02-04 DIAGNOSIS — I5032 Chronic diastolic (congestive) heart failure: Secondary | ICD-10-CM

## 2021-02-04 DIAGNOSIS — F17210 Nicotine dependence, cigarettes, uncomplicated: Secondary | ICD-10-CM

## 2021-02-04 MED ORDER — SPIRONOLACTONE 25 MG PO TABS: 12.5000 mg | ORAL_TABLET | Freq: Every day | ORAL | 3 refills | Status: DC

## 2021-02-04 NOTE — Telephone Encounter (Signed)
Lmtcb for pt.  

## 2021-02-04 NOTE — Telephone Encounter (Signed)
Liliane Shi, PA-C  01/29/2021  4:55 PM EDT      Creatinine, K+ normal.   PLAN:  -DC K+ -Start Spironolactone 12.5 mg once daily -BMET 1 week and 2 weeks after starting Spironolactone  Richardson Dopp, PA-C    01/29/2021 4:52 PM    The patient has been notified of the result and verbalized understanding.  All questions (if any) were answered. Antonieta Iba, RN 02/04/2021 10:58 AM  Patient will stop potassium and start on spironolactone. Lab work has been scheduled.

## 2021-02-04 NOTE — Telephone Encounter (Signed)
Follow Up:      Pt is returning Jennifer's call, concerning lab results.

## 2021-02-05 NOTE — Telephone Encounter (Signed)
Called and spoke with patient. He stated that he was able to look at his cpap machine and it is currently set at 4cm.

## 2021-02-05 NOTE — Telephone Encounter (Signed)
Pt returned call. Stated to pt the messages we received from [employer] and asked him if he knew what his CPAP setting was on so we could be able to tell if they had been changed. Pt said that he was not currently at home and would call us back once he was able to look at his CPAP settings. Will kee encounter open while we await return call.

## 2021-02-11 NOTE — Telephone Encounter (Signed)
If his CPAP is on 4 cm , it does not go any lower than this .

## 2021-02-11 NOTE — Telephone Encounter (Signed)
Left message for patient to call back  

## 2021-02-12 ENCOUNTER — Other Ambulatory Visit: Payer: Self-pay

## 2021-02-12 ENCOUNTER — Other Ambulatory Visit: Payer: Medicare HMO

## 2021-02-12 DIAGNOSIS — I5032 Chronic diastolic (congestive) heart failure: Secondary | ICD-10-CM | POA: Diagnosis not present

## 2021-02-12 NOTE — Telephone Encounter (Signed)
Called and spoke to pt. Informed him of the information per TP. Pt verbalized understanding and denied any further questions or concerns at this time.

## 2021-02-13 ENCOUNTER — Other Ambulatory Visit: Payer: Self-pay | Admitting: Internal Medicine

## 2021-02-13 LAB — BASIC METABOLIC PANEL
BUN/Creatinine Ratio: 18 (ref 9–20)
BUN: 17 mg/dL (ref 6–24)
CO2: 26 mmol/L (ref 20–29)
Calcium: 9.6 mg/dL (ref 8.7–10.2)
Chloride: 100 mmol/L (ref 96–106)
Creatinine, Ser: 0.94 mg/dL (ref 0.76–1.27)
Glucose: 93 mg/dL (ref 65–99)
Potassium: 4.6 mmol/L (ref 3.5–5.2)
Sodium: 140 mmol/L (ref 134–144)
eGFR: 94 mL/min/{1.73_m2} (ref >59)

## 2021-02-19 ENCOUNTER — Other Ambulatory Visit: Payer: Self-pay

## 2021-02-19 ENCOUNTER — Institutional Professional Consult (permissible substitution): Payer: Medicare HMO | Admitting: Pulmonary Disease

## 2021-02-19 ENCOUNTER — Other Ambulatory Visit: Payer: Medicare HMO | Admitting: *Deleted

## 2021-02-19 DIAGNOSIS — I5032 Chronic diastolic (congestive) heart failure: Secondary | ICD-10-CM | POA: Diagnosis not present

## 2021-02-20 LAB — BASIC METABOLIC PANEL
BUN/Creatinine Ratio: 16 (ref 9–20)
BUN: 16 mg/dL (ref 6–24)
CO2: 23 mmol/L (ref 20–29)
Calcium: 9.1 mg/dL (ref 8.7–10.2)
Chloride: 102 mmol/L (ref 96–106)
Creatinine, Ser: 1.02 mg/dL (ref 0.76–1.27)
Glucose: 108 mg/dL — ABNORMAL HIGH (ref 65–99)
Potassium: 4.3 mmol/L (ref 3.5–5.2)
Sodium: 142 mmol/L (ref 134–144)
eGFR: 85 mL/min/{1.73_m2} (ref >59)

## 2021-02-24 DIAGNOSIS — J9611 Chronic respiratory failure with hypoxia: Secondary | ICD-10-CM | POA: Diagnosis not present

## 2021-02-24 DIAGNOSIS — J449 Chronic obstructive pulmonary disease, unspecified: Secondary | ICD-10-CM | POA: Diagnosis not present

## 2021-03-07 ENCOUNTER — Telehealth: Payer: Self-pay | Admitting: Cardiovascular Disease

## 2021-03-07 ENCOUNTER — Telehealth: Payer: Self-pay | Admitting: *Deleted

## 2021-03-07 ENCOUNTER — Other Ambulatory Visit: Payer: Self-pay

## 2021-03-07 DIAGNOSIS — I509 Heart failure, unspecified: Secondary | ICD-10-CM

## 2021-03-07 NOTE — Telephone Encounter (Signed)
Spoke with the pts sister on [place], and she reports that the pt is struggling with med management at home... he has visual issues and he lives alone with no help and he cannot keep up with all of the changes between Cardio and his PCP.   We went over his med list to be sure up to date and I also sent a message to social work to see if any programs that can be helpful to the pt.   She will also talk with his PCP, Dr. [name], re: possible Home Health.   I advised her that I will call her back after talking with the social worker and any added health help I can find that can be a benefit to the pt.   Pt has OV 04/29/21.

## 2021-03-07 NOTE — Telephone Encounter (Signed)
Recommendations for medication management are as follows:  - Pill packs through [place] (I called his pharmacy [facility] and they do not offer this); they are able to do free delivery to many addresses, and they can try and make it easier for the pt to figure out what needs to be punched out. The challenge for this system will arise if he truly does have lots of changes, sometimes the pharmacy isn't able to keep up with the bubble packs.   - If he doesn't have the ability to do those pill packs it may be best to streamline his medications to one of the [facility] ([facility], [facility]) that way he has one pharmacy managing them all. [facility] does have a mail order pharmacy that he could sign up for also. If interested I can send them that form.   - Finally I always recommend a referral to [facility] for pharmacy services, even those these are also time limited they may be able to work with him to find a good system.  Offered to call pt as needed.   Westley Hummer, MSW, [place]  [phone]

## 2021-03-07 NOTE — Telephone Encounter (Addendum)
Spoke with [name] and I advised her that I placed a referral to [facility] and I will send in his cardio RX to [facility] for the autoship excluding Plavix and his diuretics.     I gave her the options sent to [place] by the social worker and she was very Patent attorney. She will consider all options and let us know what we can do to help.   She says the pt struggles with transportation and is asking if she can get him help to his [facility] appts. I advised her that I will reach back out to the SW and hopefully someone will reach out to her or the pt.

## 2021-03-07 NOTE — Chronic Care Management (AMB) (Signed)
  Chronic Care Management   Note  03/07/2021 Name: Jacey Pelc. MRN: [id number] DOB: [birth year]  [name] is a 60 y.o. year old male who is a primary care patient of Colon Branch, MD. I reached out to [name] by phone today in response to a referral sent by Mr. JESSEY STEHLIN Jr.'s PCP Colon Branch, MD     Mr. Nies was given information about Chronic Care Management services today including:  CCM service includes personalized support from designated clinical staff supervised by his physician, including individualized plan of care and coordination with other care providers 24/7 contact phone numbers for assistance for urgent and routine care needs. Service will only be billed when office clinical staff spend 20 minutes or more in a month to coordinate care. Only one practitioner may furnish and bill the service in a calendar month. The patient may stop CCM services at any time (effective at the end of the month) by phone call to the office staff. The patient will be responsible for cost sharing (co-pay) of up to 20% of the service fee (after annual deductible is met).  Patient agreed to services and verbal consent obtained.   Follow up plan: Telephone appointment with care management team member scheduled for: 03/11/2021 and 03/18/2021  Julian Hy, [place] [facility]  Direct Dial: [phone]

## 2021-03-07 NOTE — Telephone Encounter (Signed)
Pt c/o medication issue: 1. Name of Medication: spironolactone, furosemide  2. How are you currently taking this medication (dosage and times per day)? 1/2 tab Spironolactone and furosemide 3 tabs a day 3. Are you having a reaction (difficulty breathing--STAT)?  No 4. What is your medication issue? Patient would like to know if this a mo. To mo. Medication or long term. Please advise.

## 2021-03-07 NOTE — Addendum Note (Signed)
Addended by: Stephani Police on: 03/07/2021 11:12 AM   Modules accepted: Orders

## 2021-03-08 ENCOUNTER — Other Ambulatory Visit: Payer: Self-pay

## 2021-03-08 ENCOUNTER — Other Ambulatory Visit: Payer: Self-pay | Admitting: Internal Medicine

## 2021-03-08 MED ORDER — ISOSORBIDE MONONITRATE ER 60 MG PO TB24
90.0000 mg | ORAL_TABLET | Freq: Every day | ORAL | 3 refills | Status: DC
Start: 2021-03-08 — End: 2022-01-26

## 2021-03-08 MED ORDER — METOPROLOL TARTRATE 25 MG PO TABS: 25.0000 mg | ORAL_TABLET | Freq: Two times a day (BID) | ORAL | 3 refills | Status: DC

## 2021-03-08 MED ORDER — VALSARTAN 80 MG PO TABS: 80.0000 mg | ORAL_TABLET | Freq: Every day | ORAL | 3 refills | Status: DC

## 2021-03-08 MED ORDER — ATORVASTATIN CALCIUM 80 MG PO TABS: 80.0000 mg | ORAL_TABLET | Freq: Every day | ORAL | 1 refills | Status: DC

## 2021-03-10 ENCOUNTER — Ambulatory Visit (INDEPENDENT_AMBULATORY_CARE_PROVIDER_SITE_OTHER): Payer: Medicare HMO | Admitting: Acute Care

## 2021-03-10 ENCOUNTER — Other Ambulatory Visit: Payer: Self-pay

## 2021-03-10 ENCOUNTER — Ambulatory Visit (INDEPENDENT_AMBULATORY_CARE_PROVIDER_SITE_OTHER)
Admission: RE | Admit: 2021-03-10 | Discharge: 2021-03-10 | Disposition: A | Discharge status: Home / Self Care | Payer: Medicare HMO | Source: Ambulatory Visit | Attending: Acute Care | Admitting: Acute Care

## 2021-03-10 ENCOUNTER — Encounter: Payer: Self-pay | Admitting: Acute Care

## 2021-03-10 VITALS — BP 124/72 | HR 56 | Temp 97.3°F | Ht 71.0 in | Wt 217.2 lb

## 2021-03-10 DIAGNOSIS — Z87891 Personal history of nicotine dependence: Secondary | ICD-10-CM | POA: Diagnosis not present

## 2021-03-10 DIAGNOSIS — F17210 Nicotine dependence, cigarettes, uncomplicated: Secondary | ICD-10-CM

## 2021-03-10 MED ORDER — NITROGLYCERIN 0.4 MG SL SUBL: 0.4000 mg | SUBLINGUAL_TABLET | SUBLINGUAL | 2 refills | Status: DC | PRN

## 2021-03-10 MED ORDER — FENOFIBRATE 145 MG PO TABS: 145.0000 mg | ORAL_TABLET | Freq: Every day | ORAL | 0 refills | Status: DC

## 2021-03-10 MED ORDER — TRAZODONE HCL 50 MG PO TABS: 50.0000 mg | ORAL_TABLET | Freq: Every day | ORAL | 0 refills | Status: DC

## 2021-03-10 MED ORDER — BUDESONIDE-FORMOTEROL FUMARATE 160-4.5 MCG/ACT IN AERO
2.0000 | INHALATION_SPRAY | Freq: Two times a day (BID) | RESPIRATORY_TRACT | 3 refills | Status: DC
Start: 2021-03-10 — End: 2021-05-21

## 2021-03-10 NOTE — Telephone Encounter (Signed)
Pt referred for Transportation Services to [employer]. Pt enrollment sent to [employer]. I have also mailed an [place] application and additional transportation resources to pt this morning.   Westley Hummer, MSW, [place]  [phone]

## 2021-03-10 NOTE — Progress Notes (Signed)
Virtual Visit via Video Note  I connected with [name] on 03/10/21 at  2:00 PM EDT by a video enabled telemedicine application and verified that I am speaking with the correct person using two identifiers.  Virtual Visit via Telephone Note  I connected with [name] on 03/10/21 at  2:00 PM EDT by telephone and verified that I am speaking with the correct person using two identifiers.  Location: Patient: At home Provider: [place], [place], [place], [address]    I discussed the limitations, risks, security and privacy concerns of performing an evaluation and management service by telephone and the availability of in person appointments. I also discussed with the patient that there may be a patient responsible charge related to this service. The patient expressed understanding and agreed to proceed.    Shared Decision Making Visit Lung Cancer Screening Program [phone])   Eligibility: Age 34 y.o. Pack Years Smoking History Calculation 90 pack year smoking hisotry (# packs/per year x # years smoked) Recent History of coughing up blood  no Unexplained weight loss? no ( >Than 15 pounds within the last 6 months ) Prior History Lung / other cancer no (Diagnosis within the last 5 years already requiring surveillance chest CT Scans). Smoking Status Current Smoker Former Smokers: Years since quit: NA  Quit Date: NA  Visit Components: Discussion included one or more decision making aids. yes Discussion included risk/benefits of screening. yes Discussion included potential follow up diagnostic testing for abnormal scans. yes Discussion included meaning and risk of over diagnosis. yes Discussion included meaning and risk of False Positives. yes Discussion included meaning of total radiation exposure. yes  Counseling Included: Importance of adherence to annual lung cancer LDCT screening. yes Impact of comorbidities on ability to participate in the  program. yes Ability and willingness to under diagnostic treatment. yes  Smoking Cessation Counseling: Current Smokers:  Discussed importance of smoking cessation. yes Information about tobacco cessation classes and interventions provided to patient. yes Patient provided with "ticket" for LDCT Scan. yes Symptomatic Patient. no  Counseling Diagnosis Code: Tobacco Use Z72.0 Asymptomatic Patient yes  Counseling (Intermediate counseling: > three minutes counseling) [id number] Former Smokers:  Discussed the importance of maintaining cigarette abstinence. yes Diagnosis Code: Personal History of Nicotine Dependence. [id number] Information about tobacco cessation classes and interventions provided to patient. Yes Patient provided with "ticket" for LDCT Scan. yes Written Order for Lung Cancer Screening with LDCT placed in Epic. Yes (CT Chest Lung Cancer Screening Low Dose W/O CM) [id number] Z12.2-Screening of respiratory organs Z87.891-Personal history of nicotine dependence  I have spent 25 minutes of face to face time with Mr. Nesbitt discussing the risks and benefits of lung cancer screening. We viewed a power point together that explained in detail the above noted topics. We paused at intervals to allow for questions to be asked and answered to ensure understanding.We discussed that the single most powerful action that he can take to decrease he risk of developing lung cancer is to quit smoking. We discussed whether or not he is ready to commit to setting a quit date. We discussed options for tools to aid in quitting smoking including nicotine replacement therapy, non-nicotine medications, support groups, Quit Smart classes, and behavior modification. We discussed that often times setting smaller, more achievable goals, such as eliminating 1 cigarette a day for a week and then 2 cigarettes a day for a week can be helpful in slowly decreasing the number of cigarettes smoked. This  allows for a sense of  accomplishment as well as providing a clinical benefit. I gave him the " Be Stronger Than Your Excuses" card with contact information for community resources, classes, free nicotine replacement therapy, and access to mobile apps, text messaging, and on-line smoking cessation help. I have also given him my card and contact information in the event he needs to contact me. We discussed the time and location of the scan, and that either Doroteo Glassman RN or I will call with the results within 24-48 hours of receiving them. I have offered him  a copy of the power point we viewed  as a resource in the event they need reinforcement of the concepts we discussed today in the office. The patient verbalized understanding of all of  the above and had no further questions upon leaving the office. They have my contact information in the event they have any further questions.  I spent 3-4 minutes counseling on smoking cessation and the health risks of continued tobacco abuse.  I explained to the patient that there has been a high incidence of coronary artery disease noted on these exams. I explained that this is a non-gated exam therefore degree or severity cannot be determined. This patient is on statin therapy. I have asked the patient to follow-up with their PCP regarding any incidental finding of coronary artery disease and management with diet or medication as their PCP  feels is clinically indicated. The patient verbalized understanding of the above and had no further questions upon completion of the visit.      Magdalen Spatz, NP 03/10/2021

## 2021-03-10 NOTE — Patient Instructions (Signed)
Thank you for participating in the [facility] Lung Cancer Screening Program. It was our pleasure to meet you today. We will call you with the results of your scan within the next few days. Your scan will be assigned a Lung RADS category score by the physicians reading the scans.  This Lung RADS score determines follow up scanning.  See below for description of categories, and follow up screening recommendations. We will be in touch to schedule your follow up screening annually or based on recommendations of our providers. We will fax a copy of your scan results to your Primary Care Physician, or the physician who referred you to the program, to ensure they have the results. Please call the office if you have any questions or concerns regarding your scanning experience or results.  Our office number is [phone]. Please speak with Denise Phelps, RN. She is our Lung Cancer Screening RN. If she is unavailable when you call, please have the office staff send her a message. She will return your call at her earliest convenience. Remember, if your scan is normal, we will scan you annually as long as you continue to meet the criteria for the program. (Age 55-77, Current smoker or smoker who has quit within the last 15 years). If you are a smoker, remember, quitting is the single most powerful action that you can take to decrease your risk of lung cancer and other pulmonary, breathing related problems. We know quitting is hard, and we are here to help.  Please let us know if there is anything we can do to help you meet your goal of quitting. If you are a former smoker, congratulations. We are proud of you! Remain smoke free! Remember you can refer friends or family members through the number above.  We will screen them to make sure they meet criteria for the program. Thank you for helping us take better care of you by participating in Lung Screening.  Lung RADS Categories:  Lung RADS 1: no nodules  or definitely non-concerning nodules.  Recommendation is for a repeat annual scan in 12 months.  Lung RADS 2:  nodules that are non-concerning in appearance and behavior with a very low likelihood of becoming an active cancer. Recommendation is for a repeat annual scan in 12 months.  Lung RADS 3: nodules that are probably non-concerning , includes nodules with a low likelihood of becoming an active cancer.  Recommendation is for a 6-month repeat screening scan. Often noted after an upper respiratory illness. We will be in touch to make sure you have no questions, and to schedule your 6-month scan.  Lung RADS 4 A: nodules with concerning findings, recommendation is most often for a follow up scan in 3 months or additional testing based on our provider's assessment of the scan. We will be in touch to make sure you have no questions and to schedule the recommended 3 month follow up scan.  Lung RADS 4 B:  indicates findings that are concerning. We will be in touch with you to schedule additional diagnostic testing based on our provider's  assessment of the scan.   

## 2021-03-11 ENCOUNTER — Telehealth: Payer: Medicare HMO

## 2021-03-11 ENCOUNTER — Telehealth: Payer: Self-pay

## 2021-03-11 NOTE — Telephone Encounter (Signed)
  Care Management   Follow Up Note   03/11/2021 Name: [name]. MRN: [id number] DOB: 03/28/1961   Referred by: Colon Branch, MD Reason for referral : Chronic Care Management (RNCM Initial assessment)   An unsuccessful telephone outreach was attempted today. The patient was referred to the case management team for assistance with care management and care coordination.   Follow Up Plan: The care management team will reach out to the patient again over the next 30 days.   Thea Silversmith, RN, MSN, BSN, CCM Care Management Coordinator [facility] [phone]

## 2021-03-12 ENCOUNTER — Telehealth: Payer: Self-pay | Admitting: *Deleted

## 2021-03-12 NOTE — Chronic Care Management (AMB) (Signed)
  Care Management   Note  03/12/2021 Name: [name]. MRN: [id number] DOB: [birth year]  [name]. is a 60 y.o. year old male who is a primary care patient of Colon Branch, MD and is actively engaged with the care management team. I reached out to [name]. by phone today to assist with re-scheduling an initial visit with the RN Case Manager  Follow up plan: Unsuccessful telephone outreach attempt made. A HIPAA compliant phone message was left for the patient providing contact information and requesting a return call.   Julian Hy, [place] Management  Direct Dial: [phone]

## 2021-03-12 NOTE — Chronic Care Management (AMB) (Signed)
  Care Management   Note  03/12/2021 Name: [name]. MRN: [id number] DOB: 12-10-1960  [name] is a 60 y.o. year old male who is a primary care patient of Colon Branch, MD and is actively engaged with the care management team. I reached out to [name] by phone today to assist with re-scheduling an initial visit with the RN Case Manager  Follow up plan: Unsuccessful telephone outreach attempt made. A HIPAA compliant phone message was left for the patient providing contact information and requesting a return call.   Provider schedule change - LM for return call to reschedule appt  Julian Hy, [place] Management  Direct Dial: [phone]

## 2021-03-12 NOTE — Chronic Care Management (AMB) (Signed)
  Care Management   Note  03/12/2021 Name: Yarel Dildy. MRN: [id number] DOB: 1961-06-22  [name] is a 60 y.o. year old male who is a primary care patient of Colon Branch, MD and is actively engaged with the care management team. I reached out to [name] by phone today to assist with re-scheduling an initial visit with the RN Case Manager  Follow up plan: Telephone appointment with care management team member scheduled for: 03/18/2021  [facility]

## 2021-03-13 ENCOUNTER — Telehealth: Payer: Medicare HMO

## 2021-03-14 NOTE — Chronic Care Management (AMB) (Signed)
  Care Management   Note  03/14/2021 Name: Aven Essa. MRN: [id number] DOB: 05-Feb-1961  [name] is a 60 y.o. year old male who is a primary care patient of Colon Branch, MD and is actively engaged with the care management team. I reached out to [name] by phone today to assist with re-scheduling an initial visit with the RN Case Manager - clinician schedule change  Follow up plan: Unsuccessful telephone outreach attempt made. A HIPAA compliant phone message was left for the patient providing contact information and requesting a return call.   Julian Hy, [place] Management  Direct Dial: [phone]

## 2021-03-17 ENCOUNTER — Telehealth: Payer: Self-pay | Admitting: Cardiovascular Disease

## 2021-03-17 ENCOUNTER — Telehealth: Payer: Self-pay | Admitting: Licensed Clinical Social Worker

## 2021-03-17 NOTE — Telephone Encounter (Signed)
Good Morning, Thank you for your time and assistance this morning.  The number I provided you earlier for [employer] is [phone]. This service can take him to [facility] appointments. This includes the following appointments: Mar 31, 2021 Pulmonary Functions Test Monday August 22 3:00 PM [facility] [address] [place] [address] [phone]  Mar 31, 2021 Office Visit with Christinia Gully, MD Monday August 22 4:00 PM (Arrive by 3:45 PM) Please arrive 15 minutes prior to your appointment. This will allow [place] to verify and update your medical record and ensure a full appointment for you within the time allotted. [facility] [address] [place] [address] [phone]  Apr 29, 2021 Office Visit with Richardson Dopp, PA-C Tuesday September 20 2:15 PM (Arrive by 2:00 PM) Please arrive 15 minutes prior to your appointment. This will allow [place] to verify and update your medical record and ensure a full appointment for you within the time allotted. [facility] [address], [place] [address] [phone]   I also have attached a list of additional transportation resources in the [facility] area.   I meant to ask- has your brother ever applied for Medicaid/Disability? If he is eligible for those programs then he should apply as it also may allow him to be eligible for Medicaid transportation through the county.  I have attached additional guidance for that and remain available if you have any additional questions!  [place], LCSW  [place], MSW, [email or web address] Work Cell: [place]

## 2021-03-17 NOTE — Chronic Care Management (AMB) (Signed)
  Care Management   Note  03/17/2021 Name: [name]. MRN: [id number] DOB: 1961-05-28  [name]. is a 60 y.o. year old male who is a primary care patient of Colon Branch, MD and is actively engaged with the care management team. I reached out to [name]. by phone today to assist with re-scheduling an initial visit with the RN Case Manager  Follow up plan: Telephone appointment with care management team member scheduled for: 03/25/2021  Julian Hy, [place], [place] Management  Direct Dial: [phone]

## 2021-03-17 NOTE — Telephone Encounter (Signed)
LCSW received response back from [employer] stating that they have tried to reach pt a few times with no response. Aware pt sister assists with care coordination, DPR on file. I called [name] at [phone], was able to reach her this morning. I introduced self, role, reason for call. Pt sister shares he is not savvy with his phone and she is not sure he knows how to check his voicemail.   She confirms he is in need of transportation assistance as needed. He has rides through his son and another family member but sometimes they are unable to make it work. I provided [facility] number, did a quick overview of their services, and that pt can call and go over the waiver form verbally. She is aware that pt should call 1-2 days in advance of appointments if possible. I received permission to send her additional resources to her e-mail at [email or web address].   I also made sister aware that pt had missed two calls from [facility] and has another call from pharmacist tomorrow at 3pm. Pt sister will update pt wit missed calls and encourage him to pick up the phone tomorrow anytime between 2:45pm and 3:15pm to ensure he does not miss call.   I remain available and encouraged pt sister and pt to let me know if any other concerns/questions arise.   Westley Hummer, MSW, [place]  [phone]

## 2021-03-17 NOTE — Telephone Encounter (Signed)
*  STAT* If patient is at the pharmacy, call can be transferred to refill team.   1. Which medications need to be refilled? (please list name of each medication and dose if known)  spironolactone (ALDACTONE) 25 MG tablet furosemide (LASIX) 20 MG tablet  2. Which pharmacy/location (including street and city if local pharmacy) is medication to be sent to? [facility] - [place], [place]  3. Do they need a 30 day or 90 day supply?  90 day supply  For the Spironolactone, patient's sister states the patient will run out soon and she is hoping to have a 10 day supply sent to his local pharmacy ([place], [place]

## 2021-03-18 ENCOUNTER — Telehealth: Payer: Medicare HMO

## 2021-03-18 ENCOUNTER — Ambulatory Visit (INDEPENDENT_AMBULATORY_CARE_PROVIDER_SITE_OTHER): Payer: Medicare HMO | Admitting: Pharmacist

## 2021-03-18 DIAGNOSIS — I5032 Chronic diastolic (congestive) heart failure: Secondary | ICD-10-CM

## 2021-03-18 DIAGNOSIS — J449 Chronic obstructive pulmonary disease, unspecified: Secondary | ICD-10-CM

## 2021-03-18 DIAGNOSIS — Z9119 Patient's noncompliance with other medical treatment and regimen: Secondary | ICD-10-CM

## 2021-03-18 DIAGNOSIS — Z91199 Patient's noncompliance with other medical treatment and regimen due to unspecified reason: Secondary | ICD-10-CM

## 2021-03-18 DIAGNOSIS — I25118 Atherosclerotic heart disease of native coronary artery with other forms of angina pectoris: Secondary | ICD-10-CM

## 2021-03-18 DIAGNOSIS — E785 Hyperlipidemia, unspecified: Secondary | ICD-10-CM

## 2021-03-18 DIAGNOSIS — E1151 Type 2 diabetes mellitus with diabetic peripheral angiopathy without gangrene: Secondary | ICD-10-CM

## 2021-03-18 DIAGNOSIS — G4733 Obstructive sleep apnea (adult) (pediatric): Secondary | ICD-10-CM

## 2021-03-18 MED ORDER — FUROSEMIDE 20 MG PO TABS: 60.0000 mg | ORAL_TABLET | Freq: Every day | ORAL | 6 refills | Status: DC

## 2021-03-18 MED ORDER — SPIRONOLACTONE 25 MG PO TABS: 12.5000 mg | ORAL_TABLET | Freq: Every day | ORAL | 3 refills | Status: DC

## 2021-03-18 NOTE — Telephone Encounter (Signed)
Refills sent to [facility], per pt request.

## 2021-03-21 ENCOUNTER — Other Ambulatory Visit: Payer: Self-pay | Admitting: Internal Medicine

## 2021-03-21 NOTE — Progress Notes (Signed)
Please call patient and let them  know their  low dose Ct was read as a Lung RADS 2: nodules that are benign in appearance and behavior with a very low likelihood of becoming a clinically active cancer due to size or lack of growth. Recommendation per radiology is for a repeat LDCT in 12 months. .Please let them  know we will order and schedule their  annual screening scan for 03/2022. Please let them  know there was notation of CAD on their  scan.  Please remind the patient  that this is a non-gated exam therefore degree or severity of disease  cannot be determined. Please have them  follow up with their PCP regarding potential risk factor modification, dietary therapy or pharmacologic therapy if clinically indicated. Pt.  is  currently on statin therapy. Please place order for annual  screening scan for  02/2022 and fax results to PCP. Thanks so much.  There was notation of aortic atherosclerosis and hiatal hernia. He is on statin therapy and is followed by cardiology. Have him follow up with PCP. Thanks so much

## 2021-03-23 NOTE — Patient Instructions (Signed)
[name] It was a pleasure speaking with you today.  I have attached a summary of our visit today and information about your health goals.  If you have any questions or concerns, please feel free to contact me either at the phone number below or with a MyChart message.   Keep up the good work!  [name], PharmD Clinical Pharmacist [facility] [phone] (direct line)  [phone] (main office number)   PATIENT GOALS:   Goals Addressed             This Visit's Progress    Pharmacy Chronic Care Management Plan       Current Barriers:  Unable to independently monitor therapeutic efficacy Unable to achieve control of Hyperlipidemia / high cholesterol Unable to maintain control of blood pressure and heart disease Does not adhere to prescribed medication regimen Does not contact provider office for questions/concerns  Pharmacist Clinical Goal(s):  Over the next 90 days, patient will verbalize ability to afford treatment regimen achieve adherence to monitoring guidelines and medication adherence to achieve therapeutic efficacy achieve control of Hyperlipidemia as evidenced by LDL <70 maintain control of Heart disease, blood pressure and type 2 diabetes as evidenced by attaining goals listed below  adhere to prescribed medication regimen as evidenced by refill history contact provider office for questions/concerns as evidenced notation of same in electronic health record through collaboration with PharmD and provider.   Interventions: 1:1 collaboration with Kenneth Branch, MD regarding development and update of comprehensive plan of care as evidenced by provider attestation and co-signature Inter-disciplinary care team collaboration (see longitudinal plan of care) Comprehensive medication review performed; medication list updated in electronic medical record  Diabetes: Uncontrolled; Goal A1c < 6.5% Last A1c was 7.0% (07/10/2020) Current  treatment:  None Interventions:   Counseled on A1c goal Reviewed home blood goals  Fasting blood glucose goal (before meals) = 80 to 130 Blood glucose goal after a meal = less than 180  Recommend recheck A1c with next labs. If >6.5% consider starting medication therapy  Hypertension / Heart Disease:  Goal: decreased symptoms of heart disease, prevent exacerbations and BP <140/90.  Current treatment: Furosemide 78m - take 3 tablets = 650mdaily in the morning Metoprolol tartrate 2537mwice a day Spironolactone 85m82mtake 0.5 tablet = 12.5mg 57mh morning Valsartan 80mg 53my Interventions:  Coordinated with [employer] mail order to get 90 day supply for furosemide. You should receive this prescription in the mail around 03/24/2021 Make sure you are only taking 0.5 tablet (one-half) of spironolactone daily. If you need a refill before insurance will allow you can ask [facility] to fill 15 tablets = 30 days of therapy off insurance. Cost should not be too high.  Reviewed blood pressure goal Continue to check blood pressure 2 to 3 times a week, record and bring to future appointments. Discussed signs and symptoms of worsening heart disease weight gain, shortness of breath, abdominal fullness, swelling in legs or abdomen, Fatigue and weakness, changes in ability to perform usual activities, persistent cough or wheezing with white or pink blood-tinged mucus, nausea and lack of appetite. Contact cardiologist or primary care physician if you experience any of these symptoms.  Recommend checking weigh every day. Report weight gain of more than 3 lbs in 24 hours or 5 lbs in 1 week.  Hyperlipidemia with elevated triglycerides / history of stroke: LDL goal <70; Triglycerides goal <150 Last LDL was 116 and Triglycerides were 97 Current  treatment: Atorvastatin 103m daily Fenofibrate 1419mdaily Aspirin 8143maily with breakfast Clopidogrel 71m42mily  Isosorbide ER 60mg11make 1.5 tablets =  90mg 23my Nitroglycerin 0.4mg - 16mce 1 tablet under the tongue and allow to dissolve as needed for chest pain Interventions:  Education provided about LDL and triglyceride goal Discussed benefits of getting LDL to goal and taking atorvastatin daily for worsening of heart disease / stroke Recommend recheck lipid panel with next labs; If LDL > 70, consider restarting Repatha or similar medication (pharmacy team will help with applying for assistance with Repatha cost).   Chronic Obstructive Pulmonary Disease / sleep apnea: Current treatment: CPAP Symbicort (budesonide/formoterol) 160/4.5 - inhale 2 puffs into lungs twice a day Interventions:  Coordinated with DME for trial of different mask to help him tolerate CPAP better Continue to follow up with pulmonologist  Continue current medications Reminded patient to rinse mouth after using Symbicort.   Medication Management:  Goal: take medications as prescribed; consult clinical pharmacist if any questions or issues with medication access Current Pharmacy:  locally uses [facility].  [employer] Mail Order Interventions:  Discussed using either color stickers or color pill containers to help patient identify morning, afternoon, evening and night medications Coordinated refills with [employer] for 90 day supply for furosemide and tamsulosin.  Reviewed each medication with patient and discussed how to take and reason for taking.   Patient Goals/Self-Care Activities Over the next 90 days, patient will:  Take medications as prescribed, focus on medication adherence by utilizing mail order pharmacy and contacting clinical pharmacist about any medications questions or concerns Check blood pressure 2 to 3 times per week, document, and provide at future appointments,  Weigh daily, and contact provider if weight gain of more than 3 lbs in 24 hours or 5 lbs in 1 week Collaborate with provider on medication access solutions  Follow Up Plan:  Telephone follow up appointment with care management team member scheduled for:  2 to 3 weeks           Consent to CCM Services: [name] Ihnenven information about Chronic Care Management services including:  CCM service includes personalized support from designated clinical staff supervised by his physician, including individualized plan of care and coordination with other care providers 24/7 contact phone numbers for assistance for urgent and routine care needs. Service will only be billed when office clinical staff spend 20 minutes or more in a month to coordinate care. Only one practitioner may furnish and bill the service in a calendar month. The patient may stop CCM services at any time (effective at the end of the month) by phone call to the office staff. The patient will be responsible for cost sharing (co-pay) of up to 20% of the service fee (after annual deductible is met).  Patient agreed to services and verbal consent obtained.   The patient verbalized understanding of instructions, educational materials, and care plan provided today and agreed to receive a mailed copy of patient instructions, educational materials, and care plan.   Telephone follow up appointment with care management team member scheduled for: 04/03/2021

## 2021-03-23 NOTE — Chronic Care Management (AMB) (Signed)
Chronic Care Management Pharmacy Note  03/23/2021 Name:  [name]. MRN:  [id number] DOB:  06/06/1961   Subjective: [name]. is an 60 y.o. year old male who is a primary patient of [name], [name], MD.  The CCM team was consulted for assistance with disease management and care coordination needs.    Engaged with patient by telephone for initial visit in response to provider referral for pharmacy case management and/or care coordination services.   Consent to Services:  The patient was given the following information about Chronic Care Management services today, agreed to services, and gave verbal consent: 1. CCM service includes personalized support from designated clinical staff supervised by the primary care provider, including individualized plan of care and coordination with other care providers 2. 24/7 contact phone numbers for assistance for urgent and routine care needs. 3. Service will only be billed when office clinical staff spend 20 minutes or more in a month to coordinate care. 4. Only one practitioner may furnish and bill the service in a calendar month. 5.The patient may stop CCM services at any time (effective at the end of the month) by phone call to the office staff. 6. The patient will be responsible for cost sharing (co-pay) of up to 20% of the service fee (after annual deductible is met). Patient agreed to services and consent obtained.  Patient Care Team: Colon Branch, MD as PCP - General (Internal Medicine) Burnell Blanks, MD as PCP - Cardiology (Cardiology) Ralene Bathe, MD as Consulting Physician (Ophthalmology) Pyrtle, Lajuan Lines, MD as Consulting Physician (Gastroenterology) Susa Day, MD as Consulting Physician (Orthopedic Surgery) Luretha Rued, RN as Case Manager Cherre Robins, PharmD (Pharmacist) Rozetta Nunnery, MD as Consulting Physician (Otolaryngology)  Recent office visits: 10/22/2020 - PCP (Dr [name]) - F/U chronic  conditions. Removed Repatha from med list as pt reported no longer taking due to cost. Patient also was not taking metformin - new Rx sent. Recommended restart trazodone for depression / insomnia. Referral for outpatient speach therapy.   Recent consult visits: 03/10/2021 - Pulm Elie Confer, NP) Phone Visit - Lung Cancer Screeing education session. CT scheduled.  01/28/2021 - Pulm (Parrett, NP) F/U cough OSA and SOB. Suspects underlying COPD - ordered PFTs. Recommended restart CPAP. Ordered DME to change CPAP to Auto set 10-20. 01/28/2021 Cardio ([facility]) F/U CHFpEF. Stopped potassium supplement; Started spironolacton 87m - take 0.5 tablet daily. Consider SGLT2 or Entresto in future.   Hospital visits: None in previous 6 months  Objective:  Lab Results  Component Value Date   CREATININE 1.02 02/19/2021   CREATININE 0.94 02/12/2021   CREATININE 0.99 01/28/2021    Lab Results  Component Value Date   HGBA1C 7.0 (H) 07/10/2020   Last diabetic Eye exam: No results found for: HMDIABEYEEXA  Last diabetic Foot exam: No results found for: HMDIABFOOTEX      Component Value Date/Time   CHOL 171 11/14/2019 1441   TRIG 97 11/14/2019 1441   HDL 37 (L) 11/14/2019 1441   CHOLHDL 4.6 11/14/2019 1441   CHOLHDL 4.3 11/27/2018 0441   VLDL 35 11/27/2018 0441   LDLCALC 116 (H) 11/14/2019 1441   LDLDIRECT 116.0 05/12/2013 1517    Hepatic Function Latest Ref Rng & Units 07/10/2020 11/26/2018 08/24/2017  Total Protein 6.0 - 8.3 g/dL 7.1 7.8 7.1  Albumin 3.5 - 5.2 g/dL 4.0 3.9 4.2  AST 0 - 37 U/L _0 ALT 0 - 53 U/L 13  19 23  Alk Phosphatase 39 - 117 U/L 135(H) 88 104  Total Bilirubin 0.2 - 1.2 mg/dL 1.2 2.1(H) 1.0  Bilirubin, Direct 0.0 - 0.3 mg/dL - - -    Lab Results  Component Value Date/Time   TSH 1.895 07/07/2016 06:31 PM   TSH 1.88 05/05/2013 12:14 PM   TSH 2.290 **Test methodology is 3rd generation TSH** 06/23/2009 07:55 AM    CBC Latest Ref Rng & Units 12/27/2019 11/26/2018  01/05/2018  WBC 4.0 - 10.5 K/uL 7.4 7.7 7.2  Hemoglobin 13.0 - 17.0 g/dL 15.5 17.4(H) 15.9  Hematocrit 39.0 - 52.0 % 47.6 53.7(H) 47.4  Platelets 150.0 - 400.0 K/uL 221.0 165 194.0    Lab Results  Component Value Date/Time   VD25OH 10.8 (L) 11/03/2016 02:53 PM    Clinical ASCVD: Yes  The ASCVD Risk score Mikey Bussing DC Jr., et al., 2013) failed to calculate for the following reasons:   The patient has a prior MI or stroke diagnosis     Social History   Tobacco Use  Smoking Status Every Day   Packs/day: 2.00   Years: 45.00   Pack years: 90.00   Types: Cigarettes   Start date: 1976  Smokeless Tobacco Never  Tobacco Comments   1 pack daily-03/10/21   BP Readings from Last 3 Encounters:  03/10/21 124/72  01/28/21 102/60  01/28/21 138/70   Pulse Readings from Last 3 Encounters:  03/10/21 (!) 56  01/28/21 62  01/28/21 68   Wt Readings from Last 3 Encounters:  03/10/21 217 lb 3.2 oz (98.5 kg)  01/28/21 218 lb (98.9 kg)  01/28/21 218 lb (98.9 kg)    Assessment: Review of patient past medical history, allergies, medications, health status, including review of consultants reports, laboratory and other test data, was performed as part of comprehensive evaluation and provision of chronic care management services.   SDOH:  (Social Determinants of Health) assessments and interventions performed:  SDOH Interventions    Flowsheet Row Most Recent Value  SDOH Interventions   Financial Strain Interventions Other (Comment)  [most of patient's medications are low copay or no copay if uses [employer] order. Assisting patinet and his sister in transitioning over all prescriptions to [facility] mail order.]  Transportation Interventions [employer], Other (Comment)  [patient is working with CCM team on transportation needs]       [place]  No Known Allergies  Medications Reviewed Today     Reviewed by Cherre Robins, PharmD (Pharmacist) on 03/21/21 at 3  Med  List Status: <None>   Medication Order Taking? Sig Documenting Provider Last Dose Status Informant  albuterol (VENTOLIN HFA) 108 (90 Base) MCG/ACT inhaler [id number] Yes Inhale 2 puffs into the lungs every 6 (six) hours as needed for wheezing or shortness of breath. Colon Branch, MD Taking Active   aspirin EC 81 MG EC tablet [id number] No Take 1 tablet (81 mg total) by mouth daily with breakfast.  Patient not taking: Reported on 03/18/2021   Roxan Hockey, MD Not Taking Active   atorvastatin (LIPITOR) 80 MG tablet [id number] Yes Take 1 tablet (80 mg total) by mouth daily at 6 PM. Burnell Blanks, MD Taking Active   budesonide-formoterol [facility]) 160-4.5 MCG/ACT inhaler [id number] Yes Inhale 2 puffs into the lungs 2 (two) times daily. Colon Branch, MD Taking Active   clopidogrel (PLAVIX) 75 MG tablet [id number] Yes TAKE 1 TABLET (75 MG TOTAL) BY MOUTH DAILY. Burnell Blanks, MD Taking Active   fenofibrate [facility])  145 MG tablet [id number] Yes Take 1 tablet (145 mg total) by mouth daily. Colon Branch, MD Taking Active   folic acid (FOLVITE) 1 MG tablet [id number] Yes Take 1 tablet (1 mg total) by mouth daily. Roxan Hockey, MD Taking Active   furosemide (LASIX) 20 MG tablet [id number] Yes Take 3 tablets (60 mg total) by mouth daily. Burnell Blanks, MD Taking Active   isosorbide mononitrate (IMDUR) 60 MG 24 hr tablet [id number] Yes Take 1.5 tablets (90 mg total) by mouth daily. Burnell Blanks, MD Taking Active   metoprolol tartrate (LOPRESSOR) 25 MG tablet [id number] Yes Take 1 tablet (25 mg total) by mouth 2 (two) times daily. Burnell Blanks, MD Taking Active   nitroGLYCERIN (NITROSTAT) 0.4 MG SL tablet [id number] Yes Place 1 tablet (0.4 mg total) under the tongue every 5 (five) minutes x 3 doses as needed for chest pain. Colon Branch, MD Taking Active   pantoprazole (PROTONIX) 40 MG tablet [id number] Yes TAKE 1 TABLET TWICE DAILY Burnell Blanks, MD Taking  Active   spironolactone (ALDACTONE) 25 MG tablet [id number] Yes Take 0.5 tablets (12.5 mg total) by mouth daily. Burnell Blanks, MD Taking Active   tamsulosin [facility]) 0.4 MG CAPS capsule [id number] Yes Take 0.4 mg by mouth at bedtime. [provider] Taking Active   thiamine 100 MG tablet [id number] Yes Take 1 tablet (100 mg total) by mouth daily. Roxan Hockey, MD Taking Active   traZODone (DESYREL) 50 MG tablet [id number] Yes Take 1 tablet (50 mg total) by mouth at bedtime. Colon Branch, MD Taking Active   valsartan (DIOVAN) 80 MG tablet [id number] Yes Take 1 tablet (80 mg total) by mouth daily. Burnell Blanks, MD Taking Active             Patient Active Problem List   Diagnosis Date Noted   (HFpEF) heart failure with preserved ejection fraction ([place])    DOE (dyspnea on exertion) 11/07/2020   Diabetes ([place]) 03/18/2020   Prolapsed lumbar disc 01/25/2020   Closed fracture of lateral malleolus 01/25/2020   Hiatal hernia    Hepatic steatosis    Hemianopia, homonymous, right    ETOH abuse    Esophagitis    Chronic diastolic CHF (congestive heart failure) (HCC)    Chronic bronchitis ([place])    CAD (coronary artery disease)    PAD (peripheral artery disease) ([place]) 09/23/2016   Morbid obesity ([place])    Chronic pain syndrome 10/23/2015   GERD (gastroesophageal reflux disease) 08/07/2015   Pancreatitis, acute    PCP NOTES >>>>> 06/01/2015   Annual physical exam 05/31/2015   Acute ischemic stroke ([place]) 02/20/2015   Hx of cocaine abuse ([place]) 02/20/2015   BPH (benign prostatic hyperplasia) 02/20/2015   OSA (obstructive sleep apnea) ?? 05/05/2013   Noncompliance 05/05/2013   Anxiety and depression 05/05/2013   Erectile dysfunction 03/17/2012   Emphysema (subcutaneous) (surgical) resulting from a procedure 10/08/2008   Iron deficiency anemia 08/10/2008   ABNORMALITY OF GAIT 08/06/2008   TOBACCO ABUSE 07/27/2008   Dyslipidemia 07/26/2008   Essential hypertension  07/26/2008   ST elevation myocardial infarction involving left circumflex coronary artery ([place]) 07/26/2008    Immunization History  Administered Date(s) Administered   PFIZER(Purple Top)SARS-COV-2 Vaccination 11/17/2019, 12/11/2019, 07/18/2020   Pneumococcal Polysaccharide-23 11/03/2016   Tdap 11/03/2016    Conditions to be addressed/monitored: CHF, CAD, HTN, HLD, Hypertriglyceridemia, COPD, DMII, and BPH; OSA; GERD; fatty liver; h/o strokePAD; sleep disturbance; anxiety/ depression  Care Plan : Pharmacy  Care Plan  Updates made by Cherre Robins, PHARMD since 03/23/2021 12:00 AM     Problem: HTN; CHF: CAD; GERD with esophagitis; depression; BPH; COPD; bronchitis; stroke; elevated TG and hyperlipidemia; type 2 DM with h/o pancreatitis   Priority: High  Onset Date: 03/18/2021     Long-Range Goal: Management of chronic conditions and medications   Start Date: 03/18/2021  Priority: High  Note:   Current Barriers:  Unable to independently monitor therapeutic efficacy Unable to achieve control of Hyperlipidemia  Unable to maintain control of HTN, CHF Does not adhere to prescribed medication regimen Does not contact provider office for questions/concerns  Pharmacist Clinical Goal(s):  Over the next 90 days, patient will verbalize ability to afford treatment regimen achieve adherence to monitoring guidelines and medication adherence to achieve therapeutic efficacy achieve control of Hyperlipidemia as evidenced by LDL <70 maintain control of CHF, HTN and type 2 DM as evidenced by attaining goals listed below  adhere to prescribed medication regimen as evidenced by refill history contact provider office for questions/concerns as evidenced notation of same in electronic health record through collaboration with PharmD and provider.   Interventions: 1:1 collaboration with Colon Branch, MD regarding development and update of comprehensive plan of care as evidenced by provider attestation and  co-signature Inter-disciplinary care team collaboration (see longitudinal plan of care) Comprehensive medication review performed; medication list updated in electronic medical record  Diabetes: Uncontrolled; Goal A1c < 6.5% Last A1c was 7.0% (07/10/2020) Current treatment:  None Tried metformin in past but stopped due to GI side effects (tried 517m/day and 2581mday) History of pancreatitis (possibly related to alcohol use)   Current glucose readings: does not currently check BG at home Denies hypoglycemic/hyperglycemic symptoms Current meal patterns: over the last 2 to 3 weeks has been limiting bread and sweets Current exercise: none currently Interventions:   Counseled on A1c and BG goals ,  Recommend recheck A1c with next labs. If >6.5% consider starting SGLT2)  Hypertension / CHF: Improving; goal: decreased symptoms of CHF, prevent exacerbations and BP <140/90.  Current treatment: Furosemide 2087m take 3 tablets = 20m36mily in the morning Metoprolol tartrate 25mg22mce a day Spironolactone 25mg 67mke 0.5 tablet = 12.5mg ea57mmorning Valsartan 80mg da34mCurrent BP home readings: 120-145 / 64-70's EF = 55-60% (0603/2027026/37852 systolic dysfunction NYHA 2b Past medications tried: lisinopril 10mg - c6me to valsartan by pulmonologist.  Denies hypotensive/hypertensive symptoms Interventions:  Coordinated with [employer] mail order to get 90 day supply for furosemide.  [facility] sp[facility]. Patient states he only has about 10 tablets left but has 90 DS (#45 tablets) filled 06/28/202. Patient is not sure but he might have taken a full tablet a few days. Education provided regarding dose. [facility] Reviewed BP goals Discussed signs and symptoms of CHF exacerbation - weight gain, SOB, abdominal fullness, swelling in  legs or abdomen, Fatigue and weakness, changes in ability to perform usual activities, persistent cough or wheezing with white or pink blood-tinged mucus, nausea and lack of appetite Recommended he weigh daily - report weight gain of more than 3 lbs in 24 hours or 5 lbs in 1 week.  Hyperlipidemia / CAD / history of stroke / PAD: Uncontrolled; LDL goal <70; Tg goal <150 Last LDL was 116 and Tg was 97 Current treatment: Atorvastatin 80mg dail46mnofibrate  134m daily Aspirin 835mdaily with breakfast Clopidogrel 7534maily  Isosorbide ER 35m32mtake 1.5 tablets = 90mg75mly Nitroglycerin 0.4mg -47mace 1 tablet under the tongue and allow to dissolve as needed for chest pain Medications previously tried: Repatha - stopped due to cost; Brilenta  - stopped due to shortness of breath Patient had 2 pens of Repatha in his refrigerator but expiration date was 12/2020  Interventions:  Education provided about LDL goal Discussed benefits of getting LDL to goal and taking atorvastatin daily for secondary CHD prevention  Recommend recheck lipid panel with next labs; If LDL > 70, patient has agreed to retry Repatha (pharmacy team will help with applying for assistance for Repatha).   Chronic Obstructive Pulmonary Disease / OSA: Uncontrolled;  Current treatment: CPAP Symbicort (budesonide/formoterol) 160/4.5 - inhale 2 puffs into lungs twice a day Managed by pulmonologist - Dr Wert PMelvyn Novasous medications tried: Spiriva - not effective Not using CPAP. Even though setting were recently lowered to lowest setting, patient still feels that it is too high.  Interventions:  Coordinated with DME for trial of different mask to help him tolerate CPAP better Continue to follow up with pulmonologist  Continue current medications Reminded patient to rinse mouth after using Symbicort.    Medication Management:  Goal: take medications as prescribed; consult pharmacy if any questions or issues with medication  access Current Pharmacy: locally [facility]. [facility] so mail order is a great option to improve access Patient also has homonymous hemianopsia that resulted from one of his strokes. Sometimes has difficulty reading labels on his prescription bottles.  His sister, ChristAlyse Lowlping patient with medications as she is able (she [place] live in [place] is not able to see him frequently)  Interventions:  Discussed using either stickers of color pill containers to help patient identify morning, afternoon, evening and night medications Coordinated refills with [employer] for 90 DS for furosemide and tamsulosin.  Reviewed each medication with patient and discussed how to take and reason for taking.    Patient Goals/Self-Care Activities Over the next 90 days, patient will:  take medications as prescribed, focus on medication adherence by utilizing mail order pharmacy and contacting clinical pharmacist about any medications questions or concerns, check blood pressure 2 to 3 times per week, document, and provide at future appointments, weigh daily, and contact provider if weight gain of more than 3 lbs in 24 hours or 5 lbs in 1 week, and collaborate with provider on medication access solutions  Follow Up Plan: Telephone follow up appointment with care management team member scheduled for:  2 to 3 weeks            Medication Assistance:  none at this time but might need assistance if additional lipid lowering therapy needed in future.   [facility]  [facility] [address] [address]: [phone] [place] [id number]:  [phone] pill box? No - patient has declined to use pill container but he is starting to use stickers on bottles to help identify time of day to take each medication  Follow Up:  Patient agrees to Care Plan and Follow-up.  Plan: Telephone follow up appointment with care management team member scheduled for:  2 to 3 weeks  Cherre Robins, PharmD Clinical Pharmacist [facility] [place] [facility]

## 2021-03-25 ENCOUNTER — Telehealth: Payer: Medicare HMO

## 2021-03-25 ENCOUNTER — Encounter: Payer: Self-pay | Admitting: *Deleted

## 2021-03-25 DIAGNOSIS — Z87891 Personal history of nicotine dependence: Secondary | ICD-10-CM

## 2021-03-25 DIAGNOSIS — F17210 Nicotine dependence, cigarettes, uncomplicated: Secondary | ICD-10-CM

## 2021-03-27 DIAGNOSIS — J9611 Chronic respiratory failure with hypoxia: Secondary | ICD-10-CM | POA: Diagnosis not present

## 2021-03-27 DIAGNOSIS — J449 Chronic obstructive pulmonary disease, unspecified: Secondary | ICD-10-CM | POA: Diagnosis not present

## 2021-03-28 ENCOUNTER — Telehealth: Payer: Self-pay | Admitting: Internal Medicine

## 2021-03-28 NOTE — Telephone Encounter (Signed)
Patient's sister called stating his brother is completely out of fenofibrate (TRICOR) 145 MG tablet and [employer] won't be able to get his medication to him until possibly Tuesday. Per patient's sister, he would like for a [facility] so he can pick it up and be covered for the weekend. Please advice.   [facility] [place] [place] [address] Phone: [phone]

## 2021-03-31 ENCOUNTER — Encounter: Payer: Self-pay | Admitting: Internal Medicine

## 2021-03-31 ENCOUNTER — Ambulatory Visit: Payer: Medicare HMO | Admitting: Internal Medicine

## 2021-03-31 ENCOUNTER — Ambulatory Visit: Payer: Medicare HMO

## 2021-03-31 ENCOUNTER — Ambulatory Visit: Payer: Medicare HMO | Admitting: Pharmacist

## 2021-03-31 ENCOUNTER — Other Ambulatory Visit: Payer: Self-pay

## 2021-03-31 DIAGNOSIS — R0600 Dyspnea, unspecified: Secondary | ICD-10-CM

## 2021-03-31 DIAGNOSIS — Z91199 Patient's noncompliance with other medical treatment and regimen due to unspecified reason: Secondary | ICD-10-CM

## 2021-03-31 DIAGNOSIS — Z9119 Patient's noncompliance with other medical treatment and regimen: Secondary | ICD-10-CM

## 2021-03-31 DIAGNOSIS — F17210 Nicotine dependence, cigarettes, uncomplicated: Secondary | ICD-10-CM

## 2021-03-31 DIAGNOSIS — I10 Essential (primary) hypertension: Secondary | ICD-10-CM

## 2021-03-31 DIAGNOSIS — E785 Hyperlipidemia, unspecified: Secondary | ICD-10-CM

## 2021-03-31 DIAGNOSIS — R0609 Other forms of dyspnea: Secondary | ICD-10-CM

## 2021-03-31 DIAGNOSIS — J449 Chronic obstructive pulmonary disease, unspecified: Secondary | ICD-10-CM | POA: Diagnosis not present

## 2021-03-31 DIAGNOSIS — G4733 Obstructive sleep apnea (adult) (pediatric): Secondary | ICD-10-CM

## 2021-03-31 DIAGNOSIS — I25118 Atherosclerotic heart disease of native coronary artery with other forms of angina pectoris: Secondary | ICD-10-CM

## 2021-03-31 DIAGNOSIS — F17200 Nicotine dependence, unspecified, uncomplicated: Secondary | ICD-10-CM

## 2021-03-31 LAB — PULMONARY FUNCTION TEST
FEF 25-75 Post: 1.49 L/sec
FEF 25-75 Pre: 1.01 L/sec
FEF2575-%Change-Post: 48 %
FEF2575-%Pred-Post: 49 %
FEF2575-%Pred-Pre: 33 %
FEV1-%Change-Post: 13 %
FEV1-%Pred-Post: 53 %
FEV1-%Pred-Pre: 47 %
FEV1-Post: 1.93 L
FEV1-Pre: 1.7 L
FEV1FVC-%Change-Post: 5 %
FEV1FVC-%Pred-Pre: 86 %
FEV6-%Change-Post: 7 %
FEV6-%Pred-Post: 60 %
FEV6-%Pred-Pre: 56 %
FEV6-Post: 2.76 L
FEV6-Pre: 2.57 L
FEV6FVC-%Change-Post: 0 %
FEV6FVC-%Pred-Post: 104 %
FEV6FVC-%Pred-Pre: 104 %
FVC-%Change-Post: 7 %
FVC-%Pred-Post: 58 %
FVC-%Pred-Pre: 54 %
FVC-Post: 2.77 L
FVC-Pre: 2.59 L
Post FEV1/FVC ratio: 70 %
Post FEV6/FVC ratio: 100 %
Pre FEV1/FVC ratio: 66 %
Pre FEV6/FVC Ratio: 100 %

## 2021-03-31 NOTE — Progress Notes (Signed)
[name]., male    DOB: November 03, 1960,   MRN: [id number]   Brief patient profile:  Seen by [name] 2014 :  OSA  > stopped cpap on his own   25 yowm quit smoking 10/31/20 referred to pulmonary clinic 11/07/2020 by Dr  [name] for doe/cough/ hoarseness x ??? (pt unable to say)  While on ACEi chronically    History of Present Illness  11/07/2020  Pulmonary/ 1st office eval/[name]  Voice and swallowing s/p ST eval 09/13/2020 mild risk asp Chief Complaint  Patient presents with   Pulmonary Consult    Referred by Dr [name]. He states he has been told that he had COPD.   Dyspnea:  hc parking but can do even large store if walks slowly   Cough: assoc with choking / strangling / also blacked out from coughing 6 weeks prior to [place] prior to symbicort  With neg eval by [name] 09/03/20   Sleep: on side bed is flat with pillows  SABA use: not sure helping 02 :  3lpm hs/ not using daytime  Rec Change protonix (pantoprazole)  to 40 mg Take 30- 60 min before your first and last meals of the day  GERD diet reviewed, bed blocks rec  Stop lisinopril and start diovan 80 mg one daily  Plan A = Automatic = Always=   Change symbicort 160 Take 1 puffs first thing in am and then another 2 puffs about 12 hours later.  Work on inhaler technique:  Plan B = Backup (to supplement plan A, not to replace it) Only use your albuterol inhaler as a rescue medication Please schedule a follow up office visit in 6 weeks, call sooner if needed with all medications /inhalers/ solutions in hand so we can verify exactly what you are taking. This includes all medications from all doctors and over the counters  - PFTs on return - don't take the symbicort that day       03/31/2021  f/u ov/Anuj Summons re: ? Acei case    maint on nothing   Chief Complaint  Patient presents with   Follow-up    PFT done today. He states his breathing is doing well and denies any new co's. He states he does not really use his symbicort and he does not have  any other inhaler.    Dyspnea:  walks around [employer] ok  Cough: gone off acei, no more choking or presyncope off acei  Sleeping: bed is flat/ 2pillows s am coough SABA use: none  02: none  Covid status:   x 3 vax / infected ? omicron   No obvious day to day or daytime variability or assoc excess/ purulent sputum or mucus plugs or hemoptysis or cp or chest tightness, subjective wheeze or overt sinus or hb symptoms.   Sleeping  without nocturnal  or early am exacerbation  of respiratory  c/o's or need for noct saba. Also denies any obvious fluctuation of symptoms with weather or environmental changes or other aggravating or alleviating factors except as outlined above   No unusual exposure hx or h/o childhood pna/ asthma or knowledge of premature birth.  Current Allergies, Complete Past Medical History, Past Surgical History, Family History, and Social History were reviewed in [employer] record.  ROS  The following are not active complaints unless bolded Hoarseness, sore throat, dysphagia, dental problems, itching, sneezing,  nasal congestion or discharge of excess mucus or purulent secretions, ear ache,   fever, chills, sweats,  unintended wt loss or wt gain, classically pleuritic or exertional cp,  orthopnea pnd or arm/hand swelling  or leg swelling, presyncope, palpitations, abdominal pain, anorexia, nausea, vomiting, diarrhea  or change in bowel habits or change in bladder habits, change in stools or change in urine, dysuria, hematuria,  rash, arthralgias, visual complaints, headache, numbness, weakness or ataxia or problems with walking or coordination,  change in mood or  memory.        Current Meds  Medication Sig   aspirin EC 81 MG EC tablet Take 1 tablet (81 mg total) by mouth daily with breakfast.   atorvastatin (LIPITOR) 80 MG tablet Take 1 tablet (80 mg total) by mouth daily at 6 PM.   clopidogrel (PLAVIX) 75 MG tablet TAKE 1 TABLET (75 MG TOTAL) BY MOUTH  DAILY.   fenofibrate (TRICOR) 145 MG tablet Take 1 tablet (145 mg total) by mouth daily.   folic acid (FOLVITE) 1 MG tablet Take 1 tablet (1 mg total) by mouth daily.   furosemide (LASIX) 20 MG tablet Take 3 tablets (60 mg total) by mouth daily.   isosorbide mononitrate (IMDUR) 60 MG 24 hr tablet Take 1.5 tablets (90 mg total) by mouth daily.   metoprolol tartrate (LOPRESSOR) 25 MG tablet Take 1 tablet (25 mg total) by mouth 2 (two) times daily.   nitroGLYCERIN (NITROSTAT) 0.4 MG SL tablet Place 1 tablet (0.4 mg total) under the tongue every 5 (five) minutes x 3 doses as needed for chest pain.   pantoprazole (PROTONIX) 40 MG tablet TAKE 1 TABLET TWICE DAILY   spironolactone (ALDACTONE) 25 MG tablet Take 0.5 tablets (12.5 mg total) by mouth daily.   tamsulosin (FLOMAX) 0.4 MG CAPS capsule Take 0.4 mg by mouth at bedtime.   thiamine 100 MG tablet Take 1 tablet (100 mg total) by mouth daily.   traZODone (DESYREL) 50 MG tablet Take 1 tablet (50 mg total) by mouth at bedtime.   valsartan (DIOVAN) 80 MG tablet Take 1 tablet (80 mg total) by mouth daily.       Past Medical History:  Diagnosis Date   Abnormality of gait    Anxiety    CAD (coronary artery disease)    a. 10/2008 Inferolateral STEMI: 3VD w/ occluded LCX (PTCA)-->CABG x 3 (LIMA->LAD, VG->OM, VG->PDA);  b. 06/2009 NSTEMI/PCI: VG->OM 100 (BMS);  c. 05/2013 native 3VD, 3/3 patent grafts. d. 06/2016: Inferior STEMI w/ occluded SVG-OM (DES placed). LIMA-LAD and SVG-PDA patent.   Chronic bronchitis (HCC)    Chronic diastolic CHF (congestive heart failure) ([place])    a. 02/2015 Echo: EF 50-55%, distal septal, apical, inferobasal HK, mild LVH, mild MR, mildly dil LA.   Cocaine use    Colon polyps    a. 09/2015 colonscopy: multiple sessile polyps, path negative for high grade dysplasia.   Depression    Emphysema (subcutaneous) (surgical) resulting from a procedure 10/2008.   Bleb resected at time of 10/2008 CABG. Marketed emphysema noted on  surgical report.   Esophagitis    a. 2017 EGD: esophagitis, duodenitis.   ETOH abuse    GERD (gastroesophageal reflux disease)    Hemianopia, homonymous, right    "can't see out the right side of either eye since stroke in 2016/pt description 09/23/2016   Hepatic steatosis    Hiatal hernia    History of nuclear stress test 08/2017   Nuclear stress test 1/19: EF 35, inf-lat and ant-lat, apical sept/apical scar; no ischemia; Intermediate Risk   Hyperlipidemia    Hypertension  Iron deficiency anemia 2010   Morbid obesity ([place])    Myocardial infarction [facility]) "several"   PAD (peripheral artery disease) ([place])    Pancreatitis 06/2015   Sleep apnea    "don't have a mask" (09/23/2016)   Stroke ([place]) 02/2015   a. 02/2015 Carotid U/S: 1-39% bilat ICA stenosis; "left me blind" (09/23/2016)   Tobacco abuse    still smokes   Tubular adenoma of colon        Objective:       Wt Readings from Last 3 Encounters:  03/10/21 217 lb 3.2 oz (98.5 kg)  01/28/21 218 lb (98.9 kg)  01/28/21 218 lb (98.9 kg)    Vital signs reviewed  03/31/2021  - Note at rest 02 sats  99% on RA   General appearance:    ambslt hoarse wm nad      HEENT : pt wearing mask not removed for exam due to covid -19 concerns.    NECK :  without JVD/Nodes/TM/ nl carotid upstrokes bilaterally   LUNGS: no acc muscle use,  Nl contour chest which is clear to A and P bilaterally without cough on insp or exp maneuvers   CV:  RRR  no s3 or murmur or increase in P2, and no edema   ABD:  soft and nontender with nl inspiratory excursion in the supine position. No bruits or organomegaly appreciated, bowel sounds nl  MS:  Nl gait/ ext warm without deformities, calf tenderness, cyanosis or clubbing No obvious joint restrictions   SKIN: warm and dry without lesions    NEURO:  alert, approp, nl sensorium with  no motor or cerebellar deficits apparent.         Assessment     Outpatient Encounter Medications as of 03/31/2021   Medication Sig   aspirin EC 81 MG EC tablet Take 1 tablet (81 mg total) by mouth daily with breakfast.   atorvastatin (LIPITOR) 80 MG tablet Take 1 tablet (80 mg total) by mouth daily at 6 PM.   clopidogrel (PLAVIX) 75 MG tablet TAKE 1 TABLET (75 MG TOTAL) BY MOUTH DAILY.   fenofibrate (TRICOR) 145 MG tablet Take 1 tablet (145 mg total) by mouth daily.   folic acid (FOLVITE) 1 MG tablet Take 1 tablet (1 mg total) by mouth daily.   furosemide (LASIX) 20 MG tablet Take 3 tablets (60 mg total) by mouth daily.   isosorbide mononitrate (IMDUR) 60 MG 24 hr tablet Take 1.5 tablets (90 mg total) by mouth daily.   metoprolol tartrate (LOPRESSOR) 25 MG tablet Take 1 tablet (25 mg total) by mouth 2 (two) times daily.   nitroGLYCERIN (NITROSTAT) 0.4 MG SL tablet Place 1 tablet (0.4 mg total) under the tongue every 5 (five) minutes x 3 doses as needed for chest pain.   pantoprazole (PROTONIX) 40 MG tablet TAKE 1 TABLET TWICE DAILY   spironolactone (ALDACTONE) 25 MG tablet Take 0.5 tablets (12.5 mg total) by mouth daily.   tamsulosin (FLOMAX) 0.4 MG CAPS capsule Take 0.4 mg by mouth at bedtime.   thiamine 100 MG tablet Take 1 tablet (100 mg total) by mouth daily.   traZODone (DESYREL) 50 MG tablet Take 1 tablet (50 mg total) by mouth at bedtime.   valsartan (DIOVAN) 80 MG tablet Take 1 tablet (80 mg total) by mouth daily.   budesonide-formoterol (SYMBICORT) 160-4.5 MCG/ACT inhaler Inhale 2 puffs into the lungs 2 (two) times daily. (Patient not taking: Reported on 03/31/2021)   [DISCONTINUED] albuterol (VENTOLIN HFA) 108 (90  Base) MCG/ACT inhaler Inhale 2 puffs into the lungs every 6 (six) hours as needed for wheezing or shortness of breath.   No facility-administered encounter medications on file as of 03/31/2021.

## 2021-03-31 NOTE — Chronic Care Management (AMB) (Signed)
Chronic Care Management Pharmacy Note  03/31/2021 Name:  [name]. MRN:  [id number] DOB:  09/12/1960   Subjective: [name]. is an 60 y.o. year old male who is a primary patient of [name], [name], MD.  The CCM team was consulted for assistance with disease management and care coordination needs.    Collaboration with mail order pharmacy and patient's sister  for  coordination of refills for fenofibrate and to make follow up appt with PCP  in response to provider referral for pharmacy case management and/or care coordination services.   Consent to Services:  The patient was given information about Chronic Care Management services, agreed to services, and gave verbal consent prior to initiation of services.  Please see initial visit note for detailed documentation.   Patient Care Team: Kenneth Branch, MD as PCP - General (Internal Medicine) Kenneth Blanks, MD as PCP - Cardiology (Cardiology) Kenneth Bathe, MD as Consulting Physician (Ophthalmology) Pyrtle, Lajuan Lines, MD as Consulting Physician (Gastroenterology) Kenneth Day, MD as Consulting Physician (Orthopedic Surgery) Kenneth Rued, RN as Case Manager [name], PharmD (Pharmacist) Kenneth Nunnery, MD as Consulting Physician (Otolaryngology)  Recent office visits: 10/22/2020 - PCP (Dr [name]) - F/U chronic conditions. Removed Repatha from med list as pt reported no longer taking due to cost. Patient also was not taking metformin - new Rx sent. Recommended restart trazodone for depression / insomnia. Referral for outpatient speach therapy.   Recent consult visits: 03/10/2021 - Pulm Kenneth Confer, NP) Phone Visit - Lung Cancer Screeing education session. CT scheduled.  01/28/2021 - Pulm (Parrett, NP) F/U cough OSA and SOB. Suspects underlying COPD - ordered PFTs. Recommended restart CPAP. Ordered DME to change CPAP to Auto set 10-20. 01/28/2021 Cardio ([name], [facility]) F/U CHFpEF. Stopped potassium  supplement; Started spironolacton 89m - take 0.5 tablet daily. Consider SGLT2 or Entresto in future.   Hospital visits: None in previous 6 months  Objective:  Lab Results  Component Value Date   CREATININE 1.02 02/19/2021   CREATININE 0.94 02/12/2021   CREATININE 0.99 01/28/2021    Lab Results  Component Value Date   HGBA1C 7.0 (H) 07/10/2020   Last diabetic Eye exam: No results found for: HMDIABEYEEXA  Last diabetic Foot exam: No results found for: HMDIABFOOTEX      Component Value Date/Time   CHOL 171 11/14/2019 1441   TRIG 97 11/14/2019 1441   HDL 37 (L) 11/14/2019 1441   CHOLHDL 4.6 11/14/2019 1441   CHOLHDL 4.3 11/27/2018 0441   VLDL 35 11/27/2018 0441   LDLCALC 116 (H) 11/14/2019 1441   LDLDIRECT 116.0 05/12/2013 1517    Hepatic Function Latest Ref Rng & Units 07/10/2020 11/26/2018 08/24/2017  Total Protein 6.0 - 8.3 g/dL 7.1 7.8 7.1  Albumin 3.5 - 5.2 g/dL 4.0 3.9 4.2  AST 0 - 37 U/L '12 22 23  ' ALT 0 - 53 U/L '13 19 23  ' Alk Phosphatase 39 - 117 U/L 135(H) 88 104  Total Bilirubin 0.2 - 1.2 mg/dL 1.2 2.1(H) 1.0  Bilirubin, Direct 0.0 - 0.3 mg/dL - - -    Lab Results  Component Value Date/Time   TSH 1.895 07/07/2016 06:31 PM   TSH 1.88 05/05/2013 12:14 PM   TSH 2.290 **Test methodology is 3rd generation TSH** 06/23/2009 07:55 AM    CBC Latest Ref Rng & Units 12/27/2019 11/26/2018 01/05/2018  WBC 4.0 - 10.5 K/uL 7.4 7.7 7.2  Hemoglobin 13.0 - 17.0 g/dL 15.5 17.4(H) 15.9  Hematocrit 39.0 - 52.0 % 47.6 53.7(H) 47.4  Platelets 150.0 - 400.0 K/uL 221.0 165 194.0    Lab Results  Component Value Date/Time   VD25OH 10.8 (L) 11/03/2016 02:53 PM    Clinical ASCVD: Yes  The ASCVD Risk score [name]., et al., 2013) failed to calculate for the following reasons:   The patient has a prior MI or stroke diagnosis     Social History   Tobacco Use  Smoking Status Every [name]   Packs/[name]: 2.00   Years: 45.00   Pack years: 90.00   Types: Cigarettes   Start date:  1976  Smokeless Tobacco Never  Tobacco Comments   1 pack daily-03/10/21   BP Readings from Last 3 Encounters:  03/10/21 124/72  01/28/21 102/60  01/28/21 138/70   Pulse Readings from Last 3 Encounters:  03/10/21 (!) 56  01/28/21 62  01/28/21 68   Wt Readings from Last 3 Encounters:  03/10/21 217 lb 3.2 oz (98.5 kg)  01/28/21 218 lb (98.9 kg)  01/28/21 218 lb (98.9 kg)    Assessment: Review of patient past medical history, allergies, medications, health status, including review of consultants reports, laboratory and other test data, was performed as part of comprehensive evaluation and provision of chronic care management services.   SDOH:  (Social Determinants of Health) assessments and interventions performed:     CCM Care Plan  No Known Allergies  Medications Reviewed Today     Reviewed by [name], PharmD (Pharmacist) on 03/31/21 at 60  Med List Status: <None>   Medication Order Taking? Sig Documenting Provider Last Dose Status Informant  albuterol (VENTOLIN HFA) 108 (90 Base) MCG/ACT inhaler [id number] Yes Inhale 2 puffs into the lungs every 6 (six) hours as needed for wheezing or shortness of breath. Kenneth Branch, MD Taking Active   aspirin EC 81 MG EC tablet [id number] No Take 1 tablet (81 mg total) by mouth daily with breakfast.  Patient not taking: Reported on 03/31/2021   Kenneth Hockey, MD Not Taking Active   atorvastatin (LIPITOR) 80 MG tablet [id number] Yes Take 1 tablet (80 mg total) by mouth daily at 6 PM. Kenneth Blanks, MD Taking Active   budesonide-formoterol [facility]) 160-4.5 MCG/ACT inhaler [id number] Yes Inhale 2 puffs into the lungs 2 (two) times daily. Kenneth Branch, MD Taking Active   clopidogrel (PLAVIX) 75 MG tablet [id number] Yes TAKE 1 TABLET (75 MG TOTAL) BY MOUTH DAILY. Kenneth Blanks, MD Taking Active   fenofibrate (TRICOR) 145 MG tablet [id number] No Take 1 tablet (145 mg total) by mouth daily.  Patient not taking: Reported  on 03/31/2021   Kenneth Branch, MD Not Taking Active   folic acid (FOLVITE) 1 MG tablet [id number] Yes Take 1 tablet (1 mg total) by mouth daily. Kenneth Hockey, MD Taking Active   furosemide (LASIX) 20 MG tablet [id number] Yes Take 3 tablets (60 mg total) by mouth daily. Kenneth Blanks, MD Taking Active   isosorbide mononitrate (IMDUR) 60 MG 24 hr tablet [id number] Yes Take 1.5 tablets (90 mg total) by mouth daily. Kenneth Blanks, MD Taking Active   metoprolol tartrate (LOPRESSOR) 25 MG tablet [id number] Yes Take 1 tablet (25 mg total) by mouth 2 (two) times daily. Kenneth Blanks, MD Taking Active   nitroGLYCERIN (NITROSTAT) 0.4 MG SL tablet [id number] Yes Place 1 tablet (0.4 mg total) under the tongue every 5 (five) minutes x 3 doses as needed for chest pain. Kenneth Branch, MD Taking Active  pantoprazole (PROTONIX) 40 MG tablet [id number] Yes TAKE 1 TABLET TWICE DAILY Kenneth Blanks, MD Taking Active   spironolactone (ALDACTONE) 25 MG tablet [id number] Yes Take 0.5 tablets (12.5 mg total) by mouth daily. Kenneth Blanks, MD Taking Active   tamsulosin [facility]) 0.4 MG CAPS capsule [id number] Yes Take 0.4 mg by mouth at bedtime. [provider] Taking Active   thiamine 100 MG tablet [id number] Yes Take 1 tablet (100 mg total) by mouth daily. Kenneth Hockey, MD Taking Active   traZODone (DESYREL) 50 MG tablet [id number] Yes Take 1 tablet (50 mg total) by mouth at bedtime. Kenneth Branch, MD Taking Active   valsartan (DIOVAN) 80 MG tablet [id number] Yes Take 1 tablet (80 mg total) by mouth daily. Kenneth Blanks, MD Taking Active             Patient Active Problem List   Diagnosis Date Noted   (HFpEF) heart failure with preserved ejection fraction ([place])    DOE (dyspnea on exertion) 11/07/2020   Diabetes ([place]) 03/18/2020   Prolapsed lumbar disc 01/25/2020   Closed fracture of lateral malleolus 01/25/2020   Hiatal hernia    Hepatic steatosis     Hemianopia, homonymous, right    ETOH abuse    Esophagitis    Chronic diastolic CHF (congestive heart failure) (HCC)    Chronic bronchitis ([place])    CAD (coronary artery disease)    PAD (peripheral artery disease) ([place]) 09/23/2016   Morbid obesity ([place])    Chronic pain syndrome 10/23/2015   GERD (gastroesophageal reflux disease) 08/07/2015   Pancreatitis, acute    PCP NOTES >>>>> 06/01/2015   Annual physical exam 05/31/2015   Acute ischemic stroke ([place]) 02/20/2015   Hx of cocaine abuse ([place]) 02/20/2015   BPH (benign prostatic hyperplasia) 02/20/2015   OSA (obstructive sleep apnea) ?? 05/05/2013   Noncompliance 05/05/2013   Anxiety and depression 05/05/2013   Erectile dysfunction 03/17/2012   Emphysema (subcutaneous) (surgical) resulting from a procedure 10/08/2008   Iron deficiency anemia 08/10/2008   ABNORMALITY OF GAIT 08/06/2008   TOBACCO ABUSE 07/27/2008   Dyslipidemia 07/26/2008   Essential hypertension 07/26/2008   ST elevation myocardial infarction involving left circumflex coronary artery ([place]) 07/26/2008    Immunization History  Administered Date(s) Administered   PFIZER(Purple Top)SARS-COV-2 Vaccination 11/17/2019, 12/11/2019, 07/18/2020   Pneumococcal Polysaccharide-23 11/03/2016   Tdap 11/03/2016    Conditions to be addressed/monitored: CHF, CAD, HTN, HLD, Hypertriglyceridemia, COPD, DMII, and BPH; OSA; GERD; fatty liver; h/o strokePAD; sleep disturbance; anxiety/ depression  Care Plan : Pharmacy Care Plan  Updates made by [name], PHARMD since 03/31/2021 12:00 AM     Problem: HTN; CHF: CAD; GERD with esophagitis; depression; BPH; COPD; bronchitis; stroke; elevated TG and hyperlipidemia; type 2 DM with h/o pancreatitis   Priority: High  Onset Date: 03/18/2021     Long-Range Goal: Management of chronic conditions and medications   Start Date: 03/18/2021  Priority: High  Note:   Current Barriers:  Unable to independently monitor therapeutic  efficacy Unable to achieve control of Hyperlipidemia  Unable to maintain control of HTN, CHF Does not adhere to prescribed medication regimen Does not contact provider office for questions/concerns  Pharmacist Clinical Goal(s):  Over the next 90 days, patient will verbalize ability to afford treatment regimen achieve adherence to monitoring guidelines and medication adherence to achieve therapeutic efficacy achieve control of Hyperlipidemia as evidenced by LDL <70 maintain control of CHF, HTN and type 2 DM as evidenced by attaining goals listed  below  adhere to prescribed medication regimen as evidenced by refill history contact provider office for questions/concerns as evidenced notation of same in electronic health record through collaboration with PharmD and provider.   Interventions: 1:1 collaboration with Kenneth Branch, MD regarding development and update of comprehensive plan of care as evidenced by provider attestation and co-signature Inter-disciplinary care team collaboration (see longitudinal plan of care) Comprehensive medication review performed; medication list updated in electronic medical record  Diabetes: Uncontrolled; Goal A1c < 6.5% Last A1c was 7.0% (07/10/2020) Current treatment:  None Tried metformin in past but stopped due to GI side effects (tried 583m/[name] and 259mday) History of pancreatitis (possibly related to alcohol use)   Current glucose readings: does not currently check BG at home Denies hypoglycemic/hyperglycemic symptoms Current meal patterns: over the last 2 to 3 weeks has been limiting bread and sweets Current exercise: none currently Interventions:   Counseled on A1c and BG goals ,  Recommend recheck A1c with next labs. If >6.5% consider starting SGLT2)  Hypertension / CHF: Improving; goal: decreased symptoms of CHF, prevent exacerbations and BP <140/90.  Current treatment: Furosemide 2057m take 3 tablets = 59m73mily in the  morning Metoprolol tartrate 25mg64mce a [name] Spironolactone 25mg 49mke 0.5 tablet = 12.5mg ea48mmorning Valsartan 80mg da8mCurrent BP home readings: 120-145 / 64-70's EF = 55-60% (0603/2021610/96042 systolic dysfunction NYHA 2b Past medications tried: lisinopril 10mg - c79me to valsartan by pulmonologist.  Denies hypotensive/hypertensive symptoms Interventions:  Coordinated with [employer] mail order to get 90 [name] supply for furosemide.  [facility] [employer] [facility]. Patient states he only has about 10 tablets left but has 90 DS (#45 tablets) filled 06/28/202. Patient is not sure but he might have taken a full tablet a few days. Education provided regarding dose. [facility]) Reviewed BP goals Discussed signs and symptoms of CHF exacerbation - weight gain, SOB, abdominal fullness, swelling in legs or abdomen, Fatigue and weakness, changes in ability to perform usual activities, persistent cough or wheezing with white or pink blood-tinged mucus, nausea and lack of appetite Recommended he weigh daily - report weight gain of more than 3 lbs in 24 hours or 5 lbs in 1 week.  Hyperlipidemia / CAD / history of stroke / PAD: Uncontrolled; LDL goal <70; Tg goal <150 Last LDL was 116 and Tg was 97 Current treatment: Atorvastatin 80mg dail5mnofibrate 145mg daily9mirin 81mg daily 39m breakfast (not taking) Clopidogrel 75mg daily I42mrbide ER 59mg 0 take 170mablets = 90mg daily Nit17mycerin 0.4mg - place 1 t9met under the tongue and allow to dissolve as needed for chest pain Medications previously tried: Repatha - stopped due to cost; Brilenta  - stopped due to shortness of breath Patient had 2 pens of Repatha in his refrigerator but expiration date was 12/2020  Patient stopped aspirin due to having nosebleeds. His cardiologist  recommended at his lats visit that he restart aspirin and if nose bleeds resumed to see Dr Newman - ENT forLucia Gaskinsation. Patient has not restarted aspirin and continues to have nosebleeds. At last visit assisted patient in making appointment with Dr Newman - ENT forLucia Gaskinsation of nosebleed. Appointment is 04/28/2021 at 3:30pm Ran out of fenofibrate 5 days ago. Called [employer] [place] and they will send today but needed confirmation from patient because there is a copay. Patient  should received in 3 to 4 days.  Interventions:  Education provided about LDL goal Discussed benefits of getting LDL to goal and taking atorvastatin daily for secondary CHD prevention  Recommend recheck lipid panel with next labs; If LDL > 70, patient has agreed to retry Repatha (pharmacy team will help with applying for assistance for Repatha).  Recommended restart aspirin 39m daily Assisted in getting fenofibrate sent from mail order pharmacy Follow up appointment made for patient to see PCP in September.   Chronic Obstructive Pulmonary Disease / OSA: Uncontrolled;  Current treatment: CPAP Symbicort (budesonide/formoterol) 160/4.5 - inhale 2 puffs into lungs twice a [name] Managed by pulmonologist - Dr WMelvyn Novas has an appointment later today Previous medications tried: Spiriva - not effective Not using CPAP. Even though setting were recently lowered to lowest setting, patient still feels that it is too high.  Interventions:  Coordinated with DME for trial of different mask to help him tolerate CPAP better Continue to follow up with pulmonologist  Continue current medications Reminded patient to rinse mouth after using Symbicort.    Medication Management:  Goal: take medications as prescribed; consult pharmacy if any questions or issues with medication access Current Pharmacy: locally uses [facility]. Recently [place] so mail [place] is  a great option to improve access Patient also has homonymous hemianopsia that resulted from one of his strokes. Sometimes has difficulty reading labels on his prescription bottles.  His sister, CAlyse Lowis helping patient with medications as she is able ([place] frequently)  Interventions: (at previous visit)  Discussed using either stickers of color pill containers to help patient identify morning, afternoon, evening and night medications Coordinated refills with [employer] for 90 DS for furosemide and tamsulosin.  Reviewed each medication with patient and discussed how to take and reason for taking.    Patient Goals/Self-Care Activities Over the next 90 days, patient will:  take medications as prescribed, focus on medication adherence by utilizing mail order pharmacy and contacting clinical pharmacist about any medications questions or concerns, check blood pressure 2 to 3 times per week, document, and provide at future appointments, weigh daily, and contact provider if weight gain of more than 3 lbs in 24 hours or 5 lbs in 1 week, and collaborate with provider on medication access solutions  Follow Up Plan: Telephone follow up appointment with care management team member scheduled for:  already has f/u phone appt with clinical pharmacist later this week.             Medication Assistance:  none at this time but might need assistance if additional lipid lowering therapy needed in future.   Patient's [facility]:  [facility] [place] [address] [id number]: [phone]: [phone] [facility] [place] [address] [place]Phone: [phone]: [phone] Uses pill box? No - patient has declined to use pill container but he is  starting to use stickers on bottles to help identify time of [name] to take each medication  Follow Up:  Patient agrees to Care Plan and Follow-up.  Plan: already has phone appointment with clinical pharmacist for later this week.   TCherre [name] PharmD Clinical Pharmacist LFife HeightsPrimary  Care [facility]

## 2021-03-31 NOTE — Assessment & Plan Note (Addendum)
D/c acei  11/07/2020  Due to hoarseness, cough to point of syncope     Resolved as of 03/31/2021   Although even in retrospect it may not be clear the ACEi contributed to the pt's symptoms,  Pt improved off them and adding them back at this point or in the future would risk confusion in interpretation of non-specific respiratory symptoms to which this patient is prone  ie  Better not to muddy the waters here.          Each maintenance medication was reviewed in detail including emphasizing most importantly the difference between maintenance and prns and under what circumstances the prns are to be triggered using an action plan format where appropriate.  Total time for H and P, chart review, counseling, reviewing hfa device(s) and generating customized AVS unique to this summary office visit / same day charting = 24 min

## 2021-03-31 NOTE — Telephone Encounter (Signed)
Pt is overdue for appt- he no showed his last visit- unable to refill until he is scheduled.

## 2021-03-31 NOTE — Assessment & Plan Note (Addendum)
Echo 11/27/2018 The left ventricle has normal systolic function with an ejection  fraction of 60-65%. The cavity size was normal. There is mildly increased  left ventricular wall thickness. Left ventricular diastolic parameters  were normal.  2. Inferobasal hypokinesis.  3. The right ventricle has normal systolic function. The cavity was  normal. There is no increase in right ventricular wall thickness.  4. Left atrial size was moderately dilated.  5. Mild thickening of the mitral valve leaflet. Mild calcification of the  mitral valve leaflet.  6. The tricuspid valve is grossly normal.  7. The aortic valve was not well visualized. Moderate thickening of the  aortic valve. Sclerosis without any evidence of stenosis of the aortic  valve. No stenosis of the aortic valve.  - Try off acei 11/07/2020  See hbp > much better 03/31/2021  - PFT's  03/31/2021  FEV1 1.93 (53 % ) ratio 0.70  p 13 % improvement from saba p 0 prior to study with   FV curve no signficant curvature  Mostly likely due to diastolic dysfunction and obesity/ deconditioning at this point, pulm f/u is prn

## 2021-03-31 NOTE — Patient Instructions (Signed)
Visit Information  PATIENT GOALS:  Goals Addressed             This Visit's Progress    Pharmacy Chronic Care Management Plan       Current Barriers:  Unable to independently monitor therapeutic efficacy Unable to achieve control of Hyperlipidemia / high cholesterol Unable to maintain control of blood pressure and heart disease Does not adhere to prescribed medication regimen Does not contact provider office for questions/concerns  Pharmacist Clinical Goal(s):  Over the next 90 days, patient will verbalize ability to afford treatment regimen achieve adherence to monitoring guidelines and medication adherence to achieve therapeutic efficacy achieve control of Hyperlipidemia as evidenced by LDL <70 maintain control of Heart disease, blood pressure and type 2 diabetes as evidenced by attaining goals listed below  adhere to prescribed medication regimen as evidenced by refill history contact provider office for questions/concerns as evidenced notation of same in electronic health record through collaboration with PharmD and provider.   Interventions: 1:1 collaboration with Colon Branch, MD regarding development and update of comprehensive plan of care as evidenced by provider attestation and co-signature Inter-disciplinary care team collaboration (see longitudinal plan of care) Comprehensive medication review performed; medication list updated in electronic medical record  Diabetes: Uncontrolled; Goal A1c < 6.5% Last A1c was 7.0% (07/10/2020) Current treatment:  None Interventions:   Counseled on A1c goal Reviewed home blood goals  Fasting blood glucose goal (before meals) = 80 to 130 Blood glucose goal after a meal = less than 180  Recommend recheck A1c with next labs. If >6.5% consider starting medication therapy  Hypertension / Heart Disease:  Goal: decreased symptoms of heart disease, prevent exacerbations and BP <140/90.  Current treatment: Furosemide '20mg'$  - take 3  tablets = '60mg'$  daily in the morning Metoprolol tartrate '25mg'$  twice a day Spironolactone '25mg'$  - take 0.5 tablet = 12.'5mg'$  each morning Valsartan '80mg'$  daily Interventions:  Make sure you are only taking 0.5 tablet (one-half) of spironolactone daily. If you need a refill before insurance will allow you can ask [facility] to fill 15 tablets = 30 days of therapy off insurance. Cost should not be too high.  Reviewed blood pressure goal Continue to check blood pressure 2 to 3 times a week, record and bring to future appointments. Discussed signs and symptoms of worsening heart disease weight gain, shortness of breath, abdominal fullness, swelling in legs or abdomen, Fatigue and weakness, changes in ability to perform usual activities, persistent cough or wheezing with white or pink blood-tinged mucus, nausea and lack of appetite. Contact cardiologist or primary care physician if you experience any of these symptoms.  Recommend checking weigh every day. Report weight gain of more than 3 lbs in 24 hours or 5 lbs in 1 week.  Hyperlipidemia with elevated triglycerides / history of stroke: LDL goal <70; Triglycerides goal <150 Last LDL was 116 and Triglycerides were 97 Current treatment: Atorvastatin '80mg'$  daily Fenofibrate '145mg'$  daily Aspirin '81mg'$  daily with breakfast (has not taken recently Clopidogrel '75mg'$  daily Isosorbide ER '60mg'$  0 take 1.5 tablets = '90mg'$  daily Nitroglycerin 0.'4mg'$  - place 1 tablet under the tongue and allow to dissolve as needed for chest pain Interventions:  Education provided about LDL and triglyceride goal Discussed benefits of getting LDL to goal and taking atorvastatin daily for worsening of heart disease / stroke Recommend recheck lipid panel with next labs; If LDL > 70, consider restarting Repatha or similar medication (pharmacy team will help with applying for assistance with Repatha  cost).  Recommended restart aspirin '81mg'$  daily (since nosebleeds have no decreased  since stopping)  Coordinated refill for fenofibrate with [employer] mail order Appointment made for follow up with Dr [name] 05/07/2021   Chronic Obstructive Pulmonary Disease / sleep apnea: Current treatment: CPAP Symbicort (budesonide/formoterol) 160/4.5 - inhale 2 puffs into lungs twice a day Interventions:  Coordinated with DME for trial of different mask to help him tolerate CPAP better Continue to follow up with pulmonologist  Continue current medications Reminded patient to rinse mouth after using Symbicort.   Medication Management:  Goal: take medications as prescribed; consult clinical pharmacist if any questions or issues with medication access Current Pharmacy:  locally uses [facility].  [employer] Mail Order Interventions:  Discussed using either color stickers or color pill containers to help patient identify morning, afternoon, evening and night medications Reviewed each medication with patient and discussed how to take and reason for taking.   Patient Goals/Self-Care Activities Over the next 90 days, patient will:  Take medications as prescribed, focus on medication adherence by utilizing mail order pharmacy and contacting clinical pharmacist about any medications questions or concerns Check blood pressure 2 to 3 times per week, document, and provide at future appointments,  Weigh daily, and contact provider if weight gain of more than 3 lbs in 24 hours or 5 lbs in 1 week Collaborate with provider on medication access solutions  Follow Up Plan: Telephone follow up appointment with care management team member scheduled for clinical pharmacy later this week (was already on schedule)          The patient verbalized understanding of instructions, educational materials, and care plan provided today and declined offer to receive copy of patient instructions, educational materials, and care plan.   Telephone follow up appointment with care management team member  scheduled for: later this week with clinical pharmacist  Cherre Robins, PharmD Clinical Pharmacist [facility]

## 2021-03-31 NOTE — Assessment & Plan Note (Addendum)
4-5 min discussion re active cigarette smoking in addition to office E&M  Ask about tobacco use:   ongoing Advise quitting     I reviewed the Fletcher curve with the patient that basically indicates  if you quit smoking when your best day FEV1 is still relatively preserved (as is still  the case here)  it is highly unlikely you will progress to severe disease and informed the patient there was  no medication on the market that has proven to alter the curve/ its downward trajectory  or the likelihood of progression of their disease(unlike other chronic medical conditions such as atheroclerosis where we do think we can change the natural hx with risk reducing meds)    Therefore stopping smoking and maintaining abstinence are  the most important aspects of care, not choice of inhalers or for that matter, doctors.   Treatment other than smoking cessation  is entirely directed by severity of symptoms and focused also on reducing exacerbations, not attempting to change the natural history of the disease.   Assess willingness:  Not committed at this point Assist in quit attempt:  Per PCP when ready Arrange follow up:   Follow up per Primary Care planned

## 2021-03-31 NOTE — Patient Instructions (Signed)
Ok to use symbicort 160 up to 2 puffs every 12 hours as needed for breathing or coughing problems    If you are satisfied with your treatment plan,  let your doctor know and he/she can either refill your medications or you can return here when your prescription runs out.     If in any way you are not 100% satisfied,  please tell us.  If 100% better, tell your friends!  Pulmonary follow up is as needed

## 2021-04-01 ENCOUNTER — Telehealth: Payer: Medicare HMO

## 2021-04-01 ENCOUNTER — Telehealth: Payer: Self-pay

## 2021-04-01 NOTE — Telephone Encounter (Signed)
  Care Management   Follow Up Note   04/01/2021 Name: [name]. MRN: [id number] DOB: Mar 08, 1961   Referred by: Colon Branch, MD Reason for referral : Chronic Care Management [facility] outreach)   A second unsuccessful telephone outreach was attempted today. The patient was referred to the case management team for assistance with care management and care coordination.   Follow Up Plan: The care management team will reach out to the patient again over the next 30 days.   Thea Silversmith, RN, MSN, BSN, CCM Care Management Coordinator [facility] [phone]

## 2021-04-03 ENCOUNTER — Ambulatory Visit: Payer: Medicare HMO | Admitting: Pharmacist

## 2021-04-03 DIAGNOSIS — I25118 Atherosclerotic heart disease of native coronary artery with other forms of angina pectoris: Secondary | ICD-10-CM

## 2021-04-03 DIAGNOSIS — E1151 Type 2 diabetes mellitus with diabetic peripheral angiopathy without gangrene: Secondary | ICD-10-CM

## 2021-04-03 DIAGNOSIS — E785 Hyperlipidemia, unspecified: Secondary | ICD-10-CM

## 2021-04-03 DIAGNOSIS — F17200 Nicotine dependence, unspecified, uncomplicated: Secondary | ICD-10-CM

## 2021-04-03 DIAGNOSIS — I5032 Chronic diastolic (congestive) heart failure: Secondary | ICD-10-CM

## 2021-04-07 ENCOUNTER — Ambulatory Visit: Payer: Medicare HMO

## 2021-04-07 DIAGNOSIS — I5032 Chronic diastolic (congestive) heart failure: Secondary | ICD-10-CM

## 2021-04-07 DIAGNOSIS — I10 Essential (primary) hypertension: Secondary | ICD-10-CM

## 2021-04-07 NOTE — Patient Instructions (Signed)
Visit Information  PATIENT GOALS:  Goals Addressed             This Visit's Progress    Pharmacy Chronic Care Management Plan   On track    Current Barriers:  Unable to independently monitor therapeutic efficacy Unable to achieve control of Hyperlipidemia / high cholesterol Unable to maintain control of blood pressure and heart disease Does not adhere to prescribed medication regimen Does not contact provider office for questions/concerns  Pharmacist Clinical Goal(s):  Over the next 90 days, patient will verbalize ability to afford treatment regimen achieve adherence to monitoring guidelines and medication adherence to achieve therapeutic efficacy achieve control of Hyperlipidemia as evidenced by LDL <70 maintain control of Heart disease, blood pressure and type 2 diabetes as evidenced by attaining goals listed below  adhere to prescribed medication regimen as evidenced by refill history contact provider office for questions/concerns as evidenced notation of same in electronic health record through collaboration with PharmD and provider.   Interventions: 1:1 collaboration with Colon Branch, MD regarding development and update of comprehensive plan of care as evidenced by provider attestation and co-signature Inter-disciplinary care team collaboration (see longitudinal plan of care) Comprehensive medication review performed; medication list updated in electronic medical record  Diabetes: Uncontrolled; Goal A1c < 6.5% Last A1c was 7.0% (07/10/2020) Current treatment:  None Interventions:   Counseled on A1c goal Reviewed home blood goals  Fasting blood glucose goal (before meals) = 80 to 130 Blood glucose goal after a meal = less than 180  Recommend recheck A1c with next labs. If >6.5% consider starting medication therapy  Hypertension / Heart Disease:  Goal: decreased symptoms of heart disease, prevent exacerbations and BP <140/90.  Current treatment: Furosemide '20mg'$  - take  3 tablets = '60mg'$  daily in the morning Metoprolol tartrate '25mg'$  twice a day Spironolactone '25mg'$  - take 0.5 tablet = 12.'5mg'$  each morning Valsartan '80mg'$  daily Interventions:  Make sure you are only taking 0.5 tablet (one-half) of spironolactone daily. If you need a refill before insurance will allow you can ask [facility] to fill 15 tablets = 30 days of therapy off insurance. Cost should not be too high.  Reviewed blood pressure goal Continue to check blood pressure 2 to 3 times a week, record and bring to future appointments. Discussed signs and symptoms of worsening heart disease weight gain, shortness of breath, abdominal fullness, swelling in legs or abdomen, Fatigue and weakness, changes in ability to perform usual activities, persistent cough or wheezing with white or pink blood-tinged mucus, nausea and lack of appetite. Contact cardiologist or primary care physician if you experience any of these symptoms.  Recommend checking weigh every day. Report weight gain of more than 3 lbs in 24 hours or 5 lbs in 1 week. Provided information about how to purchase scales with [employer] over the counter benefits - gets $50 per quarter.   Hyperlipidemia with elevated triglycerides / history of stroke: LDL goal <70; Triglycerides goal <150 Last LDL was 116 and Triglycerides were 97 Current treatment: Atorvastatin '80mg'$  daily Fenofibrate '145mg'$  daily Aspirin '81mg'$  daily with breakfast (has not taken recently Clopidogrel '75mg'$  daily Isosorbide ER '60mg'$  0 take 1.5 tablets = '90mg'$  daily Nitroglycerin 0.'4mg'$  - place 1 tablet under the tongue and allow to dissolve as needed for chest pain Interventions:  Education provided about LDL and triglyceride goal Discussed benefits of getting LDL to goal and taking atorvastatin daily for worsening of heart disease / stroke Recommend recheck lipid panel with next labs;  If LDL > 70, consider restarting Repatha or similar medication (pharmacy team will help with  applying for assistance with Repatha cost).  Recommended restart aspirin '81mg'$  daily (since nosebleeds have no decreased since stopping)  Coordinated refill for fenofibrate with [employer] mail order Appointment made for follow up with Dr [name] 05/07/2021   Chronic Obstructive Pulmonary Disease / sleep apnea: Current treatment: CPAP Symbicort (budesonide/formoterol) 160/4.5 - inhale 2 puffs into lungs twice a day Interventions:  Coordinated with DME for trial of different mask to help him tolerate CPAP better Continue to follow up with pulmonologist  Continue current medications Reminded patient to rinse mouth after using Symbicort.  Discontinue smoking or at least decrease number of cigarettes smoked per day.  Reviewed his formulary - Chantix is tier 4 ($95) and buproprion tier 2 ($10) . Nicotine replacement products are not coverage on his formulary but he can get patches for $25/7 patches or gum $20/50 pieces through his [employer] O[employer] benefits. Limited to $50 / quarter.  Medication Management:  Goal: take medications as prescribed; consult clinical pharmacist if any questions or issues with medication access Current Pharmacy:  locally uses [facility].  [employer] Mail Order Interventions:  Discussed using either color stickers or color pill containers to help patient identify morning, afternoon, evening and night medications Reviewed each medication with patient and discussed how to take and reason for taking.   Patient Goals/Self-Care Activities Over the next 90 days, patient will:  Take medications as prescribed, focus on medication adherence by utilizing mail order pharmacy and contacting clinical pharmacist about any medications questions or concerns Check blood pressure 2 to 3 times per week, document, and provide at future appointments,  Weigh daily, and contact provider if weight gain of more than 3 lbs in 24 hours or 5 lbs in 1 week Collaborate with provider on medication  access solutions  Follow Up Plan: Telephone follow up appointment with care management team member scheduled for clinical pharmacy later this week (was already on schedule)          The patient verbalized understanding of instructions, educational materials, and care plan provided today and declined offer to receive copy of patient instructions, educational materials, and care plan.   Telephone follow up appointment with care management team member scheduled for: 2 to 3 weeks  Cherre Robins, PharmD Clinical Pharmacist [facility]

## 2021-04-07 NOTE — Patient Instructions (Signed)
Visit Information  PATIENT GOALS:  Goals Addressed             This Visit's Progress    track and manage my chronic health conditions       Timeframe:  Long-Range Goal Priority:  High Start Date:  04/07/21                           Expected End Date:  10/07/20                     Follow Up Date 04/22/21  Patient Goals: - take medications as prescribed - attend provider visits as scheduled - develop a rescue plan and follow a rescue plan for exacerbation of Heart Failure if symptoms flare-up. - eat more whole grains, fruits and vegetables, lean meats and healthy fats - know when to call the doctor - track symptoms and what helps feel better or worse        Patient verbalizes understanding of instructions provided today and agrees to view in [place].   Telephone follow up appointment with care management team member scheduled for: 04/22/21 The patient has been provided with contact information for the care management team and has been advised to call with any health related questions or concerns.   Thea Silversmith, RN, MSN, BSN, CCM Care Management Coordinator [facility] [phone]

## 2021-04-07 NOTE — Chronic Care Management (AMB) (Signed)
Chronic Care Management Pharmacy Note  04/07/2021 Name:  [name]. MRN:  [id number] DOB:  06/05/1961   Subjective: [name]. is an 60 y.o. year old male who is a primary patient of [name], [name], MD.  The CCM team was consulted for assistance with disease management and care coordination needs.    Engaged with patient by telephone for follow up visit in response to provider referral for pharmacy case management and/or care coordination services.   Consent to Services:  The patient was given information about Chronic Care Management services, agreed to services, and gave verbal consent prior to initiation of services.  Please see initial visit note for detailed documentation.   Patient Care Team: Colon Branch, MD as PCP - General (Internal Medicine) Burnell Blanks, MD as PCP - Cardiology (Cardiology) Ralene Bathe, MD as Consulting Physician (Ophthalmology) Pyrtle, Lajuan Lines, MD as Consulting Physician (Gastroenterology) Susa Day, MD as Consulting Physician (Orthopedic Surgery) Luretha Rued, RN as Case Manager Cherre Robins, PharmD (Pharmacist) Rozetta Nunnery, MD as Consulting Physician (Otolaryngology)  Recent office visits: 10/22/2020 - PCP (Dr [name]) - F/U chronic conditions. Removed Repatha from med list as pt reported no longer taking due to cost. Patient also was not taking metformin - new Rx sent. Recommended restart trazodone for depression / insomnia. Referral for outpatient speach therapy.   Recent consult visits: 03/31/2021 - Pulmonology (Dr Melvyn Novas) F/U COPD. No medication changes.  PFT's  03/31/2021  FEV1 1.93 (53 % ) ratio 0.70  p 13 % improvement from saba p 0 prior to study with   FV curve no signficant curvature. 03/10/2021 - Pulm Elie Confer, NP) Phone Visit - Lung Cancer Screeing education session. CT scheduled.  01/28/2021 - Pulm (Parrett, NP) F/U cough OSA and SOB. Suspects underlying COPD - ordered PFTs. Recommended restart  CPAP. Ordered DME to change CPAP to Auto set 10-20. 01/28/2021 Card[facility] ([facility]) F/U CHFpEF. Stopped potassium supplement; Started spironolacton 11m - take 0.5 tablet daily. Consider SGLT2 or Entresto in future.   Hospital visits: None in previous 6 months  Objective:  Lab Results  Component Value Date   CREATININE 1.02 02/19/2021   CREATININE 0.94 02/12/2021   CREATININE 0.99 01/28/2021    Lab Results  Component Value Date   HGBA1C 7.0 (H) 07/10/2020   Last diabetic Eye exam: No results found for: HMDIABEYEEXA  Last diabetic Foot exam: No results found for: HMDIABFOOTEX      Component Value Date/Time   CHOL 171 11/14/2019 1441   TRIG 97 11/14/2019 1441   HDL 37 (L) 11/14/2019 1441   CHOLHDL 4.6 11/14/2019 1441   CHOLHDL 4.3 11/27/2018 0441   VLDL 35 11/27/2018 0441   LDLCALC 116 (H) 11/14/2019 1441   LDLDIRECT 116.0 05/12/2013 1517    Hepatic Function Latest Ref Rng & Units 07/10/2020 11/26/2018 08/24/2017  Total Protein 6.0 - 8.3 g/dL 7.1 7.8 7.1  Albumin 3.5 - 5.2 g/dL 4.0 3.9 4.2  AST 0 - 37 U/L '12 22 23  ' ALT 0 - 53 U/L '13 19 23  ' Alk Phosphatase 39 - 117 U/L 135(H) 88 104  Total Bilirubin 0.2 - 1.2 mg/dL 1.2 2.1(H) 1.0  Bilirubin, Direct 0.0 - 0.3 mg/dL - - -    Lab Results  Component Value Date/Time   TSH 1.895 07/07/2016 06:31 PM   TSH 1.88 05/05/2013 12:14 PM   TSH 2.290 **Test methodology is 3rd generation TSH** 06/23/2009 07:55 AM    CBC Latest  Ref Rng & Units 12/27/2019 11/26/2018 01/05/2018  WBC 4.0 - 10.5 K/uL 7.4 7.7 7.2  Hemoglobin 13.0 - 17.0 g/dL 15.5 17.4(H) 15.9  Hematocrit 39.0 - 52.0 % 47.6 53.7(H) 47.4  Platelets 150.0 - 400.0 K/uL 221.0 165 194.0    Lab Results  Component Value Date/Time   VD25OH 10.8 (L) 11/03/2016 02:53 PM    Clinical ASCVD: Yes  The ASCVD Risk score Mikey Bussing DC Jr., et al., 2013) failed to calculate for the following reasons:   The patient has a prior MI or stroke diagnosis     Social History   Tobacco Use   Smoking Status Every Day   Packs/day: 2.00   Years: 45.00   Pack years: 90.00   Types: Cigarettes   Start date: 1976  Smokeless Tobacco Never  Tobacco Comments   1 pack daily-03/10/21   BP Readings from Last 3 Encounters:  03/31/21 118/66  03/10/21 124/72  01/28/21 102/60   Pulse Readings from Last 3 Encounters:  03/31/21 (!) 56  03/10/21 (!) 56  01/28/21 62   Wt Readings from Last 3 Encounters:  03/31/21 222 lb (100.7 kg)  03/10/21 217 lb 3.2 oz (98.5 kg)  01/28/21 218 lb (98.9 kg)    Assessment: Review of patient past medical history, allergies, medications, health status, including review of consultants reports, laboratory and other test data, was performed as part of comprehensive evaluation and provision of chronic care management services.   SDOH:  (Social Determinants of Health) assessments and interventions performed:  SDOH Interventions    Flowsheet Row Most Recent Value  SDOH Interventions   Transportation Interventions [employer], Other (Comment)  [patient states he has a friend that takes him to most appointments. He needs to complete some paperwork for [facility] transportation. Reminded patient to do this.]        CCM Care Plan  No Known Allergies  Medications Reviewed Today     Reviewed by Cherre Robins, PharmD (Pharmacist) on 04/03/21 at [facility] List Status: <None>   Medication Order Taking? Sig Documenting Provider Last Dose Status Informant  aspirin EC 81 MG EC tablet [id number] Yes Take 1 tablet (81 mg total) by mouth daily with breakfast. Roxan Hockey, MD Taking Active   atorvastatin (LIPITOR) 80 MG tablet [id number] Yes Take 1 tablet (80 mg total) by mouth daily at 6 PM. Burnell Blanks, MD Taking Active   budesonide-formoterol [facility]) 160-4.5 MCG/ACT inhaler [id number] No Inhale 2 puffs into the lungs 2 (two) times daily.  Patient not taking: Reported on 04/03/2021   Colon Branch, MD Not Taking Active   clopidogrel  (PLAVIX) 75 MG tablet [id number] Yes TAKE 1 TABLET (75 MG TOTAL) BY MOUTH DAILY. Burnell Blanks, MD Taking Active   fenofibrate (TRICOR) 145 MG tablet [id number] No Take 1 tablet (145 mg total) by mouth daily.  Patient not taking: Reported on 04/03/2021   Colon Branch, MD Not Taking Active   folic acid (FOLVITE) 1 MG tablet [id number] Yes Take 1 tablet (1 mg total) by mouth daily. Roxan Hockey, MD Taking Active   furosemide (LASIX) 20 MG tablet [id number] Yes Take 3 tablets (60 mg total) by mouth daily. Burnell Blanks, MD Taking Active   isosorbide mononitrate (IMDUR) 60 MG 24 hr tablet [id number] Yes Take 1.5 tablets (90 mg total) by mouth daily. Burnell Blanks, MD Taking Active   metoprolol tartrate (LOPRESSOR) 25 MG tablet [id number] Yes Take 1 tablet (25 mg total) by mouth 2 (two) times  daily. Burnell Blanks, MD Taking Active   nitroGLYCERIN (NITROSTAT) 0.4 MG SL tablet [id number] Yes Place 1 tablet (0.4 mg total) under the tongue every 5 (five) minutes x 3 doses as needed for chest pain. Colon Branch, MD Taking Active   pantoprazole (PROTONIX) 40 MG tablet [id number] Yes TAKE 1 TABLET TWICE DAILY Burnell Blanks, MD Taking Active   spironolactone (ALDACTONE) 25 MG tablet [id number] Yes Take 0.5 tablets (12.5 mg total) by mouth daily. Burnell Blanks, MD Taking Active   tamsulosin [facility]) 0.4 MG CAPS capsule [id number] Yes Take 0.4 mg by mouth at bedtime. [provider] Taking Active   thiamine 100 MG tablet [id number] Yes Take 1 tablet (100 mg total) by mouth daily. Roxan Hockey, MD Taking Active   traZODone (DESYREL) 50 MG tablet [id number] Yes Take 1 tablet (50 mg total) by mouth at bedtime.  Patient taking differently: Take 100 mg by mouth at bedtime.   Colon Branch, MD Taking Active   valsartan (DIOVAN) 80 MG tablet [id number] Yes Take 1 tablet (80 mg total) by mouth daily. Burnell Blanks, MD Taking Active              Patient Active Problem List   Diagnosis Date Noted   COPD 0 AB 03/31/2021   (HFpEF) heart failure with preserved ejection fraction (HCC)    DOE (dyspnea on exertion) 11/07/2020   Diabetes ([place]) 03/18/2020   Prolapsed lumbar disc 01/25/2020   Closed fracture of lateral malleolus 01/25/2020   Hiatal hernia    Hepatic steatosis    Hemianopia, homonymous, right    ETOH abuse    Esophagitis    Chronic diastolic CHF (congestive heart failure) (HCC)    Chronic bronchitis ([place])    CAD (coronary artery disease)    PAD (peripheral artery disease) ([place]) 09/23/2016   Morbid obesity ([place])    Chronic pain syndrome 10/23/2015   GERD (gastroesophageal reflux disease) 08/07/2015   Pancreatitis, acute    PCP NOTES >>>>> 06/01/2015   Annual physical exam 05/31/2015   Acute ischemic stroke ([place]) 02/20/2015   Hx of cocaine abuse ([place]) 02/20/2015   BPH (benign prostatic hyperplasia) 02/20/2015   OSA (obstructive sleep apnea) ?? 05/05/2013   Noncompliance 05/05/2013   Anxiety and depression 05/05/2013   Erectile dysfunction 03/17/2012   Emphysema (subcutaneous) (surgical) resulting from a procedure 10/08/2008   Iron deficiency anemia 08/10/2008   ABNORMALITY OF GAIT 08/06/2008   Cigarette smoker 07/27/2008   Dyslipidemia 07/26/2008   Essential hypertension 07/26/2008   ST elevation myocardial infarction involving left circumflex coronary artery ([place]) 07/26/2008    Immunization History  Administered Date(s) Administered   PFIZER(Purple Top)SARS-COV-2 Vaccination 11/17/2019, 12/11/2019, 07/18/2020   Pneumococcal Polysaccharide-23 11/03/2016   Tdap 11/03/2016    Conditions to be addressed/monitored: CHF, CAD, HTN, HLD, Hypertriglyceridemia, COPD, DMII, and BPH; OSA; GERD; fatty liver; h/o strokePAD; sleep disturbance; anxiety/ depression  Care Plan : Pharmacy Care Plan  Updates made by Cherre Robins, PHARMD since 04/07/2021 12:00 AM     Problem: HTN; CHF: CAD; GERD with esophagitis;  depression; BPH; COPD; bronchitis; stroke; elevated TG and hyperlipidemia; type 2 DM with h/o pancreatitis   Priority: High  Onset Date: 03/18/2021     Long-Range Goal: Management of chronic conditions and medications   Start Date: 03/18/2021  Priority: High  Note:   Current Barriers:  Unable to independently monitor therapeutic efficacy Unable to achieve control of Hyperlipidemia  Unable to maintain control of HTN, CHF Does not adhere  to prescribed medication regimen Does not contact provider office for questions/concerns  Pharmacist Clinical Goal(s):  Over the next 90 days, patient will verbalize ability to afford treatment regimen achieve adherence to monitoring guidelines and medication adherence to achieve therapeutic efficacy achieve control of Hyperlipidemia as evidenced by LDL <70 maintain control of CHF, HTN and type 2 DM as evidenced by attaining goals listed below  adhere to prescribed medication regimen as evidenced by refill history contact provider office for questions/concerns as evidenced notation of same in electronic health record through collaboration with PharmD and provider.   Interventions: 1:1 collaboration with Colon Branch, MD regarding development and update of comprehensive plan of care as evidenced by provider attestation and co-signature Inter-disciplinary care team collaboration (see longitudinal plan of care) Comprehensive medication review performed; medication list updated in electronic medical record  Diabetes: Uncontrolled; Goal A1c < 6.5% Last A1c was 7.0% (07/10/2020) Current treatment:  None Tried metformin in past but stopped due to GI side effects (tried 564m/day and 2553mday) History of pancreatitis (possibly related to alcohol use)   Current glucose readings: does not currently check BG at home Denies hypoglycemic/hyperglycemic symptoms Current meal patterns: over the last 2 to 3 weeks has been limiting bread and sweets Current exercise:  none currently Interventions: (addressed at previous visits)   Counseled on A1c and BG goals ,  Recommend recheck A1c with next labs. If >6.5% consider starting SGLT2)  Hypertension / CHF: Improving; goal: decreased symptoms of CHF, prevent exacerbations and BP <140/90.  Current treatment: Furosemide 2080m take 3 tablets = 82m66mily in the morning Metoprolol tartrate 25mg23mce a day Spironolactone 25mg 55mke 0.5 tablet = 12.5mg ea78mmorning Valsartan 80mg da85mCurrent BP home readings: 118-142 / 64-75; had a reading of 118/66 today EF = 55-60% (01/11/2091/42/68342 systolic dysfunction NYHA 2b Past medications tried: lisinopril 10mg - c76me to valsartan by pulmonologist.  Denies hypotensive/hypertensive symptoms Interventions:  Reviewed BP goals Discussed signs and symptoms of CHF exacerbation - weight gain, SOB, abdominal fullness, swelling in legs or abdomen, Fatigue and weakness, changes in ability to perform usual activities, persistent cough or wheezing with white or pink blood-tinged mucus, nausea and lack of appetite Recommended he weigh daily - report weight gain of more than 3 lbs in 24 hours or 5 lbs in 1 week. Discussed ordering scale from [employer] [facility] benefits - he gets $50 per quarter.  Hyperlipidemia / CAD / history of stroke / PAD: Uncontrolled; LDL goal <70; Tg goal <150 Last LDL was 116 and Tg was 97 Current treatment: Atorvastatin 80mg dail3mnofibrate 145mg daily59mirin 81mg daily 39m breakfast  Clopidogrel 75mg daily I78mrbide ER 82mg 0 take 159mablets = 90mg daily Nit68mycerin 0.4mg - place 1 t50met under the tongue and allow to dissolve as needed for chest pain Medications previously tried: Repatha - stopped due to cost; Brilenta  - stopped due to shortness of breath Patient reports he has restarted aspirin. He has not noted increase in nosebleeds but still has 1 nosebleed every night on right side only. He does have ENT appt for  04/28/2021 at 3:30pm Ran out of fenofibrate 7 days ago. Called [employer] mail order - has been shipped. Should arrive 04/05/2021 to 04/07/2021 Interventions:  Continue aspirin 81mg daily. Foll64mp as planned with ENT Checked on mail order prescription for fenofibrate and verified deliver date (latest patient should receive is 04/07/2021) Follow up appointment made for patient to see PCP in September.  Chronic Obstructive Pulmonary Disease / OSA: Uncontrolled;  Current treatment: CPAP Symbicort (budesonide/formoterol) 160/4.5 - inhale 2 puffs into lungs twice a day Managed by pulmonologist - Dr Melvyn Novas- has an appointment later today PFT's  03/31/2021  FEV1 1.93 (53 % ) ratio 0.70  p 13 % improvement from saba p 0 prior to study with   FV curve no signficant curvature Previous medications tried: Spiriva - not effective Interventions:  Continue to follow up with pulmonologist  Continue current medications Reminded patient to rinse mouth after using Symbicort.   Tobacco Use disorder:  Patient has restarted smoking after quitting for about 4 months.  Reports smoking 1ppd Patient states he wants to quit.  Past therapies tried: nicotine patches (not effective) ; Chantix (not helpful); Cold [place] - relapsed.  Interventions:  Reviewed his formulary - Chantix is tier 4 ($95) and buproprion tier 2 ($10) . Nicotine replacement products are not coverage on his formulary but he can get patches for $25/7 patches or gum $20/50 pieces through his [employer] OTC benefits. Limited to $50 / quarter.  Medication Management:  Goal: take medications as prescribed; consult pharmacy if any questions or issues with medication access Current Pharmacy: locally uses [facility]. Recently has transitioned most medications to [facility] Patient does not have transportation so mail order is a great option to improve access Patient also has homonymous hemianopsia that resulted from one of his strokes.  Sometimes has difficulty reading labels on his prescription bottles.  His sister, Alyse Low is helping patient with medications as she is able (she does live in [place] so she is not able to see him frequently)  Interventions: (at previous visit)  Discussed using either stickers of color pill containers to help patient identify morning, afternoon, evening and night medications Reviewed each medication with patient and discussed how to take and reason for taking.    Patient Goals/Self-Care Activities Over the next 90 days, patient will:  take medications as prescribed, focus on medication adherence by utilizing mail order pharmacy and contacting clinical pharmacist about any medications questions or concerns, check blood pressure 2 to 3 times per week, document, and provide at future appointments, weigh daily, and contact provider if weight gain of more than 3 lbs in 24 hours or 5 lbs in 1 week, and collaborate with provider on medication access solutions  Follow Up Plan: Telephone follow up appointment with care management team member scheduled for:  2 to 3 weeks            Medication Assistance:  none at this time but might need assistance if additional lipid lowering therapy needed in future.   Patient's preferred pharmacy is:  [facility], [place] - [address] [place] [address] Phone: [phone] Fax: [phone]  [facility] ([facility] - [place], [address] [place] [address] Phone: [phone] Fax: [phone]  Uses pill box? No - patient has declined to use pill container but he is starting to use stickers on bottles to help identify time of day to take each medication  Follow Up:  Patient agrees to Care Plan and Follow-up.  Plan: follow up phone call with clinical pharmacist planned for 2 to 3 weeks.  Cherre Robins,  PharmD Clinical Pharmacist [facility]

## 2021-04-07 NOTE — Chronic Care Management (AMB) (Signed)
Chronic Care Management   CCM RN Visit Note  04/07/2021 Name: [name]. MRN: [id number] DOB: 20-Sep-1960  Subjective: [name]. is a 60 y.o. year old male who is a primary care patient of Colon Branch, MD. The care management team was consulted for assistance with disease management and care coordination needs.    Engaged with patient by telephone for initial visit in response to provider referral for case management and/or care coordination services.   Consent to Services:  The patient was given information about Chronic Care Management services, agreed to services, and gave verbal consent prior to initiation of services.  Please see initial visit note for detailed documentation.   Patient agreed to services and verbal consent obtained.   Assessment: Review of patient past medical history, allergies, medications, health status, including review of consultants reports, laboratory and other test data, was performed as part of comprehensive evaluation and provision of chronic care management services.   SDOH (Social Determinants of Health) assessments and interventions performed:    CCM Care Plan  No Known Allergies  Outpatient Encounter Medications as of 04/07/2021  Medication Sig   aspirin EC 81 MG EC tablet Take 1 tablet (81 mg total) by mouth daily with breakfast.   atorvastatin (LIPITOR) 80 MG tablet Take 1 tablet (80 mg total) by mouth daily at 6 PM.   budesonide-formoterol (SYMBICORT) 160-4.5 MCG/ACT inhaler Inhale 2 puffs into the lungs 2 (two) times daily. (Patient not taking: Reported on 04/03/2021)   clopidogrel (PLAVIX) 75 MG tablet TAKE 1 TABLET (75 MG TOTAL) BY MOUTH DAILY.   fenofibrate (TRICOR) 145 MG tablet Take 1 tablet (145 mg total) by mouth daily. (Patient not taking: Reported on 5/00/3704)   folic acid (FOLVITE) 1 MG tablet Take 1 tablet (1 mg total) by mouth daily.   furosemide (LASIX) 20 MG tablet Take 3 tablets (60 mg total) by mouth daily.    isosorbide mononitrate (IMDUR) 60 MG 24 hr tablet Take 1.5 tablets (90 mg total) by mouth daily.   metoprolol tartrate (LOPRESSOR) 25 MG tablet Take 1 tablet (25 mg total) by mouth 2 (two) times daily.   nitroGLYCERIN (NITROSTAT) 0.4 MG SL tablet Place 1 tablet (0.4 mg total) under the tongue every 5 (five) minutes x 3 doses as needed for chest pain.   pantoprazole (PROTONIX) 40 MG tablet TAKE 1 TABLET TWICE DAILY   spironolactone (ALDACTONE) 25 MG tablet Take 0.5 tablets (12.5 mg total) by mouth daily.   tamsulosin (FLOMAX) 0.4 MG CAPS capsule Take 0.4 mg by mouth at bedtime.   thiamine 100 MG tablet Take 1 tablet (100 mg total) by mouth daily.   traZODone (DESYREL) 50 MG tablet Take 1 tablet (50 mg total) by mouth at bedtime. (Patient taking differently: Take 100 mg by mouth at bedtime.)   valsartan (DIOVAN) 80 MG tablet Take 1 tablet (80 mg total) by mouth daily.   No facility-administered encounter medications on file as of 04/07/2021.    Patient Active Problem List   Diagnosis Date Noted   COPD 0 AB 03/31/2021   (HFpEF) heart failure with preserved ejection fraction (HCC)    DOE (dyspnea on exertion) 11/07/2020   Diabetes ([place]) 03/18/2020   Prolapsed lumbar disc 01/25/2020   Closed fracture of lateral malleolus 01/25/2020   Hiatal hernia    Hepatic steatosis    Hemianopia, homonymous, right    ETOH abuse    Esophagitis    Chronic diastolic CHF (congestive heart failure) ([place])  Chronic bronchitis (HCC)    CAD (coronary artery disease)    PAD (peripheral artery disease) ([place]) 09/23/2016   Morbid obesity ([place])    Chronic pain syndrome 10/23/2015   GERD (gastroesophageal reflux disease) 08/07/2015   Pancreatitis, acute    PCP NOTES >>>>> 06/01/2015   Annual physical exam 05/31/2015   Acute ischemic stroke ([place]) 02/20/2015   Hx of cocaine abuse ([place]) 02/20/2015   BPH (benign prostatic hyperplasia) 02/20/2015   OSA (obstructive sleep apnea) ?? 05/05/2013   Noncompliance  05/05/2013   Anxiety and depression 05/05/2013   Erectile dysfunction 03/17/2012   Emphysema (subcutaneous) (surgical) resulting from a procedure 10/08/2008   Iron deficiency anemia 08/10/2008   ABNORMALITY OF GAIT 08/06/2008   Cigarette smoker 07/27/2008   Dyslipidemia 07/26/2008   Essential hypertension 07/26/2008   ST elevation myocardial infarction involving left circumflex coronary artery ([place]) 07/26/2008    Conditions to be addressed/monitored:CHF, CAD, HTN, HLD, COPD, and DMII  Care Plan : Cardiovascular disease  Updates made by Luretha Rued, RN since 04/08/2021 12:00 AM     Problem: Knowledge deficit related to long term care plan for self-managment of cardiovascular disease in a patient with CHF, HTN, CAD, DM, COPD, current smoker   Priority: High     Long-Range Goal: Monitor and Manage chronic disease   Start Date: 04/07/2021  Expected End Date: 10/07/2021  Priority: High  Note:   Objective:  Lab Results  Component Value Date   HGBA1C 7.0 (H) 07/10/2020   Lab Results  Component Value Date   CREATININE 1.02 02/19/2021   CREATININE 0.94 02/12/2021   CREATININE 0.99 01/28/2021   Lab Results  Component Value Date   EGFR 85 02/19/2021  Objective:  Last practice recorded BP readings:  BP Readings from Last 3 Encounters:  03/31/21 118/66  03/10/21 124/72  01/28/21 102/60  Most recent eGFR/CrCl:  Lab Results  Component Value Date   EGFR 85 02/19/2021  Current Barriers:  Knowledge Deficits related to long term care plan for self-management of cardiovascular disease in a patient with CHF, HTN, COPD, DM.  Unable to independently manage medications Does not attend all scheduled provider appointments Vision impairment-legally blind Does not have a readable scale Transportation barriers: patient request additional transportation resources. Case Manager Clinical Goal(s):  patient will verbalize understanding of plan for self-management of cardiovascular,  pulmonary and diabetes.  patient will obtain scale and begin to weigh and record weights patient will verbalize understanding of Heart Failure Action Plan and when to call doctor patient will take all mediations as prescribed Interventions:  Collaboration with Colon Branch, MD regarding development and update of comprehensive plan of care as evidenced by provider attestation and co-signature Inter-disciplinary care team collaboration (see longitudinal plan of care) Evaluation of current treatment plan related to chronic disease self management and patient's adherence to plan as established by provider. Unable to review medications as patient was not at home. However medications reviewed 04/03/21 per clinical pharmacist. Encouraged patient to take medications as prescribed. Encouraged patient to contact [facility] customer service regarding OTC benefit to obtain scales. Discussed importance of daily weight and advised patient to weigh and record daily. Discussed importance and provided information regarding low salt diet. Discussed importance of routinely keeping track of blood pressure readings. Patient going out of town next week: encouraged client to take his medications with him. Encouraged client to eat healthy and monitor salt intake. Discussed plans with patient for ongoing care management follow up and provided patient with direct contact  information for care management team. Discussed transportation resources with patient. Referral placed for care guide: patient requesting additional transportation resources. Patient Goals: - take medications as prescribed - attend provider visits as scheduled - develop a rescue plan and follow a rescue plan for exacerbation of Heart Failure if symptoms flare-up. - know when to call the doctor. signs/symptoms of worsening: weight gain of 3 pounds overnight, increased swelling in stomach, feet/legs or hands. Increased shortness of breath, having less energy  than normal, new or worsening dizziness, if it is harder for you to breathe when lying down and you need to sit up to breathe or you feel that something is not right.  - eat more whole grains, fruits and vegetables, lean meats and healthy fats - track symptoms and what helps feel better or worse - contact your insurance company regarding over the counter benefit regarding scales Follow Up Plan: The patient has been provided with contact information for the care management team and has been advised to call with any health related questions or concerns.  The care management team will reach out to the patient again over the next 30 days.      Plan:The patient has been provided with contact information for the care management team and has been advised to call with any health related questions or concerns.  and The care management team will reach out to the patient again over the next 30 days.  Thea Silversmith, RN, MSN, BSN, CCM Care Management Coordinator [facility] [phone]

## 2021-04-09 DIAGNOSIS — E785 Hyperlipidemia, unspecified: Secondary | ICD-10-CM

## 2021-04-09 DIAGNOSIS — J449 Chronic obstructive pulmonary disease, unspecified: Secondary | ICD-10-CM

## 2021-04-09 DIAGNOSIS — E1151 Type 2 diabetes mellitus with diabetic peripheral angiopathy without gangrene: Secondary | ICD-10-CM

## 2021-04-09 DIAGNOSIS — I5032 Chronic diastolic (congestive) heart failure: Secondary | ICD-10-CM | POA: Diagnosis not present

## 2021-04-09 DIAGNOSIS — I10 Essential (primary) hypertension: Secondary | ICD-10-CM

## 2021-04-09 DIAGNOSIS — I25118 Atherosclerotic heart disease of native coronary artery with other forms of angina pectoris: Secondary | ICD-10-CM

## 2021-04-11 ENCOUNTER — Emergency Department (HOSPITAL_COMMUNITY)
Admission: EM | Admit: 2021-04-11 | Discharge: 2021-04-11 | Disposition: A | Discharge status: Home / Self Care | Payer: Medicare HMO | Attending: Emergency Medicine | Admitting: Emergency Medicine

## 2021-04-11 ENCOUNTER — Telehealth: Payer: Self-pay | Admitting: Pharmacist

## 2021-04-11 ENCOUNTER — Encounter (HOSPITAL_COMMUNITY): Payer: Self-pay

## 2021-04-11 ENCOUNTER — Emergency Department (HOSPITAL_COMMUNITY): Payer: Medicare HMO

## 2021-04-11 DIAGNOSIS — Z7982 Long term (current) use of aspirin: Secondary | ICD-10-CM | POA: Diagnosis not present

## 2021-04-11 DIAGNOSIS — R10811 Right upper quadrant abdominal tenderness: Secondary | ICD-10-CM | POA: Diagnosis not present

## 2021-04-11 DIAGNOSIS — I5032 Chronic diastolic (congestive) heart failure: Secondary | ICD-10-CM | POA: Diagnosis not present

## 2021-04-11 DIAGNOSIS — Z951 Presence of aortocoronary bypass graft: Secondary | ICD-10-CM | POA: Diagnosis not present

## 2021-04-11 DIAGNOSIS — Z79899 Other long term (current) drug therapy: Secondary | ICD-10-CM | POA: Diagnosis not present

## 2021-04-11 DIAGNOSIS — R109 Unspecified abdominal pain: Secondary | ICD-10-CM

## 2021-04-11 DIAGNOSIS — R1084 Generalized abdominal pain: Secondary | ICD-10-CM | POA: Diagnosis not present

## 2021-04-11 DIAGNOSIS — I110 Hypertensive heart disease with heart failure: Secondary | ICD-10-CM | POA: Insufficient documentation

## 2021-04-11 DIAGNOSIS — I2510 Atherosclerotic heart disease of native coronary artery without angina pectoris: Secondary | ICD-10-CM | POA: Insufficient documentation

## 2021-04-11 DIAGNOSIS — R1011 Right upper quadrant pain: Secondary | ICD-10-CM | POA: Diagnosis not present

## 2021-04-11 DIAGNOSIS — Z7902 Long term (current) use of antithrombotics/antiplatelets: Secondary | ICD-10-CM | POA: Insufficient documentation

## 2021-04-11 DIAGNOSIS — F17210 Nicotine dependence, cigarettes, uncomplicated: Secondary | ICD-10-CM | POA: Insufficient documentation

## 2021-04-11 DIAGNOSIS — J449 Chronic obstructive pulmonary disease, unspecified: Secondary | ICD-10-CM | POA: Diagnosis not present

## 2021-04-11 LAB — COMPREHENSIVE METABOLIC PANEL
ALT: 15 U/L (ref 0–44)
AST: 18 U/L (ref 15–41)
Albumin: 4.3 g/dL (ref 3.5–5.0)
Alkaline Phosphatase: 66 U/L (ref 38–126)
Anion gap: 9 (ref 5–15)
BUN: 18 mg/dL (ref 6–20)
CO2: 26 mmol/L (ref 22–32)
Calcium: 9.2 mg/dL (ref 8.9–10.3)
Chloride: 100 mmol/L (ref 98–111)
Creatinine, Ser: 1.02 mg/dL (ref 0.61–1.24)
GFR, Estimated: >60 mL/min (ref >60)
Glucose, Bld: 125 mg/dL — ABNORMAL HIGH (ref 70–99)
Potassium: 3.8 mmol/L (ref 3.5–5.1)
Sodium: 135 mmol/L (ref 135–145)
Total Bilirubin: 1.5 mg/dL — ABNORMAL HIGH (ref 0.3–1.2)
Total Protein: 7.9 g/dL (ref 6.5–8.1)

## 2021-04-11 LAB — CBC WITH DIFFERENTIAL/PLATELET
Abs Immature Granulocytes: 0 10*3/uL (ref 0.00–0.07)
Basophils Absolute: 0.1 10*3/uL (ref 0.0–0.1)
Basophils Relative: 1 %
Eosinophils Absolute: 0.1 10*3/uL (ref 0.0–0.5)
Eosinophils Relative: 1 %
HCT: 45.9 % (ref 39.0–52.0)
Hemoglobin: 15.2 g/dL (ref 13.0–17.0)
Lymphocytes Relative: 10 %
Lymphs Abs: 1.1 10*3/uL (ref 0.7–4.0)
MCH: 29 pg (ref 26.0–34.0)
MCHC: 33.1 g/dL (ref 30.0–36.0)
MCV: 87.4 fL (ref 80.0–100.0)
Monocytes Absolute: 0.9 10*3/uL (ref 0.1–1.0)
Monocytes Relative: 8 %
Neutro Abs: 8.8 10*3/uL — ABNORMAL HIGH (ref 1.7–7.7)
Neutrophils Relative %: 80 %
Platelets: 211 10*3/uL (ref 150–400)
RBC: 5.25 MIL/uL (ref 4.22–5.81)
RDW: 23.4 % — ABNORMAL HIGH (ref 11.5–15.5)
WBC: 11 10*3/uL — ABNORMAL HIGH (ref 4.0–10.5)
nRBC: 0 % (ref 0.0–0.2)
nRBC: 0 /100 WBC

## 2021-04-11 LAB — LIPASE, BLOOD: Lipase: 30 U/L (ref 11–51)

## 2021-04-11 MED ORDER — IOHEXOL 350 MG/ML SOLN
80.0000 mL | Freq: Once | INTRAVENOUS | Status: AC | PRN
  Administered 2021-04-11: 80 mL via INTRAVENOUS

## 2021-04-11 MED ORDER — HYDROCODONE-ACETAMINOPHEN 5-325 MG PO TABS: 1.0000 | ORAL_TABLET | Freq: Four times a day (QID) | ORAL | 0 refills | Status: DC | PRN

## 2021-04-11 MED ORDER — HYDROMORPHONE HCL 1 MG/ML IJ SOLN
1.0000 mg | Freq: Once | INTRAMUSCULAR | Status: AC
  Administered 2021-04-11: 1 mg via INTRAVENOUS
  Filled 2021-04-11: qty 1

## 2021-04-11 MED ORDER — SODIUM CHLORIDE 0.9 % IV SOLN: INTRAVENOUS | Status: DC

## 2021-04-11 MED ORDER — ONDANSETRON HCL 4 MG/2ML IJ SOLN
4.0000 mg | Freq: Once | INTRAMUSCULAR | Status: AC
  Administered 2021-04-11: 4 mg via INTRAVENOUS
  Filled 2021-04-11: qty 2

## 2021-04-11 NOTE — ED Notes (Signed)
Got patient into a gown on the monitor patient is resting with call bell in reach 

## 2021-04-11 NOTE — Discharge Instructions (Addendum)
Work-up here without any acute findings.  Feel that the pain is most likely abdominal wall.  Make an appointment to follow-up with your primary care doctor.  Take the pain medicine as directed.  Return for any new or worse symptoms.

## 2021-04-11 NOTE — ED Provider Notes (Signed)
[facility] EMERGENCY DEPARTMENT Provider Note   CSN: [id number] Arrival date & time: 04/11/21  1010     History Chief Complaint  Patient presents with   Abdominal Pain    [name]. is a 60 y.o. male.  Patient with right upper quadrant abdominal tenderness.  Below the ribs.  Made worse by moving.  Worse by taking a deep breath.  Been present for 3 days.  But got worse last night.  No nausea or vomiting.  Patient's past medical history significant for chronic diastolic heart failure COPD coronary artery disease type 2 diabetes hypertension.  Patient had a CT scan back in May 2018 that showed a pancreas pseudocyst.  Patient denies any fall or injury.      Past Medical History:  Diagnosis Date   (HFpEF) heart failure with preserved ejection fraction ([place])    Echo 6/22: EF 55-60, no RWMA, GR 3 DD, elevated LVEDP, normal RVSF, RVSP 53.2, mild to moderate AV sclerosis without stenosis, dilated aortic root (41 mm), ascending aorta 37 mm   Abnormality of gait    Anxiety    CAD (coronary artery disease)    a. 10/2008 Inferolateral STEMI: 3VD w/ occluded LCX (PTCA)-->CABG x 3 (LIMA->LAD, VG->OM, VG->PDA);  b. 06/2009 NSTEMI/PCI: VG->OM 100 (BMS);  c. 05/2013 native 3VD, 3/3 patent grafts. d. 06/2016: Inferior STEMI w/ occluded SVG-OM (DES placed). LIMA-LAD and SVG-PDA patent.   Chronic bronchitis (HCC)    Chronic diastolic CHF (congestive heart failure) ([place])    a. 02/2015 Echo: EF 50-55%, distal septal, apical, inferobasal HK, mild LVH, mild MR, mildly dil LA.   Cocaine use    Colon polyps    a. 09/2015 colonscopy: multiple sessile polyps, path negative for high grade dysplasia.   Depression    Emphysema (subcutaneous) (surgical) resulting from a procedure 10/2008.   Bleb resected at time of 10/2008 CABG. Marketed emphysema noted on surgical report.   Esophagitis    a. 2017 EGD: esophagitis, duodenitis.   ETOH abuse    GERD (gastroesophageal reflux disease)     Hemianopia, homonymous, right    "can't see out the right side of either eye since stroke in 2016/pt description 09/23/2016   Hepatic steatosis    Hiatal hernia    History of nuclear stress test 08/2017   Nuclear stress test 1/19: EF 35, inf-lat and ant-lat, apical sept/apical scar; no ischemia; Intermediate Risk   Hyperlipidemia    Hypertension    Iron deficiency anemia 2010   Morbid obesity ([place])    Myocardial infarction [facility]) "several"   PAD (peripheral artery disease) ([place])    Pancreatitis 06/2015   Sleep apnea    "don't have a mask" (09/23/2016)   Stroke ([place]) 02/2015   a. 02/2015 Carotid U/S: 1-39% bilat ICA stenosis; "left me blind" (09/23/2016)   Tobacco abuse    still smokes   Tubular adenoma of colon     Patient Active Problem List   Diagnosis Date Noted   COPD 0 AB 03/31/2021   (HFpEF) heart failure with preserved ejection fraction (HCC)    DOE (dyspnea on exertion) 11/07/2020   Diabetes ([place]) 03/18/2020   Prolapsed lumbar disc 01/25/2020   Closed fracture of lateral malleolus 01/25/2020   Hiatal hernia    Hepatic steatosis    Hemianopia, homonymous, right    ETOH abuse    Esophagitis    Chronic diastolic CHF (congestive heart failure) (HCC)    Chronic bronchitis (HCC)    CAD (coronary  artery disease)    PAD (peripheral artery disease) ([place]) 09/23/2016   Morbid obesity ([place])    Chronic pain syndrome 10/23/2015   GERD (gastroesophageal reflux disease) 08/07/2015   Pancreatitis, acute    PCP NOTES >>>>> 06/01/2015   Annual physical exam 05/31/2015   Acute ischemic stroke ([place]) 02/20/2015   Hx of cocaine abuse ([place]) 02/20/2015   BPH (benign prostatic hyperplasia) 02/20/2015   OSA (obstructive sleep apnea) ?? 05/05/2013   Noncompliance 05/05/2013   Anxiety and depression 05/05/2013   Erectile dysfunction 03/17/2012   Emphysema (subcutaneous) (surgical) resulting from a procedure 10/08/2008   Iron deficiency anemia 08/10/2008   ABNORMALITY OF GAIT 08/06/2008    Cigarette smoker 07/27/2008   Dyslipidemia 07/26/2008   Essential hypertension 07/26/2008   ST elevation myocardial infarction involving left circumflex coronary artery ([place]) 07/26/2008    Past Surgical History:  Procedure Laterality Date   ABDOMINAL AORTOGRAM W/LOWER EXTREMITY N/A 09/23/2016   Procedure: Abdominal Aortogram w/Lower Extremity;  Surgeon: Wellington Hampshire, MD;  Location: [place] [facility] LAB;  Service: Cardiovascular;  Laterality: N/A;   CARDIAC CATHETERIZATION  11/07/2008   EF of 50-55% -- normal LV systolic function, acute inferolateral ST elevation MI,  PTCA of the circumflex artery -- [name]. [name], [name]   CARDIAC CATHETERIZATION N/A 07/07/2016   Procedure: Left Heart Cath and Cors/Grafts Angiography;  Surgeon: [name], MD;  Location: [place] [facility] LAB;  Service: Cardiovascular;  Laterality: N/A;   CARDIAC CATHETERIZATION N/A 07/07/2016   Procedure: Coronary Stent Intervention;  Surgeon: [name], MD;  Location: [place] [facility];  Service: Cardiovascular;  Laterality: N/A;   CORONARY ANGIOPLASTY     CORONARY ARTERY BYPASS GRAFT     "CABG X4"   LEFT HEART CATH AND CORS/GRAFTS ANGIOGRAPHY N/A 10/01/2017   Procedure: LEFT HEART CATH AND CORS/GRAFTS ANGIOGRAPHY;  Surgeon: Burnell Blanks, MD;  Location: [place] CV LAB;  Service: Cardiovascular;  Laterality: N/A;   LEFT HEART CATHETERIZATION WITH CORONARY/GRAFT ANGIOGRAM N/A 05/15/2013   Procedure: LEFT HEART CATHETERIZATION WITH Beatrix Fetters;  Surgeon: [name], MD;  Location[facility] CATH LAB;  Service: Cardiovascular;  Laterality: N/A;   PERIPHERAL VASCULAR INTERVENTION  09/23/2016   Procedure: Peripheral Vascular Intervention;  Surgeon: Wellington Hampshire, MD;  Location: [place] CV LAB;  Service: Cardiovascular;;  Lt. SFA       Family History  Problem Relation Age of Onset   Heart disease Mother    Heart failure Mother    Diabetes Mother    Colon cancer Father        late  36s   Prostate cancer Neg Hx     Social History   Tobacco Use   Smoking status: Every Day    Packs/day: 0.50    Years: 45.00    Pack years: 22.50    Types: Cigarettes    Start date: 1976   Smokeless tobacco: Never   Tobacco comments:    1 pack daily-03/10/21  Vaping Use   Vaping Use: Never used  Substance Use Topics   Alcohol use: Yes    Alcohol/week: 14.0 standard drinks    Types: 14 Cans of beer per week    Comment: as off 12/2017: beer 12-pack q week   Drug use: No    Comment: 09/23/2016 "none in years"    Home Medications Prior to Admission medications   Medication Sig Start Date End Date Taking? Authorizing Provider  aspirin EC 81 MG EC tablet Take 1  tablet (81 mg total) by mouth daily with breakfast. 11/28/18   Roxan Hockey, MD  atorvastatin (LIPITOR) 80 MG tablet Take 1 tablet (80 mg total) by mouth daily at 6 PM. 03/08/21   Burnell Blanks, MD  budesonide-formoterol [facility]) 160-4.5 MCG/ACT inhaler Inhale 2 puffs into the lungs 2 (two) times daily. Patient not taking: Reported on 04/03/2021 03/10/21   Colon Branch, MD  clopidogrel (PLAVIX) 75 MG tablet TAKE 1 TABLET (75 MG TOTAL) BY MOUTH DAILY. 11/27/20   Burnell Blanks, MD  fenofibrate (TRICOR) 145 MG tablet Take 1 tablet (145 mg total) by mouth daily. Patient not taking: Reported on 04/03/2021 03/10/21   Colon Branch, MD  folic acid (FOLVITE) 1 MG tablet Take 1 tablet (1 mg total) by mouth daily. 11/28/18   Roxan Hockey, MD  furosemide (LASIX) 20 MG tablet Take 3 tablets (60 mg total) by mouth daily. 03/18/21   Burnell Blanks, MD  HYDROcodone-acetaminophen (NORCO/VICODIN) 5-325 MG tablet Take 1 tablet by mouth every 6 (six) hours as needed for moderate pain. 04/11/21   Fredia Sorrow, MD  isosorbide mononitrate (IMDUR) 60 MG 24 hr tablet Take 1.5 tablets (90 mg total) by mouth daily. 03/08/21   Burnell Blanks, MD  metoprolol tartrate (LOPRESSOR) 25 MG tablet Take 1 tablet (25 mg total)  by mouth 2 (two) times daily. 03/08/21   Burnell Blanks, MD  nitroGLYCERIN (NITROSTAT) 0.4 MG SL tablet Place 1 tablet (0.4 mg total) under the tongue every 5 (five) minutes x 3 doses as needed for chest pain. 03/10/21   Colon Branch, MD  pantoprazole (PROTONIX) 40 MG tablet TAKE 1 TABLET TWICE DAILY 11/26/20   Burnell Blanks, MD  spironolactone (ALDACTONE) 25 MG tablet Take 0.5 tablets (12.5 mg total) by mouth daily. 03/18/21   Burnell Blanks, MD  tamsulosin (FLOMAX) 0.4 MG CAPS capsule Take 0.4 mg by mouth at bedtime. 12/17/20   [provider]  thiamine 100 MG tablet Take 1 tablet (100 mg total) by mouth daily. 11/29/18   Roxan Hockey, MD  traZODone (DESYREL) 50 MG tablet Take 1 tablet (50 mg total) by mouth at bedtime. Patient taking differently: Take 100 mg by mouth at bedtime. 03/10/21   Colon Branch, MD  valsartan (DIOVAN) 80 MG tablet Take 1 tablet (80 mg total) by mouth daily. 03/08/21   Burnell Blanks, MD    Allergies    Patient has no known allergies.  Review of Systems   Review of Systems  Constitutional:  Negative for chills and fever.  HENT:  Negative for rhinorrhea and sore throat.   Eyes:  Negative for visual disturbance.  Respiratory:  Negative for cough and shortness of breath.   Cardiovascular:  Negative for chest pain and leg swelling.  Gastrointestinal:  Positive for abdominal pain. Negative for diarrhea, nausea and vomiting.  Genitourinary:  Negative for dysuria.  Musculoskeletal:  Negative for back pain and neck pain.  Skin:  Negative for rash.  Neurological:  Negative for dizziness, light-headedness and headaches.  Hematological:  Does not bruise/bleed easily.  Psychiatric/Behavioral:  Negative for confusion.    Physical Exam Updated Vital Signs BP 135/78 (BP Location: Right Arm)   Pulse 65   Temp 98.1 F (36.7 C) (Oral)   Resp 15   Ht 1.753 m ('5\' 9"'$ )   Wt 100.6 kg   SpO2 94%   BMI 32.75 kg/m   Physical Exam Vitals  and nursing note reviewed.  Constitutional:  General: He is not in acute distress.    Appearance: Normal appearance. He is well-developed.  HENT:     Head: Normocephalic and atraumatic.  Eyes:     Extraocular Movements: Extraocular movements intact.     Conjunctiva/sclera: Conjunctivae normal.     Pupils: Pupils are equal, round, and reactive to light.  Cardiovascular:     Rate and Rhythm: Normal rate and regular rhythm.     Heart sounds: No murmur heard. Pulmonary:     Effort: Pulmonary effort is normal. No respiratory distress.     Breath sounds: Normal breath sounds.  Abdominal:     Palpations: Abdomen is soft.     Tenderness: There is abdominal tenderness.     Comments: Marked tenderness to the right upper quadrant area but a little bit below the gallbladder area.  No mass.  Musculoskeletal:        General: Normal range of motion.     Cervical back: Normal range of motion and neck supple.  Skin:    General: Skin is warm and dry.     Capillary Refill: Capillary refill takes less than 2 seconds.  Neurological:     General: No focal deficit present.     Mental Status: He is alert and oriented to person, place, and time.     Cranial Nerves: No cranial nerve deficit.     Sensory: No sensory deficit.     Motor: No weakness.    ED Results / Procedures / Treatments   Labs (all labs ordered are listed, but only abnormal results are displayed) Labs Reviewed  CBC WITH DIFFERENTIAL/PLATELET - Abnormal; Notable for the following components:      Result Value   WBC 11.0 (*)    RDW 23.4 (*)    Neutro Abs 8.8 (*)    All other components within normal limits  COMPREHENSIVE METABOLIC PANEL - Abnormal; Notable for the following components:   Glucose, Bld 125 (*)    Total Bilirubin 1.5 (*)    All other components within normal limits  LIPASE, BLOOD    EKG None  Radiology CT Abdomen Pelvis W Contrast  Result Date: 04/11/2021 CLINICAL DATA:  Abdominal pain. Progressive right  upper quadrant pain for 3 days. EXAM: CT ABDOMEN AND PELVIS WITH CONTRAST TECHNIQUE: Multidetector CT imaging of the abdomen and pelvis was performed using the standard protocol following bolus administration of intravenous contrast. CONTRAST:  22m OMNIPAQUE IOHEXOL 350 MG/ML SOLN COMPARISON:  80 cc of Omnipaque 350 FINDINGS: Lower chest: Dependent changes identified within the lung bases. Hepatobiliary: No focal liver abnormality. Gallbladder appears normal. No bile duct dilatation. Pancreas: Unremarkable. No pancreatic ductal dilatation or surrounding inflammatory changes. Spleen: Normal in size without focal abnormality. Adrenals/Urinary Tract: Normal adrenal glands. Bilateral renal calcifications are likely vascular in etiology. No mass or hydronephrosis identified. Urinary bladder appears normal. Stomach/Bowel: Moderate to large hiatal hernia is identified, similar to previous exam. The appendix is visualized and appears normal. No bowel wall thickening, inflammation, or distension. Vascular/Lymphatic: Extensive aortic atherosclerosis with branch vessel involvement. Infrarenal abdominal aorta measures 3 cm, image 51/3. No abdominopelvic adenopathy. Reproductive: Prostate is unremarkable. Other: There is a fat containing umbilical hernia which measures 3.6 cm, image 121/7. Similar to the previous exam. Small soft tissue nodule adjacent to the inferior margin of the posterior right hepatic lobe measures 1 cm. This has been present since at least 08/06/2015 compatible with a benign abnormality. No free fluid or fluid collections identified. Musculoskeletal: No acute or significant osseous  findings. IMPRESSION: 1. No acute findings identified within the abdomen or pelvis. 2. Moderate to large hiatal hernia. 3. Fat containing umbilical hernia. 4. Aortic atherosclerosis. Aortic Atherosclerosis (ICD10-I70.0). Electronically Signed   By: Kerby Moors [name]   On: 04/11/2021 13:02    Procedures Procedures    Medications Ordered in ED Medications  0.9 %  sodium chloride infusion ( Intravenous Stopped 04/11/21 1534)  ondansetron (ZOFRAN) injection 4 mg (4 mg Intravenous Given 04/11/21 1104)  HYDROmorphone (DILAUDID) injection 1 mg (1 mg Intravenous Given 04/11/21 1104)  iohexol (OMNIPAQUE) 350 MG/ML injection 80 mL (80 mLs Intravenous Contrast Given 04/11/21 1221)    ED Course  I have reviewed the triage vital signs and the nursing notes.  Pertinent labs & imaging results that were available during my care of the patient were reviewed by me and considered in my medical decision making (see chart for details).    MDM Rules/Calculators/A&P                          Work-up without any acute findings.  Mild leukocytosis white blood cell count 11,000.  Liver function tests only significant for bilirubin of 1.5.  Lipase is normal at 30.  CT scan had no acute findings.  Clinically symptoms seem to be more abdominal wall with tenderness.  There was no evidence of abdominal wall hernia.  Will treat patient with pain medicine and have him follow-up with his primary care doctor.  Clinically also based on the location think it is unlikely that it is peptic ulcer.  Certainly CT showed no acute process.   Final Clinical Impression(s) / ED Diagnoses Final diagnoses:  Abdominal wall pain    Rx / DC Orders ED Discharge Orders          Ordered    HYDROcodone-acetaminophen (NORCO/VICODIN) 5-325 MG tablet  Every 6 hours PRN,   Status:  Discontinued        04/11/21 1541    HYDROcodone-acetaminophen (NORCO/VICODIN) 5-325 MG tablet  Every 6 hours PRN,   Status:  Discontinued        04/11/21 1542    HYDROcodone-acetaminophen (NORCO/VICODIN) 5-325 MG tablet  Every 6 hours PRN        04/11/21 1550             Fredia Sorrow, MD 04/11/21 1612

## 2021-04-11 NOTE — Telephone Encounter (Signed)
Received call from Mr. Diantonio sister Alyse Low.  Mr. Nason has had abdominal for 3 days with a significant increase in pain that started at 2am today. He has not been able to sleep.  Has a history of pancreatitis. Discussed with PCP, recommended patient go to ED for evaluation.  PCP's recommendation discussed with Mr. Ripple sister and she voiced understanding. She will get patient to go to ED.

## 2021-04-11 NOTE — ED Triage Notes (Signed)
Pt bib [facility] from home with complaints of RUQ pain x3 days but got worse this am.  EMS vitals: 130/80 BP, 84 HR, 90% RA, 122 CBG Pt does not wear his oxygen at home

## 2021-04-18 ENCOUNTER — Telehealth: Payer: Self-pay | Admitting: Internal Medicine

## 2021-04-18 NOTE — Telephone Encounter (Signed)
   Telephone encounter was:  Unsuccessful.  04/18/2021 Name: [name]. MRN: [id number] DOB: 1961/02/18  Unsuccessful outbound call made today to assist with:  Transportation Needs   Outreach Attempt:  1st Attempt  A HIPAA compliant voice message was left requesting a return call.  Instructed patient to call back at [phone].  April Green Care Guide, Embedded Care Coordination [place], Care Management Phone: [phone] Email: [email or web address]

## 2021-04-22 ENCOUNTER — Ambulatory Visit (INDEPENDENT_AMBULATORY_CARE_PROVIDER_SITE_OTHER): Payer: Medicare HMO

## 2021-04-22 DIAGNOSIS — I5032 Chronic diastolic (congestive) heart failure: Secondary | ICD-10-CM

## 2021-04-22 DIAGNOSIS — I10 Essential (primary) hypertension: Secondary | ICD-10-CM

## 2021-04-22 NOTE — Patient Instructions (Signed)
Visit Information  PATIENT GOALS:  Goals Addressed             This Visit's Progress    Track and Manage my Chronic Health Conditions   On track    Timeframe:  Long-Range Goal Priority:  High Start Date:  04/07/21                           Expected End Date:  10/07/21                     Follow Up Date 06/03/21  Patient Goals: - contact: Oval Linsey Social Worker for the [facility] [place]) [phone] for available resources - contact: [facility] [place] [facility] [phone] for available resources - continue to take medications as prescribed - continue to attend provider visits as scheduled - develop a rescue plan and follow a rescue plan for exacerbation of Heart Failure if symptoms flare-up. - know when to call the doctor. signs/symptoms of worsening: weight gain of 3 pounds overnight, increased swelling in stomach, feet/legs or hands. Increased shortness of breath, having less energy than normal, new or worsening dizziness, if it is harder for you to breathe when lying down and you need to sit up to breathe or you feel that something is not right.  - continue to eat healthy: whole grains, fruits and vegetables, lean meats and healthy fats. Monitor salt intake. - track symptoms and what helps feel better or worse - begin to weigh and record weights when your scales arrive. -review education on weights and plan to discuss at next telephone call.       Heart Failure, Self-Care Heart failure is a serious condition. The following information explains things you need to do to take care of yourself at home. To help you stay as healthy as possible, you may be asked to change your diet, take certain medicines, and make other changes in your life. Your doctor may also give you more specific instructions. If you have problems or questions, call your doctor. What are the risks? Having heart failure makes it more likely for you to have some problems. These problems can get worse if  you do not take good care of yourself. Problems may include: Damage to the kidneys, liver, or lungs. Malnutrition. Abnormal heart rhythms. Blood clotting problems that could cause a stroke. Supplies needed: Scale for weighing yourself. Blood pressure monitor. Notebook. Medicines. How to care for yourself when you have heart failure Medicines Take over-the-counter and prescription medicines only as told by your doctor. Take your medicines every day. Do not stop taking your medicine unless your doctor tells you to do so. Do not skip any medicines. Get your prescriptions refilled before you run out of medicine. This is important. Talk with your doctor if you cannot afford your medicines. Eating and drinking  Eat heart-healthy foods. Talk with a diet specialist (dietitian) to create an eating plan. Limit salt (sodium) if told by your doctor. Ask your diet specialist to tell you which seasonings are healthy for your heart. Cook in healthy ways instead of frying. Healthy ways of cooking include roasting, grilling, broiling, baking, poaching, steaming, and stir-frying. Choose foods that: Have no trans fat. Are low in saturated fat and cholesterol. Choose healthy foods, such as: Fresh or frozen fruits and vegetables. Fish. Low-fat (lean) meats. Legumes, such as beans, peas, and lentils. Fat-free or low-fat dairy products. Whole-grain foods. High-fiber foods. Limit how much fluid  you drink, if told by your doctor. Alcohol use Do not drink alcohol if: Your doctor tells you not to drink. Your heart was damaged by alcohol, or you have very bad heart failure. You are pregnant, may be pregnant, or are planning to become pregnant. If you drink alcohol: Limit how much you have to: 0-1 drink a day for women. 0-2 drinks a day for men. Know how much alcohol is in your drink. In the [place] one drink equals one 12 oz bottle of beer (355 mL), one 5 oz glass of wine (148 mL), or one 1 oz glass of  hard liquor (44 mL). Lifestyle  Do not smoke or use any products that contain nicotine or tobacco. If you need help quitting, ask your doctor. Do not use nicotine gum or patches before talking to your doctor. Do not use illegal drugs. Lose weight if told by your doctor. Do physical activity if told by your doctor. Talk to your doctor before you begin an exercise if: You are an older adult. You have very bad heart failure. Learn to manage stress. If you need help, ask your doctor. Get physical rehab (rehabilitation) to help you stay independent and to help with your quality of life. Participate in a cardiac rehab program. This program helps you improve your health through exercise, education, and counseling. Plan time to rest when you get tired. Check weight and blood pressure  Weigh yourself every day. This will help you to know if fluid is building up in your body. Weigh yourself every morning after you pee (urinate) and before you eat breakfast. Wear the same amount of clothing each time. Write down your daily weight. Give your record to your doctor. Check and write down your blood pressure as told by your doctor. Check your pulse as told by your doctor. Dealing with very hot and very cold weather If it is very hot: Avoid activities that take a lot of energy. Use air conditioning or fans, or find a cooler place. Avoid caffeine and alcohol. Wear clothing that is loose-fitting, lightweight, and light-colored. If it is very cold: Avoid activities that take a lot of energy. Layer your clothes. Wear mittens or gloves, a hat, and a face covering when you go outside. Avoid alcohol. Follow these instructions at home: Stay up to date with shots (vaccines). Get pneumococcal and flu (influenza) shots. Keep all follow-up visits. Contact a doctor if: You gain 2-3 lb (1-1.4 kg) in 24 hours or 5 lb (2.3 kg) in a week. You have increasing shortness of breath. You cannot do your normal  activities. You get tired easily. You cough a lot. You do not feel like eating or feel like you may vomit (nauseous). You have swelling in your hands, feet, ankles, or belly (abdomen). You cannot sleep well because it is hard to breathe. You feel like your heart is beating fast (palpitations). You get dizzy when you stand up. You feel depressed or sad. Get help right away if: You have trouble breathing. You or someone else notices a change in your behavior, such as having trouble staying awake. You have chest pain or discomfort. You pass out (faint). These symptoms may be an emergency. Get help right away. Call your local emergency services (911 in the [place] Do not wait to see if the symptoms will go away. Do not drive yourself to the hospital. Summary Heart failure is a serious condition. To care for yourself, you may have to change your diet, take medicines,  and make other lifestyle changes. Take your medicines every day. Do not stop taking them unless your doctor tells you to do so. Limit salt and eat heart-healthy foods. Ask your doctor if you can drink alcohol. You may have to stop alcohol use if you have very bad heart failure. Contact your doctor if you gain weight quickly or feel that your heart is beating too fast. Get help right away if you pass out or have chest pain or trouble breathing. This information is not intended to replace advice given to you by your health care provider. Make sure you discuss any questions you have with your health care provider. Document Revised: 02/17/2020 Document Reviewed: 02/17/2020 Elsevier Patient Education  2022 [place].   Patient verbalizes understanding of instructions provided today and agrees to view in [place].   Telephone follow up appointment with care management team member scheduled for: The patient has been provided with contact information for the care management team and has been advised to call with any health related  questions or concerns.   Thea Silversmith, RN, MSN, BSN, CCM Care Management Coordinator [facility] [phone]

## 2021-04-22 NOTE — Chronic Care Management (AMB) (Addendum)
Chronic Care Management   CCM RN Visit Note  04/22/2021 Name: [name]. MRN: [id number] DOB: [birth year]  Subjective: [name]. is a 60 y.o. year old male who is a primary care patient of Colon Branch, MD. The care management team was consulted for assistance with disease management and care coordination needs.    Engaged with patient by telephone for follow up visit in response to provider referral for case management and/or care coordination services.   Consent to Services:  The patient was given information about Chronic Care Management services, agreed to services, and gave verbal consent prior to initiation of services.  Please see initial visit note for detailed documentation.   Patient agreed to services and verbal consent obtained.   Assessment: Review of patient past medical history, allergies, medications, health status, including review of consultants reports, laboratory and other test data, was performed as part of comprehensive evaluation and provision of chronic care management services.   SDOH (Social Determinants of Health) assessments and interventions performed:    CCM Care Plan  No Known Allergies  Outpatient Encounter Medications as of 04/22/2021  Medication Sig   aspirin EC 81 MG EC tablet Take 1 tablet (81 mg total) by mouth daily with breakfast.   atorvastatin (LIPITOR) 80 MG tablet Take 1 tablet (80 mg total) by mouth daily at 6 PM.   budesonide-formoterol (SYMBICORT) 160-4.5 MCG/ACT inhaler Inhale 2 puffs into the lungs 2 (two) times daily. (Patient not taking: Reported on 04/03/2021)   clopidogrel (PLAVIX) 75 MG tablet TAKE 1 TABLET (75 MG TOTAL) BY MOUTH DAILY.   fenofibrate (TRICOR) 145 MG tablet Take 1 tablet (145 mg total) by mouth daily. (Patient not taking: Reported on 3/54/6568)   folic acid (FOLVITE) 1 MG tablet Take 1 tablet (1 mg total) by mouth daily.   furosemide (LASIX) 20 MG tablet Take 3 tablets (60 mg total) by mouth daily.    HYDROcodone-acetaminophen (NORCO/VICODIN) 5-325 MG tablet Take 1 tablet by mouth every 6 (six) hours as needed for moderate pain.   isosorbide mononitrate (IMDUR) 60 MG 24 hr tablet Take 1.5 tablets (90 mg total) by mouth daily.   metoprolol tartrate (LOPRESSOR) 25 MG tablet Take 1 tablet (25 mg total) by mouth 2 (two) times daily.   nitroGLYCERIN (NITROSTAT) 0.4 MG SL tablet Place 1 tablet (0.4 mg total) under the tongue every 5 (five) minutes x 3 doses as needed for chest pain.   pantoprazole (PROTONIX) 40 MG tablet TAKE 1 TABLET TWICE DAILY   spironolactone (ALDACTONE) 25 MG tablet Take 0.5 tablets (12.5 mg total) by mouth daily.   tamsulosin (FLOMAX) 0.4 MG CAPS capsule Take 0.4 mg by mouth at bedtime.   thiamine 100 MG tablet Take 1 tablet (100 mg total) by mouth daily.   traZODone (DESYREL) 50 MG tablet Take 1 tablet (50 mg total) by mouth at bedtime. (Patient taking differently: Take 100 mg by mouth at bedtime.)   valsartan (DIOVAN) 80 MG tablet Take 1 tablet (80 mg total) by mouth daily.   No facility-administered encounter medications on file as of 04/22/2021.    Patient Active Problem List   Diagnosis Date Noted   COPD 0 AB 03/31/2021   (HFpEF) heart failure with preserved ejection fraction (HCC)    DOE (dyspnea on exertion) 11/07/2020   Diabetes ([place]) 03/18/2020   Prolapsed lumbar disc 01/25/2020   Closed fracture of lateral malleolus 01/25/2020   Hiatal hernia    Hepatic steatosis    Hemianopia,  homonymous, right    ETOH abuse    Esophagitis    Chronic diastolic CHF (congestive heart failure) (HCC)    Chronic bronchitis (HCC)    CAD (coronary artery disease)    PAD (peripheral artery disease) ([place]) 09/23/2016   Morbid obesity ([place])    Chronic pain syndrome 10/23/2015   GERD (gastroesophageal reflux disease) 08/07/2015   Pancreatitis, acute    PCP NOTES >>>>> 06/01/2015   Annual physical exam 05/31/2015   Acute ischemic stroke ([place]) 02/20/2015   Hx of cocaine abuse  ([place]) 02/20/2015   BPH (benign prostatic hyperplasia) 02/20/2015   OSA (obstructive sleep apnea) ?? 05/05/2013   Noncompliance 05/05/2013   Anxiety and depression 05/05/2013   Erectile dysfunction 03/17/2012   Emphysema (subcutaneous) (surgical) resulting from a procedure 10/08/2008   Iron deficiency anemia 08/10/2008   ABNORMALITY OF GAIT 08/06/2008   Cigarette smoker 07/27/2008   Dyslipidemia 07/26/2008   Essential hypertension 07/26/2008   ST elevation myocardial infarction involving left circumflex coronary artery ([place]) 07/26/2008    Conditions to be addressed/monitored:CHF, HTN, HLD, COPD, and DMII  Care Plan : Cardiovascular disease  Updates made by Luretha Rued, RN since 04/22/2021 12:00 AM     Problem: Knowledge deficit related to long term care plan for self-managment of cardiovascular disease in a patient with CHF, HTN, CAD, DM, COPD, current smoker   Priority: High     Long-Range Goal: Monitor and Manage chronic disease   Start Date: 04/07/2021  Expected End Date: 10/07/2021  This Visit's Progress: On track  Priority: High  Note:   Objective:  Lab Results  Component Value Date   HGBA1C 7.0 (H) 07/10/2020   Lab Results  Component Value Date   CREATININE 1.02 04/11/2021   CREATININE 1.02 02/19/2021   CREATININE 0.94 02/12/2021   Lab Results  Component Value Date   EGFR 85 02/19/2021  Objective:  Last practice recorded BP readings:  BP Readings from Last 3 Encounters:  04/11/21 135/78  03/31/21 118/66  03/10/21 124/72  Current Barriers: Knowledge Deficits related to long term care plan for self-management of cardiovascular disease in a patient with CHF, HTN, COPD, DM. Client reports he is doing well. Denies any questions or concern at this time.  Unable to independently manage medications Does not attend all scheduled provider appointments Vision impairment-legally blind Does not have a readable scale Transportation barriers Case Manager Clinical  Goal(s):  patient will verbalize understanding of plan for self-management of cardiovascular, pulmonary and diabetes.  patient will obtain scale and begin to weigh and record weights patient will verbalize understanding of Heart Failure Action Plan and when to call doctor patient will take all mediations as prescribed Interventions:  Collaboration with Colon Branch, MD regarding development and update of comprehensive plan of care as evidenced by provider attestation and co-signature Inter-disciplinary care team collaboration (see longitudinal plan of care) Evaluation of current treatment plan related to chronic disease self management and patient's adherence to plan as established by provider. Discussed medications with patient. Upcoming appointment with clinical pharmacist scheduled for 03/24/21. Encouraged patient to take medications as prescribed. Reinforced the importance of daily weight. Reinforced how to get the most accurate weights. Patient reports scales are due to arrive today. Reinforced signs/symptoms of HF exacerbation and when to call the doctor. Reviewed meal plan and encouraged health eating plan, low salt diet.  Reviewed upcoming provider appointments. Discussed local resource: services for the Blind. Discussed plans with patient for ongoing care management follow up and provided patient  with direct contact information for care management team. Patient Goals: - contact: Camera operator for the [facility]) [phone] for available resources - contact: [place] [facility] [phone] for available resources - continue to take medications as prescribed - continue to attend provider visits as scheduled - develop a rescue plan and follow a rescue plan for exacerbation of Heart Failure if symptoms flare-up. - know when to call the doctor. signs/symptoms of worsening: weight gain of 3 pounds overnight, increased swelling in stomach, feet/legs or  hands. Increased shortness of breath, having less energy than normal, new or worsening dizziness, if it is harder for you to breathe when lying down and you need to sit up to breathe or you feel that something is not right.  - continue to eat healthy: whole grains, fruits and vegetables, lean meats and healthy fats. Monitor salt intake. - track symptoms and what helps feel better or worse - begin to weigh and record weights when your scales arrive. -review education on weights and plan to discuss at next telephone call. Follow Up Plan: The patient has been provided with contact information for the care management team and has been advised to call with any health related questions or concerns.  The care management team will reach out to the patient again over the next 30 days.      Plan:The patient has been provided with contact information for the care management team and has been advised to call with any health related questions or concerns.  and The care management team will reach out to the patient again next month.  Thea Silversmith, RN, MSN, BSN, CCM Care Management Coordinator [facility] [phone]   I have reviewed and agree with [facility] documentation.  [name], MD

## 2021-04-24 ENCOUNTER — Ambulatory Visit: Payer: Medicare HMO | Admitting: Pharmacist

## 2021-04-24 DIAGNOSIS — E1151 Type 2 diabetes mellitus with diabetic peripheral angiopathy without gangrene: Secondary | ICD-10-CM

## 2021-04-24 DIAGNOSIS — I25118 Atherosclerotic heart disease of native coronary artery with other forms of angina pectoris: Secondary | ICD-10-CM

## 2021-04-24 DIAGNOSIS — I5032 Chronic diastolic (congestive) heart failure: Secondary | ICD-10-CM

## 2021-04-24 DIAGNOSIS — I10 Essential (primary) hypertension: Secondary | ICD-10-CM

## 2021-04-24 DIAGNOSIS — F17200 Nicotine dependence, unspecified, uncomplicated: Secondary | ICD-10-CM

## 2021-04-24 NOTE — Patient Instructions (Signed)
Visit Information  PATIENT GOALS:  Goals Addressed             This Visit's Progress    Pharmacy Chronic Care Management Plan   On track    Current Barriers:  Unable to independently monitor therapeutic efficacy Unable to achieve control of Hyperlipidemia / high cholesterol Unable to maintain control of blood pressure and heart disease Does not adhere to prescribed medication regimen Does not contact provider office for questions/concerns  Pharmacist Clinical Goal(s):  Over the next 90 days, patient will verbalize ability to afford treatment regimen achieve adherence to monitoring guidelines and medication adherence to achieve therapeutic efficacy achieve control of Hyperlipidemia as evidenced by LDL <70 maintain control of Heart disease, blood pressure and type 2 diabetes as evidenced by attaining goals listed below  adhere to prescribed medication regimen as evidenced by refill history contact provider office for questions/concerns as evidenced notation of same in electronic health record through collaboration with PharmD and provider.   Interventions: 1:1 collaboration with Colon Branch, MD regarding development and update of comprehensive plan of care as evidenced by provider attestation and co-signature Inter-disciplinary care team collaboration (see longitudinal plan of care) Comprehensive medication review performed; medication list updated in electronic medical record  Diabetes: Uncontrolled; Goal A1c < 6.5% Last A1c was 7.0% (07/10/2020) Current treatment:  None Interventions:   Counseled on A1c goal Reviewed home blood goals  Fasting blood glucose goal (before meals) = 80 to 130 Blood glucose goal after a meal = less than 180  Recommend recheck A1c with next labs. If >6.5% consider starting medication therapy  Hypertension / Heart Disease:  Goal: decreased symptoms of heart disease, prevent exacerbations and BP <140/90.  Current treatment: Furosemide '20mg'$  - take  3 tablets = '60mg'$  daily in the morning Metoprolol tartrate '25mg'$  twice a day Spironolactone '25mg'$  - take 0.5 tablet = 12.'5mg'$  each morning Valsartan '80mg'$  daily Interventions:   Reviewed blood pressure goal Continue to check blood pressure 2 to 3 times a week, record and bring to future appointments. Discussed signs and symptoms of worsening heart disease weight gain, shortness of breath, abdominal fullness, swelling in legs or abdomen, Fatigue and weakness, changes in ability to perform usual activities, persistent cough or wheezing with white or pink blood-tinged mucus, nausea and lack of appetite. Contact cardiologist or primary care physician if you experience any of these symptoms.  Once you get scales - check weight every day. Report weight gain of more than 3 lbs in 24 hours or 5 lbs in 1 week.   Hyperlipidemia with elevated triglycerides / history of stroke: LDL goal <70; Triglycerides goal <150 Last LDL was 116 and Triglycerides were 97 Current treatment: Atorvastatin '80mg'$  daily Fenofibrate '145mg'$  daily Aspirin '81mg'$  daily with breakfast (has not taken recently Clopidogrel '75mg'$  daily Isosorbide ER '60mg'$  0 take 1.5 tablets = '90mg'$  daily Nitroglycerin 0.'4mg'$  - place 1 tablet under the tongue and allow to dissolve as needed for chest pain Interventions:  Education provided about LDL and triglyceride goal Discussed benefits of getting LDL to goal and taking atorvastatin daily for worsening of heart disease / stroke Recommend recheck lipid panel with next labs.  Continue current medications listed above for heart health / cholesterol lowering / stroke prevention   Chronic Obstructive Pulmonary Disease / sleep apnea: Current treatment: CPAP Symbicort (budesonide/formoterol) 160/4.5 - inhale 2 puffs into lungs twice a day Interventions:  Continue to follow up with pulmonologist  Use Symbicort - 2 puffs twice a day Remember to  rinse mouth after using Symbicort.  Discontinue smoking or at  least decrease number of cigarettes smoked per day.  I am checking with Dr [name] about starting bupropion to help you quit smoking.   Medication Management:  Goal: take medications as prescribed; consult clinical pharmacist if any questions or issues with medication access Current Pharmacy:  locally uses [facility].  [facility] Interventions:  Discussed using either color stickers or color pill containers to help patient identify morning, afternoon, evening and night medications Reviewed each medication with patient and discussed how to take and reason for taking.   Patient Goals/Self-Care Activities Over the next 90 days, patient will:  Take medications as prescribed, focus on medication adherence by utilizing mail order pharmacy and contacting clinical pharmacist about any medications questions or concerns Check blood pressure 2 to 3 times per week, document, and provide at future appointments,  Weigh daily, and contact provider if weight gain of more than 3 lbs in 24 hours or 5 lbs in 1 week Collaborate with provider on medication access solutions We applies for Extra Assistance from Medicare today - please let me know when you receive a letter with either approval or denial.   Follow Up Plan: Telephone follow up appointment with care management team member scheduled for 1 month         The patient verbalized understanding of instructions, educational materials, and care plan provided today and declined offer to receive copy of patient instructions, educational materials, and care plan.   Telephone follow up appointment with care management team member scheduled for: 3 to 4 weeks  .tbes

## 2021-04-24 NOTE — Chronic Care Management (AMB) (Signed)
//   Chronic Care Management Pharmacy Note  04/24/2021 Name:  [name]. MRN:  [id number] DOB:  1961-04-23  Summary:  Assisted patient in applying for lower income subsidy / extra assistance for Medicare Patient restarted smoking but is interested in trying to quit - has tried Chantix, patches and cold [place] in past.   Subjective: [name]. is an 60 y.o. year old male who is a primary patient of [name], [name], MD.  The CCM team was consulted for assistance with disease management and care coordination needs.   Recommendations:  Consider trial of bupropion for smoking cessation - message sent to PCP to see if [facility] to start.  Start checking weight once scales arrive.  Restart Symbicort two puffs twice a day  Plan:  F/U with PCP 05/07/2021 as planned Clinical pharmacist will call patient again in 4 weeks.    Engaged with patient by telephone for follow up visit in response to provider referral for pharmacy case management and/or care coordination services.   Consent to Services:  The patient was given information about Chronic Care Management services, agreed to services, and gave verbal consent prior to initiation of services.  Please see initial visit note for detailed documentation.   Patient Care Team: Colon Branch, MD as PCP - General (Internal Medicine) Burnell Blanks, MD as PCP - Cardiology (Cardiology) Ralene Bathe, MD as Consulting Physician (Ophthalmology) Pyrtle, Lajuan Lines, MD as Consulting Physician (Gastroenterology) Susa Day, MD as Consulting Physician (Orthopedic Surgery) Luretha Rued, RN as Case Manager Cherre Robins, PharmD (Pharmacist) Rozetta Nunnery, MD as Consulting Physician (Otolaryngology)  Recent office visits: 10/22/2020 - PCP (Dr [name]) - F/U chronic conditions. Removed Repatha from med list as pt reported no longer taking due to cost. Patient also was not taking metformin - new Rx sent. Recommended restart  trazodone for depression / insomnia. Referral for outpatient speach therapy.   Recent consult visits: 03/31/2021 - Pulmonology (Dr Melvyn Novas) F/U COPD. No medication changes.  PFT's  03/31/2021  FEV1 1.93 (53 % ) ratio 0.70  p 13 % improvement from saba p 0 prior to study with   FV curve no signficant curvature. 03/10/2021 - Pulm Elie Confer, NP) Phone Visit - Lung Cancer Screeing education session. CT scheduled.  01/28/2021 - Pulm (Parrett, NP) F/U cough OSA and SOB. Suspects underlying COPD - ordered PFTs. Recommended restart CPAP. Ordered DME to change CPAP to Auto set 10-20. 01/28/2021 Cardio ([place], [facility]) F/U CHFpEF. Stopped potassium supplement; Started spironolacton 88m - take 0.5 tablet daily. Consider SGLT2 or Entresto in future.   Hospital visits: 04/11/2021 - ED for abdominal pain - no medication changes noted.  Objective:  Lab Results  Component Value Date   CREATININE 1.02 04/11/2021   CREATININE 1.02 02/19/2021   CREATININE 0.94 02/12/2021    Lab Results  Component Value Date   HGBA1C 7.0 (H) 07/10/2020   Last diabetic Eye exam: No results found for: HMDIABEYEEXA  Last diabetic Foot exam: No results found for: HMDIABFOOTEX      Component Value Date/Time   CHOL 171 11/14/2019 1441   TRIG 97 11/14/2019 1441   HDL 37 (L) 11/14/2019 1441   CHOLHDL 4.6 11/14/2019 1441   CHOLHDL 4.3 11/27/2018 0441   VLDL 35 11/27/2018 0441   LDLCALC 116 (H) 11/14/2019 1441   LDLDIRECT 116.0 05/12/2013 1517    Hepatic Function Latest Ref Rng & Units 04/11/2021 07/10/2020 11/26/2018  Total Protein 6.5 - 8.1 g/dL 7.9 7.1 7.8  Albumin 3.5 - 5.0 g/dL 4.3 4.0 3.9  AST 15 - 41 U/L '18 12 22  ' ALT 0 - 44 U/L '15 13 19  ' Alk Phosphatase 38 - 126 U/L 66 135(H) 88  Total Bilirubin 0.3 - 1.2 mg/dL 1.5(H) 1.2 2.1(H)  Bilirubin, Direct 0.0 - 0.3 mg/dL - - -    Lab Results  Component Value Date/Time   TSH 1.895 07/07/2016 06:31 PM   TSH 1.88 05/05/2013 12:14 PM   TSH 2.290 **Test methodology is 3rd  generation TSH** 06/23/2009 07:55 AM    CBC Latest Ref Rng & Units 04/11/2021 12/27/2019 11/26/2018  WBC 4.0 - 10.5 K/uL 11.0(H) 7.4 7.7  Hemoglobin 13.0 - 17.0 g/dL 15.2 15.5 17.4(H)  Hematocrit 39.0 - 52.0 % 45.9 47.6 53.7(H)  Platelets 150 - 400 K/uL 211 221.0 165    Lab Results  Component Value Date/Time   VD25OH 10.8 (L) 11/03/2016 02:53 PM    Clinical ASCVD: Yes  The ASCVD Risk score (Arnett DK, et al., 2019) failed to calculate for the following reasons:   The patient has a prior MI or stroke diagnosis     Social History   Tobacco Use  Smoking Status Every Day   Packs/day: 0.50   Years: 45.00   Pack years: 22.50   Types: Cigarettes   Start date: 1976  Smokeless Tobacco Never  Tobacco Comments   1 pack daily-03/10/21   BP Readings from Last 3 Encounters:  04/11/21 135/78  03/31/21 118/66  03/10/21 124/72   Pulse Readings from Last 3 Encounters:  04/11/21 65  03/31/21 (!) 56  03/10/21 (!) 56   Wt Readings from Last 3 Encounters:  04/11/21 221 lb 12.5 oz (100.6 kg)  03/31/21 222 lb (100.7 kg)  03/10/21 217 lb 3.2 oz (98.5 kg)    Assessment: Review of patient past medical history, allergies, medications, health status, including review of consultants reports, laboratory and other test data, was performed as part of comprehensive evaluation and provision of chronic care management services.   SDOH:  (Social Determinants of Health) assessments and interventions performed:      CCM Care Plan  No Known Allergies  Medications Reviewed Today     Reviewed by Cherre Robins, PharmD (Pharmacist) on 04/24/21 at 1517  Med List Status: <None>   Medication Order Taking? Sig Documenting Provider Last Dose Status Informant  aspirin EC 81 MG EC tablet [id number] Yes Take 1 tablet (81 mg total) by mouth daily with breakfast. Roxan Hockey, MD Taking Active   atorvastatin (LIPITOR) 80 MG tablet [id number] Yes Take 1 tablet (80 mg total) by mouth daily at 6 PM. Burnell Blanks, MD Taking Active   budesonide-formoterol [facility]) 160-4.5 MCG/ACT inhaler [id number] Yes Inhale 2 puffs into the lungs 2 (two) times daily.  Patient taking differently: Inhale 2 puffs into the lungs 2 (two) times daily. As needed   Colon Branch, MD Taking Active   clopidogrel (PLAVIX) 75 MG tablet [id number] Yes TAKE 1 TABLET (75 MG TOTAL) BY MOUTH DAILY. Burnell Blanks, MD Taking Active   fenofibrate (TRICOR) 145 MG tablet [id number] Yes Take 1 tablet (145 mg total) by mouth daily. Colon Branch, MD Taking Active   folic acid (FOLVITE) 1 MG tablet [id number] Yes Take 1 tablet (1 mg total) by mouth daily. Roxan Hockey, MD Taking Active   furosemide (LASIX) 20 MG tablet [id number] Yes Take 3 tablets (60 mg total) by mouth daily. Burnell Blanks, MD Taking Active   HYDROcodone-acetaminophen (NORCO/VICODIN)  5-325 MG tablet [id number] Yes Take 1 tablet by mouth every 6 (six) hours as needed for moderate pain. Fredia Sorrow, MD Taking Active   isosorbide mononitrate (IMDUR) 60 MG 24 hr tablet [id number] Yes Take 1.5 tablets (90 mg total) by mouth daily. Burnell Blanks, MD Taking Active   metoprolol tartrate (LOPRESSOR) 25 MG tablet [id number] Yes Take 1 tablet (25 mg total) by mouth 2 (two) times daily. Burnell Blanks, MD Taking Active   nitroGLYCERIN (NITROSTAT) 0.4 MG SL tablet [id number] Yes Place 1 tablet (0.4 mg total) under the tongue every 5 (five) minutes x 3 doses as needed for chest pain. Colon Branch, MD Taking Active   pantoprazole (PROTONIX) 40 MG tablet [id number] Yes TAKE 1 TABLET TWICE DAILY Burnell Blanks, MD Taking Active   spironolactone (ALDACTONE) 25 MG tablet [id number] Yes Take 0.5 tablets (12.5 mg total) by mouth daily. Burnell Blanks, MD Taking Active   tamsulosin [facility]) 0.4 MG CAPS capsule [id number] Yes Take 0.4 mg by mouth at bedtime. [provider] Taking Active   thiamine 100 MG tablet [id number] Yes Take  1 tablet (100 mg total) by mouth daily. Roxan Hockey, MD Taking Active   traZODone (DESYREL) 50 MG tablet [id number] Yes Take 1 tablet (50 mg total) by mouth at bedtime.  Patient taking differently: Take 100 mg by mouth at bedtime.   Colon Branch, MD Taking Active   valsartan (DIOVAN) 80 MG tablet [id number] Yes Take 1 tablet (80 mg total) by mouth daily. Burnell Blanks, MD Taking Active             Patient Active Problem List   Diagnosis Date Noted   COPD 0 AB 03/31/2021   (HFpEF) heart failure with preserved ejection fraction (HCC)    DOE (dyspnea on exertion) 11/07/2020   Diabetes ([place]) 03/18/2020   Prolapsed lumbar disc 01/25/2020   Closed fracture of lateral malleolus 01/25/2020   Hiatal hernia    Hepatic steatosis    Hemianopia, homonymous, right    ETOH abuse    Esophagitis    Chronic diastolic CHF (congestive heart failure) (HCC)    Chronic bronchitis ([place])    CAD (coronary artery disease)    PAD (peripheral artery disease) ([place]) 09/23/2016   Morbid obesity ([place])    Chronic pain syndrome 10/23/2015   GERD (gastroesophageal reflux disease) 08/07/2015   Pancreatitis, acute    PCP NOTES >>>>> 06/01/2015   Annual physical exam 05/31/2015   Acute ischemic stroke ([place]) 02/20/2015   Hx of cocaine abuse ([place]) 02/20/2015   BPH (benign prostatic hyperplasia) 02/20/2015   OSA (obstructive sleep apnea) ?? 05/05/2013   Noncompliance 05/05/2013   Anxiety and depression 05/05/2013   Erectile dysfunction 03/17/2012   Emphysema (subcutaneous) (surgical) resulting from a procedure 10/08/2008   Iron deficiency anemia 08/10/2008   ABNORMALITY OF GAIT 08/06/2008   Cigarette smoker 07/27/2008   Dyslipidemia 07/26/2008   Essential hypertension 07/26/2008   ST elevation myocardial infarction involving left circumflex coronary artery ([place]) 07/26/2008    Immunization History  Administered Date(s) Administered   PFIZER(Purple Top)SARS-COV-2 Vaccination 11/17/2019,  12/11/2019, 07/18/2020   Pneumococcal Polysaccharide-23 11/03/2016   Tdap 11/03/2016    Conditions to be addressed/monitored: CHF, CAD, HTN, HLD, Hypertriglyceridemia, COPD, DMII, and BPH; OSA; GERD; fatty liver; h/o strokePAD; sleep disturbance; anxiety/ depression  Care Plan : Pharmacy Care Plan  Updates made by Cherre Robins, PHARMD since 04/24/2021 12:00 AM     Problem: HTN; CHF: CAD; GERD with esophagitis; depression;  BPH; COPD; bronchitis; stroke; elevated TG and hyperlipidemia; type 2 DM with h/o pancreatitis   Priority: High  Onset Date: 03/18/2021     Long-Range Goal: Management of chronic conditions and medications   Start Date: 03/18/2021  Priority: High  Note:   Current Barriers:  Unable to independently monitor therapeutic efficacy Unable to achieve control of Hyperlipidemia  Unable to maintain control of HTN, CHF Does not adhere to prescribed medication regimen Does not contact provider office for questions/concerns  Pharmacist Clinical Goal(s):  Over the next 90 days, patient will verbalize ability to afford treatment regimen achieve adherence to monitoring guidelines and medication adherence to achieve therapeutic efficacy achieve control of Hyperlipidemia as evidenced by LDL <70 maintain control of CHF, HTN and type 2 DM as evidenced by attaining goals listed below  adhere to prescribed medication regimen as evidenced by refill history contact provider office for questions/concerns as evidenced notation of same in electronic health record through collaboration with PharmD and provider.   Interventions: 1:1 collaboration with Colon Branch, MD regarding development and update of comprehensive plan of care as evidenced by provider attestation and co-signature Inter-disciplinary care team collaboration (see longitudinal plan of care) Comprehensive medication review performed; medication list updated in electronic medical record  Diabetes: Uncontrolled; Goal A1c <  6.5% Last A1c was 7.0% (07/10/2020) Current treatment:  None Tried metformin in past but stopped due to GI side effects (tried 557m/day and 2561mday) History of pancreatitis (possibly related to alcohol use)   Current glucose readings: does not currently check BG at home Denies hypoglycemic/hyperglycemic symptoms Current meal patterns: over the last 2 to 3 weeks has been limiting bread and sweets Current exercise: none currently Interventions: (addressed at previous visits)   Counseled on A1c and BG goals ,  Recommend recheck A1c with next labs. If >6.5% consider starting SGLT2)  Hypertension / CHF: Improving; goal: decrease symptoms of CHF, prevent exacerbations and BP <140/90.  Current treatment: Furosemide 2071m take 3 tablets = 4m64mily in the morning Metoprolol tartrate 25mg9mce a day Spironolactone 25mg 64mke 0.5 tablet = 12.5mg ea51mmorning Valsartan 80mg da39mNot checking weight at home but will start when his new scales arrive (expecting in next 2 to 3 days) EF = 55-60% (01/11/2084/88/50272 systolic dysfunction NYHA 2b Past medications tried: lisinopril 10mg - c24me to valsartan by pulmonologist.  Denies hypotensive/hypertensive symptoms Interventions:  Reviewed blood pressure goals Discussed signs and symptoms of CHF exacerbation - weight gain, SOB, abdominal fullness, swelling in legs or abdomen, Fatigue and weakness, changes in ability to perform usual activities, persistent cough or wheezing with white or pink blood-tinged mucus, nausea and lack of appetite Recommended he weigh daily - report weight gain of more than 3 lbs in 24 hours or 5 lbs in 1 week.   Hyperlipidemia / CAD / history of stroke / PAD: Uncontrolled; LDL goal <70; Tg goal <150 Last LDL was 116 and Tg was 97 Current treatment: Atorvastatin 80mg dail5mnofibrate 145mg daily53mirin 81mg daily 17m breakfast  Clopidogrel 75mg daily I36mrbide ER 4mg 0 take 140mablets = 90mg  daily Ni61mlycerin 0.4mg - place 1 t4met under the tongue and allow to dissolve as needed for chest pain Medications previously tried: Repatha - stopped due to cost; Brilenta  - stopped due to shortness of breath Patient reports he has restarted aspirin. He has not noted increase in nosebleeds but still has 1 nosebleed every night on right side only. He does have ENT appt  for 04/28/2021 at 3:30pm Interventions:  Continue aspirin 57m daily. Follow up as planned with ENT Follow up appointment made for patient to see PCP in September.   Chronic Obstructive Pulmonary Disease / OSA: Uncontrolled;  Current treatment: CPAP Symbicort (budesonide/formoterol) 160/4.5 - inhale 2 puffs into lungs twice a day Managed by pulmonologist - Dr WMelvyn Novas has an appointment later today Patient reports he has not been using Symbicort daily PFT's  03/31/2021  FEV1 1.93 (53 % ) ratio 0.70  p 13 % improvement from saba p 0 prior to study with   FV curve no signficant curvature Previous medications tried: Spiriva - not effective Interventions:  Continue to follow up with pulmonologist  Discussed that Symbicort was for healing his lungs / for maintenance. Patient will restart using 2 puff twice a day Reminded patient to rinse mouth after using Symbicort.   Tobacco Use disorder:  Patient has restarted smoking after quitting for about 4 months.  Reports smoking 1ppd Patient states he wants to quit.  Past therapies tried: nicotine patches (not effective) ; Chantix (not helpful); Cold [place]- relapsed.  Interventions:  Discussed options for smoking cessation. Patient would like to try bupropion. Will discuss with his PCP.   Medication Management:  Goal: take medications as prescribed; consult pharmacy if any questions or issues with medication access [facility] Patient does not have transportation so mail order is a great option to improve access Patient also has homonymous hemianopsia  that resulted from one of his strokes. Sometimes has difficulty reading labels on his prescription bottles.  His sister, CAlyse Lowis helping patient with medications as she is able ([place]  Interventions: (at previous visit)  Discussed using either stickers of color pill containers to help patient identify morning, afternoon, evening and night medications Reviewed each medication with patient and discussed how to take and reason for taking.  Reviewed refill history and patient should have all needed medications at this time. Will be due to reorder some meds at the end of September. [facility]   Assisted patient today in applying for Extra Assistance through Medicare. He should receive letter indicating if approved or denies in 3 to 4 weeks.    Patient Goals/Self-Care Activities Over the next 90 days, patient will:  take medications as prescribed, focus on medication adherence by utilizing mail order pharmacy and contacting clinical pharmacist about any medications questions or concerns, check blood pressure 2 to 3 times per week, document, and provide at future appointments, weigh daily, and contact provider if weight gain of more than 3 lbs in 24 hours or 5 lbs in 1 week, and collaborate with provider on medication access solutions  Follow Up Plan: Telephone follow up appointment with care management team member scheduled for:  4 weeks.             Medication Assistance:  none at this time but might need assistance if additional lipid lowering therapy needed in future.  Applied for LIS today  [facility] [id number]: [phone]: [phone] Uses pill box? No - patient has declined to use pill container but he is starting to use  stickers on bottles to help identify time of day to take each medication  Follow  Up:  Patient agrees to Care Plan and Follow-up.  Plan: follow up phone call with clinical pharmacist planned for 3 to 4 weeks.  Cherre Robins, PharmD Clinical Pharmacist [facility] [facility]

## 2021-04-27 ENCOUNTER — Other Ambulatory Visit: Payer: Self-pay | Admitting: Cardiovascular Disease

## 2021-04-27 DIAGNOSIS — J9611 Chronic respiratory failure with hypoxia: Secondary | ICD-10-CM | POA: Diagnosis not present

## 2021-04-27 DIAGNOSIS — J449 Chronic obstructive pulmonary disease, unspecified: Secondary | ICD-10-CM | POA: Diagnosis not present

## 2021-04-28 ENCOUNTER — Ambulatory Visit (INDEPENDENT_AMBULATORY_CARE_PROVIDER_SITE_OTHER): Payer: Medicare HMO | Admitting: Otolaryngology

## 2021-04-28 ENCOUNTER — Other Ambulatory Visit: Payer: Self-pay

## 2021-04-28 DIAGNOSIS — R490 Dysphonia: Secondary | ICD-10-CM | POA: Diagnosis not present

## 2021-04-28 DIAGNOSIS — Z87898 Personal history of other specified conditions: Secondary | ICD-10-CM

## 2021-04-28 NOTE — Progress Notes (Signed)
HPI: [name]. is a 60 y.o. male who presents is referred by his PCP for evaluation of epistaxis.  He has had bleeding from the right side of his nose for couple months although more recently he has not had as much bleeding.  The last time he had a nosebleed was about 3 days ago and it stopped relatively easy with pressure and some tissue in the nose.  He has history of congestive heart failure and is on Plavix as well as baby aspirin He also complains of chronic hoarseness although he does not sound that hoarse today in the office.  He does have reflux medication and has history of acid reflux which may be contributing some to his hoarseness. However he continues to smoke about a pack a day. He states that when he has a nosebleed it is always from the right side and does not have much bleeding from the left side..  Past Medical History:  Diagnosis Date   (HFpEF) heart failure with preserved ejection fraction ([place])    Echo 6/22: EF 55-60, no RWMA, GR 3 DD, elevated LVEDP, normal RVSF, RVSP 53.2, mild to moderate AV sclerosis without stenosis, dilated aortic root (41 mm), ascending aorta 37 mm   Abnormality of gait    Anxiety    CAD (coronary artery disease)    a. 10/2008 Inferolateral STEMI: 3VD w/ occluded LCX (PTCA)-->CABG x 3 (LIMA->LAD, VG->OM, VG->PDA);  b. 06/2009 NSTEMI/PCI: VG->OM 100 (B[facility]);  c. 05/2013 native 3VD, 3/3 patent grafts. d. 06/2016: Inferior STEMI w/ occluded SVG-OM (DES placed). LIMA-LAD and SVG-PDA patent.   Chronic bronchitis (HCC)    Chronic diastolic CHF (congestive heart failure) ([place])    a. 02/2015 Echo: EF 50-55%, distal septal, apical, inferobasal HK, mild LVH, mild MR, mildly dil LA.   Cocaine use    Colon polyps    a. 09/2015 colonscopy: multiple sessile polyps, path negative for high grade dysplasia.   Depression    Emphysema (subcutaneous) (surgical) resulting from a procedure 10/2008.   Bleb resected at time of 10/2008 CABG. Marketed emphysema noted on  surgical report.   Esophagitis    a. 2017 EGD: esophagitis, duodenitis.   ETOH abuse    GERD (gastroesophageal reflux disease)    Hemianopia, homonymous, right    "can't see out the right side of either eye since stroke in 2016/pt description 09/23/2016   Hepatic steatosis    Hiatal hernia    History of nuclear stress test 08/2017   Nuclear stress test 1/19: EF 35, inf-lat and ant-lat, apical sept/apical scar; no ischemia; Intermediate Risk   Hyperlipidemia    Hypertension    Iron deficiency anemia 2010   Morbid obesity ([place])    Myocardial infarction [facility]) "several"   PAD (peripheral artery disease) ([place])    Pancreatitis 06/2015   Sleep apnea    "don't have a mask" (09/23/2016)   Stroke ([place]) 02/2015   a. 02/2015 Carotid U/S: 1-39% bilat ICA stenosis; "left me blind" (09/23/2016)   Tobacco abuse    still smokes   Tubular adenoma of colon    Past Surgical History:  Procedure Laterality Date   ABDOMINAL AORTOGRAM W/LOWER EXTREMITY N/A 09/23/2016   Procedure: Abdominal Aortogram w/Lower Extremity;  Surgeon: Wellington Hampshire, MD;  Location: [place] [facility];  Service: Cardiovascular;  Laterality: N/A;   CARDIAC CATHETERIZATION  11/07/2008   EF of 50-55% -- normal LV systolic function, acute inferolateral ST elevation MI,  PTCA of the circumflex artery -- Dorothyann Peng  Benito Mccreedy, [name]   CARDIAC CATHETERIZATION N/A 07/07/2016   Procedure: Left Heart Cath and Cors/Grafts Angiography;  Surgeon: [name], MD;  Location: [place];  Service: Cardiovascular;  Laterality: N/A;   CARDIAC CATHETERIZATION N/A 07/07/2016   Procedure: Coronary Stent Intervention;  Surgeon: [name], MD;  Location: [facility];  Service: Cardiovascular;  Laterality: N/A;   CORONARY ANGIOPLASTY     CORONARY ARTERY BYPASS GRAFT     "CABG X4"   LEFT HEART CATH AND CORS/GRAFTS ANGIOGRAPHY N/A 10/01/2017   Procedure: LEFT HEART CATH AND CORS/GRAFTS ANGIOGRAPHY;  Surgeon: Burnell Blanks,  MD;  Location: [place] [facility] [facility];  Service: Cardiovascular;  Laterality: N/A;   LEFT HEART CATHETERIZATION WITH CORONARY/GRAFT ANGIOGRAM N/A 05/15/2013   Procedure: LEFT HEART CATHETERIZATION WITH Beatrix Fetters;  Surgeon: [name], MD;  Location: [facility];  Service: Cardiovascular;  Laterality: N/A;   PERIPHERAL VASCULAR INTERVENTION  09/23/2016   Procedure: Peripheral Vascular Intervention;  Surgeon: Wellington Hampshire, MD;  Location: [place] [facility];  Service: Cardiovascular;;  Lt. SFA   Social History   Socioeconomic History   Marital status: Single    Spouse name: Not on file   Number of children: 2   Years of education: Not on file   Highest education level: Not on file  Occupational History   Occupation: not working , used to be a Chief Executive Officer    Occupation: disable  Tobacco Use   Smoking status: Every Day    Packs/day: 0.50    Years: 45.00    Pack years: 22.50    Types: Cigarettes    Start date: 1976   Smokeless tobacco: Never   Tobacco comments:    1 pack daily-03/10/21  Vaping Use   Vaping Use: Never used  Substance and Sexual Activity   Alcohol use: Yes    Alcohol/week: 14.0 standard drinks    Types: 14 Cans of beer per week    Comment: as off 12/2017: beer 12-pack q week   Drug use: No    Comment: 09/23/2016 "none in years"   Sexual activity: Not Currently  Other Topics Concern   Not on file  Social History Narrative   Lives by himself   Social Determinants of Health   Financial Resource Strain: Medium Risk   Difficulty of Paying Living Expenses: Somewhat hard  Food Insecurity: Not on file  Transportation Needs: Unmet Transportation Needs   Lack of Transportation (Medical): Yes   Lack of Transportation (Non-Medical): Yes  Physical Activity: Not on file  Stress: Not on file  Social Connections: Not on file   Family History  Problem Relation Age of Onset   Heart disease Mother    Heart failure Mother    Diabetes Mother     Colon cancer Father        late 30s   Prostate cancer Neg Hx    No Known Allergies Prior to Admission medications   Medication Sig Start Date End Date Taking? Authorizing Provider  aspirin EC 81 MG EC tablet Take 1 tablet (81 mg total) by mouth daily with breakfast. 11/28/18   Roxan Hockey, MD  atorvastatin (LIPITOR) 80 MG tablet Take 1 tablet (80 mg total) by mouth daily at 6 PM. 03/08/21   Burnell Blanks, MD  budesonide-formoterol (SYMBICORT) 160-4.5 MCG/ACT inhaler Inhale 2 puffs into the lungs 2 (two) times daily. Patient taking differently: Inhale 2 puffs into the lungs 2 (two) times daily. As  needed 03/10/21   Colon [place], MD  clopidogrel (PLAVIX) 75 MG tablet TAKE 1 TABLET EVERY DAY 04/28/21   Burnell Blanks, MD  fenofibrate (TRICOR) 145 MG tablet Take 1 tablet (145 mg total) by mouth daily. 03/10/21   Colon [place], MD  folic acid (FOLVITE) 1 MG tablet Take 1 tablet (1 mg total) by mouth daily. 11/28/18   Roxan Hockey, MD  furosemide (LASIX) 20 MG tablet Take 3 tablets (60 mg total) by mouth daily. 03/18/21   Burnell Blanks, MD  HYDROcodone-acetaminophen (NORCO/VICODIN) 5-325 MG tablet Take 1 tablet by mouth every 6 (six) hours as needed for moderate pain. 04/11/21   Fredia Sorrow, MD  isosorbide mononitrate (IMDUR) 60 MG 24 hr tablet Take 1.5 tablets (90 mg total) by mouth daily. 03/08/21   Burnell Blanks, MD  metoprolol tartrate (LOPRESSOR) 25 MG tablet TAKE 1 TABLET TWICE DAILY 04/28/21   Burnell Blanks, MD  nitroGLYCERIN (NITROSTAT) 0.4 MG SL tablet Place 1 tablet (0.4 mg total) under the tongue every 5 (five) minutes x 3 doses as needed for chest pain. 03/10/21   Colon [place], MD  pantoprazole (PROTONIX) 40 MG tablet TAKE 1 TABLET TWICE DAILY 11/26/20   Burnell Blanks, MD  spironolactone (ALDACTONE) 25 MG tablet Take 0.5 tablets (12.5 mg total) by mouth daily. 03/18/21   Burnell Blanks, MD  tamsulosin (FLOMAX) 0.4 MG CAPS capsule  Take 0.4 mg by mouth at bedtime. 12/17/20   [provider]  thiamine 100 MG tablet Take 1 tablet (100 mg total) by mouth daily. 11/29/18   Roxan Hockey, MD  traZODone (DESYREL) 50 MG tablet Take 1 tablet (50 mg total) by mouth at bedtime. Patient taking differently: Take 100 mg by mouth at bedtime. 03/10/21   Colon [place], MD  valsartan (DIOVAN) 80 MG tablet Take 1 tablet (80 mg total) by mouth daily. 03/08/21   Burnell Blanks, MD     Positive ROS: Otherwise negative  All other systems have been reviewed and were otherwise negative with the exception of those mentioned in the HPI and as above.  Physical Exam: Constitutional: Alert, well-appearing, no acute distress Ears: External ears without lesions or tenderness. Ear canals are clear bilaterally with intact, clear TMs.  Nasal: External nose without lesions. Septum with mild deformity and mild rhinitis.  After decongesting the nose with Afrin nasal passages were otherwise clear.  On nasal endoscopy no polyps or intranasal masses noted.  Both millimeters regions were clear and the nasopharynx was clear..  He has a few prominent anterior septal vessels on the right side but none of them bled easily on rubbing with a Q-tip and no intranasal masses noted. Oral: Lips and gums without lesions. Tongue and palate mucosa without lesions. Posterior oropharynx clear.  Patient with elongated uvula but normal-appearing tonsils bilaterally and no mucosal abnormalities noted. Fiberoptic laryngoscopy was performed to the right nostril to evaluate vocal cords.  The vocal cords were clear bilaterally with normal vocal cord mobility. Neck: No palpable adenopathy or masses Respiratory: Breathing comfortably  Skin: No facial/neck lesions or rash noted.  Laryngoscopy  Date/Time: 04/28/2021 5:04 PM Performed by: Rozetta Nunnery, MD Authorized by: Rozetta Nunnery, MD   Consent:    Consent obtained:  Verbal   Consent given by:   Patient Procedure details:    Indications: hoarseness, dysphagia, or aspiration     Medication:  Afrin   Instrument: flexible fiberoptic laryngoscope     Scope location:  right nare   Sinus:    Right middle meatus: normal     Right nasopharynx: normal   Mouth:    Oropharynx: normal     Vallecula: normal     Epiglottis: normal   Throat:    True vocal cords: normal   Comments:     On fiberoptic laryngoscopy the right nasal cavity and nasopharynx was clear.  Vocal cords were clear bilaterally with normal vocal mobility.  Assessment: Right-sided epistaxis. Patient reports history of hoarseness with GE reflux problems.  Patient continues to smoke about a pack a day.  Plan: Reviewed with patient concerning his hoarseness that on fiberoptic laryngoscopy he had no vocal cord lesions noted with clear vocal cords bilaterally and symmetric mobility.  Hoarseness may be related to reflux.  But no vocal cord lesions noted. Concerning his right-sided epistaxis I cannot identify definite site of origin of his right-sided epistaxis and reviewed with him concerning use of cottonball with Afrin to stop any nosebleeds that are difficult to stop. Recommended follow-up here if he has frequent nosebleeds for cauterization but with follow-up within 24 hours of a nosebleed if needed.   Radene Journey, MD   CC:

## 2021-04-29 ENCOUNTER — Ambulatory Visit: Payer: Medicare HMO | Admitting: Physician Assistant

## 2021-04-30 ENCOUNTER — Telehealth: Payer: Self-pay

## 2021-04-30 MED ORDER — EZETIMIBE 10 MG PO TABS: 10.0000 mg | ORAL_TABLET | Freq: Every day | ORAL | 3 refills | Status: DC

## 2021-04-30 NOTE — Telephone Encounter (Signed)
-----   Message from Leeroy Bock, [place] sent at 04/30/2021  7:18 AM EDT ----- Regarding: RE: REPATHA PA Looks like his PCP removed med from his list in March, he had stopped it due to cost. Would probably give him a call to verify, he shouldn't have been in the donut hole in March so his copay should have been ~$45/month. If that was cost prohibitive for him, can add him to our spreadsheet to call when [facility] opens back up. Please also have him start ezetimibe 10mg  - 1 tablet once daily to help lower his LDL in the mean time. He should continue on his other lipid meds (atorvastatin 80mg  daily and fenofibrate 145mg  daily). Thank you! ----- Message ----- From: Allean Found, CMA Sent: 04/28/2021   5:30 PM EDT To: Leeroy Bock, RPH-CPP Subject: REPATHA PA                                     I went to do pa renewal and the med isn't on list and was discontinued 01/01/20 I didn't see why though. Shall I proceed w/renewal or do I need to contact the pt and find out if they stopped and why?

## 2021-04-30 NOTE — Telephone Encounter (Signed)
Called and lmomed the pt that we need him to start zetia 10mg  qd in additon to other lipid lowering meds as instructed. Also I mentioned that I may be able to get him patient assistance depending on if he is interested or not and to please call us back.  Pa for repatha 140mg  sureclick submitted on [email or web address] Taiten Branan (Key: [id number])

## 2021-05-01 NOTE — Telephone Encounter (Signed)
RETURNED  a call to the pt's sister and they they stated that they would prefer to have their labs drawn (lipids) prior to starting the zetia and the repatha. Pt's sister stated that unless the repatha is free he will probably not do it. I can probably get him approved for patient assistance Also they requested someone to please go over the lab work that they had done in the hospital he seems to already be on Dr. Ethel Rana schedule for 05/07/21 at 1pm. They seem hesitant to go on zetia and repatha since they are already taking tricor and lipitor they feel that may be to much. Routing to megan supple to make her aware. They also requested this msg to be sent to Dr. [name] as well.

## 2021-05-01 NOTE — Telephone Encounter (Signed)
Patient's sister calling for information on the medications the patient was told to start.

## 2021-05-01 NOTE — Telephone Encounter (Signed)
Pt has had multiple MIs, PAD, stroke, and DM. He is at very high cardiac risk and his LDL goal is < 55. That is fine if he wants to have cholesterol rechecked first if you can help schedule this. I don't see that he's had a lipid panel drawn in the last year. At that time his LDL was 116 on atorvastatin 80mg  daily which is why he was started on PCSK9i therapy. He will likely need to resume this to bring LDL to goal; ezetimibe is a back up option if Repatha isn't affordable (aka if he can't get patient assistance since it sounds like he won't take it if there is a copay).  He had basic labs drawn in the ED on 9/2 if that is what he is referring to. They were stable except his white blood count was mildly elevated, he was treated with pain meds for abdominal pain and advised to follow up with PCP after.

## 2021-05-02 NOTE — Telephone Encounter (Signed)
Noted, will d/w  patient when he comes to the office

## 2021-05-05 ENCOUNTER — Other Ambulatory Visit: Payer: Self-pay | Admitting: Cardiovascular Disease

## 2021-05-07 ENCOUNTER — Ambulatory Visit (INDEPENDENT_AMBULATORY_CARE_PROVIDER_SITE_OTHER): Payer: Medicare HMO | Admitting: Internal Medicine

## 2021-05-07 ENCOUNTER — Other Ambulatory Visit: Payer: Self-pay

## 2021-05-07 ENCOUNTER — Encounter: Payer: Self-pay | Admitting: Internal Medicine

## 2021-05-07 VITALS — BP 116/72 | HR 62 | Temp 98.0°F | Resp 18 | Ht 69.0 in | Wt 216.5 lb

## 2021-05-07 DIAGNOSIS — Z9114 Patient's other noncompliance with medication regimen: Secondary | ICD-10-CM

## 2021-05-07 DIAGNOSIS — E1151 Type 2 diabetes mellitus with diabetic peripheral angiopathy without gangrene: Secondary | ICD-10-CM

## 2021-05-07 DIAGNOSIS — E119 Type 2 diabetes mellitus without complications: Secondary | ICD-10-CM | POA: Diagnosis not present

## 2021-05-07 DIAGNOSIS — I5032 Chronic diastolic (congestive) heart failure: Secondary | ICD-10-CM

## 2021-05-07 DIAGNOSIS — Z0100 Encounter for examination of eyes and vision without abnormal findings: Secondary | ICD-10-CM | POA: Diagnosis not present

## 2021-05-07 DIAGNOSIS — I10 Essential (primary) hypertension: Secondary | ICD-10-CM

## 2021-05-07 DIAGNOSIS — E785 Hyperlipidemia, unspecified: Secondary | ICD-10-CM

## 2021-05-07 NOTE — Patient Instructions (Addendum)
Recommend to proceed with the following vaccines at your pharmacy:  Shingrix (shingles) Covid #4  You are overdue (since 2018) for a colonoscopy with Dr. Hilarie Fredrickson- please call his office at [phone] to get on his schedule.   We have placed a referral to an eye doctor [facility] in [place]) for an eye exam. Please expect a call from them to schedule an appointment.   Please take the medications as recommended.  See the medication list.  Medication list includes aspirin 81 mg day 1 every day. It also includes a new medication for cholesterol  EZETIMIBE  take 1 tablet daily.  Also take fenofibrate daily   I am updating the list reflecting that you take 2 trazodones to help you sleep  Continue checking your blood pressures BP GOAL is between 110/65 and  135/85. If it is consistently higher or lower, let me know   It is very important you see our clinical pharmacist, when you do, bring all the bottles of medicines you have at home.  GO TO THE LAB : Get the blood work     [place], [place] back for a checkup in 2 months

## 2021-05-07 NOTE — Progress Notes (Signed)
Subjective:    Patient ID: [name]., male    DOB: October 11, 1960, 60 y.o.   MRN: [id number]  DOS:  05/07/2021 Type of visit - description: Routine follow-up Last OV 10/22/2020 Since then had multiple office visits w/ other MDs, they were reviewed. We went over every single of his medications. He is not taking aspirin due to nosebleeds. Still drinking alcohol "not too much".  Did not like to provide  any more details. Still smokes Not using a CPAP  Wt Readings from Last 3 Encounters:  05/07/21 216 lb 8 oz (98.2 kg)  04/11/21 221 lb 12.5 oz (100.6 kg)  03/31/21 222 lb (100.7 kg)    Review of Systems Denies recent chest pain or difficulty breathing. Ambulatory BPs okay. Complain of hoarseness, for a while, not better.  Past Medical History:  Diagnosis Date   (HFpEF) heart failure with preserved ejection fraction ([place])    Echo 6/22: EF 55-60, no RWMA, GR 3 DD, elevated LVEDP, normal RVSF, RVSP 53.2, mild to moderate AV sclerosis without stenosis, dilated aortic root (41 mm), ascending aorta 37 mm   Abnormality of gait    Anxiety    CAD (coronary artery disease)    a. 10/2008 Inferolateral STEMI: 3VD w/ occluded LCX (PTCA)-->CABG x 3 (LIMA->LAD, VG->OM, VG->PDA);  b. 06/2009 NSTEMI/PCI: VG->OM 100 (B[facility]);  c. 05/2013 native 3VD, 3/3 patent grafts. d. 06/2016: Inferior STEMI w/ occluded SVG-OM (DES placed). LIMA-LAD and SVG-PDA patent.   Chronic bronchitis (HCC)    Chronic diastolic CHF (congestive heart failure) ([place])    a. 02/2015 Echo: EF 50-55%, distal septal, apical, inferobasal HK, mild LVH, mild MR, mildly dil LA.   Cocaine use    Colon polyps    a. 09/2015 colonscopy: multiple sessile polyps, path negative for high grade dysplasia.   Depression    Emphysema (subcutaneous) (surgical) resulting from a procedure 10/2008.   Bleb resected at time of 10/2008 CABG. Marketed emphysema noted on surgical report.   Esophagitis    a. 2017 EGD: esophagitis, duodenitis.   ETOH abuse     GERD (gastroesophageal reflux disease)    Hemianopia, homonymous, right    "can't see out the right side of either eye since stroke in 2016/pt description 09/23/2016   Hepatic steatosis    Hiatal hernia    History of nuclear stress test 08/2017   Nuclear stress test 1/19: EF 35, inf-lat and ant-lat, apical sept/apical scar; no ischemia; Intermediate Risk   Hyperlipidemia    Hypertension    Iron deficiency anemia 2010   Morbid obesity ([place])    Myocardial infarction [facility]) "several"   PAD (peripheral artery disease) ([place])    Pancreatitis 06/2015   Sleep apnea    "don't have a mask" (09/23/2016)   Stroke ([place]) 02/2015   a. 02/2015 Carotid U/S: 1-39% bilat ICA stenosis; "left me blind" (09/23/2016)   Tobacco abuse    still smokes   Tubular adenoma of colon     Past Surgical History:  Procedure Laterality Date   ABDOMINAL AORTOGRAM W/LOWER EXTREMITY N/A 09/23/2016   Procedure: Abdominal Aortogram w/Lower Extremity;  Surgeon: Wellington Hampshire, MD;  Location: [place] [facility];  Service: Cardiovascular;  Laterality: N/A;   CARDIAC CATHETERIZATION  11/07/2008   EF of 50-55% -- normal LV systolic function, acute inferolateral ST elevation MI,  PTCA of the circumflex artery -- [name]. [name], [name]   CARDIAC CATHETERIZATION N/A 07/07/2016   Procedure: Left Heart Cath and Cors/Grafts Angiography;  Surgeon:  [name], MD;  Location: [place] [facility];  Service: Cardiovascular;  Laterality: N/A;   CARDIAC CATHETERIZATION N/A 07/07/2016   Procedure: Coronary Stent Intervention;  Surgeon: [name], MD;  Location: [place] [facility];  Service: Cardiovascular;  Laterality: N/A;   CORONARY ANGIOPLASTY     CORONARY ARTERY BYPASS GRAFT     "CABG X4"   LEFT HEART CATH AND CORS/GRAFTS ANGIOGRAPHY N/A 10/01/2017   Procedure: LEFT HEART CATH AND CORS/GRAFTS ANGIOGRAPHY;  Surgeon: Burnell Blanks, MD;  Location: [place] [facility];  Service: Cardiovascular;  Laterality: N/A;   LEFT  HEART CATHETERIZATION WITH CORONARY/GRAFT ANGIOGRAM N/A 05/15/2013   Procedure: LEFT HEART CATHETERIZATION WITH Beatrix Fetters;  Surgeon: [name], MD;  Location: [facility] C[facility];  Service: Cardiovascular;  Laterality: N/A;   PERIPHERAL VASCULAR INTERVENTION  09/23/2016   Procedure: Peripheral Vascular Intervention;  Surgeon: Wellington Hampshire, MD;  Location: [place] [facility];  Service: Cardiovascular;;  Lt. SFA    Allergies as of 05/07/2021   No Known Allergies      Medication List        Accurate as of May 07, 2021 11:59 PM. If you have any questions, ask your nurse or doctor.          STOP taking these medications    HYDROcodone-acetaminophen 5-325 MG tablet Commonly known as: NORCO/VICODIN Stopped by: [name], MD       TAKE these medications    aspirin 81 MG EC tablet Take 1 tablet (81 mg total) by mouth daily with breakfast.   atorvastatin 80 MG tablet Commonly known as: LIPITOR TAKE 1 TABLET (80 MG TOTAL) BY MOUTH DAILY AT 6 PM. NEED TO KEEP APPOINTMENT FOR ANY FUTURE REFILLS.   budesonide-formoterol 160-4.5 MCG/ACT inhaler Commonly known as: SYMBICORT Inhale 2 puffs into the lungs 2 (two) times daily. What changed: additional instructions   clopidogrel 75 MG tablet Commonly known as: PLAVIX TAKE 1 TABLET EVERY DAY   ezetimibe 10 MG tablet Commonly known as: ZETIA Take 1 tablet (10 mg total) by mouth daily.   fenofibrate 145 MG tablet Commonly known as: Tricor Take 1 tablet (145 mg total) by mouth daily.   folic acid 1 MG tablet Commonly known as: FOLVITE Take 1 tablet (1 mg total) by mouth daily.   furosemide 20 MG tablet Commonly known as: LASIX Take 3 tablets (60 mg total) by mouth daily.   isosorbide mononitrate 60 MG 24 hr tablet Commonly known as: IMDUR Take 1.5 tablets (90 mg total) by mouth daily.   metoprolol tartrate 25 MG tablet Commonly known as: LOPRESSOR TAKE 1 TABLET TWICE DAILY   nitroGLYCERIN 0.4 MG SL  tablet Commonly known as: Nitrostat Place 1 tablet (0.4 mg total) under the tongue every 5 (five) minutes x 3 doses as needed for chest pain.   pantoprazole 40 MG tablet Commonly known as: PROTONIX TAKE 1 TABLET TWICE DAILY   spironolactone 25 MG tablet Commonly known as: ALDACTONE Take 0.5 tablets (12.5 mg total) by mouth daily.   tamsulosin 0.4 MG Caps capsule Commonly known as: FLOMAX Take 0.4 mg by mouth at bedtime.   thiamine 100 MG tablet Take 1 tablet (100 mg total) by mouth daily.   traZODone 50 MG tablet Commonly known as: DESYREL Take 2 tablets (100 mg total) by mouth at bedtime.   valsartan 80 MG tablet Commonly known as: Diovan Take 1 tablet (80 mg total) by mouth daily.  Objective:   Physical Exam BP 116/72 (BP Location: Left Arm, Patient Position: Sitting, Cuff Size: Normal)   Pulse 62   Temp 98 F (36.7 C) (Oral)   Resp 18   Ht 5\' 9"  (1.753 m)   Wt 216 lb 8 oz (98.2 kg)   SpO2 94%   BMI 31.97 kg/m  General:   Well developed, NAD, BMI noted. HEENT:  Normocephalic . Face symmetric, atraumatic.  Minimal hoarseness noted Lungs:  CTA B Normal respiratory effort, no intercostal retractions, no accessory muscle use. Heart: RRR,  no murmur.  Lower extremities: no pretibial edema bilaterally  Skin: Not pale. Not jaundice Neurologic:  alert & oriented X3.  Speech normal, gait appropriate for age and unassisted Psych--  Cognition and judgment appear intact.  Cooperative with normal attention span and concentration.  Behavior appropriate. No anxious or depressed appearing.      Assessment     Assessment   DM HTN Dyslipidemia CV: --CAD: 2010 angioplasty f/u by  CABG 2010, cath 2014 -- CP, cath 09/2017, patent grafts, medical treatment --PVD --Stroke 2008, 02-2015, 2020 Anxiety depression  OSA: Sleep study 01/2017, significant sleep apnea noted Abnormal gait GI: Iron def anemia dx 03-2015  ---cscope 09-2015 multiple polyps, repeat  in one year ---EGD-2017: Esophagitis, duodenitis, bx done Pancreatitis 06/2015.(EtOH 2 days prior to onset of symptoms)  etoh abuse H/o cocaine  Smoker  +FH: father prostate ca , dx late 60s Back pain:saw Ortho 01/2020, SXS felt to be mechanical due to: JD, deconditioning, residual weakness from the stroke.  PVD likely to be contributing. COPD: Clinical diagnosis, has not pursued PFTs  PLAN High cholesterol: See phone note from 04/30/2021 Unclear if he is taking Zetia or fenofibrate.  Apparently he takes Lipitor daily. He is unable to afford Repatha. Plan: Check a lipid panel, he is not fasting today.  Encouraged to take Lipitor, Zetia and fenofibrate. DM: No recent eye exam, referral sent Currently on no DM medications.  Check A1c HTN: BP was elevated when he saw cardiology 01/28/2021, they were considering med adjustment. BP today is good, reports normal ambulatory BPs.  Last BMP okay. Continue Lasix, Imdur, metoprolol, Aldactone, valsartan. CHF, CAD: Seen by cardiology 01/28/2021.  Echo showed normal LV function.  Moderate pulmonary hypertension.  Was slightly volume overloaded. They rec to continue furosemide.   Currently not taking aspirin due to recent nosebleeds, strongly recommend to go back on it, if nosebleeds return he will let me know; reports good compliance with Plavix OSA, hypoxemia, COPD 01/28/2021: Saw pulmonary, Rx CPAP 01/28/2021, Rx Symbicort.  Last visit with pulmonary 03/31/2021, they noted that cough improved after they stopped ACE inhibitor's 11/07/20. Unfortunately not using the CPAP, intolerant. Hoarseness,  epistaxis See by ENT 04/28/2021 due to epistaxis, hoarseness, laryngoscopy negative, symptoms likely due to reflux. Compliance: Compliance is poor due to cost, poor medical literacy;  refer him to our clinical pharmacist, encouraged him to bring all his medications and be ready to tell us what he is taking and what he is not.  Also to let us know the reason for  noncompliance (cost?  Side effects?  Etc.) Insomnia: Self increase trazodone to 2 tablets at night.  That is okay if that is helping. Preventive care: Declined flu shot. RTC 2 months      This visit occurred during the SARS-CoV-2 public health emergency.  Safety protocols were in place, including screening questions prior to the visit, additional usage of staff PPE, and extensive cleaning of exam room while  observing appropriate contact time as indicated for disinfecting solutions.

## 2021-05-08 LAB — LIPID PANEL
Cholesterol: 141 mg/dL (ref 0–200)
HDL: 41.7 mg/dL (ref >39.00)
LDL Cholesterol: 71 mg/dL (ref 0–99)
NonHDL: 99.64
Total CHOL/HDL Ratio: 3
Triglycerides: 142 mg/dL (ref 0.0–149.0)
VLDL: 28.4 mg/dL (ref 0.0–40.0)

## 2021-05-08 LAB — HEMOGLOBIN A1C: Hgb A1c MFr Bld: 6.7 % — ABNORMAL HIGH (ref 4.6–6.5)

## 2021-05-08 LAB — TSH: TSH: 1.08 u[IU]/mL (ref 0.35–5.50)

## 2021-05-08 NOTE — Assessment & Plan Note (Signed)
High cholesterol: See phone note from 04/30/2021 Unclear if he is taking Zetia or fenofibrate.  Apparently he takes Lipitor daily. He is unable to afford Repatha. Plan: Check a lipid panel, he is not fasting today.  Encouraged to take Lipitor, Zetia and fenofibrate. DM: No recent eye exam, referral sent Currently on no DM medications.  Check A1c HTN: BP was elevated when he saw cardiology 01/28/2021, they were considering med adjustment. BP today is good, reports normal ambulatory BPs.  Last BMP okay. Continue Lasix, Imdur, metoprolol, Aldactone, valsartan. CHF, CAD: Seen by cardiology 01/28/2021.  Echo showed normal LV function.  Moderate pulmonary hypertension.  Was slightly volume overloaded. They rec to continue furosemide.   Currently not taking aspirin due to recent nosebleeds, strongly recommend to go back on it, if nosebleeds return he will let me know; reports good compliance with Plavix OSA, hypoxemia, COPD 01/28/2021: Saw pulmonary, Rx CPAP 01/28/2021, Rx Symbicort.  Last visit with pulmonary 03/31/2021, they noted that cough improved after they stopped ACE inhibitor's 11/07/20. Unfortunately not using the CPAP, intolerant. Hoarseness,  epistaxis See by ENT 04/28/2021 due to epistaxis, hoarseness, laryngoscopy negative, symptoms likely due to reflux. Compliance: Compliance is poor due to cost, poor medical literacy;  refer him to our clinical pharmacist, encouraged him to bring all his medications and be ready to tell us what he is taking and what he is not.  Also to let us know the reason for noncompliance (cost?  Side effects?  Etc.) Insomnia: Self increase trazodone to 2 tablets at night.  That is okay if that is helping. Preventive care: Declined flu shot. RTC 2 months

## 2021-05-09 DIAGNOSIS — I25118 Atherosclerotic heart disease of native coronary artery with other forms of angina pectoris: Secondary | ICD-10-CM

## 2021-05-09 DIAGNOSIS — I5032 Chronic diastolic (congestive) heart failure: Secondary | ICD-10-CM | POA: Diagnosis not present

## 2021-05-09 DIAGNOSIS — I10 Essential (primary) hypertension: Secondary | ICD-10-CM | POA: Diagnosis not present

## 2021-05-09 DIAGNOSIS — E1151 Type 2 diabetes mellitus with diabetic peripheral angiopathy without gangrene: Secondary | ICD-10-CM | POA: Diagnosis not present

## 2021-05-14 ENCOUNTER — Telehealth: Payer: Self-pay | Admitting: Pharmacist

## 2021-05-14 ENCOUNTER — Telehealth: Payer: Medicare HMO

## 2021-05-14 NOTE — Telephone Encounter (Signed)
Attempted out reach for follow up Chronic Care Management visit. No answer. LM on VM with clinical pharmacist's contact information [phone]

## 2021-05-16 ENCOUNTER — Other Ambulatory Visit: Payer: Self-pay | Admitting: Cardiovascular Disease

## 2021-05-20 ENCOUNTER — Telehealth: Payer: Self-pay | Admitting: Internal Medicine

## 2021-05-20 NOTE — Telephone Encounter (Signed)
   Telephone encounter was:  Unsuccessful.  05/20/2021 Name: [name] MRN: [id number] DOB: July 08, 1961  Unsuccessful outbound call made today to assist with:  Transportation Needs   Outreach Attempt:  2nd Attempt  A HIPAA compliant voice message was left requesting a return call.  Instructed patient to call back at [phone].  April Green Care Guide, Embedded Care Coordination [place], Care Management Phone: [phone] Email: [email or web address]

## 2021-05-21 ENCOUNTER — Ambulatory Visit (INDEPENDENT_AMBULATORY_CARE_PROVIDER_SITE_OTHER): Payer: Medicare HMO | Admitting: Pharmacist

## 2021-05-21 ENCOUNTER — Other Ambulatory Visit: Payer: Self-pay

## 2021-05-21 DIAGNOSIS — I5032 Chronic diastolic (congestive) heart failure: Secondary | ICD-10-CM

## 2021-05-21 DIAGNOSIS — I739 Peripheral vascular disease, unspecified: Secondary | ICD-10-CM

## 2021-05-21 DIAGNOSIS — E785 Hyperlipidemia, unspecified: Secondary | ICD-10-CM

## 2021-05-21 DIAGNOSIS — I10 Essential (primary) hypertension: Secondary | ICD-10-CM

## 2021-05-21 DIAGNOSIS — F17200 Nicotine dependence, unspecified, uncomplicated: Secondary | ICD-10-CM

## 2021-05-21 DIAGNOSIS — E1151 Type 2 diabetes mellitus with diabetic peripheral angiopathy without gangrene: Secondary | ICD-10-CM

## 2021-05-21 MED ORDER — BUDESONIDE-FORMOTEROL FUMARATE 160-4.5 MCG/ACT IN AERO: 2.0000 | INHALATION_SPRAY | Freq: Two times a day (BID) | RESPIRATORY_TRACT | 1 refills | Status: DC

## 2021-05-21 NOTE — Patient Instructions (Addendum)
Visit Information  PATIENT GOALS:  Goals Addressed             This Visit's Progress    Pharmacy Chronic Care Management Plan   On track    Current Barriers:  Unable to independently monitor therapeutic efficacy Unable to achieve control of Hyperlipidemia / high cholesterol Unable to maintain control of blood pressure and heart disease Does not adhere to prescribed medication regimen (improved)  Does not contact provider office for questions/concerns  Pharmacist Clinical Goal(s):  Over the next 90 days, patient will verbalize ability to afford treatment regimen achieve adherence to monitoring guidelines and medication adherence to achieve therapeutic efficacy achieve control of Hyperlipidemia as evidenced by LDL <70 maintain control of Heart disease, blood pressure and type 2 diabetes as evidenced by attaining goals listed below  adhere to prescribed medication regimen as evidenced by refill history contact provider office for questions/concerns as evidenced notation of same in electronic health record through collaboration with PharmD and provider.   Interventions: 1:1 collaboration with Colon Branch, MD regarding development and update of comprehensive plan of care as evidenced by provider attestation and co-signature Inter-disciplinary care team collaboration (see longitudinal plan of care) Comprehensive medication review performed; medication list updated in electronic medical record  Diabetes: Uncontrolled; Goal A1c < 6.5% Last A1c was 7.0% (07/10/2020) Current treatment:  None Interventions:   Counseled on A1c goal Reviewed home blood goals  Fasting blood glucose goal (before meals) = 80 to 130 Blood glucose goal after a meal = less than 180  Recommend recheck A1c with next labs. If >6.5% consider starting medication therapy  Hypertension / Heart Disease:  Goal: decreased symptoms of heart disease, prevent exacerbations and BP <140/90.  Current  treatment: Furosemide 20mg  - take 3 tablets = 60mg  daily in the morning Metoprolol tartrate 25mg  twice a day Spironolactone 25mg  - take 0.5 tablet = 12.5mg  each morning Valsartan 80mg  daily Interventions:   Reviewed blood pressure goal Continue to check blood pressure 2 to 3 times a week, record and bring to future appointments. Discussed signs and symptoms of worsening heart disease weight gain, shortness of breath, abdominal fullness, swelling in legs or abdomen, Fatigue and weakness, changes in ability to perform usual activities, persistent cough or wheezing with white or pink blood-tinged mucus, nausea and lack of appetite. Contact cardiologist or primary care physician if you experience any of these symptoms.  Continue to check weight every day. Report weight gain of more than 3 lbs in 24 hours or 5 lbs in 1 week.   Hyperlipidemia with elevated triglycerides / history of stroke: LDL goal <55; Triglycerides goal <150 Last LDL was 70  Current treatment: Atorvastatin 80mg  daily Ezetimibe 10mg  daily  Fenofibrate 145mg  daily Aspirin 81mg  daily with breakfast (has not taken recently Clopidogrel 75mg  daily Isosorbide ER 60mg  0 take 1.5 tablets = 90mg  daily Nitroglycerin 0.4mg  - place 1 tablet under the tongue and allow to dissolve as needed for chest pain Interventions:  Education provided about LDL and triglyceride goal Discussed benefits of getting LDL to goal and taking atorvastatin daily for worsening of heart disease / stroke Recommend recheck lipid panel with next labs.  Continue current medications listed above for heart health / cholesterol lowering / stroke prevention   Chronic Obstructive Pulmonary Disease / sleep apnea: Current treatment: CPAP Symbicort (budesonide/formoterol) 160/4.5 - inhale 2 puffs into lungs twice a day Interventions:  Continue to follow up with pulmonologist  Use Symbicort - 2 puffs twice a day Remember to  rinse mouth after using Symbicort.   Discontinue smoking or at least decrease number of cigarettes smoked per day.  I am checking with Dr [name] about starting bupropion to help you quit smoking.   Tobacco Use disorder:   Interventions:  Started bupropion ER 150mg  daily for 3 days. Then increase to 150mg  twice a day thereafter.    Medication Management:  Goal: take medications as prescribed; consult clinical pharmacist if any questions or issues with medication access Current Pharmacy:  locally uses [facility].  [facility] Interventions:  Continue using either color stickers or color pill containers to help patient identify morning, afternoon, evening and night medications Reviewed each medication with patient and discussed how to take and reason for taking.   Patient Goals/Self-Care Activities Over the next 90 days, patient will:  Take medications as prescribed, focus on medication adherence by utilizing mail order pharmacy and contacting clinical pharmacist about any medications questions or concerns Check blood pressure 2 to 3 times per week, document, and provide at future appointments,  Weigh daily, and contact provider if weight gain of more than 3 lbs in 24 hours or 5 lbs in 1 week Collaborate with provider on medication access solutions We applies for Extra Assistance from Medicare today - please let me know when you receive a letter with either approval or denial.   Follow Up Plan: Telephone follow up appointment with care management team member scheduled for 1 month         Patient verbalizes understanding of instructions provided today and agrees to view in [place].   Telephone follow up appointment with care management team member scheduled for: 2 to 4 weeks  Cherre Robins, PharmD Clinical Pharmacist [facility] [place] [facility]

## 2021-05-21 NOTE — Chronic Care Management (AMB) (Signed)
//   Chronic Care Management Pharmacy Note  05/21/2021 Name:  [name]. MRN:  [id number] DOB:  16-Dec-1960  Summary:  Assisted patient in requesting refills for pantoprazole, trazodone.  Updated Rx with [facility] for trazodone since dose was increased to 151m daily  Rx for bupropion ER 1575mdaily for 3 days then twice a [name] for smoking cessation started.   Subjective: ChGala Romneyis an [age].o. year old male who is a primary patient of [name], JoAlda BertholdMD.  The CCM team was consulted for assistance with disease management and care coordination needs.    Engaged with patient by telephone for follow up visit in response to provider referral for pharmacy case management and/or care coordination services.   Consent to Services:  The patient was given information about Chronic Care Management services, agreed to services, and gave verbal consent prior to initiation of services.  Please see initial visit note for detailed documentation.   Patient Care Team: PaColon BranchMD as PCP - General (Internal Medicine) Kenneth BlanksMD as PCP - Cardiology (Cardiology) HoRalene BatheMD as Consulting Physician (Ophthalmology) Pyrtle, Kenneth LinesMD as Consulting Physician (Gastroenterology) BeSusa DayMD as Consulting Physician (Orthopedic Surgery) WaLuretha RuedRN as Case Manager EcCherre RobinsPharmD (Pharmacist) NeRozetta NunneryMD as Consulting Physician (Otolaryngology)  Recent office visits: 05/07/2021 - PCP (Dr PaLarose Kellsfollow up chronic conditions. Increased trazodone to take 10043mt bedtime Discontinued hydrocodone/ APAP from med list - patient not taking. 10/22/2020 - PCP (Dr PazLarose Kells F/U chronic conditions. Removed Repatha from med list as pt reported no longer taking due to cost. Patient also was not taking metformin - new Rx sent. Recommended restart trazodone for depression / insomnia. Referral for outpatient speach therapy.   Recent  consult visits: 04/28/2021 - Orolaryngology (Dr NewLucia Gaskinseen for epistaxis and hoarseness. No identifiable cause of right sided epistaxis. Recommended use Afrin on a cottonball to stop any nosebleed. No cause of hoarseness identified, possibly reflux. No med changes noted. 03/31/2021 - Pulmonology (Dr WerMelvyn Novas/U COPD. No medication changes.  PFT's  03/31/2021  FEV1 1.93 (53 % ) ratio 0.70  p 13 % improvement from saba p 0 prior to study with   FV curve no signficant curvature. 03/10/2021 - Pulm (GrElie ConferP) Phone Visit - Lung Cancer Screeing education session. CT scheduled.  01/28/2021 - Pulm (Parrett, NP) F/U cough OSA and SOB. Suspects underlying COPD - ordered PFTs. Recommended restart CPAP. Ordered DME to change CPAP to Auto set 10-20. 01/28/2021 [name]U CHFpEF. Stopped potassium supplement; Started spironolacton 2m21mtake 0.5 tablet daily. Consider SGLT2 or Entresto in future.   Hospital visits: 04/11/2021 - ED for abdominal pain - no medication changes noted.  Objective:  Lab Results  Component Value Date   CREATININE 1.02 04/11/2021   CREATININE 1.02 02/19/2021   CREATININE 0.94 02/12/2021    Lab Results  Component Value Date   HGBA1C 6.7 (H) 05/07/2021   Last diabetic Eye exam: No results found for: HMDIABEYEEXA  Last diabetic Foot exam: No results found for: HMDIABFOOTEX      Component Value Date/Time   CHOL 141 05/07/2021 1347   CHOL 171 11/14/2019 1441   TRIG 142.0 05/07/2021 1347   HDL 41.70 05/07/2021 1347   HDL 37 (L) 11/14/2019 1441   CHOLHDL 3 05/07/2021 1347   VLDL 28.4 05/07/2021 1347   LDLCALC 71 05/07/2021 1347   LDLCALC 116 (H) 11/14/2019 1441  LDLDIRECT 116.0 05/12/2013 1517    Hepatic Function Latest Ref Rng & Units 04/11/2021 07/10/2020 11/26/2018  Total Protein 6.5 - 8.1 g/dL 7.9 7.1 7.8  Albumin 3.5 - 5.0 g/dL 4.3 4.0 3.9  AST 15 - 41 U/L '18 12 22  ' ALT 0 - 44 U/L '15 13 19  ' Alk Phosphatase 38 - 126 U/L 66 135(H) 88  Total Bilirubin  0.3 - 1.2 mg/dL 1.5(H) 1.2 2.1(H)  Bilirubin, Direct 0.0 - 0.3 mg/dL - - -    Lab Results  Component Value Date/Time   TSH 1.895 07/07/2016 06:31 PM   TSH 1.88 05/05/2013 12:14 PM   TSH 2.290 **Test methodology is 3rd generation TSH** 06/23/2009 07:55 AM    CBC Latest Ref Rng & Units 04/11/2021 12/27/2019 11/26/2018  WBC 4.0 - 10.5 K/uL 11.0(H) 7.4 7.7  Hemoglobin 13.0 - 17.0 g/dL 15.2 15.5 17.4(H)  Hematocrit 39.0 - 52.0 % 45.9 47.6 53.7(H)  Platelets 150 - 400 K/uL 211 221.0 165    Lab Results  Component Value Date/Time   VD25OH 10.8 (L) 11/03/2016 02:53 PM    Clinical ASCVD: Yes  The ASCVD Risk score (Arnett DK, et al., 2019) failed to calculate for the following reasons:   The patient has a prior MI or stroke diagnosis     Social History   Tobacco Use  Smoking Status Every [name]   Packs/[name]: 0.50   Years: 45.00   Pack years: 22.50   Types: Cigarettes   Start date: 1976  Smokeless Tobacco Never  Tobacco Comments   1 pack daily-03/10/21   BP Readings from Last 3 Encounters:  05/07/21 116/72  04/11/21 135/78  03/31/21 118/66   Pulse Readings from Last 3 Encounters:  05/07/21 62  04/11/21 65  03/31/21 (!) 56   Wt Readings from Last 3 Encounters:  05/07/21 216 lb 8 oz (98.2 kg)  04/11/21 221 lb 12.5 oz (100.6 kg)  03/31/21 222 lb (100.7 kg)    Assessment: Review of patient past medical history, allergies, medications, health status, including review of consultants reports, laboratory and other test data, was performed as part of comprehensive evaluation and provision of chronic care management services.   SDOH:  (Social Determinants of Health) assessments and interventions performed:      CCM Care Plan  No Known Allergies  Medications Reviewed Today     Reviewed by Cherre Robins, PharmD (Pharmacist) on 05/21/21 at 26  Med List Status: <None>   Medication Order Taking? Sig Documenting Provider Last Dose Status Informant  aspirin EC 81 MG EC tablet  [id number] Yes Take 1 tablet (81 mg total) by mouth daily with breakfast. Roxan Hockey, MD Taking Active   atorvastatin (LIPITOR) 80 MG tablet [id number] Yes TAKE 1 TABLET (80 MG TOTAL) BY MOUTH DAILY AT 6 PM. NEED TO KEEP APPOINTMENT FOR ANY FUTURE REFILLS. Burnell Blanks, MD Taking Active   budesonide-formoterol [facility]) 160-4.5 MCG/ACT inhaler [id number] No Inhale 2 puffs into the lungs 2 (two) times daily.  Patient not taking: Reported on 05/21/2021   Kenneth Branch, MD Not Taking Active   buPROPion (ZYBAN) 150 MG 12 hr tablet [id number] Yes Take 150 mg by mouth 2 (two) times daily. (Take 19m each morning for 3 days, then increase to 1536mtwice a [name]) [provider]  Active   clopidogrel (PLAVIX) 75 MG tablet [id number] TAKE 1 TABLET EVERY [name] Kenneth BlanksMD Taking Active   ezetimibe (ZETIA) 10 MG tablet [id number] Take 1 tablet (10 mg  total) by mouth daily. Burnell Blanks, MD Taking Active   fenofibrate (TRICOR) 145 MG tablet [id number] Yes Take 1 tablet (145 mg total) by mouth daily. Kenneth Branch, MD Taking Active   folic acid (FOLVITE) 1 MG tablet [id number] Yes Take 1 tablet (1 mg total) by mouth daily. Roxan Hockey, MD Taking Active   furosemide (LASIX) 20 MG tablet [id number] Yes Take 3 tablets (60 mg total) by mouth daily. Burnell Blanks, MD Taking Active   isosorbide mononitrate (IMDUR) 60 MG 24 hr tablet [id number] Yes Take 1.5 tablets (90 mg total) by mouth daily. Burnell Blanks, MD Taking Active   metoprolol tartrate (LOPRESSOR) 25 MG tablet [id number] Yes TAKE 1 TABLET TWICE DAILY Burnell Blanks, MD Taking Active   nitroGLYCERIN (NITROSTAT) 0.4 MG SL tablet [id number] Yes Place 1 tablet (0.4 mg total) under the tongue every 5 (five) minutes x 3 doses as needed for chest pain. Kenneth Branch, MD Taking Active            Med Note Alinda Money, Lester Kinsman May 07, 2021 12:59 PM) PRN  pantoprazole (PROTONIX) 40 MG tablet  [id number] Yes TAKE 1 TABLET TWICE DAILY Burnell Blanks, MD Taking Active   spironolactone (ALDACTONE) 25 MG tablet [id number] Yes Take 0.5 tablets (12.5 mg total) by mouth daily. Burnell Blanks, MD Taking Active   tamsulosin [facility]) 0.4 MG CAPS capsule [id number] Yes Take 0.4 mg by mouth at bedtime. [provider] Taking Active   thiamine 100 MG tablet [id number] No Take 1 tablet (100 mg total) by mouth daily.  Patient not taking: Reported on 05/21/2021   Roxan Hockey, MD Not Taking Active   traZODone (DESYREL) 100 MG tablet [id number] Yes Take 2 tablets (100 mg total) by mouth at bedtime. Kenneth Branch, MD Taking Active   valsartan (DIOVAN) 80 MG tablet [id number] Yes Take 1 tablet (80 mg total) by mouth daily. Burnell Blanks, MD Taking Active             Patient Active Problem List   Diagnosis Date Noted   COPD 0 AB 03/31/2021   (HFpEF) heart failure with preserved ejection fraction (HCC)    DOE (dyspnea on exertion) 11/07/2020   Diabetes ([place]) 03/18/2020   Prolapsed lumbar disc 01/25/2020   Closed fracture of lateral malleolus 01/25/2020   Hiatal hernia    Hepatic steatosis    Hemianopia, homonymous, right    ETOH abuse    Esophagitis    Chronic diastolic CHF (congestive heart failure) ([place])    Chronic bronchitis ([place])    CAD (coronary artery disease)    PAD (peripheral artery disease) ([place]) 09/23/2016   Morbid obesity ([place])    Chronic pain syndrome 10/23/2015   GERD (gastroesophageal reflux disease) 08/07/2015   Pancreatitis, acute    PCP NOTES >>>>> 06/01/2015   Annual physical exam 05/31/2015   Acute ischemic stroke ([place]) 02/20/2015   Hx of cocaine abuse ([place]) 02/20/2015   BPH (benign prostatic hyperplasia) 02/20/2015   OSA (obstructive sleep apnea) ?? 05/05/2013   Noncompliance 05/05/2013   Anxiety and depression 05/05/2013   Erectile dysfunction 03/17/2012   Emphysema (subcutaneous) (surgical) resulting from a procedure  10/08/2008   Iron deficiency anemia 08/10/2008   ABNORMALITY OF GAIT 08/06/2008   Cigarette smoker 07/27/2008   Dyslipidemia 07/26/2008   Essential hypertension 07/26/2008   ST elevation myocardial infarction involving left circumflex coronary artery ([place]) 07/26/2008    Immunization History  Administered Date(s) Administered  PFIZER(Purple Top)SARS-COV-2 Vaccination 11/17/2019, 12/11/2019, 07/18/2020   Pneumococcal Polysaccharide-23 11/03/2016   Tdap 11/03/2016    Conditions to be addressed/monitored: CHF, CAD, HTN, HLD, Hypertriglyceridemia, COPD, DMII, and BPH; OSA; GERD; fatty liver; h/o strokePAD; sleep disturbance; anxiety/ depression  Care Plan : Pharmacy Care Plan  Updates made by Cherre Robins, PHARMD since 05/21/2021 12:00 AM     Problem: HTN; CHF: CAD; GERD with esophagitis; depression; BPH; COPD; bronchitis; stroke; elevated TG and hyperlipidemia; type 2 DM with h/o pancreatitis   Priority: High  Onset Date: 03/18/2021     Long-Range Goal: Management of chronic conditions and medications   Start Date: 03/18/2021  Priority: High  Note:   Current Barriers:  Unable to independently monitor therapeutic efficacy Unable to achieve control of Hyperlipidemia  Unable to maintain control of HTN, CHF Does not adhere to prescribed medication regimen Does not contact provider office for questions/concerns  Pharmacist Clinical Goal(s):  Over the next 90 days, patient will verbalize ability to afford treatment regimen achieve adherence to monitoring guidelines and medication adherence to achieve therapeutic efficacy achieve control of Hyperlipidemia as evidenced by LDL <70 maintain control of CHF, HTN and type 2 DM as evidenced by attaining goals listed below  adhere to prescribed medication regimen as evidenced by refill history contact provider office for questions/concerns as evidenced notation of same in electronic health record through collaboration with PharmD and  provider.   Interventions: 1:1 collaboration with Kenneth Branch, MD regarding development and update of comprehensive plan of care as evidenced by provider attestation and co-signature Inter-disciplinary care team collaboration (see longitudinal plan of care) Comprehensive medication review performed; medication list updated in electronic medical record  Diabetes: Improving; Goal A1c < 6.5% Last A1c was 6.7 on 05/07/2021 - down form 7.0% (07/10/2020) Current treatment:  None Tried metformin in past but stopped due to GI side effects (tried 56m/[name] and 2524mday) History of pancreatitis (possibly related to alcohol use)   Current glucose readings: does not currently check BG at home Denies hypoglycemic/hyperglycemic symptoms Current meal patterns: over the last 2 to 3 weeks has been limiting bread and sweets Current exercise: none currently Interventions: (addressed at previous visits)   Counseled on A1c and BG goals ,  Continue to monitor A1c if remains >6.5% consider starting SGLT2  Hypertension / CHF: Improving; goal: decrease symptoms of CHF, prevent exacerbations and BP <140/90.  Current treatment: Furosemide 2045m take 3 tablets = 77m45mily in the morning Metoprolol tartrate 25mg91mce a [name] Spironolactone 25mg 80mke 0.5 tablet = 12.5mg ea27mmorning Valsartan 80mg da42mReceived scales and has been checking weight at home. Weight has been stable at 211 to 212 lbs.  EF = 55-60% (01/10/2033/19/62222 systolic dysfunction NYHA 2b Past medications tried: lisinopril 10mg - c8me to valsartan by pulmonologist.  Denies hypotensive/hypertensive symptoms Interventions:  Reviewed blood pressure goals Discussed signs and symptoms of CHF exacerbation - weight gain, SOB, abdominal fullness, swelling in legs or abdomen, Fatigue and weakness, changes in ability to perform usual activities, persistent cough or wheezing with white or pink blood-tinged mucus, nausea and lack of  appetite Continue to weigh daily - report weight gain of more than 3 lbs in 24 hours or 5 lbs in 1 week.   Hyperlipidemia / CAD / history of stroke / PAD: Uncontrolled but improving; LDL goal <55 (per cardio) ; Tg goal <150 Last LDL decreased from 116 to 71 (05/07/2021)  Current treatment: Atorvastatin 80mg dail44metimibe 10mg daily26mdded 05/07/2021  Fenofibrate 125m daily Aspirin 856mdaily with breakfast  Clopidogrel 7553maily Isosorbide ER 58m75mtake 1.5 tablets = 90mg74mly Nitroglycerin 0.4mg -31mace 1 tablet under the tongue and allow to dissolve as needed for chest pain Medications previously tried: Repatha - stopped due to cost; Brilenta  - stopped due to shortness of breath Patient reports he has restarted aspirin. He has not had a nosebleed in the last 2 weeks Interventions:  Continue aspirin 81mg d53m. Follow up as planned with ENT Continue current regimen for lipid lowering. Recheck lipids in next 8 to 12 weeks.   Chronic Obstructive Pulmonary Disease / OSA: Controlled Current treatment: CPAP Symbicort (budesonide/formoterol) 160/4.5 - inhale 2 puffs into lungs twice a [name] Managed by pulmonologist - Dr Wert PaMelvyn Novast reports he has not been using Symbicort daily - does not feel that he needs it. Denies shortness of breath  PFT's  03/31/2021  FEV1 1.93 (53 % ) ratio 0.70  p 13 % improvement from saba p 0 prior to study with   FV curve no signficant curvature Previous medications tried: Spiriva - not effective Interventions:  Continue to follow up with pulmonologist  Discussed that Symbicort was for healing his lungs / for maintenance.  Reminded patient to rinse mouth after using Symbicort.   Tobacco Use disorder:  Patient has restarted smoking after quitting for about 4 months.  Reports smoking 1ppd Patient states he wants to quit.  Past therapies tried: nicotine patches (not effective) ; Chantix (not helpful); Cold turkey [place].  Interventions:  Started  bupropion ER 150mg da45mfor 3 days. Then increase to 150mg twi77m [name] thereafter.    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 Sometimes has difficulty reading labels on his prescription bottles.  His sister, Christy iAlyse Lowng patient with medications as she is able (she [place] live in [place] able to see him frequently)  Patient has started using colored stickers to help him know which medications he is to take in morning, mid [name] and evening. Report this is working well.  Interventions: (at previous visit)  Called [facility] order and updated Rx for Trazodone - increase to 100mg dail34m 05/07/21. Also checked refill dates for maintenance medications. Patient is filling medications on time.  Reviewed each medication with patient and discussed how to take and reason for taking.  Reviewed refill history and patient should have all needed medications at this time. Will be due to reorder some meds at the end of September. [facility]   Patient Goals/Self-Care Activities Over the next 90 days, patient will:  take medications as prescribed, focus on medication adherence by utilizing mail order pharmacy and contacting clinical pharmacist about any medications questions or concerns, check blood pressure 2 to 3 times per week, document, and provide at future appointments, weigh daily, and contact provider if weight gain of more than 3 lbs in 24 hours or 5 lbs in 1 week, and collaborate with provider on medication access solutions  Follow Up Plan: Telephone follow up appointment with care management team member scheduled for:  4 weeks.  Medication Assistance:  none at this time but might need assistance if additional lipid lowering therapy needed in future.  Applied for LIS 2 weeks ago  Patient's preferred pharmacy is:  [facility], [address] [place] [address] Phone: [phone] Fax: [phone]  [facility], [address] [place] [address] Phone: [phone] Fax: [phone]  Uses pill box? No - patient has declined to use pill container but he is starting to use stickers on bottles to help identify time of [name] to take each medication  Follow Up:  Patient agrees to Care Plan and Follow-up.  Plan: follow up phone call with clinical pharmacist planned for 3 to 4 weeks.  Cherre Robins, PharmD Clinical Pharmacist [place] [facility]

## 2021-05-22 ENCOUNTER — Telehealth: Payer: Self-pay | Admitting: Internal Medicine

## 2021-05-22 ENCOUNTER — Other Ambulatory Visit: Payer: Self-pay

## 2021-05-22 MED ORDER — TRAZODONE HCL 100 MG PO TABS: 100.0000 mg | ORAL_TABLET | Freq: Every day | ORAL | 1 refills | Status: DC

## 2021-05-22 NOTE — Telephone Encounter (Signed)
   Telephone encounter was:  Successful.  05/22/2021 Name: [name]. MRN: [id number] DOB: 1961/04/01  [name]. is a 60 y.o. year old male who is a primary care patient of [name], [name], MD . The community resource team was consulted for assistance with Transportation Needs   Care guide performed the following interventions: Patient provided with information about care guide support team and interviewed to confirm resource needs Discussed resources to assist with transportation. Patient says he will be getting his car back from the shop on Saturday. I gave him the number to [facility] to use if needed in the future .  Follow Up Plan:  No further follow up planned at this time. The patient has been provided with needed resources.  April Green Care Guide, Embedded Care Coordination , Care Management Phone: [phone] Email: [email or web address]

## 2021-05-26 ENCOUNTER — Telehealth: Payer: Self-pay | Admitting: Cardiovascular Disease

## 2021-05-26 NOTE — Telephone Encounter (Signed)
Spoke with pt's sister, Alyse Low, [place] and advised of [place] comments as below.  Pt's sister verbalizes understanding and agrees with current plan to continue all 3 lipid medications.

## 2021-05-26 NOTE — Telephone Encounter (Signed)
Pt c/o medication issue:  1. Name of Medication:  atorvastatin (LIPITOR) 80 MG tablet fenofibrate (TRICOR) 145 MG tablet  2. How are you currently taking this medication (dosage and times per day)? 1 tablet daily of both  3. Are you having a reaction (difficulty breathing--STAT)? no  4. What is your medication issue? Patient's sister states the patient is on 2 cholesterol medications. She says at his PCP, Dr. Ethel Rana office he had lab work that came back good, but then he prescribed zetia as well. She says she is wondering if he can go off one of the other cholesterol medications, because she does not think he should be on 3 medication.

## 2021-05-26 NOTE — Telephone Encounter (Addendum)
Lipid panel done 05/07/21 TC 141, HLD 41, LDL 71, TG 142 (on atorva80 + fenofibrate 145)  The patient has severe triple vessel CAD with PMH of CABG x 3 and prior STEMI in 10/2008, NSTEMI in 06/2009, and another STEMI in 06/2016. He also has HFpEF and based upon his age with prior ASCVD events before the age of 35  (premature ASCVD) his goal LDL is < 55 mg/dL. While his LDL of 71 is good we still want to get him to a lower LDL to decrease his risk of another cardiac event. He is taking the fenofibrate to keep his triglycerides at goal and the atorvastatin and Zetia to keep his LDL at goal. He should continue all 3 lipid meds.

## 2021-05-27 DIAGNOSIS — J449 Chronic obstructive pulmonary disease, unspecified: Secondary | ICD-10-CM | POA: Diagnosis not present

## 2021-05-27 DIAGNOSIS — J9611 Chronic respiratory failure with hypoxia: Secondary | ICD-10-CM | POA: Diagnosis not present

## 2021-06-03 ENCOUNTER — Ambulatory Visit: Payer: Medicare HMO

## 2021-06-03 DIAGNOSIS — J449 Chronic obstructive pulmonary disease, unspecified: Secondary | ICD-10-CM

## 2021-06-03 DIAGNOSIS — I10 Essential (primary) hypertension: Secondary | ICD-10-CM

## 2021-06-03 DIAGNOSIS — I5032 Chronic diastolic (congestive) heart failure: Secondary | ICD-10-CM

## 2021-06-03 DIAGNOSIS — F17210 Nicotine dependence, cigarettes, uncomplicated: Secondary | ICD-10-CM

## 2021-06-03 NOTE — Patient Instructions (Addendum)
Visit Information: Thank you for taking the time so speak with me today. Below are the goals we discussed today.  PATIENT GOALS:  Goals Addressed             This Visit's Progress    Track and Manage my Chronic Health Conditions   On track    Timeframe:  Long-Range Goal Priority:  High Start Date:  04/07/21                           Expected End Date:  10/07/21                     Follow Up Date 07/01/21  Patient Goals: - Contact your cardiologist to discuss your concerns regarding Furosemide and dosage.  - notify your provider with any questions or concern regarding your health. - continue to attend provider visits as scheduled. - continue to weigh and record weights daily. Take them to your provider visits. - know when to call the doctor. signs/symptoms of worsening: weight gain of 3 pounds overnight, increased swelling in stomach, feet/legs or hands. Increased shortness of breath, having less energy than normal, new or worsening dizziness, if it is harder for you to breathe when lying down and you need to sit up to breathe or you feel that something is not right.  - continue to eat healthy: whole grains, fruits and vegetables, lean meats and healthy fats. Monitor salt intake. - track symptoms and what helps feel better or worse. - contact your doctor, clinical pharmacist or Nurse Case Manager if you would like resources/assistance to help with quitting smoking.       Heart Failure, Diagnosis Heart failure means that your heart is not able to pump blood in the right way. This makes it hard for your body to work well. Heart failure is usually a long-term (chronic) condition. You must take good care of yourself and follow your treatment plan from your doctor. What are the causes? High blood pressure. Buildup of cholesterol and fat in the arteries. Heart attack. This injures the heart muscle. Heart valves that do not open and close properly. Damage of the heart muscle. This is also  called cardiomyopathy. Infection of the heart muscle. This is also called myocarditis. Lung disease. What increases the risk? Getting older. The risk of heart failure goes up as a person ages. Being overweight. Being male. Use tobacco or nicotine products. Abusing alcohol or drugs. Having taken medicines that can damage the heart. Having any of these conditions: Diabetes. Abnormal heart rhythms. Thyroid problems. Low blood counts (anemia). Having a family history of heart failure. What are the signs or symptoms? Shortness of breath. Coughing. Swelling of the feet, ankles, legs, or belly. Losing or gaining weight for no reason. Trouble breathing. Waking from sleep because of the need to sit up and get more air. Fast heartbeat. Being very tired. Feeling dizzy, or feeling like you may pass out (faint). Having no desire to eat. Feeling like you may vomit (nauseous). Peeing (urinating) more at night. Feeling confused. How is this treated? This condition may be treated with: Medicines. These can be given to treat blood pressure and to make the heart muscles stronger. Changes in your daily life. These may include: Eating a healthy diet. Staying at a healthy body weight. Quitting tobacco, alcohol, and drug use. Doing exercises. Participating in a cardiac rehabilitation program. This program helps you improve your health through exercise, education,  and counseling. Surgery. Surgery can be done to open blocked valves, or to put devices in the heart, such as pacemakers. A donor heart (heart transplant). You will receive a healthy heart from a donor. Follow these instructions at home: Treat other conditions as told by your doctor. These may include high blood pressure, diabetes, thyroid disease, or abnormal heart rhythms. Learn as much as you can about heart failure. Get support as you need it. Keep all follow-up visits. Summary Heart failure means that your heart is not able to  pump blood in the right way. This condition is often caused by high blood pressure, heart attack, or damage of the heart muscle. Symptoms of this condition include shortness of breath and swelling of the feet, ankles, legs, or belly. You may also feel very tired or feel like you may vomit. You may be treated with medicines, surgery, or changes in your daily life. Treat other health conditions as told by your doctor. This information is not intended to replace advice given to you by your health care provider. Make sure you discuss any questions you have with your health care provider. Document Revised: 02/17/2020 Document Reviewed: 02/17/2020 Elsevier Patient Education  2022 [place].   Patient verbalizes understanding of instructions provided today and agrees to view in [place].   Telephone follow up appointment with care management team member scheduled for: 07/01/21 The patient has been provided with contact information for the care management team and has been advised to call with any health related questions or concerns.   Thea Silversmith, RN, MSN, BSN, CCM Care Management Coordinator [facility] [phone]

## 2021-06-03 NOTE — Chronic Care Management (AMB) (Addendum)
Chronic Care Management   CCM RN Visit Note  06/03/2021 Name: [name]. MRN: [id number] DOB: February 01, 1961  Subjective: [name]. is a 60 y.o. year old male who is a primary care patient of Colon Branch, MD. The care management team was consulted for assistance with disease management and care coordination needs.    Engaged with patient by telephone for follow up visit in response to provider referral for case management and/or care coordination services.   Consent to Services:  The patient was given information about Chronic Care Management services, agreed to services, and gave verbal consent prior to initiation of services.  Please see initial visit note for detailed documentation.   Patient agreed to services and verbal consent obtained.   Assessment: Review of patient past medical history, allergies, medications, health status, including review of consultants reports, laboratory and other test data, was performed as part of comprehensive evaluation and provision of chronic care management services.   SDOH (Social Determinants of Health) assessments and interventions performed:  SDOH Interventions    Flowsheet Row Most Recent Value  SDOH Interventions   Food Insecurity Interventions Other (Comment)  [referral to care guide]        CCM Care Plan  No Known Allergies  Outpatient Encounter Medications as of 06/03/2021  Medication Sig Note   aspirin EC 81 MG EC tablet Take 1 tablet (81 mg total) by mouth daily with breakfast.    atorvastatin (LIPITOR) 80 MG tablet TAKE 1 TABLET (80 MG TOTAL) BY MOUTH DAILY AT 6 PM. NEED TO KEEP APPOINTMENT FOR ANY FUTURE REFILLS.    budesonide-formoterol (SYMBICORT) 160-4.5 MCG/ACT inhaler Inhale 2 puffs into the lungs 2 (two) times daily.    clopidogrel (PLAVIX) 75 MG tablet TAKE 1 TABLET EVERY DAY    ezetimibe (ZETIA) 10 MG tablet Take 1 tablet (10 mg total) by mouth daily.    fenofibrate (TRICOR) 145 MG tablet Take 1 tablet  (145 mg total) by mouth daily.    folic acid (FOLVITE) 1 MG tablet Take 1 tablet (1 mg total) by mouth daily.    furosemide (LASIX) 20 MG tablet Take 3 tablets (60 mg total) by mouth daily. 06/03/2021: Reports he is taking 2 tablets daily.   isosorbide mononitrate (IMDUR) 60 MG 24 hr tablet Take 1.5 tablets (90 mg total) by mouth daily.    metoprolol tartrate (LOPRESSOR) 25 MG tablet TAKE 1 TABLET TWICE DAILY    nitroGLYCERIN (NITROSTAT) 0.4 MG SL tablet Place 1 tablet (0.4 mg total) under the tongue every 5 (five) minutes x 3 doses as needed for chest pain. 05/07/2021: PRN   pantoprazole (PROTONIX) 40 MG tablet TAKE 1 TABLET TWICE DAILY    spironolactone (ALDACTONE) 25 MG tablet Take 0.5 tablets (12.5 mg total) by mouth daily.    tamsulosin (FLOMAX) 0.4 MG CAPS capsule Take 0.4 mg by mouth at bedtime.    thiamine 100 MG tablet Take 1 tablet (100 mg total) by mouth daily.    traZODone (DESYREL) 100 MG tablet Take 1 tablet (100 mg total) by mouth at bedtime.    valsartan (DIOVAN) 80 MG tablet Take 1 tablet (80 mg total) by mouth daily.    buPROPion (ZYBAN) 150 MG 12 hr tablet Take 150 mg by mouth 2 (two) times daily. (Take 152m each morning for 3 days, then increase to 1537mtwice a day) (Patient not taking: Reported on 06/03/2021)    No facility-administered encounter medications on file as of 06/03/2021.  Patient Active Problem List   Diagnosis Date Noted   COPD 0 AB 03/31/2021   (HFpEF) heart failure with preserved ejection fraction (HCC)    DOE (dyspnea on exertion) 11/07/2020   Diabetes ([place]) 03/18/2020   Prolapsed lumbar disc 01/25/2020   Closed fracture of lateral malleolus 01/25/2020   Hiatal hernia    Hepatic steatosis    Hemianopia, homonymous, right    ETOH abuse    Esophagitis    Chronic diastolic CHF (congestive heart failure) (HCC)    Chronic bronchitis ([place])    CAD (coronary artery disease)    PAD (peripheral artery disease) ([place]) 09/23/2016   Morbid obesity ([place])     Chronic pain syndrome 10/23/2015   GERD (gastroesophageal reflux disease) 08/07/2015   Pancreatitis, acute    PCP NOTES >>>>> 06/01/2015   Annual physical exam 05/31/2015   Acute ischemic stroke ([place]) 02/20/2015   Hx of cocaine abuse ([place]) 02/20/2015   BPH (benign prostatic hyperplasia) 02/20/2015   OSA (obstructive sleep apnea) ?? 05/05/2013   Noncompliance 05/05/2013   Anxiety and depression 05/05/2013   Erectile dysfunction 03/17/2012   Emphysema (subcutaneous) (surgical) resulting from a procedure 10/08/2008   Iron deficiency anemia 08/10/2008   ABNORMALITY OF GAIT 08/06/2008   Cigarette smoker 07/27/2008   Dyslipidemia 07/26/2008   Essential hypertension 07/26/2008   ST elevation myocardial infarction involving left circumflex coronary artery ([place]) 07/26/2008    Conditions to be addressed/monitored:CHF, HTN, HLD, COPD, and Smoker  Care Plan : Cardiovascular disease  Updates made by Luretha Rued, RN since 06/03/2021 12:00 AM     Problem: Knowledge deficit related to long term care plan for self-managment of cardiovascular disease in a patient with CHF, HTN, CAD, DM, COPD, current smoker   Priority: High     Long-Range Goal: Monitor and Manage chronic disease   Start Date: 04/07/2021  Expected End Date: 10/07/2021  This Visit's Progress: On track  Recent Progress: On track  Priority: High  Note:   Objective:  Lab Results  Component Value Date   HGBA1C 6.7 (H) 05/07/2021   Lab Results  Component Value Date   CREATININE 1.02 04/11/2021   CREATININE 1.02 02/19/2021   CREATININE 0.94 02/12/2021   Lab Results  Component Value Date   EGFR 85 02/19/2021  Objective:  Last practice recorded BP readings:  BP Readings from Last 3 Encounters:  05/07/21 116/72  04/11/21 135/78  03/31/21 118/66  Current Barriers: Knowledge Deficits related to long term care plan for self-management of cardiovascular disease in a patient with CHF, HTN, COPD, DM. Mr. Malinak states he  has received scales and is weighing self daily. Weight today 212 lbs. He reports his blood pressure is "good"-Last checked at provider office.   Unable to independently manage medications-improvement noted regarding patient being able to state what medications he is taking and how much. Patient reports he has decreased furosemide to twice a day, although prescribed to take 3 tablets daily. He states 3 tablets was causing sudden urges to urinate, and he does not have these urges with two tablets daily. He has not discussed with cardiologist and states he will ask his sister to call cardiologist to discuss. Mr. Iovino denies any swelling or edema. He denies increased shortness of breath and reports his weight was 212 pounds today and have been stable. Does not attend all scheduled provider appointments Vision impairment-legally blind Transportation barriers Smoking relapse-states smoking about 1pack/day. He reports he stopped Bupropion today. Case Manager Clinical Goal(s):  patient will  verbalize understanding of plan for self-management of cardiovascular, pulmonary and diabetes.  patient will obtain scale and begin to weigh and record weights patient will verbalize understanding of Heart Failure Action Plan and when to call doctor patient will take all mediations as prescribed Interventions:  Collaboration with Colon Branch, MD regarding development and update of comprehensive plan of care as evidenced by provider attestation and co-signature Inter-disciplinary care team collaboration (see longitudinal plan of care) Evaluation of current treatment plan related to chronic disease self management and patient's adherence to plan as established by provider. Medications reviewed and encouraged patient to take as prescribed.  RNCM encouraged patient to notify cardiologist of urgency and self management of medication by decreasing furosemide to two tablets daily.  RNCM discussed strategies to help manage  urinary urgency: such as take furosemide during least busiest time of day, use of urinal which can be found at local drug store or possible over the counter catalog. RNCM reinforced with patient the purpose of furosemide and emphasized the importance of taking medications as prescribed and also importance of discussing with provider medication questions and concerns.  Upcoming appointments reviewed.  Discussed daily weights and importance in management of CHF.  Reiterated signs/symptoms of HF exacerbation and when to call the doctor. Discussed meals planning. SDOH food completed. Referral to care guide for assistance with procuring food.  Discussed services for the blind-Mr. Bruck states someone from [facility] has been on contact with him. Discussed plans with patient for ongoing care management follow up and provided patient with direct contact information for care management team. Discussed smoking cessation and offered assistance. Patient states, "I'll just quite on my own". Patient Goals: - Contact your cardiologist to discuss your concerns regarding Furosemide and dosage.  - Consider taking furosemide during your least busiest time of the day and/or obtain a urinal that can be found at your local drug store or through your insurance  Over the counter catalog.  - notify your provider with any questions or concern regarding your health. - continue to attend provider visits as scheduled. - continue to weigh and record weights daily. Take them to your provider visits. - know when to call the doctor. signs/symptoms of worsening: weight gain of 3 pounds overnight, increased swelling in stomach, feet/legs or hands. Increased shortness of breath, having less energy than normal, new or worsening dizziness, if it is harder for you to breathe when lying down and you need to sit up to breathe or you feel that something is not right.  - continue to eat healthy: whole grains, fruits and  vegetables, lean meats and healthy fats. Monitor salt intake. - track symptoms and what helps feel better or worse. - contact your doctor, clinical pharmacist or Nurse Case Manager if you would like resources/assistance to help with quitting smoking.   Plan:Telephone follow up appointment with care management team member scheduled for:  next month The patient has been provided with contact information for the care management team and has been advised to call with any health related questions or concerns.   Thea Silversmith, RN, MSN, BSN, CCM Care Management Coordinator [facility] [phone]   I have reviewed and agree with [facility] documentation.  [name], MD

## 2021-06-09 DIAGNOSIS — E1151 Type 2 diabetes mellitus with diabetic peripheral angiopathy without gangrene: Secondary | ICD-10-CM | POA: Diagnosis not present

## 2021-06-09 DIAGNOSIS — J449 Chronic obstructive pulmonary disease, unspecified: Secondary | ICD-10-CM | POA: Diagnosis not present

## 2021-06-09 DIAGNOSIS — I10 Essential (primary) hypertension: Secondary | ICD-10-CM | POA: Diagnosis not present

## 2021-06-09 DIAGNOSIS — E785 Hyperlipidemia, unspecified: Secondary | ICD-10-CM | POA: Diagnosis not present

## 2021-06-09 DIAGNOSIS — I5032 Chronic diastolic (congestive) heart failure: Secondary | ICD-10-CM

## 2021-06-10 IMAGING — RF DG SWALLOWING FUNCTION
9 series · 20 of 24 positions shown · non-contrast
Comparison: None.

CLINICAL DATA: Dysphagia, with difficulty initiating swallowing.
Hoarseness. Previous stroke.

EXAM:
MODIFIED BARIUM SWALLOW
TECHNIQUE: Different consistencies of barium were administered orally to the
patient by the Speech Pathologist. Imaging of the pharynx was
performed in the lateral projection. The radiologist was present in
the fluoroscopy room for this study, providing personal supervision.
FLUOROSCOPY TIME:  Fluoroscopy Time:  1 minutes 54 seconds
Radiation Exposure Index (if provided by the fluoroscopic device):
30.2 mGy
Number of Acquired Spot Images: 0

[Series 1: cp_standard · 0.34mm/px · 2 of 123 frames shown (1 of 9)]
[frame 19/123]
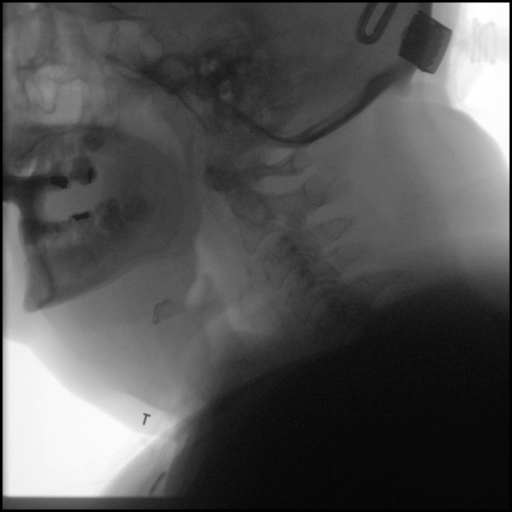
[frame 62/123]
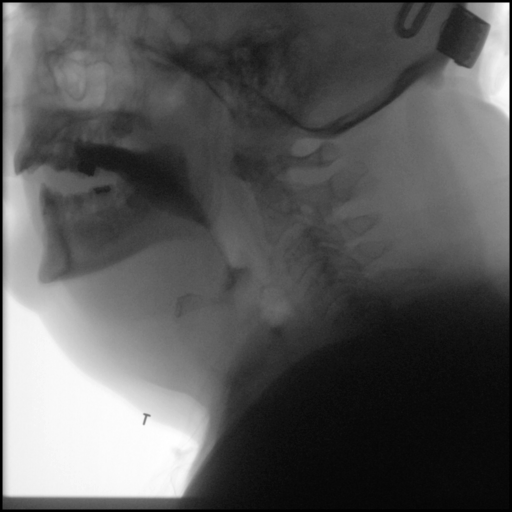

[Series 2: cp_standard · 0.34mm/px · 2 of 33 frames shown (2 of 9)]
[frame 13/33]
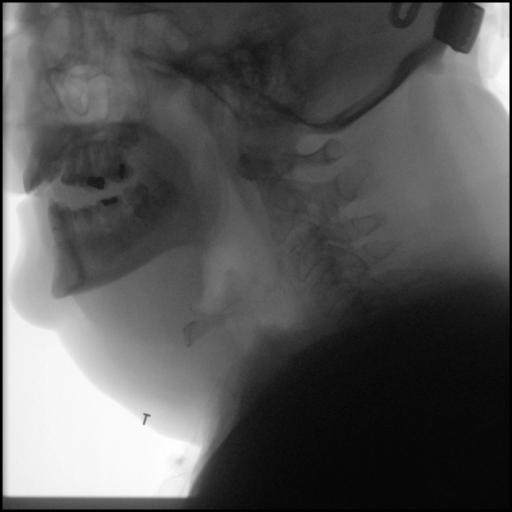
[frame 17/33]
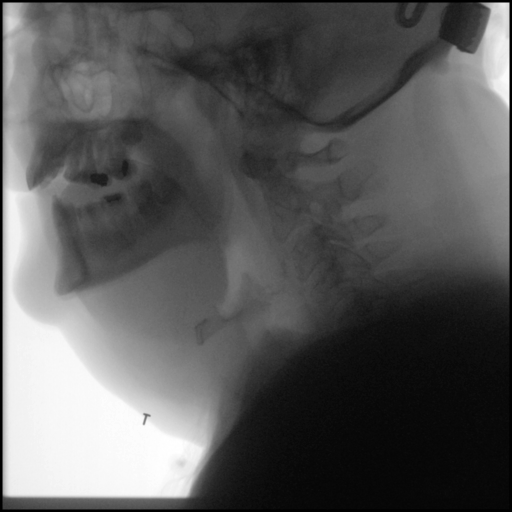

[Series 3: cp_standard · 0.34mm/px · 3 of 204 frames shown (3 of 9)]
[frame 31/204]
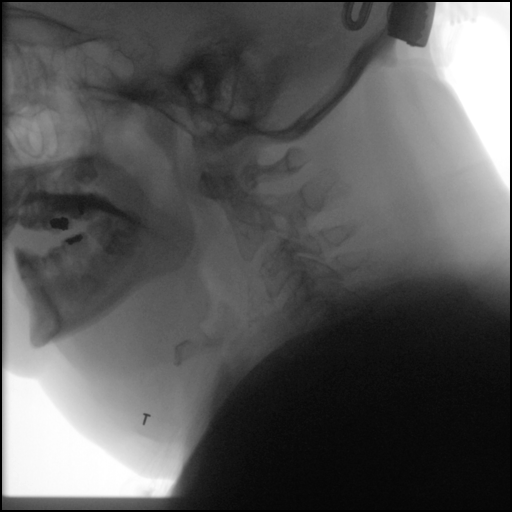
[frame 103/204]
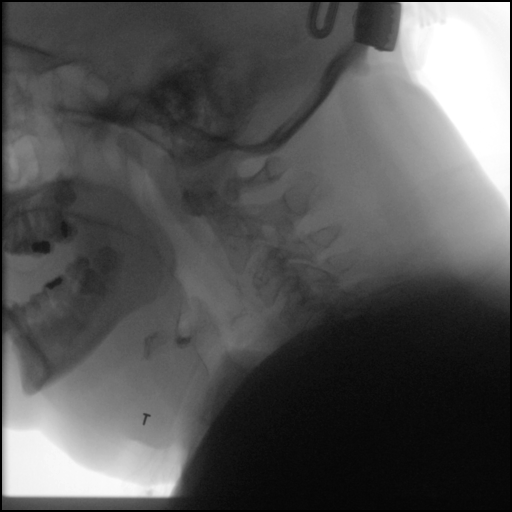
[frame 204/204]
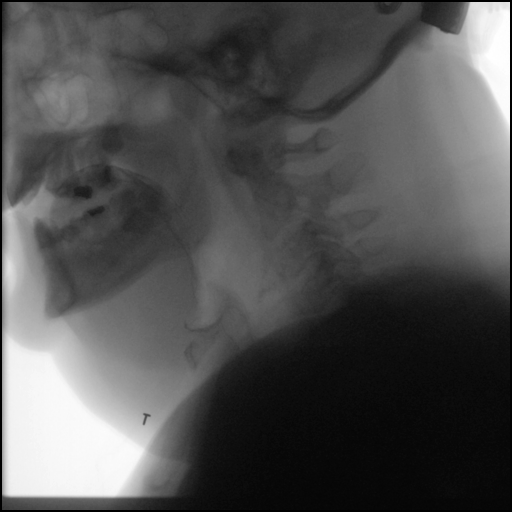

[Series 4: cp_standard · 0.34mm/px · 2 of 223 frames shown (4 of 9)]
[frame 190/223]
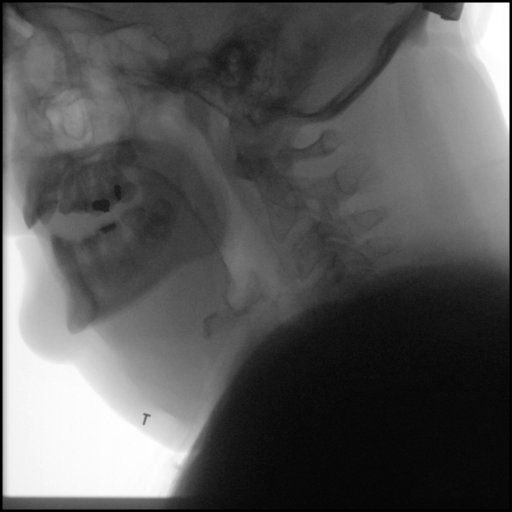
[frame 208/223]
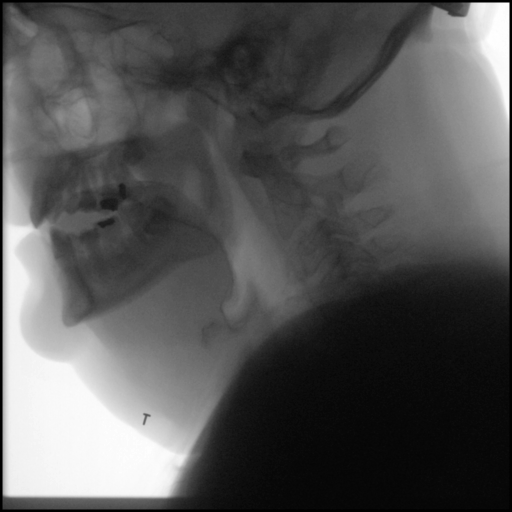

[Series 5: cp_standard · 0.34mm/px · 2 of 105 frames shown (5 of 9)]
[frame 16/105]
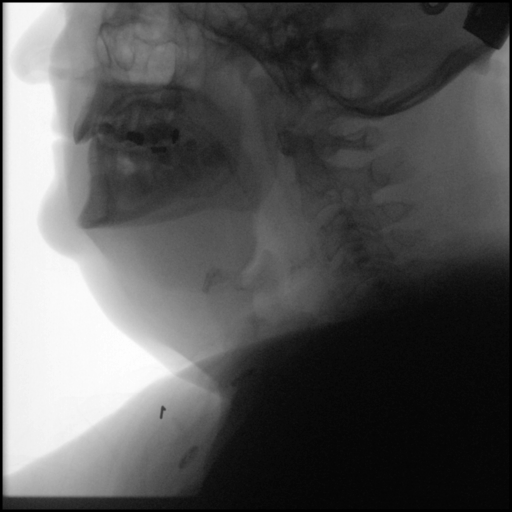
[frame 53/105]
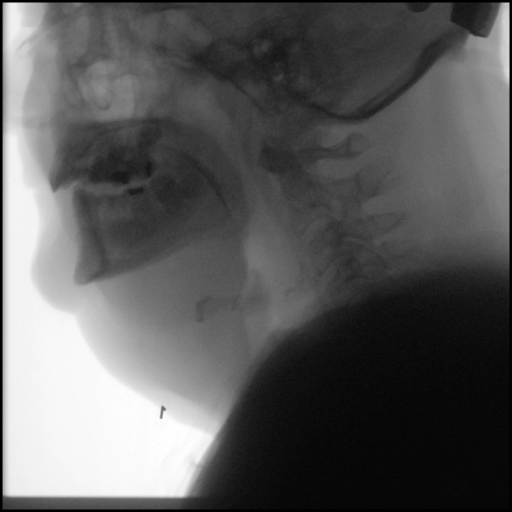

[Series 6: cp_standard · 0.51mm/px · 2 of 27 frames shown (6 of 9)]
[frame 1/27]
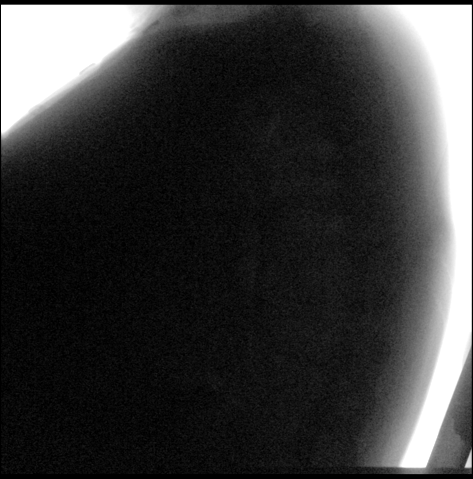
[frame 23/27]
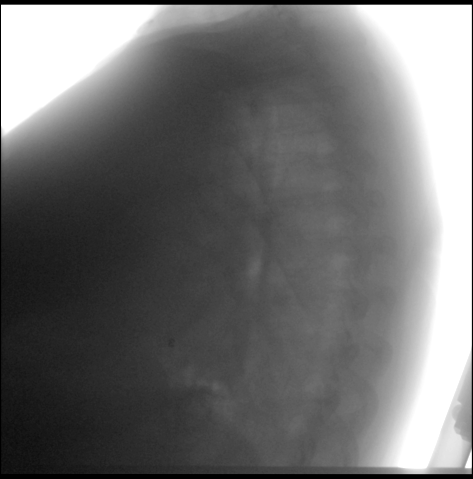

[Series 7: cp_standard · 0.51mm/px · 3 of 211 frames shown (7 of 9)]
[frame 32/211]
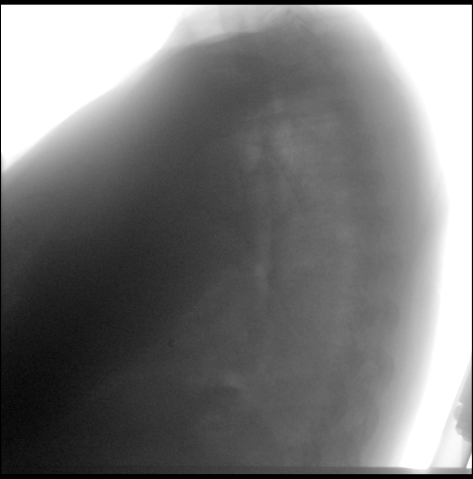
[frame 106/211]
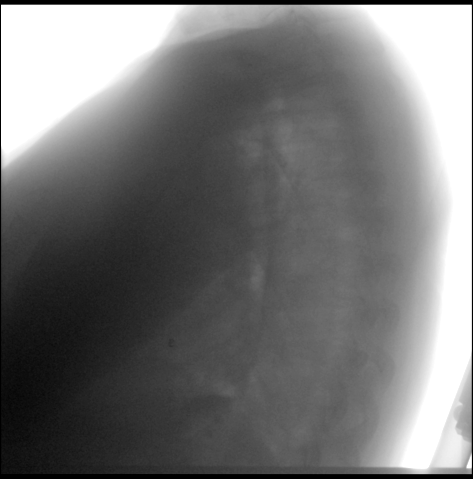
[frame 180/211]
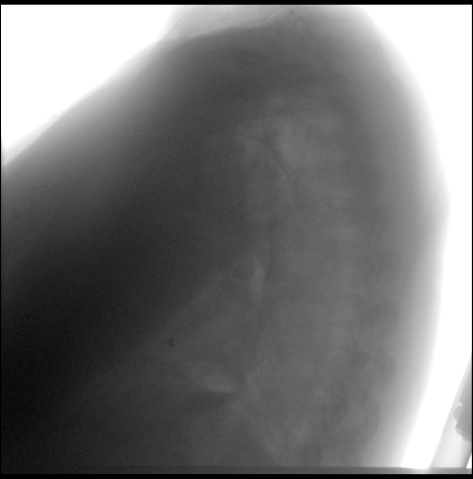

[Series 8: cp_standard · 0.34mm/px · 1 of 112 frames shown (8 of 9)]
[frame 17/112]
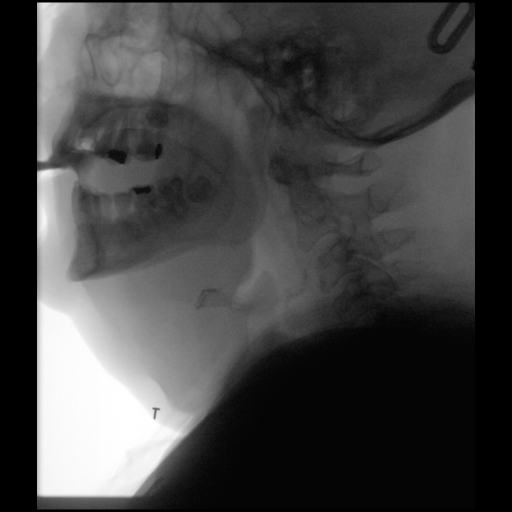

[Series 9: cp_standard · 0.34mm/px · 3 of 110 frames shown (9 of 9)]
[frame 17/110]
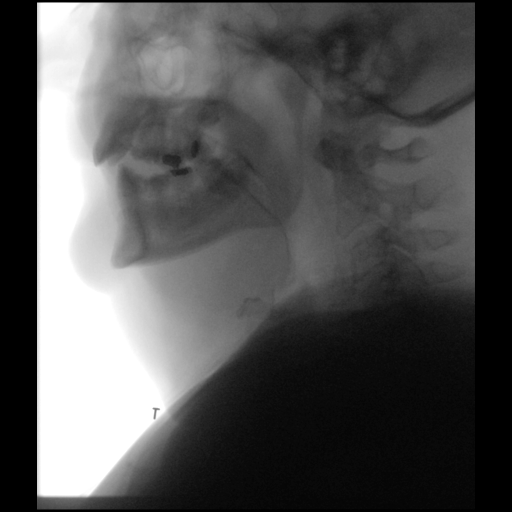
[frame 56/110]
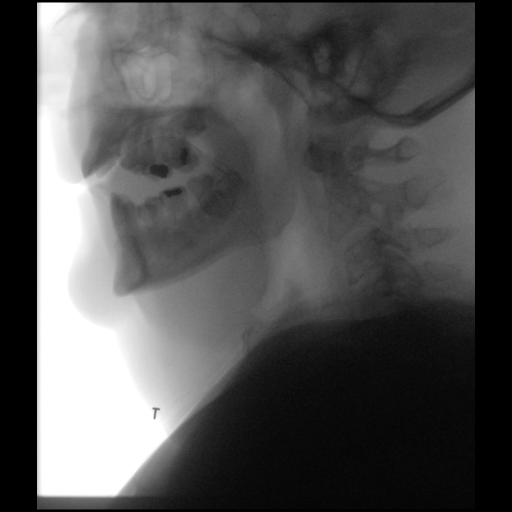
[frame 99/110]
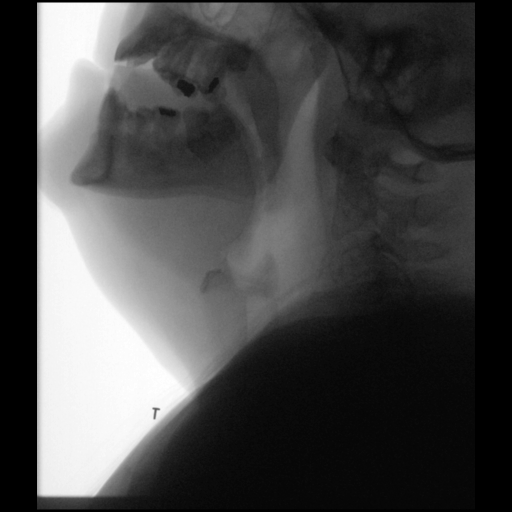

[20 of 24 positions shown; findings below may reference images not displayed]

FINDINGS: Delayed swallow trigger is noted. Vestibular penetration occurred
with thin liquid. No evidence of aspiration. No vestibular
penetration or aspiration seen with puree or puree with cracker.
IMPRESSION: Delayed swallow trigger noted. Vestibular penetration with thin
liquid. No evidence of aspiration.

Please refer to the Speech Pathologists report for complete details
and recommendations.

## 2021-06-17 ENCOUNTER — Ambulatory Visit (INDEPENDENT_AMBULATORY_CARE_PROVIDER_SITE_OTHER): Payer: Medicare HMO | Admitting: Pharmacist

## 2021-06-17 DIAGNOSIS — E785 Hyperlipidemia, unspecified: Secondary | ICD-10-CM

## 2021-06-17 DIAGNOSIS — I5032 Chronic diastolic (congestive) heart failure: Secondary | ICD-10-CM

## 2021-06-17 DIAGNOSIS — J449 Chronic obstructive pulmonary disease, unspecified: Secondary | ICD-10-CM

## 2021-06-17 DIAGNOSIS — E1151 Type 2 diabetes mellitus with diabetic peripheral angiopathy without gangrene: Secondary | ICD-10-CM

## 2021-06-17 NOTE — Patient Instructions (Addendum)
Visit Information   Diabetes: Improving Goal A1c < 6.5% Last A1c was 6.7 Current treatment:  None Interventions:   Counseled on A1c goal Reviewed home blood goals  Fasting blood glucose goal (before meals) = 80 to 130 Blood glucose goal after a meal = less than 180  Recommend recheck A1c with next labs. If >6.5% consider starting medication therapy Continue to limit intake of bread and sweets Great job with weight loss!  Hypertension / Heart Disease:  Goal: decreased symptoms of heart disease, prevent exacerbations and BP <140/90.  Current treatment: Furosemide 20mg  - take 3 tablets = 60mg  daily in the morning Metoprolol tartrate 25mg  twice a day Spironolactone 25mg  - take 0.5 tablet = 12.5mg  each morning Valsartan 80mg  daily Interventions:   Reviewed blood pressure goal Continue to check blood pressure 2 to 3 times a week, record and bring to future appointments. Discussed signs and symptoms of worsening heart disease weight gain, shortness of breath, abdominal fullness, swelling in legs or abdomen, Fatigue and weakness, changes in ability to perform usual activities, persistent cough or wheezing with white or pink blood-tinged mucus, nausea and lack of appetite. Contact cardiologist or primary care physician if you experience any of these symptoms.  Continue to check weight every day. Report weight gain of more than 3 lbs in 24 hours or 5 lbs in 1 week.   Hyperlipidemia with elevated triglycerides / history of stroke: LDL goal <55; Triglycerides goal <150 Last LDL was 70  Current treatment: Atorvastatin 80mg  daily Ezetimibe 10mg  daily  Fenofibrate 145mg  daily Aspirin 81mg  daily with breakfast (has not taken recently Clopidogrel 75mg  daily Isosorbide ER 60mg  0 take 1.5 tablets = 90mg  daily Nitroglycerin 0.4mg  - place 1 tablet under the tongue and allow to dissolve as needed for chest pain Interventions:  Education provided about LDL and triglyceride goal Discussed benefits  of getting LDL to goal and taking atorvastatin daily for worsening of heart disease / stroke Recommend recheck lipid panel with next labs.  Continue current medications listed above for heart health / cholesterol lowering / stroke prevention   Chronic Obstructive Pulmonary Disease / sleep apnea: Current treatment: CPAP Symbicort (budesonide/formoterol) 160/4.5 - inhale 2 puffs into lungs twice a day Interventions:  Continue to follow up with pulmonologist  Use Symbicort - 2 puffs twice a day Remember to rinse mouth after using Symbicort.  Discontinue smoking or at least decrease number of cigarettes smoked per day.    Medication Management:  Goal: take medications as prescribed; consult clinical pharmacist if any questions or issues with medication access Current Pharmacy:  locally uses [facility].  [facility] Interventions:  Continue using either color stickers or color pill containers to help patient identify morning, afternoon, evening and night medications Reviewed each medication with patient and discussed how to take and reason for taking.   Patient Goals/Self-Care Activities Over the next 90 days, patient will:  Take medications as prescribed, focus on medication adherence by utilizing mail order pharmacy and contacting clinical pharmacist about any medications questions or concerns Check blood pressure 2 to 3 times per week, document, and provide at future appointments,  Weigh daily, and contact provider if weight gain of more than 3 lbs in 24 hours or 5 lbs in 1 week Collaborate with provider on medication access solutions We applies for Extra Assistance from Medicare today - please let me know when you receive a letter with either approval or denial.   Follow Up Plan: Telephone follow up appointment with  care management team member scheduled for 1 month   Patient verbalizes understanding of instructions provided today and agrees to view in [place].    Telephone follow up appointment with care management team member scheduled for: 07/17/2021  Cherre Robins, PharmD Clinical Pharmacist [place] [facility]

## 2021-06-17 NOTE — Chronic Care Management (AMB) (Signed)
//   Chronic Care Management Pharmacy Note  06/17/2021 Name:  [name]. MRN:  [id number] DOB:  16-Jun-1961  Summary:  Updated medication list and allergies.  Discussed speech and swallowing difficulties. Patient considering speech therapy.  Reviewed refill records and adherence.   Subjective: [name]. is an 60 y.o. year old male who is a primary patient of [name], [name], MD.  The CCM team was consulted for assistance with disease management and care coordination needs.    Collaboration with patient's sister regarding medications and [place]  for follow up visit in response to provider referral for pharmacy case management and/or care coordination services.   Consent to Services:  The patient was given information about Chronic Care Management services, agreed to services, and gave verbal consent prior to initiation of services.  Please see initial visit note for detailed documentation.   Patient Care Team: Colon Branch, MD as PCP - General (Internal Medicine) Burnell Blanks, MD as PCP - Cardiology (Cardiology) Ralene Bathe, MD as Consulting Physician (Ophthalmology) Pyrtle, Lajuan Lines, MD as Consulting Physician (Gastroenterology) Susa Day, MD as Consulting Physician (Orthopedic Surgery) Luretha Rued, RN as Case Manager Cherre Robins, RPH-CPP (Pharmacist) Rozetta Nunnery, MD as Consulting Physician (Otolaryngology)  Recent office visits: 05/07/2021 - PCP (Dr [name]) follow up chronic conditions. Increased trazodone to take 151m at bedtime Discontinued hydrocodone/ APAP from med list - patient not taking. 10/22/2020 - PCP (Dr PLarose Kells - F/U chronic conditions. Removed Repatha from med list as pt reported no longer taking due to cost. Patient also was not taking metformin - new Rx sent. Recommended restart trazodone for depression / insomnia. Referral for outpatient speach therapy.   Recent consult visits: 04/28/2021 - Orolaryngology  (Dr NLucia Gaskins Seen for epistaxis and hoarseness. No identifiable cause of right sided epistaxis. Recommended use Afrin on a cottonball to stop any nosebleed. No cause of hoarseness identified, possibly reflux. No med changes noted. 03/31/2021 - Pulmonology (Dr WMelvyn Novas F/U COPD. No medication changes.  PFT's  03/31/2021  FEV1 1.93 (53 % ) ratio 0.70  p 13 % improvement from saba p 0 prior to study with   FV curve no signficant curvature. 03/10/2021 - Pulm (Elie Confer NP) Phone Visit - Lung Cancer Screeing education session. CT scheduled.  01/28/2021 - Pulm (Parrett, NP) F/U cough OSA and SOB. Suspects underlying COPD - ordered PFTs. Recommended restart CPAP. Ordered DME to change CPAP to Auto set 10-20. [facility]. Stopped potassium supplement; Started spironolacton 275m- take 0.5 tablet daily. Consider SGLT2 or Entresto in future.   Hospital visits: 04/11/2021 - ED for abdominal pain - no medication changes noted.  Objective:  Lab Results  Component Value Date   CREATININE 1.02 04/11/2021   CREATININE 1.02 02/19/2021   CREATININE 0.94 02/12/2021    Lab Results  Component Value Date   HGBA1C 6.7 (H) 05/07/2021   Last diabetic Eye exam: No results found for: HMDIABEYEEXA  Last diabetic Foot exam: No results found for: HMDIABFOOTEX      Component Value Date/Time   CHOL 141 05/07/2021 1347   CHOL 171 11/14/2019 1441   TRIG 142.0 05/07/2021 1347   HDL 41.70 05/07/2021 1347   HDL 37 (L) 11/14/2019 1441   CHOLHDL 3 05/07/2021 1347   VLDL 28.4 05/07/2021 1347   LDLCALC 71 05/07/2021 1347   LDLCALC 116 (H) 11/14/2019 1441   LDLDIRECT 116.0 05/12/2013 1517    Hepatic Function Latest Ref Rng &  Units 04/11/2021 07/10/2020 11/26/2018  Total Protein 6.5 - 8.1 g/dL 7.9 7.1 7.8  Albumin 3.5 - 5.0 g/dL 4.3 4.0 3.9  AST 15 - 41 U/L '18 12 22  ' ALT 0 - 44 U/L '15 13 19  ' Alk Phosphatase 38 - 126 U/L 66 135(H) 88  Total Bilirubin 0.3 - 1.2 mg/dL 1.5(H) 1.2 2.1(H)   Bilirubin, Direct 0.0 - 0.3 mg/dL - - -    Lab Results  Component Value Date/Time   TSH 1.895 07/07/2016 06:31 PM   TSH 1.88 05/05/2013 12:14 PM   TSH 2.290 **Test methodology is 3rd generation TSH** 06/23/2009 07:55 AM    CBC Latest Ref Rng & Units 04/11/2021 12/27/2019 11/26/2018  WBC 4.0 - 10.5 K/uL 11.0(H) 7.4 7.7  Hemoglobin 13.0 - 17.0 g/dL 15.2 15.5 17.4(H)  Hematocrit 39.0 - 52.0 % 45.9 47.6 53.7(H)  Platelets 150 - 400 K/uL 211 221.0 165    Lab Results  Component Value Date/Time   VD25OH 10.8 (L) 11/03/2016 02:53 PM    Clinical ASCVD: Yes  The ASCVD Risk score (Arnett DK, et al., 2019) failed to calculate for the following reasons:   The patient has a prior MI or stroke diagnosis     Social History   Tobacco Use  Smoking Status Every Day   Packs/day: 0.50   Years: 45.00   Pack years: 22.50   Types: Cigarettes   Start date: 1976  Smokeless Tobacco Never  Tobacco Comments   1 pack daily-03/10/21   BP Readings from Last 3 Encounters:  05/07/21 116/72  04/11/21 135/78  03/31/21 118/66   Pulse Readings from Last 3 Encounters:  05/07/21 62  04/11/21 65  03/31/21 (!) 56   Wt Readings from Last 3 Encounters:  05/07/21 216 lb 8 oz (98.2 kg)  04/11/21 221 lb 12.5 oz (100.6 kg)  03/31/21 222 lb (100.7 kg)    Assessment: Review of patient past medical history, allergies, medications, health status, including review of consultants reports, laboratory and other test data, was performed as part of comprehensive evaluation and provision of chronic care management services.   SDOH:  (Social Determinants of Health) assessments and interventions performed:  SDOH Interventions    Flowsheet Row Most Recent Value  SDOH Interventions   Transportation Interventions Intervention Not Indicated  [patient has car back for the last month. He is able to drive short distance and during daylight hours.]         CCM Care Plan  Allergies  Allergen Reactions   Bupropion  Nausea Only    Medications Reviewed Today     Reviewed by Cherre Robins, RPH-CPP (Pharmacist) on 06/17/21 at 67  Med List Status: <None>   Medication Order Taking? Sig Documenting Provider Last Dose Status Informant  aspirin EC 81 MG EC tablet [id number] Yes Take 1 tablet (81 mg total) by mouth daily with breakfast. Roxan Hockey, MD Taking Active   atorvastatin (LIPITOR) 80 MG tablet [id number] Yes TAKE 1 TABLET (80 MG TOTAL) BY MOUTH DAILY AT 6 PM. NEED TO KEEP APPOINTMENT FOR ANY FUTURE REFILLS. Burnell Blanks, MD Taking Active   budesonide-formoterol [facility]) 160-4.5 MCG/ACT inhaler [id number] Yes Inhale 2 puffs into the lungs 2 (two) times daily. Colon Branch, MD Taking Active   clopidogrel (PLAVIX) 75 MG tablet [id number] Yes TAKE 1 TABLET EVERY DAY Burnell Blanks, MD Taking Active   ezetimibe (ZETIA) 10 MG tablet [id number] Yes Take 1 tablet (10 mg total) by mouth daily. Burnell Blanks, MD Taking Active  fenofibrate (TRICOR) 145 MG tablet [id number] Yes Take 1 tablet (145 mg total) by mouth daily. Colon Branch, MD Taking Active   folic acid (FOLVITE) 1 MG tablet [id number] Yes Take 1 tablet (1 mg total) by mouth daily. Roxan Hockey, MD Taking Active   furosemide (LASIX) 20 MG tablet [id number] Yes Take 3 tablets (60 mg total) by mouth daily. Burnell Blanks, MD Taking Active            Med Note Luretha Rued   Tue Jun 03, 2021  2:24 PM) Reports he is taking 2 tablets daily.  isosorbide mononitrate (IMDUR) 60 MG 24 hr tablet [id number] Yes Take 1.5 tablets (90 mg total) by mouth daily. Burnell Blanks, MD Taking Active   MELATONIN GUMMIES PO [id number] Yes Take 5 mg by mouth at bedtime as needed. [provider] Taking Active   metoprolol tartrate (LOPRESSOR) 25 MG tablet [id number] Yes TAKE 1 TABLET TWICE DAILY McAlhany, Annita Brod, MD Taking Active   nitroGLYCERIN (NITROSTAT) 0.4 MG SL tablet [id number] Yes Place 1 tablet (0.4 mg  total) under the tongue every 5 (five) minutes x 3 doses as needed for chest pain. Colon Branch, MD Taking Active            Med Note Alinda Money, Lester Kinsman May 07, 2021 12:59 PM) PRN  pantoprazole (PROTONIX) 40 MG tablet [id number] Yes TAKE 1 TABLET TWICE DAILY Burnell Blanks, MD Taking Active   spironolactone (ALDACTONE) 25 MG tablet [id number] Yes Take 0.5 tablets (12.5 mg total) by mouth daily. Burnell Blanks, MD Taking Active   tamsulosin [facility]) 0.4 MG CAPS capsule [id number] Yes Take 0.4 mg by mouth at bedtime. [provider] Taking Active   thiamine 100 MG tablet [id number] Yes Take 1 tablet (100 mg total) by mouth daily. Roxan Hockey, MD Taking Active   traZODone (DESYREL) 100 MG tablet [id number] Yes Take 1 tablet (100 mg total) by mouth at bedtime. Colon Branch, MD Taking Active   valsartan (DIOVAN) 80 MG tablet [id number] Yes Take 1 tablet (80 mg total) by mouth daily. Burnell Blanks, MD Taking Active             Patient Active Problem List   Diagnosis Date Noted   COPD 0 AB 03/31/2021   (HFpEF) heart failure with preserved ejection fraction (HCC)    DOE (dyspnea on exertion) 11/07/2020   Diabetes ([place]) 03/18/2020   Prolapsed lumbar disc 01/25/2020   Closed fracture of lateral malleolus 01/25/2020   Hiatal hernia    Hepatic steatosis    Hemianopia, homonymous, right    ETOH abuse    Esophagitis    Chronic diastolic CHF (congestive heart failure) ([place])    Chronic bronchitis ([place])    CAD (coronary artery disease)    PAD (peripheral artery disease) ([place]) 09/23/2016   Morbid obesity ([place])    Chronic pain syndrome 10/23/2015   GERD (gastroesophageal reflux disease) 08/07/2015   Pancreatitis, acute    PCP NOTES >>>>> 06/01/2015   Annual physical exam 05/31/2015   Acute ischemic stroke ([place]) 02/20/2015   Hx of cocaine abuse ([place]) 02/20/2015   BPH (benign prostatic hyperplasia) 02/20/2015   OSA (obstructive sleep apnea) ?? 05/05/2013    Noncompliance 05/05/2013   Anxiety and depression 05/05/2013   Erectile dysfunction 03/17/2012   Emphysema (subcutaneous) (surgical) resulting from a procedure 10/08/2008   Iron deficiency anemia 08/10/2008   ABNORMALITY OF GAIT 08/06/2008   Cigarette smoker 07/27/2008  Dyslipidemia 07/26/2008   Essential hypertension 07/26/2008   ST elevation myocardial infarction involving left circumflex coronary artery ([place]) 07/26/2008    Immunization History  Administered Date(s) Administered   PFIZER(Purple Top)SARS-COV-2 Vaccination 11/17/2019, 12/11/2019, 07/18/2020   Pneumococcal Polysaccharide-23 11/03/2016   Tdap 11/03/2016    Conditions to be addressed/monitored: CHF, CAD, HTN, HLD, Hypertriglyceridemia, COPD, DMII, and BPH; OSA; GERD; fatty liver; h/o strokePAD; sleep disturbance; anxiety/ depression  Care Plan : Pharmacy Care Plan  Updates made by Cherre Robins, RPH-CPP since 06/17/2021 12:00 AM     Problem: HTN; CHF: CAD; GERD with esophagitis; depression; BPH; COPD; bronchitis; stroke; elevated TG and hyperlipidemia; type 2 DM with h/o pancreatitis   Priority: High  Onset Date: 03/18/2021     Long-Range Goal: Management of chronic conditions and medications   Start Date: 03/18/2021  This Visit's Progress: On track  Priority: High  Note:   Current Barriers:  Unable to independently monitor therapeutic efficacy Unable to achieve control of Hyperlipidemia  Unable to maintain control of HTN, CHF Does not adhere to prescribed medication regimen Does not contact provider office for questions/concerns  Pharmacist Clinical Goal(s):  Over the next 90 days, patient will verbalize ability to afford treatment regimen achieve adherence to monitoring guidelines and medication adherence to achieve therapeutic efficacy achieve control of Hyperlipidemia as evidenced by LDL <70 maintain control of CHF, HTN and type 2 DM as evidenced by attaining goals listed below  adhere to prescribed  medication regimen as evidenced by refill history contact provider office for questions/concerns as evidenced notation of same in electronic health record through collaboration with PharmD and provider.   Interventions: 1:1 collaboration with Colon Branch, MD regarding development and update of comprehensive plan of care as evidenced by provider attestation and co-signature Inter-disciplinary care team collaboration (see longitudinal plan of care) Comprehensive medication review performed; medication list updated in electronic medical record  Diabetes: Improving; Goal A1c < 6.5% Last A1c was 6.7 on 05/07/2021 - down form 7.0% (07/10/2020) Patient report he has lost about 17lbs (down to 199lbs)  Current treatment:  None Tried metformin in past but stopped due to GI side effects (tried 578m/day and 2533mday) History of pancreatitis (possibly related to alcohol use)   Current glucose readings: does not currently check BG at home Denies hypoglycemic/hyperglycemic symptoms Current meal patterns: over the last 2 to 3 weeks has been limiting bread and sweets Current exercise: none currently Interventions: (addressed at previous visits)   Counseled on A1c and BG goals ,  Continue to monitor A1c if remains >6.5% consider starting SGLT2 Continue to limit serving sizes and intake of breads and sweets  Hypertension / CHF: Improving; goal: decrease symptoms of CHF, prevent exacerbations and BP <140/90.  Current treatment: Furosemide 2062m take 3 tablets = 34m69mily in the morning Metoprolol tartrate 25mg30mce a day Spironolactone 25mg 17mke 0.5 tablet = 12.5mg ea59mmorning Valsartan 80mg da61mReceived scales and has been checking weight at home. Weight has decreased and reports down to 199lbs.  EF = 55-60% (01/11/2023/09/73532 systolic dysfunction NYHA 2b Past medications tried: lisinopril 10mg - c23me to valsartan by pulmonologist.  Denies hypotensive/hypertensive  symptoms Interventions:  Reviewed blood pressure goals Discussed signs and symptoms of CHF exacerbation - weight gain, SOB, abdominal fullness, swelling in legs or abdomen, Fatigue and weakness, changes in ability to perform usual activities, persistent cough or wheezing with white or pink blood-tinged mucus, nausea and lack of appetite Continue to weigh daily - report weight  gain of more than 3 lbs in 24 hours or 5 lbs in 1 week.   Hyperlipidemia / CAD / history of stroke / PAD: Uncontrolled but improving; LDL goal <55 (per cardio) ; Tg goal <150 Last LDL decreased from 116 to 71 (05/07/2021)  Current treatment: Atorvastatin 30m daily Ezetimibe 122mdaily - added 05/07/2021 Fenofibrate 14563maily Aspirin 24m31mily with breakfast  Clopidogrel 75mg11mly Isosorbide ER 60mg 11mke 1.5 tablets = 90mg d99m Nitroglycerin 0.4mg - p40me 1 tablet under the tongue and allow to dissolve as needed for chest pain Medications previously tried: Repatha - stopped due to cost; Brilenta  - stopped due to shortness of breath Patient reports he has restarted aspirin. He has not had a nosebleed in the last 2 weeks Interventions:  Continue aspirin 24mg dai62mFollow up as planned with ENT Continue current regimen for lipid lowering. Recheck lipids in next 4 to 8 weeks.   Chronic Obstructive Pulmonary Disease / OSA: Controlled Current treatment: CPAP Symbicort (budesonide/formoterol) 160/4.5 - inhale 2 puffs into lungs twice a day Managed by pulmonologist - Dr Wert PFT'Melvyn Novas/22/2022  FEV1 1.93 (53 % ) ratio 0.70  p 13 % improvement from saba p 0 prior to study with   FV curve no signficant curvature Previous medications tried: Spiriva - not effective Interventions:  Continue to follow up with pulmonologist  Discussed that Symbicort was for healing his lungs / for maintenance.  Reminded patient to rinse mouth after using Symbicort.   Tobacco Use disorder:  Patient has restarted smoking after  quitting for about 4 months.  Reports smoking 1ppd Patient states he wants to quit.  Past therapies tried: nicotine patches (not effective) ; Chantix (not helpful); Cold turkey - [place]; bupropion tried after last visit - caused nausea  Interventions:  Encouraged patient to decreased number of cigarettes smoked per day.  Could consider nortriptyline in future as there has been some success for smoking cessation with nortriptyline. Could also help with sleep.   Medication Management:  Goal: take medications as prescribed; consult pharmacy if any questions or issues with medication access [facility] Patient reports he does have his car pack but only drives short distances and during daylight hours.  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 Sometimes has difficulty reading labels on his prescription bottles.  His sister, Christy iAlyse Lowng patient with medications as she is able ([place] in [place] to see him frequently)  Patient has started using colored stickers to help him know which medications he is to take in morning, mid day and evening. Report this is working well. His daughter in law assists with this.  Interventions: (at previous visit)  Called [facility] mail order and reviewed refill history. Patient has been refilling medications on time.  Patient's sister states he has having some difficulty swallowing / speaking. Looks like speech therapy was recommended after swallowing study 09/2020. Mrs. Rierson will speak with patient and get back to use if he would like to proceed with speech therapy.   Patient Goals/Self-Care Activities Over the next 90 days, patient will:  take medications as prescribed, focus on medication adherence by utilizing mail order pharmacy and contacting clinical pharmacist about any medications questions or concerns, check blood pressure 2 to 3 times per week,  document, and provide at future appointments, weigh daily, and contact provider if weight gain of more than 3 lbs in 24  hours or 5 lbs in 1 week, and collaborate with provider on medication access solutions  Follow Up Plan: Telephone follow up appointment with care management team member scheduled for:  4 weeks.              Medication Assistance:  none at this time but might need assistance if additional lipid lowering therapy needed in future.   Patient's preferred pharmacy is:  [facility], [place] - [address] [place] [address] Phone: [phone] Fax: [phone]  [facility], [address] [place] [address] Phone: [phone] Fax: [phone]  Uses pill box? No - patient has declined to use pill container but he is starting to use stickers on bottles to help identify time of day to take each medication  Follow Up:  Patient agrees to Care Plan and Follow-up.  Plan: follow up phone call with clinical pharmacist planned for after next visit with PCP in December (Jan 2023)  Cherre Robins, PharmD Clinical Pharmacist [facility]

## 2021-06-18 NOTE — Telephone Encounter (Signed)
He has a complex medical history.  Please arrange a office visit

## 2021-06-26 ENCOUNTER — Other Ambulatory Visit: Payer: Self-pay | Admitting: Internal Medicine

## 2021-06-30 DIAGNOSIS — E119 Type 2 diabetes mellitus without complications: Secondary | ICD-10-CM | POA: Diagnosis not present

## 2021-06-30 LAB — HM DIABETES EYE EXAM

## 2021-07-01 ENCOUNTER — Telehealth: Payer: Medicare HMO

## 2021-07-01 ENCOUNTER — Ambulatory Visit: Payer: Medicare HMO

## 2021-07-01 DIAGNOSIS — I5032 Chronic diastolic (congestive) heart failure: Secondary | ICD-10-CM

## 2021-07-01 DIAGNOSIS — K219 Gastro-esophageal reflux disease without esophagitis: Secondary | ICD-10-CM

## 2021-07-01 NOTE — Patient Instructions (Addendum)
Visit Information  Thank you for taking time to visit with me today. Please don't hesitate to contact me if I can be of assistance to you before our next scheduled telephone appointment.  Following are the goals we discussed today:  Patient Goals/Self-Care Activities: develop a rescue plan follow rescue plan if symptoms flare-up know when to call the doctor weight gain of 3 pounds overnight, increased swelling in stomach, feet/legs or hands. Increased shortness of breath, having less energy than normal, new or worsening dizziness, if it is harder for you to breathe when lying down and you need to sit up to breathe or you feel that something is not right.  Make an appointment with your eye doctor Monitor for signs/symptoms of COPD worsening: increase cough, shortness of breath or mucus production change for 2 days or more, your number 1 priority is to call your doctor. eat more whole grains, fruits and vegetables, lean meats and healthy fats Attend provider visits as scheduled. Next appointment with primary care provider: 08/05/21.  Our next appointment is by telephone on 08/07/21 at 1:00 pm  Please call the care guide team at [phone] if you need to cancel or reschedule your appointment.   Please call the Suicide and Crisis Lifeline: 988 call the [place] National Suicide Prevention Lifeline: 873-373-8719 or TTY: 2033994934 TTY (336)100-2475) to talk to a trained counselor call 1-800-273-TALK (toll free, 24 hour hotline) call 911 if you are experiencing a Mental Health or [place] or need someone to talk to.  Patient verbalizes understanding of instructions provided today and agrees to view in [place].   Thea Silversmith, RN, MSN, BSN, CCM Care Management Coordinator [facility] [phone]    Food Choices for Gastroesophageal Reflux Disease, Adult When you have gastroesophageal reflux disease (GERD), the foods you eat and your eating habits are very  important. Choosing the right foods can help ease your discomfort. Think about working with a food expert (dietitian) to help you make good choices. What are tips for following this plan? Reading food labels Look for foods that are low in saturated fat. Foods that may help with your symptoms include: Foods that have less than 5% of daily value (DV) of fat. Foods that have 0 grams of trans fat. Cooking Do not fry your food. Cook your food by baking, steaming, grilling, or broiling. These are all methods that do not need a lot of fat for cooking. To add flavor, try to use herbs that are low in spice and acidity. Meal planning  Choose healthy foods that are low in fat, such as: Fruits and vegetables. Whole grains. Low-fat dairy products. Lean meats, fish, and poultry. Eat small meals often instead of eating 3 large meals each day. Eat your meals slowly in a place where you are relaxed. Avoid bending over or lying down until 2-3 hours after eating. Limit high-fat foods such as fatty meats or fried foods. Limit your intake of fatty foods, such as oils, butter, and shortening. Avoid the following as told by your doctor: Foods that cause symptoms. These may be different for different people. Keep a food diary to keep track of foods that cause symptoms. Alcohol. Drinking a lot of liquid with meals. Eating meals during the 2-3 hours before bed. Lifestyle Stay at a healthy weight. Ask your doctor what weight is healthy for you. If you need to lose weight, work with your doctor to do so safely. Exercise for at least 30 minutes on 5 or more  days each week, or as told by your doctor. Wear loose-fitting clothes. Do not smoke or use any products that contain nicotine or tobacco. If you need help quitting, ask your doctor. Sleep with the head of your bed higher than your feet. Use a wedge under the mattress or blocks under the bed frame to raise the head of the bed. Chew sugar-free gum after  meals. What foods should eat? Eat a healthy, well-balanced diet of fruits, vegetables, whole grains, low-fat dairy products, lean meats, fish, and poultry. Each person is different. Foods that may cause symptoms in one person may not cause any symptoms in another person. Work with your doctor to find foods that are safe for you. The items listed above may not be a complete list of what you can eat and drink. Contact a food expert for more options. What foods should I avoid? Limiting some of these foods may help in managing the symptoms of GERD. Everyone is different. Talk with a food expert or your doctor to help you find the exact foods to avoid, if any. Fruits Any fruits prepared with added fat. Any fruits that cause symptoms. For some people, this may include citrus fruits, such as oranges, grapefruit, pineapple, and lemons. Vegetables Deep-fried vegetables. [place] fries. Any vegetables prepared with added fat. Any vegetables that cause symptoms. For some people, this may include tomatoes and tomato products, chili peppers, onions and garlic, and horseradish. Grains Pastries or quick breads with added fat. Meats and other proteins High-fat meats, such as fatty beef or pork, hot dogs, ribs, ham, sausage, salami, and bacon. Fried meat or protein, including fried fish and fried chicken. Nuts and nut butters, in large amounts. Dairy Whole milk and chocolate milk. Sour cream. Cream. Ice cream. Cream cheese. Milkshakes. Fats and oils Butter. Margarine. Shortening. Ghee. Beverages Coffee and tea, with or without caffeine. Carbonated beverages. Sodas. Energy drinks. Fruit juice made with acidic fruits, such as orange or grapefruit. Tomato juice. Alcoholic drinks. Sweets and desserts Chocolate and cocoa. Donuts. Seasonings and condiments Pepper. Peppermint and spearmint. Added salt. Any condiments, herbs, or seasonings that cause symptoms. For some people, this may include curry, hot sauce, or  vinegar-based salad dressings. The items listed above may not be a complete list of what you should not eat and drink. Contact a food expert for more options. Questions to ask your doctor Diet and lifestyle changes are often the first steps that are taken to manage symptoms of GERD. If diet and lifestyle changes do not help, talk with your doctor about taking medicines. Where to find more information International Foundation for Gastrointestinal Disorders: aboutgerd.org Summary When you have GERD, food and lifestyle choices are very important in easing your symptoms. Eat small meals often instead of 3 large meals a day. Eat your meals slowly and in a place where you are relaxed. Avoid bending over or lying down until 2-3 hours after eating. Limit high-fat foods such as fatty meats or fried foods. This information is not intended to replace advice given to you by your health care provider. Make sure you discuss any questions you have with your health care provider. Document Revised: 02/05/2020 Document Reviewed: 02/05/2020 Elsevier Patient Education  [place].

## 2021-07-01 NOTE — Chronic Care Management (AMB) (Addendum)
Chronic Care Management   CCM RN Visit Note  07/01/2021 Name: [name]. MRN: [id number] DOB: Jun 23, 1961  Subjective: [name]. is a 60 y.o. year old male who is a primary care patient of Colon Branch, MD. The care management team was consulted for assistance with disease management and care coordination needs.    Engaged with patient by telephone for follow up visit in response to provider referral for case management and/or care coordination services.   Consent to Services:  The patient was given information about Chronic Care Management services, agreed to services, and gave verbal consent prior to initiation of services.  Please see initial visit note for detailed documentation.   Patient agreed to services and verbal consent obtained.   Assessment: Review of patient past medical history, allergies, medications, health status, including review of consultants reports, laboratory and other test data, was performed as part of comprehensive evaluation and provision of chronic care management services.   SDOH (Social Determinants of Health) assessments and interventions performed:    CCM Care Plan  Allergies  Allergen Reactions   Bupropion Nausea Only    Outpatient Encounter Medications as of 07/01/2021  Medication Sig Note   aspirin EC 81 MG EC tablet Take 1 tablet (81 mg total) by mouth daily with breakfast.    atorvastatin (LIPITOR) 80 MG tablet TAKE 1 TABLET (80 MG TOTAL) BY MOUTH DAILY AT 6 PM. NEED TO KEEP APPOINTMENT FOR ANY FUTURE REFILLS.    budesonide-formoterol (SYMBICORT) 160-4.5 MCG/ACT inhaler Inhale 2 puffs into the lungs 2 (two) times daily.    clopidogrel (PLAVIX) 75 MG tablet TAKE 1 TABLET EVERY DAY    ezetimibe (ZETIA) 10 MG tablet Take 1 tablet (10 mg total) by mouth daily.    fenofibrate (TRICOR) 145 MG tablet TAKE 1 TABLET EVERY DAY    folic acid (FOLVITE) 1 MG tablet Take 1 tablet (1 mg total) by mouth daily.    furosemide (LASIX) 20 MG  tablet Take 3 tablets (60 mg total) by mouth daily. 06/03/2021: Reports he is taking 2 tablets daily.   isosorbide mononitrate (IMDUR) 60 MG 24 hr tablet Take 1.5 tablets (90 mg total) by mouth daily.    MELATONIN GUMMIES PO Take 5 mg by mouth at bedtime as needed.    metoprolol tartrate (LOPRESSOR) 25 MG tablet TAKE 1 TABLET TWICE DAILY    nitroGLYCERIN (NITROSTAT) 0.4 MG SL tablet Place 1 tablet (0.4 mg total) under the tongue every 5 (five) minutes x 3 doses as needed for chest pain. 05/07/2021: PRN   pantoprazole (PROTONIX) 40 MG tablet TAKE 1 TABLET TWICE DAILY    spironolactone (ALDACTONE) 25 MG tablet Take 0.5 tablets (12.5 mg total) by mouth daily.    tamsulosin (FLOMAX) 0.4 MG CAPS capsule Take 0.4 mg by mouth at bedtime.    thiamine 100 MG tablet Take 1 tablet (100 mg total) by mouth daily.    traZODone (DESYREL) 100 MG tablet Take 1 tablet (100 mg total) by mouth at bedtime.    valsartan (DIOVAN) 80 MG tablet Take 1 tablet (80 mg total) by mouth daily.    No facility-administered encounter medications on file as of 07/01/2021.    Patient Active Problem List   Diagnosis Date Noted   COPD 0 AB 03/31/2021   (HFpEF) heart failure with preserved ejection fraction (HCC)    DOE (dyspnea on exertion) 11/07/2020   Diabetes ([place]) 03/18/2020   Prolapsed lumbar disc 01/25/2020   Closed fracture of lateral  malleolus 01/25/2020   Hiatal hernia    Hepatic steatosis    Hemianopia, homonymous, right    ETOH abuse    Esophagitis    Chronic diastolic CHF (congestive heart failure) (HCC)    Chronic bronchitis (HCC)    CAD (coronary artery disease)    PAD (peripheral artery disease) ([place]) 09/23/2016   Morbid obesity ([place])    Chronic pain syndrome 10/23/2015   GERD (gastroesophageal reflux disease) 08/07/2015   Pancreatitis, acute    PCP NOTES >>>>> 06/01/2015   Annual physical exam 05/31/2015   Acute ischemic stroke ([place]) 02/20/2015   Hx of cocaine abuse ([place]) 02/20/2015   BPH (benign  prostatic hyperplasia) 02/20/2015   OSA (obstructive sleep apnea) ?? 05/05/2013   Noncompliance 05/05/2013   Anxiety and depression 05/05/2013   Erectile dysfunction 03/17/2012   Emphysema (subcutaneous) (surgical) resulting from a procedure 10/08/2008   Iron deficiency anemia 08/10/2008   ABNORMALITY OF GAIT 08/06/2008   Cigarette smoker 07/27/2008   Dyslipidemia 07/26/2008   Essential hypertension 07/26/2008   ST elevation myocardial infarction involving left circumflex coronary artery ([place]) 07/26/2008    Conditions to be addressed/monitored:CHF, CAD, HTN, COPD, DMII, and GERD   Care Plan : RN Care Management Plan of Care  Updates made by Luretha Rued, RN since 07/01/2021 12:00 AM     Problem: Chronic Disease Managment Education and/or Care Coordination needs   Priority: High     Long-Range Goal: Development of plan of care for Chronic Disease Management and /or Care Coordination Needs   Start Date: 07/01/2021  Expected End Date: 10/01/2021  Priority: High  Note:   Current Barriers: [name] reports his main problem is reflux. He states he has an upcoming appointment and his sister is coming to take him to this appointment in December. Medications reviewed and [name] states he has run out of Isosorbide and Spironolactone and has a few days left of Fenofibrate. He states that he received a call over the weekend from [place] him that he has medications coming in the mail, but Mr. Heidinger is not clear what medications are in the mail. Per clinical pharmacist recommendation: RNCM called patient back to clarify, if patient is out of medication in his pill box and the medication bottles. [name] informed RNCM that he is completely out of these medication in both med box and medication bottle. He reports that he took spironolactone 1 tablet for about two weeks because he did not have a pill cutter to cut pill in half. But added that his sister brought him a pill splitter  that he is using now. He continues to smoke, but is not interested in smoking cessation at this time. Knowledge Deficits related to plan of care for management of CHF, CAD, HTN, COPD, DMII, and GERD  Care Coordination needs related to Limited access to food  Chronic Disease Management support and education needs related to CHF, CAD, HTN, COPD, DMII, and GERD  RNCM Clinical Goal(s):  Patient will verbalize basic understanding of CHF, CAD, HTN, COPD, DMII, and GERD disease process and self health management plan as evidenced by self report of taking and chart review of taking medications as prescribed, attending provider visits as scheduled demonstrate improved adherence to prescribed treatment plan for CHF, CAD, HTN, COPD, DMII, and GERD as evidenced by self report and/or chart notation of taking medications as precribed, calling provider with questions concerns, attending provider visits. work with pharmacist to address Medication procurement related to CHF, CAD, HTN,  COPD, DMII, and GERD as evidenced by review of EMR and patient or pharmacist report    work with community resource care guide to address needs related to Transportation and Limited access to food as evidenced by patient and/or community resource care guide support    through collaboration with Consulting civil engineer, provider, and care team. Transportation resources discussed with patient by care guide.   Interventions: 1:1 collaboration with primary care provider regarding development and update of comprehensive plan of care as evidenced by provider attestation and co-signature Inter-disciplinary care team collaboration (see longitudinal plan of care) Evaluation of current treatment plan related to  self management and patient's adherence to plan as established by provider Collaboration and Care Coordinating with embedded clinical pharmacist, Cherre Robins, Pharm D.  GERD Interventions: Long Term: New goal. Evaluation of current treatment  plan related to GERD,     self-management and patient's adherence to plan as established by provider. Discussed plans with patient for ongoing care management follow up and provided patient with direct contact information for care management team Advised patient to continue to take medications as prescribed, attend provider appointment as scheduled and contact provider for worsening symptoms for a sooner appointment.; Discussed smoking cessation and encouraged to contact provider, clinical pharmacist and/or RN Care manager if he considers otherwise.  Hypertension Interventions: Long Term: goal met Last practice recorded BP readings:  BP Readings from Last 3 Encounters:  05/07/21 116/72  04/11/21 135/78  03/31/21 118/66  Most recent eGFR/CrCl:  Lab Results  Component Value Date   EGFR 85 02/19/2021    No components found for: CRCL  Evaluation of current treatment plan related to hypertension self management and patient's adherence to plan as established by provider; Reviewed medications with patient and discussed importance of compliance; Discussed plans with patient for ongoing care management follow up and provided patient with direct contact information for care management team; Reviewed scheduled/upcoming provider appointments including:   Diabetes Interventions: Long Term: goal met Assessed patient's understanding of A1c goal: <7% Counseled on importance of regular laboratory monitoring as prescribed; Reviewed scheduled/upcoming provider appointments including: 08/05/21; Lab Results  Component Value Date   HGBA1C 6.7 (H) 05/07/2021   COPD Interventions: Long Term: goal on track  Advised patient to self assesses COPD action plan zone and make appointment with provider if in the yellow zone for 48 hours without improvement; Provided education about and advised patient to utilize infection prevention strategies to reduce risk of respiratory infection;  Heart Failure Interventions:  Long Term: goal on track Advised patient to weigh each morning after emptying bladder; Discussed importance of daily weight and advised patient to weigh and record daily; Discussed the importance of keeping all appointments with provider;  CAD Interventions: Long Term: goal on track Assessed understanding of CAD diagnosis Medications reviewed including medications utilized in CAD treatment plan Reviewed Importance of taking all medications as prescribed Reviewed Importance of attending all scheduled provider appointments   Patient Goals/Self-Care Activities: develop a rescue plan follow rescue plan if symptoms flare-up know when to call the doctor weight gain of 3 pounds overnight, increased swelling in stomach, feet/legs or hands. Increased shortness of breath, having less energy than normal, new or worsening dizziness, if it is harder for you to breathe when lying down and you need to sit up to breathe or you feel that something is not right.  Make an appointment with your eye doctor Monitor for signs/symptoms of COPD worsening: increase cough, shortness of breath or mucus production change  for 2 days or more, your number 1 priority is to call your doctor. eat more whole grains, fruits and vegetables, lean meats and healthy fats Attend provider visits as scheduled. Next appointment with primary care provider: 08/05/21.  Plan:Telephone follow up appointment with care management team member scheduled for:  next month The patient has been provided with contact information for the care management team and has been advised to call with any health related questions or concerns.    Thea Silversmith, RN, MSN, BSN, CCM Care Management Coordinator [facility] [phone]    I have reviewed and agree with [facility] documentation.  [name], MD  [name], MD \

## 2021-07-02 ENCOUNTER — Telehealth: Payer: Medicare HMO

## 2021-07-02 ENCOUNTER — Telehealth: Payer: Self-pay | Admitting: Pharmacist

## 2021-07-02 NOTE — Telephone Encounter (Signed)
   I received the message below from Thea Silversmith, RN on 07/02/2021: "Hi [name], I am speaking to [name] now. He says he has been out Spironolactone and Isosorbide for about 3 days and only have a few Fenofibrate left. I know that he gets his medications mail order. Can you follow up on this?"  I checked and patient should have had enough spironolactone and isosorbide to last until the beginning of December 2022. Both were filled for 90 days supply on 04/13/2021.   I tried to contact patient twice today to see if he needed me to send in a prescription to local pharmacy for a few days.  Patient did call back and speak to front office representative and states he has all his medications.  Tried to return his call but no answer. Verified with [place] [facility] pharmacy that spironolactone and isosorbide 90 days supply was shipped 06/25/2021 and tracking number indicated it was received at 2pm on 06/27/2021.  Also verified that fenofibrate was shipped for 90 days supply on 06/27/2021.

## 2021-07-07 ENCOUNTER — Ambulatory Visit: Payer: Medicare HMO | Admitting: Internal Medicine

## 2021-07-09 DIAGNOSIS — J449 Chronic obstructive pulmonary disease, unspecified: Secondary | ICD-10-CM

## 2021-07-09 DIAGNOSIS — E1159 Type 2 diabetes mellitus with other circulatory complications: Secondary | ICD-10-CM

## 2021-07-09 DIAGNOSIS — E785 Hyperlipidemia, unspecified: Secondary | ICD-10-CM | POA: Diagnosis not present

## 2021-07-09 DIAGNOSIS — I5032 Chronic diastolic (congestive) heart failure: Secondary | ICD-10-CM

## 2021-07-09 DIAGNOSIS — I110 Hypertensive heart disease with heart failure: Secondary | ICD-10-CM | POA: Diagnosis not present

## 2021-07-09 DIAGNOSIS — Z720 Tobacco use: Secondary | ICD-10-CM | POA: Diagnosis not present

## 2021-07-17 ENCOUNTER — Telehealth: Payer: Medicare HMO

## 2021-07-24 ENCOUNTER — Telehealth: Payer: Self-pay

## 2021-07-24 ENCOUNTER — Other Ambulatory Visit: Payer: Self-pay | Admitting: Internal Medicine

## 2021-07-24 NOTE — Telephone Encounter (Signed)
° °  Telephone encounter was:  Successful.  07/24/2021 Name: [name]. MRN: [id number] DOB: 10-24-1960  [name]. is a 60 y.o. year old male who is a primary care patient of Colon Branch, MD . The community resource team was consulted for assistance with [place] guide performed the following interventions: Spoke with patient verified his mailing address to send list of food  pantries. Letter in [place].  Follow Up Plan:  Care guide will follow up with patient by phone over the next 0-03 days  Brittnay Pigman, AAS Paralegal, [place] [facility]  [address], [place] [address] ??[email or web address]   ?? [phone]   [email or web address]

## 2021-07-25 ENCOUNTER — Encounter: Payer: Self-pay | Admitting: Internal Medicine

## 2021-07-31 ENCOUNTER — Telehealth: Payer: Self-pay

## 2021-07-31 NOTE — Telephone Encounter (Signed)
° °  Telephone encounter was:  Successful.  07/31/2021 Name: [name]. MRN: [id number] DOB: [birth year]  [name]. is a 60 y.o. year old male who is a primary care patient of Colon Branch, MD . The community resource team was consulted for assistance with [name] performed the following interventions: Spoke with patient he has not received the food pantry list mailed on 07/24/21. I will follow-up with patient next week.   Follow Up Plan:  Care guide will follow up with patient by phone over the next 9-53 days  [name], AAS Paralegal, [place] [facility]  [address], [place] [address] ??[name]   ?? [phone]   [email or web address]

## 2021-08-05 ENCOUNTER — Encounter: Payer: Self-pay | Admitting: Internal Medicine

## 2021-08-05 ENCOUNTER — Ambulatory Visit (INDEPENDENT_AMBULATORY_CARE_PROVIDER_SITE_OTHER): Payer: Medicare HMO | Admitting: Internal Medicine

## 2021-08-05 ENCOUNTER — Other Ambulatory Visit: Payer: Self-pay

## 2021-08-05 ENCOUNTER — Ambulatory Visit (HOSPITAL_BASED_OUTPATIENT_CLINIC_OR_DEPARTMENT_OTHER)
Admission: RE | Admit: 2021-08-05 | Discharge: 2021-08-05 | Disposition: A | Discharge status: Home / Self Care | Payer: Medicare HMO | Source: Ambulatory Visit | Attending: Internal Medicine | Admitting: Internal Medicine

## 2021-08-05 VITALS — BP 126/80 | HR 62 | Temp 98.0°F | Resp 18 | Ht 69.0 in | Wt 228.0 lb

## 2021-08-05 DIAGNOSIS — E785 Hyperlipidemia, unspecified: Secondary | ICD-10-CM | POA: Diagnosis not present

## 2021-08-05 DIAGNOSIS — R0902 Hypoxemia: Secondary | ICD-10-CM | POA: Diagnosis not present

## 2021-08-05 DIAGNOSIS — I5032 Chronic diastolic (congestive) heart failure: Secondary | ICD-10-CM

## 2021-08-05 DIAGNOSIS — I509 Heart failure, unspecified: Secondary | ICD-10-CM | POA: Diagnosis not present

## 2021-08-05 DIAGNOSIS — R0989 Other specified symptoms and signs involving the circulatory and respiratory systems: Secondary | ICD-10-CM | POA: Diagnosis not present

## 2021-08-05 DIAGNOSIS — E1140 Type 2 diabetes mellitus with diabetic neuropathy, unspecified: Secondary | ICD-10-CM | POA: Diagnosis not present

## 2021-08-05 DIAGNOSIS — E1151 Type 2 diabetes mellitus with diabetic peripheral angiopathy without gangrene: Secondary | ICD-10-CM

## 2021-08-05 DIAGNOSIS — J811 Chronic pulmonary edema: Secondary | ICD-10-CM | POA: Diagnosis not present

## 2021-08-05 DIAGNOSIS — K449 Diaphragmatic hernia without obstruction or gangrene: Secondary | ICD-10-CM | POA: Diagnosis not present

## 2021-08-05 MED ORDER — FUROSEMIDE 20 MG PO TABS: 40.0000 mg | ORAL_TABLET | Freq: Every day | ORAL | Status: DC

## 2021-08-05 MED ORDER — BETAMETHASONE DIPROPIONATE AUG 0.05 % EX CREA: TOPICAL_CREAM | Freq: Two times a day (BID) | CUTANEOUS | 0 refills | Status: DC

## 2021-08-05 MED ORDER — FUROSEMIDE 20 MG PO TABS: 20.0000 mg | ORAL_TABLET | Freq: Every day | ORAL | Status: DC

## 2021-08-05 NOTE — Progress Notes (Signed)
Subjective:    Patient ID: [name]., male    DOB: Sep 04, 1960, 60 y.o.   MRN: [id number]  DOS:  08/05/2021 Type of visit - description: Follow-up, here with his sister  We discussed multiple issues  Compliance with [facility]: He is taking much less than what is prescribed, only 1 tablet daily b/c if he takes 3 tabs qd he "urinates too much". Weight gain noted, the sister also noticed some leg swelling and more difficulty breathing than usual. He has oxygen at home but is not using it.  Handicap sticker needed.  Report numbness at the right foot for a while, he thinks is related with 24 or 40-year-old fracture on that foot.  Has occasional difficulty swallowing.  Has a rash at the left leg.     Wt Readings from Last 3 Encounters:  08/05/21 228 lb (103.4 kg)  05/07/21 216 lb 8 oz (98.2 kg)  04/11/21 221 lb 12.5 oz (100.6 kg)      Review of Systems See above   Past Medical History:  Diagnosis Date   (HFpEF) heart failure with preserved ejection fraction ([place])    Echo 6/22: EF 55-60, no RWMA, GR 3 DD, elevated LVEDP, normal RVSF, RVSP 53.2, mild to moderate AV sclerosis without stenosis, dilated aortic root (41 mm), ascending aorta 37 mm   Abnormality of gait    Anxiety    CAD (coronary artery disease)    a. 10/2008 Inferolateral STEMI: 3VD w/ occluded LCX (PTCA)-->CABG x 3 (LIMA->LAD, VG->OM, VG->PDA);  b. 06/2009 NSTEMI/PCI: VG->OM 100 (BMS);  c. 05/2013 native 3VD, 3/3 patent grafts. d. 06/2016: Inferior STEMI w/ occluded SVG-OM (DES placed). LIMA-LAD and SVG-PDA patent.   Chronic bronchitis (HCC)    Chronic diastolic CHF (congestive heart failure) ([place])    a. 02/2015 Echo: EF 50-55%, distal septal, apical, inferobasal HK, mild LVH, mild MR, mildly dil LA.   Cocaine use    Colon polyps    a. 09/2015 colonscopy: multiple sessile polyps, path negative for high grade dysplasia.   Depression    Emphysema (subcutaneous) (surgical) resulting from a procedure 10/2008.    Bleb resected at time of 10/2008 CABG. Marketed emphysema noted on surgical report.   Esophagitis    a. 2017 EGD: esophagitis, duodenitis.   ETOH abuse    GERD (gastroesophageal reflux disease)    Hemianopia, homonymous, right    "can't see out the right side of either eye since stroke in 2016/pt description 09/23/2016   Hepatic steatosis    Hiatal hernia    History of nuclear stress test 08/2017   Nuclear stress test 1/19: EF 35, inf-lat and ant-lat, apical sept/apical scar; no ischemia; Intermediate Risk   Hyperlipidemia    Hypertension    Iron deficiency anemia 2010   Morbid obesity ([place])    Myocardial infarction [facility]) "several"   PAD (peripheral artery disease) ([place])    Pancreatitis 06/2015   Sleep apnea    "don't have a mask" (09/23/2016)   Stroke ([place]) 02/2015   a. 02/2015 Carotid U/S: 1-39% bilat ICA stenosis; "left me blind" (09/23/2016)   Tobacco abuse    still smokes   Tubular adenoma of colon     Past Surgical History:  Procedure Laterality Date   ABDOMINAL AORTOGRAM W/LOWER EXTREMITY N/A 09/23/2016   Procedure: Abdominal Aortogram w/Lower Extremity;  Surgeon: Wellington Hampshire, MD;  Location: [place] [facility];  Service: Cardiovascular;  Laterality: N/A;   CARDIAC CATHETERIZATION  11/07/2008   EF of 50-55% --  normal LV systolic function, acute inferolateral ST elevation MI,  PTCA of the circumflex artery -- [name]. [name], [name]   CARDIAC CATHETERIZATION N/A 07/07/2016   Procedure: Left Heart Cath and Cors/Grafts Angiography;  Surgeon: [name], MD;  Location: [place] [facility];  Service: Cardiovascular;  Laterality: N/A;   CARDIAC CATHETERIZATION N/A 07/07/2016   Procedure: Coronary Stent Intervention;  Surgeon: [name], MD;  Location: [place] [facility];  Service: Cardiovascular;  Laterality: N/A;   CORONARY ANGIOPLASTY     CORONARY ARTERY BYPASS GRAFT     "CABG X4"   LEFT HEART CATH AND CORS/GRAFTS ANGIOGRAPHY N/A 10/01/2017   Procedure: LEFT HEART  CATH AND CORS/GRAFTS ANGIOGRAPHY;  Surgeon: Burnell Blanks, MD;  Location: [place] [facility];  Service: Cardiovascular;  Laterality: N/A;   LEFT HEART CATHETERIZATION WITH CORONARY/GRAFT ANGIOGRAM N/A 05/15/2013   Procedure: LEFT HEART CATHETERIZATION WITH Beatrix Fetters;  Surgeon: [name], MD;  Location: [facility];  Service: Cardiovascular;  Laterality: N/A;   PERIPHERAL VASCULAR INTERVENTION  09/23/2016   Procedure: Peripheral Vascular Intervention;  Surgeon: Wellington Hampshire, MD;  Location: [place] [facility];  Service: Cardiovascular;;  Lt. SFA    Allergies as of 08/05/2021       Reactions   Bupropion Nausea Only        Medication List        Accurate as of August 05, 2021  2:24 PM. If you have any questions, ask your nurse or doctor.          aspirin 81 MG EC tablet Take 1 tablet (81 mg total) by mouth daily with breakfast.   atorvastatin 80 MG tablet Commonly known as: LIPITOR TAKE 1 TABLET (80 MG TOTAL) BY MOUTH DAILY AT 6 PM. NEED TO KEEP APPOINTMENT FOR ANY FUTURE REFILLS.   budesonide-formoterol 160-4.5 MCG/ACT inhaler Commonly known as: SYMBICORT Inhale 2 puffs into the lungs 2 (two) times daily.   clopidogrel 75 MG tablet Commonly known as: PLAVIX TAKE 1 TABLET EVERY DAY   ezetimibe 10 MG tablet Commonly known as: ZETIA Take 1 tablet (10 mg total) by mouth daily.   fenofibrate 145 MG tablet Commonly known as: TRICOR TAKE 1 TABLET EVERY DAY   folic acid 1 MG tablet Commonly known as: FOLVITE Take 1 tablet (1 mg total) by mouth daily.   furosemide 20 MG tablet Commonly known as: LASIX Take 3 tablets (60 mg total) by mouth daily.   isosorbide mononitrate 60 MG 24 hr tablet Commonly known as: IMDUR Take 1.5 tablets (90 mg total) by mouth daily.   MELATONIN GUMMIES PO Take 5 mg by mouth at bedtime as needed.   metoprolol tartrate 25 MG tablet Commonly known as: LOPRESSOR TAKE 1 TABLET TWICE DAILY   nitroGLYCERIN  0.4 MG SL tablet Commonly known as: Nitrostat Place 1 tablet (0.4 mg total) under the tongue every 5 (five) minutes x 3 doses as needed for chest pain.   pantoprazole 40 MG tablet Commonly known as: PROTONIX TAKE 1 TABLET TWICE DAILY   spironolactone 25 MG tablet Commonly known as: ALDACTONE Take 0.5 tablets (12.5 mg total) by mouth daily.   tamsulosin 0.4 MG Caps capsule Commonly known as: FLOMAX Take 0.4 mg by mouth at bedtime.   thiamine 100 MG tablet Take 1 tablet (100 mg total) by mouth daily.   traZODone 100 MG tablet Commonly known as: DESYREL Take 1 tablet (100 mg total) by mouth at bedtime.   valsartan 80 MG  tablet Commonly known as: Diovan Take 1 tablet (80 mg total) by mouth daily.           Objective:   Physical Exam BP 126/80 (BP Location: Left Arm, Patient Position: Sitting, Cuff Size: Small)    Pulse 62    Temp 98 F (36.7 C) (Oral)    Resp 18    Ht 5\' 9"  (1.753 m)    Wt 228 lb (103.4 kg)    SpO2 90%    BMI 33.67 kg/m  General:   Well developed, NAD, BMI noted. HEENT:  Normocephalic . Face symmetric, atraumatic. Neck: No JVD at 45 degrees prolonged expiratory time, no wheezes, decreased breath sounds at baseline Lungs:  CTA B Normal respiratory effort, no intercostal retractions, no accessory muscle use. Heart: RRR,  no murmur.  DM foot exam: +/+++ Edema in both lower extremity up to mid way the pretibial area. Pedal pulses present bilaterally Pinprick examination: Decreased sensation distally more noticeable on the right Skin: Nummular erythematous, scaly lesion at the left Achilles tendon area Neurologic:  alert & oriented X3.  Speech normal, gait appropriate for age and unassisted Psych--  Cognition and judgment appear intact.  Cooperative with normal attention span and concentration.  Behavior appropriate. No anxious or depressed appearing.      Assessment    Assessment   DM HTN Dyslipidemia CV: --CAD: 2010 angioplasty f/u by   CABG 2010, cath 2014 -- CP, cath 09/2017, patent grafts, medical treatment --PVD --Stroke 2008, 02-2015, 2020 -CHF: Heart failure with preserved EF Anxiety depression OSA: Sleep study 01/2017, significant sleep apnea noted Abnormal gait GI: Iron def anemia dx 03-2015  ---cscope 09-2015 multiple polyps, repeat in one year ---EGD-2017: Esophagitis, duodenitis, bx done Pancreatitis 06/2015.(EtOH 2 days prior to onset of symptoms) Etoh abuse H/o cocaine  Smoker  +FH: father prostate ca , dx late 60s Back pain:saw Ortho 01/2020, SXS felt to be mechanical due to: JD, deconditioning, residual weakness from the stroke.  PVD likely to be contributing. COPD: Clinical diagnosis, has not pursued PFTs  PLAN DM: Last A1c 6.7.  On no medications. Neuropathy: The patient reports R foot numbness, on exam he has good pedal pulses, decreased pinprick examination of both feet.  Suspect this is diabetic neuropathy, feet care discussed. High cholesterol: Last LDL was satisfactory.    HFpEF:  Has a history of heart failure, on metoprolol, Aldactone, Diovan, Lasix.  Self decrease Lasix from 60 mg to 20 mg a while back due to "making him urinate too much". No JVD on exam, + lower extremity edema, + weight gain, decreased breath sounds at bases.  No distress however. Plan: Recommend to go to Lasix 60 mg every day for 5 days, then we will compromise and take 40 mg a day.  If he is not losing weight he is recommended to let me know. Chest x-ray today to better assess volume status. Rash: Rash, likely eczema, recommend Diprolene. OSA, hypoxemia, COPD: He is hypoxemic today, more short of breath than usual, probably a combination of CHF and not using oxygen.  Strongly recommend to use oxygen as recommended. Handicap sticker provided Speech therapy referral?  Was recommended by her clinical pharmacist to get a referral, will discuss with her. Preventive care: Recommend flu, PNM 20 and COVID vaccines.  Benefits  discussed, strongly declines. All of the involved carefully discussed with the patient and his sister.  Multiple questions answered RTC 1 month.  Time spent 35 minutes  This visit occurred during the SARS-CoV-2  public health emergency.  Safety protocols were in place, including screening questions prior to the visit, additional usage of staff PPE, and extensive cleaning of exam room while observing appropriate contact time as indicated for disinfecting solutions.

## 2021-08-05 NOTE — Patient Instructions (Addendum)
Take Lasix 20 mg: 3 tablets for the next 5 days Then okay to decrease to 2 tablets daily. Watch her salt intake Weight yourself daily and if you are not losing weight in the next 10 days please reach out to me.   Apply the cream twice daily to the rash (sent to [facility])  Use your oxygen supplements consistently  Vaccines I recommend: Influenza, pneumonia and COVID     GO TO THE FRONT DESK, PLEASE SCHEDULE YOUR APPOINTMENTS Come back for   a checkup in 1 month, sooner if needed  STOP BY THE FIRST FLOOR:  get the XR    Diabetes Mellitus and [place] care is an important part of your health, especially when you have diabetes. Diabetes may cause you to have problems because of poor blood flow (circulation) to your feet and legs, which can cause your skin to: Become thinner and drier. Break more easily. Heal more slowly. Peel and crack. You may also have nerve damage (neuropathy) in your legs and feet, causing decreased feeling in them. This means that you may not notice minor injuries to your feet that could lead to more serious problems. Noticing and addressing any potential problems early is the best way to prevent future foot problems. How to care for your feet Foot hygiene  Wash your feet daily with warm water and mild soap. Do not use hot water. Then, pat your feet and the areas between your toes until they are completely dry. Do not soak your feet as this can dry your skin. Trim your toenails straight across. Do not dig under them or around the cuticle. File the edges of your nails with an emery board or nail file. Apply a moisturizing lotion or petroleum jelly to the skin on your feet and to dry, brittle toenails. Use lotion that does not contain alcohol and is unscented. Do not apply lotion between your toes. Shoes and socks Wear clean socks or stockings every day. Make sure they are not too tight. Do not wear knee-high stockings since they may decrease blood flow  to your legs. Wear shoes that fit properly and have enough cushioning. Always look in your shoes before you put them on to be sure there are no objects inside. To break in new shoes, wear them for just a few hours a day. This prevents injuries on your feet. Wounds, scrapes, corns, and calluses  Check your feet daily for blisters, cuts, bruises, sores, and redness. If you cannot see the bottom of your feet, use a mirror or ask someone for help. Do not cut corns or calluses or try to remove them with medicine. If you find a minor scrape, cut, or break in the skin on your feet, keep it and the skin around it clean and dry. You may clean these areas with mild soap and water. Do not clean the area with peroxide, alcohol, or iodine. If you have a wound, scrape, corn, or callus on your foot, look at it several times a day to make sure it is healing and not infected. Check for: Redness, swelling, or pain. Fluid or blood. Warmth. Pus or a bad smell. General tips Do not cross your legs. This may decrease blood flow to your feet. Do not use heating pads or hot water bottles on your feet. They may burn your skin. If you have lost feeling in your feet or legs, you may not know this is happening until it is too late. Protect your  feet from hot and cold by wearing shoes, such as at the beach or on hot pavement. Schedule a complete foot exam at least once a year (annually) or more often if you have foot problems. Report any cuts, sores, or bruises to your health care provider immediately. Where to find more information American Diabetes Association: www.diabetes.org Association of Diabetes Care & Education Specialists: www.diabeteseducator.org Contact a health care provider if: You have a medical condition that increases your risk of infection and you have any cuts, sores, or bruises on your feet. You have an injury that is not healing. You have redness on your legs or feet. You feel burning or tingling in  your legs or feet. You have pain or cramps in your legs and feet. Your legs or feet are numb. Your feet always feel cold. You have pain around any toenails. Get help right away if: You have a wound, scrape, corn, or callus on your foot and: You have pain, swelling, or redness that gets worse. You have fluid or blood coming from the wound, scrape, corn, or callus. Your wound, scrape, corn, or callus feels warm to the touch. You have pus or a bad smell coming from the wound, scrape, corn, or callus. You have a fever. You have a red line going up your leg. Summary Check your feet every day for blisters, cuts, bruises, sores, and redness. Apply a moisturizing lotion or petroleum jelly to the skin on your feet and to dry, brittle toenails. Wear shoes that fit properly and have enough cushioning. If you have foot problems, report any cuts, sores, or bruises to your health care provider immediately. Schedule a complete foot exam at least once a year (annually) or more often if you have foot problems. This information is not intended to replace advice given to you by your health care provider. Make sure you discuss any questions you have with your health care provider. Document Revised: 02/15/2020 Document Reviewed: 02/15/2020 Elsevier Patient Education  [place].

## 2021-08-06 NOTE — Assessment & Plan Note (Signed)
DM: Last A1c 6.7.  On no medications. Neuropathy: The patient reports R foot numbness, on exam he has good pedal pulses, decreased pinprick examination of both feet.  Suspect this is diabetic neuropathy, feet care discussed. High cholesterol: Last LDL was satisfactory.    HFpEF:  Has a history of heart failure, on metoprolol, Aldactone, Diovan, Lasix.  Self decrease Lasix from 60 mg to 20 mg a while back due to "making him urinate too much". No JVD on exam, + lower extremity edema, + weight gain, decreased breath sounds at bases.  No distress however. Plan: Recommend to go to Lasix 60 mg every day for 5 days, then we will compromise and take 40 mg a day.  If he is not losing weight he is recommended to let me know. Chest x-ray today to better assess volume status. Rash: Rash, likely eczema, recommend Diprolene. OSA, hypoxemia, COPD: He is hypoxemic today, more short of breath than usual, probably a combination of CHF and not using oxygen.  Strongly recommend to use oxygen as recommended. Handicap sticker provided Speech therapy referral?  Was recommended by her clinical pharmacist to get a referral, will discuss with her. Preventive care: Recommend flu, PNM 20 and COVID vaccines.  Benefits discussed, strongly declines. All of the involved carefully discussed with the patient and his sister.  Multiple questions answered RTC 1 month.

## 2021-08-07 ENCOUNTER — Telehealth: Payer: Medicare HMO

## 2021-08-07 ENCOUNTER — Telehealth: Payer: Self-pay

## 2021-08-07 NOTE — Telephone Encounter (Signed)
° °  Telephone encounter was:  Unsuccessful.  08/07/2021 Name: [name]. MRN: [id number] DOB: 02-03-1961  Unsuccessful outbound call made today to assist with:  Left message on voicemail for patient to return my call regarding mailed food pantry list.   Outreach Attempt:  3rd Attempt.  Referral closed unable to contact patient.  A HIPAA compliant voice message was left requesting a return call.  Instructed patient to call back at [phone].  Kenedy Haisley, AAS Paralegal, [facility]  [address], [place] [address] ??[email or web address]   ?? [phone]   [email or web address]

## 2021-08-07 NOTE — Telephone Encounter (Signed)
° °  Telephone encounter was:  Unsuccessful.  08/07/2021 Name: [name]. MRN: [id number] DOB: [birth year]  Unsuccessful outbound call made today to assist with:  Food Insecurity  Outreach Attempt:  2nd Attempt  A HIPAA compliant voice message was left requesting a return call.  Instructed patient to call back at [phone].  Andry Bogden, AAS Paralegal, [facility]  [address], [place] [address] ??[email or web address]   ?? [phone]   [email or web address]

## 2021-08-07 NOTE — Telephone Encounter (Signed)
°  Care Management   Follow Up Note   08/07/2021 Name: [name]. MRN: [id number] DOB: 03/28/1961   Referred by: Colon Branch, MD Reason for referral : No chief complaint on file.   An unsuccessful telephone outreach was attempted today. The patient was referred to the case management team for assistance with care management and care coordination.  A HIPPA compliant phone message was left for the patient providing contact information and requesting a return call.   Follow Up Plan: Care Management Team will outreach within the next 30 days.  Thea Silversmith, RN, MSN, BSN, CCM Care Management Coordinator [facility] [phone]

## 2021-08-13 ENCOUNTER — Telehealth: Payer: Self-pay | Admitting: Internal Medicine

## 2021-08-13 DIAGNOSIS — R1310 Dysphagia, unspecified: Secondary | ICD-10-CM

## 2021-08-13 NOTE — Telephone Encounter (Signed)
Spoke w/ Pt- informed that speech therapy referral has been placed. Pt verbalized understanding.

## 2021-08-13 NOTE — Telephone Encounter (Signed)
Note from our clinical pharmacist:    Back in October his sister mentioned that he was having difficulty swallowing and speaking. I had set up an appointment to see you then for evaluation but he cancelled. He had a swallowing study 09/2020 and it was recommended that he have speech therapy. I had recommended he reconsider speech therapy and may have mentioned that might need another test before the referral but I did not know for certain.    Please call patient, let her know we are putting a referral for speech therapy, DX difficulty swallowing,

## 2021-08-15 ENCOUNTER — Telehealth: Payer: Self-pay | Admitting: *Deleted

## 2021-08-15 NOTE — Chronic Care Management (AMB) (Signed)
°  Care Management   Note  08/15/2021 Name: [name]. MRN: [id number] DOB: [birth year]  [name] is a 61 y.o. year old male who is a primary care patient of Colon Branch, MD and is actively engaged with the care management team. I reached out to [name] by phone today to assist with re-scheduling a follow up visit with the RN Case Manager  Follow up plan: Unsuccessful telephone outreach attempt made. A HIPAA compliant phone message was left for the patient providing contact information and requesting a return call.  If patient returns call to provider office, please advise to call Embedded Care Management Care Guide Tavon Magnussen at [place], [place]  Direct Dial: [phone]

## 2021-08-21 ENCOUNTER — Other Ambulatory Visit: Payer: Self-pay | Admitting: Internal Medicine

## 2021-08-22 ENCOUNTER — Other Ambulatory Visit: Payer: Self-pay | Admitting: Internal Medicine

## 2021-08-25 DIAGNOSIS — N401 Enlarged prostate with lower urinary tract symptoms: Secondary | ICD-10-CM | POA: Diagnosis not present

## 2021-08-25 DIAGNOSIS — R3914 Feeling of incomplete bladder emptying: Secondary | ICD-10-CM | POA: Diagnosis not present

## 2021-08-26 ENCOUNTER — Ambulatory Visit: Payer: Medicare HMO | Admitting: Physician Assistant

## 2021-09-01 ENCOUNTER — Encounter: Payer: Self-pay | Admitting: Internal Medicine

## 2021-09-01 DIAGNOSIS — J449 Chronic obstructive pulmonary disease, unspecified: Secondary | ICD-10-CM

## 2021-09-01 DIAGNOSIS — R490 Dysphonia: Secondary | ICD-10-CM

## 2021-09-01 DIAGNOSIS — R1310 Dysphagia, unspecified: Secondary | ICD-10-CM

## 2021-09-01 NOTE — Chronic Care Management (AMB) (Signed)
°  Care Management   Note  09/01/2021 Name: Sonya Pucci. MRN: [id number] DOB: [birth year]  [name] is a 61 y.o. year old male who is a primary care patient of Colon Branch, MD and is actively engaged with the care management team. I reached out to [name] by phone today to assist with re-scheduling a follow up visit with the RN Case Manager  Follow up plan: Telephone appointment with care management team member scheduled for: 09/09/2021  Julian Hy, [place], [place] [facility]  Direct Dial: [phone]

## 2021-09-03 ENCOUNTER — Telehealth (HOSPITAL_COMMUNITY): Payer: Self-pay

## 2021-09-03 NOTE — Telephone Encounter (Signed)
Attempted to contact patient to schedule OP MBS - left voicemail. ?

## 2021-09-05 ENCOUNTER — Ambulatory Visit (INDEPENDENT_AMBULATORY_CARE_PROVIDER_SITE_OTHER): Payer: Medicare HMO | Admitting: Internal Medicine

## 2021-09-05 ENCOUNTER — Encounter: Payer: Self-pay | Admitting: Internal Medicine

## 2021-09-05 VITALS — BP 122/68 | HR 56 | Temp 97.9°F | Resp 20 | Ht 69.0 in | Wt 224.2 lb

## 2021-09-05 DIAGNOSIS — E1151 Type 2 diabetes mellitus with diabetic peripheral angiopathy without gangrene: Secondary | ICD-10-CM | POA: Diagnosis not present

## 2021-09-05 DIAGNOSIS — I10 Essential (primary) hypertension: Secondary | ICD-10-CM

## 2021-09-05 DIAGNOSIS — N289 Disorder of kidney and ureter, unspecified: Secondary | ICD-10-CM

## 2021-09-05 DIAGNOSIS — I5032 Chronic diastolic (congestive) heart failure: Secondary | ICD-10-CM | POA: Diagnosis not present

## 2021-09-05 MED ORDER — GABAPENTIN 100 MG PO CAPS: 100.0000 mg | ORAL_CAPSULE | Freq: Three times a day (TID) | ORAL | 3 refills | Status: DC

## 2021-09-05 NOTE — Progress Notes (Signed)
Subjective:    Patient ID: [name]., male    DOB: 07-12-1961, 61 y.o.   MRN: [id number]  DOS:  09/05/2021 Type of visit - description: Follow-up  Since the last office visit is doing okay has no new concerns. Reports good compliance with medication. Still taking 3 tablets of Lasix daily. He remains CPAP intolerant. Requested trial with gabapentin for neuropathy  Wt Readings from Last 3 Encounters:  09/05/21 224 lb 4 oz (101.7 kg)  08/05/21 228 lb (103.4 kg)  05/07/21 216 lb 8 oz (98.2 kg)     Review of Systems See above   Past Medical History:  Diagnosis Date   (HFpEF) heart failure with preserved ejection fraction ([place])    Echo 6/22: EF 55-60, no RWMA, GR 3 DD, elevated LVEDP, normal RVSF, RVSP 53.2, mild to moderate AV sclerosis without stenosis, dilated aortic root (41 mm), ascending aorta 37 mm   Abnormality of gait    Anxiety    CAD (coronary artery disease)    a. 10/2008 Inferolateral STEMI: 3VD w/ occluded LCX (PTCA)-->CABG x 3 (LIMA->LAD, VG->OM, VG->PDA);  b. 06/2009 NSTEMI/PCI: VG->OM 100 ([facility]);  c. 05/2013 native 3VD, 3/3 patent grafts. d. 06/2016: Inferior STEMI w/ occluded SVG-OM (DES placed). LIMA-LAD and SVG-PDA patent.   Chronic bronchitis (HCC)    Chronic diastolic CHF (congestive heart failure) ([place])    a. 02/2015 Echo: EF 50-55%, distal septal, apical, inferobasal HK, mild LVH, mild MR, mildly dil LA.   Cocaine use    Colon polyps    a. 09/2015 colonscopy: multiple sessile polyps, path negative for high grade dysplasia.   Depression    Emphysema (subcutaneous) (surgical) resulting from a procedure 10/2008.   Bleb resected at time of 10/2008 CABG. Marketed emphysema noted on surgical report.   Esophagitis    a. 2017 EGD: esophagitis, duodenitis.   ETOH abuse    GERD (gastroesophageal reflux disease)    Hemianopia, homonymous, right    "can't see out the right side of either eye since stroke in 2016/pt description 09/23/2016   Hepatic steatosis     Hiatal hernia    History of nuclear stress test 08/2017   Nuclear stress test 1/19: EF 35, inf-lat and ant-lat, apical sept/apical scar; no ischemia; Intermediate Risk   Hyperlipidemia    Hypertension    Iron deficiency anemia 2010   Morbid obesity ([place])    Myocardial infarction [facility]) "several"   PAD (peripheral artery disease) ([place])    Pancreatitis 06/2015   Sleep apnea    "don't have a mask" (09/23/2016)   Stroke ([place]) 02/2015   a. 02/2015 Carotid U/S: 1-39% bilat ICA stenosis; "left me blind" (09/23/2016)   Tobacco abuse    still smokes   Tubular adenoma of colon     Past Surgical History:  Procedure Laterality Date   ABDOMINAL AORTOGRAM W/LOWER EXTREMITY N/A 09/23/2016   Procedure: Abdominal Aortogram w/Lower Extremity;  Surgeon: Wellington Hampshire, MD;  Location: [place] [facility];  Service: Cardiovascular;  Laterality: N/A;   CARDIAC CATHETERIZATION  11/07/2008   EF of 50-55% -- normal LV systolic function, acute inferolateral ST elevation MI,  PTCA of the circumflex artery -- [name]. [name], [name]   CARDIAC CATHETERIZATION N/A 07/07/2016   Procedure: Left Heart Cath and Cors/Grafts Angiography;  Surgeon: [name], MD;  Location: [place];  Service: Cardiovascular;  Laterality: N/A;   CARDIAC CATHETERIZATION N/A 07/07/2016   Procedure: Coronary Stent Intervention;  Surgeon: [name],  MD;  Location: [place] [facility];  Service: Cardiovascular;  Laterality: N/A;   CORONARY ANGIOPLASTY     CORONARY ARTERY BYPASS GRAFT     "CABG X4"   LEFT HEART CATH AND CORS/GRAFTS ANGIOGRAPHY N/A 10/01/2017   Procedure: LEFT HEART CATH AND CORS/GRAFTS ANGIOGRAPHY;  Surgeon: Burnell Blanks, MD;  Location: [place];  Service: Cardiovascular;  Laterality: N/A;   LEFT HEART CATHETERIZATION WITH CORONARY/GRAFT ANGIOGRAM N/A 05/15/2013   Procedure: LEFT HEART CATHETERIZATION WITH Beatrix Fetters;  Surgeon: [name], MD;  Location: [facility];   Service: Cardiovascular;  Laterality: N/A;   PERIPHERAL VASCULAR INTERVENTION  09/23/2016   Procedure: Peripheral Vascular Intervention;  Surgeon: Wellington Hampshire, MD;  Location: [place] [facility];  Service: Cardiovascular;;  Lt. SFA    Current Outpatient Medications  Medication Instructions   aspirin 81 mg, Oral, Daily with breakfast   atorvastatin (LIPITOR) 80 MG tablet TAKE 1 TABLET (80 MG TOTAL) BY MOUTH DAILY AT 6 PM. NEED TO KEEP APPOINTMENT FOR ANY FUTURE REFILLS.   augmented betamethasone dipropionate (DIPROLENE-AF) 0.05 % cream APPLY TOPICALLY TWICE DAILY   budesonide-formoterol (SYMBICORT) 160-4.5 MCG/ACT inhaler 2 puffs, Inhalation, 2 times daily   clopidogrel (PLAVIX) 75 MG tablet TAKE 1 TABLET EVERY DAY   ezetimibe (ZETIA) 10 mg, Oral, Daily   fenofibrate (TRICOR) 145 MG tablet TAKE 1 TABLET EVERY DAY   folic acid (FOLVITE) 1 mg, Oral, Daily   furosemide (LASIX) 20 MG tablet Take 2 tablets (40 mg total) by mouth in the morning AND 1 tablet (20 mg total) every evening.   gabapentin (NEURONTIN) 100 mg, Oral, 3 times daily   isosorbide mononitrate (IMDUR) 90 mg, Oral, Daily   MELATONIN GUMMIES PO 5 mg, Oral, At bedtime PRN   metoprolol tartrate (LOPRESSOR) 25 MG tablet TAKE 1 TABLET TWICE DAILY   nitroGLYCERIN (NITROSTAT) 0.4 mg, Sublingual, Every 5 min x3 PRN   pantoprazole (PROTONIX) 40 MG tablet TAKE 1 TABLET TWICE DAILY   spironolactone (ALDACTONE) 12.5 mg, Oral, Daily   tamsulosin (FLOMAX) 0.4 mg, Oral, Daily at bedtime   thiamine 100 mg, Oral, Daily   traZODone (DESYREL) 100 mg, Oral, Daily at bedtime   valsartan (DIOVAN) 80 mg, Oral, Daily       Objective:   Physical Exam BP 122/68 (BP Location: Left Arm, Patient Position: Sitting, Cuff Size: Normal)    Pulse (!) 56    Temp 97.9 F (36.6 C) (Oral)    Resp 20    Ht 5\' 9"  (1.753 m)    Wt 224 lb 4 oz (101.7 kg)    SpO2 93%    BMI 33.12 kg/m  General:   Well developed, NAD, BMI noted. HEENT:  Normocephalic . Face  symmetric, atraumatic Lungs:  CTA B Normal respiratory effort, no intercostal retractions, no accessory muscle use. Heart: RRR,  no murmur.  Lower extremities: no pretibial edema bilaterally  Skin: Not pale. Not jaundice Neurologic:  alert & oriented X3.  Speech normal, gait appropriate for age and unassisted Psych--  Cognition and judgment appear intact.  Cooperative with normal attention span and concentration.  Behavior appropriate. No anxious or depressed appearing.      Assessment      Assessment   DM HTN Dyslipidemia CV: --CAD: 2010 angioplasty f/u by  CABG 2010, cath 2014 -- CP, cath 09/2017, patent grafts, medical treatment --PVD --Stroke 2008, 02-2015, 2020 -CHF: Heart failure with preserved EF Anxiety depression OSA: Sleep study 01/2017, significant sleep apnea noted  Abnormal gait GI: Iron def anemia dx 03-2015  ---cscope 09-2015 multiple polyps, repeat in one year ---EGD-2017: Esophagitis, duodenitis, bx done Pancreatitis 06/2015.(EtOH 2 days prior to onset of symptoms) Etoh abuse H/o cocaine  Smoker  +FH: father prostate ca , dx late 60s Back pain:saw Ortho 01/2020, SXS felt to be mechanical due to: JD, deconditioning, residual weakness from the stroke.  PVD likely to be contributing. COPD: Clinical diagnosis, has not pursued PFTs  PLAN Follow-up from previous visit DM: Diet controlled, check A1c. Neuropathy: Request gabapentin trial.  We will start with 100 mg 3 times daily HFpEF: See last visit, he was not well controlled, at that time the chest x-ray showed vascular congestion,  he had a low O2 sat. Since then he is taking medications regularly including Lasix : was recommended 40 mg daily, he is actually taking 40 mg in the morning and 20 in the afternoon He seems better.  Weight decrease, no edema today.  He also takes Imdur, metoprolol, Aldactone, Diovan.  Check BMP-CBC Preventive care: Strongly declines a flu shot RTC 3 months.     This visit  occurred during the SARS-CoV-2 public health emergency.  Safety protocols were in place, including screening questions prior to the visit, additional usage of staff PPE, and extensive cleaning of exam room while observing appropriate contact time as indicated for disinfecting solutions.

## 2021-09-05 NOTE — Patient Instructions (Addendum)
To schedule Modified Barium Swallow- please call [phone].    Continue taking the medications that you are doing.  Start gabapentin for neuropathy: 1 tablet 3 times a day. Watch for feeling sleepy. We could go up on the dose if needed  Check the  blood pressure regularly BP GOAL is between 110/65 and  135/85. If it is consistently higher or lower, let me know    GO TO THE LAB : Get the blood work     [place], [place] back for a checkup in 3 months

## 2021-09-06 LAB — CBC WITH DIFFERENTIAL/PLATELET
Absolute Monocytes: 1092 cells/uL — ABNORMAL HIGH (ref 200–950)
Basophils Absolute: 36 cells/uL (ref 0–200)
Basophils Relative: 0.4 %
Eosinophils Absolute: 155 cells/uL (ref 15–500)
Eosinophils Relative: 1.7 %
HCT: 47.8 % (ref 38.5–50.0)
Hemoglobin: 16.2 g/dL (ref 13.2–17.1)
Lymphs Abs: 1383 cells/uL (ref 850–3900)
MCH: 33.3 pg — ABNORMAL HIGH (ref 27.0–33.0)
MCHC: 33.9 g/dL (ref 32.0–36.0)
MCV: 98.2 fL (ref 80.0–100.0)
MPV: 10.9 fL (ref 7.5–12.5)
Monocytes Relative: 12 %
Neutro Abs: 6434 cells/uL (ref 1500–7800)
Neutrophils Relative %: 70.7 %
Platelets: 209 10*3/uL (ref 140–400)
RBC: 4.87 10*6/uL (ref 4.20–5.80)
RDW: 13.5 % (ref 11.0–15.0)
Total Lymphocyte: 15.2 %
WBC: 9.1 10*3/uL (ref 3.8–10.8)

## 2021-09-06 LAB — BASIC METABOLIC PANEL
BUN/Creatinine Ratio: 17 (calc) (ref 6–22)
BUN: 27 mg/dL — ABNORMAL HIGH (ref 7–25)
CO2: 32 mmol/L (ref 20–32)
Calcium: 9.7 mg/dL (ref 8.6–10.3)
Chloride: 100 mmol/L (ref 98–110)
Creat: 1.61 mg/dL — ABNORMAL HIGH (ref 0.70–1.30)
Glucose, Bld: 94 mg/dL (ref 65–99)
Potassium: 4.3 mmol/L (ref 3.5–5.3)
Sodium: 140 mmol/L (ref 135–146)

## 2021-09-06 LAB — HEMOGLOBIN A1C
Hgb A1c MFr Bld: 5.9 % of total Hgb — ABNORMAL HIGH (ref <5.7)
Mean Plasma Glucose: 123 mg/dL
eAG (mmol/L): 6.8 mmol/L

## 2021-09-06 NOTE — Assessment & Plan Note (Signed)
Follow-up from previous visit DM: Diet controlled, check A1c. Neuropathy: Request gabapentin trial.  We will start with 100 mg 3 times daily HFpEF: See last visit, he was not well controlled, at that time the chest x-ray showed vascular congestion,  he had a low O2 sat. Since then he is taking medications regularly including Lasix : was recommended 40 mg daily, he is actually taking 40 mg in the morning and 20 in the afternoon He seems better.  Weight decrease, no edema today.  He also takes Imdur, metoprolol, Aldactone, Diovan.  Check BMP-CBC Preventive care: Strongly declines a flu shot RTC 3 months.

## 2021-09-09 ENCOUNTER — Telehealth: Payer: Medicare HMO

## 2021-09-09 ENCOUNTER — Telehealth: Payer: Self-pay

## 2021-09-09 NOTE — Addendum Note (Signed)
Addended byDamita Dunnings D on: 09/09/2021 11:49 AM   Modules accepted: Orders

## 2021-09-09 NOTE — Telephone Encounter (Signed)
°  Care Management   Follow Up Note   09/09/2021 Name: [name]. MRN: [id number] DOB: 08-Oct-1960   Referred by: Colon Branch, MD Reason for referral : Chronic Care Management (RNCM follow up)   An unsuccessful telephone outreach was attempted today. The patient was referred to the case management team for assistance with care management and care coordination.  A HIPPA compliant phone message was left for the patient providing contact information and requesting a return call.   Follow Up Plan: The Care Management Team will outreach to patient again over the next 30 days.  Thea Silversmith, RN, MSN, BSN, CCM Care Management Coordinator [facility] [phone]

## 2021-09-10 ENCOUNTER — Ambulatory Visit (HOSPITAL_BASED_OUTPATIENT_CLINIC_OR_DEPARTMENT_OTHER): Payer: Medicare HMO | Admitting: Nurse Practitioner

## 2021-09-11 ENCOUNTER — Telehealth (HOSPITAL_COMMUNITY): Payer: Self-pay

## 2021-09-11 NOTE — Telephone Encounter (Signed)
2nd attempt to contact patient to schedule OP MBS - left voicemail. ?

## 2021-09-15 ENCOUNTER — Encounter: Payer: Self-pay | Admitting: Cardiovascular Disease

## 2021-09-15 ENCOUNTER — Other Ambulatory Visit: Payer: Self-pay

## 2021-09-15 ENCOUNTER — Ambulatory Visit: Payer: Medicare HMO | Admitting: Cardiovascular Disease

## 2021-09-15 VITALS — BP 116/78 | HR 52 | Ht 69.0 in | Wt 229.2 lb

## 2021-09-15 DIAGNOSIS — I10 Essential (primary) hypertension: Secondary | ICD-10-CM | POA: Diagnosis not present

## 2021-09-15 DIAGNOSIS — Z720 Tobacco use: Secondary | ICD-10-CM | POA: Diagnosis not present

## 2021-09-15 DIAGNOSIS — R001 Bradycardia, unspecified: Secondary | ICD-10-CM

## 2021-09-15 DIAGNOSIS — I5032 Chronic diastolic (congestive) heart failure: Secondary | ICD-10-CM | POA: Diagnosis not present

## 2021-09-15 DIAGNOSIS — I25118 Atherosclerotic heart disease of native coronary artery with other forms of angina pectoris: Secondary | ICD-10-CM | POA: Diagnosis not present

## 2021-09-15 DIAGNOSIS — E782 Mixed hyperlipidemia: Secondary | ICD-10-CM | POA: Diagnosis not present

## 2021-09-15 NOTE — Progress Notes (Signed)
Chief Complaint  Patient presents with   Follow-up    CAD    History of Present Illness:  61 yo male with history of HTN, hyperlipidemia, tobacco abuse, former cocaine abuse, PAD and CAD here today for cardiac follow up. He presented with an inferolateral STEMI in March 2010 at which time his Circumflex was occluded and was opened with a balloon. He underwent emergent 3V CABG (LIMA to LAD, SVG to Circumflex, SVG to PDA) given his diffuse multivessel disease. In November 2010 he presented with a NSTEMI and had bare metal stenting of the totally occluded saphenous vein graft to the obtuse marginal artery. Cardiac cath October 2014 with 3 patent bypass grafts. Admitted July 2016 to [facility] with CVA. Echo 02/21/15 with normal LV function. Carotid dopplers July 2016 with mild bilateral carotid disease. Admitted to [facility] 07/07/16 with lateral STEMI secondary to acute occlusion of the SVG to the OM. A Synergy DES was placed in the body of the SVG to the OM. Normal LV function by LV gram. He had dyspnea with Brilinta and was discharged on ASA and Plavix. He was referred to DR. Arida for PV workup. Lower ext angiogram 09/23/16 with occluded left SFA which was treated with atherectomy, drug coated balloon angioplasty and stenting. Moderate disease in right SFA. He was seen by Dr. Fletcher Anon in the [facility] clinic in May 2021 and his LE disease was felt to be stable. Cardiac cath 10/01/17 with occlusion of all native vessels with 3 patent bypass grafts. He had an acute ischemic CVA in April 2020. Echo April 2020 with LVEF=60-65%. He refused rehab and signed out AMA. He was seen by telemedicine visit march 2021 and was stable. I saw him in the office in May 2021 and he c/o ongoing dyspnea, leg pain, back pain. He is still smoking. He went into the ED at [facility] 04/12/20 with c/o weakness. He thought he was having a stroke. CT head with no acute abnormalities. BMET and CBC were unremarkable. Troponin was 18 but he did not wish  to go back to the ED as he had a beach trip planned. He had volume overload in May 2022 and was started on Lasix. Echo June 2022 with LVEF=55-60% with grade 3 diastolic dysfunction. RVSP estimated at 53 mmHg. No valve disease. He is now taking Lasix 40 mg daily.   He is here today for follow up. The patient denies any chest pain, dyspnea, palpitations, lower extremity edema, orthopnea, PND, near syncope or syncope. He has some dizziness. Overall doing well. He is still smoking  Primary Care Physician: Colon Branch, MD  Past Medical History:  Diagnosis Date   (HFpEF) heart failure with preserved ejection fraction ([place])    Echo 6/22: EF 55-60, no RWMA, GR 3 DD, elevated LVEDP, normal RVSF, RVSP 53.2, mild to moderate AV sclerosis without stenosis, dilated aortic root (41 mm), ascending aorta 37 mm   Abnormality of gait    Anxiety    CAD (coronary artery disease)    a. 10/2008 Inferolateral STEMI: 3VD w/ occluded LCX (PTCA)-->CABG x 3 (LIMA->LAD, VG->OM, VG->PDA);  b. 06/2009 NSTEMI/PCI: VG->OM 100 (BMS);  c. 05/2013 native 3VD, 3/3 patent grafts. d. 06/2016: Inferior STEMI w/ occluded SVG-OM (DES placed). LIMA-LAD and SVG-PDA patent.   Chronic bronchitis (HCC)    Chronic diastolic CHF (congestive heart failure) ([place])    a. 02/2015 Echo: EF 50-55%, distal septal, apical, inferobasal HK, mild LVH, mild MR, mildly dil LA.   Cocaine use  Colon polyps    a. 09/2015 colonscopy: multiple sessile polyps, path negative for high grade dysplasia.   Depression    Emphysema (subcutaneous) (surgical) resulting from a procedure 10/2008.   Bleb resected at time of 10/2008 CABG. Marketed emphysema noted on surgical report.   Esophagitis    a. 2017 EGD: esophagitis, duodenitis.   ETOH abuse    GERD (gastroesophageal reflux disease)    Hemianopia, homonymous, right    "can't see out the right side of either eye since stroke in 2016/pt description 09/23/2016   Hepatic steatosis    Hiatal hernia    History of  nuclear stress test 08/2017   Nuclear stress test 1/19: EF 35, inf-lat and ant-lat, apical sept/apical scar; no ischemia; Intermediate Risk   Hyperlipidemia    Hypertension    Iron deficiency anemia 2010   Morbid obesity ([place])    Myocardial infarction [facility]) "several"   PAD (peripheral artery disease) ([place])    Pancreatitis 06/2015   Sleep apnea    "don't have a mask" (09/23/2016)   Stroke ([place]) 02/2015   a. 02/2015 Carotid U/S: 1-39% bilat ICA stenosis; "left me blind" (09/23/2016)   Tobacco abuse    still smokes   Tubular adenoma of colon     Past Surgical History:  Procedure Laterality Date   ABDOMINAL AORTOGRAM W/LOWER EXTREMITY N/A 09/23/2016   Procedure: Abdominal Aortogram w/Lower Extremity;  Surgeon: Wellington Hampshire, MD;  Location: [place] CV [facility];  Service: Cardiovascular;  Laterality: N/A;   CARDIAC CATHETERIZATION  11/07/2008   EF of 50-55% -- normal LV systolic function, acute inferolateral ST elevation MI,  PTCA of the circumflex artery -- [name]. [name], [name]   CARDIAC CATHETERIZATION N/A 07/07/2016   Procedure: Left Heart Cath and Cors/Grafts Angiography;  Surgeon: [name], MD;  Location: [place] [facility];  Service: Cardiovascular;  Laterality: N/A;   CARDIAC CATHETERIZATION N/A 07/07/2016   Procedure: Coronary Stent Intervention;  Surgeon: [name], MD;  Location: [place] [facility];  Service: Cardiovascular;  Laterality: N/A;   CORONARY ANGIOPLASTY     CORONARY ARTERY BYPASS GRAFT     "CABG X4"   LEFT HEART CATH AND CORS/GRAFTS ANGIOGRAPHY N/A 10/01/2017   Procedure: LEFT HEART CATH AND CORS/GRAFTS ANGIOGRAPHY;  Surgeon: Burnell Blanks, MD;  Location: [place] [facility];  Service: Cardiovascular;  Laterality: N/A;   LEFT HEART CATHETERIZATION WITH CORONARY/GRAFT ANGIOGRAM N/A 05/15/2013   Procedure: LEFT HEART CATHETERIZATION WITH Beatrix Fetters;  Surgeon: [name], MD;  Location: [facility] C[facility];  Service: Cardiovascular;   Laterality: N/A;   PERIPHERAL VASCULAR INTERVENTION  09/23/2016   Procedure: Peripheral Vascular Intervention;  Surgeon: Wellington Hampshire, MD;  Location: [place] [facility];  Service: Cardiovascular;;  Lt. SFA    Current Outpatient Medications  Medication Sig Dispense Refill   aspirin EC 81 MG EC tablet Take 1 tablet (81 mg total) by mouth daily with breakfast. 30 tablet 2   atorvastatin (LIPITOR) 80 MG tablet TAKE 1 TABLET (80 MG TOTAL) BY MOUTH DAILY AT 6 PM. NEED TO KEEP APPOINTMENT FOR ANY FUTURE REFILLS. 90 tablet 1   augmented betamethasone dipropionate (DIPROLENE-AF) 0.05 % cream APPLY TOPICALLY TWICE DAILY 50 g 1   budesonide-formoterol (SYMBICORT) 160-4.5 MCG/ACT inhaler Inhale 2 puffs into the lungs 2 (two) times daily. 30 g 1   clopidogrel (PLAVIX) 75 MG tablet TAKE 1 TABLET EVERY DAY 90 tablet 1   ezetimibe (ZETIA) 10 MG tablet Take 1 tablet (10  mg total) by mouth daily. 90 tablet 3   fenofibrate (TRICOR) 145 MG tablet TAKE 1 TABLET EVERY DAY 90 tablet 0   folic acid (FOLVITE) 1 MG tablet Take 1 tablet (1 mg total) by mouth daily. 90 tablet 3   furosemide (LASIX) 20 MG tablet Take 2 tablets (40 mg total) by mouth in the morning.     gabapentin (NEURONTIN) 100 MG capsule Take 1 capsule (100 mg total) by mouth 3 (three) times daily. 90 capsule 3   isosorbide mononitrate (IMDUR) 60 MG 24 hr tablet Take 1.5 tablets (90 mg total) by mouth daily. 135 tablet 3   MELATONIN GUMMIES PO Take 5 mg by mouth at bedtime as needed.     nitroGLYCERIN (NITROSTAT) 0.4 MG SL tablet Place 1 tablet (0.4 mg total) under the tongue every 5 (five) minutes x 3 doses as needed for chest pain. 25 tablet 2   pantoprazole (PROTONIX) 40 MG tablet TAKE 1 TABLET TWICE DAILY 180 tablet 2   spironolactone (ALDACTONE) 25 MG tablet Take 0.5 tablets (12.5 mg total) by mouth daily. 45 tablet 3   tamsulosin (FLOMAX) 0.4 MG CAPS capsule Take 0.4 mg by mouth at bedtime.     thiamine 100 MG tablet Take 1 tablet (100 mg  total) by mouth daily. 90 tablet 3   traZODone (DESYREL) 100 MG tablet Take 1 tablet (100 mg total) by mouth at bedtime. 180 tablet 1   valsartan (DIOVAN) 80 MG tablet Take 1 tablet (80 mg total) by mouth daily. 90 tablet 3   No current facility-administered medications for this visit.    Allergies  Allergen Reactions   Bupropion Nausea Only    Social History   Socioeconomic History   Marital status: Single    Spouse name: Not on file   Number of children: 2   Years of education: Not on file   Highest education level: Not on file  Occupational History   Occupation: not working , used to be a Chief Executive Officer    Occupation: disable  Tobacco Use   Smoking status: Every Day    Packs/day: 0.50    Years: 45.00    Pack years: 22.50    Types: Cigarettes    Start date: 1976   Smokeless tobacco: Never   Tobacco comments:    1 pack daily-03/10/21  Vaping Use   Vaping Use: Never used  Substance and Sexual Activity   Alcohol use: Yes    Alcohol/week: 14.0 standard drinks    Types: 14 Cans of beer per week    Comment: as off 12/2017: beer 12-pack q week   Drug use: No    Comment: 09/23/2016 "none in years"   Sexual activity: Not Currently  Other Topics Concern   Not on file  Social History Narrative   Lives by himself   "Sister Alyse Low,  lives in [place], she is willing to help her brother, she will be checking MyCchart messages and coordinating his appointments.  She can be reached at [phone]".   Social Determinants of Health   Financial Resource Strain: Medium Risk   Difficulty of Paying Living Expenses: Somewhat hard  Food Insecurity: Food Insecurity Present   Worried About Charity fundraiser in the Last Year: Sometimes true   Ran Out of Food in the Last Year: Sometimes true  Transportation Needs: Unmet Transportation Needs   Lack of Transportation (Medical): Yes   Lack of Transportation (Non-Medical): Yes  Physical Activity: Not on file  Stress:  Not on file   Social Connections: Not on file  Intimate Partner Violence: Not on file    Family History  Problem Relation Age of Onset   Heart disease Mother    Heart failure Mother    Diabetes Mother    Colon cancer Father        late 76s   Prostate cancer Neg Hx     Review of Systems:  As stated in the HPI and otherwise negative.   BP 116/78    Pulse (!) 52    Ht 5\' 9"  (1.753 m)    Wt 229 lb 3.2 oz (104 kg)    SpO2 94%    BMI 33.85 kg/m   Physical Examination:  General: Well developed, well nourished, NAD  HEENT: OP clear, mucus membranes moist  SKIN: warm, dry. No rashes. Neuro: No focal deficits  Musculoskeletal: Muscle strength 5/5 all ext  Psychiatric: Mood and affect normal  Neck: No JVD, no carotid bruits, no thyromegaly, no lymphadenopathy.  Lungs:Clear bilaterally, no wheezes, rhonci, crackles Cardiovascular: Regular rate and rhythm. No murmurs, gallops or rubs. Abdomen:Soft. Bowel sounds present. Non-tender.  Extremities: No lower extremity edema. Pulses are 2 + in the bilateral DP/PT.  Echo June 2022:   1. Left ventricular ejection fraction, by estimation, is 55 to 60%. The  left ventricle has normal function. The left ventricle has no regional  wall motion abnormalities. The left ventricular internal cavity size was  mildly dilated. Left ventricular  diastolic parameters are consistent with Grade III diastolic dysfunction  (restrictive). Elevated left ventricular end-diastolic pressure.   2. Right ventricular systolic function is normal. The right ventricular  size is normal. There is moderately elevated pulmonary artery systolic  pressure. The estimated right ventricular systolic pressure is 54.0 mmHg.   3. The mitral valve is normal in structure. No evidence of mitral valve  regurgitation. No evidence of mitral stenosis.   4. The aortic valve is tricuspid. Aortic valve regurgitation is not  visualized. Mild to moderate aortic valve sclerosis/calcification is   present, without any evidence of aortic stenosis.   5. Aortic dilatation noted. There is mild dilatation of the aortic root,  measuring 41 mm. There is borderline dilatation of the ascending aorta,  measuring 37 mm.   6. The inferior vena cava is dilated in size with >50% respiratory  variability, suggesting right atrial pressure of 8 mmHg.   EKG:  EKG is ordered today. The ekg ordered today demonstrates sinus bradycardia, rate 52 bpm. RBBB  Recent Labs: 12/16/2020: NT-Pro BNP 1,146 04/11/2021: ALT 15 05/07/2021: TSH 1.08 09/05/2021: BUN 27; Creat 1.61; Hemoglobin 16.2; Platelets 209; Potassium 4.3; Sodium 140   Lipid Panel    Component Value Date/Time   CHOL 141 05/07/2021 1347   CHOL 171 11/14/2019 1441   TRIG 142.0 05/07/2021 1347   HDL 41.70 05/07/2021 1347   HDL 37 (L) 11/14/2019 1441   CHOLHDL 3 05/07/2021 1347   VLDL 28.4 05/07/2021 1347   LDLCALC 71 05/07/2021 1347   LDLCALC 116 (H) 11/14/2019 1441   LDLDIRECT 116.0 05/12/2013 1517     Wt Readings from Last 3 Encounters:  09/15/21 229 lb 3.2 oz (104 kg)  09/05/21 224 lb 4 oz (101.7 kg)  08/05/21 228 lb (103.4 kg)     Other studies Reviewed: Additional studies/ records that were reviewed today include: . Review of the above records demonstrates:    Assessment and Plan:   1. CAD without angina: He has no chest pain. He  is s/p 3V CABG in March 2010 and has had bare metal stent to SVG to Circumflex in November 2010 as well as DES placed in the body of the SVG to OM in November 2017 in the setting of lateral STEMI. LV function is normal by LV gram 2022. Continue ASA, Plavix, statin. Will stop Lopressor today given bradycardia and dizziness.   2. Chronic diastolic CHF: Volume status ok today. Weight is stable. Continue Lasix.    3. HTN: BP is controlled. No changes  4. PAD: Followed in [place] clinic by Dr. Fletcher Anon.   5. Hyperlipidemia: LDL at goal in September 2022. Continue statin  6. Tobacco abuse: Smoking cessation is  encouraged  7. Sinus bradycardia/dizziness: Will stop Lopressor  Current medicines are reviewed at length with the patient today.  The patient does not have concerns regarding medicines.  The following changes have been made:  no change  Labs/ tests ordered today include:   Orders Placed This Encounter  Procedures   EKG 12-Lead   Disposition:   F/U with me in 12  months  Signed, [name], MD 09/15/2021 2:51 PM    [facility] [address], [place], [place]  [address] Phone: [phone]; Fax: [phone]

## 2021-09-15 NOTE — Patient Instructions (Signed)
Medication Instructions:  DISCONTINUE Lopressor  *If you need a refill on your cardiac medications before your next appointment, please call your pharmacy*   Lab Work: None Ordered  If you have labs (blood work) drawn today and your tests are completely normal, you will receive your results only by: [place] (if you have MyChart) OR A paper copy in the mail If you have any lab test that is abnormal or we need to change your treatment, we will call you to review the results.   Testing/Procedures: None Ordered   Follow-Up: At [facility], you and your health needs are our priority.  As part of our continuing mission to provide you with exceptional heart care, we have created designated Provider Care Teams.  These Care Teams include your primary Cardiologist (physician) and Advanced Practice Providers (APPs -  Physician Assistants and Nurse Practitioners) who all work together to provide you with the care you need, when you need it.  We recommend signing up for the patient portal called "MyChart".  Sign up information is provided on this After Visit Summary.  MyChart is used to connect with patients for Virtual Visits (Telemedicine).  Patients are able to view lab/test results, encounter notes, upcoming appointments, etc.  Non-urgent messages can be sent to your provider as well.   To learn more about what you can do with MyChart, go to [email or web address].    Your next appointment:   12 month(s)  The format for your next appointment:   In Person  Provider:   [name], MD    Other Instructions

## 2021-09-16 ENCOUNTER — Ambulatory Visit (INDEPENDENT_AMBULATORY_CARE_PROVIDER_SITE_OTHER): Payer: Medicare HMO

## 2021-09-16 DIAGNOSIS — K219 Gastro-esophageal reflux disease without esophagitis: Secondary | ICD-10-CM

## 2021-09-16 DIAGNOSIS — I10 Essential (primary) hypertension: Secondary | ICD-10-CM

## 2021-09-16 DIAGNOSIS — I5032 Chronic diastolic (congestive) heart failure: Secondary | ICD-10-CM

## 2021-09-16 NOTE — Patient Instructions (Signed)
Visit Information  Thank you for taking time to visit with me today. Please don't hesitate to contact me if I can be of assistance to you before our next scheduled telephone appointment.  Following are the goals we discussed today:  Patient Goals/Self-Care Activities: Check your voice messages to obtain contact number for speech therapist and call to schedule appointment. If you are unable to retrieve the number, contact your primary care provider office for assistance. follow rescue plan if symptoms flare-up know when to call the doctor weight gain of 3 pounds overnight, increased swelling in stomach, feet/legs or hands. Increased shortness of breath, having less energy than normal, new or worsening dizziness, if it is harder for you to breathe when lying down and you need to sit up to breathe or you feel that something is not right.  Make an appointment with your eye doctor Continue to monitor for signs/symptoms of COPD worsening: increase cough, shortness of breath or mucus production change for 2 days or more, your number 1 priority is to call your doctor. eat more whole grains, fruits and vegetables, lean meats and healthy fats Continue to attend provider visits as recommended  Our next appointment is by telephone on 10/14/21 at 2:00 pm  Please call the care guide team at [phone] if you need to cancel or reschedule your appointment.   If you are experiencing a Mental Health or [place] or need someone to talk to, please call the Suicide and Crisis Lifeline: 988 call 1-800-273-TALK (toll free, 24 hour hotline)   Patient verbalizes understanding of instructions and care plan provided today and agrees to view in [place]. Active MyChart status confirmed with patient.    Thea Silversmith, RN, MSN, BSN, CCM Care Management Coordinator [facility] [phone]

## 2021-09-16 NOTE — Chronic Care Management (AMB) (Addendum)
Chronic Care Management   CCM RN Visit Note  09/16/2021 Name: [name]. MRN: [id number] DOB: [birth year]  Subjective: [name]. is a 61 y.o. year old male who is a primary care patient of Kenneth Branch, MD. The care management team was consulted for assistance with disease management and care coordination needs.    Engaged with patient by telephone for follow up visit in response to provider referral for case management and/or care coordination services.   Consent to Services:  The patient was given information about Chronic Care Management services, agreed to services, and gave verbal consent prior to initiation of services.  Please see initial visit note for detailed documentation.   Patient agreed to services and verbal consent obtained.   Assessment: Review of patient past medical history, allergies, medications, health status, including review of consultants reports, laboratory and other test data, was performed as part of comprehensive evaluation and provision of chronic care management services.   SDOH (Social Determinants of Health) assessments and interventions performed:    CCM Care Plan  Allergies  Allergen Reactions   Bupropion Nausea Only    Outpatient Encounter Medications as of 09/16/2021  Medication Sig Note   aspirin EC 81 MG EC tablet Take 1 tablet (81 mg total) by mouth daily with breakfast.    atorvastatin (LIPITOR) 80 MG tablet TAKE 1 TABLET (80 MG TOTAL) BY MOUTH DAILY AT 6 PM. NEED TO KEEP APPOINTMENT FOR ANY FUTURE REFILLS.    budesonide-formoterol (SYMBICORT) 160-4.5 MCG/ACT inhaler Inhale 2 puffs into the lungs 2 (two) times daily.    clopidogrel (PLAVIX) 75 MG tablet TAKE 1 TABLET EVERY DAY    ezetimibe (ZETIA) 10 MG tablet Take 1 tablet (10 mg total) by mouth daily.    fenofibrate (TRICOR) 145 MG tablet TAKE 1 TABLET EVERY DAY    folic acid (FOLVITE) 1 MG tablet Take 1 tablet (1 mg total) by mouth daily.    furosemide (LASIX) 20 MG  tablet Take 2 tablets (40 mg total) by mouth in the morning.    gabapentin (NEURONTIN) 100 MG capsule Take 1 capsule (100 mg total) by mouth 3 (three) times daily.    isosorbide mononitrate (IMDUR) 60 MG 24 hr tablet Take 1.5 tablets (90 mg total) by mouth daily.    MELATONIN GUMMIES PO Take 5 mg by mouth at bedtime as needed.    nitroGLYCERIN (NITROSTAT) 0.4 MG SL tablet Place 1 tablet (0.4 mg total) under the tongue every 5 (five) minutes x 3 doses as needed for chest pain. 05/07/2021: PRN   pantoprazole (PROTONIX) 40 MG tablet TAKE 1 TABLET TWICE DAILY    spironolactone (ALDACTONE) 25 MG tablet Take 0.5 tablets (12.5 mg total) by mouth daily.    tamsulosin (FLOMAX) 0.4 MG CAPS capsule Take 0.4 mg by mouth at bedtime.    thiamine 100 MG tablet Take 1 tablet (100 mg total) by mouth daily.    traZODone (DESYREL) 100 MG tablet Take 1 tablet (100 mg total) by mouth at bedtime.    valsartan (DIOVAN) 80 MG tablet Take 1 tablet (80 mg total) by mouth daily.    augmented betamethasone dipropionate (DIPROLENE-AF) 0.05 % cream APPLY TOPICALLY TWICE DAILY    No facility-administered encounter medications on file as of 09/16/2021.    Patient Active Problem List   Diagnosis Date Noted   COPD 0 AB 03/31/2021   (HFpEF) heart failure with preserved ejection fraction (HCC)    DOE (dyspnea on exertion) 11/07/2020  Diabetes ([place]) 03/18/2020   Prolapsed lumbar disc 01/25/2020   Closed fracture of lateral malleolus 01/25/2020   Hiatal hernia    Hepatic steatosis    Hemianopia, homonymous, right    ETOH abuse    Esophagitis    Chronic diastolic CHF (congestive heart failure) ([place])    Chronic bronchitis ([place])    CAD (coronary artery disease)    PAD (peripheral artery disease) ([place]) 09/23/2016   Morbid obesity ([place])    Chronic pain syndrome 10/23/2015   GERD (gastroesophageal reflux disease) 08/07/2015   Pancreatitis, acute    PCP NOTES >>>>> 06/01/2015   Annual physical exam 05/31/2015   Acute  ischemic stroke ([place]) 02/20/2015   Hx of cocaine abuse ([place]) 02/20/2015   BPH (benign prostatic hyperplasia) 02/20/2015   OSA (obstructive sleep apnea) ?? 05/05/2013   Noncompliance 05/05/2013   Anxiety and depression 05/05/2013   Erectile dysfunction 03/17/2012   Emphysema (subcutaneous) (surgical) resulting from a procedure 10/08/2008   Iron deficiency anemia 08/10/2008   ABNORMALITY OF GAIT 08/06/2008   Cigarette smoker 07/27/2008   Dyslipidemia 07/26/2008   Essential hypertension 07/26/2008   ST elevation myocardial infarction involving left circumflex coronary artery ([place]) 07/26/2008    Conditions to be addressed/monitored:CHF, CAD, HTN, COPD, DMII, and GERD  Care Plan : RN Care Management Plan of Care  Updates made by Kenneth Rued, RN since 09/16/2021 12:00 AM     Problem: Chronic Disease Managment Education and/or Care Coordination needs   Priority: High     Long-Range Goal: Development of plan of care for Chronic Disease Management and /or Care Coordination Needs   Start Date: 07/01/2021  Expected End Date: 02/13/2022  Priority: High  Note:   Current Barriers: [name] reports still with some dizziness and completed visit with cardiologist on yesterday. He states, "it went pretty well". Had after visit summary with him and was able to discuss instructions on after visit summary, recalling weight 229 lbs and blood pressure 116/78. He reports his is filling pill box without any difficulty and has stopped taking Metoprolol as instructed by cardiologist. [name] reports awaiting speech therapy appointment. Upon review of the chart, RNCM noted that he has been contact x2 unsuccessfully by speech to schedule an appointment. RNCM unable to see a contact number for therapist. RNCM encouraged [name] to listen to his messages and return the call the speech. He states he is going to listen to his messages. He reports he is eating better and staying away from sweets, but states,  he likes breads. HGB A1C improved 5.9 on 09/05/21 from 6.7 on 05/07/21. Knowledge Deficits related to plan of care for management of CHF, CAD, HTN, COPD, DMII, and GERD  Care Coordination needs related to health care management of chronic conditions  Chronic Disease Management support and education needs related to CHF, CAD, HTN, COPD, DMII, and GERD  RNCM Clinical Goal(s):  Patient will verbalize basic understanding of CHF, CAD, HTN, COPD, DMII, and GERD disease process and self health management plan as evidenced by self report of taking and chart review of taking medications as prescribed, attending provider visits as scheduled demonstrate improved adherence to prescribed treatment plan for CHF, CAD, HTN, COPD, DMII, and GERD as evidenced by self report and/or chart notation of taking medications as precribed, calling provider with questions concerns, attending provider visits. work with pharmacist to address Medication procurement related to CHF, CAD, HTN, COPD, DMII, and GERD as evidenced by review of EMR and patient or pharmacist report  work with [employer] care guide to address needs related to Transportation and Limited access to food as evidenced by patient and/or community resource care guide support    through collaboration with Consulting civil engineer, provider, and care team. Transportation resources discussed with patient by care guide.   Interventions: 1:1 collaboration with primary care provider regarding development and update of comprehensive plan of care as evidenced by provider attestation and co-signature Inter-disciplinary care team collaboration (see longitudinal plan of care) Evaluation of current treatment plan related to  self management and patient's adherence to plan as established by provider Collaboration and Care Coordinating with embedded clinical pharmacist, Cherre Robins, Pharm D.  GERD Interventions: Goal on track:  NO. Long Term Goal Evaluation of current  treatment plan related to GERD, self-management and patient's adherence to plan as established by provider. Advised patient to continue to take medications as prescribed Reviewed chart-encouraged patient to listen to his voice messages to obtain information needed to schedule speech therapy appointment. Instructed to follow up with PCP office if unable to retrieve contact number  Hypertension Interventions: Goal on Track: Yes. Long Term Goal Last practice recorded BP readings:  BP Readings from Last 3 Encounters:  09/15/21 116/78  09/05/21 122/68  08/05/21 126/80  Most recent eGFR/CrCl:  Lab Results  Component Value Date   EGFR 85 02/19/2021    No components found for: CRCL  Evaluation of current treatment plan related to hypertension self management and patient's adherence to plan as established by provider; Reviewed medications with patient and discussed importance of compliance; Discussed plans with patient for ongoing care management follow up and provided patient with direct contact information for care management team; Reviewed scheduled/upcoming provider appointments including:   Diabetes Interventions: Goal on Track: yes. Long Term Goal Assessed patient's understanding of A1c goal: <7% Counseled on importance of regular laboratory monitoring as prescribed Provided positive feedback regarding self health management Lab Results  Component Value Date   HGBA1C 5.9 (H) 09/05/2021  HGBA1C improved-Hgb A1C 6.7 on 05/07/21  COPD Interventions: Goal on Track: Yes. Long Term Goal Medications reviewed Encouraged Patient to contact provider for follow up appointment. Last visit with pulmonologist 03/31/21. Per note: follow up with pulmonologist as needed. RNCM encouraged Mr. Meroney to follow up at least once a year for reassessment of: condition and medication needs.  Heart Failure Interventions: Goal on Track: Yes. Long Term Goal Discussed the importance of keeping all appointments with  provider Encouraged to continue to attend provider visits as scheduled Encouraged to continue to take medications as prescribed Encouraged to follow up with provider with any new or worsening concerns Provided positive feedback regarding self health management  CAD Interventions: Goal on Track: Yes. Long Term Goal Medications reviewed  Reviewed Importance of taking all medications as prescribed Reviewed Importance of attending all scheduled provider appointments   Patient Goals/Self-Care Activities: Check your voice messages to obtain contact number for speech therapist and call to schedule appointment. If you are unable to retrieve the number, contact your primary care provider office for assistance. follow rescue plan if symptoms flare-up know when to call the doctor weight gain of 3 pounds overnight, increased swelling in stomach, feet/legs or hands. Increased shortness of breath, having less energy than normal, new or worsening dizziness, if it is harder for you to breathe when lying down and you need to sit up to breathe or you feel that something is not right.  Make an appointment with your eye doctor Continue to monitor for signs/symptoms  of COPD worsening: increase cough, shortness of breath or mucus production change for 2 days or more, your number 1 priority is to call your doctor. eat more whole grains, fruits and vegetables, lean meats and healthy fats Continue to attend provider visits as recommended   Plan:Telephone follow up appointment with care management team member scheduled for:  10/14/21 The patient has been provided with contact information for the care management team and has been advised to call with any health related questions or concerns.   Thea Silversmith, RN, MSN, BSN, CCM Care Management Coordinator [facility] [phone]   I have reviewed and agree with [employer] documentation.  [name], MD

## 2021-09-22 ENCOUNTER — Other Ambulatory Visit (HOSPITAL_COMMUNITY): Payer: Self-pay

## 2021-09-22 DIAGNOSIS — R1310 Dysphagia, unspecified: Secondary | ICD-10-CM

## 2021-10-02 ENCOUNTER — Ambulatory Visit (HOSPITAL_COMMUNITY)
Admission: RE | Admit: 2021-10-02 | Discharge: 2021-10-02 | Disposition: A | Discharge status: Home / Self Care | Payer: Medicare HMO | Source: Ambulatory Visit | Attending: Internal Medicine | Admitting: Internal Medicine

## 2021-10-02 ENCOUNTER — Other Ambulatory Visit: Payer: Self-pay

## 2021-10-02 DIAGNOSIS — J449 Chronic obstructive pulmonary disease, unspecified: Secondary | ICD-10-CM

## 2021-10-02 DIAGNOSIS — R1310 Dysphagia, unspecified: Secondary | ICD-10-CM

## 2021-10-02 DIAGNOSIS — R490 Dysphonia: Secondary | ICD-10-CM | POA: Insufficient documentation

## 2021-10-06 ENCOUNTER — Encounter: Payer: Self-pay | Admitting: Internal Medicine

## 2021-10-07 DIAGNOSIS — I5032 Chronic diastolic (congestive) heart failure: Secondary | ICD-10-CM

## 2021-10-07 DIAGNOSIS — I10 Essential (primary) hypertension: Secondary | ICD-10-CM | POA: Diagnosis not present

## 2021-10-13 ENCOUNTER — Telehealth: Payer: Self-pay | Admitting: *Deleted

## 2021-10-13 NOTE — Chronic Care Management (AMB) (Signed)
?  Care Management  ? ?Note ? ?10/13/2021 ?Name: [name]. MRN: [id number] DOB: 03/06/1961 ? ?[name]. is a 61 y.o. year old male who is a primary care patient of Colon Branch, MD and is actively engaged with the care management team. I reached out to [employer]. by phone today to assist with re-scheduling a follow up visit with the Pharmacist ? ?Follow up plan: ?Unsuccessful telephone outreach attempt made. A HIPAA compliant phone message was left for the patient providing contact information and requesting a return call.  ? ?Monserrath Junio, CCMA ?Care Guide, Embedded Care Coordination ?[place]  Care Management  ?Direct Dial: [phone] ? ? ?

## 2021-10-14 ENCOUNTER — Ambulatory Visit (INDEPENDENT_AMBULATORY_CARE_PROVIDER_SITE_OTHER): Payer: Medicare HMO

## 2021-10-14 DIAGNOSIS — I10 Essential (primary) hypertension: Secondary | ICD-10-CM

## 2021-10-14 DIAGNOSIS — J449 Chronic obstructive pulmonary disease, unspecified: Secondary | ICD-10-CM

## 2021-10-14 DIAGNOSIS — K219 Gastro-esophageal reflux disease without esophagitis: Secondary | ICD-10-CM

## 2021-10-14 DIAGNOSIS — I5032 Chronic diastolic (congestive) heart failure: Secondary | ICD-10-CM

## 2021-10-14 NOTE — Chronic Care Management (AMB) (Addendum)
Chronic Care Management   CCM RN Visit Note  10/14/2021 Name: [name]. MRN: [id number] DOB: 1960/09/08  Subjective: [name]. is a 61 y.o. year old male who is a primary care patient of Kenneth Branch, MD. The care management team was consulted for assistance with disease management and care coordination needs.    Engaged with patient by telephone for follow up visit in response to provider referral for case management and/or care coordination services.   Consent to Services:  The patient was given information about Chronic Care Management services, agreed to services, and gave verbal consent prior to initiation of services.  Please see initial visit note for detailed documentation.   Patient agreed to services and verbal consent obtained.   Assessment: Review of patient past medical history, allergies, medications, health status, including review of consultants reports, laboratory and other test data, was performed as part of comprehensive evaluation and provision of chronic care management services.   SDOH (Social Determinants of Health) assessments and interventions performed:    CCM Care Plan  Allergies  Allergen Reactions   Bupropion Nausea Only    Outpatient Encounter Medications as of 10/14/2021  Medication Sig Note   aspirin EC 81 MG EC tablet Take 1 tablet (81 mg total) by mouth daily with breakfast.    atorvastatin (LIPITOR) 80 MG tablet TAKE 1 TABLET (80 MG TOTAL) BY MOUTH DAILY AT 6 PM. NEED TO KEEP APPOINTMENT FOR ANY FUTURE REFILLS.    augmented betamethasone dipropionate (DIPROLENE-AF) 0.05 % cream APPLY TOPICALLY TWICE DAILY    budesonide-formoterol (SYMBICORT) 160-4.5 MCG/ACT inhaler Inhale 2 puffs into the lungs 2 (two) times daily.    clopidogrel (PLAVIX) 75 MG tablet TAKE 1 TABLET EVERY DAY    ezetimibe (ZETIA) 10 MG tablet Take 1 tablet (10 mg total) by mouth daily.    fenofibrate (TRICOR) 145 MG tablet TAKE 1 TABLET EVERY DAY    folic acid  (FOLVITE) 1 MG tablet Take 1 tablet (1 mg total) by mouth daily.    furosemide (LASIX) 20 MG tablet Take 2 tablets (40 mg total) by mouth in the morning.    gabapentin (NEURONTIN) 100 MG capsule Take 1 capsule (100 mg total) by mouth 3 (three) times daily.    isosorbide mononitrate (IMDUR) 60 MG 24 hr tablet Take 1.5 tablets (90 mg total) by mouth daily.    nitroGLYCERIN (NITROSTAT) 0.4 MG SL tablet Place 1 tablet (0.4 mg total) under the tongue every 5 (five) minutes x 3 doses as needed for chest pain. 05/07/2021: PRN   pantoprazole (PROTONIX) 40 MG tablet TAKE 1 TABLET TWICE DAILY    spironolactone (ALDACTONE) 25 MG tablet Take 0.5 tablets (12.5 mg total) by mouth daily.    tamsulosin (FLOMAX) 0.4 MG CAPS capsule Take 0.4 mg by mouth at bedtime. 10/14/2021: Reports increased to 0.4 mg twice a day per Dr. Milford Cage   thiamine 100 MG tablet Take 1 tablet (100 mg total) by mouth daily.    traZODone (DESYREL) 100 MG tablet Take 1 tablet (100 mg total) by mouth at bedtime.    valsartan (DIOVAN) 80 MG tablet Take 1 tablet (80 mg total) by mouth daily.    MELATONIN GUMMIES PO Take 5 mg by mouth at bedtime as needed. (Patient not taking: Reported on 10/14/2021)    No facility-administered encounter medications on file as of 10/14/2021.    Patient Active Problem List   Diagnosis Date Noted   COPD 0 AB 03/31/2021   (HFpEF)  heart failure with preserved ejection fraction (HCC)    DOE (dyspnea on exertion) 11/07/2020   Diabetes ([place]) 03/18/2020   Prolapsed lumbar disc 01/25/2020   Closed fracture of lateral malleolus 01/25/2020   Hiatal hernia    Hepatic steatosis    Hemianopia, homonymous, right    ETOH abuse    Esophagitis    Chronic diastolic CHF (congestive heart failure) (HCC)    Chronic bronchitis ([place])    CAD (coronary artery disease)    PAD (peripheral artery disease) ([place]) 09/23/2016   Morbid obesity ([place])    Chronic pain syndrome 10/23/2015   GERD (gastroesophageal reflux disease) 08/07/2015    Pancreatitis, acute    PCP NOTES >>>>> 06/01/2015   Annual physical exam 05/31/2015   Acute ischemic stroke ([place]) 02/20/2015   Hx of cocaine abuse ([place]) 02/20/2015   BPH (benign prostatic hyperplasia) 02/20/2015   OSA (obstructive sleep apnea) ?? 05/05/2013   Noncompliance 05/05/2013   Anxiety and depression 05/05/2013   Erectile dysfunction 03/17/2012   Emphysema (subcutaneous) (surgical) resulting from a procedure 10/08/2008   Iron deficiency anemia 08/10/2008   ABNORMALITY OF GAIT 08/06/2008   Cigarette smoker 07/27/2008   Dyslipidemia 07/26/2008   Essential hypertension 07/26/2008   ST elevation myocardial infarction involving left circumflex coronary artery ([place]) 07/26/2008    Conditions to be addressed/monitored:CHF, CAD, HTN, HLD, and COPD  Care Plan : RN Care Management Plan of Care  Updates made by Luretha Rued, RN since 10/14/2021 12:00 AM     Problem: Chronic Disease Managment Education and/or Care Coordination needs   Priority: High     Long-Range Goal: Development of plan of care for Chronic Disease Management and /or Care Coordination Needs   Start Date: 07/01/2021  Expected End Date: 02/13/2022  Priority: High  Note:   Current Barriers: [name] is without any specific questions or concerns. Reports he would like to have Gabapentin and Tamsulosin switched to mail order with [facility]. He states he continues to eat healthy. Reports he is going to have recommended blood work done next week.  Knowledge Deficits related to plan of care for management of CHF, CAD, HTN, COPD, DMII, and GERD  Care Coordination needs related to health care management of chronic conditions  Chronic Disease Management support and education needs related to CHF, CAD, HTN, COPD, DMII, and GERD  RNCM Clinical Goal(s):  Patient will verbalize basic understanding of CHF, CAD, HTN, COPD, DMII, and GERD disease process and self health management plan as evidenced by self report of taking and  chart review of taking medications as prescribed, attending provider visits as scheduled demonstrate improved adherence to prescribed treatment plan for CHF, CAD, HTN, COPD, DMII, and GERD as evidenced by self report and/or chart notation of taking medications as precribed, calling provider with questions concerns, attending provider visits. work with pharmacist to address Medication procurement related to CHF, CAD, HTN, COPD, DMII, and GERD as evidenced by review of EMR and patient or pharmacist report    work with community resource care guide to address needs related to Transportation and Limited access to food as evidenced by patient and/or community resource care guide support    through collaboration with Consulting civil engineer, provider, and care team. Transportation resources discussed with patient by care guide.   Interventions: 1:1 collaboration with primary care provider regarding development and update of comprehensive plan of care as evidenced by provider attestation and co-signature Inter-disciplinary care team collaboration (see longitudinal plan of care) Evaluation of current treatment plan  related to  self management and patient's adherence to plan as established by provider Collaboration and Care Coordinating with embedded clinical pharmacist, Tammy Eckard-clinical pharmacist notified of request to switch Gabapentin and Tamsulosin to [facility] mail order.  GERD Interventions: Goal on track:  Yes. Long Term Goal Evaluation of current treatment plan related to GERD, self-management and patient's adherence to plan as established by provider. Reviewed medications: patient takes Pantoprazole twice a day Encouraged to attend provider visit as scheduled  Hypertension Interventions: Goal on Track: Yes. Long Term Goal Last practice recorded BP readings:  BP Readings from Last 3 Encounters:  09/15/21 116/78  09/05/21 122/68  08/05/21 126/80  Most recent eGFR/CrCl:  Lab Results  Component Value  Date   EGFR 85 02/19/2021    No components found for: CRCL  Evaluation of current treatment plan related to hypertension self management and patient's adherence to plan as established by provider Reviewed medications with patient and discussed importance of compliance Encouraged to attend provider visit as scheduled  Diabetes Interventions: Goal on Track: yes. Long Term Goal Assessed patient's understanding of A1c goal: <7% Counseled on importance of regular laboratory monitoring as prescribed Lab Results  Component Value Date   HGBA1C 5.9 (H) 09/05/2021  HGBA1C improved-Hgb A1C 6.7 on 05/07/21  COPD Interventions: Goal on Track: Yes. Long Term Goal Medications reviewed, encouraged to take as prescribed. Reinforced purpose of inhaler and encouraged to take daily as prescribed and rinse after use Encouraged to attend provider visit as recommended  Heart Failure Interventions: Goal on Track: Yes. Long Term Goal Discussed the importance of keeping all appointments with provider Encouraged to continue to attend provider visits as scheduled Encouraged to continue to take lasix as prescribed-40 mg daily Encouraged to use urinal if urgency is a concern Encouraged to follow up with provider with any new or worsening concerns Provided positive feedback regarding self health management  CAD Interventions: Goal on Track: Yes. Long Term Goal Medications reviewed with patient Reviewed Importance of taking all medications as prescribed Reviewed Importance of attending all scheduled provider appointments   Patient Goals/Self-Care Activities: follow rescue plan if symptoms flare-up know when to call the doctor weight gain of 3 pounds overnight, increased swelling in stomach, feet/legs or hands. Increased shortness of breath, having less energy than normal, new or worsening dizziness, if it is harder for you to breathe when lying down and you need to sit up to breathe or you feel that something is  not right.  Continue to attend provider visits as recommended Continue to monitor for signs/symptoms of COPD worsening: increase cough, shortness of breath or mucus production change for 2 days or more, your number 1 priority is to call your doctor. eat more whole grains, fruits and vegetables, lean meats and healthy fats   Plan:Telephone follow up appointment with care management team member scheduled for:  11/11/21 The patient has been provided with contact information for the care management team and has been advised to call with any health related questions or concerns.   Thea Silversmith, RN, MSN, BSN, CCM Care Management Coordinator [facility] [phone]    I have reviewed and agree with [employer] documentation.  [name], MD

## 2021-10-14 NOTE — Patient Instructions (Signed)
Visit Information ? ?Thank you for taking time to visit with me today. Please don't hesitate to contact me if I can be of assistance to you before our next scheduled telephone appointment. ? ?Following are the goals we discussed today:  ?Patient Goals/Self-Care Activities: ?follow rescue plan if symptoms flare-up ?know when to call the doctor weight gain of 3 pounds overnight, increased swelling in stomach, feet/legs or hands. Increased shortness of breath, having less energy than normal, new or worsening dizziness, if it is harder for you to breathe when lying down and you need to sit up to breathe or you feel that something is not right.  ?Continue to attend provider visits as recommended ?Continue to monitor for signs/symptoms of COPD worsening: increase cough, shortness of breath or mucus production change for 2 days or more, your number 1 priority is to call your doctor. ?eat more whole grains, fruits and vegetables, lean meats and healthy fats ? ?Our next appointment is by telephone on 11/11/21 at 2:00 pm ? ?Please call the care guide team at [phone] if you need to cancel or reschedule your appointment.  ? ?If you are experiencing a Mental Health or [place] or need someone to talk to, please call the Suicide and Crisis Lifeline: 988 ?call 1-800-273-TALK (toll free, 24 hour hotline)  ? ?Patient verbalizes understanding of instructions and care plan provided today and agrees to view in [place]. Active MyChart status confirmed with patient.   ? ?Thea Silversmith, RN, MSN, BSN, CCM ?Care Management Coordinator ?[facility] ?[phone] ? ?

## 2021-10-15 ENCOUNTER — Telehealth: Payer: Self-pay | Admitting: Pharmacist

## 2021-10-15 ENCOUNTER — Ambulatory Visit: Payer: Medicare HMO | Admitting: Pharmacist

## 2021-10-15 DIAGNOSIS — I10 Essential (primary) hypertension: Secondary | ICD-10-CM

## 2021-10-15 DIAGNOSIS — E1151 Type 2 diabetes mellitus with diabetic peripheral angiopathy without gangrene: Secondary | ICD-10-CM

## 2021-10-15 DIAGNOSIS — N400 Enlarged prostate without lower urinary tract symptoms: Secondary | ICD-10-CM

## 2021-10-15 DIAGNOSIS — I5032 Chronic diastolic (congestive) heart failure: Secondary | ICD-10-CM

## 2021-10-15 NOTE — Chronic Care Management (AMB) (Signed)
//   Chronic Care Management Pharmacy Note  10/15/2021 Name:  Digby Groeneveld. MRN:  [id number] DOB:  05-Mar-1961  Summary:  Received request from [facility] nurse manager to assist patient in getting tamsulosin and gabapentin moved to [facility].  After approval from PCP, sent in prescriptions for gabapentin and Tamsulosin for 90 days + 1 RF to [facility]  Subjective: [name] is an 61 y.o. year old male who is a primary patient of [name], [name], MD.  The CCM team was consulted for assistance with disease management and care coordination needs.    Collaboration with patient and Marcus  for follow up visit in response to provider referral for pharmacy case management and/or care coordination services.   Consent to Services:  The patient was given information about Chronic Care Management services, agreed to services, and gave verbal consent prior to initiation of services.  Please see initial visit note for detailed documentation.   Patient Care Team: Colon Branch, MD as PCP - General (Internal Medicine) Burnell Blanks, MD as PCP - Cardiology (Cardiology) Ralene Bathe, MD as Consulting Physician (Ophthalmology) Hilarie Fredrickson, Lajuan Lines, MD as Consulting Physician (Gastroenterology) Susa Day, MD as Consulting Physician (Orthopedic Surgery) Luretha Rued, RN as Case Manager Cherre Robins, RPH-CPP (Pharmacist) Rozetta Nunnery, MD (Inactive) as Consulting Physician (Otolaryngology)   Objective:  Lab Results  Component Value Date   CREATININE 1.61 (H) 09/05/2021   CREATININE 1.02 04/11/2021   CREATININE 1.02 02/19/2021    Lab Results  Component Value Date   HGBA1C 5.9 (H) 09/05/2021   Last diabetic Eye exam:  Lab Results  Component Value Date/Time   HMDIABEYEEXA No Retinopathy 06/30/2021 12:00 AM    Last diabetic Foot exam: No results found for: HMDIABFOOTEX      Component Value  Date/Time   CHOL 141 05/07/2021 1347   CHOL 171 11/14/2019 1441   TRIG 142.0 05/07/2021 1347   HDL 41.70 05/07/2021 1347   HDL 37 (L) 11/14/2019 1441   CHOLHDL 3 05/07/2021 1347   VLDL 28.4 05/07/2021 1347   LDLCALC 71 05/07/2021 1347   LDLCALC 116 (H) 11/14/2019 1441   LDLDIRECT 116.0 05/12/2013 1517    Hepatic Function Latest Ref Rng & Units 04/11/2021 07/10/2020 11/26/2018  Total Protein 6.5 - 8.1 g/dL 7.9 7.1 7.8  Albumin 3.5 - 5.0 g/dL 4.3 4.0 3.9  AST 15 - 41 U/L '18 12 22  ' ALT 0 - 44 U/L '15 13 19  ' Alk Phosphatase 38 - 126 U/L 66 135(H) 88  Total Bilirubin 0.3 - 1.2 mg/dL 1.5(H) 1.2 2.1(H)  Bilirubin, Direct 0.0 - 0.3 mg/dL - - -    Lab Results  Component Value Date/Time   TSH 1.895 07/07/2016 06:31 PM   TSH 1.88 05/05/2013 12:14 PM   TSH 2.290 **Test methodology is 3rd generation TSH** 06/23/2009 07:55 AM    CBC Latest Ref Rng & Units 09/05/2021 04/11/2021 12/27/2019  WBC 3.8 - 10.8 Thousand/uL 9.1 11.0(H) 7.4  Hemoglobin 13.2 - 17.1 g/dL 16.2 15.2 15.5  Hematocrit 38.5 - 50.0 % 47.8 45.9 47.6  Platelets 140 - 400 Thousand/uL 209 211 221.0    Lab Results  Component Value Date/Time   VD25OH 10.8 (L) 11/03/2016 02:53 PM    Clinical ASCVD: Yes  The ASCVD Risk score (Arnett DK, et al., 2019) failed to calculate for the following reasons:   The patient has a prior MI or stroke diagnosis  Social History   Tobacco Use  Smoking Status Every Day   Packs/day: 0.50   Years: 45.00   Pack years: 22.50   Types: Cigarettes   Start date: 1976  Smokeless Tobacco Never  Tobacco Comments   1 pack daily-03/10/21   BP Readings from Last 3 Encounters:  09/15/21 116/78  09/05/21 122/68  08/05/21 126/80   Pulse Readings from Last 3 Encounters:  09/15/21 (!) 52  09/05/21 (!) 56  08/05/21 62   Wt Readings from Last 3 Encounters:  09/15/21 229 lb 3.2 oz (104 kg)  09/05/21 224 lb 4 oz (101.7 kg)  08/05/21 228 lb (103.4 kg)    Assessment: Review of patient past medical  history, allergies, medications, health status, including review of consultants reports, laboratory and other test data, was performed as part of comprehensive evaluation and provision of chronic care management services.   SDOH:  (Social Determinants of Health) assessments and interventions performed:       CCM Care Plan  Allergies  Allergen Reactions   Bupropion Nausea Only    Medications Reviewed Today     Reviewed by Luretha Rued, RN (Registered Nurse) on 10/14/21 at 42  Med List Status: <None>   Medication Order Taking? Sig Documenting Provider Last Dose Status Informant  aspirin EC 81 MG EC tablet [id number] Yes Take 1 tablet (81 mg total) by mouth daily with breakfast. Roxan Hockey, MD Taking Active   atorvastatin (LIPITOR) 80 MG tablet [id number] Yes TAKE 1 TABLET (80 MG TOTAL) BY MOUTH DAILY AT 6 PM. NEED TO KEEP APPOINTMENT FOR ANY FUTURE REFILLS. Burnell Blanks, MD Taking Active   augmented betamethasone dipropionate (DIPROLENE-AF) 0.05 % cream [id number] Yes [place], MD Taking Active   budesonide-formoterol [facility]) 160-4.5 MCG/ACT inhaler [id number] Yes Inhale 2 puffs into the lungs 2 (two) times daily. Colon Branch, MD Taking Active   clopidogrel (PLAVIX) 75 MG tablet [id number] Yes TAKE 1 TABLET EVERY DAY Burnell Blanks, MD Taking Active   ezetimibe (ZETIA) 10 MG tablet [id number] Yes Take 1 tablet (10 mg total) by mouth daily. Burnell Blanks, MD Taking Active   fenofibrate (TRICOR) 145 MG tablet [id number] Yes TAKE 1 TABLET EVERY DAY Colon Branch, MD Taking Active   folic acid (FOLVITE) 1 MG tablet [id number] Yes Take 1 tablet (1 mg total) by mouth daily. Roxan Hockey, MD Taking Active   furosemide (LASIX) 20 MG tablet [id number] Yes Take 2 tablets (40 mg total) by mouth in the morning. Colon Branch, MD Taking Active   gabapentin (NEURONTIN) 100 MG capsule [id number] Yes Take 1 capsule (100 mg total) by mouth  3 (three) times daily. Colon Branch, MD Taking Active   isosorbide mononitrate (IMDUR) 60 MG 24 hr tablet [id number] Yes Take 1.5 tablets (90 mg total) by mouth daily. Burnell Blanks, MD Taking Active   MELATONIN GUMMIES PO [id number] No Take 5 mg by mouth at bedtime as needed.  Patient not taking: Reported on 10/14/2021   [provider] Not Taking Active   nitroGLYCERIN (NITROSTAT) 0.4 MG SL tablet [id number] Yes Place 1 tablet (0.4 mg total) under the tongue every 5 (five) minutes x 3 doses as needed for chest pain. Colon Branch, MD Taking Active            Med Note [facility], Cheri Rous   Wed May 07, 2021 12:59 PM) PRN  pantoprazole (PROTONIX) 40 MG tablet [id number] Yes TAKE 1 TABLET TWICE  DAILY Burnell Blanks, MD Taking Active   spironolactone (ALDACTONE) 25 MG tablet [id number] Yes Take 0.5 tablets (12.5 mg total) by mouth daily. Burnell Blanks, MD Taking Active   tamsulosin [facility]) 0.4 MG CAPS capsule [id number] Yes Take 0.4 mg by mouth at bedtime. [provider] Taking Active            Med Note Juleen China, Deno Etienne   Tue Oct 14, 2021  2:31 PM) Reports increased to 0.4 mg twice a day per Dr. Milford Cage  thiamine 100 MG tablet [id number] Yes Take 1 tablet (100 mg total) by mouth daily. Roxan Hockey, MD Taking Active   traZODone (DESYREL) 100 MG tablet [id number] Yes Take 1 tablet (100 mg total) by mouth at bedtime. Colon Branch, MD Taking Active   valsartan (DIOVAN) 80 MG tablet [id number] Yes Take 1 tablet (80 mg total) by mouth daily. Burnell Blanks, MD Taking Active             Patient Active Problem List   Diagnosis Date Noted   COPD 0 AB 03/31/2021   (HFpEF) heart failure with preserved ejection fraction (HCC)    DOE (dyspnea on exertion) 11/07/2020   Diabetes ([place]) 03/18/2020   Prolapsed lumbar disc 01/25/2020   Closed fracture of lateral malleolus 01/25/2020   Hiatal hernia    Hepatic steatosis    Hemianopia, homonymous, right     ETOH abuse    Esophagitis    Chronic diastolic CHF (congestive heart failure) (HCC)    Chronic bronchitis ([place])    CAD (coronary artery disease)    PAD (peripheral artery disease) ([place]) 09/23/2016   Morbid obesity ([place])    Chronic pain syndrome 10/23/2015   GERD (gastroesophageal reflux disease) 08/07/2015   Pancreatitis, acute    PCP NOTES >>>>> 06/01/2015   Annual physical exam 05/31/2015   Acute ischemic stroke ([place]) 02/20/2015   Hx of cocaine abuse ([place]) 02/20/2015   BPH (benign prostatic hyperplasia) 02/20/2015   OSA (obstructive sleep apnea) ?? 05/05/2013   Noncompliance 05/05/2013   Anxiety and depression 05/05/2013   Erectile dysfunction 03/17/2012   Emphysema (subcutaneous) (surgical) resulting from a procedure 10/08/2008   Iron deficiency anemia 08/10/2008   ABNORMALITY OF GAIT 08/06/2008   Cigarette smoker 07/27/2008   Dyslipidemia 07/26/2008   Essential hypertension 07/26/2008   ST elevation myocardial infarction involving left circumflex coronary artery ([place]) 07/26/2008    Immunization History  Administered Date(s) Administered   PFIZER(Purple Top)SARS-COV-2 Vaccination 11/17/2019, 12/11/2019, 07/18/2020   Pneumococcal Polysaccharide-23 11/03/2016   Tdap 11/03/2016    Conditions to be addressed/monitored: CHF, CAD, HTN, HLD, Hypertriglyceridemia, COPD, DMII, and BPH; OSA; GERD; fatty liver; h/o strokePAD; sleep disturbance; anxiety/ depression  There are no care plans that you recently modified to display for this patient.   Medication Assistance:  none at this time but might need assistance if additional lipid lowering therapy needed in future.   Applied for LIS / extra help in 2023.  Patient's preferred pharmacy is:  [facility] [place], [place] - [address] [place] [address] Phone: [phone] Fax: [phone]  [place], [place] [address] Phone: [phone] Fax: [phone]   Uses pill box? No - patient has declined to use pill container but he is starting to use stickers on bottles to help identify time of day to take each medication  Follow Up:  Patient agrees to Care Plan and Follow-up.  Plan: follow up phone call with clinical pharmacist 4 to 6 weeks.  Cherre Robins, PharmD Clinical Pharmacist [facility]

## 2021-10-15 NOTE — Telephone Encounter (Signed)
[place] [facility] nurse manager to assist patient in getting tamsulosin and gabapentin moved to [facility]. ? ?Last gabapentin was sent to [facility] for 30 days or #90  ?Last tamsulsin also sent to [facility] for 90 days - by Dr Milford Cage ?

## 2021-10-16 MED ORDER — GABAPENTIN 100 MG PO CAPS: 100.0000 mg | ORAL_CAPSULE | Freq: Three times a day (TID) | ORAL | 1 refills | Status: DC

## 2021-10-16 MED ORDER — TAMSULOSIN HCL 0.4 MG PO CAPS: 0.4000 mg | ORAL_CAPSULE | Freq: Two times a day (BID) | ORAL | 1 refills | Status: DC

## 2021-10-16 NOTE — Patient Instructions (Signed)
Mr [place] ?It was a pleasure speaking with you today.  ?I have attached a summary of our visit today and information about your health goals.  ?Patient Goals/Self-Care Activities ?take medications as prescribed,  ?focus on medication adherence by utilizing mail order pharmacy and contacting clinical pharmacist [name] [place] [phone] or [phone]) about any medications questions or concerns,  ?check blood pressure 2 to 3 times per week, document, and provide at future appointments,  ?weigh daily, and contact provider if weight gain of more than 3 lbs in 24 hours or 5 lbs in 1 week ?We have sent in prescriptions for 90 days to [facility] mail order pharmacy for tamsulosin and gabapentin. Please contact our office if you have not received by 10/27/2021 ? ?Our next appointment is by telephone on Wednesday, November 26, 2021 at 3:45pm ? ?Please call the care guide team at [phone] if you need to cancel or reschedule your appointment.  ? ? ?If you have any questions or concerns, please feel free to contact me either at the phone number below or with a MyChart message.  ? ?Keep up the good work! ? ?Cherre Robins, PharmD ?Clinical Pharmacist ?[facility] ?[facility] ?[phone] (direct line)  ?[phone] (main office number) ? ?Current Barriers:  ?Unable to independently monitor therapeutic efficacy ?Unable to achieve control of Hyperlipidemia / high cholesterol ?Unable to maintain control of blood pressure and heart disease ?Does not adhere to prescribed medication regimen (improved)  ?Does not contact provider office for questions/concerns ? ?Pharmacist Clinical Goal(s):  ?Over the next 90 days, patient will verbalize ability to afford treatment regimen ?achieve adherence to monitoring guidelines and medication adherence to achieve therapeutic efficacy ?achieve control of Hyperlipidemia as evidenced by LDL <70 ?maintain control of Heart disease, blood pressure and type 2 diabetes as  evidenced by attaining goals listed below  ?adhere to prescribed medication regimen as evidenced by refill history ?contact provider office for questions/concerns as evidenced notation of same in electronic health record through collaboration with PharmD and provider.  ? ?Interventions: ?1:1 collaboration with Colon Branch, MD regarding development and update of comprehensive plan of care as evidenced by provider attestation and co-signature ?Inter-disciplinary care team collaboration (see longitudinal plan of care) ?Comprehensive medication review performed; medication list updated in electronic medical record ? ?Diabetes: ?Goal A1c < 6.5% ?Lab Results  ?Component Value Date  ? HGBA1C 5.9 (H) 09/05/2021  ? ?Current treatment:  None ?Interventions:   ?Counseled on A1c goal ?Reviewed home blood goals  ?Fasting blood glucose goal (before meals) = 80 to 130 ?Blood glucose goal after a meal = less than 180  ?Recommend recheck A1c with next labs. If >6.5% consider starting medication therapy ?Continue to limit intake of bread and sweets ?Great job with weight loss! ? ?Hypertension / Heart Disease:  ?Goal: decreased symptoms of heart disease, prevent exacerbations and BP <140/90.  ?Current treatment: ?Furosemide '20mg'$  - take 3 tablets = '60mg'$  daily in the morning ?Spironolactone '25mg'$  - take 0.5 tablet = 12.'5mg'$  each morning ?Valsartan '80mg'$  daily ?Interventions:   ?Reviewed blood pressure goal ?Continue to check blood pressure 2 to 3 times a week, record and bring to future appointments. ?Discussed signs and symptoms of worsening heart disease ?weight gain, shortness of breath, abdominal fullness, swelling in legs or abdomen, Fatigue and weakness, changes in ability to perform usual activities, persistent cough or wheezing with white or pink blood-tinged mucus, nausea and lack of appetite. Contact cardiologist or primary care physician if you experience any  of these symptoms.  ?Continue to check weight every day. Report weight  gain of more than 3 lbs in 24 hours or 5 lbs in 1 week.  ? ?Hyperlipidemia with elevated triglycerides / history of stroke: ?LDL goal <55; Triglycerides goal <150 ?Lab Results  ?Component Value Date  ? [place] 71 05/07/2021  ? ?Current treatment: ?Atorvastatin '80mg'$  daily ?Ezetimibe '10mg'$  daily  ?Fenofibrate '145mg'$  daily ?Aspirin '81mg'$  daily with breakfast (has not taken recently ?Clopidogrel '75mg'$  daily ?Isosorbide ER '60mg'$  0 take 1.5 tablets = '90mg'$  daily ?Nitroglycerin 0.'4mg'$  - place 1 tablet under the tongue and allow to dissolve as needed for chest pain ?Interventions:  ?Education provided about LDL and triglyceride goal ?Discussed benefits of getting LDL to goal and taking atorvastatin daily for worsening of heart disease / stroke ?Recommend recheck lipid panel with next labs.  ?Continue current medications listed above for heart health / cholesterol lowering / stroke prevention ? ? ?Chronic Obstructive Pulmonary Disease / sleep apnea: ?Current treatment: ?CPAP ?Symbicort (budesonide/formoterol) 160/4.5 - inhale 2 puffs into lungs twice a day ?Interventions:  ?Continue to follow up with pulmonologist  ?Use Symbicort - 2 puffs twice a day ?Remember to rinse mouth after using Symbicort.  ?Discontinue smoking or at least decrease number of cigarettes smoked per day.  ? ?Medication Management:  ?Goal: take medications as prescribed; consult clinical pharmacist if any questions or issues with medication access ?Current Pharmacy:  ?locally uses [facility].  ?[facility] ?Interventions:  ?Continue using either color stickers or color pill containers to help patient identify morning, afternoon, evening and night medications ?Reviewed medication adherence.   ?Consulted with Dr [name] to get 90 day refill for both gabapentin and tamsulosin.   Prescriptions sent to [facility]. ? ? ?Patient Goals/Self-Care Activities ?take medications as prescribed,  ?focus on medication adherence by utilizing mail order  pharmacy and contacting clinical pharmacist [name] [place] [phone] or [phone]) about any medications questions or concerns,  ?check blood pressure 2 to 3 times per week, document, and provide at future appointments,  ?weigh daily, and contact provider if weight gain of more than 3 lbs in 24 hours or 5 lbs in 1 week ?We have sent in prescriptions for 90 days to [facility] mail order pharmacy for tamsulosin and gabapentin. Please contact our office if you have not received by 10/27/2021 ? ?Follow Up Plan: Telephone follow up appointment with care management team member scheduled for:  4 to 6 weeks.   ? ?Patient verbalizes understanding of instructions and care plan provided today and agrees to view in [place]. Active MyChart status confirmed with patient.    ?

## 2021-10-16 NOTE — Telephone Encounter (Signed)
Refills sent

## 2021-10-16 NOTE — Telephone Encounter (Signed)
Okay to send 90 days of both medicines to [facility] ?

## 2021-10-17 DIAGNOSIS — M79671 Pain in right foot: Secondary | ICD-10-CM | POA: Diagnosis not present

## 2021-10-17 DIAGNOSIS — M25571 Pain in right ankle and joints of right foot: Secondary | ICD-10-CM | POA: Diagnosis not present

## 2021-10-17 DIAGNOSIS — F17200 Nicotine dependence, unspecified, uncomplicated: Secondary | ICD-10-CM | POA: Diagnosis not present

## 2021-10-22 ENCOUNTER — Ambulatory Visit: Payer: Medicare HMO | Admitting: Internal Medicine

## 2021-10-24 DIAGNOSIS — H02403 Unspecified ptosis of bilateral eyelids: Secondary | ICD-10-CM | POA: Diagnosis not present

## 2021-10-24 DIAGNOSIS — H5340 Unspecified visual field defects: Secondary | ICD-10-CM | POA: Diagnosis not present

## 2021-10-24 DIAGNOSIS — H02831 Dermatochalasis of right upper eyelid: Secondary | ICD-10-CM | POA: Diagnosis not present

## 2021-10-24 DIAGNOSIS — H5203 Hypermetropia, bilateral: Secondary | ICD-10-CM | POA: Diagnosis not present

## 2021-10-24 LAB — HM DIABETES EYE EXAM

## 2021-11-03 NOTE — Chronic Care Management (AMB) (Signed)
Per appt notes pt scheduled with pharmacist 11/26/2021 ?

## 2021-11-05 ENCOUNTER — Encounter: Payer: Self-pay | Admitting: Internal Medicine

## 2021-11-05 ENCOUNTER — Ambulatory Visit (INDEPENDENT_AMBULATORY_CARE_PROVIDER_SITE_OTHER): Payer: Medicare HMO | Admitting: Internal Medicine

## 2021-11-05 VITALS — BP 136/82 | HR 91 | Temp 97.9°F | Resp 20 | Ht 69.0 in | Wt 236.1 lb

## 2021-11-05 DIAGNOSIS — I10 Essential (primary) hypertension: Secondary | ICD-10-CM

## 2021-11-05 DIAGNOSIS — F17200 Nicotine dependence, unspecified, uncomplicated: Secondary | ICD-10-CM

## 2021-11-05 DIAGNOSIS — E1151 Type 2 diabetes mellitus with diabetic peripheral angiopathy without gangrene: Secondary | ICD-10-CM | POA: Diagnosis not present

## 2021-11-05 DIAGNOSIS — J449 Chronic obstructive pulmonary disease, unspecified: Secondary | ICD-10-CM

## 2021-11-05 DIAGNOSIS — I5032 Chronic diastolic (congestive) heart failure: Secondary | ICD-10-CM | POA: Diagnosis not present

## 2021-11-05 DIAGNOSIS — E559 Vitamin D deficiency, unspecified: Secondary | ICD-10-CM | POA: Diagnosis not present

## 2021-11-05 NOTE — Progress Notes (Signed)
? ?Subjective:  ? ? Patient ID: [name]., male    DOB: 27-Mar-1961, 61 y.o.   MRN: [id number] ? ?DOS:  11/05/2021 ?Type of visit - description: Follow-up ? ?Here for follow-up, since the last office visit saw cardiology, note reviewed. ?Also had a Modified barium swallowing study 10/02/2021 ?Reports in general he is doing okay. ?Still complains about a lot of chest congestion and mucus pooling in the throat. ?Denies any heartburn per se. ?No postnasal dripping ?Uses CPAP most nights. ?Occasional skip Lasix. ?Still smoking and drinking ? ?Review of Systems ?See above ? ?Past Medical History:  ?Diagnosis Date  ? (HFpEF) heart failure with preserved ejection fraction ([place])   ? Echo 6/22: EF 55-60, no RWMA, GR 3 DD, elevated LVEDP, normal RVSF, RVSP 53.2, mild to moderate AV sclerosis without stenosis, dilated aortic root (41 mm), ascending aorta 37 mm  ? Abnormality of gait   ? Anxiety   ? CAD (coronary artery disease)   ? a. 10/2008 Inferolateral STEMI: 3VD w/ occluded LCX (PTCA)-->CABG x 3 (LIMA->LAD, VG->OM, VG->PDA);  b. 06/2009 NSTEMI/PCI: VG->OM 100 ([facility]);  c. 05/2013 native 3VD, 3/3 patent grafts. d. 06/2016: Inferior STEMI w/ occluded SVG-OM (DES placed). LIMA-LAD and SVG-PDA patent.  ? Chronic bronchitis ([place])   ? Chronic diastolic CHF (congestive heart failure) ([place])   ? a. 02/2015 Echo: EF 50-55%, distal septal, apical, inferobasal HK, mild LVH, mild MR, mildly dil LA.  ? Cocaine use   ? Colon polyps   ? a. 09/2015 colonscopy: multiple sessile polyps, path negative for high grade dysplasia.  ? Depression   ? Emphysema (subcutaneous) (surgical) resulting from a procedure 10/2008.  ? Bleb resected at time of 10/2008 CABG. Marketed emphysema noted on surgical report.  ? Esophagitis   ? a. 2017 EGD: esophagitis, duodenitis.  ? ETOH abuse   ? GERD (gastroesophageal reflux disease)   ? Hemianopia, homonymous, right   ? "can't see out the right side of either eye since stroke in 2016/pt description 09/23/2016  ?  Hepatic steatosis   ? Hiatal hernia   ? History of nuclear stress test 08/2017  ? Nuclear stress test 1/19: EF 35, inf-lat and ant-lat, apical sept/apical scar; no ischemia; Intermediate Risk  ? Hyperlipidemia   ? Hypertension   ? Iron deficiency anemia 2010  ? Morbid obesity ([place])   ? Myocardial infarction [facility]) "several"  ? PAD (peripheral artery disease) ([place])   ? Pancreatitis 06/2015  ? Sleep apnea   ? "don't have a mask" (09/23/2016)  ? Stroke [facility]) 02/2015  ? a. 02/2015 Carotid U/S: 1-39% bilat ICA stenosis; "left me blind" (09/23/2016)  ? Tobacco abuse   ? still smokes  ? Tubular adenoma of colon   ? ? ?Past Surgical History:  ?Procedure Laterality Date  ? ABDOMINAL AORTOGRAM W/LOWER EXTREMITY N/A 09/23/2016  ? Procedure: Abdominal Aortogram w/Lower Extremity;  Surgeon: Wellington Hampshire, MD;  Location: [place] [facility];  Service: Cardiovascular;  Laterality: N/A;  ? CARDIAC CATHETERIZATION  11/07/2008  ? EF of 50-55% -- normal LV systolic function, acute inferolateral ST elevation MI,  PTCA of the circumflex artery -- [name]. [name], [name]  ? CARDIAC CATHETERIZATION N/A 07/07/2016  ? Procedure: Left Heart Cath and Cors/Grafts Angiography;  Surgeon: [name], MD;  Location: [place] [facility];  Service: Cardiovascular;  Laterality: N/A;  ? CARDIAC CATHETERIZATION N/A 07/07/2016  ? Procedure: Coronary Stent Intervention;  Surgeon: [name], MD;  Location: [place] [facility];  Service: Cardiovascular;  Laterality: N/A;  ? CORONARY ANGIOPLASTY    ? CORONARY ARTERY BYPASS GRAFT    ? "CABG X4"  ? LEFT HEART CATH AND CORS/GRAFTS ANGIOGRAPHY N/A 10/01/2017  ? Procedure: LEFT HEART CATH AND CORS/GRAFTS ANGIOGRAPHY;  Surgeon: Burnell Blanks, MD;  Location: [place] [facility];  Service: Cardiovascular;  Laterality: N/A;  ? LEFT HEART CATHETERIZATION WITH CORONARY/GRAFT ANGIOGRAM N/A 05/15/2013  ? Procedure: LEFT HEART CATHETERIZATION WITH Beatrix Fetters;  Surgeon: [name], MD;   Location: [facility] C[facility];  Service: Cardiovascular;  Laterality: N/A;  ? PERIPHERAL VASCULAR INTERVENTION  09/23/2016  ? Procedure: Peripheral Vascular Intervention;  Surgeon: Wellington Hampshire, MD;  Location: [place] [facility];  Service: Cardiovascular;;  Lt. SFA  ? ? ?Current Outpatient Medications  ?Medication Instructions  ? aspirin 81 mg, Oral, Daily with breakfast  ? atorvastatin (LIPITOR) 80 MG tablet TAKE 1 TABLET (80 MG TOTAL) BY MOUTH DAILY AT 6 PM. NEED TO KEEP APPOINTMENT FOR ANY FUTURE REFILLS.  ? augmented betamethasone dipropionate (DIPROLENE-AF) 0.05 % cream APPLY TOPICALLY TWICE DAILY  ? budesonide-formoterol (SYMBICORT) 160-4.5 MCG/ACT inhaler 2 puffs, Inhalation, 2 times daily  ? clopidogrel (PLAVIX) 75 MG tablet TAKE 1 TABLET EVERY DAY  ? ezetimibe (ZETIA) 10 mg, Oral, Daily  ? fenofibrate (TRICOR) 145 MG tablet TAKE 1 TABLET EVERY DAY  ? folic acid (FOLVITE) 1 mg, Oral, Daily  ? furosemide (LASIX) 40 mg, Oral, Every morning  ? gabapentin (NEURONTIN) 100 mg, Oral, 3 times daily  ? isosorbide mononitrate (IMDUR) 90 mg, Oral, Daily  ? MELATONIN GUMMIES PO 5 mg, Oral, At bedtime PRN  ? nitroGLYCERIN (NITROSTAT) 0.4 mg, Sublingual, Every 5 min x3 PRN  ? pantoprazole (PROTONIX) 40 MG tablet TAKE 1 TABLET TWICE DAILY  ? spironolactone (ALDACTONE) 12.5 mg, Oral, Daily  ? tamsulosin (FLOMAX) 0.4 mg, Oral, 2 times daily  ? thiamine 100 mg, Oral, Daily  ? traZODone (DESYREL) 100 mg, Oral, Daily at bedtime  ? valsartan (DIOVAN) 80 mg, Oral, Daily  ? ? ?   ?Objective:  ? Physical Exam ?BP 136/82 (BP Location: Left Arm, Patient Position: Sitting, Cuff Size: Normal)   Pulse 91   Temp 97.9 ?F (36.6 ?C) (Oral)   Resp 20   Ht '5\' 9"'$  (1.753 m)   Wt 236 lb 2 oz (107.1 kg)   SpO2 90%   BMI 34.87 kg/m?  ?General:   ?Well developed, NAD, BMI noted. ?HEENT:  ?Normocephalic . Face symmetric, atraumatic ?Lungs:  ?Rhonchi bilaterally with cough. ?Increased expiratory time but no wheezing.  No crackles. ?Normal  respiratory effort, no intercostal retractions, no accessory muscle use. ?Heart: RRR,  no murmur.  ?Lower extremities: no pretibial edema bilaterally  ?Skin: Not pale. Not jaundice ?Neurologic:  ?alert & oriented X3.  ?Speech normal, gait appropriate for age and unassisted ?Psych--  ?Cognition and judgment appear intact.  ?Cooperative with normal attention span and concentration.  ?Behavior appropriate. ?No anxious or depressed appearing.  ? ?   ?Assessment   ? ?ASSESSMENT ?DM ?HTN ?Dyslipidemia ?CV: ?--CAD: 2010 angioplasty f/u by  CABG 2010, cath 2014 ?-- CP, cath 09/2017, patent grafts, medical treatment ?--PVD ?--Stroke 2008, 02-2015, 2020 ?-CHF: Heart failure with preserved EF ?Anxiety depression ?Pulm: ?---OSA: Sleep study 01/2017, significant sleep apnea noted ?--- hypoxemia (nocturnal O2) ?---COPD: Clinical diagnosis and CT findings, has not pursued PFTs ?Abnormal gait ?GI: ?Iron def anemia dx 03-2015  ?---cscope 09-2015 multiple polyps, repeat in one year ?---EGD-2017: Esophagitis, duodenitis, bx done ?Pancreatitis 06/2015.(EtOH  2 days prior to onset of symptoms) ?Etoh abuse ?H/o cocaine  ?Smoker  ?+FH: father prostate ca , dx late 42s ?Back pain:saw Ortho 01/2020, SXS felt to be mechanical due to: JD, deconditioning, residual weakness from the stroke.  PVD likely to be contributing. ?Vitamin D deficiency ? ? ?PLAN ?DM: Diet controlled, check A1c. ?HTN: BP today satisfactory, continue Lasix, Aldactone, Diovan.  Check BMP. ?Dyslipidemia: On triple therapy, last FLP satisfactory, no change ?CAD, CHF ?Saw cardiology 09/15/2021, they rec to continue aspirin Plavix statins.  Metoprolol was stopped due to bradycardia and dizziness.  His volume status was stable. ?OSA, hypoxemia, COPD: ?-On Symbicort, reports he uses oxygen at night, also is trying to use CPAP regularly ("still leaking a lot of air"). ?-Unfortunately he is back to smoking a pack a day. ?-He reports cough and chest congestion, on exam he has large airway  congestion. ?-Advised that the best next step for him would be to quit tobacco.  Offered referral to pulmonology. ?-O2 sat upon arrival 90%, after resting: 90% again, we will continue checking, at some point may need O2 su

## 2021-11-05 NOTE — Patient Instructions (Addendum)
Take medications as recommended ? ?Use your CPAP consistently every night along with oxygen ? ? ? ?GO TO THE LAB : Get the blood work   ? ? ?[place], [place] ?Come back for a physical exam in 3 to 4 months ?

## 2021-11-06 ENCOUNTER — Telehealth: Payer: Self-pay | Admitting: Internal Medicine

## 2021-11-06 LAB — BASIC METABOLIC PANEL
BUN: 19 mg/dL (ref 6–23)
CO2: 26 mEq/L (ref 19–32)
Calcium: 9.7 mg/dL (ref 8.4–10.5)
Chloride: 102 mEq/L (ref 96–112)
Creatinine, Ser: 1.08 mg/dL (ref 0.40–1.50)
GFR: 75.33 mL/min (ref >60.00)
Glucose, Bld: 97 mg/dL (ref 70–99)
Potassium: 4.1 mEq/L (ref 3.5–5.1)
Sodium: 138 mEq/L (ref 135–145)

## 2021-11-06 LAB — VITAMIN D 25 HYDROXY (VIT D DEFICIENCY, FRACTURES): VITD: 39.28 ng/mL (ref 30.00–100.00)

## 2021-11-06 LAB — HEMOGLOBIN A1C: Hgb A1c MFr Bld: 6.8 % — ABNORMAL HIGH (ref 4.6–6.5)

## 2021-11-06 NOTE — Telephone Encounter (Signed)
Left message for patient to call back and schedule Medicare Annual Wellness Visit (AWV)  ? ?AWVI due  08/10/2018 per [place] ?; please schedule at anytime with health coach ? ?This should be a 45 minute visit.  ? ?I gave my direct number [phone] ?

## 2021-11-06 NOTE — Assessment & Plan Note (Signed)
DM: Diet controlled, check A1c. ?HTN: BP today satisfactory, continue Lasix, Aldactone, Diovan.  Check BMP. ?Dyslipidemia: On triple therapy, last FLP satisfactory, no change ?CAD, CHF ?Saw cardiology 09/15/2021, they rec to continue aspirin Plavix statins.  Metoprolol was stopped due to bradycardia and dizziness.  His volume status was stable. ?OSA, hypoxemia, COPD: ?-On Symbicort, reports he uses oxygen at night, also is trying to use CPAP regularly ("still leaking a lot of air"). ?-Unfortunately he is back to smoking a pack a day. ?-He reports cough and chest congestion, on exam he has large airway congestion. ?-Advised that the best next step for him would be to quit tobacco.  Offered referral to pulmonology. ?-O2 sat upon arrival 90%, after resting: 90% again, we will continue checking, at some point may need O2 supplementation 24/7. ?Vitamin D deficiency: Reports good compliance with supplement, checking labs. ?EtOH: Still drinking, "I have 3 weeks last Friday,  trying to cut down". ?Swallowing study ?S/p  modified barium swallowing study 10/02/2021.  No obvious aspiration, chin tuck posture did not improve airway protection.  Small frequent meals, small bites/sips advised. ?RTC 3 months ? ?  ?

## 2021-11-07 ENCOUNTER — Other Ambulatory Visit: Payer: Self-pay | Admitting: Cardiovascular Disease

## 2021-11-07 DIAGNOSIS — I110 Hypertensive heart disease with heart failure: Secondary | ICD-10-CM | POA: Diagnosis not present

## 2021-11-07 DIAGNOSIS — N400 Enlarged prostate without lower urinary tract symptoms: Secondary | ICD-10-CM | POA: Diagnosis not present

## 2021-11-07 DIAGNOSIS — E1151 Type 2 diabetes mellitus with diabetic peripheral angiopathy without gangrene: Secondary | ICD-10-CM | POA: Diagnosis not present

## 2021-11-07 DIAGNOSIS — J449 Chronic obstructive pulmonary disease, unspecified: Secondary | ICD-10-CM | POA: Diagnosis not present

## 2021-11-11 ENCOUNTER — Telehealth: Payer: Self-pay | Admitting: Pharmacist

## 2021-11-11 ENCOUNTER — Ambulatory Visit (INDEPENDENT_AMBULATORY_CARE_PROVIDER_SITE_OTHER): Payer: Medicare HMO

## 2021-11-11 DIAGNOSIS — J449 Chronic obstructive pulmonary disease, unspecified: Secondary | ICD-10-CM

## 2021-11-11 DIAGNOSIS — I10 Essential (primary) hypertension: Secondary | ICD-10-CM

## 2021-11-11 DIAGNOSIS — I5032 Chronic diastolic (congestive) heart failure: Secondary | ICD-10-CM

## 2021-11-11 NOTE — Telephone Encounter (Signed)
Spoke with pharmacist at [facility] and cleared up issue with furosemide. They are processing all 4 meds needed - ezetimibe, furosemide, pantoprazole and valsartan. Should receive medication in 3 days.  ?

## 2021-11-11 NOTE — Telephone Encounter (Signed)
Called [employer]. Ezetimibe was in order with 3 other medications (pantoprazole, valsartan and furosemide). There was an issue with furosemide and other meds were held up.  ?Spoke with [employer] [facility] representative and she was able to split order so that ezetimibe, pantoprazole and valsartan could be filled today.  ? ?Representative states that furosemide has a note on it that it should be only refill by Dr [name] but Rx was written by Dr Glennie Hawk.  ?

## 2021-11-11 NOTE — Telephone Encounter (Signed)
Received message below:  ? ? ?Reivewed last Rx for ezetimibe - was written by Dr Julianne Handler 04/30/2022 for #90 with 3RFs. Patient should have refill remaining.  ?He will need to request for [employer] / [facility] to restart auto refill.  ?

## 2021-11-11 NOTE — Patient Instructions (Signed)
Visit Information ? ?Thank you for taking time to visit with me today. Please don't hesitate to contact me if I can be of assistance to you before our next scheduled telephone appointment. ? ?Following are the goals we discussed today:  ?Patient Goals/Self-Care Activities: ?Contact your clinical pharmacist Cherre Robins or Nurse Case Manager and/or your provider, if you continue to have difficulty obtaining your medications ?Smoking Cessation 1-800-QUIT-NOW ([phone]) ?Contact the equipment company where you obtained your CPAP to discuss your questions and accessory needs. ?Contact your Provider if condition does not improve or worsens ?follow rescue plan if symptoms flare-up ?know when to call the doctor weight gain of 3 pounds overnight, increased swelling in stomach, feet/legs or hands. Increased shortness of breath, having less energy than normal, new or worsening dizziness, if it is harder for you to breathe when lying down and you need to sit up to breathe or you feel that something is not right.  ?Continue to attend provider visits as recommended ?Continue to monitor for signs/symptoms of COPD worsening: increase cough, shortness of breath or mucus production change for 2 days or more, your number 1 priority is to call your doctor. ?Eat Healthy: variety of foods, fruits, vegetables, lean meats, healthy fats. Avoid processed foods, saturated transfats, minimize salt intake ?Review information on smoking cessation and plan to discuss at our next telephone call ? ?Our next appointment is by telephone on 11/28/21 at 1:00 pm ? ?Please call the care guide team at [phone] if you need to cancel or reschedule your appointment.  ? ?If you are experiencing a Mental Health or [place] or need someone to talk to, please call the Suicide and Crisis Lifeline: 988 ?call 1-800-273-TALK (toll free, 24 hour hotline)  ? ?Patient verbalizes understanding of instructions and care plan provided today and  agrees to view in [place]. Active MyChart status confirmed with patient.   ? ?Thea Silversmith, RN, MSN, BSN, CCM ?Care Management Coordinator ?[place] ?[phone]  ? ?Managing the Challenge of Quitting Smoking ?Quitting smoking is a physical and mental challenge. You will face cravings, withdrawal symptoms, and temptation. Before quitting, work with your health care provider to make a plan that can help you manage quitting. Preparation can help you quit and keep you from giving in. ?How to manage lifestyle changes ?Managing stress ?Stress can make you want to smoke, and wanting to smoke may cause stress. It is important to find ways to manage your stress. You might try some of the following: ?Practice relaxation techniques. ?Breathe slowly and deeply, in through your nose and out through your mouth. ?Listen to music. ?Soak in a bath or take a shower. ?Imagine a peaceful place or vacation. ?Get some support. ?Talk with family or friends about your stress. ?Join a support group. ?Talk with a counselor or therapist. ?Get some physical activity. ?Go for a walk, run, or bike ride. ?Play a favorite sport. ?Practice yoga. ? ?Medicines ?Talk with your health care provider about medicines that might help you deal with cravings and make quitting easier for you. ?Relationships ?Social situations can be difficult when you are quitting smoking. To manage this, you can: ?Avoid parties and other social situations where people might be smoking. ?Avoid alcohol. ?Leave right away if you have the urge to smoke. ?Explain to your family and friends that you are quitting smoking. Ask for support and let them know you might be a bit grumpy. ?Plan activities where smoking is not an option. ?General instructions ?Be  aware that many people gain weight after they quit smoking. However, not everyone does. To keep from gaining weight, have a plan in place before you quit and stick to the plan after you quit. Your plan should  include: ?Having healthy snacks. When you have a craving, it may help to: ?Eat popcorn, carrots, celery, or other cut vegetables. ?Chew sugar-free gum. ?Changing how you eat. ?Eat small portion sizes at meals. ?Eat 4-6 small meals throughout the day instead of 1-2 large meals a day. ?Be mindful when you eat. Do not watch television or do other things that might distract you as you eat. ?Exercising regularly. ?Make time to exercise each day. If you do not have time for a long workout, do short bouts of exercise for 5-10 minutes several times a day. ?Do some form of strengthening exercise, such as weight lifting. ?Do some exercise that gets your heart beating and causes you to breathe deeply, such as walking fast, running, swimming, or biking. This is very important. ?Drinking plenty of water or other low-calorie or no-calorie drinks. Drink 6-8 glasses of water daily. ? ?How to recognize withdrawal symptoms ?Your body and mind may experience discomfort as you try to get used to not having nicotine in your system. These effects are called withdrawal symptoms. They may include: ?Feeling hungrier than normal. ?Having trouble concentrating. ?Feeling irritable or restless. ?Having trouble sleeping. ?Feeling depressed. ?Craving a cigarette. ?To manage withdrawal symptoms: ?Avoid places, people, and activities that trigger your cravings. ?Remember why you want to quit. ?Get plenty of sleep. ?Avoid coffee and other caffeinated drinks. These may worsen some of your symptoms. ?These symptoms may surprise you. But be assured that they are normal to have when quitting smoking. ?How to manage cravings ?Come up with a plan for how to deal with your cravings. The plan should include the following: ?A definition of the specific situation you want to deal with. ?An alternative action you will take. ?A clear idea for how this action will help. ?The name of someone who might help you with this. ?Cravings usually last for 5-10 minutes.  Consider taking the following actions to help you with your plan to deal with cravings: ?Keep your mouth busy. ?Chew sugar-free gum. ?Suck on hard candies or a straw. ?Brush your teeth. ?Keep your hands and body busy. ?Change to a different activity right away. ?Squeeze or play with a ball. ?Do an activity or a hobby, such as making bead jewelry, practicing needlepoint, or working with wood. ?Mix up your normal routine. ?Take a short exercise break. Go for a quick walk or run up and down stairs. ?Focus on doing something kind or helpful for someone else. ?Call a friend or family member to talk during a craving. ?Join a support group. ?Contact a quitline. ?Where to find support ?To get help or find a support group: ?Call the [facility]'s Smoking Quitline: 1-800-QUIT NOW (507)791-2114) ?Visit the website of the Substance Abuse and Mental Health Services Administration: ktimeonline.com ?Text QUIT to SmokefreeTXT: 315176 ?Where to find more information ?Visit these websites to find more information on quitting smoking: ?[place]: www.smokefree.gov ?American Lung Association: www.lung.org ?American Cancer Society: www.cancer.org ?Centers for Disease Control and Prevention: http://www.wolf.info/ ?American Heart Association: www.heart.org ?Contact a health care provider if: ?You want to change your plan for quitting. ?The medicines you are taking are not helping. ?Your eating feels out of control or you cannot sleep. ?Get help right away if: ?You feel depressed or become very anxious. ?  Summary ?Quitting smoking is a physical and mental challenge. You will face cravings, withdrawal symptoms, and temptation to smoke again. Preparation can help you as you go through these challenges. ?Try different techniques to manage stress, handle social situations, and prevent weight gain. ?You can deal with cravings by keeping your mouth busy (such as by chewing gum), keeping your hands and body busy, calling family or  friends, or contacting a quitline for people who want to quit smoking. ?You can deal with withdrawal symptoms by avoiding places where people smoke, getting plenty of rest, and avoiding drinks with caffein

## 2021-11-11 NOTE — Telephone Encounter (Signed)
I did not call patient. [name], Nurse navigator called him and left message about prescription but since he might not have listened to VM, I called him. Unable to reach patient. Lm on VM - all has been cleared up with medications, he should expect in 3 days. Please call if you are out of furosemide or valsartan, we can call in a few days to local pharmacy. ?

## 2021-11-11 NOTE — Chronic Care Management (AMB) (Addendum)
?Chronic Care Management  ? ?CCM RN Visit Note ? ?11/11/2021 ?Name: [name]. MRN: [id number] DOB: [birth year] ? ?Subjective: ?[name]. is a 61 y.o. year old male who is a primary care patient of Colon Branch, MD. The care management team was consulted for assistance with disease management and care coordination needs.   ? ?Engaged with patient by telephone for follow up visit in response to provider referral for case management and/or care coordination services.  ? ?Consent to Services:  ?The patient was given information about Chronic Care Management services, agreed to services, and gave verbal consent prior to initiation of services.  Please see initial visit note for detailed documentation.  ? ?Patient agreed to services and verbal consent obtained.  ? ?Assessment: Review of patient past medical history, allergies, medications, health status, including review of consultants reports, laboratory and other test data, was performed as part of comprehensive evaluation and provision of chronic care management services.  ? ?SDOH (Social Determinants of Health) assessments and interventions performed:   ? ?CCM Care Plan ? ?Allergies  ?Allergen Reactions  ? Bupropion Nausea Only  ? ? ?Outpatient Encounter Medications as of 11/11/2021  ?Medication Sig Note  ? aspirin EC 81 MG EC tablet Take 1 tablet (81 mg total) by mouth daily with breakfast.   ? atorvastatin (LIPITOR) 80 MG tablet TAKE 1 TABLET (80 MG TOTAL) BY MOUTH DAILY AT 6 PM. NEED TO KEEP APPOINTMENT FOR ANY FUTURE REFILLS.   ? augmented betamethasone dipropionate (DIPROLENE-AF) 0.05 % cream APPLY TOPICALLY TWICE DAILY   ? budesonide-formoterol (SYMBICORT) 160-4.5 MCG/ACT inhaler Inhale 2 puffs into the lungs 2 (two) times daily.   ? clopidogrel (PLAVIX) 75 MG tablet TAKE 1 TABLET EVERY DAY   ? fenofibrate (TRICOR) 145 MG tablet TAKE 1 TABLET EVERY DAY   ? folic acid (FOLVITE) 1 MG tablet Take 1 tablet (1 mg total) by mouth daily.   ? furosemide  (LASIX) 20 MG tablet Take 20-40 mg by mouth in the morning.   ? gabapentin (NEURONTIN) 100 MG capsule Take 1 capsule (100 mg total) by mouth 3 (three) times daily.   ? isosorbide mononitrate (IMDUR) 60 MG 24 hr tablet Take 1.5 tablets (90 mg total) by mouth daily.   ? MELATONIN GUMMIES PO Take 5 mg by mouth at bedtime as needed.   ? nitroGLYCERIN (NITROSTAT) 0.4 MG SL tablet Place 1 tablet (0.4 mg total) under the tongue every 5 (five) minutes x 3 doses as needed for chest pain. 05/07/2021: PRN  ? pantoprazole (PROTONIX) 40 MG tablet TAKE 1 TABLET TWICE DAILY   ? spironolactone (ALDACTONE) 25 MG tablet Take 0.5 tablets (12.5 mg total) by mouth daily.   ? tamsulosin (FLOMAX) 0.4 MG CAPS capsule Take 1 capsule (0.4 mg total) by mouth in the morning and at bedtime.   ? thiamine 100 MG tablet Take 1 tablet (100 mg total) by mouth daily.   ? traZODone (DESYREL) 100 MG tablet Take 1 tablet (100 mg total) by mouth at bedtime.   ? valsartan (DIOVAN) 80 MG tablet Take 1 tablet (80 mg total) by mouth daily.   ? ezetimibe (ZETIA) 10 MG tablet Take 1 tablet (10 mg total) by mouth daily. (Patient not taking: Reported on 11/11/2021)   ? ?No facility-administered encounter medications on file as of 11/11/2021.  ? ? ?Patient Active Problem List  ? Diagnosis Date Noted  ? COPD 0 AB 03/31/2021  ? (HFpEF) heart failure with preserved ejection fraction ([place])   ?  DOE (dyspnea on exertion) 11/07/2020  ? Diabetes ([place]) 03/18/2020  ? Prolapsed lumbar disc 01/25/2020  ? Hiatal hernia   ? Hepatic steatosis   ? Hemianopia, homonymous, right   ? ETOH abuse   ? Esophagitis   ? Chronic diastolic CHF (congestive heart failure) ([place])   ? Chronic bronchitis ([place])   ? CAD (coronary artery disease)   ? PAD (peripheral artery disease) ([place]) 09/23/2016  ? Morbid obesity ([place])   ? Chronic pain syndrome 10/23/2015  ? GERD (gastroesophageal reflux disease) 08/07/2015  ? Pancreatitis, acute   ? PCP NOTES >>>>> 06/01/2015  ? Annual physical exam 05/31/2015  ?  Acute ischemic stroke ([place]) 02/20/2015  ? Hx of cocaine abuse ([place]) 02/20/2015  ? BPH (benign prostatic hyperplasia) 02/20/2015  ? OSA (obstructive sleep apnea) ?? 05/05/2013  ? Noncompliance 05/05/2013  ? Anxiety and depression 05/05/2013  ? Erectile dysfunction 03/17/2012  ? Emphysema (subcutaneous) (surgical) resulting from a procedure 10/08/2008  ? Iron deficiency anemia 08/10/2008  ? ABNORMALITY OF GAIT 08/06/2008  ? Cigarette smoker 07/27/2008  ? Dyslipidemia 07/26/2008  ? Essential hypertension 07/26/2008  ? ST elevation myocardial infarction involving left circumflex coronary artery ([place]) 07/26/2008  ? ? ?Conditions to be addressed/monitored:CHF, CAD, HTN, COPD, GERD, and Smoking Cessation ? ?Care Plan : RN Care Management Plan of Care  ?Updates made by Luretha Rued, RN since 11/11/2021 12:00 AM  ?  ? ?Problem: Chronic Disease Managment Education and/or Care Coordination needs   ?Priority: High  ?  ? ?Long-Range Goal: Development of plan of care for Chronic Disease Management and /or Care Coordination Needs   ?Start Date: 07/01/2021  ?Expected End Date: 02/13/2022  ?Priority: High  ?Note:   ?Current Barriers: last office visit with PCP on 11/05/21-reports chest congestion/cough. He states he has been using Robitussion. He states he knows he needs to do better with his health and is interested in smoking cessation. He denies any signs/symptoms of heart failure exacerbation. He states he has been taking his medications, but is out of ezitimibe. He states his sister went online and it said the autorefill was cancelled.   ?Knowledge Deficits related to plan of care for management of CHF, CAD, HTN, COPD, DMII, and GERD  ?Care Coordination needs related to health care management of chronic conditions  ?Chronic Disease Management support and education needs related to CHF, CAD, HTN, COPD, DMII, and GERD ? ?RNCM Clinical Goal(s):  ?Patient will verbalize basic understanding of CHF, CAD, HTN, COPD, DMII, and GERD  disease process and self health management plan as evidenced by self report of taking and chart review of taking medications as prescribed, attending provider visits as scheduled ?demonstrate improved adherence to prescribed treatment plan for CHF, CAD, HTN, COPD, DMII, and GERD as evidenced by self report and/or chart notation of taking medications as precribed, calling provider with questions concerns, attending provider visits. ?work with pharmacist to address Medication procurement related to CHF, CAD, HTN, COPD, DMII, and GERD as evidenced by review of EMR and patient or pharmacist report    ?work with community resource care guide to address needs related to Transportation and Limited access to food as evidenced by patient and/or community resource care guide support    through collaboration with Consulting civil engineer, provider, and care team. Transportation resources discussed with patient by care guide.  ? ?Interventions: ?1:1 collaboration with primary care provider regarding development and update of comprehensive plan of care as evidenced by provider attestation and co-signature ?Inter-disciplinary care team collaboration (  see longitudinal plan of care) ?Evaluation of current treatment plan related to  self management and patient's adherence to plan as established by provider ?Collaboration and Care Coordinating with embedded clinical pharmacist, Tammy Eckard regarding medications. RNCM relayed the message to Mr. Kristiansen he should have refills for ezetimibe and that [facility] will need to be called to request auto-refill be turned back on. Mr. Captain voiced understanding and stated he will ask his sister to call. Also noted: clinical pharmacist contacted [facility] regarding medications and they should be processing his medications now. RNCM called Mr. Gaunt to update, but no answer- general message left.  ? ?GERD Interventions: Goal on track:  Yes. Long Term Goal ?Evaluation of current treatment plan related to GERD,  self-management and patient's adherence to plan as established by provider. ?Medications reviewed. Encouraged to take as prescribed.  ?Encouraged to attend provider visits as scheduled ? ?Hypertension Inter

## 2021-11-11 NOTE — Telephone Encounter (Signed)
Pt stated he got a call from our number, but did not know what it was regarding. Please advise if it was regarding previous messages below.  ?

## 2021-11-19 ENCOUNTER — Other Ambulatory Visit: Payer: Self-pay | Admitting: Internal Medicine

## 2021-11-24 ENCOUNTER — Other Ambulatory Visit: Payer: Self-pay | Admitting: Cardiovascular Disease

## 2021-11-26 ENCOUNTER — Ambulatory Visit: Payer: Medicare HMO | Admitting: Pharmacist

## 2021-11-26 ENCOUNTER — Telehealth: Payer: Self-pay | Admitting: Cardiovascular Disease

## 2021-11-26 DIAGNOSIS — F17210 Nicotine dependence, cigarettes, uncomplicated: Secondary | ICD-10-CM

## 2021-11-26 DIAGNOSIS — J449 Chronic obstructive pulmonary disease, unspecified: Secondary | ICD-10-CM

## 2021-11-26 DIAGNOSIS — E785 Hyperlipidemia, unspecified: Secondary | ICD-10-CM

## 2021-11-26 DIAGNOSIS — N400 Enlarged prostate without lower urinary tract symptoms: Secondary | ICD-10-CM

## 2021-11-26 DIAGNOSIS — E1151 Type 2 diabetes mellitus with diabetic peripheral angiopathy without gangrene: Secondary | ICD-10-CM

## 2021-11-26 DIAGNOSIS — I10 Essential (primary) hypertension: Secondary | ICD-10-CM

## 2021-11-26 DIAGNOSIS — I5032 Chronic diastolic (congestive) heart failure: Secondary | ICD-10-CM

## 2021-11-26 DIAGNOSIS — F17200 Nicotine dependence, unspecified, uncomplicated: Secondary | ICD-10-CM

## 2021-11-26 DIAGNOSIS — I739 Peripheral vascular disease, unspecified: Secondary | ICD-10-CM

## 2021-11-26 NOTE — Telephone Encounter (Signed)
Pt wanted to ask about his right leg.  Since he broke it years ago - it hurts to walk and stays numb.  This is unchanged but wondering how the circulation is since previously was told there was some blockage present in that leg.  ? ?I have scheduled his overdue follow up with Dr. Fletcher Anon for 12/09/21.  ?

## 2021-11-26 NOTE — Patient Instructions (Signed)
Mr. Setters ?It was a pleasure speaking with you  ?Below is a summary of your health goals and care plan ? ?Patient Goals/Self-Care Activities ?Over the next 90 days, patient will:  ?take medications as prescribed,  ?focus on medication adherence by utilizing mail order pharmacy and contacting clinical pharmacist [name] [place] [phone] or [phone]) about any medications questions or concerns,  ?check blood pressure 2 to 3 times per week, document, and provide at future appointments,  ?weigh daily, and contact provider if weight gain of more than 3 lbs in 24 hours or 5 lbs in 1 week ?Contact cariology office about seeing Dr Angelena Form or Dr Fletcher Anon regarding leg pain and urinary frequency with furosemide.  ?Contact Dr Newsome's office - [facility] regarding urinary frequency. ? ?Follow Up Plan: Telephone follow up appointment with care management team member scheduled for:  4 to 6 weeks.    ? ? ? ?If you have any questions or concerns, please feel free to contact me either at the phone number below or with a MyChart message.  ? ?Keep up the good work! ? ?Cherre Robins, PharmD ?Clinical Pharmacist ?[facility] ?[place] ?[phone] (direct line)  ?[phone] (main office number) ? ? ?Patient verbalizes understanding of instructions and care plan provided today and agrees to view in [place]. Active MyChart status confirmed with patient.    ?

## 2021-11-26 NOTE — Chronic Care Management (AMB) (Signed)
// ? ? ?Chronic Care Management ?Pharmacy Note ? ?11/26/2021 ?Name:  [name]. MRN:  [id number] DOB:  03/25/1961 ? ?Summary:  ?Reviewed patient medication list. Checked for needed refills. Only noted that he might been Symbicort inhaler soon but per patient he has 3 inhalers on hand.  ?Patient had several complaints today.  ?Leg Pain: He states that he is having more difficulty walking. He is not sure if this due to neuropathy but patient is concerned that his right legs has a blockage. 09/23/2016 patient had occluded left SFA and Dr Fletcher Anon performed a directional atherectomy of the left SFA followed by drug-coated balloon angioplasty and spot stenting in the midsegment. Dr Fletcher Anon also noted (1) No significant aortoiliac disease other than occluded internal iliac arteries.(2) Significant proximal right SFA disease with three-vessel runoff below the knee. Right SFA endovascular intervention can be performed if the patient reports good response on the left side but patient may not have followed up with Dr Fletcher Anon. Tried to call cardio office today to either get appointment with Dr Angelena Form or Dr Fletcher Anon. Left message with scheduler with patient's contact information. ?Will also discuss with patient's PCP about possibly titrating gabapentin over the next 2 weeks to 351m 3 times a day. Will need to monitor for dizziness / drowsiness.  ?CHF: Patient is checking weight a few times per week. Weight today was 236. Discussed importance of checking weight daily. Report weight gain of more than 3 lbs in 24 hours or 5 lbs in 1 weekDiscussed signs and symptoms of CHF exacerbation - weight gain, SOB, abdominal fullness, swelling in legs or abdomen, Fatigue and weakness, changes in ability to perform usual activities, persistent cough or wheezing with white or pink blood-tinged mucus, nausea and lack of appetite.  ?Urinary incontinence: had episode of incontinence in a store recently. He last saw urologist 08/2021. At that time  patient reported that tamsulosin 0.448mbid was working well. Patient is convinced that furosemide is cause of incontinence and while it can worsen urinary frequency, he is on a low dose of furosemide 20106maily (takes 86m56m needed). Recommended he discuss with cardiologist, Dr McAlAngelena Form urologist, Dr NewsMilford Cage ?Appointment for AWV made today for next week 12/05/2021 ? ?Subjective: ?[name] an 61 y34. year old male who is a primary patient of [name], JoseAlda Berthold.  The CCM team was consulted for assistance with disease management and care coordination needs.   ? ?Collaboration with patient  for follow up visit in response to provider referral for pharmacy case management and/or care coordination services.  ? ?Consent to Services:  ?The patient was given information about Chronic Care Management services, agreed to services, and gave verbal consent prior to initiation of services.  Please see initial visit note for detailed documentation.  ? ?Patient Care Team: ?[name],Colon Branch as PCP - General (Internal Medicine) ?McAlBurnell Blanks as PCP - Cardiology (Cardiology) ?HollRalene Bathe as Consulting Physician (Ophthalmology) ?Pyrtle, Jay Lajuan Lines as Consulting Physician (Gastroenterology) ?BeanSusa Day as Consulting Physician (Orthopedic Surgery) ?WallLuretha Rued as Case Manager ?EckaCherre RobinsH-CPP (Pharmacist) ?NewmRozetta Nunnery (Inactive) as Consulting Physician (Otolaryngology) ? ? ?Objective: ? ?Lab Results  ?Component Value Date  ? CREATININE 1.08 11/05/2021  ? CREATININE 1.61 (H) 09/05/2021  ? CREATININE 1.02 04/11/2021  ? ? ?Lab Results  ?Component Value Date  ? HGBA1C 6.8 (H) 11/05/2021  ? ?Last diabetic Eye exam:  ?Lab Results  ?  Component Value Date/Time  ? HMDIABEYEEXA No Retinopathy 06/30/2021 12:00 AM  ?  ?Last diabetic Foot exam: No results found for: HMDIABFOOTEX  ? ?   ?Component Value Date/Time  ? CHOL 141 05/07/2021 1347  ? CHOL 171 11/14/2019 1441  ?  TRIG 142.0 05/07/2021 1347  ? HDL 41.70 05/07/2021 1347  ? HDL 37 (L) 11/14/2019 1441  ? CHOLHDL 3 05/07/2021 1347  ? VLDL 28.4 05/07/2021 1347  ? [place] 71 05/07/2021 1347  ? [place] 116 (H) 11/14/2019 1441  ? LDLDIRECT 116.0 05/12/2013 1517  ? ? ? ?  Latest Ref Rng & Units 04/11/2021  ? 10:56 AM 07/10/2020  ?  2:45 PM 11/26/2018  ? 12:22 PM  ?Hepatic Function  ?Total Protein 6.5 - 8.1 g/dL 7.9   7.1   7.8    ?Albumin 3.5 - 5.0 g/dL 4.3   4.0   3.9    ?AST 15 - 41 U/L _0 ?ALT 0 - 44 U/L _1 ?Alk Phosphatase 38 - 126 U/L 66   135   88    ?Total Bilirubin 0.3 - 1.2 mg/dL 1.5   1.2   2.1    ? ? ?Lab Results  ?Component Value Date/Time  ? TSH 1.895 07/07/2016 06:31 PM  ? TSH 1.88 05/05/2013 12:14 PM  ? TSH 2.290 **Test methodology is 3rd generation TSH** 06/23/2009 07:55 AM  ? ? ? ?  Latest Ref Rng & Units 09/05/2021  ?  2:57 PM 04/11/2021  ? 10:56 AM 12/27/2019  ?  3:55 PM  ?CBC  ?WBC 3.8 - 10.8 Thousand/uL 9.1   11.0   7.4    ?Hemoglobin 13.2 - 17.1 g/dL 16.2   15.2   15.5    ?Hematocrit 38.5 - 50.0 % 47.8   45.9   47.6    ?Platelets 140 - 400 Thousand/uL 209   211   221.0    ? ? ?Lab Results  ?Component Value Date/Time  ? VD25OH 39.28 11/05/2021 04:04 PM  ? VD25OH 10.8 (L) 11/03/2016 02:53 PM  ? ? ?Clinical ASCVD: Yes  ?The ASCVD Risk score (Arnett DK, et al., 2019) failed to calculate for the following reasons: ?  The patient has a prior MI or stroke diagnosis   ? ? ?Social History  ? ?Tobacco Use  ?Smoking Status Every Day  ? Packs/day: 0.50  ? Years: 45.00  ? Pack years: 22.50  ? Types: Cigarettes  ? Start date: 54  ?Smokeless Tobacco Never  ?Tobacco Comments  ? 1 pack daily-03/10/21  ? ?BP Readings from Last 3 Encounters:  ?11/05/21 136/82  ?09/15/21 116/78  ?09/05/21 122/68  ? ?Pulse Readings from Last 3 Encounters:  ?11/05/21 91  ?09/15/21 (!) 52  ?09/05/21 (!) 56  ? ?Wt Readings from Last 3 Encounters:  ?11/05/21 236 lb 2 oz (107.1 kg)  ?09/15/21 229 lb 3.2 oz (104 kg)  ?09/05/21 224 lb 4  oz (101.7 kg)  ? ? ?Assessment: Review of patient past medical history, allergies, medications, health status, including review of consultants reports, laboratory and other test data, was performed as part of comprehensive evaluation and provision of chronic care management services.  ? ?SDOH:  (Social Determinants of Health) assessments and interventions performed:  ? ? ? ? ? ?[place] ? ?Allergies  ?Allergen Reactions  ? Bupropion Nausea Only  ? ? ?Medications Reviewed Today   ? ? Reviewed by  Cherre Robins, RPH-CPP (Pharmacist) on 11/26/21 at [facility] List Status: <None>  ? ?Medication Order Taking? Sig Documenting Provider Last Dose Status Informant  ?aspirin EC 81 MG EC tablet [id number] Yes Take 1 tablet (81 mg total) by mouth daily with breakfast. Roxan Hockey, MD Taking Active   ?atorvastatin (LIPITOR) 80 MG tablet [id number] Yes TAKE 1 TABLET EVERY DAY AT 6:00PM Burnell Blanks, MD Taking Active   ?augmented betamethasone dipropionate (DIPROLENE-AF) 0.05 % cream [id number] Yes APPLY TOPICALLY TWICE DAILY Colon Branch, MD Taking Active   ?B COMPLEX-C ER PO [id number] Yes Take 1 tablet by mouth daily. [provider] Taking Active   ?budesonide-formoterol (SYMBICORT) 160-4.5 MCG/ACT inhaler [id number] Yes Inhale 2 puffs into the lungs 2 (two) times daily. Colon Branch, MD Taking Active   ?Cholecalciferol (VITAMIN D) 125 MCG (5000 UT) CAPS [id number] Yes Take 1 capsule by mouth daily. [provider] Taking Active   ?clopidogrel (PLAVIX) 75 MG tablet [id number] Yes TAKE 1 TABLET EVERY DAY Burnell Blanks, MD Taking Active   ?Cyanocobalamin (VITAMIN B-12) 5000 MCG SUBL [id number] Yes Place 1 tablet under the tongue daily. [provider] Taking Active   ?ezetimibe (ZETIA) 10 MG tablet [id number] Yes Take 1 tablet (10 mg total) by mouth daily. Burnell Blanks, MD Taking Active   ?fenofibrate (TRICOR) 145 MG tablet [id number] Yes TAKE 1 TABLET EVERY DAY Colon Branch,  MD Taking Active   ?folic acid (FOLVITE) 1 MG tablet [id number] No Take 1 tablet (1 mg total) by mouth daily. Roxan Hockey, MD Unknown Active   ?furosemide (LASIX) 20 MG tablet [id number] Yes Take

## 2021-11-26 NOTE — Telephone Encounter (Signed)
Patient is requesting to speak with Dr. Camillia Herter nurse regarding stent placed 5 years ago. ?

## 2021-11-28 ENCOUNTER — Ambulatory Visit: Payer: Medicare HMO

## 2021-11-28 DIAGNOSIS — I10 Essential (primary) hypertension: Secondary | ICD-10-CM

## 2021-11-28 DIAGNOSIS — J449 Chronic obstructive pulmonary disease, unspecified: Secondary | ICD-10-CM

## 2021-11-28 DIAGNOSIS — K219 Gastro-esophageal reflux disease without esophagitis: Secondary | ICD-10-CM

## 2021-11-28 NOTE — Patient Instructions (Signed)
Visit Information ? ?Thank you for taking time to visit with me today. Please don't hesitate to contact me if I can be of assistance to you before our next scheduled telephone appointment. ? ?Following are the goals we discussed today:  ?Patient Goals/Self-Care Activities: ?Smoking Cessation 1-800-QUIT-NOW [phone]) ?Contact your Provider if condition does not improve or worsens ?Schedule an appointment with pulmonologist re: GERD and CPAP machine ?follow rescue plan if symptoms flare-up ?know when to call the doctor weight gain of 3 pounds overnight, increased swelling in stomach, feet/legs or hands. Increased shortness of breath, having less energy than normal, new or worsening dizziness, if it is harder for you to breathe when lying down and you need to sit up to breathe or you feel that something is not right.  ?Continue to attend provider visits as recommended ?Continue to monitor for signs/symptoms of COPD worsening: increase cough, shortness of breath or mucus production change for 2 days or more, your number 1 priority is to call your doctor. ?Eat Healthy: variety of foods, fruits, vegetables, lean meats, healthy fats. Avoid processed foods, saturated transfats, minimize salt intake ? ?Our next appointment is by telephone on 12/15/21 at 1:00 pm ? ?Please call the care guide team at [phone] if you need to cancel or reschedule your appointment.  ? ?If you are experiencing a Mental Health or [place] or need someone to talk to, please call the Suicide and Crisis Lifeline: 988 ?call 1-800-273-TALK (toll free, 24 hour hotline)  ? ?Patient verbalizes understanding of instructions and care plan provided today and agrees to view in [place]. Active MyChart status confirmed with patient.   ? ?Thea Silversmith, RN, MSN, BSN, CCM ?Care Management Coordinator ?[facility] ?[phone]  ? ?Food Choices for Gastroesophageal Reflux Disease, Adult ?When you have gastroesophageal reflux  disease (GERD), the foods you eat and your eating habits are very important. Choosing the right foods can help ease your discomfort. Think about working with a food expert (dietitian) to help you make good choices. ?What are tips for following this plan? ?Reading food labels ?Look for foods that are low in saturated fat. Foods that may help with your symptoms include: ?Foods that have less than 5% of daily value (DV) of fat. ?Foods that have 0 grams of trans fat. ?Cooking ?Do not fry your food. ?Cook your food by baking, steaming, grilling, or broiling. These are all methods that do not need a lot of fat for cooking. ?To add flavor, try to use herbs that are low in spice and acidity. ?Meal planning ? ?Choose healthy foods that are low in fat, such as: ?Fruits and vegetables. ?Whole grains. ?Low-fat dairy products. ?Lean meats, fish, and poultry. ?Eat small meals often instead of eating 3 large meals each day. Eat your meals slowly in a place where you are relaxed. Avoid bending over or lying down until 2-3 hours after eating. ?Limit high-fat foods such as fatty meats or fried foods. ?Limit your intake of fatty foods, such as oils, butter, and shortening. ?Avoid the following as told by your doctor: ?Foods that cause symptoms. These may be different for different people. Keep a food diary to keep track of foods that cause symptoms. ?Alcohol. ?Drinking a lot of liquid with meals. ?Eating meals during the 2-3 hours before bed. ?Lifestyle ?Stay at a healthy weight. Ask your doctor what weight is healthy for you. If you need to lose weight, work with your doctor to do so safely. ?Exercise for at least  30 minutes on 5 or more days each week, or as told by your doctor. ?Wear loose-fitting clothes. ?Do not smoke or use any products that contain nicotine or tobacco. If you need help quitting, ask your doctor. ?Sleep with the head of your bed higher than your feet. Use a wedge under the mattress or blocks under the bed frame  to raise the head of the bed. ?Chew sugar-free gum after meals. ?What foods should eat? ? ?Eat a healthy, well-balanced diet of fruits, vegetables, whole grains, low-fat dairy products, lean meats, fish, and poultry. Each person is different. ?Foods that may cause symptoms in one person may not cause any symptoms in another person. Work with your doctor to find foods that are safe for you. ?The items listed above may not be a complete list of what you can eat and drink. Contact a food expert for more options. ?What foods should I avoid? ?Limiting some of these foods may help in managing the symptoms of GERD. Everyone is different. Talk with a food expert or your doctor to help you find the exact foods to avoid, if any. ?Fruits ?Any fruits prepared with added fat. Any fruits that cause symptoms. For some people, this may include citrus fruits, such as oranges, grapefruit, pineapple, and lemons. ?Vegetables ?Deep-fried vegetables. [place] fries. Any vegetables prepared with added fat. Any vegetables that cause symptoms. For some people, this may include tomatoes and tomato products, chili peppers, onions and garlic, and horseradish. ?Grains ?Pastries or quick breads with added fat. ?Meats and other proteins ?High-fat meats, such as fatty beef or pork, hot dogs, ribs, ham, sausage, salami, and bacon. Fried meat or protein, including fried fish and fried chicken. Nuts and nut butters, in large amounts. ?Dairy ?Whole milk and chocolate milk. Sour cream. Cream. Ice cream. Cream cheese. Milkshakes. ?Fats and oils ?Butter. Margarine. Shortening. Ghee. ?Beverages ?Coffee and tea, with or without caffeine. Carbonated beverages. Sodas. Energy drinks. Fruit juice made with acidic fruits, such as orange or grapefruit. Tomato juice. Alcoholic drinks. ?Sweets and desserts ?Chocolate and cocoa. Donuts. ?Seasonings and condiments ?Pepper. Peppermint and spearmint. Added salt. Any condiments, herbs, or seasonings that cause symptoms.  For some people, this may include curry, hot sauce, or vinegar-based salad dressings. ?The items listed above may not be a complete list of what you should not eat and drink. Contact a food expert for more options. ?Questions to ask your doctor ?Diet and lifestyle changes are often the first steps that are taken to manage symptoms of GERD. If diet and lifestyle changes do not help, talk with your doctor about taking medicines. ?Where to find more information ?International Foundation for Gastrointestinal Disorders: aboutgerd.org ?Summary ?When you have GERD, food and lifestyle choices are very important in easing your symptoms. ?Eat small meals often instead of 3 large meals a day. Eat your meals slowly and in a place where you are relaxed. ?Avoid bending over or lying down until 2-3 hours after eating. ?Limit high-fat foods such as fatty meats or fried foods. ?This information is not intended to replace advice given to you by your health care provider. Make sure you discuss any questions you have with your health care provider. ?Document Revised: 02/05/2020 Document Reviewed: 02/05/2020 ?Elsevier Patient Education ? [place]. ? ? ?Gastroesophageal Reflux Disease, Adult ? ?Gastroesophageal reflux (GER) happens when acid from the stomach flows up into the tube that connects the mouth and the stomach (esophagus). Normally, food travels down the esophagus and stays in the stomach  to be digested. With GER, food and stomach acid sometimes move back up into the esophagus. ?You may have a disease called gastroesophageal reflux disease (GERD) if the reflux: ?Happens often. ?Causes frequent or very bad symptoms. ?Causes problems such as damage to the esophagus. ?When this happens, the esophagus becomes sore and swollen. Over time, GERD can make small holes (ulcers) in the lining of the esophagus. ?What are the causes? ?This condition is caused by a problem with the muscle between the esophagus and the stomach. When  this muscle is weak or not normal, it does not close properly to keep food and acid from coming back up from the stomach. ?The muscle can be weak because of: ?Tobacco use. ?Pregnancy. ?Having a certain

## 2021-11-28 NOTE — Chronic Care Management (AMB) (Addendum)
?Chronic Care Management  ? ?CCM RN Visit Note ? ?11/28/2021 ?Name: [name]. MRN: [id number] DOB: [birth year] ? ?Subjective: ?[name]. is a 61 y.o. year old male who is a primary care patient of Colon Branch, MD. The care management team was consulted for assistance with disease management and care coordination needs.   ? ?Engaged with patient by telephone for follow up visit in response to provider referral for case management and/or care coordination services.  ? ?Consent to Services:  ?The patient was given information about Chronic Care Management services, agreed to services, and gave verbal consent prior to initiation of services.  Please see initial visit note for detailed documentation.  ? ?Patient agreed to services and verbal consent obtained.  ? ?Assessment: Review of patient past medical history, allergies, medications, health status, including review of consultants reports, laboratory and other test data, was performed as part of comprehensive evaluation and provision of chronic care management services.  ? ?SDOH (Social Determinants of Health) assessments and interventions performed:   ? ?CCM Care Plan ? ?Allergies  ?Allergen Reactions  ? Bupropion Nausea Only  ? ? ?Outpatient Encounter Medications as of 11/28/2021  ?Medication Sig Note  ? aspirin EC 81 MG EC tablet Take 1 tablet (81 mg total) by mouth daily with breakfast.   ? atorvastatin (LIPITOR) 80 MG tablet TAKE 1 TABLET EVERY DAY AT 6:00PM   ? augmented betamethasone dipropionate (DIPROLENE-AF) 0.05 % cream APPLY TOPICALLY TWICE DAILY   ? B COMPLEX-C ER PO Take 1 tablet by mouth daily.   ? budesonide-formoterol (SYMBICORT) 160-4.5 MCG/ACT inhaler Inhale 2 puffs into the lungs 2 (two) times daily.   ? Cholecalciferol (VITAMIN D) 125 MCG (5000 UT) CAPS Take 1 capsule by mouth daily.   ? clopidogrel (PLAVIX) 75 MG tablet TAKE 1 TABLET EVERY DAY   ? Cyanocobalamin (VITAMIN B-12) 5000 MCG SUBL Place 1 tablet under the tongue daily.    ? ezetimibe (ZETIA) 10 MG tablet Take 1 tablet (10 mg total) by mouth daily.   ? fenofibrate (TRICOR) 145 MG tablet TAKE 1 TABLET EVERY DAY   ? folic acid (FOLVITE) 1 MG tablet Take 1 tablet (1 mg total) by mouth daily.   ? furosemide (LASIX) 20 MG tablet Take 20-40 mg by mouth in the morning.   ? gabapentin (NEURONTIN) 100 MG capsule Take 1 capsule (100 mg total) by mouth 3 (three) times daily.   ? isosorbide mononitrate (IMDUR) 60 MG 24 hr tablet Take 1.5 tablets (90 mg total) by mouth daily.   ? MELATONIN GUMMIES PO Take 5 mg by mouth at bedtime as needed.   ? nitroGLYCERIN (NITROSTAT) 0.4 MG SL tablet Place 1 tablet (0.4 mg total) under the tongue every 5 (five) minutes x 3 doses as needed for chest pain. 05/07/2021: PRN  ? pantoprazole (PROTONIX) 40 MG tablet TAKE 1 TABLET TWICE DAILY   ? spironolactone (ALDACTONE) 25 MG tablet Take 0.5 tablets (12.5 mg total) by mouth daily.   ? tamsulosin (FLOMAX) 0.4 MG CAPS capsule Take 1 capsule (0.4 mg total) by mouth in the morning and at bedtime.   ? thiamine 100 MG tablet Take 1 tablet (100 mg total) by mouth daily.   ? traZODone (DESYREL) 100 MG tablet Take 1 tablet (100 mg total) by mouth at bedtime.   ? valsartan (DIOVAN) 80 MG tablet Take 1 tablet (80 mg total) by mouth daily.   ? ?No facility-administered encounter medications on file as of 11/28/2021.  ? ? ?  Patient Active Problem List  ? Diagnosis Date Noted  ? COPD 0 AB 03/31/2021  ? (HFpEF) heart failure with preserved ejection fraction ([place])   ? DOE (dyspnea on exertion) 11/07/2020  ? Diabetes ([place]) 03/18/2020  ? Prolapsed lumbar disc 01/25/2020  ? Hiatal hernia   ? Hepatic steatosis   ? Hemianopia, homonymous, right   ? ETOH abuse   ? Esophagitis   ? Chronic diastolic CHF (congestive heart failure) ([place])   ? Chronic bronchitis ([place])   ? CAD (coronary artery disease)   ? PAD (peripheral artery disease) ([place]) 09/23/2016  ? Morbid obesity ([place])   ? Chronic pain syndrome 10/23/2015  ? GERD (gastroesophageal  reflux disease) 08/07/2015  ? Pancreatitis, acute   ? PCP NOTES >>>>> 06/01/2015  ? Annual physical exam 05/31/2015  ? Acute ischemic stroke ([place]) 02/20/2015  ? Hx of cocaine abuse ([place]) 02/20/2015  ? BPH (benign prostatic hyperplasia) 02/20/2015  ? OSA (obstructive sleep apnea) ?? 05/05/2013  ? Noncompliance 05/05/2013  ? Anxiety and depression 05/05/2013  ? Erectile dysfunction 03/17/2012  ? Emphysema (subcutaneous) (surgical) resulting from a procedure 10/08/2008  ? Iron deficiency anemia 08/10/2008  ? ABNORMALITY OF GAIT 08/06/2008  ? Cigarette smoker 07/27/2008  ? Dyslipidemia 07/26/2008  ? Essential hypertension 07/26/2008  ? ST elevation myocardial infarction involving left circumflex coronary artery ([place]) 07/26/2008  ? ? ?Conditions to be addressed/monitored:CHF, HTN, HLD, COPD, DMII, and GERD ? ?Care Plan : RN Care Management Plan of Care  ?Updates made by Luretha Rued, RN since 11/28/2021 12:00 AM  ?  ? ?Problem: Chronic Disease Managment Education and/or Care Coordination needs   ?Priority: High  ?  ? ?Long-Range Goal: Development of plan of care for Chronic Disease Management and /or Care Coordination Needs   ?Start Date: 07/01/2021  ?Expected End Date: 02/13/2022  ?Priority: High  ?Note:   ?Current Barriers:  ? ?last office visit with PCP on 11/05/21-reports chest congestion/cough. He states he has been using Robitussion. He states he knows he needs to do better with his health and is interested in smoking cessation. He denies any signs/symptoms of heart failure exacerbation. He states he has been taking his medications, but is out of ezitimibe. He states his sister went online and it said the autorefill was cancelled.   ?11/28/21 [name] reports concern regarding legs achy/numbness, he reports right leg worse than left. He reports a blockage about 5 years ago-stent place in left leg and he is concerned the same thing may be happening to his right leg. He has an appointment scheduled with  Dr. Fletcher Anon  on 12/08/21. He continues to smoke. He is not checking blood sugar and is not currently taking medication. He reports he will check blood sugar if he has a meter. Reports recent episode of urinary urgency/unable to make it to bathroom while in store. He is not checking blood pressure states, he needs to get batteries for his monitor. He reports he would like to start using CPAP machine again, but that he may need a new one. Continues to smoke. His Primary concern today is: reflux, cough, hard to swallow and talk/hoarseness. He states he is not feeling healthy and knows that he has to take better care of his health.  ?Knowledge Deficits related to plan of care for management of CHF, CAD, HTN, COPD, DMII, and GERD  ?Care Coordination needs related to health care management of chronic conditions  ?Chronic Disease Management support and education needs related to CHF, CAD, HTN,  COPD, DMII, and GERD ? ?RNCM Clinical Goal(s):  ?Patient will verbalize basic understanding of CHF, CAD, HTN, COPD, DMII, and GERD disease process and self health management plan as evidenced by self report of taking and chart review of taking medications as prescribed, attending provider visits as scheduled ?demonstrate improved adherence to prescribed treatment plan for CHF, CAD, HTN, COPD, DMII, and GERD as evidenced by self report and/or chart notation of taking medications as precribed, calling provider with questions concerns, attending provider visits. ?work with pharmacist to address Medication procurement related to CHF, CAD, HTN, COPD, DMII, and GERD as evidenced by review of EMR and patient or pharmacist report    ?work with community resource care guide to address needs related to Transportation and Limited access to food as evidenced by patient and/or community resource care guide support    through collaboration with Consulting civil engineer, provider, and care team. Transportation resources discussed with patient by care guide.   ? ?Interventions: ?1:1 collaboration with primary care provider regarding development and update of comprehensive plan of care as evidenced by provider attestation and co-signature ?Inter-disciplinary care team collabo

## 2021-12-01 ENCOUNTER — Ambulatory Visit: Payer: Medicare HMO | Admitting: Physician Assistant

## 2021-12-05 ENCOUNTER — Ambulatory Visit: Payer: Medicare HMO | Admitting: Internal Medicine

## 2021-12-05 ENCOUNTER — Telehealth: Payer: Self-pay | Admitting: Pharmacist

## 2021-12-05 MED ORDER — GABAPENTIN 100 MG PO CAPS: 200.0000 mg | ORAL_CAPSULE | Freq: Three times a day (TID) | ORAL | 0 refills | Status: DC

## 2021-12-05 NOTE — Telephone Encounter (Signed)
-----   Message from Colon Branch, MD sent at 11/30/2021  3:27 PM EDT ----- ?Okay to increase gabapentin to 200 mg 3 times a day. ?I agree that he needs to watch for dizziness and drowsiness. ?Thank you for trying to contact him with cardiology, he definitely needs a follow-up with them. ?He is supposed to come back next month to see me. ?Thank you ?JP ?----- Message ----- ?From: Cherre Robins, RPH-CPP ?Sent: 11/26/2021   5:26 PM EDT ?To: Colon Branch, MD ? ?Leg Pain: He states that he is having more difficulty walking. He isn't sure if its due to neuropathy but patient is concerned that his right leg has a blockage. 09/23/2016 patient had occluded left SFA and Dr Fletcher Anon performed a directional atherectomy of the left SFA followed by drug-coated balloon angioplasty and spot stenting. Dr Fletcher Anon also noted significant proximal right SFA disease with three-vessel runoff below the knee. Right SFA endovascular intervention can be performed if the patient reports good response on the left side but patient may not have followed up with Dr Fletcher Anon. Tried to call cardio office today to either get appointment with Dr Angelena Form or Dr Fletcher Anon. Left message with scheduler with patient's contact information. ?Dr [name] what are your thoughts about titrating gabapentin over the next 2 weeks to 200 to '300mg'$  3 times a day? Will need to monitor for dizziness / drowsiness especially because he does drink alcohol.  ? ? ?

## 2021-12-07 ENCOUNTER — Other Ambulatory Visit: Payer: Self-pay | Admitting: Cardiovascular Disease

## 2021-12-07 DIAGNOSIS — E1151 Type 2 diabetes mellitus with diabetic peripheral angiopathy without gangrene: Secondary | ICD-10-CM | POA: Diagnosis not present

## 2021-12-07 DIAGNOSIS — E785 Hyperlipidemia, unspecified: Secondary | ICD-10-CM

## 2021-12-07 DIAGNOSIS — I110 Hypertensive heart disease with heart failure: Secondary | ICD-10-CM | POA: Diagnosis not present

## 2021-12-07 DIAGNOSIS — J449 Chronic obstructive pulmonary disease, unspecified: Secondary | ICD-10-CM

## 2021-12-07 DIAGNOSIS — N400 Enlarged prostate without lower urinary tract symptoms: Secondary | ICD-10-CM

## 2021-12-09 ENCOUNTER — Ambulatory Visit: Payer: Medicare HMO | Admitting: Cardiovascular Disease

## 2021-12-15 ENCOUNTER — Telehealth: Payer: Self-pay | Admitting: Cardiovascular Disease

## 2021-12-15 ENCOUNTER — Ambulatory Visit (INDEPENDENT_AMBULATORY_CARE_PROVIDER_SITE_OTHER): Payer: Medicare HMO

## 2021-12-15 DIAGNOSIS — I5032 Chronic diastolic (congestive) heart failure: Secondary | ICD-10-CM

## 2021-12-15 DIAGNOSIS — J449 Chronic obstructive pulmonary disease, unspecified: Secondary | ICD-10-CM

## 2021-12-15 NOTE — Telephone Encounter (Signed)
Called patient again and he answered. ?He said his son is coming over to move his O2 into his bedroom. ? ?He has been sleeping in a recliner.  The congestion gets a lot worse at night. ?He wheezes and is coughing a little on the phone. ? ?"I'm afraid to go to the hospital.  They won't give you anything to drink.  My brother in law died there." ? ?We talked about seeking care at an urgent care or again the ER as he may require a CXR or IV diuretics. ? ?He will discuss w his son who is on the way to his house. ?

## 2021-12-15 NOTE — Telephone Encounter (Addendum)
Left message for patient to call back and then called and spoke w his sister Altha [facility]).  She lives in [place].  She told me that the patient has had congestion for the past month and that they have been thinking it is from the pollen but he is getting worse and now taking 3 lasix per day.  I asked her about SOB or wheezing.  She said he always is SOB and always wheezes, but now he is spitting up yellow foamy sputum.   The pt told her 2 nights ago that he is afraid to go to sleep at night because of the symptoms. ? ?She said he probably didn't answer because he is having a hard time talking but she will call him now and see if he answers for her.  As of now the soonest availability in the office would not be for 2-3 days.  ? ?I adv her that the patient needs to be seen and evaluated urgently and so he should go to [facility] ER.  She lives in [place] and she is trying to get a hold of his son.  She said he cannot drive himself in his current condition.  We discussed that he should call EMS to take him.  ?

## 2021-12-15 NOTE — Telephone Encounter (Signed)
? ?  Pt's sister Alyse Low calling, she said, pt called her complaining being very congested and when he cough he spits out yellow foamy discharge. She was ask by the pt to Dr. Angelena Form to get recommendation or prescription. She said to call pt first, if unable to contact pt then to call her. ?

## 2021-12-15 NOTE — Patient Instructions (Signed)
Visit Information ? ?Thank you for taking time to visit with me today. Please don't hesitate to contact me if I can be of assistance to you before our next scheduled telephone appointment. ? ?Following are the goals we discussed today:  ?Patient Goals/Self-Care Activities: ?Contact 911 if symptoms do not improve or worsens ?follow rescue plan if symptoms flare-up ?know when to call the doctor weight gain of 3 pounds overnight, increased swelling in stomach, feet/legs or hands. Increased shortness of breath, having less energy than normal, new or worsening dizziness, if it is harder for you to breathe when lying down and you need to sit up to breathe or you feel that something is not right.  ?Attend provider visits as scheduled ?Continue to monitor for signs/symptoms of COPD worsening: increase cough, shortness of breath or mucus production change for 2 days or more, your number 1 priority is to call your doctor. ?Eat Healthy: variety of foods, fruits, vegetables, lean meats, healthy fats. Avoid processed foods, saturated transfats, minimize salt intake ? ?Our next appointment is by telephone on 12/22/21 at 10:45 am ? ?Please call the care guide team at [phone] if you need to cancel or reschedule your appointment.  ? ?If you are experiencing a Mental Health or [place] or need someone to talk to, please call the Suicide and Crisis Lifeline: 988 ?call 1-800-273-TALK (toll free, 24 hour hotline)  ? ?Patient verbalizes understanding of instructions and care plan provided today and agrees to view in [place]. Active MyChart status confirmed with patient.   ? ?Thea Silversmith, RN, MSN, BSN, CCM ?Care Management Coordinator ?[facility] [place] ?[phone]  ?

## 2021-12-15 NOTE — Chronic Care Management (AMB) (Addendum)
?Chronic Care Management  ? ?CCM RN Visit Note ? ?12/15/2021 ?Name: [name]. MRN: [id number] DOB: [birth year] ? ?Subjective: ?[name]. is a 61 y.o. year old male who is a primary care patient of Kenneth Branch, MD. The care management team was consulted for assistance with disease management and care coordination needs.   ? ?Engaged with patient by telephone for follow up visit in response to provider referral for case management and/or care coordination services.  ? ?Consent to Services:  ?The patient was given information about Chronic Care Management services, agreed to services, and gave verbal consent prior to initiation of services.  Please see initial visit note for detailed documentation.  ? ?Patient agreed to services and verbal consent obtained.  ? ?Assessment: Review of patient past medical history, allergies, medications, health status, including review of consultants reports, laboratory and other test data, was performed as part of comprehensive evaluation and provision of chronic care management services.  ? ?SDOH (Social Determinants of Health) assessments and interventions performed:   ? ?CCM Care Plan ? ?Allergies  ?Allergen Reactions  ? Bupropion Nausea Only  ? ? ?Outpatient Encounter Medications as of 12/15/2021  ?Medication Sig Note  ? aspirin EC 81 MG EC tablet Take 1 tablet (81 mg total) by mouth daily with breakfast.   ? atorvastatin (LIPITOR) 80 MG tablet TAKE 1 TABLET EVERY DAY AT 6:00PM   ? augmented betamethasone dipropionate (DIPROLENE-AF) 0.05 % cream APPLY TOPICALLY TWICE DAILY   ? B COMPLEX-C ER PO Take 1 tablet by mouth daily.   ? budesonide-formoterol (SYMBICORT) 160-4.5 MCG/ACT inhaler Inhale 2 puffs into the lungs 2 (two) times daily.   ? Cholecalciferol (VITAMIN D) 125 MCG (5000 UT) CAPS Take 1 capsule by mouth daily.   ? clopidogrel (PLAVIX) 75 MG tablet TAKE 1 TABLET EVERY DAY   ? Cyanocobalamin (VITAMIN B-12) 5000 MCG SUBL Place 1 tablet under the tongue daily.    ? ezetimibe (ZETIA) 10 MG tablet Take 1 tablet (10 mg total) by mouth daily.   ? fenofibrate (TRICOR) 145 MG tablet TAKE 1 TABLET EVERY DAY   ? folic acid (FOLVITE) 1 MG tablet Take 1 tablet (1 mg total) by mouth daily.   ? furosemide (LASIX) 20 MG tablet Take 20-40 mg by mouth in the morning.   ? gabapentin (NEURONTIN) 100 MG capsule Take 2 capsules (200 mg total) by mouth 3 (three) times daily.   ? isosorbide mononitrate (IMDUR) 60 MG 24 hr tablet Take 1.5 tablets (90 mg total) by mouth daily.   ? MELATONIN GUMMIES PO Take 5 mg by mouth at bedtime as needed.   ? nitroGLYCERIN (NITROSTAT) 0.4 MG SL tablet Place 1 tablet (0.4 mg total) under the tongue every 5 (five) minutes x 3 doses as needed for chest pain. 05/07/2021: PRN  ? pantoprazole (PROTONIX) 40 MG tablet TAKE 1 TABLET TWICE DAILY   ? spironolactone (ALDACTONE) 25 MG tablet Take 0.5 tablets (12.5 mg total) by mouth daily.   ? tamsulosin (FLOMAX) 0.4 MG CAPS capsule Take 1 capsule (0.4 mg total) by mouth in the morning and at bedtime.   ? thiamine 100 MG tablet Take 1 tablet (100 mg total) by mouth daily.   ? traZODone (DESYREL) 100 MG tablet Take 1 tablet (100 mg total) by mouth at bedtime.   ? valsartan (DIOVAN) 80 MG tablet Take 1 tablet (80 mg total) by mouth daily.   ? ?No facility-administered encounter medications on file as of 12/15/2021.  ? ? ?  Patient Active Problem List  ? Diagnosis Date Noted  ? COPD 0 AB 03/31/2021  ? (HFpEF) heart failure with preserved ejection fraction ([place])   ? DOE (dyspnea on exertion) 11/07/2020  ? Diabetes ([place]) 03/18/2020  ? Prolapsed lumbar disc 01/25/2020  ? Hiatal hernia   ? Hepatic steatosis   ? Hemianopia, homonymous, right   ? ETOH abuse   ? Esophagitis   ? Chronic diastolic CHF (congestive heart failure) ([place])   ? Chronic bronchitis ([name])   ? CAD (coronary artery disease)   ? PAD (peripheral artery disease) ([place]) 09/23/2016  ? Morbid obesity ([place])   ? Chronic pain syndrome 10/23/2015  ? GERD (gastroesophageal reflux  disease) 08/07/2015  ? Pancreatitis, acute   ? PCP NOTES >>>>> 06/01/2015  ? Annual physical exam 05/31/2015  ? Acute ischemic stroke ([place]) 02/20/2015  ? Hx of cocaine abuse ([place]) 02/20/2015  ? BPH (benign prostatic hyperplasia) 02/20/2015  ? OSA (obstructive sleep apnea) ?? 05/05/2013  ? Noncompliance 05/05/2013  ? Anxiety and depression 05/05/2013  ? Erectile dysfunction 03/17/2012  ? Emphysema (subcutaneous) (surgical) resulting from a procedure 10/08/2008  ? Iron deficiency anemia 08/10/2008  ? ABNORMALITY OF GAIT 08/06/2008  ? Cigarette smoker 07/27/2008  ? Dyslipidemia 07/26/2008  ? Essential hypertension 07/26/2008  ? ST elevation myocardial infarction involving left circumflex coronary artery ([place]) 07/26/2008  ? ? ?Conditions to be addressed/monitored:CHF and COPD ? ?Care Plan : RN Care Management Plan of Care  ?Updates made by Luretha Rued, RN since 12/15/2021 12:00 AM  ?  ? ?Problem: Chronic Disease Managment Education and/or Care Coordination needs   ?Priority: High  ?  ? ?Long-Range Goal: Development of plan of care for Chronic Disease Management and /or Care Coordination Needs   ?Start Date: 07/01/2021  ?Expected End Date: 02/13/2022  ?Priority: High  ?Note:   ?Current Barriers: ?Missed appointments:  Richardson Dopp, PA-C on 12/01/21 and Dr. Fletcher Anon on 12/09/21. ?11/11/21 last office visit with PCP on 11/05/21-reports chest congestion/cough. He states he has been using Robitussion. He states he knows he needs to do better with his health and is interested in smoking cessation. He denies any signs/symptoms of heart failure exacerbation. He states he has been taking his medications, but is out of ezitimibe. He states his sister went online and it said the autorefill was cancelled.   ?11/28/21 [name] reports concern regarding legs achy/numbness, he reports right leg worse than left. He reports a blockage about 5 years ago-stent place in left leg and he is concerned the same thing may be happening to his right  leg. He has an appointment scheduled with  Dr. Fletcher Anon on 12/08/21. He continues to smoke. He is not checking blood sugar and is not currently taking medication. He reports he will check blood sugar if he has a meter. Reports recent episode of urinary urgency/unable to make it to bathroom while in store. He is not checking blood pressure states, he needs to get batteries for his monitor. He reports he would like to start using CPAP machine again, but that he may need a new one. Continues to smoke. His Primary concern today is: reflux, cough, hard to swallow and talk/hoarseness. He states he is not feeling healthy and knows that he has to take better care of his health.  ?12/15/21 Mr. Adami reports he is having "a bad time with this fluid on my heart. He reports he feels like he is "drowning" and reports, "I am just so congested", he reports SOB.  He states it is hard to sleep at night due to waking up SOB. He reports he is taking furosemide one tablet about 6am, one at 9am and one about 12 noon. He states he has not checking weights consistently. Does not know what his weights are. Reports cough and states when he does cough up something it is "like yellow foam". He states he is urinating. Per chart, patient's sister called today and left message regarding patient's SOB, cough and requested return call from cardiologist, Dr. Angelena Form, to patient and/or sister if unable to reach patient. Mr. Fitzwater reports he is awaiting a call from cardiology office. He states he is using his inhaler as prescribed. RNCM encouraged Mr. Ouch to call 911 if he does not improve or worsens.  ?Knowledge Deficits related to plan of care for management of CHF, CAD, HTN, COPD, DMII, and GERD  ?Care Coordination needs related to health care management of chronic conditions  ?Chronic Disease Management support and education needs related to CHF, CAD, HTN, COPD, DMII, and GERD ? ?RNCM Clinical Goal(s):  ?Patient will verbalize basic understanding  of CHF, CAD, HTN, COPD, DMII, and GERD disease process and self health management plan as evidenced by self report of taking and chart review of taking medications as prescribed, attending provider visit

## 2021-12-16 NOTE — Telephone Encounter (Signed)
Called and left message for patient to call back.  I don't see where he came to the hospital or urgent care yesterday. He needs an appointment if he did not get seen yesterday. ?

## 2021-12-19 ENCOUNTER — Ambulatory Visit: Payer: Medicare HMO | Admitting: Pharmacist

## 2021-12-19 DIAGNOSIS — I10 Essential (primary) hypertension: Secondary | ICD-10-CM

## 2021-12-19 DIAGNOSIS — I739 Peripheral vascular disease, unspecified: Secondary | ICD-10-CM

## 2021-12-19 DIAGNOSIS — E1151 Type 2 diabetes mellitus with diabetic peripheral angiopathy without gangrene: Secondary | ICD-10-CM

## 2021-12-19 DIAGNOSIS — E785 Hyperlipidemia, unspecified: Secondary | ICD-10-CM

## 2021-12-19 DIAGNOSIS — I5032 Chronic diastolic (congestive) heart failure: Secondary | ICD-10-CM

## 2021-12-19 MED ORDER — ONETOUCH DELICA LANCETS 30G MISC
4 refills | Status: DC
Start: 2021-12-19 — End: 2022-08-28

## 2021-12-19 MED ORDER — ONETOUCH VERIO REFLECT W/DEVICE KIT: PACK | 0 refills | Status: DC

## 2021-12-19 MED ORDER — ONETOUCH VERIO VI STRP: ORAL_STRIP | 4 refills | Status: DC

## 2021-12-19 NOTE — Patient Instructions (Signed)
Mr. Leonhardt ?It was a pleasure speaking with you  ?Below is a summary of your health goals and care plan ? ? ?Patient Goals/Self-Care Activities ?take medications as prescribed,  ?focus on medication adherence by utilizing mail order pharmacy and contacting clinical pharmacist [name] [place] [phone] or [phone]) about any medications questions or concerns,  ?check blood pressure 2 to 3 times per week, document, and provide at future appointments,  ?Start checking blood glucose once a day.  ?Home blood glucose goals  ?Fasting blood glucose goal (before meals) = 80 to 130 ?Blood glucose goal after a meal = less than 180  ?weigh daily, and contact provider if weight gain of more than 3 lbs in 24 hours or 5 lbs in 1 week  ?Contact Dr Newsome's office - [facility] regarding urinary frequency. ? ? ?If you have any questions or concerns, please feel free to contact me either at the phone number below or with a MyChart message.  ? ?Keep up the good work! ? ?Cherre Robins, PharmD ?Clinical Pharmacist ?[facility] ?[place] ?[phone] (direct line)  ?[phone] (main office number) ? ? ?Current Barriers:  ?Unable to independently monitor therapeutic efficacy ?Unable to achieve control of Hyperlipidemia / high cholesterol ?Unable to maintain control of blood pressure and heart disease ?Does not adhere to prescribed medication regimen (improved)  ?Does not contact provider office for questions/concerns ? ?Pharmacist Clinical Goal(s):  ?Over the next 90 days, patient will verbalize ability to afford treatment regimen ?achieve adherence to monitoring guidelines and medication adherence to achieve therapeutic efficacy ?achieve control of Hyperlipidemia as evidenced by LDL <70 ?maintain control of Heart disease, blood pressure and type 2 diabetes as evidenced by attaining goals listed below  ?adhere to prescribed medication regimen as evidenced by refill history ?contact provider office  for questions/concerns as evidenced notation of same in electronic health record through collaboration with PharmD and provider.  ? ?Interventions: ?1:1 collaboration with Colon Branch, MD regarding development and update of comprehensive plan of care as evidenced by provider attestation and co-signature ?Inter-disciplinary care team collaboration (see longitudinal plan of care) ?Comprehensive medication review performed; medication list updated in electronic medical record ? ?Diabetes: ?Goal A1c < 6.5% ?Lab Results  ?Component Value Date  ? HGBA1C 6.8 (H) 11/05/2021  ? ?Current treatment:  None ?Interventions:   ?Counseled on A1c goal ?Start checking blood glucose daily - prescription for glucometer and supplies sent to pharmacy.  ?Reviewed home blood goals  ?Fasting blood glucose goal (before meals) = 80 to 130 ?Blood glucose goal after a meal = less than 180  ?Recommend recheck A1c with next labs. If >6.5% consider starting medication therapy ?Continue to limit intake of bread and sweets ? ? ?Hypertension / Heart Disease:  ?Goal: decreased symptoms of heart disease, prevent exacerbations and BP <140/90.  ?Current treatment: ?Furosemide '20mg'$  - take 3 tablets = '60mg'$  daily in the morning ?Spironolactone '25mg'$  - take 0.5 tablet = 12.'5mg'$  each morning ?Valsartan '80mg'$  daily ?Interventions:   ?Reviewed blood pressure goal ?Continue to check blood pressure 2 to 3 times a week, record and bring to future appointments. ?Discussed signs and symptoms of worsening heart disease ?weight gain, shortness of breath, abdominal fullness, swelling in legs or abdomen, Fatigue and weakness, changes in ability to perform usual activities, persistent cough or wheezing with white or pink blood-tinged mucus, nausea and lack of appetite. Contact cardiologist or primary care physician if you experience any of these symptoms.  ?Continue to check weight  every day. Report weight gain of more than 3 lbs in 24 hours or 5 lbs in 1 week.   ? ?Hyperlipidemia with elevated triglycerides / history of stroke: ?LDL goal <55; Triglycerides goal <150 ?Lab Results  ?Component Value Date  ? [place] 71 05/07/2021  ? ?Current treatment: ?Atorvastatin '80mg'$  daily ?Ezetimibe '10mg'$  daily  ?Fenofibrate '145mg'$  daily ?Aspirin '81mg'$  daily with breakfast (has not taken recently ?Clopidogrel '75mg'$  daily ?Isosorbide ER '60mg'$  0 take 1.5 tablets = '90mg'$  daily ?Nitroglycerin 0.'4mg'$  - place 1 tablet under the tongue and allow to dissolve as needed for chest pain ?Interventions:  ?Education provided about LDL and triglyceride goal ?Discussed benefits of getting LDL to goal and taking atorvastatin daily for worsening of heart disease / stroke ?Recommend recheck lipid panel with next labs.  ?Continue current medications listed above for heart health / cholesterol lowering / stroke prevention ? ? ?Chronic Obstructive Pulmonary Disease / sleep apnea: ?Current treatment: ?CPAP ?Symbicort (budesonide/formoterol) 160/4.5 - inhale 2 puffs into lungs twice a day ?Interventions:  ?Continue to follow up with pulmonologist  ?Use Symbicort - 2 puffs twice a day ?Remember to rinse mouth after using Symbicort.  ?Discontinue smoking or at least decrease number of cigarettes smoked per day.  ? ?Medication Management:  ?Goal: take medications as prescribed; consult clinical pharmacist if any questions or issues with medication access ?Current Pharmacy:  ?locally uses [facility].  ?[facility] ?Interventions:  ?Continue using either color stickers or color pill containers to help patient identify morning, afternoon, evening and night medications ?Reviewed medication adherence.   ? ? ?Follow Up Plan: Telephone follow up appointment with care management team member scheduled for:  12/25/2021 - Nurse case manager; 3 week Clinical Pharmacist Practitioner ? ?Patient verbalizes understanding of instructions and care plan provided today and agrees to view in [place]. Active MyChart status  confirmed with patient.    ?

## 2021-12-19 NOTE — Chronic Care Management (AMB) (Signed)
// ? ? ?Chronic Care Management ?Pharmacy Note ? ?12/19/2021 ?Name:  [name]. MRN:  [id number] DOB:  06/04/1961 ? ?Summary:   ?Leg Pain: Patient has not increased gabapentin yet - instructed him that Dr [name] OK'd increase dose of gabapentin to 281m 3 times a day.  ?Patient will see Dr AFletcher Anonto follow up right SFA disease to evaluate for possible intervention on January 13, 2022. ?CHF: Patient was c/o increase shortness of breath especially at night on 12/15/2021. Cardiology office has reached out to him and recommended he present to ED or urgent care. Patient has not sought medical care yet. He is checking weight regularly and reports that weight this week has been 234 to 237lbs (today's weight 235 lbs) He is taking furosemide 235m3 times a day. Continues to have shortness of breath at night and is unable to lie down to sleep. [facility] Reviewed signs of worsening CHF. I encouraged him to [facility]. ?Diabetes: Last A1c had increased to 6.8%. Patient was unable to tolerate metformin - even 25026maily caused GI symptoms. With unstable CHF would be hesitant to start metformin currently. He might be a good candidate for SGLT2 but would like to check renal funciton prior to starting. Patient unable to come to office for labs currently. Will consider SGLT2 after next labs. Chronic Care Management nurse case manager has asked about Continuous Glucose Monitor for patient but since he is not taking any medications for diabetes he is not eligible for Continuous Glucose Monitor with [employer]. Patient is agreeable to finger stick type glucometer. Rx for glucometer and supplies sent to [facility].  ? ?Subjective: ?[name] an [age]o. year old male who is a primary patient of [name].  The CCM team was consulted for assistance with disease management and care coordination needs.   ? ?Collaboration with patient  for follow up visit in response  to provider referral for pharmacy case management and/or care coordination services.  ? ?Consent to Services:  ?The patient was given information about Chronic Care Management services, agreed to services, and gave verbal consent prior to initiation of services.  Please see initial visit note for detailed documentation.  ? ?Patient Care Team: ?PazColon BranchD as PCP - General (Internal Medicine) ?McABurnell BlanksD as PCP - Cardiology (Cardiology) ?HolRalene BatheD as Consulting Physician (Ophthalmology) ?Pyrtle, JayLajuan LinesD as Consulting Physician (Gastroenterology) ?BeaSusa DayD as Consulting Physician (Orthopedic Surgery) ?WalLuretha RuedN as Case Manager ?EckCherre RobinsPH-CPP (Pharmacist) ?NewRozetta NunneryD (Inactive) as Consulting Physician (Otolaryngology) ? ? ?Objective: ? ?Lab Results  ?Component Value Date  ? CREATININE 1.08 11/05/2021  ? CREATININE 1.61 (H) 09/05/2021  ? CREATININE 1.02 04/11/2021  ? ? ?Lab Results  ?Component Value Date  ? HGBA1C 6.8 (H) 11/05/2021  ? ?Last diabetic Eye exam:  ?Lab Results  ?Component Value Date/Time  ? HMDIABEYEEXA No Retinopathy 06/30/2021 12:00 AM  ?  ?Last diabetic Foot exam: No results found for: HMDIABFOOTEX  ? ?   ?Component Value Date/Time  ? CHOL 141 05/07/2021 1347  ? CHOL 171 11/14/2019 1441  ? TRIG 142.0 05/07/2021 1347  ? HDL 41.70 05/07/2021 1347  ? HDL 37 (L) 11/14/2019 1441  ? CHOLHDL 3 05/07/2021 1347  ? VLDL 28.4 05/07/2021 1347  ? LDLPrinc[place] 05/07/2021 1347  ? LDL[place]6 (H) 11/14/2019 1441  ? LDLDIRECT  116.0 05/12/2013 1517  ? ? ? ?  Latest Ref Rng & Units 04/11/2021  ? 10:56 AM 07/10/2020  ?  2:45 PM 11/26/2018  ? 12:22 PM  ?Hepatic Function  ?Total Protein 6.5 - 8.1 g/dL 7.9   7.1   7.8    ?Albumin 3.5 - 5.0 g/dL 4.3   4.0   3.9    ?AST 15 - 41 U/L '18   12   22    ' ?ALT 0 - 44 U/L '15   13   19    ' ?Alk Phosphatase 38 - 126 U/L 66   135   88    ?Total Bilirubin 0.3 - 1.2 mg/dL 1.5   1.2   2.1    ? ? ?Lab Results  ?Component  Value Date/Time  ? TSH 1.895 07/07/2016 06:31 PM  ? TSH 1.88 05/05/2013 12:14 PM  ? TSH 2.290 **Test methodology is 3rd generation TSH** 06/23/2009 07:55 AM  ? ? ? ?  Latest Ref Rng & Units 09/05/2021  ?  2:57 PM 04/11/2021  ? 10:56 AM 12/27/2019  ?  3:55 PM  ?CBC  ?WBC 3.8 - 10.8 Thousand/uL 9.1   11.0   7.4    ?Hemoglobin 13.2 - 17.1 g/dL 16.2   15.2   15.5    ?Hematocrit 38.5 - 50.0 % 47.8   45.9   47.6    ?Platelets 140 - 400 Thousand/uL 209   211   221.0    ? ? ?Lab Results  ?Component Value Date/Time  ? VD25OH 39.28 11/05/2021 04:04 PM  ? VD25OH 10.8 (L) 11/03/2016 02:53 PM  ? ? ?Clinical ASCVD: Yes  ?The ASCVD Risk score (Arnett DK, et al., 2019) failed to calculate for the following reasons: ?  The patient has a prior MI or stroke diagnosis   ? ? ?Social History  ? ?Tobacco Use  ?Smoking Status Every Day  ? Packs/day: 0.50  ? Years: 45.00  ? Pack years: 22.50  ? Types: Cigarettes  ? Start date: 7  ?Smokeless Tobacco Never  ?Tobacco Comments  ? 1 pack daily-03/10/21  ? ?BP Readings from Last 3 Encounters:  ?11/05/21 136/82  ?09/15/21 116/78  ?09/05/21 122/68  ? ?Pulse Readings from Last 3 Encounters:  ?11/05/21 91  ?09/15/21 (!) 52  ?09/05/21 (!) 56  ? ?Wt Readings from Last 3 Encounters:  ?11/05/21 236 lb 2 oz (107.1 kg)  ?09/15/21 229 lb 3.2 oz (104 kg)  ?09/05/21 224 lb 4 oz (101.7 kg)  ? ? ?Assessment: Review of patient past medical history, allergies, medications, health status, including review of consultants reports, laboratory and other test data, was performed as part of comprehensive evaluation and provision of chronic care management services.  ? ?SDOH:  (Social Determinants of Health) assessments and interventions performed:  ? ? ? ? ? ?[place] ? ?Allergies  ?Allergen Reactions  ? Bupropion Nausea Only  ? ? ?Medications Reviewed Today   ? ? Reviewed by Cherre Robins, RPH-CPP (Pharmacist) on 12/19/21 at 56  Med List Status: <None>  ? ?Medication Order Taking? Sig Documenting Provider Last  Dose Status Informant  ?aspirin EC 81 MG EC tablet [id number] Yes Take 1 tablet (81 mg total) by mouth daily with breakfast. Roxan Hockey, MD Taking Active   ?atorvastatin (LIPITOR) 80 MG tablet [id number] Yes TAKE 1 TABLET EVERY DAY AT 6:00PM Burnell Blanks, MD Taking Active   ?augmented betamethasone dipropionate (DIPROLENE-AF) 0.05 % cream [id number] Yes Hampton, [place],  MD Taking Active   ?B COMPLEX-C ER PO [id number] Yes Take 1 tablet by mouth daily. [provider] Taking Active   ?budesonide-formoterol (SYMBICORT) 160-4.5 MCG/ACT inhaler [id number] Yes Inhale 2 puffs into the lungs 2 (two) times daily. Colon Branch, MD Taking Active   ?Cholecalciferol (VITAMIN D) 125 MCG (5000 UT) CAPS [id number] Yes Take 1 capsule by mouth daily. [provider] Taking Active   ?clopidogrel (PLAVIX) 75 MG tablet [id number] Yes TAKE 1 TABLET EVERY DAY Burnell Blanks, MD Taking Active   ?Cyanocobalamin (VITAMIN B-12) 5000 MCG SUBL [id number] Yes Place 1 tablet under the tongue daily. [provider] Taking Active   ?ezetimibe (ZETIA) 10 MG tablet [id number] Yes Take 1 tablet (10 mg total) by mouth daily. Burnell Blanks, MD Taking Active   ?fenofibrate (TRICOR) 145 MG tablet [id number] Yes TAKE 1 TABLET EVERY DAY Colon Branch, MD Taking Active   ?folic acid (FOLVITE) 1 MG tablet [id number] Yes Take 1 tablet (1 mg total) by mouth daily. Roxan Hockey, MD Taking Active   ?furosemide (LASIX) 20 MG tablet [id number] Yes Take 20 mg by mouth 3 (three) times daily. Colon Branch, MD Taking Active   ?gabapentin (NEURONTIN) 100 MG capsule [id number] Yes Take 2 capsules (200 mg total) by mouth 3 (three) times daily. Colon Branch, MD Taking Active   ?isosorbide mononitrate (IMDUR) 60 MG 24 hr tablet [id number] Yes Take 1.5 tablets (90 mg total) by mouth daily. Burnell Blanks, MD Taking Active   ?MELATONIN GUMMIES PO [id number] Yes Take 5 mg by mouth at bedtime as  needed. [provider] Taking Active   ?nitroGLYCERIN (NITROSTAT) 0.4 MG SL tablet [id number] Yes Place 1 tablet (0.4 mg total) under the tongue every 5 (five) minutes x 3 doses as needed for ch

## 2021-12-22 ENCOUNTER — Telehealth: Payer: Self-pay | Admitting: Cardiovascular Disease

## 2021-12-22 ENCOUNTER — Ambulatory Visit: Payer: Medicare HMO

## 2021-12-22 DIAGNOSIS — I10 Essential (primary) hypertension: Secondary | ICD-10-CM

## 2021-12-22 DIAGNOSIS — I5032 Chronic diastolic (congestive) heart failure: Secondary | ICD-10-CM

## 2021-12-22 NOTE — Telephone Encounter (Signed)
Pt calling to say he is going to the ER tomorrow morning when he has a ride.  I adv him it is always ok to call EMS. ?He said his symptoms eased at the end of the week and but worsening again.  He is raspy and at times hardly has a voice.  His chest feels heavy.  He is congested.  No cough.  We discussed that the ER will be able to evaluate him appropriately and timely and that if he gets any worse at all between tonight and tomorrow he should call EMS right away. ?

## 2021-12-22 NOTE — Patient Instructions (Signed)
Visit Information ? ?Thank you for taking time to visit with me today. Please don't hesitate to contact me if I can be of assistance to you before our next scheduled telephone appointment. ? ?Following are the goals we discussed today:  ?Patient Goals/Self-Care Activities: ?follow rescue plan if symptoms flare-up ?know when to call the doctor weight gain of 3 pounds overnight, increased swelling in stomach, feet/legs or hands. Increased shortness of breath, having less energy than normal, new or worsening dizziness, if it is harder for you to breathe when lying down and you need to sit up to breathe or you feel that something is not right.  ?Call to Schedule a follow up visit with cardiologist ?Continue to monitor for signs/symptoms of COPD worsening: increase cough, shortness of breath or mucus production change for 2 days or more, your number 1 priority is to call your doctor. ?Eat Healthy: variety of foods, fruits, vegetables, lean meats, healthy fats. Avoid processed foods, saturated transfats, minimize salt intake ? ?Our next appointment is by telephone on 02/02/22 at 1:00 pm ? ?Please call the care guide team at [phone] if you need to cancel or reschedule your appointment.  ? ?If you are experiencing a Mental Health or [place] or need someone to talk to, please call the Suicide and Crisis Lifeline: 988 ?call 1-800-273-TALK (toll free, 24 hour hotline)  ? ?Patient verbalizes understanding of instructions and care plan provided today and agrees to view in [facility]. Active MyChart status confirmed with patient.   ? ?Thea Silversmith, RN, MSN, BSN, CCM ?Care Management Coordinator ?[facility] ?[phone]  ?

## 2021-12-22 NOTE — Telephone Encounter (Signed)
?  Per MyChart scheduling message: ? ?Pt c/o swelling: STAT is pt has developed SOB within 24 hours ? ?If swelling, where is the swelling located?  ? ?How much weight have you gained and in what time span?  ? ?Have you gained 3 pounds in a day or 5 pounds in a week?  ? ?Do you have a log of your daily weights (if so, list)?  ? ?Are you currently taking a fluid pill?  ? ?Are you currently SOB?  ? ?Have you traveled recently?  ? ? ? 1. Stomach mainly ?2. Haven't been keeping up with it but estimate 3-5 lbs gained in a week. ?4. No log ?5. Taking two '20mg'$  lasix per day. ?6. Yes, short of breath. ?7. No ?

## 2021-12-22 NOTE — Chronic Care Management (AMB) (Addendum)
?Chronic Care Management  ? ?CCM RN Visit Note ? ?12/22/2021 ?Name: [name]. MRN: [id number] DOB: 1961-04-17 ? ?Subjective: ?[name]. is a 61 y.o. year old male who is a primary care patient of Colon Branch, MD. The care management team was consulted for assistance with disease management and care coordination needs.   ? ?Engaged with patient by telephone for follow up visit in response to provider referral for case management and/or care coordination services.  ? ?Consent to Services:  ?The patient was given information about Chronic Care Management services, agreed to services, and gave verbal consent prior to initiation of services.  Please see initial visit note for detailed documentation.  ? ?Patient agreed to services and verbal consent obtained.  ? ?Assessment: Review of patient past medical history, allergies, medications, health status, including review of consultants reports, laboratory and other test data, was performed as part of comprehensive evaluation and provision of chronic care management services.  ? ?SDOH (Social Determinants of Health) assessments and interventions performed:   ? ?CCM Care Plan ? ?Allergies  ?Allergen Reactions  ? Bupropion Nausea Only  ? ? ?Outpatient Encounter Medications as of 12/22/2021  ?Medication Sig Note  ? aspirin EC 81 MG EC tablet Take 1 tablet (81 mg total) by mouth daily with breakfast.   ? atorvastatin (LIPITOR) 80 MG tablet TAKE 1 TABLET EVERY DAY AT 6:00PM   ? augmented betamethasone dipropionate (DIPROLENE-AF) 0.05 % cream APPLY TOPICALLY TWICE DAILY   ? B COMPLEX-C ER PO Take 1 tablet by mouth daily.   ? budesonide-formoterol (SYMBICORT) 160-4.5 MCG/ACT inhaler Inhale 2 puffs into the lungs 2 (two) times daily.   ? Cholecalciferol (VITAMIN D) 125 MCG (5000 UT) CAPS Take 1 capsule by mouth daily.   ? clopidogrel (PLAVIX) 75 MG tablet TAKE 1 TABLET EVERY DAY   ? Cyanocobalamin (VITAMIN B-12) 5000 MCG SUBL Place 1 tablet under the tongue daily.    ? ezetimibe (ZETIA) 10 MG tablet Take 1 tablet (10 mg total) by mouth daily.   ? fenofibrate (TRICOR) 145 MG tablet TAKE 1 TABLET EVERY DAY   ? folic acid (FOLVITE) 1 MG tablet Take 1 tablet (1 mg total) by mouth daily.   ? furosemide (LASIX) 20 MG tablet Take 20 mg by mouth 3 (three) times daily. 12/22/2021: Reports takes two tablets in the morning and one tablet in the afternoon.  ? isosorbide mononitrate (IMDUR) 60 MG 24 hr tablet Take 1.5 tablets (90 mg total) by mouth daily.   ? MELATONIN GUMMIES PO Take 5 mg by mouth at bedtime as needed.   ? nitroGLYCERIN (NITROSTAT) 0.4 MG SL tablet Place 1 tablet (0.4 mg total) under the tongue every 5 (five) minutes x 3 doses as needed for chest pain. 05/07/2021: PRN  ? pantoprazole (PROTONIX) 40 MG tablet TAKE 1 TABLET TWICE DAILY   ? spironolactone (ALDACTONE) 25 MG tablet Take 0.5 tablets (12.5 mg total) by mouth daily.   ? tamsulosin (FLOMAX) 0.4 MG CAPS capsule Take 1 capsule (0.4 mg total) by mouth in the morning and at bedtime.   ? thiamine 100 MG tablet Take 1 tablet (100 mg total) by mouth daily.   ? traZODone (DESYREL) 100 MG tablet Take 1 tablet (100 mg total) by mouth at bedtime.   ? valsartan (DIOVAN) 80 MG tablet Take 1 tablet (80 mg total) by mouth daily.   ? Blood Glucose Monitoring Suppl (ONETOUCH VERIO REFLECT) w/Device KIT Use to check blood glucose daily (DX:  type 2 DM - E 11.51)   ? gabapentin (NEURONTIN) 100 MG capsule Take 2 capsules (200 mg total) by mouth 3 (three) times daily. 12/19/2021: Patient has not increased dose yet.   ? glucose blood (ONETOUCH VERIO) test strip Use to check blood glucose once a day (DX: 11.51 - type 2 DM)   ? OneTouch Delica Lancets 37C MISC Use to check blood glucose once a day (DX: E11.51 type 2 DM)   ? ?No facility-administered encounter medications on file as of 12/22/2021.  ? ? ?Patient Active Problem List  ? Diagnosis Date Noted  ? COPD 0 AB 03/31/2021  ? (HFpEF) heart failure with preserved ejection fraction ([place])   ?  DOE (dyspnea on exertion) 11/07/2020  ? Diabetes ([place]) 03/18/2020  ? Prolapsed lumbar disc 01/25/2020  ? Hiatal hernia   ? Hepatic steatosis   ? Hemianopia, homonymous, right   ? ETOH abuse   ? Esophagitis   ? Chronic diastolic CHF (congestive heart failure) ([place])   ? Chronic bronchitis ([place])   ? CAD (coronary artery disease)   ? PAD (peripheral artery disease) ([place]) 09/23/2016  ? Morbid obesity ([place])   ? Chronic pain syndrome 10/23/2015  ? GERD (gastroesophageal reflux disease) 08/07/2015  ? Pancreatitis, acute   ? PCP NOTES >>>>> 06/01/2015  ? Annual physical exam 05/31/2015  ? Acute ischemic stroke ([place]) 02/20/2015  ? Hx of cocaine abuse ([place]) 02/20/2015  ? BPH (benign prostatic hyperplasia) 02/20/2015  ? OSA (obstructive sleep apnea) ?? 05/05/2013  ? Noncompliance 05/05/2013  ? Anxiety and depression 05/05/2013  ? Erectile dysfunction 03/17/2012  ? Emphysema (subcutaneous) (surgical) resulting from a procedure 10/08/2008  ? Iron deficiency anemia 08/10/2008  ? ABNORMALITY OF GAIT 08/06/2008  ? Cigarette smoker 07/27/2008  ? Dyslipidemia 07/26/2008  ? Essential hypertension 07/26/2008  ? ST elevation myocardial infarction involving left circumflex coronary artery ([place]) 07/26/2008  ? ? ?Conditions to be addressed/monitored:CHF, CAD, HTN, COPD, and DMII ? ?Care Plan : RN Care Management Plan of Care  ?Updates made by Luretha Rued, RN since 12/22/2021 12:00 AM  ?  ? ?Problem: Chronic Disease Managment Education and/or Care Coordination needs   ?Priority: High  ?  ? ?Long-Range Goal: Development of plan of care for Chronic Disease Management and /or Care Coordination Needs   ?Start Date: 07/01/2021  ?Expected End Date: 02/13/2022  ?Priority: High  ?Note:   ?Current Barriers: ?Missed appointments:  Richardson Dopp, PA-C on 12/01/21(needs cardiology follow up) and Dr. Fletcher Anon on 12/09/21 (follow up scheduled 01/13/22). ?12/15/21 Mr. Castoro reports he is having "a bad time with this fluid on my heart. He reports he feels like he  is "drowning" and reports, "I am just so congested", he reports SOB. He states it is hard to sleep at night due to waking up SOB. He reports he is taking furosemide one tablet about 6am, one at 9am and one about 12 noon. He states he has not checking weights consistently. Does not know what his weights are. Reports cough and states when he does cough up something it is "like yellow foam". He states he is urinating. Per chart, patient's sister called today and left message regarding patient's SOB, cough and requested return call from cardiologist, Dr. Angelena Form, to patient and/or sister if unable to reach patient. Mr. Amezcua reports he is awaiting a call from cardiology office. He states he is using his inhaler as prescribed. RNCM encouraged Mr. Mantel to call [phone] if he does not improve or worsens.  ?  12/22/21 Patient with reports of increased SOB, cough, sensation of drowning. Recommendation per cardiologist office on 12/15/21 was to go to the ED. Mr. Popper seek medication care. Today Mr. Tapp reports, "I am doing better. I can breathe". He reports his weight is maintained at 235 pounds. Reports taking 40 mg furosemide in the morning and 20 mg in the afternoon. He denies SOB and states he only has an occasional cough and is not bringing up any mucous. He denies edema. He states his is sleeping in the bed and using only one pillow. Per patient, BP on yesterday 121/59 HR 71. He continues to smoke, but states he is trying to decrease the amount.  RNCM discussed the need for patient to have a follow up visit with cardiologist. Mr. Langenbach states he is going to have his sister call and schedule an appointment. ?Knowledge Deficits related to plan of care for management of CHF, CAD, HTN, COPD, DMII, and GERD  ?Care Coordination needs related to health care management of chronic conditions  ?Chronic Disease Management support and education needs related to CHF, CAD, HTN, COPD, DMII, and GERD ? ?RNCM Clinical Goal(s):  ?Patient  will verbalize basic understanding of CHF, CAD, HTN, COPD, DMII, and GERD disease process and self health management plan as evidenced by self report of taking and chart review of taking medications

## 2021-12-23 ENCOUNTER — Emergency Department (HOSPITAL_COMMUNITY): Payer: Medicare HMO

## 2021-12-23 ENCOUNTER — Inpatient Hospital Stay (HOSPITAL_COMMUNITY)
Admission: EM | Admit: 2021-12-23 | Discharge: 2021-12-26 | DRG: 291 | Disposition: A | Discharge status: Home Health | Payer: Medicare HMO | Attending: Internal Medicine | Admitting: Internal Medicine

## 2021-12-23 ENCOUNTER — Other Ambulatory Visit: Payer: Self-pay

## 2021-12-23 ENCOUNTER — Encounter (HOSPITAL_COMMUNITY): Payer: Self-pay

## 2021-12-23 DIAGNOSIS — E669 Obesity, unspecified: Secondary | ICD-10-CM | POA: Diagnosis present

## 2021-12-23 DIAGNOSIS — E785 Hyperlipidemia, unspecified: Secondary | ICD-10-CM | POA: Diagnosis not present

## 2021-12-23 DIAGNOSIS — I5033 Acute on chronic diastolic (congestive) heart failure: Secondary | ICD-10-CM | POA: Diagnosis not present

## 2021-12-23 DIAGNOSIS — Z91128 Patient's intentional underdosing of medication regimen for other reason: Secondary | ICD-10-CM | POA: Diagnosis not present

## 2021-12-23 DIAGNOSIS — I110 Hypertensive heart disease with heart failure: Secondary | ICD-10-CM | POA: Diagnosis not present

## 2021-12-23 DIAGNOSIS — Z79899 Other long term (current) drug therapy: Secondary | ICD-10-CM | POA: Diagnosis not present

## 2021-12-23 DIAGNOSIS — Z833 Family history of diabetes mellitus: Secondary | ICD-10-CM | POA: Diagnosis not present

## 2021-12-23 DIAGNOSIS — I509 Heart failure, unspecified: Secondary | ICD-10-CM

## 2021-12-23 DIAGNOSIS — E66811 Obesity, class 1: Secondary | ICD-10-CM | POA: Diagnosis present

## 2021-12-23 DIAGNOSIS — Z91199 Patient's noncompliance with other medical treatment and regimen due to unspecified reason: Secondary | ICD-10-CM | POA: Diagnosis not present

## 2021-12-23 DIAGNOSIS — Z800 Family history of malignant neoplasm of digestive organs: Secondary | ICD-10-CM

## 2021-12-23 DIAGNOSIS — Z951 Presence of aortocoronary bypass graft: Secondary | ICD-10-CM

## 2021-12-23 DIAGNOSIS — Z7982 Long term (current) use of aspirin: Secondary | ICD-10-CM

## 2021-12-23 DIAGNOSIS — Z7951 Long term (current) use of inhaled steroids: Secondary | ICD-10-CM

## 2021-12-23 DIAGNOSIS — K219 Gastro-esophageal reflux disease without esophagitis: Secondary | ICD-10-CM | POA: Diagnosis present

## 2021-12-23 DIAGNOSIS — Z8673 Personal history of transient ischemic attack (TIA), and cerebral infarction without residual deficits: Secondary | ICD-10-CM | POA: Diagnosis not present

## 2021-12-23 DIAGNOSIS — Z6831 Body mass index (BMI) 31.0-31.9, adult: Secondary | ICD-10-CM | POA: Diagnosis not present

## 2021-12-23 DIAGNOSIS — I25118 Atherosclerotic heart disease of native coronary artery with other forms of angina pectoris: Secondary | ICD-10-CM | POA: Diagnosis not present

## 2021-12-23 DIAGNOSIS — I10 Essential (primary) hypertension: Secondary | ICD-10-CM | POA: Diagnosis not present

## 2021-12-23 DIAGNOSIS — J811 Chronic pulmonary edema: Secondary | ICD-10-CM | POA: Diagnosis not present

## 2021-12-23 DIAGNOSIS — E1169 Type 2 diabetes mellitus with other specified complication: Secondary | ICD-10-CM | POA: Diagnosis not present

## 2021-12-23 DIAGNOSIS — Z955 Presence of coronary angioplasty implant and graft: Secondary | ICD-10-CM

## 2021-12-23 DIAGNOSIS — N179 Acute kidney failure, unspecified: Secondary | ICD-10-CM | POA: Diagnosis present

## 2021-12-23 DIAGNOSIS — J441 Chronic obstructive pulmonary disease with (acute) exacerbation: Secondary | ICD-10-CM | POA: Diagnosis not present

## 2021-12-23 DIAGNOSIS — Z8249 Family history of ischemic heart disease and other diseases of the circulatory system: Secondary | ICD-10-CM | POA: Diagnosis not present

## 2021-12-23 DIAGNOSIS — R0602 Shortness of breath: Secondary | ICD-10-CM | POA: Diagnosis not present

## 2021-12-23 DIAGNOSIS — J9611 Chronic respiratory failure with hypoxia: Secondary | ICD-10-CM | POA: Diagnosis not present

## 2021-12-23 DIAGNOSIS — Z7902 Long term (current) use of antithrombotics/antiplatelets: Secondary | ICD-10-CM

## 2021-12-23 DIAGNOSIS — Z9981 Dependence on supplemental oxygen: Secondary | ICD-10-CM

## 2021-12-23 DIAGNOSIS — I25119 Atherosclerotic heart disease of native coronary artery with unspecified angina pectoris: Secondary | ICD-10-CM | POA: Diagnosis present

## 2021-12-23 DIAGNOSIS — F17210 Nicotine dependence, cigarettes, uncomplicated: Secondary | ICD-10-CM | POA: Diagnosis present

## 2021-12-23 DIAGNOSIS — I252 Old myocardial infarction: Secondary | ICD-10-CM

## 2021-12-23 DIAGNOSIS — Z789 Other specified health status: Secondary | ICD-10-CM | POA: Diagnosis present

## 2021-12-23 DIAGNOSIS — F1090 Alcohol use, unspecified, uncomplicated: Secondary | ICD-10-CM | POA: Diagnosis present

## 2021-12-23 DIAGNOSIS — J449 Chronic obstructive pulmonary disease, unspecified: Secondary | ICD-10-CM | POA: Diagnosis present

## 2021-12-23 DIAGNOSIS — T501X6A Underdosing of loop [high-ceiling] diuretics, initial encounter: Secondary | ICD-10-CM | POA: Diagnosis present

## 2021-12-23 DIAGNOSIS — I2510 Atherosclerotic heart disease of native coronary artery without angina pectoris: Secondary | ICD-10-CM | POA: Diagnosis present

## 2021-12-23 DIAGNOSIS — E119 Type 2 diabetes mellitus without complications: Secondary | ICD-10-CM

## 2021-12-23 DIAGNOSIS — F419 Anxiety disorder, unspecified: Secondary | ICD-10-CM | POA: Diagnosis present

## 2021-12-23 DIAGNOSIS — F1411 Cocaine abuse, in remission: Secondary | ICD-10-CM | POA: Diagnosis present

## 2021-12-23 DIAGNOSIS — G4733 Obstructive sleep apnea (adult) (pediatric): Secondary | ICD-10-CM | POA: Diagnosis present

## 2021-12-23 LAB — CBC
HCT: 46.3 % (ref 39.0–52.0)
Hemoglobin: 15 g/dL (ref 13.0–17.0)
MCH: 31.1 pg (ref 26.0–34.0)
MCHC: 32.4 g/dL (ref 30.0–36.0)
MCV: 96.1 fL (ref 80.0–100.0)
Platelets: 216 10*3/uL (ref 150–400)
RBC: 4.82 MIL/uL (ref 4.22–5.81)
RDW: 15.4 % (ref 11.5–15.5)
WBC: 7.1 10*3/uL (ref 4.0–10.5)
nRBC: 0 % (ref 0.0–0.2)

## 2021-12-23 LAB — BASIC METABOLIC PANEL
Anion gap: 9 (ref 5–15)
BUN: 20 mg/dL (ref 6–20)
CO2: 27 mmol/L (ref 22–32)
Calcium: 9.2 mg/dL (ref 8.9–10.3)
Chloride: 103 mmol/L (ref 98–111)
Creatinine, Ser: 1.13 mg/dL (ref 0.61–1.24)
GFR, Estimated: >60 mL/min (ref >60)
Glucose, Bld: 108 mg/dL — ABNORMAL HIGH (ref 70–99)
Potassium: 4.1 mmol/L (ref 3.5–5.1)
Sodium: 139 mmol/L (ref 135–145)

## 2021-12-23 LAB — GLUCOSE, CAPILLARY
Glucose-Capillary: 114 mg/dL — ABNORMAL HIGH (ref 70–99)
Glucose-Capillary: 203 mg/dL — ABNORMAL HIGH (ref 70–99)

## 2021-12-23 LAB — BRAIN NATRIURETIC PEPTIDE: B Natriuretic Peptide: 184.6 pg/mL — ABNORMAL HIGH (ref 0.0–100.0)

## 2021-12-23 LAB — TROPONIN I (HIGH SENSITIVITY)
Troponin I (High Sensitivity): 20 ng/L — ABNORMAL HIGH (ref <18)
Troponin I (High Sensitivity): 20 ng/L — ABNORMAL HIGH (ref <18)

## 2021-12-23 MED ORDER — PREDNISONE 20 MG PO TABS
40.0000 mg | ORAL_TABLET | Freq: Every day | ORAL | Status: DC
  Administered 2021-12-24: 40 mg via ORAL
  Filled 2021-12-23: qty 2

## 2021-12-23 MED ORDER — EZETIMIBE 10 MG PO TABS
10.0000 mg | ORAL_TABLET | Freq: Every day | ORAL | Status: DC
  Administered 2021-12-23 – 2021-12-26 (×4): 10 mg via ORAL
  Filled 2021-12-23 (×4): qty 1

## 2021-12-23 MED ORDER — FOLIC ACID 1 MG PO TABS
1.0000 mg | ORAL_TABLET | Freq: Every day | ORAL | Status: DC
  Administered 2021-12-23 – 2021-12-26 (×4): 1 mg via ORAL
  Filled 2021-12-23 (×4): qty 1

## 2021-12-23 MED ORDER — GABAPENTIN 100 MG PO CAPS
100.0000 mg | ORAL_CAPSULE | Freq: Three times a day (TID) | ORAL | Status: DC
  Administered 2021-12-23 – 2021-12-26 (×8): 100 mg via ORAL
  Filled 2021-12-23 (×8): qty 1

## 2021-12-23 MED ORDER — ENOXAPARIN SODIUM 40 MG/0.4ML IJ SOSY
40.0000 mg | PREFILLED_SYRINGE | INTRAMUSCULAR | Status: DC
  Administered 2021-12-23 – 2021-12-25 (×3): 40 mg via SUBCUTANEOUS
  Filled 2021-12-23 (×3): qty 0.4

## 2021-12-23 MED ORDER — THIAMINE HCL 100 MG/ML IJ SOLN
100.0000 mg | Freq: Every day | INTRAMUSCULAR | Status: DC
  Filled 2021-12-23: qty 2

## 2021-12-23 MED ORDER — ADULT MULTIVITAMIN W/MINERALS CH
1.0000 | ORAL_TABLET | Freq: Every day | ORAL | Status: DC
  Administered 2021-12-23 – 2021-12-26 (×4): 1 via ORAL
  Filled 2021-12-23 (×4): qty 1

## 2021-12-23 MED ORDER — ALBUTEROL SULFATE (2.5 MG/3ML) 0.083% IN NEBU
2.5000 mg | INHALATION_SOLUTION | RESPIRATORY_TRACT | Status: DC | PRN
  Administered 2021-12-24 – 2021-12-25 (×2): 2.5 mg via RESPIRATORY_TRACT
  Filled 2021-12-23 (×2): qty 3

## 2021-12-23 MED ORDER — METHYLPREDNISOLONE SODIUM SUCC 125 MG IJ SOLR
125.0000 mg | Freq: Once | INTRAMUSCULAR | Status: AC
Start: 2021-12-23 — End: 2021-12-23
  Administered 2021-12-23: 125 mg via INTRAVENOUS
  Filled 2021-12-23: qty 2

## 2021-12-23 MED ORDER — PANTOPRAZOLE SODIUM 40 MG PO TBEC
40.0000 mg | DELAYED_RELEASE_TABLET | Freq: Two times a day (BID) | ORAL | Status: DC
  Administered 2021-12-23 – 2021-12-26 (×6): 40 mg via ORAL
  Filled 2021-12-23 (×7): qty 1

## 2021-12-23 MED ORDER — ISOSORBIDE MONONITRATE ER 60 MG PO TB24
90.0000 mg | ORAL_TABLET | Freq: Every day | ORAL | Status: DC
  Administered 2021-12-24 – 2021-12-26 (×3): 90 mg via ORAL
  Filled 2021-12-23 (×3): qty 1

## 2021-12-23 MED ORDER — FENOFIBRATE 160 MG PO TABS
160.0000 mg | ORAL_TABLET | Freq: Every day | ORAL | Status: DC
  Administered 2021-12-24 – 2021-12-26 (×3): 160 mg via ORAL
  Filled 2021-12-23 (×3): qty 1

## 2021-12-23 MED ORDER — CLOPIDOGREL BISULFATE 75 MG PO TABS
75.0000 mg | ORAL_TABLET | Freq: Every day | ORAL | Status: DC
  Administered 2021-12-24 – 2021-12-26 (×3): 75 mg via ORAL
  Filled 2021-12-23 (×3): qty 1

## 2021-12-23 MED ORDER — ALBUTEROL SULFATE (2.5 MG/3ML) 0.083% IN NEBU
2.5000 mg | INHALATION_SOLUTION | Freq: Once | RESPIRATORY_TRACT | Status: AC
  Administered 2021-12-23: 2.5 mg via RESPIRATORY_TRACT
  Filled 2021-12-23: qty 3

## 2021-12-23 MED ORDER — ACETAMINOPHEN 650 MG RE SUPP: 650.0000 mg | Freq: Four times a day (QID) | RECTAL | Status: DC | PRN

## 2021-12-23 MED ORDER — IPRATROPIUM-ALBUTEROL 0.5-2.5 (3) MG/3ML IN SOLN
3.0000 mL | Freq: Four times a day (QID) | RESPIRATORY_TRACT | Status: DC
  Filled 2021-12-23: qty 3

## 2021-12-23 MED ORDER — LORAZEPAM 1 MG PO TABS: 1.0000 mg | ORAL_TABLET | ORAL | Status: DC | PRN

## 2021-12-23 MED ORDER — ASPIRIN 81 MG PO TBEC
81.0000 mg | DELAYED_RELEASE_TABLET | Freq: Every day | ORAL | Status: DC
  Administered 2021-12-24 – 2021-12-26 (×3): 81 mg via ORAL
  Filled 2021-12-23 (×3): qty 1

## 2021-12-23 MED ORDER — ATORVASTATIN CALCIUM 80 MG PO TABS
80.0000 mg | ORAL_TABLET | Freq: Every day | ORAL | Status: DC
Start: 2021-12-23 — End: 2021-12-26
  Administered 2021-12-23 – 2021-12-26 (×4): 80 mg via ORAL
  Filled 2021-12-23 (×4): qty 1

## 2021-12-23 MED ORDER — MOMETASONE FURO-FORMOTEROL FUM 200-5 MCG/ACT IN AERO
2.0000 | INHALATION_SPRAY | Freq: Two times a day (BID) | RESPIRATORY_TRACT | Status: DC
  Administered 2021-12-24 – 2021-12-26 (×5): 2 via RESPIRATORY_TRACT
  Filled 2021-12-23: qty 8.8

## 2021-12-23 MED ORDER — THIAMINE HCL 100 MG PO TABS
100.0000 mg | ORAL_TABLET | Freq: Every day | ORAL | Status: DC
  Administered 2021-12-23 – 2021-12-26 (×4): 100 mg via ORAL
  Filled 2021-12-23 (×4): qty 1

## 2021-12-23 MED ORDER — FUROSEMIDE 10 MG/ML IJ SOLN
40.0000 mg | Freq: Two times a day (BID) | INTRAMUSCULAR | Status: DC
  Administered 2021-12-24 – 2021-12-25 (×3): 40 mg via INTRAVENOUS
  Filled 2021-12-23 (×3): qty 4

## 2021-12-23 MED ORDER — SODIUM CHLORIDE 0.9 % IV SOLN
250.0000 mL | INTRAVENOUS | Status: DC | PRN
Start: 2021-12-23 — End: 2021-12-26

## 2021-12-23 MED ORDER — TAMSULOSIN HCL 0.4 MG PO CAPS
0.4000 mg | ORAL_CAPSULE | Freq: Every day | ORAL | Status: DC
Start: 2021-12-24 — End: 2021-12-26
  Administered 2021-12-24 – 2021-12-25 (×2): 0.4 mg via ORAL
  Filled 2021-12-23 (×2): qty 1

## 2021-12-23 MED ORDER — LORAZEPAM 2 MG/ML IJ SOLN: 1.0000 mg | INTRAMUSCULAR | Status: DC | PRN

## 2021-12-23 MED ORDER — IPRATROPIUM BROMIDE 0.02 % IN SOLN
0.5000 mg | Freq: Once | RESPIRATORY_TRACT | Status: AC
Start: 2021-12-23 — End: 2021-12-23
  Administered 2021-12-23: 0.5 mg via RESPIRATORY_TRACT
  Filled 2021-12-23: qty 2.5

## 2021-12-23 MED ORDER — NICOTINE 21 MG/24HR TD PT24
21.0000 mg | MEDICATED_PATCH | Freq: Every day | TRANSDERMAL | Status: DC
  Administered 2021-12-23 – 2021-12-26 (×4): 21 mg via TRANSDERMAL
  Filled 2021-12-23 (×4): qty 1

## 2021-12-23 MED ORDER — SODIUM CHLORIDE 0.9% FLUSH: 3.0000 mL | INTRAVENOUS | Status: DC | PRN

## 2021-12-23 MED ORDER — MELATONIN 5 MG PO TABS
5.0000 mg | ORAL_TABLET | Freq: Once | ORAL | Status: AC | PRN
  Administered 2021-12-24: 5 mg via ORAL
  Filled 2021-12-23: qty 1

## 2021-12-23 MED ORDER — SODIUM CHLORIDE 0.9% FLUSH
3.0000 mL | Freq: Two times a day (BID) | INTRAVENOUS | Status: DC
  Administered 2021-12-23 – 2021-12-26 (×5): 3 mL via INTRAVENOUS

## 2021-12-23 MED ORDER — ACETAMINOPHEN 325 MG PO TABS: 650.0000 mg | ORAL_TABLET | Freq: Four times a day (QID) | ORAL | Status: DC | PRN

## 2021-12-23 MED ORDER — TRAZODONE HCL 100 MG PO TABS
100.0000 mg | ORAL_TABLET | Freq: Every day | ORAL | Status: DC
  Administered 2021-12-24 (×2): 100 mg via ORAL
  Filled 2021-12-23 (×2): qty 1

## 2021-12-23 MED ORDER — FUROSEMIDE 10 MG/ML IJ SOLN
60.0000 mg | Freq: Once | INTRAMUSCULAR | Status: AC
  Administered 2021-12-23: 60 mg via INTRAVENOUS
  Filled 2021-12-23: qty 6

## 2021-12-23 MED ORDER — INSULIN ASPART 100 UNIT/ML IJ SOLN
0.0000 [IU] | Freq: Three times a day (TID) | INTRAMUSCULAR | Status: DC
  Administered 2021-12-24 (×2): 3 [IU] via SUBCUTANEOUS
  Administered 2021-12-24: 2 [IU] via SUBCUTANEOUS

## 2021-12-23 MED ORDER — IRBESARTAN 75 MG PO TABS: 75.0000 mg | ORAL_TABLET | Freq: Every day | ORAL | Status: DC

## 2021-12-23 MED ORDER — SPIRONOLACTONE 12.5 MG HALF TABLET
12.5000 mg | ORAL_TABLET | Freq: Every day | ORAL | Status: DC
  Administered 2021-12-24 – 2021-12-26 (×3): 12.5 mg via ORAL
  Filled 2021-12-23 (×3): qty 1

## 2021-12-23 NOTE — Assessment & Plan Note (Signed)
Continue lipitor, tricor and zetia  ?

## 2021-12-23 NOTE — ED Notes (Signed)
Pt reports he is on 2-4L O2 via [place] at home at baseline ?

## 2021-12-23 NOTE — Assessment & Plan Note (Signed)
Smokes 1 PPD Nicotine patch  

## 2021-12-23 NOTE — H&P (Signed)
?History and Physical  ? ? ?Patient: [name]. YTK:[id number] DOB: 1961-07-24 ?DOA: 12/23/2021 ?DOS: the patient was seen and examined on 12/23/2021 ?PCP: Colon Branch, MD  ?Patient coming from: Home - lives alone. Ambulates independently  ? ? ?Chief Complaint: swelling, shortness of breath  ? ?HPI: [name]. is a 61 y.o. male with medical history significant of CAD, diastolic CHF, HTN, HLD, IDA, hx of OSA, cocaine abuse, T2DM, hx of CVA, COPD, alcohol abuse, chronic respiratory failure on 2-4L oxygen PRN who presents with 2-4 week history of worsening leg swelling, shortness of breath. He states it's hard to talk in full sentences and can hardly walk any distance without having to stop due to shortness of breath.  He has been wearing his oxygen at night, but not regularly during the day.  He was supposed to be taking his lasix 36m TID, but has only been taking it once a day in the AM because he didn't want to have to urinate frequently. This was at least a month ago that this was prescribed TID. He has increased leg swelling and orthopnea. He has gained about 3 pounds, states his weight fluctuates.  ? ?He doesn't feel like he has had any wheezing and doesn't use a rescue inhaler. No coughing.  ? ?Denies any fever/chills, vision changes/headaches, chest pain or palpitations, abdominal pain, N/V/D, dysuria.  ? ? ?Smokes 1PPD, drinks beer 1-2 beers/night ? ?ER Course:  vitals: afebrile, bp: 141/70, HR: 66, RR: 20, oxygen: 93% RA ?Pertinent labs: bnp: 184, troponin 20>pending,  ?CXR: cardiomegaly and pulmonary vascular congestion  ?In ED: given steroids, atrovent, albuterol and 656mIV lasix.  ? ? ?Review of Systems: As mentioned in the history of present illness. All other systems reviewed and are negative. ?Past Medical History:  ?Diagnosis Date  ? (HFpEF) heart failure with preserved ejection fraction ([place]  ? Echo 6/22: EF 55-60, no RWMA, GR 3 DD, elevated LVEDP, normal RVSF, RVSP 53.2, mild to  moderate AV sclerosis without stenosis, dilated aortic root (41 mm), ascending aorta 37 mm  ? Abnormality of gait   ? Anxiety   ? CAD (coronary artery disease)   ? a. 10/2008 Inferolateral STEMI: 3VD w/ occluded LCX (PTCA)-->CABG x 3 (LIMA->LAD, VG->OM, VG->PDA);  b. 06/2009 NSTEMI/PCI: VG->OM 100 (BMS);  c. 05/2013 native 3VD, 3/3 patent grafts. d. 06/2016: Inferior STEMI w/ occluded SVG-OM (DES placed). LIMA-LAD and SVG-PDA patent.  ? Chronic bronchitis ([place]  ? Chronic diastolic CHF (congestive heart failure) ([place]  ? a. 02/2015 Echo: EF 50-55%, distal septal, apical, inferobasal HK, mild LVH, mild MR, mildly dil LA.  ? Cocaine use   ? Colon polyps   ? a. 09/2015 colonscopy: multiple sessile polyps, path negative for high grade dysplasia.  ? Depression   ? Emphysema (subcutaneous) (surgical) resulting from a procedure 10/2008.  ? Bleb resected at time of 10/2008 CABG. Marketed emphysema noted on surgical report.  ? Esophagitis   ? a. 2017 EGD: esophagitis, duodenitis.  ? ETOH abuse   ? GERD (gastroesophageal reflux disease)   ? Hemianopia, homonymous, right   ? "can't see out the right side of either eye since stroke in 2016/pt description 09/23/2016  ? Hepatic steatosis   ? Hiatal hernia   ? History of nuclear stress test 08/2017  ? Nuclear stress test 1/19: EF 35, inf-lat and ant-lat, apical sept/apical scar; no ischemia; Intermediate Risk  ? Hyperlipidemia   ? Hypertension   ? Iron  deficiency anemia 2010  ? Morbid obesity ([place])   ? Myocardial infarction [facility]) "several"  ? PAD (peripheral artery disease) ([place])   ? Pancreatitis 06/2015  ? Sleep apnea   ? "don't have a mask" (09/23/2016)  ? Stroke [facility]) 02/2015  ? a. 02/2015 Carotid U/S: 1-39% bilat ICA stenosis; "left me blind" (09/23/2016)  ? Tobacco abuse   ? still smokes  ? Tubular adenoma of colon   ? ?Past Surgical History:  ?Procedure Laterality Date  ? ABDOMINAL AORTOGRAM W/LOWER EXTREMITY N/A 09/23/2016  ? Procedure: Abdominal Aortogram w/Lower Extremity;   Surgeon: Wellington Hampshire, MD;  Location: [place] [facility];  Service: Cardiovascular;  Laterality: N/A;  ? CARDIAC CATHETERIZATION  11/07/2008  ? EF of 50-55% -- normal LV systolic function, acute inferolateral ST elevation MI,  PTCA of the circumflex artery -- [name]. [name], [name]  ? CARDIAC CATHETERIZATION N/A 07/07/2016  ? Procedure: Left Heart Cath and Cors/Grafts Angiography;  Surgeon: [name], MD;  Location: [place] CV [facility];  Service: Cardiovascular;  Laterality: N/A;  ? CARDIAC CATHETERIZATION N/A 07/07/2016  ? Procedure: Coronary Stent Intervention;  Surgeon: [name], MD;  Location: [place] [facility];  Service: Cardiovascular;  Laterality: N/A;  ? CORONARY ANGIOPLASTY    ? CORONARY ARTERY BYPASS GRAFT    ? "CABG X4"  ? LEFT HEART CATH AND CORS/GRAFTS ANGIOGRAPHY N/A 10/01/2017  ? Procedure: LEFT HEART CATH AND CORS/GRAFTS ANGIOGRAPHY;  Surgeon: Burnell Blanks, MD;  Location: [place] [facility];  Service: Cardiovascular;  Laterality: N/A;  ? LEFT HEART CATHETERIZATION WITH CORONARY/GRAFT ANGIOGRAM N/A 05/15/2013  ? Procedure: LEFT HEART CATHETERIZATION WITH Beatrix Fetters;  Surgeon: [name], MD;  Location[facility] C[facility] LAB;  Service: Cardiovascular;  Laterality: N/A;  ? PERIPHERAL VASCULAR INTERVENTION  09/23/2016  ? Procedure: Peripheral Vascular Intervention;  Surgeon: Wellington Hampshire, MD;  Location: [place] CV LAB;  Service: Cardiovascular;;  Lt. SFA  ? ?Social History:  reports that he has been smoking cigarettes. He started smoking about 47 years ago. He has a 22.50 pack-year smoking history. He has never used smokeless tobacco. He reports current alcohol use of about 14.0 standard drinks per week. He reports that he does not use drugs. ? ?Allergies  ?Allergen Reactions  ? Bupropion Nausea Only  ? ? ?Family History  ?Problem Relation Age of Onset  ? Heart disease Mother   ? Heart failure Mother   ? Diabetes Mother   ? Colon cancer Father   ?     late 49s  ?  Prostate cancer Neg Hx   ? ? ?Prior to Admission medications   ?Medication Sig Start Date End Date Taking? Authorizing Provider  ?aspirin EC 81 MG EC tablet Take 1 tablet (81 mg total) by mouth daily with breakfast. 11/28/18   Denton Brick, Courage, MD  ?atorvastatin (LIPITOR) 80 MG tablet TAKE 1 TABLET EVERY DAY AT 6:00PM 11/25/21   Burnell Blanks, MD  ?augmented betamethasone dipropionate (DIPROLENE-AF) 0.05 % cream APPLY TOPICALLY TWICE DAILY 08/22/21   Colon Branch, MD  ?B COMPLEX-C ER PO Take 1 tablet by mouth daily.    [provider]  ?Blood Glucose Monitoring Suppl (ONETOUCH VERIO REFLECT) w/Device KIT Use to check blood glucose daily (DX: type 2 DM - E 11.51) 12/19/21   Colon Branch, MD  ?budesonide-formoterol [facility]) 160-4.5 MCG/ACT inhaler Inhale 2 puffs into the lungs 2 (two) times daily. 05/21/21   Colon Branch, MD  ?Cholecalciferol (VITAMIN  D) 125 MCG (5000 UT) CAPS Take 1 capsule by mouth daily.    [provider]  ?clopidogrel (PLAVIX) 75 MG tablet TAKE 1 TABLET EVERY DAY 12/08/21   Burnell Blanks, MD  ?Cyanocobalamin (VITAMIN B-12) 5000 MCG SUBL Place 1 tablet under the tongue daily.    [provider]  ?diclofenac Sodium (VOLTAREN) 1 % GEL Apply 1 application. topically daily as needed for pain. 10/17/21   [provider]  ?ezetimibe (ZETIA) 10 MG tablet Take 1 tablet (10 mg total) by mouth daily. 04/30/21   Burnell Blanks, MD  ?fenofibrate (TRICOR) 145 MG tablet TAKE 1 TABLET EVERY DAY 11/19/21   Colon Branch, MD  ?folic acid (FOLVITE) 1 MG tablet Take 1 tablet (1 mg total) by mouth daily. 11/28/18   Roxan Hockey, MD  ?furosemide (LASIX) 20 MG tablet Take 20 mg by mouth 3 (three) times daily. 09/09/21   Colon Branch, MD  ?gabapentin (NEURONTIN) 100 MG capsule Take 2 capsules (200 mg total) by mouth 3 (three) times daily. 12/05/21   Colon Branch, MD  ?glucose blood (ONETOUCH VERIO) test strip Use to check blood glucose once a day (DX: 11.51 - type 2  DM) 12/19/21   Colon Branch, MD  ?isosorbide mononitrate (IMDUR) 60 MG 24 hr tablet Take 1.5 tablets (90 mg total) by mouth daily. 03/08/21   Burnell Blanks, MD  ?MELATONIN GUMMIES PO Take 5 mg by mou

## 2021-12-23 NOTE — Assessment & Plan Note (Addendum)
Continue cpap at night, poorly compliant with this at home  ?

## 2021-12-23 NOTE — ED Provider Notes (Incomplete)
Patient is a 61 year old male with a history of COPD, severe diastolic dysfunction but preserved LV function and moderate pulmonary hypertension with prior CAD status post CABG, ongoing tobacco use who is presenting with complaint of shortness of breath, abdominal distention and leg swelling.  He also reports a ongoing cough with intermittent sputum production.  He has not been using his inhalers regularly at home but is taking Lasix 3 times a day a total of 60 mg.  He has been in touch with cardiology based on notes in the records who recommended he come to the emergency room.  He does use oxygen at night and sometimes during the day but does not always use it.  He is currently on 3 L and satting at 100% but is tachypneic.  He has diffuse wheezing on exam but also evidence of fluid overload with edema in his ankles and abdominal distention.  Concern for CHF exacerbation however concern for possible COPD exacerbation.  Low suspicion for pneumonia at this time.  I independently interpreted patient's EKG and labs today.  Patient does have a new right bundle branch block since his last EKG in 2021, BMP today and CBC are within normal limits.  BNP is mildly elevated at 184 and troponin is pending.  Patient reports that he gets chronic chest pain and does not feel like today is any different. I have independently visualized and interpreted pt's images today.  Chest x-ray today with cardiomegaly and signs of pulmonary edema.  In addition to 60 mg of Lasix patient was also given albuterol, Atrovent and steroids.  Will reassess.  Feel that patient will need admission today for ongoing diuresis and COPD treatment. ? ?

## 2021-12-23 NOTE — ED Provider Notes (Signed)
[facility] EMERGENCY DEPARTMENT Provider Note   CSN: [id number] Arrival date & time: 12/23/21  1211     History Chief Complaint  Patient presents with   Shortness of Breath   Chest Pain   Leg Swelling    [name]. is a 61 y.o. male.  Patient presents with a few weeks duration of worsening chest pain, dyspnea and swelling. He states that he finally comes in today because his symptoms have worsened. History of diastolic heart failure, follows up with cardiology. Prescribed lasix 20 mg tid but only takes it twice daily since he does not want to urinate a lot. Uses 2-4 L O2 at baseline as needed, mainly when he is active. Says that he gets chest pain often with his angina diagnosis and takes nitroglycerin which helps.   The history is provided by the patient. No language interpreter was used.  Shortness of Breath Severity:  Mild Onset quality:  Gradual Timing:  Constant Progression:  Worsening Chronicity:  Recurrent Context: activity   Relieved by:  Sitting up Associated symptoms: chest pain   Associated symptoms: no abdominal pain, no cough, no fever, no headaches, no hemoptysis, no sputum production and no vomiting   Risk factors: tobacco use   Chest Pain Associated symptoms: shortness of breath   Associated symptoms: no abdominal pain, no cough, no dizziness, no fever, no headache, no nausea, no palpitations, no vomiting and no weakness   Risk factors: diabetes mellitus, high cholesterol, hypertension, male sex and smoking       Home Medications Prior to Admission medications   Medication Sig Start Date End Date Taking? Authorizing Provider  aspirin EC 81 MG EC tablet Take 1 tablet (81 mg total) by mouth daily with breakfast. 11/28/18   Denton Brick, Courage, MD  atorvastatin (LIPITOR) 80 MG tablet TAKE 1 TABLET EVERY DAY AT 6:00PM 11/25/21   Burnell Blanks, MD  augmented betamethasone dipropionate (DIPROLENE-AF) 0.05 % cream APPLY TOPICALLY  TWICE DAILY 08/22/21   Colon Branch, MD  B COMPLEX-C ER PO Take 1 tablet by mouth daily.    [provider]  Blood Glucose Monitoring Suppl (ONETOUCH VERIO REFLECT) w/Device KIT Use to check blood glucose daily (DX: type 2 DM - E 11.51) 12/19/21   Colon Branch, MD  budesonide-formoterol [facility]) 160-4.5 MCG/ACT inhaler Inhale 2 puffs into the lungs 2 (two) times daily. 05/21/21   Colon Branch, MD  Cholecalciferol (VITAMIN D) 125 MCG (5000 UT) CAPS Take 1 capsule by mouth daily.    [provider]  clopidogrel (PLAVIX) 75 MG tablet TAKE 1 TABLET EVERY DAY 12/08/21   Burnell Blanks, MD  Cyanocobalamin (VITAMIN B-12) 5000 MCG SUBL Place 1 tablet under the tongue daily.    [provider]  diclofenac Sodium (VOLTAREN) 1 % GEL Apply 1 application. topically daily as needed for pain. 10/17/21   [provider]  ezetimibe (ZETIA) 10 MG tablet Take 1 tablet (10 mg total) by mouth daily. 04/30/21   Burnell Blanks, MD  fenofibrate (TRICOR) 145 MG tablet TAKE 1 TABLET EVERY DAY 11/19/21   Colon Branch, MD  folic acid (FOLVITE) 1 MG tablet Take 1 tablet (1 mg total) by mouth daily. 11/28/18   Roxan Hockey, MD  furosemide (LASIX) 20 MG tablet Take 20 mg by mouth 3 (three) times daily. 09/09/21   Colon Branch, MD  gabapentin (NEURONTIN) 100 MG capsule Take 2 capsules (200 mg total) by mouth 3 (three) times  daily. 12/05/21   Colon Branch, MD  glucose blood (ONETOUCH VERIO) test strip Use to check blood glucose once a day (DX: 11.51 - type 2 DM) 12/19/21   Colon Branch, MD  isosorbide mononitrate (IMDUR) 60 MG 24 hr tablet Take 1.5 tablets (90 mg total) by mouth daily. 03/08/21   Burnell Blanks, MD  MELATONIN GUMMIES PO Take 5 mg by mouth at bedtime as needed.    [provider]  metoprolol tartrate (LOPRESSOR) 25 MG tablet Take 25 mg by mouth 2 (two) times daily. 09/18/21   [provider]  nitroGLYCERIN (NITROSTAT) 0.4 MG SL tablet Place 1 tablet  (0.4 mg total) under the tongue every 5 (five) minutes x 3 doses as needed for chest pain. 03/10/21   Colon Branch, MD  OneTouch Delica Lancets 97Q MISC Use to check blood glucose once a day (DX: E11.51 type 2 DM) 12/19/21   Colon Branch, MD  pantoprazole (PROTONIX) 40 MG tablet TAKE 1 TABLET TWICE DAILY 05/19/21   Burnell Blanks, MD  spironolactone (ALDACTONE) 25 MG tablet Take 0.5 tablets (12.5 mg total) by mouth daily. 03/18/21   Burnell Blanks, MD  tamsulosin (FLOMAX) 0.4 MG CAPS capsule Take 1 capsule (0.4 mg total) by mouth in the morning and at bedtime. 10/16/21   Colon Branch, MD  thiamine 100 MG tablet Take 1 tablet (100 mg total) by mouth daily. 11/29/18   Roxan Hockey, MD  traZODone (DESYREL) 100 MG tablet Take 1 tablet (100 mg total) by mouth at bedtime. 05/22/21   Colon Branch, MD  valsartan (DIOVAN) 80 MG tablet Take 1 tablet (80 mg total) by mouth daily. 03/08/21   Burnell Blanks, MD      Allergies    Bupropion    Review of Systems   Review of Systems  Constitutional:  Negative for fever.  HENT:  Negative for congestion.   Eyes:  Negative for visual disturbance.  Respiratory:  Positive for shortness of breath. Negative for cough, hemoptysis and sputum production.   Cardiovascular:  Positive for chest pain and leg swelling. Negative for palpitations.  Gastrointestinal:  Negative for abdominal pain, constipation, nausea and vomiting.  Genitourinary:  Negative for dysuria.  Neurological:  Negative for dizziness, weakness and headaches.  Psychiatric/Behavioral:  Negative for confusion.    Physical Exam Updated Vital Signs BP 123/69   Pulse 61   Temp 98.6 F (37 C) (Oral)   Resp 17   Ht '5\' 11"'  (1.803 m)   Wt 104.9 kg   SpO2 96%   BMI 32.25 kg/m  Physical Exam Vitals reviewed.  Constitutional:      General: He is not in acute distress.    Appearance: He is well-developed.  HENT:     Head: Normocephalic and atraumatic.     Mouth/Throat:      Mouth: Mucous membranes are moist.     Pharynx: Oropharynx is clear. No pharyngeal swelling or oropharyngeal exudate.  Eyes:     Pupils: Pupils are equal, round, and reactive to light.  Neck:     Thyroid: No thyromegaly.     Vascular: No JVD.  Cardiovascular:     Rate and Rhythm: Normal rate and regular rhythm.     Pulses: Normal pulses. No decreased pulses.     Heart sounds: Normal heart sounds. No murmur heard.   No gallop.  Pulmonary:     Effort: Respiratory distress present. No tachypnea, bradypnea or accessory muscle usage.  Breath sounds: Examination of the right-upper field reveals rhonchi and rales. Examination of the left-upper field reveals rhonchi and rales. Examination of the right-middle field reveals rales. Examination of the left-middle field reveals rales. Examination of the right-lower field reveals decreased breath sounds and rales. Examination of the left-lower field reveals decreased breath sounds and rales. Decreased breath sounds, rhonchi and rales present.     Comments: Mildly increased respiratory effort on 4L O2 Chest:     Chest wall: No tenderness or crepitus.  Abdominal:     General: Bowel sounds are normal.     Tenderness: There is no abdominal tenderness.     Comments: Firm abdomen with distention noted   Musculoskeletal:     Cervical back: Normal range of motion and neck supple.     Right lower leg: No tenderness. Edema present.     Left lower leg: No tenderness. Edema present.     Comments: 1-2+ pitting LE edema bilaterally   Lymphadenopathy:     Cervical: No cervical adenopathy.  Skin:    General: Skin is warm and dry.     Capillary Refill: Capillary refill takes less than 2 seconds.     Findings: No erythema or rash.  Neurological:     General: No focal deficit present.     Mental Status: He is alert and oriented to person, place, and time.     Motor: No weakness.  Psychiatric:        Mood and Affect: Mood normal.        Behavior: Behavior  normal.    ED Results / Procedures / Treatments   Labs (all labs ordered are listed, but only abnormal results are displayed) Labs Reviewed  BASIC METABOLIC PANEL - Abnormal; Notable for the following components:      Result Value   Glucose, Bld 108 (*)    All other components within normal limits  BRAIN NATRIURETIC PEPTIDE - Abnormal; Notable for the following components:   B Natriuretic Peptide 184.6 (*)    All other components within normal limits  TROPONIN I (HIGH SENSITIVITY) - Abnormal; Notable for the following components:   Troponin I (High Sensitivity) 20 (*)    All other components within normal limits  CBC  RAPID URINE DRUG SCREEN, HOSP PERFORMED  TROPONIN I (HIGH SENSITIVITY)    EKG EKG Interpretation  Date/Time:  Tuesday Dec 23 2021 12:36:31 EDT Ventricular Rate:  68 PR Interval:  72 QRS Duration: 132 QT Interval:  408 QTC Calculation: 433 R Axis:   188 Text Interpretation: sinus new Right bundle branch block Possible Lateral infarct , age undetermined When compared with ECG of 26-Nov-2018 12:16, PREVIOUS ECG IS PRESENT Confirmed by Blanchie Dessert [phone]) on 12/23/2021 2:26:31 PM  Radiology DG Chest 2 View  Result Date: 12/23/2021 CLINICAL DATA:  Shortness of breath EXAM: CHEST - 2 VIEW COMPARISON:  Chest x-ray 08/05/2021 FINDINGS: Heart is enlarged. Mediastinum appears stable. Cardiac surgical changes and median sternotomy wires. Pulmonary vascular congestion with no focal consolidation. No pleural effusion or pneumothorax identified. IMPRESSION: Cardiomegaly and pulmonary vascular congestion. Electronically Signed   By: Ofilia Neas [name]   On: 12/23/2021 13:09    Procedures Procedures  None  Medications Ordered in ED Medications  furosemide (LASIX) injection 60 mg (has no administration in time range)  albuterol (PROVENTIL) (2.5 MG/3ML) 0.083% nebulizer solution 2.5 mg (has no administration in time range)  ipratropium (ATROVENT) nebulizer solution  0.5 mg (has no administration in time range)  methylPREDNISolone sodium succinate (  SOLU-MEDROL) 125 mg/2 mL injection 125 mg (has no administration in time range)    ED Course/ Medical Decision Making/ A&P                           Medical Decision Making Amount and/or Complexity of Data Reviewed Labs: ordered. Radiology: ordered.  Risk Prescription drug management. Decision regarding hospitalization.    Patient with history of chronic diastolic heart failure presents with worsening dyspnea, chest pain and anasarca. Most recent echo performed about a year ago notable for EF 55-60& with grade 3 diastolic dysfunction. On exam, patient noted to have diffuse rales, distended abdomen and pitting lower extremity edema bilaterally. CXR demonstrated cardiomegaly and pulmonary vascular congestion. BNP mildly elevated at 184.6. Patient is on baseline home oxygen but likely here for acute on chronic diastolic heart failure in the setting of poor medication compliance. Renal function reassuringly within normal limits. Will give lasix 60 mg x1 and monitor I/Os. Patient will likely require admission due to continued monitoring and fluid status. Spoke with medicine team who agrees for admission.   Final Clinical Impression(s) / ED Diagnoses Final diagnoses:  Acute on chronic diastolic heart failure ([place])  COPD exacerbation [facility])    Rx / DC Orders ED Discharge Orders     None         Donney Dice, DO 12/23/21 1611    Blanchie Dessert, MD 12/25/21 [phone]

## 2021-12-23 NOTE — Assessment & Plan Note (Addendum)
Continue proton pump inhibitors.

## 2021-12-23 NOTE — Assessment & Plan Note (Deleted)
Appears to be remote UDS pending with SOB

## 2021-12-23 NOTE — ED Provider Triage Note (Signed)
Emergency Medicine Provider Triage Evaluation Note ? ?[name]. , a 61 y.o. male  was evaluated in triage.  Pt complains of increasing shortness of breath of the last 2 weeks.  Endorses mild chest discomfort, but states that this is chronic due to his conditions.  Denies recent upper respiratory infection, fever, stomach pains, diarrhea, constipation, urinary symptoms.  Notes mild increasing in the lower extremities as well.  Significant history of heart failure, hypertension, emphysema.  On 2 L O2 at home. ? ?Review of Systems  ?Positive: As above ?Negative: As above ? ?Physical Exam  ?BP (!) 141/70 (BP Location: Left Arm)   Pulse 66   Temp 98.6 ?F (37 ?C) (Oral)   Resp 20   Ht '5\' 11"'$  (1.803 m)   Wt 107 kg   SpO2 95%   BMI 32.92 kg/m?  ?Gen:   Awake, no distress   ?Resp:  Increased effort, shallow respirations, crackles globally, communication difficulties ?MSK:   Moves extremities without difficulty  ?Other:  Mild 1+ nonpitting edema bilateral lower extremities.  2+ radial and PT pulses bilaterally. ? ?Medical Decision Making  ?Medically screening exam initiated at 1:19 PM.  Appropriate orders placed.  [name]. was informed that the remainder of the evaluation will be completed by another provider, this initial triage assessment does not replace that evaluation, and the importance of remaining in the ED until their evaluation is complete. ? ?Labs, imaging, EKG ordered ? ?Currently on 4 L O2. ? ?Discussed patient with the triage nurse, patient will be getting the next available room due to his circumstances. ?  ?Prince Rome, PA-C ?27/51/70 1332 ? ?

## 2021-12-23 NOTE — Assessment & Plan Note (Addendum)
His glucose remained stable during his hospitalization, Patient was placed on insulin sliding scale for glucose cover and monitoring.   Continue with statin therapy.

## 2021-12-23 NOTE — ED Notes (Signed)
Pt. Put on 2L oxygen ?

## 2021-12-23 NOTE — ED Triage Notes (Signed)
Pt. Stated, I have CHF and Im SOB with Chest pain and swelling. I cant do anyhting feeling so bad and cant breath. On 2L of oxygen at home ?

## 2021-12-23 NOTE — Assessment & Plan Note (Addendum)
Continue with spironolactone and isosorbide. Resume valsartan at the time of discharge 40 mg daily with close blood pressure monitoring.   Patient has been no compliant with medical therapy at home.

## 2021-12-23 NOTE — Assessment & Plan Note (Addendum)
Contineu oxymetry monitoring and supplmental 02 per [place]  ?

## 2021-12-23 NOTE — Assessment & Plan Note (Addendum)
Patient was placed on supplemental 02, short course of systemic steroids and bronchodilator therapy.   Symptoms have improved, patient will continue bronchodilator therapy and supplemental 02 at home.

## 2021-12-23 NOTE — Assessment & Plan Note (Addendum)
Patient was admitted to the cardiac ward, he was placed on IV furosemide for diuresis, negative fluid balance was achieved, - 3,208, with improvement in his symptoms.   Echocardiogram with preserved LV systolic function 55 to 84% with mild LVH. Right ventricle systolic function is preserved. Moderate elevation in pulmonary artery systolic pressure. Moderate dilatation of bilateral atriums. No significant valvular disease.   Patient will continue medical therapy with spironolactone and SGLT2i Resume valsartan at discharge.

## 2021-12-23 NOTE — Assessment & Plan Note (Addendum)
He is s/p 3V CABG in March 2010 and has had bare metal stent to SVG to Circumflex in November 2010 as well as DES placed in the body of the SVG to OM in November 2017  ?cath 09/2017, patent grafts ? ?Continue with aspirin, clopidogrel and close blood pressure control.  ?Continue with isosorbide.  ?

## 2021-12-23 NOTE — Assessment & Plan Note (Addendum)
No clinical signs of withdrawal.

## 2021-12-24 ENCOUNTER — Observation Stay (HOSPITAL_COMMUNITY): Payer: Medicare HMO

## 2021-12-24 DIAGNOSIS — J441 Chronic obstructive pulmonary disease with (acute) exacerbation: Secondary | ICD-10-CM

## 2021-12-24 DIAGNOSIS — I25118 Atherosclerotic heart disease of native coronary artery with other forms of angina pectoris: Secondary | ICD-10-CM

## 2021-12-24 DIAGNOSIS — Z8673 Personal history of transient ischemic attack (TIA), and cerebral infarction without residual deficits: Secondary | ICD-10-CM | POA: Diagnosis not present

## 2021-12-24 DIAGNOSIS — I110 Hypertensive heart disease with heart failure: Secondary | ICD-10-CM | POA: Diagnosis present

## 2021-12-24 DIAGNOSIS — E785 Hyperlipidemia, unspecified: Secondary | ICD-10-CM

## 2021-12-24 DIAGNOSIS — Z79899 Other long term (current) drug therapy: Secondary | ICD-10-CM | POA: Diagnosis not present

## 2021-12-24 DIAGNOSIS — Z833 Family history of diabetes mellitus: Secondary | ICD-10-CM | POA: Diagnosis not present

## 2021-12-24 DIAGNOSIS — Z91128 Patient's intentional underdosing of medication regimen for other reason: Secondary | ICD-10-CM | POA: Diagnosis not present

## 2021-12-24 DIAGNOSIS — I10 Essential (primary) hypertension: Secondary | ICD-10-CM

## 2021-12-24 DIAGNOSIS — E669 Obesity, unspecified: Secondary | ICD-10-CM | POA: Diagnosis present

## 2021-12-24 DIAGNOSIS — I509 Heart failure, unspecified: Secondary | ICD-10-CM

## 2021-12-24 DIAGNOSIS — E1169 Type 2 diabetes mellitus with other specified complication: Secondary | ICD-10-CM | POA: Diagnosis present

## 2021-12-24 DIAGNOSIS — Z8249 Family history of ischemic heart disease and other diseases of the circulatory system: Secondary | ICD-10-CM | POA: Diagnosis not present

## 2021-12-24 DIAGNOSIS — Z800 Family history of malignant neoplasm of digestive organs: Secondary | ICD-10-CM | POA: Diagnosis not present

## 2021-12-24 DIAGNOSIS — G4733 Obstructive sleep apnea (adult) (pediatric): Secondary | ICD-10-CM | POA: Diagnosis present

## 2021-12-24 DIAGNOSIS — I5033 Acute on chronic diastolic (congestive) heart failure: Secondary | ICD-10-CM

## 2021-12-24 DIAGNOSIS — N179 Acute kidney failure, unspecified: Secondary | ICD-10-CM | POA: Diagnosis not present

## 2021-12-24 DIAGNOSIS — T501X6A Underdosing of loop [high-ceiling] diuretics, initial encounter: Secondary | ICD-10-CM | POA: Diagnosis present

## 2021-12-24 DIAGNOSIS — K219 Gastro-esophageal reflux disease without esophagitis: Secondary | ICD-10-CM | POA: Diagnosis present

## 2021-12-24 DIAGNOSIS — I25119 Atherosclerotic heart disease of native coronary artery with unspecified angina pectoris: Secondary | ICD-10-CM | POA: Diagnosis present

## 2021-12-24 DIAGNOSIS — Z9981 Dependence on supplemental oxygen: Secondary | ICD-10-CM | POA: Diagnosis not present

## 2021-12-24 DIAGNOSIS — J9611 Chronic respiratory failure with hypoxia: Secondary | ICD-10-CM

## 2021-12-24 DIAGNOSIS — Z955 Presence of coronary angioplasty implant and graft: Secondary | ICD-10-CM | POA: Diagnosis not present

## 2021-12-24 DIAGNOSIS — F17210 Nicotine dependence, cigarettes, uncomplicated: Secondary | ICD-10-CM | POA: Diagnosis present

## 2021-12-24 DIAGNOSIS — Z91199 Patient's noncompliance with other medical treatment and regimen due to unspecified reason: Secondary | ICD-10-CM | POA: Diagnosis not present

## 2021-12-24 DIAGNOSIS — Z6831 Body mass index (BMI) 31.0-31.9, adult: Secondary | ICD-10-CM | POA: Diagnosis not present

## 2021-12-24 DIAGNOSIS — I252 Old myocardial infarction: Secondary | ICD-10-CM | POA: Diagnosis not present

## 2021-12-24 LAB — BASIC METABOLIC PANEL
Anion gap: 10 (ref 5–15)
BUN: 22 mg/dL — ABNORMAL HIGH (ref 6–20)
CO2: 24 mmol/L (ref 22–32)
Calcium: 9.4 mg/dL (ref 8.9–10.3)
Chloride: 101 mmol/L (ref 98–111)
Creatinine, Ser: 1.14 mg/dL (ref 0.61–1.24)
GFR, Estimated: >60 mL/min (ref >60)
Glucose, Bld: 150 mg/dL — ABNORMAL HIGH (ref 70–99)
Potassium: 4.1 mmol/L (ref 3.5–5.1)
Sodium: 135 mmol/L (ref 135–145)

## 2021-12-24 LAB — HIV ANTIBODY (ROUTINE TESTING W REFLEX): HIV Screen 4th Generation wRfx: NONREACTIVE

## 2021-12-24 LAB — ECHOCARDIOGRAM COMPLETE
AV Peak grad: 13.7 mmHg
Ao pk vel: 1.85 m/s
Area-P 1/2: 5.02 cm2
Height: 71 in
S' Lateral: 4.7 cm
Weight: 3657.87 oz

## 2021-12-24 LAB — GLUCOSE, CAPILLARY
Glucose-Capillary: 153 mg/dL — ABNORMAL HIGH (ref 70–99)
Glucose-Capillary: 133 mg/dL — ABNORMAL HIGH (ref 70–99)
Glucose-Capillary: 179 mg/dL — ABNORMAL HIGH (ref 70–99)
Glucose-Capillary: 158 mg/dL — ABNORMAL HIGH (ref 70–99)

## 2021-12-24 LAB — CBC
HCT: 45.2 % (ref 39.0–52.0)
Hemoglobin: 14.8 g/dL (ref 13.0–17.0)
MCH: 31.1 pg (ref 26.0–34.0)
MCHC: 32.7 g/dL (ref 30.0–36.0)
MCV: 95 fL (ref 80.0–100.0)
Platelets: 195 10*3/uL (ref 150–400)
RBC: 4.76 MIL/uL (ref 4.22–5.81)
RDW: 15.1 % (ref 11.5–15.5)
WBC: 9 10*3/uL (ref 4.0–10.5)
nRBC: 0 % (ref 0.0–0.2)

## 2021-12-24 MED ORDER — DAPAGLIFLOZIN PROPANEDIOL 10 MG PO TABS
10.0000 mg | ORAL_TABLET | Freq: Every day | ORAL | Status: DC
  Administered 2021-12-24 – 2021-12-25 (×2): 10 mg via ORAL
  Filled 2021-12-24 (×3): qty 1

## 2021-12-24 MED ORDER — MELATONIN 5 MG PO TABS
5.0000 mg | ORAL_TABLET | Freq: Once | ORAL | Status: AC
  Administered 2021-12-24: 5 mg via ORAL
  Filled 2021-12-24: qty 1

## 2021-12-24 MED ORDER — PERFLUTREN LIPID MICROSPHERE
1.0000 mL | INTRAVENOUS | Status: AC | PRN
  Administered 2021-12-24: 6 mL via INTRAVENOUS

## 2021-12-24 NOTE — Progress Notes (Addendum)
?Progress Note ? ? ?Patient: [name]. WUJ:[id number] DOB: 14-Oct-1960 DOA: 12/23/2021     0 ?DOS: the patient was seen and examined on 12/24/2021 ?  ?Brief hospital course: ?[name] was admitted to the hospital with the working diagnosis of decompensated heart failure ? ?61 yo male with the past medical history of coronary artery disease, diastolic heart failure, hypertension, dyslipidemia, T2DM, CVA, COPD, alcohol and cocaine abuse who presented with dyspnea and edema. Reported 2 to 4 weeks of worsening lower extremity edema and dyspnea, to the point where he was symptomatic with minimal efforts. Positive orthopnea, PND and weigh gain (about 3 lbs). As outpatient he was instructed to increase dose of diuretic with no improvement in his symptoms. He has been not compliant with medical therapy at home. On his initial physical examination his blood pressure was 132/72, HR 84, RR 20 and 02 saturation 96%, lungs with occasional wheeze and bilateral coarse breath sounds, heart with S1 and S2 present and rhythmic, abdomen not distended and positive lower extremity edema ++ pitting.  ? ? Na 130, K 4,1 CL 103, bicarbonate at 27 glucose 108 bun 20 cr 1,13  ?BNP 184  ?High sensitive troponin 20 and 20  ?Wbc 7,1 hgb 15 plt 216  ? ?Chest radiograph with cardiomegaly with bilateral hilar vascular congestion and bilateral interstitial infiltrates at the lower lobes bilaterally, more on the left.  ? ?EKG 68 bpm, right axis deviation, right bundle branch block, sinus rhythm, with no significant ST segment or T wave changes.  ? ?Assessment and Plan: ?* Acute on chronic diastolic CHF (congestive heart failure) ([place]) ?Patient with improvement in his symptoms but not yet back to baseline. ?Echocardiogram pending, study from 2022 with preserved LV systolic function.  ? ?Urine output is 1,100 ml.  ?Systolic blood pressure is 134 to 120 mmHg.  ? ?Plan to continue diuresis with IV furosemide to target further negative fluid  balnce.  ?Continue with spironolactone and will add SGLT2i ?Plan to add ARB before discharge.  ? ? ?COPD with acute exacerbation ([place]) ?Improved dyspnea. ?Plan to continue bronchodilator therapy with duoneb and inhaled corticosteroids. ?Discontinue systemic corticosteroids.  ?Continue oxymetry monitoring.  ? ?Type 2 diabetes mellitus with hyperlipidemia ([place]) ?Fasting glucose this am is 150, plant to continue glucose cover and monitoring.  ? ?Continue with statin therapy.  ? ?Essential hypertension ?Continue blood pressure monitoring ?On spironolactone and isosorbide. ?Resume ARB at the time of discharge.  ?Patient has been no compliant with medical therapy at home.   ? ?Chronic respiratory failure with hypoxia ([place]) ?Contineu oxymetry monitoring and supplmental 02 per   ? ?CAD (coronary artery disease) ?He is s/p 3V CABG in March 2010 and has had bare metal stent to SVG to Circumflex in November 2010 as well as DES placed in the body of the SVG to OM in November 2017  ?cath 09/2017, patent grafts ? ?Continue with aspirin, clopidogrel and close blood pressure control.  ?Continue with isosorbide.  ? ?Hx of cocaine abuse ([place]) ?Appears to be remote ?UDS pending with SOB ? ?Cigarette smoker ?Smokes 1PPD ?Nicotine patch  ? ?OSA (obstructive sleep apnea) ?? ?Continue cpap at night, poorly compliant with this at home  ? ?Alcohol use ?No clinical signs of withdrawal, will hold on CIWA for now and will add as needed oral lorazepam in case of anxiety.  ? ?GERD (gastroesophageal reflux disease) ?protonix BID  ? ? ? ? ?  ? ?Subjective: Patient is feeling better, but not back to  his baseline, no chest pain, no nausea or vomiting  ? ?Physical Exam: ?Vitals:  ? 12/24/21 0020 12/24/21 0043 12/24/21 0103 12/24/21 0726  ?BP:   134/77 120/74  ?Pulse: 82  80   ?Resp: (!) '21  20 20  '$ ?Temp:   97.9 ?F (36.6 ?C) 97.8 ?F (36.6 ?C)  ?TempSrc:   Oral Oral  ?SpO2: 95%  (!) 89%   ?Weight:  103.7 kg    ?Height:      ? ?Neurology awake and  alert ?ENT with no pallor ?Cardiovascular with S1 and S2 present and rhythmic with no gallops, rubs or murmurs ?Trace bilateral lower extremity edema  ?Respiratory with no rales or wheezing ?Abdomen with no distention  ?Data Reviewed: ? ? ? ?Family Communication:  no family at the baseline  ? ?Disposition: ?Status is: Observation ?The patient will require care spanning > 2 midnights and should be moved to inpatient because: IV diuresis  ? Planned Discharge Destination: Home ? ? ? ?Author: ?Tawni Millers, MD ?12/24/2021 2:51 PM ? ?For on call review [email or web address].  ?

## 2021-12-24 NOTE — TOC Progression Note (Addendum)
Transition of Care (TOC) - Progression Note    Patient Details  Name: [name]. MRN: [id number] Date of Birth: 12-Jan-1961  Transition of Care [facility]) CM/SW Contact  [name], RN Phone Number: 12/24/2021, 4:19 PM  Clinical Narrative:    Patient lives at home alone , NCM offered choice to patient  for [facility], [place], [place], he states [facility] is ok with him, he has had them before.  NCM made referral to [facility] , she is able to take referral,  soc will begin 24 to 48 hrs post dc.  He states his friend [name] will transport him home at dc.  He has home oxygen with [employer] 2-4 liters .  [employer] will bring a tank up for him to go home with.  He states he eats a low sodium diet, he states he has been checking his blood pressure every day.  He states he has been weighing himself but will start to do this every day. Patient states he does not need resources for SA.    Expected Discharge Plan: [place] Barriers to Discharge: Continued Medical Work up  Expected Discharge Plan and Services Expected Discharge Plan: [place]   Discharge Planning Services: CM Consult Post Acute Care Choice: Durable Medical Equipment, Home Health Living arrangements for the past 2 months: Mobile Home                           HH Arranged: RN, Disease Management, PT, OT HH Agency: [facility] Date [place]: 12/24/21 Time [place]: [id number] Representative spoke with at [place]: [place] ([place]) Interventions    Readmission Risk Interventions     View : No data to display.

## 2021-12-24 NOTE — Care Management Obs Status (Signed)
MEDICARE OBSERVATION STATUS NOTIFICATION ? ? ?Patient Details  ?Name: [name]. ?MRN: [id number] ?Date of Birth: 1961-01-29 ? ? ?Medicare Observation Status Notification Given:  Yes ? ? ? ?Carles Collet, RN ?12/24/2021, 11:30 AM ?

## 2021-12-24 NOTE — Progress Notes (Signed)
Pt given PRN breathing treatment per pt request.  No distress noted.  Set pt up on CPAP for night rest. ?

## 2021-12-24 NOTE — Hospital Course (Addendum)
[name] was admitted to the hospital with the working diagnosis of decompensated heart failure  61 yo male with the past medical history of coronary artery disease, diastolic heart failure, hypertension, dyslipidemia, T2DM, CVA, COPD, alcohol and cocaine abuse who presented with dyspnea and edema. Reported 2 to 4 weeks of worsening lower extremity edema and dyspnea, to the point where he was symptomatic with minimal efforts. Positive orthopnea, PND and weigh gain (about 3 lbs). As outpatient he was instructed to increase dose of diuretic with no improvement in his symptoms. He has been not compliant with medical therapy at home. On his initial physical examination his blood pressure was 132/72, HR 84, RR 20 and 02 saturation 96%, lungs with occasional wheeze and bilateral coarse breath sounds, heart with S1 and S2 present and rhythmic, abdomen not distended and positive lower extremity edema ++ pitting.    Na 130, K 4,1 CL 103, bicarbonate at 27 glucose 108 bun 20 cr 1,13  BNP 184  High sensitive troponin 20 and 20  Wbc 7,1 hgb 15 plt 216   Chest radiograph with cardiomegaly with bilateral hilar vascular congestion and bilateral interstitial infiltrates at the lower lobes bilaterally, more on the left.   EKG 68 bpm, right axis deviation, right bundle branch block, sinus rhythm, with no significant ST segment or T wave changes.   Patient was placed on furosemide for diuresis with improvement in volume status. Developed renal failure that has delayed his discharge.   At the time of his discharge his renal function is improving, plan to follow up as outpatient.

## 2021-12-24 NOTE — Evaluation (Signed)
Occupational Therapy Evaluation ?Patient Details ?Name: [name]. ?MRN: [id number] ?DOB: 01/26/1961 ?Today's Date: 12/24/2021 ? ? ?History of Present Illness Pt is a 61 y.o. male who presented 12/23/21 with x2-4 week hx of progressive lower extremity edema and SOB. Pt admitted with acute on chronic diastolic CHF and acute COPD exacerbation. PMH: CAD, diastolic CHF, HTN, HLD, IDA, hx of OSA, cocaine abuse, T2DM, hx of CVA, COPD, alcohol abuse, chronic respiratory failure on 2-4L oxygen PRN  ? ?Clinical Impression ?  ?PTA pt lives only @ modified independent level. Pt reports feeling better since admission, however frustrated with inability to move around. Pt with unsteady gait at baseline due to previous stroke. Pt most likely close to baseline regarding ADL tasks, however does desat to '@86'$  on RA with activity. Improved to 91 with pursed lip breathing. Pt appears to have a very poor understanding regarding the management of his CHF and COPD and is high risk for readmission. Pt currently smokes, does not weigh himself daily, does not watch his sodium intake/poor knowledge of appropriate food alternatives and does not know how much liquid he should consume daily. Pt also complains of dizziness, which he just "lives with". He had questions regarding his dizziness being related to his long list of medications. MD/nsg notified of pt's concerns. Nsg asked to review the CHF handbook with pt in order to increase knowledge of his disease processes during this admission. Pt very appreciative. Recommend follow up with [facility] with focus on education regarding CHF and COPD and daily activities. Will follow.  ?Recommend nutritional consult during admission if possible.  ?   ? ?Recommendations for follow up therapy are one component of a multi-disciplinary discharge planning process, led by the attending physician.  Recommendations may be updated based on patient status, additional functional criteria and insurance  authorization.  ? ?Follow Up Recommendations ? HHOT; [facility] ?  ?Assistance Recommended at Discharge PRN  ?Patient can return home with the following   ? ?  ?Functional Status Assessment ? Patient has had a recent decline in their functional status and demonstrates the ability to make significant improvements in function in a reasonable and predictable amount of time.  ?Equipment Recommendations ? None recommended by OT  ?  ?Recommendations for Other Services Other (comment) (mobility specialists) ? ? ?  ?Precautions / Restrictions Precautions ?Precautions: Fall;Other (comment) ?Precaution Comments: watch SpO2; R hemiparesis prior CVA ?Restrictions ?Weight Bearing Restrictions: No  ? ?  ? ?Mobility Bed Mobility ?Overal bed mobility: Modified Independent ?  ?  ?  ?  ?  ?  ?General bed mobility comments: Pt able to transition supine > sit EOB with HOB elevated without assistance. ?  ? ?Transfers ?Overall transfer level: Needs assistance ?Equipment used: None ?Transfers: Sit to/from Stand ?Sit to Stand: Supervision ?  ?  ?  ?  ?  ?General transfer comment: Supervision for safety, not using UEs, no LOB. ?  ? ?  ?Balance Overall balance assessment: Mild deficits observed, not formally tested ?  ?  ?  ?  ?  ?  ?  ?  ?  ?  ?  ?  ?  ?  ?  ?  ?  ?  ?   ? ?ADL either performed or assessed with clinical judgement  ? ?ADL Overall ADL's : Needs assistance/impaired ?  ?  ?  ?  ?  ?  ?  ?  ?  ?  ?  ?  ?  ?  ?  ?  ?  ?  ?  Functional mobility during ADLs: Modified independent ?General ADL Comments: Pt close to baseline; reports increased SOB wtih ADL tasks; LB ADL more "challenging because of my belly"; may benefit from a reacher; would benefit from energy conservaiton  ? ? ? ?Vision Baseline Vision/History: 2 Legally blind (per pt) ?Patient Visual Report: Peripheral vision impairment (R field per pt) ?Additional Comments: appears to have a R homonymous hemianopsia  ?   ?Perception   ?  ?Praxis   ?  ? ?Pertinent Vitals/Pain Pain  Assessment ?Pain Assessment: No/denies pain  ? ? ? ?Hand Dominance Right ?  ?Extremity/Trunk Assessment Upper Extremity Assessment ?Upper Extremity Assessment: RUE deficits/detail ?RUE Deficits / Details: residual deficits from previous CVA ?RUE Coordination: decreased fine motor (but using functionally) ?  ?Lower Extremity Assessment ?Lower Extremity Assessment: Defer to PT evaluation ?RLE Deficits / Details: MMT scores of 4- hip flexion, 4 knee extension; reports increased numbness inferior to knee compared to L side with hx of peripheral neuropathy bil; reports prior CVA affected his R side ?RLE Sensation: decreased light touch;history of peripheral neuropathy ?LLE Deficits / Details: MMT scores of 4 hip flexion, 5 knee extension; hx of peripheral neuropathy ?LLE Sensation: history of peripheral neuropathy ?  ?Cervical / Trunk Assessment ?Cervical / Trunk Assessment: Normal ?  ?Communication Communication ?Communication: No difficulties ?  ?Cognition Arousal/Alertness: Awake/alert ?Behavior During Therapy: [facility] for tasks assessed/performed ?Overall Cognitive Status: Within Functional Limits for tasks assessed ?  ?  ?  ?  ?  ?  ?  ?  ?  ?  ?  ?  ?  ?  ?  ?  ?General Comments: Aware of his deficits and he is at risk for falls but does not appreciate the extent of his deficits affecting his safety as he expressed desire to ambulate alone and not have chair alarm on despite education. ?  ?  ?General Comments  SpO2 >/= 91% on 4L at rest, >/= 86% on RA at rest, >/= 84% on 2L with gait, >/= 88% on 4L with gait ? ?  ?Exercises Exercises: Other exercises ?Other Exercises ?Other Exercises: pursed lip breathing ?  ?Shoulder Instructions    ? ? ?Home Living Family/patient expects to be discharged to:: Private residence ?Living Arrangements: Alone ?Available Help at Discharge: Family;Available PRN/intermittently (son and daughter-in-law) ?Type of Home: Mobile home ?Home Access: Stairs to enter ?Entrance Stairs-Number of Steps:  4 ?Entrance Stairs-Rails: Can reach both ?Home Layout: One level ?  ?  ?Bathroom Shower/Tub: Tub/shower unit ?  ?Bathroom Toilet: Standard ?Bathroom Accessibility: Yes ?How Accessible: Accessible via walker ?Home Equipment: Conservation officer, nature (2 wheels);Shower seat;Crutches;Other (comment) (knee scooter) ?  ?Additional Comments: Uses 2L O2 PRN ?  ? ?  ?Prior Functioning/Environment Prior Level of Function : Independent/Modified Independent ?  ?  ?  ?  ?  ?  ?Mobility Comments: Fell 1x ~5-6 months ago. Holds onto furniture around the home. When outside he holds onto whatever he can find. ?ADLs Comments: Getting more difficult to take a shower, but the shower chair is helping per pt. ?  ? ?  ?  ?OT Problem List: Decreased knowledge of use of DME or AE;Cardiopulmonary status limiting activity;Obesity;Decreased activity tolerance ?  ?   ?OT Treatment/Interventions: Self-care/ADL training;Therapeutic exercise;Energy conservation;DME and/or AE instruction;Therapeutic activities;Patient/family education  ?  ?OT Goals(Current goals can be found in the care plan section) Acute Rehab OT Goals ?Patient Stated Goal: to be healthier and not have to come back to the hospital ?OT Goal Formulation: With  patient ?Time For Goal Achievement: 01/07/22 ?Potential to Achieve Goals: Good  ?OT Frequency: Min 2X/week ?  ? ?Co-evaluation   ?  ?  ?  ?  ? ?  ?AM-PAC OT "6 Clicks" Daily Activity     ?Outcome Measure Help from another person eating meals?: None ?Help from another person taking care of personal grooming?: None ?Help from another person toileting, which includes using toliet, bedpan, or urinal?: None ?Help from another person bathing (including washing, rinsing, drying)?: None ?Help from another person to put on and taking off regular upper body clothing?: None ?Help from another person to put on and taking off regular lower body clothing?: None ?6 Click Score: 24 ?  ?End of Session Equipment Utilized During Treatment: Gait  belt;Oxygen (2L) ?Nurse Communication: Mobility status;Other (comment) (DC needs) ? ?Activity Tolerance: Patient tolerated treatment well ?Patient left: in chair;with call bell/phone within reach ? ?OT Visit Diagnosis: [facility] Giv[facility]

## 2021-12-24 NOTE — Progress Notes (Signed)
SATURATION QUALIFICATIONS: (This note is used to comply with regulatory documentation for home oxygen) ? ?Patient Saturations on Room Air at Rest = 86% ? ?Patient Saturations on Room Air while Ambulating = N/A ? ?Patient Saturations on 4 Liters of oxygen while Ambulating = 88% ? ?Please briefly explain why patient needs home oxygen: Pt is requiring supplemental O2 at rest and with mobility to maintain sats >/= 88%. ? ? ?Moishe Spice, PT, DPT ?Acute Rehabilitation Services  ?Pager: [phone] ?Office: [phone] ? ?

## 2021-12-24 NOTE — Progress Notes (Signed)
Echo attempted at 9:30am, patient having physical therapy at this time. ?[place] ?

## 2021-12-24 NOTE — Evaluation (Signed)
Physical Therapy Evaluation ?Patient Details ?Name: [name]. ?MRN: [id number] ?DOB: November 21, 1960 ?Today's Date: 12/24/2021 ? ?History of Present Illness ? Pt is a 61 y.o. male who presented 12/23/21 with x2-4 week hx of progressive lower extremity edema and SOB. Pt admitted with acute on chronic diastolic CHF and acute COPD exacerbation. PMH: CAD, diastolic CHF, HTN, HLD, IDA, hx of OSA, cocaine abuse, T2DM, hx of CVA, COPD, alcohol abuse, chronic respiratory failure on 2-4L oxygen PRN ?  ?Clinical Impression ? Pt presents with condition above and deficits mentioned below, see PT Problem List. PTA, he was living alone in a mobile home with 4 STE, functioning mod I holding onto furniture or objects for support with mobility. He endorses x1 fall in the past 6 months. Pt displays residual R lower extremity weakness from a prior CVA along with bil lower extremity peripheral neuropathy (R>L), balance deficits, and decreased aerobic endurance/activity tolerance. He is at risk for falls, stumbling to the R and needing up to minA intermittently to regain balance while ambulating without UE support today. Educated pt to use his RW at home due to his risk for falls. He verbalized understanding. Pt would benefit from further acute PT services and HHPT follow-up to address his deficits, assess his AD needs, and reduce his risk for falls.    ?   ? ?Recommendations for follow up therapy are one component of a multi-disciplinary discharge planning process, led by the attending physician.  Recommendations may be updated based on patient status, additional functional criteria and insurance authorization. ? ?Follow Up Recommendations Home health PT ? ?  ?Assistance Recommended at Discharge Intermittent Supervision/Assistance  ?Patient can return home with the following ? A little help with walking and/or transfers;Assistance with cooking/housework;Assist for transportation;Help with stairs or ramp for entrance ? ?  ?Equipment  Recommendations Other (comment) (TBA need for cane)  ?Recommendations for Other Services ?    ?  ?Functional Status Assessment Patient has had a recent decline in their functional status and demonstrates the ability to make significant improvements in function in a reasonable and predictable amount of time.  ? ?  ?Precautions / Restrictions Precautions ?Precautions: Fall;Other (comment) ?Precaution Comments: watch SpO2; R hemiparesis prior CVA ?Restrictions ?Weight Bearing Restrictions: No  ? ?  ? ?Mobility ? Bed Mobility ?Overal bed mobility: Modified Independent ?  ?  ?  ?  ?  ?  ?General bed mobility comments: Pt able to transition supine > sit EOB with HOB elevated without assistance. ?  ? ?Transfers ?Overall transfer level: Needs assistance ?Equipment used: None ?Transfers: Sit to/from Stand ?Sit to Stand: Supervision ?  ?  ?  ?  ?  ?General transfer comment: Supervision for safety, not using UEs, no LOB. ?  ? ?Ambulation/Gait ?Ambulation/Gait assistance: Min guard, Min assist ?Gait Distance (Feet): 230 Feet ?Assistive device: None ?Gait Pattern/deviations: Step-through pattern, Decreased stride length, Decreased dorsiflexion - right, Staggering right ?Gait velocity: reduced ?Gait velocity interpretation: 1.31 - 2.62 ft/sec, indicative of limited community ambulator ?  ?General Gait Details: Pt with decreased R foot clearance and intermittently stumbling to the R, needing up to minA to prevent LOB. Pt educated to use his RW at home for improved safety as he is at risk for falls. He verbalized understanding. ? ?Stairs ?  ?  ?  ?  ?  ? ?Wheelchair Mobility ?  ? ?Modified Rankin (Stroke Patients Only) ?  ? ?  ? ?Balance Overall balance assessment: Mild deficits observed, not formally tested ?  ?  ?  ?  ?  ?  ?  ?  ?  ?  ?  ?  ?  ?  ?  ?  ?  ?  ?   ? ? ? ?  Pertinent Vitals/Pain Pain Assessment ?Pain Assessment: No/denies pain  ? ? ?Home Living Family/patient expects to be discharged to:: Private residence ?Living  Arrangements: Alone ?Available Help at Discharge: Family;Available PRN/intermittently (son and daughter-in-law) ?Type of Home: Mobile home ?Home Access: Stairs to enter ?Entrance Stairs-Rails: Can reach both ?Entrance Stairs-Number of Steps: 4 ?  ?Home Layout: One level ?Home Equipment: Conservation officer, nature (2 wheels);Shower seat;Crutches;Other (comment) (knee scooter) ?Additional Comments: Uses 2L O2 PRN  ?  ?Prior Function Prior Level of Function : Independent/Modified Independent ?  ?  ?  ?  ?  ?  ?Mobility Comments: Fell 1x ~5-6 months ago. Holds onto furniture around the home. When outside he holds onto whatever he can find. ?ADLs Comments: Getting more difficult to take a shower, but the shower chair is helping per pt. ?  ? ? ?Hand Dominance  ? Dominant Hand: Right ? ?  ?Extremity/Trunk Assessment  ? Upper Extremity Assessment ?Upper Extremity Assessment: Defer to OT evaluation (bil generalized weakness noted) ?  ? ?Lower Extremity Assessment ?Lower Extremity Assessment: RLE deficits/detail;LLE deficits/detail ?RLE Deficits / Details: MMT scores of 4- hip flexion, 4 knee extension; reports increased numbness inferior to knee compared to L side with hx of peripheral neuropathy bil; reports prior CVA affected his R side ?RLE Sensation: decreased light touch;history of peripheral neuropathy ?LLE Deficits / Details: MMT scores of 4 hip flexion, 5 knee extension; hx of peripheral neuropathy ?LLE Sensation: history of peripheral neuropathy ?  ? ?Cervical / Trunk Assessment ?Cervical / Trunk Assessment: Normal  ?Communication  ? Communication: No difficulties  ?Cognition Arousal/Alertness: Awake/alert ?Behavior During Therapy: [facility] for tasks assessed/performed ?Overall Cognitive Status: Within Functional Limits for tasks assessed ?  ?  ?  ?  ?  ?  ?  ?  ?  ?  ?  ?  ?  ?  ?  ?  ?General Comments: Aware of his deficits and he is at risk for falls but does not appreciate the extent of his deficits affecting his safety as he  expressed desire to ambulate alone and not have chair alarm on despite education. ?  ?  ? ?  ?General Comments General comments (skin integrity, edema, etc.): SpO2 >/= 91% on 4L at rest, >/= 86% on RA at rest, >/= 84% on 2L with gait, >/= 88% on 4L with gait ? ?  ?Exercises    ? ?Assessment/Plan  ?  ?PT Assessment Patient needs continued PT services  ?PT Problem List Decreased strength;Decreased activity tolerance;Decreased mobility;Decreased balance;Decreased safety awareness;Cardiopulmonary status limiting activity;Impaired sensation ? ?   ?  ?PT Treatment Interventions DME instruction;Gait training;Functional mobility training;Stair training;Therapeutic exercise;Therapeutic activities;Balance training;Neuromuscular re-education;Patient/family education   ? ?PT Goals (Current goals can be found in the Care Plan section)  ?Acute Rehab PT Goals ?Patient Stated Goal: to improve his balance ?PT Goal Formulation: With patient ?Time For Goal Achievement: 01/07/22 ?Potential to Achieve Goals: Good ? ?  ?Frequency Min 3X/week ?  ? ? ?Co-evaluation   ?  ?  ?  ?  ? ? ?  ?AM-PAC PT "6 Clicks" Mobility  ?Outcome Measure Help needed turning from your back to your side while in a flat bed without using bedrails?: None ?Help needed moving from lying on your back to sitting on the side of a flat bed without using bedrails?: None ?Help needed moving to and from a bed to a chair (including a wheelchair)?: A Little ?Help needed standing up from a chair using your arms (e.g., wheelchair or  bedside chair)?: A Little ?Help needed to walk in hospital room?: A Little ?Help needed climbing 3-5 steps with a railing? : A Little ?6 Click Score: 20 ? ?  ?End of Session   ?Activity Tolerance: Patient tolerated treatment well ?Patient left: in chair;with call bell/phone within reach;with chair alarm set ?Nurse Communication: Mobility status;Other (comment) (pt verbalizing he is going to get up on his own anyway) ?PT Visit Diagnosis:  Unsteadiness on feet (R26.81);Other abnormalities of gait and mobility (R26.89);Muscle weakness (generalized) (M62.81);Difficulty in walking, not elsewhere classified (R26.2);History of falling (Z91.81) ?  ? ?Time: 0

## 2021-12-25 ENCOUNTER — Other Ambulatory Visit (HOSPITAL_COMMUNITY): Payer: Self-pay

## 2021-12-25 ENCOUNTER — Telehealth: Payer: Self-pay | Admitting: *Deleted

## 2021-12-25 ENCOUNTER — Encounter (HOSPITAL_COMMUNITY): Payer: Self-pay | Admitting: Internal Medicine

## 2021-12-25 DIAGNOSIS — N179 Acute kidney failure, unspecified: Secondary | ICD-10-CM | POA: Diagnosis present

## 2021-12-25 DIAGNOSIS — E669 Obesity, unspecified: Secondary | ICD-10-CM | POA: Diagnosis present

## 2021-12-25 DIAGNOSIS — I5033 Acute on chronic diastolic (congestive) heart failure: Secondary | ICD-10-CM | POA: Diagnosis not present

## 2021-12-25 DIAGNOSIS — E1169 Type 2 diabetes mellitus with other specified complication: Secondary | ICD-10-CM | POA: Diagnosis not present

## 2021-12-25 DIAGNOSIS — I10 Essential (primary) hypertension: Secondary | ICD-10-CM | POA: Diagnosis not present

## 2021-12-25 DIAGNOSIS — J441 Chronic obstructive pulmonary disease with (acute) exacerbation: Secondary | ICD-10-CM | POA: Diagnosis not present

## 2021-12-25 LAB — BASIC METABOLIC PANEL
Anion gap: 8 (ref 5–15)
BUN: 31 mg/dL — ABNORMAL HIGH (ref 6–20)
CO2: 27 mmol/L (ref 22–32)
Calcium: 9.1 mg/dL (ref 8.9–10.3)
Chloride: 101 mmol/L (ref 98–111)
Creatinine, Ser: 1.41 mg/dL — ABNORMAL HIGH (ref 0.61–1.24)
GFR, Estimated: 57 mL/min — ABNORMAL LOW (ref >60)
Glucose, Bld: 117 mg/dL — ABNORMAL HIGH (ref 70–99)
Potassium: 3.8 mmol/L (ref 3.5–5.1)
Sodium: 136 mmol/L (ref 135–145)

## 2021-12-25 LAB — GLUCOSE, CAPILLARY
Glucose-Capillary: 116 mg/dL — ABNORMAL HIGH (ref 70–99)
Glucose-Capillary: 101 mg/dL — ABNORMAL HIGH (ref 70–99)
Glucose-Capillary: 109 mg/dL — ABNORMAL HIGH (ref 70–99)
Glucose-Capillary: 189 mg/dL — ABNORMAL HIGH (ref 70–99)

## 2021-12-25 LAB — MAGNESIUM: Magnesium: 2.3 mg/dL (ref 1.7–2.4)

## 2021-12-25 MED ORDER — TRAZODONE HCL 100 MG PO TABS
200.0000 mg | ORAL_TABLET | Freq: Every day | ORAL | Status: DC
  Administered 2021-12-25: 200 mg via ORAL
  Filled 2021-12-25: qty 2

## 2021-12-25 MED ORDER — ENSURE ENLIVE PO LIQD
237.0000 mL | Freq: Two times a day (BID) | ORAL | Status: DC
  Administered 2021-12-25 – 2021-12-26 (×2): 237 mL via ORAL

## 2021-12-25 NOTE — Chronic Care Management (AMB) (Signed)
  Chronic Care Management   Note  12/25/2021 Name: [name]. MRN: [id number] DOB: 1961-05-17  [name]. is a 61 y.o. year old male who is a primary care patient of [name], [name], MD. [name]. is currently enrolled in care management services. An additional referral for Licensed Clinical SW was placed.   Follow up plan: Telephone appointment with care management team member scheduled for: 01/09/2022  Julian Hy, [place] Management  Direct Dial: [phone]

## 2021-12-25 NOTE — TOC Benefit Eligibility Note (Signed)
Patient Teacher, English as a foreign language completed.    The patient is currently admitted and upon discharge could be taking Farxiga 10 mg.  The current 30 day co-pay is, $90.77.   The patient is insured through [employer], [place] Patient Advocate Specialist [place] Patient Advocate Team Direct Number: [phone]  Fax: [phone]

## 2021-12-25 NOTE — Discharge Instructions (Signed)
Heart Healthy, Consistent Carbohydrate Nutrition Therapy   A heart-healthy and consistent carbohydrate diet is recommended to manage heart disease and diabetes. To follow a heart-healthy and consistent carbohydrate diet, Eat a balanced diet with whole grains, fruits and vegetables, and lean protein sources.  Choose heart-healthy unsaturated fats. Limit saturated fats, trans fats, and cholesterol intake. Eat more plant-based or vegetarian meals using beans and soy foods for protein.  Eat whole, unprocessed foods to limit the amount of sodium (salt) you eat.  Choose a consistent amount of carbohydrate at each meal and snack. Limit refined carbohydrates especially sugar, sweets and sugar-sweetened beverages.  If you drink alcohol, do so in moderation: one serving per day (women) and two servings per day (men). o One serving is equivalent to 12 ounces beer, 5 ounces wine, or 1.5 ounces distilled spirits  Tips Tips for Choosing Heart-Healthy Fats Choose lean protein and low-fat dairy foods to reduce saturated fat intake. Saturated fat is usually found in animal-based protein and is associated with certain health risks. Saturated fat is the biggest contributor to raise low-density lipoprotein (LDL) cholesterol levels. Research shows that limiting saturated fat lowers unhealthy cholesterol levels. Eat no more than 7% of your total calories each day from saturated fat. Ask your RDN to help you determine how much saturated fat is right for you. There are many foods that do not contain large amounts of saturated fats. Swapping these foods to replace foods high in saturated fats will help you limit the saturated fat you eat and improve your cholesterol levels. You can also try eating more plant-based or vegetarian meals. Instead of. Try:  Whole milk, cheese, yogurt, and ice cream 1% or skim milk, low-fat cheese, non-fat yogurt, and low-fat ice cream  Fatty, marbled beef and pork Lean beef, pork, or venison   Poultry with skin Poultry without skin  Butter, stick margarine Reduced-fat, whipped, or liquid spreads  Coconut oil, palm oil Liquid vegetable oils: corn, canola, olive, soybean and safflower oils   Avoid foods that contain trans fats. Trans fats increase levels of LDL-cholesterol. Hydrogenated fat in processed foods is the main source of trans fats in foods.  Trans fats can be found in stick margarine, shortening, processed sweets, baked goods, some fried foods, and packaged foods made with hydrogenated oils. Avoid foods with "partially hydrogenated oil" on the ingredient list such as: cookies, pastries, baked goods, biscuits, crackers, microwave popcorn, and frozen dinners. Choose foods with heart healthy fats. Polyunsaturated and monounsaturated fat are unsaturated fats that may help lower your blood cholesterol level when used in place of saturated fat in your diet. Ask your RDN about taking a dietary supplement with plant sterols and stanols to help lower your cholesterol level. Research shows that substituting saturated fats with unsaturated fats is beneficial to cholesterol levels. Try these easy swaps: Instead of. Try:  Butter, stick margarine, or solid shortening Reduced-fat, whipped, or liquid spreads  Beef, pork, or poultry with skin Fish and seafood  Chips, crackers, snack foods Raw or unsalted nuts and seeds or nut butters Hummus with vegetables Avocado on toast  Coconut oil, palm oil Liquid vegetable oils: corn, canola, olive, soybean and safflower oils  Limit the amount of cholesterol you eat to less than 200 milligrams per day. Cholesterol is a substance carried through the bloodstream via lipoproteins, which are known as "transporters" of fat. Some body functions need cholesterol to work properly, but too much cholesterol in the bloodstream can damage arteries and build up blood vessel linings (  which can lead to heart attack and stroke). You should eat less than 200 milligrams  cholesterol per day. People respond differently to eating cholesterol. There is no test available right now that can figure out which people will respond more to dietary cholesterol and which will respond less. For individuals with high intake of dietary cholesterol, different types of increase (none, small, moderate, large) in LDL-cholesterol levels are all possible.  Food sources of cholesterol include egg yolks and organ meats such as liver, gizzards. Limit egg yolks to two to four per week and avoid organ meats like liver and gizzards to control cholesterol intake. Tips for Choosing Heart-Healthy Carbohydrates Consume a consistent amount of carbohydrate It is important to eat foods with carbohydrates in moderation because they impact your blood glucose level. Carbohydrates can be found in many foods such as: Grains (breads, crackers, rice, pasta, and cereals)  Starchy Vegetables (potatoes, corn, and peas)  Beans and legumes  Milk, soy milk, and yogurt  Fruit and fruit juice  Sweets (cakes, cookies, ice cream, jam and jelly) Your RDN will help you set a goal for how many carbohydrate servings to eat at your meals and snacks. For many adults, eating 3 to 5 servings of carbohydrate foods at each meal and 1 or 2 carbohydrate servings for each snack works well.  Check your blood glucose level regularly. It can tell you if you need to adjust when you eat carbohydrates. Choose foods rich in viscous (soluble) fiber Viscous, or soluble, is found in the walls of plant cells. Viscous fiber is found only in plant-based foods. Eating foods with fiber helps to lower your unhealthy cholesterol and keep your blood glucose in range  Rich sources of viscous fiber include vegetables (asparagus, Brussels sprouts, sweet potatoes, turnips) fruit (apricots, mangoes, oranges), legumes, and whole grains (barley, oats, and oat bran).  As you increase your fiber intake gradually, also increase the amount of water you  drink. This will help prevent constipation.  If you have difficulty achieving this goal, ask your RDN about fiber laxatives. Choose fiber supplements made with viscous fibers such as psyllium seed husks or methylcellulose to help lower unhealthy cholesterol.  Limit refined carbohydrates  There are three types of carbohydrates: starches, sugar, and fiber. Some carbohydrates occur naturally in food, like the starches in rice or corn or the sugars in fruits and milk. Refined carbohydrates--foods with high amounts of simple sugars--can raise triglyceride levels. High triglyceride levels are associated with coronary heart disease. Some examples of refined carbohydrate foods are table sugar, sweets, and beverages sweetened with added sugar. Tips for Reducing Sodium (Salt) Although sodium is important for your body to function, too much sodium can be harmful for people with high blood pressure. As sodium and fluid buildup in your tissues and bloodstream, your blood pressure increases. High blood pressure may cause damage to other organs and increase your risk for a stroke. Even if you take a pill for blood pressure or a water pill (diuretic) to remove fluid, it is still important to have less salt in your diet. Ask your doctor and RDN what amount of sodium is right for you. Avoid processed foods. Eat more fresh foods.  Fresh fruits and vegetables are naturally low in sodium, as well as frozen vegetables and fruits that have no added juices or sauces.  Fresh meats are lower in sodium than processed meats, such as bacon, sausage, and hotdogs. Read the nutrition label or ask your butcher to help you find a   fresh meat that is low in sodium. Eat less salt--at the table and when cooking.  A single teaspoon of table salt has 2,300 mg of sodium.  Leave the salt out of recipes for pasta, casseroles, and soups.  Ask your RDN how to cook your favorite recipes without sodium Be a smart shopper.  Look for food packages  that say "salt-free" or "sodium-free." These items contain less than 5 milligrams of sodium per serving.  "Very low-sodium" products contain less than 35 milligrams of sodium per serving.  "Low-sodium" products contain less than 140 milligrams of sodium per serving.  Beware for "Unsalted" or "No Added Salt" products. These items may still be high in sodium. Check the nutrition label. Add flavors to your food without adding sodium.  Try lemon juice, lime juice, fruit juice or vinegar.  Dry or fresh herbs add flavor. Try basil, bay leaf, dill, rosemary, parsley, sage, dry mustard, nutmeg, thyme, and paprika.  Pepper, red pepper flakes, and cayenne pepper can add spice t your meals without adding sodium. Hot sauce contains sodium, but if you use just a drop or two, it will not add up to much.  Buy a sodium-free seasoning blend or make your own at home. Additional Lifestyle Tips Achieve and maintain a healthy weight. Talk with your RDN or your doctor about what is a healthy weight for you. Set goals to reach and maintain that weight.  To lose weight, reduce your calorie intake along with increasing your physical activity. A weight loss of 10 to 15 pounds could reduce LDL-cholesterol by 5 milligrams per deciliter. Participate in physical activity. Talk with your health care team to find out what types of physical activity are best for you. Set a plan to get about 30 minutes of exercise on most days.  Foods Recommended Food Group Foods Recommended  Grains Whole grain breads and cereals, including whole wheat, barley, rye, buckwheat, corn, teff, quinoa, millet, amaranth, brown or wild rice, sorghum, and oats Pasta, especially whole wheat or other whole grain types  Brown rice, quinoa or wild rice Whole grain crackers, bread, rolls, pitas Home-made bread with reduced-sodium baking soda  Protein Foods Lean cuts of beef and pork (loin, leg, round, extra lean hamburger)  Skinless poultry Fish Venison  and other wild game Dried beans and peas Nuts and nut butters Meat alternatives made with soy or textured vegetable protein  Egg whites or egg substitute Cold cuts made with lean meat or soy protein  Dairy Nonfat (skim), low-fat, or 1%-fat milk  Nonfat or low-fat yogurt or cottage cheese Fat-free and low-fat cheese  Vegetables Fresh, frozen, or canned vegetables without added fat or salt   Fruits Fresh, frozen, canned, or dried fruit   Oils Unsaturated oils (corn, olive, peanut, soy, sunflower, canola)  Soft or liquid margarines and vegetable oil spreads  Salad dressings Seeds and nuts  Avocado   Foods Not Recommended Food Group Foods Not Recommended  Grains Breads or crackers topped with salt Cereals (hot or cold) with more than 300 mg sodium per serving Biscuits, cornbread, and other "quick" breads prepared with baking soda Bread crumbs or stuffing mix from a store High-fat bakery products, such as doughnuts, biscuits, croissants, danish pastries, pies, cookies Instant cooking foods to which you add hot water and stir--potatoes, noodles, rice, etc. Packaged starchy foods--seasoned noodle or rice dishes, stuffing mix, macaroni and cheese dinner Snacks made with partially hydrogenated oils, including chips, cheese puffs, snack mixes, regular crackers, butter-flavored popcorn  Protein Foods   Higher-fat cuts of meats (ribs, t-bone steak, regular hamburger) Bacon, sausage, or hot dogs Cold cuts, such as salami or bologna, deli meats, cured meats, corned beef Organ meats (liver, brains, gizzards, sweetbreads) Poultry with skin Fried or smoked meat, poultry, and fish Whole eggs and egg yolks (more than 2-4 per week) Salted legumes, nuts, seeds, or nut/seed butters Meat alternatives with high levels of sodium (>300 mg per serving) or saturated fat (>5 g per serving)  Dairy Whole milk,?2% fat milk, buttermilk Whole milk yogurt or ice cream Cream Half-&-half Cream cheese Sour  cream Cheese  Vegetables Canned or frozen vegetables with salt, fresh vegetables prepared with salt, butter, cheese, or cream sauce Fried vegetables Pickled vegetables such as olives, pickles, or sauerkraut  Fruits Fried fruits Fruits served with butter or cream  Oils Butter, stick margarine, shortening Partially hydrogenated oils or trans fats Tropical oils (coconut, palm, palm kernel oils)  Other Candy, sugar sweetened soft drinks and desserts Salt, sea salt, garlic salt, and seasoning mixes containing salt Bouillon cubes Ketchup, barbecue sauce, Worcestershire sauce, soy sauce, teriyaki sauce Miso Salsa Pickles, olives, relish   Heart Healthy Consistent Carbohydrate Vegetarian (Lacto-Ovo) Sample 1-Day Menu  Breakfast 1 cup oatmeal, cooked (2 carbohydrate servings)   cup blueberries (1 carbohydrate serving)  11 almonds, without salt  1 cup 1% milk (1 carbohydrate serving)  1 cup coffee  Morning Snack 1 cup fat-free plain yogurt (1 carbohydrate serving)  Lunch 1 whole wheat bun (1 carbohydrate servings)  1 black bean burger (1 carbohydrate servings)  1 slice cheddar cheese, low sodium  2 slices tomatoes  2 leaves lettuce  1 teaspoon mustard  1 small pear (1 carbohydrate servings)  1 cup green tea, unsweetened  Afternoon Snack 1/3 cup trail mix with nuts, seeds, and raisins, without salt (1 carbohydrate servinga)  Evening Meal  cup meatless chicken  2/3 cup brown rice, cooked (2 carbohydrate servings)  1 cup broccoli, cooked (2/3 carbohydrate serving)   cup carrots, cooked (1/3 carbohydrate serving)  2 teaspoons olive oil  1 teaspoon balsamic vinegar  1 whole wheat dinner roll (1 carbohydrate serving)  1 teaspoon margarine, soft, tub  1 cup 1% milk (1 carbohydrate serving)  Evening Snack 1 extra small banana (1 carbohydrate serving)  1 tablespoon peanut butter   Heart Healthy Consistent Carbohydrate Vegan Sample 1-Day Menu  Breakfast 1 cup oatmeal, cooked (2  carbohydrate servings)   cup blueberries (1 carbohydrate serving)  11 almonds, without salt  1 cup soymilk fortified with calcium, vitamin B12, and vitamin D  1 cup coffee  Morning Snack 6 ounces soy yogurt (1 carbohydrate servings)  Lunch 1 whole wheat bun(1 carbohydrate servings)  1 black bean burger (1 carbohydrate serving)  2 slices tomatoes  2 leaves lettuce  1 teaspoon mustard  1 small pear (1 carbohydrate servings)  1 cup green tea, unsweetened  Afternoon Snack 1/3 cup trail mix with nuts, seeds, and raisins, without salt (1 carbohydrate servings)  Evening Meal  cup meatless chicken  2/3 cup brown rice, cooked (2 carbohydrate servings)  1 cup broccoli, cooked (2/3 carbohydrate serving)   cup carrots, cooked (1/3 carbohydrate serving)  2 teaspoons olive oil  1 teaspoon balsamic vinegar  1 whole wheat dinner roll (1 carbohydrate serving)  1 teaspoon margarine, soft, tub  1 cup soymilk fortified with calcium, vitamin B12, and vitamin D  Evening Snack 1 extra small banana (1 carbohydrate serving)  1 tablespoon peanut butter    Heart Healthy Consistent Carbohydrate Sample   1-Day Menu  Breakfast 1 cup cooked oatmeal (2 carbohydrate servings)  3/4 cup blueberries (1 carbohydrate serving)  1 ounce almonds  1 cup skim milk (1 carbohydrate serving)  1 cup coffee  Morning Snack 1 cup sugar-free nonfat yogurt (1 carbohydrate serving)  Lunch 2 slices whole-wheat bread (2 carbohydrate servings)  2 ounces lean [place] breast  1 ounce low-fat Swiss cheese  1 teaspoon mustard  1 slice tomato  1 lettuce leaf  1 small pear (1 carbohydrate serving)  1 cup skim milk (1 carbohydrate serving)  Afternoon Snack 1 ounce trail mix with unsalted nuts, seeds, and raisins (1 carbohydrate serving)  Evening Meal 3 ounces salmon  2/3 cup cooked brown rice (2 carbohydrate servings)  1 teaspoon soft margarine  1 cup cooked broccoli with 1/2 cup cooked carrots (1 carbohydrate serving  Carrots,  cooked, boiled, drained, without salt  1 cup lettuce  1 teaspoon olive oil with vinegar for dressing  1 small whole grain roll (1 carbohydrate serving)  1 teaspoon soft margarine  1 cup unsweetened tea  Evening Snack 1 extra-small banana (1 carbohydrate serving)  Copyright 2020  [employer]. All rights reserved.   Corrin Parker, MS, RD, LDN Clinical Dietitian Office phone [phone]

## 2021-12-25 NOTE — Progress Notes (Signed)
Physical Therapy Treatment Patient Details Name: [name]. MRN: [id number] DOB: 1961-04-22 Today's Date: 12/25/2021   History of Present Illness Pt is a 61 y.o. male who presented 12/23/21 with x2-4 week hx of progressive lower extremity edema and SOB. Pt admitted with acute on chronic diastolic CHF and acute COPD exacerbation. PMH: CAD, diastolic CHF, HTN, HLD, IDA, hx of OSA, cocaine abuse, T2DM, hx of CVA, COPD, alcohol abuse, chronic respiratory failure on 2-4L oxygen PRN    PT Comments    The pt was agreeable to session with focus on progression of gait training, attempt to wean O2, and trial of single point cane. Pt agreeable to attempt, reports cane feels like more of a trip hazard to him than useful, pt with poor functional use of cane (carrying it mostly) despite continued cues for technique. Although the pt was able to maintain SpO2 > 93% at rest on RA today, SpO2 dropped to low of 85% and did not recover with standing rest or pursed lip breathing until pt placed on 4L O2. He was then able to maintain in 90s with all gait and stair training. Discussed increased risk of falls and need for O2 with the pt and his son, both in agreement with plan for d/c home with O2 and HHPT to progress endurance and stability.   SpO2 on RA at rest: 93% SpO2 on RA with gait: 85% SpO2 on 4L with gait: 93%    Recommendations for follow up therapy are one component of a multi-disciplinary discharge planning process, led by the attending physician.  Recommendations may be updated based on patient status, additional functional criteria and insurance authorization.  Follow Up Recommendations  Home health PT     Assistance Recommended at Discharge Intermittent Supervision/Assistance  Patient can return home with the following A little help with walking and/or transfers;Assistance with cooking/housework;Assist for transportation;Help with stairs or ramp for entrance   Equipment Recommendations   None recommended by PT (pt declines cane)    Recommendations for Other Services       Precautions / Restrictions Precautions Precautions: Fall;Other (comment) Precaution Comments: watch SpO2; R hemiparesis prior CVA Restrictions Weight Bearing Restrictions: No     Mobility  Bed Mobility Overal bed mobility: Modified Independent             General bed mobility comments: OOB in recliner at start and end of session    Transfers Overall transfer level: Needs assistance Equipment used: None Transfers: Sit to/from Stand Sit to Stand: Supervision           General transfer comment: supervision for safety, pt completing without cane    Ambulation/Gait Ambulation/Gait assistance: Min guard, Min assist Gait Distance (Feet): 300 Feet Assistive device: Straight cane, None Gait Pattern/deviations: Step-through pattern, Decreased stride length, Decreased dorsiflexion - right, Staggering right Gait velocity: reduced     General Gait Details: pt with poor RLE clearance (baseline from stroke) which resulted in x2 LOB to R, pt reports this is baseline. tried cane and pt mostly ambulating without use of cane, states he feels he will trip on it. SpO2 to low of 85% on RA, required 4L O2 to recover and maintain   Stairs Stairs: Yes Stairs assistance: Min guard Stair Management: One rail Right, Alternating pattern, Forwards Number of Stairs: 4 General stair comments: Spo2 stable >95% on 4L     Balance Overall balance assessment: Mild deficits observed, not formally tested  Cognition Arousal/Alertness: Awake/alert Behavior During Therapy: WFL for tasks assessed/performed Overall Cognitive Status: Within Functional Limits for tasks assessed                                 General Comments: pt aware of balance deficits, reluctant to take advice on adjustements to improve safety.        Exercises       General Comments General comments (skin integrity, edema, etc.): SpO2 93% on RA, low f 85% with gait and needed 4L to recover and maintain in 90s.      Pertinent Vitals/Pain Pain Assessment Pain Assessment: No/denies pain     PT Goals (current goals can now be found in the care plan section) Acute Rehab PT Goals Patient Stated Goal: to improve his balance PT Goal Formulation: With patient Time For Goal Achievement: 01/07/22 Potential to Achieve Goals: Good Progress towards PT goals: Progressing toward goals    Frequency    Min 3X/week      PT Plan Current plan remains appropriate       AM-PAC PT "6 Clicks" Mobility   Outcome Measure  Help needed turning from your back to your side while in a flat bed without using bedrails?: None Help needed moving from lying on your back to sitting on the side of a flat bed without using bedrails?: None Help needed moving to and from a bed to a chair (including a wheelchair)?: A Little Help needed standing up from a chair using your arms (e.g., wheelchair or bedside chair)?: A Little Help needed to walk in hospital room?: A Little Help needed climbing 3-5 steps with a railing? : A Little 6 Click Score: 20    End of Session Equipment Utilized During Treatment: Gait belt;Oxygen Activity Tolerance: Patient tolerated treatment well Patient left: in chair;with call bell/phone within reach;with chair alarm set Nurse Communication: Mobility status;Other (comment) PT Visit Diagnosis: Unsteadiness on feet (R26.81);Other abnormalities of gait and mobility (R26.89);Muscle weakness (generalized) (M62.81);Difficulty in walking, not elsewhere classified (R26.2);History of falling (Z91.81)     Time: 1749-4496 PT Time Calculation (min) (ACUTE ONLY): 32 min  Charges:  $Gait Training: 8-22 mins $Therapeutic Exercise: 8-22 mins                     West Carbo, PT, DPT   Acute Rehabilitation Department Pager #: [phone]   Sandra Cockayne 12/25/2021, 5:50 PM

## 2021-12-25 NOTE — Progress Notes (Signed)
Heart Failure Stewardship Pharmacist Progress Note   PCP: Colon Branch, MD PCP-Cardiologist: [name], MD    HPI:  61 yo male with PMH significant for COPD (2-4L home O2), diastolic CHF, G3TD, h/o OSA, IDA, HLD, HTN, h/o CVA and polysubstance abuse (cocaine, tobacco - 1 PPD, alcohol). He presented to [facility] ED 05/16 with dyspnea, chest pain and bilateral LEE in the setting of diuretic noncompliance x80moto avoid frequent urination - prescribed Lasix 20 mg TID and taking once daily.  CXR with cardiomegaly and pulmonary vascular congestion.  Echo demonstrated LVEF 55-60% with mild LVH, G2DD (improved from G3DD on 01/2021 Echo), normal RV, moderately elevated pulmonary pressures, and biatrial dilation.  Started on IV diuresis.  Current HF Medications: Diuretic: s/p furosemide IV 40 mg x3 Aldosterone Antagonist: spironolactone 12.5 mg daily SGLT2i: Farxiga 10 mg daily Other: Imdur 90 mg daily  Prior to admission HF Medications: Diuretic: furosemide 20-40 mg qAM / 20 mg qHS ACE/ARB/ARNI: valsartan 80 mg qHS Aldosterone Antagonist: spironolactone 12.5 mg daily Other: Imdur 90 mg daily  Pertinent Lab Values: Serum creatinine 1.41, up (baseline ~1.1), BUN 31, Potassium 3.8, Sodium 136, BNP 184.6, Magnesium 2.3, A1c 6.8 (11/2021)  Vital Signs: Weight: 228 lbs (admission weight: 231.3 lbs) Blood pressure: 110-30s/80s  Heart rate: 70s  I/O: -1.25 L yesterday; net -3 L  Medication Assistance / Insurance Benefits Check: Does the patient have prescription insurance?  Yes Type of insurance plan: [employer] Medicare   Does the patient qualify for medication assistance through manufacturers or grants?   Yes Eligible grants and/or patient assistance programs: pending Medication assistance applications in progress: pending  Medication assistance applications approved: pending   Outpatient Pharmacy:  Prior to admission [facility] Is the patient  willing to [facility] at discharge? Pending Is the patient willing [facility]?   Pending    Assessment: 1. Acute on chronic diastolic CHF (LVEF 517-61%. NYHA class II-III symptoms. AKI limiting addition/titration of GDMT - Diuretics on hold with bump in SCr - Continue spironolactone 12.5 mg daily - Continue Farxiga 10 mg daily - Continue Imdur 90 mg daily   Plan: 1) Medication changes recommended at this time: - Pending renal function improvement, start Entresto 24-26 mg twice daily  2) Patient assistance: - pending patient discussion - Copays = Entresto $45 / Farxiga $90.77 / Jardiance $45  3)  Education  - To be completed prior to discharge  ELaurey Arrow PharmD PGY1 Pharmacy Resident 12/25/2021  9:07 AM

## 2021-12-25 NOTE — Assessment & Plan Note (Signed)
Calculated BMI is 31,8  

## 2021-12-25 NOTE — Assessment & Plan Note (Addendum)
Patient developed AKI related to diuresis. At the time of his discharge his volume has improved, and his serum cr is trending down.  At discharge cr at 1,39, K 4,1, CL 102 and serum bicarbonate at 27.  Continue diuresis with furosemide at home.

## 2021-12-25 NOTE — Progress Notes (Addendum)
Progress Note   Patient: [name]. PPI:[id number] DOB: 1961/08/08 DOA: 12/23/2021     1 DOS: the patient was seen and examined on 12/25/2021   Brief hospital course: Mr. Desaulniers was admitted to the hospital with the working diagnosis of decompensated heart failure  61 yo male with the past medical history of coronary artery disease, diastolic heart failure, hypertension, dyslipidemia, T2DM, CVA, COPD, alcohol and cocaine abuse who presented with dyspnea and edema. Reported 2 to 4 weeks of worsening lower extremity edema and dyspnea, to the point where he was symptomatic with minimal efforts. Positive orthopnea, PND and weigh gain (about 3 lbs). As outpatient he was instructed to increase dose of diuretic with no improvement in his symptoms. He has been not compliant with medical therapy at home. On his initial physical examination his blood pressure was 132/72, HR 84, RR 20 and 02 saturation 96%, lungs with occasional wheeze and bilateral coarse breath sounds, heart with S1 and S2 present and rhythmic, abdomen not distended and positive lower extremity edema ++ pitting.    Na 130, K 4,1 CL 103, bicarbonate at 27 glucose 108 bun 20 cr 1,13  BNP 184  High sensitive troponin 20 and 20  Wbc 7,1 hgb 15 plt 216   Chest radiograph with cardiomegaly with bilateral hilar vascular congestion and bilateral interstitial infiltrates at the lower lobes bilaterally, more on the left.   EKG 68 bpm, right axis deviation, right bundle branch block, sinus rhythm, with no significant ST segment or T wave changes.   Patient was placed on furosemide for diuresis with improvement in volume status. Developed renal failure that has delayed his discharge.   Assessment and Plan: * Acute on chronic diastolic heart failure (HCC) Echocardiogram with preserved LV systolic function 55 to 06% with mild LVH. Right ventricle systolic function is preserved. Moderate elevation in pulmonary artery systolic pressure.  Moderate dilatation of bilateral atriums. No significant valvular disease.   Urine output is 2.250 ml over last 24 hrs.  Systolic blood pressure is 117 mmHg.   Continue with spironolactone and SGLT2i Plan to resume valsartan at the time of discharge and transition to [facility] as outpatient.     COPD exacerbation (HCC) Improved dyspnea. Plan to continue bronchodilator therapy with duoneb and inhaled corticosteroids.  Continue oxymetry monitoring.   Type 2 diabetes mellitus with hyperlipidemia (HCC) Fasting glucose this am is 115 mg/dl plant to continue glucose cover and monitoring.   Continue with statin therapy.   Essential hypertension Continue with spironolactone and isosorbide. Resume ARB at the time of discharge.  Patient has been no compliant with medical therapy at home.    Chronic respiratory failure with hypoxia (HCC) Contineu oxymetry monitoring and supplmental 02 per [place]   CAD (coronary artery disease) He is s/p 3V CABG in March 2010 and has had bare metal stent to SVG to Circumflex in November 2010 as well as DES placed in the body of the SVG to OM in November 2017  cath 09/2017, patent grafts  Continue with aspirin, clopidogrel and close blood pressure control.  Continue with isosorbide.   Cigarette smoker Smokes 1PPD Nicotine patch   OSA (obstructive sleep apnea) ?? Continue cpap at night, poorly compliant with this at home   GERD (gastroesophageal reflux disease) protonix BID   Alcohol use No clinical signs of withdrawal, will hold on CIWA for now and will add as needed oral lorazepam in case of anxiety.   Class 1 obesity Calculated BMI is 31,8  AKI (acute kidney injury) ([place]) Renal function with serum cr at 1,41 with K at 3,8 and serum bicarbonate at 27  Plan to hold on diuretic therapy and follow up renal function in am.  Avoid hypotension and nephrotoxic medications,         Subjective: Patient with improvement in dyspnea and edema no  chest pain   Physical Exam: Vitals:   12/25/21 0027 12/25/21 0523 12/25/21 0842 12/25/21 1205  BP: 136/80 118/67  117/70  Pulse: 78 76    Resp: '16 18  20  '$ Temp:  98 F (36.7 C)  98.6 F (37 C)  TempSrc:  Oral  Oral  SpO2: 96% 96% 93% 91%  Weight:  103.6 kg    Height:       Neurology awake and alert ENT with no pallor Cardiovascular with S1 and S2 present and rhythmic with no gallops, rubs or murmurs Respiratory with no wheezing, no rales or rhonchi, positive prolonged expiratory phase Abdomen with no distention  No lower extremity edema  Data Reviewed:    Family Communication: no family at the bedside   Disposition: Status is: Inpatient Remains inpatient appropriate because: renal failure   Planned Discharge Destination: Home      Author: Tawni Millers, MD 12/25/2021 1:16 PM  For on call review [email or web address].

## 2021-12-25 NOTE — Progress Notes (Signed)
Heart Failure Navigation Team Progress Note  PCP: Colon Branch, MD Primary Cardiologist: Burnell Blanks, MD Admitted from: home  Past Medical History:  Diagnosis Date   (HFpEF) heart failure with preserved ejection fraction ([place])    Echo 6/22: EF 55-60, no RWMA, GR 3 DD, elevated LVEDP, normal RVSF, RVSP 53.2, mild to moderate AV sclerosis without stenosis, dilated aortic root (41 mm), ascending aorta 37 mm   Abnormality of gait    Anxiety    CAD (coronary artery disease)    a. 10/2008 Inferolateral STEMI: 3VD w/ occluded LCX (PTCA)-->CABG x 3 (LIMA->LAD, VG->OM, VG->PDA);  b. 06/2009 NSTEMI/PCI: VG->OM 100 (BMS);  c. 05/2013 native 3VD, 3/3 patent grafts. d. 06/2016: Inferior STEMI w/ occluded SVG-OM (DES placed). LIMA-LAD and SVG-PDA patent.   Chronic bronchitis (HCC)    Chronic diastolic CHF (congestive heart failure) ([place])    a. 02/2015 Echo: EF 50-55%, distal septal, apical, inferobasal HK, mild LVH, mild MR, mildly dil LA.   Cocaine use    Colon polyps    a. 09/2015 colonscopy: multiple sessile polyps, path negative for high grade dysplasia.   Depression    Emphysema (subcutaneous) (surgical) resulting from a procedure 10/2008.   Bleb resected at time of 10/2008 CABG. Marketed emphysema noted on surgical report.   Esophagitis    a. 2017 EGD: esophagitis, duodenitis.   ETOH abuse    GERD (gastroesophageal reflux disease)    Hemianopia, homonymous, right    "can't see out the right side of either eye since stroke in 2016/pt description 09/23/2016   Hepatic steatosis    Hiatal hernia    History of nuclear stress test 08/2017   Nuclear stress test 1/19: EF 35, inf-lat and ant-lat, apical sept/apical scar; no ischemia; Intermediate Risk   Hyperlipidemia    Hypertension    Iron deficiency anemia 2010   Morbid obesity ([place])    Myocardial infarction [facility]) "several"   PAD (peripheral artery disease) ([place])    Pancreatitis 06/2015   Sleep apnea    "don't have a mask"  (09/23/2016)   Stroke ([place]) 02/2015   a. 02/2015 Carotid U/S: 1-39% bilat ICA stenosis; "left me blind" (09/23/2016)   Tobacco abuse    still smokes   Tubular adenoma of colon     Social History   Socioeconomic History   Marital status: Single    Spouse name: Not on file   Number of children: 2   Years of education: Not on file   Highest education level: High school graduate  Occupational History   Occupation: not working , used to be a Chief Executive Officer    Occupation: disable  Tobacco Use   Smoking status: Every Day    Packs/day: 0.50    Years: 45.00    Pack years: 22.50    Types: Cigarettes    Start date: 1976   Smokeless tobacco: Never   Tobacco comments:    1 pack daily- wants to quit  Vaping Use   Vaping Use: Never used  Substance and Sexual Activity   Alcohol use: Yes    Alcohol/week: 3.0 standard drinks    Types: 3 Cans of beer per week    Comment: 3 beers per day   Drug use: Yes    Types: Marijuana    Comment: quit 5-10 years ago   Sexual activity: Not Currently  Other Topics Concern   Not on file  Social History Narrative   Lives by himself   "Sister Alyse Low,  lives in  [place], she is willing to help her brother, she will be checking MyCchart messages and coordinating his appointments.  She can be reached at [phone]".   Social Determinants of Health   Financial Resource Strain: Low Risk    Difficulty of Paying Living Expenses: Not very hard  Food Insecurity: No Food Insecurity   Worried About Charity fundraiser in the Last Year: Never true   Ran Out of Food in the Last Year: Never true  Transportation Needs: No Transportation Needs   Lack of Transportation (Medical): No   Lack of Transportation (Non-Medical): No  Physical Activity: Not on file  Stress: Not on file  Social Connections: Not on file     Heart & Vascular Transition of Care Clinic follow-up: Scheduled for 01/07/22.  Confirmed transportation.  Immediate social needs: Medicaid  assistance and verification if he has Medicaid.  HF CSW spoke with Mr. Senn at bedside to discuss about if he has Medicaid or not as he received Medicaid cards in the mail. CSW reached out to [employer] to see about the Medicaid and they reported that it is family Medicaid planning only and will not help with his hospital bills.  Nyomie Ehrlich, MSW, LCSW [phone] Heart Failure Social Worker

## 2021-12-25 NOTE — Progress Notes (Signed)
Heart Failure Nurse Navigator Progress Note  PCP: Colon Branch, MD PCP-Cardiologist: Angelena Form Admission Diagnosis: Acute on chronic diastolic heart failure, COPD exacerbation Admitted from: Home  Presentation:   [name]. presented with chest pain, shortness of breath and 1-2 + pitting lower extremity edema. Takes lasix 3x per day, uses oxygen at night time. Smoking history, drinks 1-2 beers/night BP 123/69, BNP 184.6, Troponin 20, IV lasix given, and admitted.   Patient was educated on the sign and symptoms of heart failure, daily weights, when to call his doctor or go to the ER. Diet/ fluid restrictions, importance of taking all his medications as prescribed, and keep his medical appointments, we discussed the importance of smoking cessation . Patient verbalized his understanding of the education, he is scheduled for a hospital follow up with HF TOC on 01/07/22.   ECHO/ LVEF: 55-60% G3DD HFpEF  Clinical Course:  Past Medical History:  Diagnosis Date   (HFpEF) heart failure with preserved ejection fraction ([place])    Echo 6/22: EF 55-60, no RWMA, GR 3 DD, elevated LVEDP, normal RVSF, RVSP 53.2, mild to moderate AV sclerosis without stenosis, dilated aortic root (41 mm), ascending aorta 37 mm   Abnormality of gait    Anxiety    CAD (coronary artery disease)    a. 10/2008 Inferolateral STEMI: 3VD w/ occluded LCX (PTCA)-->CABG x 3 (LIMA->LAD, VG->OM, VG->PDA);  b. 06/2009 NSTEMI/PCI: VG->OM 100 (BMS);  c. 05/2013 native 3VD, 3/3 patent grafts. d. 06/2016: Inferior STEMI w/ occluded SVG-OM (DES placed). LIMA-LAD and SVG-PDA patent.   Chronic bronchitis (HCC)    Chronic diastolic CHF (congestive heart failure) ([place])    a. 02/2015 Echo: EF 50-55%, distal septal, apical, inferobasal HK, mild LVH, mild MR, mildly dil LA.   Cocaine use    Colon polyps    a. 09/2015 colonscopy: multiple sessile polyps, path negative for high grade dysplasia.   Depression    Emphysema (subcutaneous)  (surgical) resulting from a procedure 10/2008.   Bleb resected at time of 10/2008 CABG. Marketed emphysema noted on surgical report.   Esophagitis    a. 2017 EGD: esophagitis, duodenitis.   ETOH abuse    GERD (gastroesophageal reflux disease)    Hemianopia, homonymous, right    "can't see out the right side of either eye since stroke in 2016/pt description 09/23/2016   Hepatic steatosis    Hiatal hernia    History of nuclear stress test 08/2017   Nuclear stress test 1/19: EF 35, inf-lat and ant-lat, apical sept/apical scar; no ischemia; Intermediate Risk   Hyperlipidemia    Hypertension    Iron deficiency anemia 2010   Morbid obesity ([place])    Myocardial infarction [facility]) "several"   PAD (peripheral artery disease) ([place])    Pancreatitis 06/2015   Sleep apnea    "don't have a mask" (09/23/2016)   Stroke ([place]) 02/2015   a. 02/2015 Carotid U/S: 1-39% bilat ICA stenosis; "left me blind" (09/23/2016)   Tobacco abuse    still smokes   Tubular adenoma of colon      Social History   Socioeconomic History   Marital status: Single    Spouse name: Not on file   Number of children: 2   Years of education: Not on file   Highest education level: High school graduate  Occupational History   Occupation: not working , used to be a Chief Executive Officer    Occupation: disable  Tobacco Use   Smoking status: Every Day    Packs/day:  0.50    Years: 45.00    Pack years: 22.50    Types: Cigarettes    Start date: 1976   Smokeless tobacco: Never   Tobacco comments:    1 pack daily- wants to quit  Vaping Use   Vaping Use: Never used  Substance and Sexual Activity   Alcohol use: Yes    Alcohol/week: 3.0 standard drinks    Types: 3 Cans of beer per week    Comment: 3 beers per day   Drug use: Yes    Types: Marijuana    Comment: quit 5-10 years ago   Sexual activity: Not Currently  Other Topics Concern   Not on file  Social History Narrative   Lives by himself   "Pricilla Riffle,  lives in  [place], she is willing to help her brother, she will be checking MyCchart messages and coordinating his appointments.  She can be reached at [phone]".   Social Determinants of Health   Financial Resource Strain: Low Risk    Difficulty of Paying Living Expenses: Not very hard  Food Insecurity: No Food Insecurity   Worried About Charity fundraiser in the Last Year: Never true   Ran Out of Food in the Last Year: Never true  Transportation Needs: No Transportation Needs   Lack of Transportation (Medical): No   Lack of Transportation (Non-Medical): No  Physical Activity: Not on file  Stress: Not on file  Social Connections: Not on file   Education Assessment and Provision:  Detailed education and instructions provided on heart failure disease management including the following:  Signs and symptoms of Heart Failure When to call the physician Importance of daily weights Low sodium diet Fluid restriction Medication management Anticipated future follow-up appointments  Patient education given on each of the above topics.  Patient acknowledges understanding via teach back method and acceptance of all instructions.  Education Materials:  "Living Better With Heart Failure" Booklet, HF zone tool, & Daily Weight Tracker Tool.  Patient has scale at home: yes Patient has pill box at home: NA    High Risk Criteria for Readmission and/or Poor Patient Outcomes: Heart failure hospital admissions (last 6 months): 1  No Show rate: 19 % Difficult social situation: No Demonstrates medication adherence: No Primary Language: English Literacy level: Reading, writing, and comprehension.  Barriers of Care:   Medication compliance Fluid/ diet restrictions Daily weights  Considerations/Referrals:   Referral made to Heart Failure Pharmacist Stewardship: yes Referral made to Heart Failure CSW/NCM TOC: No Referral made to Heart & Vascular TOC clinic: yes, 01/07/22  Items for Follow-up on  DC/TOC: Medication compliance Diet/ fluid restrictions Daily weights   Earnestine Leys, BSN, RN Heart Failure Transport planner Only

## 2021-12-25 NOTE — Progress Notes (Signed)
Initial Nutrition Assessment  DOCUMENTATION CODES:   Not applicable  INTERVENTION:  Provide Ensure Enlive po BID, each supplement provides 350 kcal and 20 grams of protein.  Encourage adequate PO intake.   NUTRITION DIAGNOSIS:   Increased nutrient needs related to chronic illness (CHF, COPD) as evidenced by estimated needs.  GOAL:   Patient will meet greater than or equal to 90% of their needs  MONITOR:   PO intake, Supplement acceptance, Labs, Weight trends, Skin, I & O's  REASON FOR ASSESSMENT:   Consult Assessment of nutrition requirement/status  ASSESSMENT:   61 yo male with the past medical history of coronary artery disease, diastolic heart failure, hypertension, dyslipidemia, T2DM, CVA, COPD, alcohol and cocaine abuse who presented with dyspnea and edema. Pt with acute on chronic diastolic heart failure.  Meal completion has been varied from 25-100%. Pt unavailable during attempted time of visit. RD to order nutritional supplements to aid in caloric and protein needs. Diet handout regarding a heart healthy, consistent carbohydrate diet from the Academy of Nutrition and Dietetics Manual placed in pt discharge instructions.  Unable to complete Nutrition-Focused physical exam at this time.   Labs and medications reviewed.   Diet Order:   Diet Order             Diet regular Room service appropriate? Yes; Fluid consistency: Thin; Fluid restriction: 1500 mL Fluid  Diet effective now                   EDUCATION NEEDS:   Education needs have been addressed  Skin:  Skin Assessment: Reviewed RN Assessment  Last BM:  5/17  Height:   Ht Readings from Last 1 Encounters:  12/23/21 '5\' 11"'$  (1.803 m)    Weight:   Wt Readings from Last 1 Encounters:  12/25/21 103.6 kg   BMI:  Body mass index is 31.84 kg/m.  Estimated Nutritional Needs:   Kcal:  2100-2300  Protein:  105-115 grams  Fluid:  1.5 L/day  Kenneth Parker, MS, RD, LDN RD pager  number/after hours weekend pager number on Amion.

## 2021-12-25 NOTE — Progress Notes (Signed)
Pt placed on CPAP for night rest after his PRN neb treatment.  No distress noted patient tolerating well.

## 2021-12-26 ENCOUNTER — Other Ambulatory Visit (HOSPITAL_COMMUNITY): Payer: Self-pay

## 2021-12-26 ENCOUNTER — Telehealth: Payer: Self-pay | Admitting: Pharmacist

## 2021-12-26 DIAGNOSIS — I10 Essential (primary) hypertension: Secondary | ICD-10-CM | POA: Diagnosis not present

## 2021-12-26 DIAGNOSIS — I5033 Acute on chronic diastolic (congestive) heart failure: Secondary | ICD-10-CM | POA: Diagnosis not present

## 2021-12-26 DIAGNOSIS — F17210 Nicotine dependence, cigarettes, uncomplicated: Secondary | ICD-10-CM

## 2021-12-26 DIAGNOSIS — E1169 Type 2 diabetes mellitus with other specified complication: Secondary | ICD-10-CM | POA: Diagnosis not present

## 2021-12-26 DIAGNOSIS — J441 Chronic obstructive pulmonary disease with (acute) exacerbation: Secondary | ICD-10-CM | POA: Diagnosis not present

## 2021-12-26 LAB — BASIC METABOLIC PANEL
Anion gap: 8 (ref 5–15)
BUN: 32 mg/dL — ABNORMAL HIGH (ref 6–20)
CO2: 27 mmol/L (ref 22–32)
Calcium: 9.2 mg/dL (ref 8.9–10.3)
Chloride: 102 mmol/L (ref 98–111)
Creatinine, Ser: 1.39 mg/dL — ABNORMAL HIGH (ref 0.61–1.24)
GFR, Estimated: 58 mL/min — ABNORMAL LOW (ref >60)
Glucose, Bld: 98 mg/dL (ref 70–99)
Potassium: 4.1 mmol/L (ref 3.5–5.1)
Sodium: 137 mmol/L (ref 135–145)

## 2021-12-26 LAB — GLUCOSE, CAPILLARY: Glucose-Capillary: 109 mg/dL — ABNORMAL HIGH (ref 70–99)

## 2021-12-26 LAB — MAGNESIUM: Magnesium: 2.3 mg/dL (ref 1.7–2.4)

## 2021-12-26 MED ORDER — ENSURE ENLIVE PO LIQD
237.0000 mL | Freq: Two times a day (BID) | ORAL | 0 refills | Status: AC
  Filled 2021-12-26: qty 14220, 30d supply, fill #0

## 2021-12-26 MED ORDER — VALSARTAN 40 MG PO TABS
40.0000 mg | ORAL_TABLET | Freq: Every day | ORAL | 0 refills | Status: DC
  Filled 2021-12-26: qty 15, 30d supply, fill #0
  Filled 2021-12-26: qty 30, 30d supply, fill #0

## 2021-12-26 MED ORDER — FUROSEMIDE 40 MG PO TABS: 60.0000 mg | ORAL_TABLET | Freq: Every day | ORAL | Status: DC

## 2021-12-26 MED ORDER — EMPAGLIFLOZIN 10 MG PO TABS
10.0000 mg | ORAL_TABLET | Freq: Every day | ORAL | 0 refills | Status: DC
  Filled 2021-12-26: qty 30, 30d supply, fill #0

## 2021-12-26 MED ORDER — CERTAVITE/ANTIOXIDANTS PO TABS
1.0000 | ORAL_TABLET | Freq: Every day | ORAL | 0 refills | Status: AC
  Filled 2021-12-26: qty 30, 30d supply, fill #0

## 2021-12-26 MED ORDER — FUROSEMIDE 20 MG PO TABS
60.0000 mg | ORAL_TABLET | Freq: Every day | ORAL | 0 refills | Status: DC
  Filled 2021-12-26: qty 30, 10d supply, fill #0

## 2021-12-26 MED ORDER — EMPAGLIFLOZIN 10 MG PO TABS: 10.0000 mg | ORAL_TABLET | Freq: Every day | ORAL | Status: DC

## 2021-12-26 NOTE — Telephone Encounter (Signed)
I spoke with patient regarding med changes. He had just gotten home from hospital. Endorsed that he had received new medications at discharge. Provided education regarding med changes, expected outcomes and side effects to monitor. He voiced understanding. Will check in with phone visit 12/31/2021 to review and answer any questions about new regimen.

## 2021-12-26 NOTE — Discharge Summary (Signed)
Physician Discharge Summary   Patient: [name]. MRN: [id number] DOB: [birth year]  Admit date:     12/23/2021  Discharge date: 12/26/21  Discharge Physician: Jimmy Picket Aparna Vanderweele   PCP: Colon Branch, MD   Recommendations at discharge:    Change furosemide to 60 mg daily Added empagliflizin  Reduced dose of valsartan to 40 mg daily Patient was advised about compliance with medical therapy and dietary restrictions in the setting of heart failure Follow up renal function in 7 days as outpatient.   Discharge Diagnoses: Principal Problem:   Acute on chronic diastolic heart failure (HCC) Active Problems:   COPD exacerbation (HCC)   Type 2 diabetes mellitus with hyperlipidemia (HCC)   Essential hypertension   Chronic respiratory failure with hypoxia (HCC)   CAD (coronary artery disease)   Cigarette smoker   OSA (obstructive sleep apnea) ??   GERD (gastroesophageal reflux disease)   Alcohol use   Class 1 obesity   AKI (acute kidney injury) ([place])  Resolved Problems:   * No resolved hospital problems. [facility] Course: Mr. Ballin was admitted to the hospital with the working diagnosis of decompensated heart failure  61 yo male with the past medical history of coronary artery disease, diastolic heart failure, hypertension, dyslipidemia, T2DM, CVA, COPD, alcohol and cocaine abuse who presented with dyspnea and edema. Reported 2 to 4 weeks of worsening lower extremity edema and dyspnea, to the point where he was symptomatic with minimal efforts. Positive orthopnea, PND and weigh gain (about 3 lbs). As outpatient he was instructed to increase dose of diuretic with no improvement in his symptoms. He has been not compliant with medical therapy at home. On his initial physical examination his blood pressure was 132/72, HR 84, RR 20 and 02 saturation 96%, lungs with occasional wheeze and bilateral coarse breath sounds, heart with S1 and S2 present and rhythmic, abdomen not distended  and positive lower extremity edema ++ pitting.    Na 130, K 4,1 CL 103, bicarbonate at 27 glucose 108 bun 20 cr 1,13  BNP 184  High sensitive troponin 20 and 20  Wbc 7,1 hgb 15 plt 216   Chest radiograph with cardiomegaly with bilateral hilar vascular congestion and bilateral interstitial infiltrates at the lower lobes bilaterally, more on the left.   EKG 68 bpm, right axis deviation, right bundle branch block, sinus rhythm, with no significant ST segment or T wave changes.   Patient was placed on furosemide for diuresis with improvement in volume status. Developed renal failure that has delayed his discharge.   At the time of his discharge his renal function is improving, plan to follow up as outpatient.   Assessment and Plan: * Acute on chronic diastolic heart failure ([place]) Patient was admitted to the cardiac ward, he was placed on IV furosemide for diuresis, negative fluid balance was achieved, - 3,208, with improvement in his symptoms.   Echocardiogram with preserved LV systolic function 55 to 73% with mild LVH. Right ventricle systolic function is preserved. Moderate elevation in pulmonary artery systolic pressure. Moderate dilatation of bilateral atriums. No significant valvular disease.   Patient will continue medical therapy with spironolactone and SGLT2i Resume valsartan at discharge.     COPD exacerbation ([place]) Patient was placed on supplemental 02, short course of systemic steroids and bronchodilator therapy.   Symptoms have improved, patient will continue bronchodilator therapy and supplemental 02 at home.   Type 2 diabetes mellitus with hyperlipidemia (HCC) His glucose remained stable during his  hospitalization, Patient was placed on insulin sliding scale for glucose cover and monitoring.   Continue with statin therapy.   Essential hypertension Continue with spironolactone and isosorbide. Resume valsartan at the time of discharge 40 mg daily with close blood  pressure monitoring.   Patient has been no compliant with medical therapy at home.    Chronic respiratory failure with hypoxia (HCC) Contineu oxymetry monitoring and supplmental 02 per [place]   CAD (coronary artery disease) He is s/p 3V CABG in March 2010 and has had bare metal stent to SVG to Circumflex in November 2010 as well as DES placed in the body of the SVG to OM in November 2017  cath 09/2017, patent grafts  Continue with aspirin, clopidogrel and close blood pressure control.  Continue with isosorbide.   Cigarette smoker Smokes 1PPD Nicotine patch   OSA (obstructive sleep apnea) ?? Continue cpap at night, poorly compliant with this at home   GERD (gastroesophageal reflux disease) Continue proton pump inhibitors.   Alcohol use No clinical signs of withdrawal.   Class 1 obesity Calculated BMI is 31,8   AKI (acute kidney injury) ([place]) Patient developed AKI related to diuresis. At the time of his discharge his volume has improved, and his serum cr is trending down.  At discharge cr at 1,39, K 4,1, CL 102 and serum bicarbonate at 27.  Continue diuresis with furosemide at home.          Consultants: none  Procedures performed: non   Disposition: Home Diet recommendation:  Discharge Diet Orders (From admission, onward)     Start     Ordered   12/26/21 0000  Diet - low sodium heart healthy        12/26/21 0913           Cardiac and Carb modified diet DISCHARGE MEDICATION: Allergies as of 12/26/2021       Reactions   Bupropion Nausea Only   Metformin And Related Diarrhea, Other (See Comments)   Severe diarrhea        Medication List     TAKE these medications    aspirin EC 81 MG tablet Take 1 tablet (81 mg total) by mouth daily with breakfast.   atorvastatin 80 MG tablet Commonly known as: LIPITOR TAKE 1 TABLET EVERY DAY AT 6:00PM What changed: See the new instructions.   augmented betamethasone dipropionate 0.05 % cream Commonly known as:  DIPROLENE-AF APPLY TOPICALLY TWICE DAILY What changed: how much to take   budesonide-formoterol 160-4.5 MCG/ACT inhaler Commonly known as: SYMBICORT Inhale 2 puffs into the lungs 2 (two) times daily. What changed:  how much to take when to take this additional instructions   clopidogrel 75 MG tablet Commonly known as: PLAVIX TAKE 1 TABLET EVERY DAY What changed: when to take this   diclofenac Sodium 1 % Gel Commonly known as: VOLTAREN Apply 1 application. topically daily as needed for pain.   empagliflozin 10 MG Tabs tablet Commonly known as: JARDIANCE Take 1 tablet (10 mg total) by mouth daily.   ezetimibe 10 MG tablet Commonly known as: ZETIA Take 1 tablet (10 mg total) by mouth daily. What changed: when to take this   feeding supplement Liqd Take 237 mLs by mouth 2 (two) times daily between meals.   fenofibrate 145 MG tablet Commonly known as: TRICOR TAKE 1 TABLET EVERY DAY What changed: when to take this   folic acid 1 MG tablet Commonly known as: FOLVITE Take 1 tablet (1 mg total) by mouth  daily.   furosemide 20 MG tablet Commonly known as: LASIX Take 3 tablets (60 mg total) by mouth daily. Start taking on: Dec 27, 2021 What changed:  how much to take when to take this additional instructions   gabapentin 100 MG capsule Commonly known as: NEURONTIN Take 2 capsules (200 mg total) by mouth 3 (three) times daily. What changed:  how much to take when to take this additional instructions   isosorbide mononitrate 60 MG 24 hr tablet Commonly known as: IMDUR Take 1.5 tablets (90 mg total) by mouth daily.   multivitamin with minerals Tabs tablet Take 1 tablet by mouth daily.   nitroGLYCERIN 0.4 MG SL tablet Commonly known as: Nitrostat Place 1 tablet (0.4 mg total) under the tongue every 5 (five) minutes x 3 doses as needed for chest pain.   OneTouch Delica Lancets 33P Misc Use to check blood glucose once a day (DX: E11.51 type 2 DM)   OneTouch  Verio Reflect w/Device Kit Use to check blood glucose daily (DX: type 2 DM - E 11.51)   OneTouch Verio test strip Generic drug: glucose blood Use to check blood glucose once a day (DX: 11.51 - type 2 DM)   OXYGEN Inhale 2-4 L/min into the lungs as needed (for shortness of breath).   pantoprazole 40 MG tablet Commonly known as: PROTONIX TAKE 1 TABLET TWICE DAILY What changed: when to take this   PRESCRIPTION MEDICATION CPAP- At bedtime   spironolactone 25 MG tablet Commonly known as: ALDACTONE Take 0.5 tablets (12.5 mg total) by mouth daily.   tamsulosin 0.4 MG Caps capsule Commonly known as: FLOMAX Take 1 capsule (0.4 mg total) by mouth in the morning and at bedtime.   thiamine 100 MG tablet Take 1 tablet (100 mg total) by mouth daily.   traZODone 100 MG tablet Commonly known as: DESYREL Take 1 tablet (100 mg total) by mouth at bedtime. What changed: how much to take   valsartan 80 MG tablet Commonly known as: Diovan Take 0.5 tablets (40 mg total) by mouth daily. What changed: how much to take   Vitamin B-12 5000 MCG Subl Place 5,000 mcg under the tongue daily.   Vitamin D-3 125 MCG (5000 UT) Tabs Take 5,000 Units by mouth daily.               Durable Medical Equipment  (From admission, onward)           Start     Ordered   12/24/21 1628  For home use only DME Other see comment  Once       Comments: For POC eval  Question:  Length of Need  Answer:  Lifetime   12/24/21 1627            Follow-up [place] up.   Specialty: [place] Why: HHRN,HHPT,HHOT- Agency will contacti you to coordiante apt times. Contact information: [place] [address] [address]. Go in 12 day(s).   Specialty: Cardiology Why: Hospital follow up PLEASE bring current medication list FREE valet parking, Entrance  C, off [employer] information: [address] [id number] [place] [phone]               Discharge Exam: [place] Weights   12/24/21 0043 12/25/21 0523 12/26/21 0444  Weight: 103.7  kg 103.6 kg 104.1 kg   BP 128/74 (BP Location: Right Arm)   Pulse 68   Temp 98 F (36.7 C) (Oral)   Resp 18   Ht '5\' 11"'  (1.803 m)   Wt 104.1 kg Comment: scale c  SpO2 (!) 86%   BMI 31.99 kg/m   Patient is feeling better, dyspnea and edema have improved.   Neurology awake and alert ENT with no pallor Cardiovascular with S1 and S2 present and rhythmic with no gallops, rubs or murmurs No JVD No lower extremity edema Respiratory with no rales or wheezing Abdomen not distended   Condition at discharge: stable  The results of significant diagnostics from this hospitalization (including imaging, microbiology, ancillary and laboratory) are listed below for reference.   Imaging Studies: DG Chest 2 View  Result Date: 12/23/2021 CLINICAL DATA:  Shortness of breath EXAM: CHEST - 2 VIEW COMPARISON:  Chest x-ray 08/05/2021 FINDINGS: Heart is enlarged. Mediastinum appears stable. Cardiac surgical changes and median sternotomy wires. Pulmonary vascular congestion with no focal consolidation. No pleural effusion or pneumothorax identified. IMPRESSION: Cardiomegaly and pulmonary vascular congestion. Electronically Signed   By: Ofilia Neas [name]   On: 12/23/2021 13:09   ECHOCARDIOGRAM COMPLETE  Result Date: 12/24/2021    ECHOCARDIOGRAM REPORT   Patient Name:   Ercole Georg. Date of Exam: 12/24/2021 Medical Rec #:  [id number]            Height:       71.0 in Accession #:    [id number]           Weight:       228.6 lb Date of Birth:  Apr 01, 1961            BSA:          2.232 m Patient Age:    61 years             BP:           120/74 mmHg Patient Gender: M                    HR:           78 bpm. Exam Location:  Inpatient Procedure: 2D Echo, Cardiac  Doppler, Color Doppler and Intracardiac            Opacification Agent Indications:    CHF  History:        Patient has prior history of Echocardiogram examinations, most                 recent 01/10/2021. CHF, CAD, COPD; Risk Factors:Hypertension,                 Sleep Apnea and Diabetes.  Sonographer:    Jefferey Pica Referring Phys: [id number] ALLISON WOLFE  Sonographer Comments: Technically difficult due to respiratory interference and patients persistent coughing. IMPRESSIONS  1. Left ventricular ejection fraction, by estimation, is 55 to 60%. The left ventricle has normal function. The left ventricle has no regional wall motion abnormalities. There is mild left ventricular hypertrophy. Left ventricular diastolic parameters are consistent with Grade II diastolic dysfunction (pseudonormalization).  2. Right ventricular systolic function is normal. The right ventricular size is normal. There is moderately elevated pulmonary artery systolic pressure.  3. Left atrial size was moderately dilated.  4. Right atrial size was moderately dilated.  5. The mitral valve is grossly normal. Trivial mitral valve regurgitation. No evidence of mitral stenosis.  6. The aortic valve is  grossly normal. There is mild calcification of the aortic valve. Aortic valve regurgitation is not visualized. Aortic valve sclerosis/calcification is present, without any evidence of aortic stenosis.  7. The inferior vena cava is dilated in size with >50% respiratory variability, suggesting right atrial pressure of 8 mmHg. Comparison(s): No significant change from prior study. Conclusion(s)/Recommendation(s): Grade 2 diastolic dysfunction, moderately elevated PA pressures. FINDINGS  Left Ventricle: Left ventricular ejection fraction, by estimation, is 55 to 60%. The left ventricle has normal function. The left ventricle has no regional wall motion abnormalities. Definity contrast agent was given IV to delineate the left ventricular  endocardial  borders. The left ventricular internal cavity size was normal in size. There is mild left ventricular hypertrophy. Left ventricular diastolic parameters are consistent with Grade II diastolic dysfunction (pseudonormalization). Right Ventricle: The right ventricular size is normal. Right vetricular wall thickness was not well visualized. Right ventricular systolic function is normal. There is moderately elevated pulmonary artery systolic pressure. The tricuspid regurgitant velocity is 3.24 m/s, and with an assumed right atrial pressure of 8 mmHg, the estimated right ventricular systolic pressure is 07.6 mmHg. Left Atrium: Left atrial size was moderately dilated. Right Atrium: Right atrial size was moderately dilated. Pericardium: There is no evidence of pericardial effusion. Mitral Valve: The mitral valve is grossly normal. Mild to moderate mitral annular calcification. Trivial mitral valve regurgitation. No evidence of mitral valve stenosis. Tricuspid Valve: The tricuspid valve is normal in structure. Tricuspid valve regurgitation is mild . No evidence of tricuspid stenosis. Aortic Valve: The aortic valve is grossly normal. There is mild calcification of the aortic valve. Aortic valve regurgitation is not visualized. Aortic valve sclerosis/calcification is present, without any evidence of aortic stenosis. Aortic valve peak gradient measures 13.7 mmHg. Pulmonic Valve: The pulmonic valve was not well visualized. Pulmonic valve regurgitation is trivial. No evidence of pulmonic stenosis. Aorta: The aortic root, ascending aorta and aortic arch are all structurally normal, with no evidence of dilitation or obstruction. Venous: The inferior vena cava is dilated in size with greater than 50% respiratory variability, suggesting right atrial pressure of 8 mmHg. IAS/Shunts: The atrial septum is grossly normal.  LEFT VENTRICLE PLAX 2D LVIDd:         5.70 cm Diastology LVIDs:         4.70 cm LV e' medial:    8.40 cm/s LV PW:          1.20 cm LV E/e' medial:  11.9 LV IVS:        1.40 cm LV e' lateral:   11.30 cm/s                        LV E/e' lateral: 8.8  IVC IVC diam: 3.00 cm LEFT ATRIUM             Index        RIGHT ATRIUM           Index LA diam:        6.10 cm 2.73 cm/m   RA Area:     22.10 cm LA Vol (A2C):   90.4 ml 40.50 ml/m  RA Volume:   72.10 ml  32.30 ml/m LA Vol (A4C):   84.1 ml 37.68 ml/m LA Biplane Vol: 94.8 ml 42.48 ml/m  AORTIC VALVE              PULMONIC VALVE AV Vmax:      185.00 cm/s PV Vmax:  1.23 m/s AV Peak Grad: 13.7 mmHg   PV Peak grad:  6.1 mmHg LVOT Vmax:    138.00 cm/s LVOT Vmean:   80.000 cm/s LVOT VTI:     0.261 m  AORTA Ao Root diam: 4.30 cm Ao Asc diam:  3.30 cm MITRAL VALVE               TRICUSPID VALVE MV Area (PHT): 5.02 cm    TR Peak grad:   42.0 mmHg MV Decel Time: 151 msec    TR Vmax:        324.00 cm/s MV E velocity: 99.60 cm/s MV A velocity: 76.10 cm/s  SHUNTS MV E/A ratio:  1.31        Systemic VTI: 0.26 m Buford Dresser MD Electronically signed by Buford Dresser MD Signature Date/Time: 12/24/2021/8:17:43 PM    Final     Microbiology: Results for orders placed or performed during the hospital encounter of 12/27/20  SARS CORONAVIRUS 2 (TAT 6-24 HRS) Nasopharyngeal Nasopharyngeal Swab     Status: Abnormal   Collection Time: 12/27/20  2:42 PM   Specimen: Nasopharyngeal Swab  Result Value Ref Range Status   SARS Coronavirus 2 POSITIVE (A) NEGATIVE Final    Comment: (NOTE) SARS-CoV-2 target nucleic acids are DETECTED.  The SARS-CoV-2 RNA is generally detectable in upper and lower respiratory specimens during the acute phase of infection. Positive results are indicative of the presence of SARS-CoV-2 RNA. Clinical correlation with patient history and other diagnostic information is  necessary to determine patient infection status. Positive results do not rule out bacterial infection or co-infection with other viruses.  The expected result is Negative.  Fact Sheet  for Patients: SugarRoll.be  Fact Sheet for Healthcare Providers: https://www.woods-mathews.com/  This test is not yet approved or cleared by the [place] FDA and  has been authorized for detection and/or diagnosis of SARS-CoV-2 by FDA under an Emergency Use Authorization (EUA). This EUA will remain  in effect (meaning this test can be used) for the duration of the COVID-19 declaration under Section 564(b)(1) of the Act, 21 U. S.C. section 360bbb-3(b)(1), unless the authorization is terminated or revoked sooner.   Performed at [facility], [place] [address], [place], [place] [address]     Labs: CBC: Recent Labs  Lab 12/23/21 1249 12/24/21 0204  WBC 7.1 9.0  HGB 15.0 14.8  HCT 46.3 45.2  MCV 96.1 95.0  PLT 216 400   Basic Metabolic Panel: Recent Labs  Lab 12/23/21 1249 12/24/21 0204 12/25/21 0319 12/26/21 0341  NA 139 135 136 137  K 4.1 4.1 3.8 4.1  CL 103 101 101 102  CO2 '27 24 27 27  ' GLUCOSE 108* 150* 117* 98  BUN 20 22* 31* 32*  CREATININE 1.13 1.14 1.41* 1.39*  CALCIUM 9.2 9.4 9.1 9.2  MG  --   --  2.3 2.3   Liver Function Tests: No results for input(s): AST, ALT, ALKPHOS, BILITOT, PROT, ALBUMIN in the last 168 hours. CBG: Recent Labs  Lab 12/25/21 0604 12/25/21 1202 12/25/21 1647 12/25/21 2114 12/26/21 0557  GLUCAP 116* 101* 109* 189* 109*    Discharge time spent: greater than 30 minutes.  Signed: Tawni Millers, MD [facility] 12/26/2021

## 2021-12-26 NOTE — Consult Note (Signed)
   [facility] CM Inpatient Consult   12/26/2021  Kayceon Oki. 1961-03-25 [id number]  [place] Organization [ACO] Patient: [employer]  Primary Care Provider:  Colon Branch, MD, [place] [facility], is an embedded provider with a Chronic Care Management team and program, and is listed for the transition of care follow up and appointments.  Patient was screened for Embedded practice service needs for chronic care management and is currently active an Embedded RNCM and Pharmacist there.  Plan: Will send a notification to the Embedded Care Management team for any updates for [facility] needs for post hospital needs.  Please contact for further questions,  Natividad Brood, RN BSN [facility] Liaison  [phone] business mobile phone Toll free office [phone]  Fax number: [phone] [email or web address]

## 2021-12-26 NOTE — Progress Notes (Signed)
Heart Failure Stewardship Pharmacist Progress Note   PCP: Colon Branch, MD PCP-Cardiologist: [name], MD    HPI:  61 yo male with PMH significant for COPD (2-4L home O2), diastolic CHF, G9FA, h/o OSA, IDA, HLD, HTN, h/o CVA and polysubstance abuse (cocaine, tobacco - 1 PPD, alcohol). He presented to [facility] ED 05/16 with dyspnea, chest pain and bilateral LEE in the setting of diuretic noncompliance [id number] avoid frequent urination - prescribed Lasix 20 mg TID and taking once daily.  CXR with cardiomegaly and pulmonary vascular congestion.  Echo demonstrated LVEF 55-60% with mild LVH, G2DD (improved from G3DD on 01/2021 Echo), normal RV, moderately elevated pulmonary pressures, and biatrial dilation.  Started on IV diuresis.  Mild AKI has limited ability to initiate GDMT.   Current HF Medications: Diuretic: s/p furosemide IV 40 mg x3 Aldosterone Antagonist: spironolactone 12.5 mg daily SGLT2i: Farxiga 10 mg daily Other: Imdur 90 mg daily  Prior to admission HF Medications: Diuretic: furosemide 20-40 mg qAM / 20 mg qHS ACE/ARB/ARNI: valsartan 80 mg qHS Aldosterone Antagonist: spironolactone 12.5 mg daily Other: Imdur 90 mg daily  Pertinent Lab Values: Serum creatinine 1.39, down (baseline ~1.1), BUN 32, Potassium 4.1, Sodium 137, BNP 184.6, Magnesium 2.3, A1c 6.8 (11/2021)  Vital Signs: Weight: 229 lbs (admission weight: 231.3 lbs) Blood pressure: 110-30s/70s  Heart rate: 60-70s  I/O: -1.85 L yesterday; net -3.2 L  Medication Assistance / Insurance Benefits Check: Does the patient have prescription insurance?  Yes Type of insurance plan: [employer] Medicare   Does the patient qualify for medication assistance through manufacturers or grants?   Yes Eligible grants and/or patient assistance programs: yes Medication assistance applications in progress: EF[place] Medication assistance applications approved: pending   Outpatient Pharmacy:  [facility]? Yes Is the patient willing to [facility] their [facility]?   Yes    Assessment: 1. Acute on chronic diastolic CHF (LVEF 521-30%. NYHA class II-III symptoms. AKI limiting addition/titration of GDMT - Continue furosemide 60 mg daily - Continue spironolactone 12.5 mg daily - Continue Jardiance 10 mg daily - Continue Imdur 90 mg daily   Plan: 1) Medication changes recommended at this time: - Start valsartan 40 mg once daily, likely transition [facility] Entresto 24-[facility]  2) Patient assistance: - [place] = EDelene Loll$45 / FWilder Glade$90.77 / Jardiance $45 - Patient assistance forms for JVania Reaand EDelene Lollstarted and signed by patient.  He hasn't started [facility]  He says he hasn't filed taxes in years but he was instructed to bring in any paperwork he has at home for [place]paper work.  3)  Education  - Patient has been educated on current HF medications and potential additions to HF medication regimen - Patient verbalizes understanding that over the next few months, these medication doses may change and more medications may be added to optimize HF regimen - Patient has been educated on basic disease state pathophysiology and goals of therapy  ELaurey Arrow PharmD PGY1 Pharmacy Resident 12/26/2021  9:29 AM

## 2021-12-26 NOTE — TOC Transition Note (Addendum)
Transition of Care [facility]) - CM/SW Discharge Note   Patient Details  Name: [name]. MRN: [id number] Date of Birth: 1960/11/25  Transition of Care [facility]) CM/SW Contact:  [name], RN Phone Number: 12/26/2021, 9:48 AM   Clinical Narrative:    Patient is for dc today, NCM notified Anderson Malta with [facility].  His friend will transport him home.   TOC to fill meds. He is on oxygen, can not go to [employer], still waiting for ride to get here.   Final next level of care: [place] Barriers to Discharge: Continued Medical Work up   Patient Goals and CMS Choice Patient states their goals for this hospitalization and ongoing recovery are:: to return home CMS Medicare.gov Compare Post Acute Care list provided to:: Patient Choice offered to / list presented to : Patient  Discharge Placement                       Discharge Plan and Services   Discharge Planning Services: CM Consult Post Acute Care Choice: Durable Medical Equipment, Home Health                    HH Arranged: RN, Disease Management, PT, OT HH Agency: [facility] Date [place]: 12/24/21 Time [place]: [id number] Representative spoke with at [place]: [place] Determinants of Health (SDOH) Interventions Food Insecurity Interventions: Intervention Not Indicated Financial Strain Interventions: Intervention Not Indicated Housing Interventions: Intervention Not Indicated Transportation Interventions: Intervention Not Indicated   Readmission Risk Interventions     View : No data to display.

## 2021-12-29 ENCOUNTER — Ambulatory Visit: Payer: Medicare HMO | Admitting: Pharmacist

## 2021-12-29 ENCOUNTER — Telehealth: Payer: Self-pay

## 2021-12-29 NOTE — Telephone Encounter (Signed)
Pt seen in office already

## 2021-12-30 ENCOUNTER — Telehealth: Payer: Self-pay | Admitting: Pharmacist

## 2021-12-30 NOTE — Telephone Encounter (Signed)
  Care Management   Follow Up Note   12/30/2021 Name: [name]. MRN: [id number] DOB: 1961-01-07   Referred by: Colon Branch, MD Reason for referral : Appointment   An unsuccessful telephone outreach was attempted today. The patient was referred to the case management team for assistance with care management and care coordination.  Was calling to reviewed medication changes from recent hospitalization.   Follow Up Plan: Telephone follow up appointment with care management team member scheduled for:01/08/2022 already; Clinical Pharmacist Practitioner follow up 01/16/2022  Cherre Robins, PharmD Clinical Pharmacist [place] [facility]

## 2021-12-31 ENCOUNTER — Telehealth: Payer: Self-pay | Admitting: Internal Medicine

## 2021-12-31 NOTE — Telephone Encounter (Signed)
Left message for patient to call back and schedule Medicare Annual Wellness Visit (AWV).   Please offer to do virtually or by telephone.  Left office number and my jabber #[phone].  AWVI eligible as of  08/10/2018  Please schedule at anytime with Nurse Health Advisor.   

## 2021-12-31 NOTE — Chronic Care Management (AMB) (Signed)
Opened in error - patient is not answer for phone visit    Care Management   Follow Up Note   12/31/2021 Name: [name]. MRN: [id number] DOB: 1960/08/25   Referred by: Colon Branch, MD Reason for referral : Chronic Care Management (Follow up)   An unsuccessful telephone outreach was attempted today. The patient was referred to the case management team for assistance with care management and care coordination.   Follow Up Plan: The care management team will reach out to the patient again over the next 14 days.   Cherre Robins, PharmD Clinical Pharmacist [facility]

## 2022-01-02 ENCOUNTER — Telehealth: Payer: Self-pay

## 2022-01-02 DIAGNOSIS — Z7984 Long term (current) use of oral hypoglycemic drugs: Secondary | ICD-10-CM

## 2022-01-02 DIAGNOSIS — K219 Gastro-esophageal reflux disease without esophagitis: Secondary | ICD-10-CM

## 2022-01-02 DIAGNOSIS — J9611 Chronic respiratory failure with hypoxia: Secondary | ICD-10-CM | POA: Diagnosis not present

## 2022-01-02 DIAGNOSIS — Z951 Presence of aortocoronary bypass graft: Secondary | ICD-10-CM

## 2022-01-02 DIAGNOSIS — F32A Depression, unspecified: Secondary | ICD-10-CM

## 2022-01-02 DIAGNOSIS — K449 Diaphragmatic hernia without obstruction or gangrene: Secondary | ICD-10-CM

## 2022-01-02 DIAGNOSIS — Z86010 Personal history of colonic polyps: Secondary | ICD-10-CM

## 2022-01-02 DIAGNOSIS — Z9181 History of falling: Secondary | ICD-10-CM

## 2022-01-02 DIAGNOSIS — F419 Anxiety disorder, unspecified: Secondary | ICD-10-CM | POA: Diagnosis not present

## 2022-01-02 DIAGNOSIS — E785 Hyperlipidemia, unspecified: Secondary | ICD-10-CM | POA: Diagnosis not present

## 2022-01-02 DIAGNOSIS — E1151 Type 2 diabetes mellitus with diabetic peripheral angiopathy without gangrene: Secondary | ICD-10-CM

## 2022-01-02 DIAGNOSIS — Z87891 Personal history of nicotine dependence: Secondary | ICD-10-CM

## 2022-01-02 DIAGNOSIS — J439 Emphysema, unspecified: Secondary | ICD-10-CM | POA: Diagnosis not present

## 2022-01-02 DIAGNOSIS — I252 Old myocardial infarction: Secondary | ICD-10-CM

## 2022-01-02 DIAGNOSIS — I5032 Chronic diastolic (congestive) heart failure: Secondary | ICD-10-CM | POA: Diagnosis not present

## 2022-01-02 DIAGNOSIS — Z7982 Long term (current) use of aspirin: Secondary | ICD-10-CM

## 2022-01-02 DIAGNOSIS — K760 Fatty (change of) liver, not elsewhere classified: Secondary | ICD-10-CM

## 2022-01-02 DIAGNOSIS — Z8673 Personal history of transient ischemic attack (TIA), and cerebral infarction without residual deficits: Secondary | ICD-10-CM

## 2022-01-02 DIAGNOSIS — G4733 Obstructive sleep apnea (adult) (pediatric): Secondary | ICD-10-CM | POA: Diagnosis not present

## 2022-01-02 DIAGNOSIS — Z85038 Personal history of other malignant neoplasm of large intestine: Secondary | ICD-10-CM

## 2022-01-02 DIAGNOSIS — D509 Iron deficiency anemia, unspecified: Secondary | ICD-10-CM | POA: Diagnosis not present

## 2022-01-02 DIAGNOSIS — I110 Hypertensive heart disease with heart failure: Secondary | ICD-10-CM | POA: Diagnosis not present

## 2022-01-02 DIAGNOSIS — I2510 Atherosclerotic heart disease of native coronary artery without angina pectoris: Secondary | ICD-10-CM | POA: Diagnosis not present

## 2022-01-02 DIAGNOSIS — Z7951 Long term (current) use of inhaled steroids: Secondary | ICD-10-CM

## 2022-01-02 NOTE — Telephone Encounter (Signed)
Plan of care signed and faxed back to [facility] at [phone]. Form sent for scanning.

## 2022-01-07 ENCOUNTER — Telehealth (HOSPITAL_COMMUNITY): Payer: Self-pay | Admitting: *Deleted

## 2022-01-07 ENCOUNTER — Encounter (HOSPITAL_COMMUNITY): Payer: Medicare HMO

## 2022-01-07 DIAGNOSIS — F17210 Nicotine dependence, cigarettes, uncomplicated: Secondary | ICD-10-CM | POA: Diagnosis not present

## 2022-01-07 DIAGNOSIS — I110 Hypertensive heart disease with heart failure: Secondary | ICD-10-CM

## 2022-01-07 DIAGNOSIS — J449 Chronic obstructive pulmonary disease, unspecified: Secondary | ICD-10-CM

## 2022-01-07 DIAGNOSIS — N401 Enlarged prostate with lower urinary tract symptoms: Secondary | ICD-10-CM | POA: Diagnosis not present

## 2022-01-07 DIAGNOSIS — E1159 Type 2 diabetes mellitus with other circulatory complications: Secondary | ICD-10-CM | POA: Diagnosis not present

## 2022-01-07 DIAGNOSIS — I509 Heart failure, unspecified: Secondary | ICD-10-CM | POA: Diagnosis not present

## 2022-01-07 DIAGNOSIS — R32 Unspecified urinary incontinence: Secondary | ICD-10-CM | POA: Diagnosis not present

## 2022-01-07 DIAGNOSIS — E785 Hyperlipidemia, unspecified: Secondary | ICD-10-CM | POA: Diagnosis not present

## 2022-01-07 NOTE — Telephone Encounter (Signed)
Heart Failure Nurse Navigator Progress Note   Attempted to leave reminder message for appointment on 01/07/22 @ 3 pm, wrong number for voicemail.   Earnestine Leys, BSN, Clinical cytogeneticist Only

## 2022-01-09 ENCOUNTER — Telehealth: Payer: Self-pay | Admitting: *Deleted

## 2022-01-09 ENCOUNTER — Telehealth: Payer: Medicare HMO | Admitting: *Deleted

## 2022-01-09 NOTE — Telephone Encounter (Signed)
  Care Management   Follow Up Note   01/09/2022  Name: [name]. MRN: [id number] DOB: Feb 07, 1961  Referred By: Colon Branch, MD  Reason for Referral: Chronic Care Management Needs in Patient with Abnormality of Gait, Alcohol Use and Abuse, Chronic Pain Syndrome, Morbid Obesity, Type II Diabetes Mellitus, and Lack of Advanced Directives.  An unsuccessful telephone outreach was attempted today. The patient was referred to the case management team for assistance with care management and care coordination. A HIPAA compliant message was left on voicemail for patient, providing contact information, encouraging him to return LCSW's call at his earliest convenience.  LCSW will make a second initial telephone outreach call attempt within the next 7-10 business days, if a return call is not received from patient in the meantime.  Follow-Up Plan:  Request placed with Scheduling Care Guide to reschedule patient's initial telephone outreach call attempt with LCSW.  [place] Licensed Clinical Social Worker [facility] [employer] [facility] [phone]

## 2022-01-12 ENCOUNTER — Telehealth: Payer: Self-pay | Admitting: *Deleted

## 2022-01-12 NOTE — Chronic Care Management (AMB) (Signed)
  Chronic Care Management Note  01/12/2022 Name: [name]. MRN: [id number] DOB: 04/28/1961  [name]. is a 61 y.o. year old male who is a primary care patient of Colon Branch, MD and is actively engaged with the care management team. I reached out to [name]. by phone today to assist with re-scheduling an initial visit with the Licensed Clinical Social Worker  Follow up plan: Unsuccessful telephone outreach attempt made. A HIPAA compliant phone message was left for the patient providing contact information and requesting a return call.   Julian Hy, [place] Management  Direct Dial: [phone]

## 2022-01-13 ENCOUNTER — Encounter: Payer: Self-pay | Admitting: Cardiovascular Disease

## 2022-01-13 ENCOUNTER — Ambulatory Visit (INDEPENDENT_AMBULATORY_CARE_PROVIDER_SITE_OTHER): Payer: Medicare HMO | Admitting: Cardiovascular Disease

## 2022-01-13 VITALS — BP 132/80 | HR 73 | Ht 71.0 in | Wt 234.0 lb

## 2022-01-13 DIAGNOSIS — Z720 Tobacco use: Secondary | ICD-10-CM

## 2022-01-13 DIAGNOSIS — E782 Mixed hyperlipidemia: Secondary | ICD-10-CM | POA: Diagnosis not present

## 2022-01-13 DIAGNOSIS — I2510 Atherosclerotic heart disease of native coronary artery without angina pectoris: Secondary | ICD-10-CM

## 2022-01-13 DIAGNOSIS — I5032 Chronic diastolic (congestive) heart failure: Secondary | ICD-10-CM

## 2022-01-13 DIAGNOSIS — I739 Peripheral vascular disease, unspecified: Secondary | ICD-10-CM

## 2022-01-13 NOTE — Progress Notes (Unsigned)
Cardiology Office Note   Date:  01/14/2022   ID:  [name]., DOB May 27, 1961, MRN [id number]  PCP:  Colon Branch, MD  Cardiologist:  Dr. Angelena Form  No chief complaint on file.     History of Present Illness: [name]. is a 61 y.o. male who is here today for reevaluation of peripheral arterial disease and claudication.  He has not been seen by me since 2021.  He has known history of coronary artery disease status post CABG in 2010 and PCI on SVG to OM later that year, hypertension, hyperlipidemia, chronic diastolic heart failure, polysubstance abuse and multiple other comorbidities. He was hospitalized in November, 2017 for ST elevation myocardial infarction complicated by ventricular fibrillation. Cardiac cath showed occluded SVG to OM which was treated successfully with PCI and drug-eluting stent placement.  He was seen in 2018 for bilateral leg claudication.  Noninvasive vascular evaluation showed an ABI of 0.82 on the right and 0.66 on the left.  Angiography was done in February 2018 which showed no significant aortoiliac disease other than occluded internal iliac arteries, occluded left SFA with three-vessel runoff below the knee and significant disease affecting the right proximal SFA. I performed successful atherectomy of the left SFA followed by drug-coated balloon angioplasty and stenting of the midsegment.  Unfortunately, he continues to smoke and has significant COPD.  He is supposed to be on home oxygen but does not use it all the time.[name]  He was hospitalized last month with acute on chronic diastolic heart failure improved with diuresis.  He returns now with severe right leg claudication with no symptoms on the left side.  This happens with short distance walking.  No lower extremity ulceration.  Past Medical History:  Diagnosis Date   (HFpEF) heart failure with preserved ejection fraction ([place])    Echo 6/22: EF 55-60, no RWMA, GR 3 DD, elevated LVEDP,  normal RVSF, RVSP 53.2, mild to moderate AV sclerosis without stenosis, dilated aortic root (41 mm), ascending aorta 37 mm   Abnormality of gait    Anxiety    CAD (coronary artery disease)    a. 10/2008 Inferolateral STEMI: 3VD w/ occluded LCX (PTCA)-->CABG x 3 (LIMA->LAD, VG->OM, VG->PDA);  b. 06/2009 NSTEMI/PCI: VG->OM 100 (BMS);  c. 05/2013 native 3VD, 3/3 patent grafts. d. 06/2016: Inferior STEMI w/ occluded SVG-OM (DES placed). LIMA-LAD and SVG-PDA patent.   Chronic bronchitis (HCC)    Chronic diastolic CHF (congestive heart failure) ([place])    a. 02/2015 Echo: EF 50-55%, distal septal, apical, inferobasal HK, mild LVH, mild MR, mildly dil LA.   Cocaine use    Colon polyps    a. 09/2015 colonscopy: multiple sessile polyps, path negative for high grade dysplasia.   Depression    Emphysema (subcutaneous) (surgical) resulting from a procedure 10/2008.   Bleb resected at time of 10/2008 CABG. Marketed emphysema noted on surgical report.   Esophagitis    a. 2017 EGD: esophagitis, duodenitis.   ETOH abuse    GERD (gastroesophageal reflux disease)    Hemianopia, homonymous, right    "can't see out the right side of either eye since stroke in 2016/pt description 09/23/2016   Hepatic steatosis    Hiatal hernia    History of nuclear stress test 08/2017   Nuclear stress test 1/19: EF 35, inf-lat and ant-lat, apical sept/apical scar; no ischemia; Intermediate Risk   Hyperlipidemia    Hypertension    Iron deficiency anemia 2010   Morbid obesity ([place])  Myocardial infarction [facility]) "several"   PAD (peripheral artery disease) ([place])    Pancreatitis 06/2015   Sleep apnea    "don't have a mask" (09/23/2016)   Stroke ([place]) 02/2015   a. 02/2015 Carotid U/S: 1-39% bilat ICA stenosis; "left me blind" (09/23/2016)   Tobacco abuse    still smokes   Tubular adenoma of colon     Past Surgical History:  Procedure Laterality Date   ABDOMINAL AORTOGRAM W/LOWER EXTREMITY N/A 09/23/2016   Procedure: Abdominal  Aortogram w/Lower Extremity;  Surgeon: Wellington Hampshire, MD;  Location: [place] [facility];  Service: Cardiovascular;  Laterality: N/A;   CARDIAC CATHETERIZATION  11/07/2008   EF of 50-55% -- normal LV systolic function, acute inferolateral ST elevation MI,  PTCA of the circumflex artery -- [name]. [name], [name]   CARDIAC CATHETERIZATION N/A 07/07/2016   Procedure: Left Heart Cath and Cors/Grafts Angiography;  Surgeon: [name], MD;  Location: [place] [facility];  Service: Cardiovascular;  Laterality: N/A;   CARDIAC CATHETERIZATION N/A 07/07/2016   Procedure: Coronary Stent Intervention;  Surgeon: [name], MD;  Location: [place] [facility];  Service: Cardiovascular;  Laterality: N/A;   CORONARY ANGIOPLASTY     CORONARY ARTERY BYPASS GRAFT     "CABG X4"   LEFT HEART CATH AND CORS/GRAFTS ANGIOGRAPHY N/A 10/01/2017   Procedure: LEFT HEART CATH AND CORS/GRAFTS ANGIOGRAPHY;  Surgeon: Burnell Blanks, MD;  Location: [place] [facility];  Service: Cardiovascular;  Laterality: N/A;   LEFT HEART CATHETERIZATION WITH CORONARY/GRAFT ANGIOGRAM N/A 05/15/2013   Procedure: LEFT HEART CATHETERIZATION WITH Beatrix Fetters;  Surgeon: [name], MD;  Location: [facility] LAB;  Service: Cardiovascular;  Laterality: N/A;   PERIPHERAL VASCULAR INTERVENTION  09/23/2016   Procedure: Peripheral Vascular Intervention;  Surgeon: Wellington Hampshire, MD;  Location: [place] [facility];  Service: Cardiovascular;;  Lt. SFA     Current Outpatient Medications  Medication Sig Dispense Refill   aspirin EC 81 MG EC tablet Take 1 tablet (81 mg total) by mouth daily with breakfast. 30 tablet 2   atorvastatin (LIPITOR) 80 MG tablet TAKE 1 TABLET EVERY DAY AT 6:00PM (Patient taking differently: Take 80 mg by mouth at bedtime.) 90 tablet 2   augmented betamethasone dipropionate (DIPROLENE-AF) 0.05 % cream APPLY TOPICALLY TWICE DAILY (Patient taking differently: Apply 1 application. topically 2 (two) times  daily.) 50 g 1   Blood Glucose Monitoring Suppl (ONETOUCH VERIO REFLECT) w/Device KIT Use to check blood glucose daily (DX: type 2 DM - E 11.51) 1 kit 0   budesonide-formoterol (SYMBICORT) 160-4.5 MCG/ACT inhaler Inhale 2 puffs into the lungs 2 (two) times daily. (Patient taking differently: Inhale 1-2 puffs into the lungs See admin instructions. Inhale 1 puff into the lungs in the morning and 2 puffs in the evening) 30 g 1   Cholecalciferol (VITAMIN D-3) 125 MCG (5000 UT) TABS Take 5,000 Units by mouth daily.     clopidogrel (PLAVIX) 75 MG tablet TAKE 1 TABLET EVERY DAY (Patient taking differently: Take 75 mg by mouth in the morning.) 90 tablet 3   Cyanocobalamin (VITAMIN B-12) 5000 MCG SUBL Place 5,000 mcg under the tongue daily.     diclofenac Sodium (VOLTAREN) 1 % GEL Apply 1 application. topically daily as needed for pain.     empagliflozin (JARDIANCE) 10 MG TABS tablet Take 1 tablet (10 mg total) by mouth daily. 30 tablet 0   ezetimibe (ZETIA) 10 MG tablet Take 1 tablet (10 mg total)  by mouth daily. (Patient taking differently: Take 10 mg by mouth at bedtime.) 90 tablet 3   feeding supplement (ENSURE ENLIVE / ENSURE PLUS) LIQD Take 237 mLs by mouth 2 (two) times daily between meals. 14220 mL 0   fenofibrate (TRICOR) 145 MG tablet TAKE 1 TABLET EVERY DAY (Patient taking differently: Take 145 mg by mouth in the morning.) 90 tablet 1   folic acid (FOLVITE) 1 MG tablet Take 1 tablet (1 mg total) by mouth daily. 90 tablet 3   furosemide (LASIX) 20 MG tablet Take 3 tablets (60 mg total) by mouth daily. 30 tablet 0   gabapentin (NEURONTIN) 100 MG capsule Take 2 capsules (200 mg total) by mouth 3 (three) times daily. (Patient taking differently: Take 100-200 mg by mouth See admin instructions. Take 200 mg by mouth in the morning and 100 mg at bedtime) 180 capsule 0   glucose blood (ONETOUCH VERIO) test strip Use to check blood glucose once a day (DX: 11.51 - type 2 DM) 100 each 4   isosorbide  mononitrate (IMDUR) 60 MG 24 hr tablet Take 1.5 tablets (90 mg total) by mouth daily. 135 tablet 3   Multiple Vitamins-Minerals (CERTAVITE/ANTIOXIDANTS) TABS Take 1 tablet by mouth daily. 30 tablet 0   nitroGLYCERIN (NITROSTAT) 0.4 MG SL tablet Place 1 tablet (0.4 mg total) under the tongue every 5 (five) minutes x 3 doses as needed for chest pain. 25 tablet 2   OneTouch Delica Lancets 46K MISC Use to check blood glucose once a day (DX: E11.51 type 2 DM) 100 each 4   OXYGEN Inhale 2-4 L/min into the lungs as needed (for shortness of breath).     pantoprazole (PROTONIX) 40 MG tablet TAKE 1 TABLET TWICE DAILY (Patient taking differently: Take 40 mg by mouth 2 (two) times daily before a meal.) 180 tablet 2   PRESCRIPTION MEDICATION CPAP- At bedtime     spironolactone (ALDACTONE) 25 MG tablet Take 0.5 tablets (12.5 mg total) by mouth daily. 45 tablet 3   tamsulosin (FLOMAX) 0.4 MG CAPS capsule Take 1 capsule (0.4 mg total) by mouth in the morning and at bedtime. 180 capsule 1   thiamine 100 MG tablet Take 1 tablet (100 mg total) by mouth daily. 90 tablet 3   traZODone (DESYREL) 100 MG tablet Take 1 tablet (100 mg total) by mouth at bedtime. (Patient taking differently: Take 200 mg by mouth at bedtime.) 180 tablet 1   valsartan (DIOVAN) 40 MG tablet Take 1 tablet (40 mg total) by mouth daily. 30 tablet 0   No current facility-administered medications for this visit.    Allergies:   Bupropion and Metformin and related    Social History:  The patient  reports that he has been smoking cigarettes. He started smoking about 47 years ago. He has a 22.50 pack-year smoking history. He has never used smokeless tobacco. He reports current alcohol use of about 3.0 standard drinks per week. He reports current drug use. Drug: Marijuana.   Family History:  The patient's family history includes Colon cancer in his father; Diabetes in his mother; Heart disease in his mother; Heart failure in his mother.    ROS:   Please see the history of present illness.   Otherwise, review of systems are positive for none.   All other systems are reviewed and negative.    PHYSICAL EXAM: VS:  BP 132/80   Pulse 73   Ht '5\' 11"'  (1.803 m)   Wt 234 lb (106.1 kg)  SpO2 91%   BMI 32.64 kg/m  , BMI Body mass index is 32.64 kg/m. GEN: Well nourished, well developed, in no acute distress  HEENT: normal  Neck: no JVD, carotid bruits, or masses Cardiac: RRR; no murmurs, rubs, or gallops,no edema  Respiratory:  clear to auscultation bilaterally, normal work of breathing GI: soft, nontender, nondistended, + BS MS: no deformity or atrophy  Skin: warm and dry, no rash Neuro:  Strength and sensation are intact Psych: euthymic mood, full affect Vascular: Dorsalis pedis and posterior tibialis are normal on the left side but not palpable on the right side.   EKG:  EKG is not ordered today.    Recent Labs: 04/11/2021: ALT 15 05/07/2021: TSH 1.08 12/23/2021: B Natriuretic Peptide 184.6 12/24/2021: Hemoglobin 14.8; Platelets 195 12/26/2021: BUN 32; Creatinine, Ser 1.39; Magnesium 2.3; Potassium 4.1; Sodium 137    Lipid Panel    Component Value Date/Time   CHOL 141 05/07/2021 1347   CHOL 171 11/14/2019 1441   TRIG 142.0 05/07/2021 1347   HDL 41.70 05/07/2021 1347   HDL 37 (L) 11/14/2019 1441   CHOLHDL 3 05/07/2021 1347   VLDL 28.4 05/07/2021 1347   LDLCALC 71 05/07/2021 1347   LDLCALC 116 (H) 11/14/2019 1441   LDLDIRECT 116.0 05/12/2013 1517      Wt Readings from Last 3 Encounters:  01/13/22 234 lb (106.1 kg)  12/26/21 229 lb 6.4 oz (104.1 kg)  11/05/21 236 lb 2 oz (107.1 kg)           View : No data to display.            ASSESSMENT AND PLAN:  1.  Peripheral arterial disease: The patient had previous left SFA endovascular intervention.  He is to be asymptomatic on the left side with palpable distal pulses.  However, he now complains of severe right leg claudication with no palpable pulses  distally.  I requested an ABI and duplex for evaluation.  He might require angiography and endovascular intervention but I discussed with him the importance of smoking cessation first.    2. Coronary artery disease without angina: Cardiac cath in 2019 showed patent grafts.  No chest pain but has significant shortness of breath which is likely related to COPD and chronic diastolic heart failure.  3. Tobacco use: I again discussed with him the importance of smoking cessation.  4.  Mixed hyperlipidemia: He is currently on high-dose atorvastatin, Zetia and fenofibrate.  5.  Chronic diastolic heart failure: He appears to be euvolemic on furosemide 60 mg daily.  He is on good medical therapy.   Disposition:   FU with me in 6 months but earlier if lower extremity Doppler is significantly abnormal.  Signed,  Kathlyn Sacramento, MD  01/14/2022 4:32 PM    [facility]

## 2022-01-13 NOTE — Patient Instructions (Signed)
Medication Instructions:  Your physician recommends that you continue on your current medications as directed. Please refer to the Current Medication list given to you today.  *If you need a refill on your cardiac medications before your next appointment, please call your pharmacy*  Testing/Procedures: Your physician has requested that you have an ankle brachial index (ABI). During this test an ultrasound and blood pressure cuff are used to evaluate the arteries that supply the arms and legs with blood. Allow thirty minutes for this exam. There are no restrictions or special instructions.  Your physician has requested that you have a lower extremity arterial duplex. This test is an ultrasound of the arteries in the legs. It looks at arterial blood flow in the legs. Allow one hour for Lower Arterial scans. There are no restrictions or special instructions  Follow-Up: At [facility], you and your health needs are our priority.  As part of our continuing mission to provide you with exceptional heart care, we have created designated Provider Care Teams.  These Care Teams include your primary Cardiologist (physician) and Advanced Practice Providers (APPs -  Physician Assistants and Nurse Practitioners) who all work together to provide you with the care you need, when you need it.  We recommend signing up for the patient portal called "MyChart".  Sign up information is provided on this After Visit Summary.  MyChart is used to connect with patients for Virtual Visits (Telemedicine).  Patients are able to view lab/test results, encounter notes, upcoming appointments, etc.  Non-urgent messages can be sent to your provider as well.   To learn more about what you can do with MyChart, go to [email or web address].    Your next appointment:   6 month(s)  The format for your next appointment:   In Person  Provider:   Dr. Arida   Important Information About Sugar       

## 2022-01-15 ENCOUNTER — Ambulatory Visit (INDEPENDENT_AMBULATORY_CARE_PROVIDER_SITE_OTHER): Payer: Medicare HMO | Admitting: Pharmacist

## 2022-01-15 ENCOUNTER — Telehealth: Payer: Self-pay | Admitting: *Deleted

## 2022-01-15 DIAGNOSIS — E785 Hyperlipidemia, unspecified: Secondary | ICD-10-CM

## 2022-01-15 DIAGNOSIS — J449 Chronic obstructive pulmonary disease, unspecified: Secondary | ICD-10-CM

## 2022-01-15 DIAGNOSIS — I10 Essential (primary) hypertension: Secondary | ICD-10-CM

## 2022-01-15 DIAGNOSIS — I739 Peripheral vascular disease, unspecified: Secondary | ICD-10-CM

## 2022-01-15 DIAGNOSIS — E1151 Type 2 diabetes mellitus with diabetic peripheral angiopathy without gangrene: Secondary | ICD-10-CM

## 2022-01-15 DIAGNOSIS — I5032 Chronic diastolic (congestive) heart failure: Secondary | ICD-10-CM

## 2022-01-15 NOTE — Chronic Care Management (AMB) (Signed)
//   Chronic Care Management Pharmacy Note  01/15/2022 Name:  [name]. MRN:  [id number] DOB:  [birth year]  Summary:   Leg Pain: Patient saw [name] 01/13/2022 to follow up right SFA disease. Has ABI and ultrasound scheduled for 01/27/2022 CHF: patient is tolerating addition of Jardiance 72m daily that was added after hospitalization in mid May 2023 for CHF exacerbation. Patient was to f/u with cardio 01/07/2022 but he missed appointment. Recommended he f/u with cardio. Called to reschedule appointment.  Diabetes: Last A1c had increased to 6.8%. Patient was unable to tolerate metformin - even 2571mdaily caused GI symptoms. Jardiance started in hospital in May 2023 due CHF and should also lower blood glucose. Patient has glucometer but has not checked blood glucose in the last 2 weeks.  Medication Management: reviewed meds list and assessed need for RFs.   Patient to restart weighing daily. Report weight gain of more than 3 lbs in 24 hours or 5 lbs in 1 week.Discussed signs and symptoms of CHF exacerbation - weight gain, SOB, abdominal fullness, swelling in legs or abdomen, Fatigue and weakness, changes in ability to perform usual activities, persistent cough or wheezing with white or pink blood-tinged mucus, nausea and lack of appetite.  Continue Jardiance 1081maily. Recommend follow up with cardiologist [facility]   Start checking blood glucose daily to every other day. Reviewed home blood glucose goals  Fasting blood glucose goal (before meals) = 80 to 130 Blood glucose goal after a meal = less than 180     Subjective: [name] an [age]o. year old male who is a primary patient of [name].  The CCM team was consulted for assistance with disease management and care coordination needs.    Collaboration with patient  for follow up visit in response to provider referral for pharmacy case management  and/or care coordination services.   Consent to Services:  The patient was given information about Chronic Care Management services, agreed to services, and gave verbal consent prior to initiation of services.  Please see initial visit note for detailed documentation.   Patient Care Team: PazColon [name] as PCP - General (Internal Medicine) [name] as PCP - Cardiology (Cardiology) [name] as Consulting Physician (Ophthalmology) [name] as Consulting Physician (Gastroenterology) [name] as Consulting Physician (Orthopedic Surgery) [name] as Case Manager [name] (Pharmacist) [name] (Inactive) as Consulting Physician (Otolaryngology) [name] as Social Worker (Licensed Clinical Social Worker)  Recent PCP office visits:  03/39/2023 - PCP (Kenneth PazLarose Kellsollow up. Labs checked. No med changes.  Recent specialist consults:  01/13/2022 - Cardio (Kenneth AriFletcher [name]/U PAD and claudication. Ordered ABI and lower ext arterial duplex. F/U 6 months. no med changes noted.  Recent [facility]:  [facility]  Medications started at discharge: Jardiance 49m39mily  Medications stopped at discharge: none Medications changed at discharge: decreased valsartan dose to 40mg36mly and changed furosemide to 20mg 66mke 3 tablets = 60mg o40ma day.  Objective:  Lab Results  Component Value Date   CREATININE 1.39 (H) 12/26/2021   CREATININE 1.41 (H) 12/25/2021   CREATININE 1.14 12/24/2021    Lab Results  Component Value Date   HGBA1C 6.8 (H) 11/05/2021   Last diabetic Eye exam:  Lab Results  Component Value Date/Time   HMDIABEYEEXA No Retinopathy 06/30/2021 12:00 AM    Last diabetic Foot exam: No results found for: "HMDIABFOOTEX"      Component Value Date/Time   CHOL 141 05/07/2021 1347   CHOL 171 11/14/2019 1441   TRIG 142.0  05/07/2021 1347   HDL 41.70 05/07/2021 1347   HDL 37 (L) 11/14/2019 1441   CHOLHDL 3 05/07/2021 1347   VLDL 28.4 05/07/2021 1347   LDLCALC 71 05/07/2021 1347   LDLCALC 116 (H) 11/14/2019 1441   LDLDIRECT 116.0 05/12/2013 1517       Latest Ref Rng & Units 04/11/2021   10:56 AM 07/10/2020    2:45 PM 11/26/2018   12:22 PM  Hepatic Function  Total Protein 6.5 - 8.1 g/dL 7.9  7.1  7.8   Albumin 3.5 - 5.0 g/dL 4.3  4.0  3.9   AST 15 - 41 U/L '18  12  22   ' ALT 0 - 44 U/L '15  13  19   ' Alk Phosphatase 38 - 126 U/L 66  135  88   Total Bilirubin 0.3 - 1.2 mg/dL 1.5  1.2  2.1     Lab Results  Component Value Date/Time   TSH 1.895 07/07/2016 06:31 PM   TSH 1.88 05/05/2013 12:14 PM   TSH 2.290 **Test methodology is 3rd generation TSH** 06/23/2009 07:55 AM       Latest Ref Rng & Units 12/24/2021    2:04 AM 12/23/2021   12:49 PM 09/05/2021    2:57 PM  CBC  WBC 4.0 - 10.5 K/uL 9.0  7.1  9.1   Hemoglobin 13.0 - 17.0 g/dL 14.8  15.0  16.2   Hematocrit 39.0 - 52.0 % 45.2  46.3  47.8   Platelets 150 - 400 K/uL 195  216  209     Lab Results  Component Value Date/Time   VD25OH 39.28 11/05/2021 04:04 PM   VD25OH 10.8 (L) 11/03/2016 02:53 PM    Clinical ASCVD: Yes  The ASCVD Risk score (Arnett DK, et al., 2019) failed to calculate for the following reasons:   The patient has a prior MI or stroke diagnosis     Social History   Tobacco Use  Smoking Status Every Day   Packs/day: 0.50   Years: 45.00   Total pack years: 22.50   Types: Cigarettes   Start date: 1976  Smokeless Tobacco Never  Tobacco Comments   1 pack daily- wants to quit   BP Readings from Last 3 Encounters:  01/13/22 132/80  12/26/21 128/74  11/05/21 136/82   Pulse Readings from Last 3 Encounters:  01/13/22 73  12/26/21 68  11/05/21 91   Wt Readings from Last 3 Encounters:  01/13/22 234 lb (106.1 kg)  12/26/21 229 lb 6.4 oz (104.1 kg)  11/05/21 236 lb 2 oz (107.1 kg)    Assessment: Review of patient past  medical history, allergies, medications, health status, including review of consultants reports, laboratory and other test data, was performed as part of comprehensive evaluation and provision of chronic care management services.   SDOH:  (Social Determinants of Health) assessments and interventions performed:  SDOH Interventions    Flowsheet Row Most Recent Value  SDOH Interventions   Physical Activity Interventions Patient Refused, Other (Comments)  [limited by leg pain /COPD]          CCM Care Plan  Allergies  Allergen Reactions   Bupropion Nausea Only   Metformin And Related Diarrhea and Other (See Comments)  Severe diarrhea    Medications Reviewed Today     Reviewed by Cherre Robins, RPH-CPP (Pharmacist) on 01/15/22 at 1425  Med List Status: <None>   Medication Order Taking? Sig Documenting Provider Last Dose Status Informant  aspirin EC 81 MG EC tablet [id number] Yes Take 1 tablet (81 mg total) by mouth daily with breakfast. Roxan Hockey, MD Taking Active Multiple Informants  atorvastatin (LIPITOR) 80 MG tablet [id number] Yes TAKE 1 TABLET EVERY DAY AT 6:00PM  Patient taking differently: Take 80 mg by mouth at bedtime.   Burnell Blanks, MD Taking Active Multiple Informants  augmented betamethasone dipropionate (DIPROLENE-AF) 0.05 % cream [id number]  APPLY TOPICALLY TWICE DAILY  Patient taking differently: 1 Application 2 (two) times daily.   Kenneth Branch, MD  Active Multiple Informants  Blood Glucose Monitoring Suppl [facility] VERIO REFLECT) w/Device Drucie Opitz [id number] Yes Use to check blood glucose daily (DX: type 2 DM - E 11.51) Kenneth Branch, MD Taking Active Multiple Informants  budesonide-formoterol [facility]) 160-4.5 MCG/ACT inhaler [id number] Yes Inhale 2 puffs into the lungs 2 (two) times daily.  Patient taking differently: Inhale 1-2 puffs into the lungs See admin instructions. Inhale 1 puff into the lungs in the morning and 2 puffs in the evening   [name]  E, MD Taking Active Multiple Informants  Cholecalciferol (VITAMIN D-3) 125 MCG (5000 UT) TABS [id number] Yes Take 5,000 Units by mouth daily. [provider] Taking Active Multiple Informants  clopidogrel (PLAVIX) 75 MG tablet [id number] Yes TAKE 1 TABLET EVERY DAY Burnell Blanks, MD Taking Active Multiple Informants  Cyanocobalamin (VITAMIN B-12) 5000 MCG SUBL [id number] Yes Place 5,000 mcg under the tongue daily. [provider] Taking Active Multiple Informants  diclofenac Sodium (VOLTAREN) 1 % GEL [id number]  Apply 1 application. topically daily as needed for pain. [provider]  Active Multiple Informants  empagliflozin (JARDIANCE) 10 MG TABS tablet [id number] Yes Take 1 tablet (10 mg total) by mouth daily. Arrien, Jimmy Picket, MD Taking Active   ezetimibe (ZETIA) 10 MG tablet [id number] Yes Take 1 tablet (10 mg total) by mouth daily.  Patient taking differently: Take 10 mg by mouth at bedtime.   Burnell Blanks, MD Taking Active Multiple Informants  feeding supplement (ENSURE ENLIVE / ENSURE PLUS) LIQD [id number] No Take 237 mLs by mouth 2 (two) times daily between meals.  Patient not taking: Reported on 01/15/2022   Arrien, Jimmy Picket, MD Not Taking Active   fenofibrate (TRICOR) 145 MG tablet [id number] Yes TAKE 1 TABLET EVERY DAY  Patient taking differently: Take 145 mg by mouth in the morning.   Kenneth Branch, MD Taking Active Multiple Informants  folic acid (FOLVITE) 1 MG tablet [id number] Yes Take 1 tablet (1 mg total) by mouth daily. Roxan Hockey, MD Taking Active Multiple Informants  furosemide (LASIX) 20 MG tablet [id number] Yes Take 3 tablets (60 mg total) by mouth daily. Arrien, Jimmy Picket, MD Taking Active   gabapentin (NEURONTIN) 100 MG capsule [id number] Yes Take 2 capsules (200 mg total) by mouth 3 (three) times daily.  Patient taking differently: Take 100-200 mg by mouth See admin instructions. Take 200 mg by mouth in the morning  and 100 mg at bedtime   Kenneth Branch, MD Taking Active Multiple Informants           Med Note Duffy Bruce, Legrand Como   Tue Dec 23, 2021  8:41 PM)    glucose blood [facility] VERIO) test strip [id number] Yes Use to check blood  glucose once a day (DX: 11.51 - type 2 DM) Kenneth Branch, MD Taking Active Multiple Informants  isosorbide mononitrate (IMDUR) 60 MG 24 hr tablet [id number] Yes Take 1.5 tablets (90 mg total) by mouth daily. Burnell Blanks, MD Taking Active Multiple Informants  Multiple Vitamins-Minerals (CERTAVITE/ANTIOXIDANTS) TABS [id number] Yes Take 1 tablet by mouth daily. Arrien, Jimmy Picket, MD Taking Active   nitroGLYCERIN (NITROSTAT) 0.4 MG SL tablet [id number] Yes Place 1 tablet (0.4 mg total) under the tongue every 5 (five) minutes x 3 doses as needed for chest pain. Kenneth Branch, MD Taking Active Multiple Informants           Med Note Evangeline Gula Dec 23, 2021  2:21 PM)    Jonetta Speak Lancets 51V MISC [id number] Yes Use to check blood glucose once a day (DX: E11.51 type 2 DM) Kenneth Branch, MD Taking Active Multiple Informants  OXYGEN [id number] Yes Inhale 2-4 L/min into the lungs as needed (for shortness of breath). [provider] Taking Active Multiple Informants  pantoprazole (PROTONIX) 40 MG tablet [id number] Yes TAKE 1 TABLET TWICE DAILY  Patient taking differently: Take 40 mg by mouth 2 (two) times daily before a meal.   Burnell Blanks, MD Taking Active Multiple Informants  PRESCRIPTION MEDICATION [id number] Yes CPAP- At bedtime [provider] Taking Active Multiple Informants  spironolactone (ALDACTONE) 25 MG tablet [id number] Yes Take 0.5 tablets (12.5 mg total) by mouth daily. Burnell Blanks, MD Taking Active Multiple Informants  tamsulosin [facility]) 0.4 MG CAPS capsule [id number] Yes Take 1 capsule (0.4 mg total) by mouth in the morning and at bedtime. Kenneth Branch, MD Taking Active Multiple Informants  thiamine 100 MG tablet  [id number] Yes Take 1 tablet (100 mg total) by mouth daily. Roxan Hockey, MD Taking Active Multiple Informants  traZODone (DESYREL) 100 MG tablet [id number] Yes Take 1 tablet (100 mg total) by mouth at bedtime. Kenneth Branch, MD Taking Active Multiple Informants  valsartan (DIOVAN) 40 MG tablet [id number] Yes Take 1 tablet (40 mg total) by mouth daily. Arrien, Jimmy Picket, MD Taking Active             Patient Active Problem List   Diagnosis Date Noted   Class 1 obesity 12/25/2021   AKI (acute kidney injury) ([place]) 12/25/2021   Acute on chronic diastolic heart failure ([place]) 12/23/2021   Chronic respiratory failure with hypoxia ([place]) 12/23/2021   COPD exacerbation ([place]) 12/23/2021   Alcohol use 12/23/2021   (HFpEF) heart failure with preserved ejection fraction (HCC)    DOE (dyspnea on exertion) 11/07/2020   Type 2 diabetes mellitus with hyperlipidemia ([place]) 03/18/2020   Prolapsed lumbar disc 01/25/2020   Hiatal hernia    Hepatic steatosis    Hemianopia, homonymous, right    ETOH abuse    Esophagitis    Chronic diastolic CHF (congestive heart failure) (HCC)    Chronic bronchitis ([place])    CAD (coronary artery disease)    PAD (peripheral artery disease) ([place]) 09/23/2016   Morbid obesity ([place])    Chronic pain syndrome 10/23/2015   GERD (gastroesophageal reflux disease) 08/07/2015   Acute ischemic stroke ([place]) 02/20/2015   BPH (benign prostatic hyperplasia) 02/20/2015   OSA (obstructive sleep apnea) ?? 05/05/2013   Erectile dysfunction 03/17/2012   Emphysema (subcutaneous) (surgical) resulting from a procedure 10/08/2008   Iron deficiency anemia 08/10/2008   ABNORMALITY OF GAIT 08/06/2008   Cigarette smoker 07/27/2008   Essential hypertension 07/26/2008  ST elevation myocardial infarction involving left circumflex coronary artery ([place]) 07/26/2008    Immunization History  Administered Date(s) Administered   PFIZER(Purple Top)SARS-COV-2 Vaccination 11/17/2019, 12/11/2019,  07/18/2020   Pneumococcal Polysaccharide-23 11/03/2016   Tdap 11/03/2016    Conditions to be addressed/monitored: CHF, CAD, HTN, HLD, Hypertriglyceridemia, COPD, DMII, and BPH; OSA; GERD; fatty liver; h/o strokePAD; sleep disturbance; anxiety/ depression  Care Plan : Pharmacy Care Plan  Updates made by Cherre Robins, RPH-CPP since 01/15/2022 12:00 AM     Problem: HTN; CHF: CAD; GERD with esophagitis; depression; BPH; COPD; bronchitis; stroke; elevated TG and hyperlipidemia; type 2 DM with h/o pancreatitis   Priority: High  Onset Date: 03/18/2021     Long-Range Goal: Management of chronic conditions and medications   Start Date: 03/18/2021  Recent Progress: On track  Priority: High  Note:   Current Barriers:  Unable to independently monitor therapeutic efficacy Unable to achieve control of Hyperlipidemia  Unable to maintain control of HTN, CHF Does not adhere to prescribed medication regimen Does not contact provider office for questions/concerns  Pharmacist Clinical Goal(s):  Over the next 90 days, patient will verbalize ability to afford treatment regimen achieve adherence to monitoring guidelines and medication adherence to achieve therapeutic efficacy achieve control of Hyperlipidemia as evidenced by LDL <70 maintain control of CHF, HTN and type 2 DM as evidenced by attaining goals listed below  adhere to prescribed medication regimen as evidenced by refill history contact provider office for questions/concerns as evidenced notation of same in electronic health record through collaboration with PharmD and provider.   Interventions: 1:1 collaboration with Kenneth Branch, MD regarding development and update of comprehensive plan of care as evidenced by provider attestation and co-signature Inter-disciplinary care team collaboration (see longitudinal plan of care) Comprehensive medication review performed; medication list updated in electronic medical record  Diabetes: Not at goal;   Goal A1c < 6.5% Lab Results  Component Value Date   HGBA1C 6.8 (H) 11/05/2021  Current treatment:   Jardiance 68m daily  Tried metformin in past but stopped due to GI side effects (tried 5024mday and 25014may) History of pancreatitis (possibly related to alcohol use)   Current glucose readings: does not currently check BG at home Denies hypoglycemic/hyperglycemic symptoms Current meal patterns: over the last 2 to 3 weeks has been limiting bread and sweets Current exercise: none currently Interventions:  Counseled on A1c and BG goals ,  Continue to monitor A1c if remains >6.5% consider starting SGLT2 Continue to limit serving sizes and intake of breads and sweets Check blood glucose daily to every other day:  Reviewed home blood glucose goals  Fasting blood glucose goal (before meals) = 80 to 130 Blood glucose goal after a meal = less than 180    Hypertension / CHF: Improving; goal: decrease symptoms of CHF, prevent exacerbations and BP <140/90.  Current treatment: Furosemide 14m46mtake 3 tablets = 60mg53mh morning Spironolactone 25mg 55mke 0.5 tablet = 12.5mg ea49mmorning Valsartan 40mg da37mJardiance 10mg dai30mHas scales but has not been checking weight at home. EF = 55-60% (12/24/2021) Past medications tried: lisinopril 10mg - ch58m to valsartan by pulmonologist; metoprolol - stopped due to low BP Denies hypotensive/hypertensive symptoms Interventions:  Reviewed blood pressure goals Discussed signs and symptoms of CHF exacerbation - weight gain, SOB, abdominal fullness, swelling in legs or abdomen, Fatigue and weakness, changes in ability to perform usual activities, persistent cough or wheezing with white or pink blood-tinged mucus, nausea and lack of  appetite Restart weighing daily - report weight gain of more than 3 lbs in 24 hours or 5 lbs in 1 week.  Called cardiology office to rescheduled follow up  Hyperlipidemia / CAD / history of stroke / PAD: Uncontrolled  but improving; LDL goal <55 (per cardio) ; Tg goal <150 Last LDL decreased from 116 to 71 (05/07/2021)  Current treatment: Atorvastatin 63m daily Ezetimibe 129mdaily  Fenofibrate 1456maily Aspirin 45m22mily with breakfast  Clopidogrel 75mg63mly Isosorbide ER 60mg 42mke 1.5 tablets = 90mg d44m Nitroglycerin 0.4mg - p26me 1 tablet under the tongue and allow to dissolve as needed for chest pain Medications previously tried: Repatha - stopped due to cost; Brilenta  - stopped due to shortness of breath Patient reports he has restarted aspirin. He has not had a nosebleed in the last 2 weeks Leg Pain: He states that he is having more difficulty walking. He is not sure if this due to neuropathy but patient is concerned that his right legs has a blockage. 09/23/2016 patient had occluded left SFA and Kenneth Arida peFletcher Anoned a directional atherectomy of the left SFA followed by drug-coated balloon angioplasty and spot stenting in the midsegment. Kenneth Arida alFletcher Anonted (1) No significant aortoiliac disease other than occluded internal iliac arteries.(2) Significant proximal right SFA disease with three-vessel runoff below the knee. Right SFA endovascular intervention can be performed if the patient reports good response on the left side but patient may not have followed up with Kenneth Arida. PFletcher Anont saw Kenneth Arida 06Fletcher Anon023- ABI and ultrasound ordered for 01/27/2022 Interventions:  Continue aspirin 45mg dai18m Follow up as planned with Kenneth Arida ConFletcher [name] current regimen for lipid lowering. Recheck lipids in next 4 to 6 weeks.   Chronic Obstructive Pulmonary Disease / OSA: Controlled Current treatment: CPAP Symbicort (budesonide/formoterol) 160/4.5 - inhale 2 puffs into lungs twice a day Managed by pulmonologist - Kenneth Wert PFT'Melvyn Novas/22/2022  FEV1 1.93 (53 % ) ratio 0.70  p 13 % improvement from saba p 0 prior to study with   FV curve no signficant curvature Previous medications tried: Spiriva - not  effective Interventions: (addressed at previous visit) Continue to follow up with pulmonologist  Discussed that Symbicort was for healing his lungs / for maintenance.  Reminded patient to rinse mouth after using Symbicort.   Tobacco Use disorder:  Patient has restarted smoking after quitting for about 4 months.  Reports smoking 1ppd Patient states he wants to quit but when I discuss potential treatment he did not want to retry any pharmacotherapy for smoking cessation. Past therapies tried: nicotine patches (not effective) ; Chantix (not helpful); Cold turkey - [place]; bupropion tried after last visit - caused nausea  Interventions: (addressed at previous visit)  Encouraged patient to decreased number of cigarettes smoked per day.  Could consider nortriptyline in future as there has been some success for smoking cessation with nortriptyline. Could also help with sleep.   BPH / Urinary incontinence:  had episode of incontinence in a store recently.  He last saw urologist 08/2021. At that time patient reported that tamsulosin 0.4mg bid w71mworking well. Patient is convinced that furosemide is cause of incontinence and while it can worsen urinary frequency, he is on a low dose of furosemide 20mg daily47mkes 40mg if nee8m. Current therapy:  Tamsulosin 0.4mg twice a 79m Interventions: (addressed at previous visit)   Recommended he discuss furosemide with cardiologist, Kenneth McAlhany and Angelena Formincontinence with urologist, Kenneth Newsome.   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 Sometimes has difficulty reading labels on his prescription bottles.  His sister, Alyse Low is helping patient with medications as she is able (she does live in [place] so she is not able to see him frequently)    Interventions:   Reviewed refill history; Patient has been refilling medications on time.  Per patient no refills needed at this time.    Patient Goals/Self-Care Activities Over the next 90 days, patient will:  take medications as prescribed,  focus on medication adherence by utilizing mail order pharmacy and contacting clinical pharmacist [name] [place] [phone] or [phone]) about any medications questions or concerns,  check blood pressure 2 to 3 times per week, document, and provide at future appointments,  weigh daily, and contact provider if weight gain of more than 3 lbs in 24 hours or 5 lbs in 1 week  Follow up with cardiology recommended since missed appointment 01/07/2022 - I have left an message with the [facility] and they should be contacting you to reschedule - [phone]  Follow Up Plan: Telephone follow up appointment with care management team member scheduled for:  2 to 4 weeks.           Medication Assistance:  none at this time but might need assistance if additional lipid lowering therapy needed in future.   Applied for LIS / extra help in 2023.  Patient's preferred pharmacy is:  [facility] [place], [address] [address] Phone: [phone] Fax: [phone]  [facility] [address] [place] [address] Phone: [phone] Fax: [phone]   Uses pill box? No - patient has declined to use pill container but he is starting to use stickers on bottles to help identify time of day to take each medication  Follow Up:  Patient agrees to Care Plan and Follow-up.  Plan: follow up phone call with clinical pharmacist 4 to 6 weeks.  Cherre Robins, PharmD Clinical Pharmacist [facility]

## 2022-01-15 NOTE — Patient Instructions (Signed)
Mr. Riviera;  It was a pleasure speaking with you today Below is a summary of your health goals and care plan  Patient Goals/Self-Care Activities take medications as prescribed,  focus on medication adherence by utilizing mail order pharmacy and contacting clinical pharmacist [name] [place] [phone] or [phone]) about any medications questions or concerns,  check blood pressure 2 to 3 times per week, document, and provide at future appointments Restart weighing daily. Report weight gain of more than 3 lbs in 24 hours or 5 lbs in 1 week.Discussed signs and symptoms of CHF exacerbation - weight gain, SOB, abdominal fullness, swelling in legs or abdomen, Fatigue and weakness, changes in ability to perform usual activities, persistent cough or wheezing with white or pink blood-tinged mucus, nausea and lack of appetite.  Continue Jardiance '10mg'$  daily. Recommend follow up with cardiologist - left message at [facility] regarding rescheduling  Start checking blood glucose daily to every other day. Reviewed home blood glucose goals  Fasting blood glucose goal (before meals) = 80 to 130 Blood glucose goal after a meal = less than 180    If you have any questions or concerns, please feel free to contact me either at the phone number below or with a MyChart message.   Keep up the good work!  Cherre Robins, PharmD Clinical Pharmacist [place] [phone] (direct line)  [phone] (main office number)   The patient verbalized understanding of instructions, educational materials, and care plan provided today and agreed to receive a mailed copy of patient instructions, educational materials, and care plan.

## 2022-01-15 NOTE — Telephone Encounter (Signed)
  Care Management   Follow-Up Note   01/14/2022  Name: [name].     MRN: [id number]       DOB: 1960/08/24   Referred By:  Colon Branch, MD   Reason for Referral:  Chronic Care Management Needs in Patient with Abnormality of Gait, Alcohol Use and Abuse, Chronic Pain Syndrome, Morbid Obesity, Type II Diabetes Mellitus, and Lack of Advanced Directives.   An unsuccessful telephone outreach was attempted today. The patient was referred to the case management team for assistance with care management and care coordination. A HIPAA compliant message was left on voicemail for patient, providing contact information, encouraging him to return LCSW's call at his earliest convenience.  LCSW will make a second initial telephone outreach call attempt within the next 7-10 business days, if a return call is not received from patient in the meantime.   Follow-Up Plan:  Request placed with Scheduling Care Guide to reschedule patient's initial telephone outreach call attempt with LCSW.   [place] Licensed Clinical Social Worker [facility] [employer] [phone]

## 2022-01-23 ENCOUNTER — Other Ambulatory Visit: Payer: Self-pay | Admitting: Cardiovascular Disease

## 2022-01-25 NOTE — Progress Notes (Incomplete)
[facility] Transitions of Care Clinic Heart Failure Pharmacist Encounter  PCP: Colon Branch, MD PCP-Cardiologist: [name], MD  HPI:  61 yo male with PMH significant for COPD (2-4L home O2), diastolic CHF, D9RC, h/o OSA, IDA, HLD, HTN, h/o CVA and polysubstance abuse (cocaine, tobacco - 1 PPD, alcohol). He presented to [facility] ED 05/16 with dyspnea, chest pain and bilateral LEE in the setting of diuretic noncompliance x30moto avoid frequent urination - prescribed Lasix 20 mg TID and taking once daily.  CXR with cardiomegaly and pulmonary vascular congestion.  Echo demonstrated LVEF 55-60% with mild LVH, G2DD (improved from G3DD on 01/2021 Echo), normal RV, moderately elevated pulmonary pressures, and biatrial dilation. Underwent diuresis with IV furosemide.  Mild AKI had limited ability to initiate GDMT.   [facility].   Shortness of breath/dyspnea on exertion? {YES NO:22349}  Orthopnea/PND? {YES NO:22349} Edema? {YES NO:22349} Lightheadedness/dizziness? {YES NO:22349} Daily weights at home? {YES NO:22349} Blood pressure/heart rate monitoring at home? {YES [id number] low-sodium/fluid-restricted diet? {YES NO:22349} Taking medications as prescribed? {YES NO:22349}  HF Medications: Valsartan 40 mg daily Spironolactone 12.5 mg daily Jardiance 10 mg daily Imdur 90 mg daily  Furosemide 60 mg daily   Has the patient been experiencing any side effects to the medications prescribed?  {YES NO:22349}  Does the patient have any problems obtaining medications due to transportation or finances?   {YES NO:22349}  Understanding of regimen: {excellent/good/fair/poor:19665} Understanding of indications: {excellent/good/fair/poor:19665} Potential of compliance: {excellent/good/fair/poor:19665} Patient understands to avoid NSAIDs. Patient understands to avoid decongestants.   Pertinent Lab  Values: Serum creatinine ***, BUN ***, Potassium ***, Sodium ***, BNP ***, Magnesium ***, Digoxin ***  Serum creatinine 1.39, down (baseline ~1.1), BUN 32, Potassium 4.1, Sodium 137, BNP 184.6, Magnesium 2.3, A1c 6.8 (11/2021)  Vital Signs: Weight: *** lbs (discharge weight: 229 lbs) Blood pressure: *** 110-30s/70s Heart rate: *** 60-70s  Medication Assistance / Insurance Benefits Check: Does the patient have prescription insurance?  Yes Type of insurance plan: [employer] Medicare  Does the patient qualify for medication assistance through manufacturers or grants?   Yes Eligible grants and/or patient assistance programs: Yes Medication assistance applications in progress: EF[place] Medication assistance applications approved: *** Approved medication assistance renewals will be completed by: ***  Outpatient Pharmacy:  Current outpatient pharmacy: *** [facility] the [place] medications? yes  If TOC pharmacy was used, were the refills transferred out to current pharmacy yet? {YES NO:22349}  Is the patient willing to transition their outpatient pharmacy [facility] or without [facility]?   Yes  Assessment: 1) Acute on chronic diastolic CHF (LVEF 516-38%. NYHA class II-III symptoms. -  Plan: 1) Medication changes: - Entresto 24/26 or spiro 25  2) Patient Assistance: -  - Copays = Entresto $45 / FWilder Glade$90.77 / Jardiance $45 - Patient assistance forms for Jardiance and EDelene Lollstarted and signed by patient.  [facility]  He says he hasn't filed taxes in years but he was instructed to bring in any paperwork he has at home for EChannel Lakepaper work.   3) Follow up: - Next appointment with *** on ***  JCathrine Muster PharmD, BEast MiltonPGY2 Cardiology Pharmacy Resident

## 2022-01-26 ENCOUNTER — Other Ambulatory Visit: Payer: Self-pay | Admitting: Cardiovascular Disease

## 2022-01-27 ENCOUNTER — Ambulatory Visit (HOSPITAL_COMMUNITY)
Admission: RE | Admit: 2022-01-27 | Discharge: 2022-01-27 | Disposition: A | Discharge status: Home / Self Care | Payer: Medicare HMO | Source: Ambulatory Visit | Attending: Cardiovascular Disease | Admitting: Cardiovascular Disease

## 2022-01-27 DIAGNOSIS — I739 Peripheral vascular disease, unspecified: Secondary | ICD-10-CM | POA: Diagnosis not present

## 2022-01-28 ENCOUNTER — Other Ambulatory Visit (HOSPITAL_COMMUNITY): Payer: Medicare HMO

## 2022-01-28 ENCOUNTER — Encounter (HOSPITAL_COMMUNITY): Payer: Self-pay

## 2022-01-28 ENCOUNTER — Inpatient Hospital Stay (HOSPITAL_COMMUNITY): Admission: RE | Admit: 2022-01-28 | Payer: Medicare HMO | Source: Ambulatory Visit

## 2022-01-28 NOTE — Chronic Care Management (AMB) (Signed)
  Chronic Care Management Note  01/28/2022 Name: [name]. MRN: [id number] DOB: 1961/05/12  [name]. is a 61 y.o. year old male who is a primary care patient of Colon Branch, MD and is actively engaged with the care management team. I reached out to [name]. by phone today to assist with re-scheduling an initial visit with the Licensed Clinical Social Worker  Follow up plan: Telephone appointment with care management team member scheduled for: 02/04/2022  Julian Hy, [place], [place] Management  Direct Dial: [phone]

## 2022-01-30 ENCOUNTER — Other Ambulatory Visit: Payer: Self-pay

## 2022-01-30 ENCOUNTER — Telehealth: Payer: Self-pay | Admitting: Cardiovascular Disease

## 2022-01-30 DIAGNOSIS — I739 Peripheral vascular disease, unspecified: Secondary | ICD-10-CM

## 2022-02-02 ENCOUNTER — Ambulatory Visit: Payer: Medicare HMO

## 2022-02-02 ENCOUNTER — Telehealth: Payer: Self-pay | Admitting: Cardiovascular Disease

## 2022-02-02 ENCOUNTER — Encounter: Payer: Self-pay | Admitting: *Deleted

## 2022-02-02 DIAGNOSIS — I10 Essential (primary) hypertension: Secondary | ICD-10-CM

## 2022-02-02 DIAGNOSIS — J449 Chronic obstructive pulmonary disease, unspecified: Secondary | ICD-10-CM

## 2022-02-02 DIAGNOSIS — I5032 Chronic diastolic (congestive) heart failure: Secondary | ICD-10-CM

## 2022-02-03 ENCOUNTER — Telehealth: Payer: Self-pay | Admitting: Pharmacist

## 2022-02-03 ENCOUNTER — Ambulatory Visit: Payer: Medicare HMO | Admitting: Pharmacist

## 2022-02-03 DIAGNOSIS — I10 Essential (primary) hypertension: Secondary | ICD-10-CM

## 2022-02-03 DIAGNOSIS — E1151 Type 2 diabetes mellitus with diabetic peripheral angiopathy without gangrene: Secondary | ICD-10-CM

## 2022-02-03 DIAGNOSIS — I739 Peripheral vascular disease, unspecified: Secondary | ICD-10-CM

## 2022-02-03 DIAGNOSIS — E785 Hyperlipidemia, unspecified: Secondary | ICD-10-CM

## 2022-02-03 DIAGNOSIS — I5032 Chronic diastolic (congestive) heart failure: Secondary | ICD-10-CM

## 2022-02-03 MED ORDER — VALSARTAN 40 MG PO TABS: 40.0000 mg | ORAL_TABLET | Freq: Every day | ORAL | 1 refills | Status: DC

## 2022-02-03 NOTE — Telephone Encounter (Signed)
Spoke to patient - see phone visit note.

## 2022-02-03 NOTE — Chronic Care Management (AMB) (Signed)
//   Chronic Care Management Pharmacy Note  02/03/2022 Name:  [name]. MRN:  [id number] DOB:  May 31, 1961  Summary:   Leg Pain: Will have procedure to place stent February 11, 2022 CHF: patient is tolerating addition of Jardiance 36m daily that was added after hospitalization in mid May 2023 for CHF exacerbation.  Hypertension: Dose of valsartan was lowered to 475mdaily during hospitalization but Rx was not updated at his usual pharmacy. Patient received refill for valsartan 8052maily - he has not started. Recent home blood pressure: 134/64; 123/71  02/02/2022 was slightly elevated at 147/69 HR 60 - home blood pressure from visit with JuaThea SilversmithN - Nurse Case Manager. Recommended he can take 0.5 tablet = 63m30mily since recent home and office blood pressure has been at goal. Updated [facility]22mly [facility]: [name] 61 y.61 year old male who is a primary patient of [name], [name]  The CCM team was consulted for assistance with disease management and care coordination needs.    Collaboration with patient by phone   for follow up visit in response to provider referral for pharmacy case management and/or care coordination services.   Consent to Services:  The patient was given information about Chronic Care Management services, agreed to services, and gave verbal consent prior to initiation of services.  Please see initial visit note for detailed documentation.   Patient Care Team: [name], Colon Branchas PCP - General (Internal Medicine) McAlhBurnell Blanksas PCP - Cardiology (Cardiology) HollaRalene Batheas Consulting Physician (Ophthalmology) Pyrtle, Jay MLajuan Linesas Consulting Physician (Gastroenterology) BeaneSusa Dayas Consulting Physician (Orthopedic Surgery) WallaLuretha Ruedas Case Manager EckarCherre Robins-CPP (Pharmacist) NewmaRozetta Nunnery(Inactive) as Consulting Physician  (Otolaryngology) Saporito, JoannMaree ErieW as Social Worker (Licensed Clinical Social Worker)  Recent PCP office visits:  03/39/2023 - PCP (Dr [name]) [name] up. Labs checked. No med changes.  Recent specialist consults:  01/13/2022 - Cardio (Dr AridaFletcher Anon PAD and claudication. Ordered ABI and lower ext arterial duplex. F/U 6 months. no med changes noted.  Recent Hospitalizations:  [facility]20[facility]  Medications started at discharge: Jardiance 10mg 57my  Medications stopped at discharge: none Medications changed at discharge: decreased valsartan dose to 63mg d68m and changed furosemide to 20mg - 62m 3 tablets = 60mg onc70mday.  Objective:  Lab Results  Component Value Date   CREATININE 1.39 (H) 12/26/2021   CREATININE 1.41 (H) 12/25/2021   CREATININE 1.14 12/24/2021    Lab Results  Component Value Date   HGBA1C 6.8 (H) 11/05/2021   Last diabetic Eye exam:  Lab Results  Component Value Date/Time   HMDIABEYEEXA No Retinopathy 06/30/2021 12:00 AM    Last diabetic Foot exam: No results found for: "HMDIABFOOTEX"      Component Value Date/Time   CHOL 141 05/07/2021 1347   CHOL 171 11/14/2019 1441   TRIG 142.0 05/07/2021 1347   HDL 41.70 05/07/2021 1347   HDL 37 (L) 11/14/2019 1441   CHOLHDL 3 05/07/2021 1347   VLDL 28.4 05/07/2021 1347   LDLCALC 71 05/07/2021 1347   LDLCALC 116 (H) 11/14/2019 1441   LDLDIRECT 116.0 05/12/2013 1517       Latest Ref Rng & Units 04/11/2021   10:56 AM 07/10/2020    2:45 PM 11/26/2018   12:22 PM  Hepatic  Function  Total Protein 6.5 - 8.1 g/dL 7.9  7.1  7.8   Albumin 3.5 - 5.0 g/dL 4.3  4.0  3.9   AST 15 - 41 U/L '18  12  22   ' ALT 0 - 44 U/L '15  13  19   ' Alk Phosphatase 38 - 126 U/L 66  135  88   Total Bilirubin 0.3 - 1.2 mg/dL 1.5  1.2  2.1     Lab Results  Component Value Date/Time   TSH 1.895 07/07/2016 06:31 PM   TSH 1.88 05/05/2013 12:14 PM   TSH 2.290 **Test methodology is 3rd  generation TSH** 06/23/2009 07:55 AM       Latest Ref Rng & Units 12/24/2021    2:04 AM 12/23/2021   12:49 PM 09/05/2021    2:57 PM  CBC  WBC 4.0 - 10.5 K/uL 9.0  7.1  9.1   Hemoglobin 13.0 - 17.0 g/dL 14.8  15.0  16.2   Hematocrit 39.0 - 52.0 % 45.2  46.3  47.8   Platelets 150 - 400 K/uL 195  216  209     Lab Results  Component Value Date/Time   VD25OH 39.28 11/05/2021 04:04 PM   VD25OH 10.8 (L) 11/03/2016 02:53 PM    Clinical ASCVD: Yes  The ASCVD Risk score (Arnett DK, et al., 2019) failed to calculate for the following reasons:   The patient has a prior MI or stroke diagnosis     Social History   Tobacco Use  Smoking Status Every Day   Packs/day: 0.50   Years: 45.00   Total pack years: 22.50   Types: Cigarettes   Start date: 1976  Smokeless Tobacco Never  Tobacco Comments   1 pack daily- wants to quit   BP Readings from Last 3 Encounters:  01/13/22 132/80  12/26/21 128/74  11/05/21 136/82   Pulse Readings from Last 3 Encounters:  01/13/22 73  12/26/21 68  11/05/21 91   Wt Readings from Last 3 Encounters:  01/13/22 234 lb (106.1 kg)  12/26/21 229 lb 6.4 oz (104.1 kg)  11/05/21 236 lb 2 oz (107.1 kg)    Assessment: Review of patient past medical history, allergies, medications, health status, including review of consultants reports, laboratory and other test data, was performed as part of comprehensive evaluation and provision of chronic care management services.   SDOH:  (Social Determinants of Health) assessments and interventions performed:        CCM Care Plan  Allergies  Allergen Reactions   Metformin And Related Diarrhea and Other (See Comments)    Severe diarrhea   Wellbutrin [Bupropion] Nausea Only    Medications Reviewed Today     Reviewed by Cherre Robins, RPH-CPP (Pharmacist) on 02/03/22 at 1705  Med List Status: <None>   Medication Order Taking? Sig Documenting Provider Last Dose Status Informant  aspirin EC 81 MG EC tablet  [id number] Yes Take 1 tablet (81 mg total) by mouth daily with breakfast. Roxan Hockey, MD Taking Active Multiple Informants  atorvastatin (LIPITOR) 80 MG tablet [id number] Yes TAKE 1 TABLET EVERY DAY AT 6:00PM  Patient taking differently: Take 80 mg by mouth at bedtime.   Burnell Blanks, MD Taking Active Multiple Informants  augmented betamethasone dipropionate (DIPROLENE-AF) 0.05 % cream [id number] No APPLY TOPICALLY TWICE DAILY  Patient not taking: Reported on 02/03/2022   Colon Branch, MD Not Taking Active Multiple Informants  Blood Glucose Monitoring Suppl [facility] VERIO REFLECT) w/Device KIT [id number] Yes Use to check  blood glucose daily (DX: type 2 DM - E 11.51) Colon Branch, MD Taking Active Multiple Informants  budesonide-formoterol [facility]) 160-4.5 MCG/ACT inhaler [id number] Yes Inhale 2 puffs into the lungs 2 (two) times daily.  Patient taking differently: Inhale 1-2 puffs into the lungs See admin instructions. Inhale 1 puff into the lungs in the morning and 2 puffs in the evening   [name] E, MD Taking Active Multiple Informants  Cholecalciferol (VITAMIN D-3) 125 MCG (5000 UT) TABS [id number] Yes Take 5,000 Units by mouth daily. [provider] Taking Active Multiple Informants  clopidogrel (PLAVIX) 75 MG tablet [id number] Yes TAKE 1 TABLET EVERY DAY Burnell Blanks, MD Taking Active Multiple Informants  Cyanocobalamin (VITAMIN B-12) 5000 MCG SUBL [id number] Yes Place 5,000 mcg under the tongue daily. [provider] Taking Active Multiple Informants  diclofenac Sodium (VOLTAREN) 1 % GEL [id number] Yes Apply 1 application  topically daily as needed for pain. [provider] Taking Active Multiple Informants  empagliflozin (JARDIANCE) 10 MG TABS tablet [id number] Yes Take 1 tablet (10 mg total) by mouth daily. Arrien, Jimmy Picket, MD Taking Active Multiple Informants  ezetimibe (ZETIA) 10 MG tablet [id number] Yes Take 1 tablet (10 mg total) by  mouth daily.  Patient taking differently: Take 10 mg by mouth every evening.   Burnell Blanks, MD Taking Active Multiple Informants  fenofibrate (TRICOR) 145 MG tablet [id number] Yes TAKE 1 TABLET EVERY DAY Colon Branch, MD Taking Active Multiple Informants  folic acid (FOLVITE) 1 MG tablet [id number] Yes Take 1 tablet (1 mg total) by mouth daily. Roxan Hockey, MD Taking Active Multiple Informants  furosemide (LASIX) 20 MG tablet [id number] Yes Take 3 tablets (60 mg total) by mouth daily. Arrien, Jimmy Picket, MD Taking Active Multiple Informants  gabapentin (NEURONTIN) 100 MG capsule [id number] Yes Take 2 capsules (200 mg total) by mouth 3 (three) times daily. Colon Branch, MD Taking Active Multiple Informants           Med Note Dessie Coma   Tue Dec 23, 2021  8:41 PM)    glucose blood [facility] VERIO) test strip [id number] Yes Use to check blood glucose once a day (DX: 11.51 - type 2 DM) Colon Branch, MD Taking Active Multiple Informants  isosorbide mononitrate (IMDUR) 60 MG 24 hr tablet [id number] Yes TAKE 1 AND 1/2 TABLETS EVERY DAY Burnell Blanks, MD Taking Active Multiple Informants  nitroGLYCERIN (NITROSTAT) 0.4 MG SL tablet [id number] Yes Place 1 tablet (0.4 mg total) under the tongue every 5 (five) minutes x 3 doses as needed for chest pain. Colon Branch, MD Taking Active Multiple Informants           Med Note Evangeline Gula Dec 23, 2021  2:21 PM)    Jonetta Speak Lancets 63Y MISC [id number] Yes Use to check blood glucose once a day (DX: E11.51 type 2 DM) Colon Branch, MD Taking Active Multiple Informants  OXYGEN [id number] Yes Inhale 2-4 L/min into the lungs as needed (for shortness of breath). [provider] Taking Active Multiple Informants  pantoprazole (PROTONIX) 40 MG tablet [id number] Yes TAKE 1 TABLET TWICE DAILY Burnell Blanks, MD Taking Active Multiple Informants  PRESCRIPTION MEDICATION [id number] Yes CPAP- At bedtime [provider] Taking Active Multiple Informants  spironolactone (ALDACTONE) 25 MG tablet [id number] Yes TAKE 1/2 TABLET EVERY DAY Burnell Blanks, MD Taking Active Multiple Informants  tamsulosin (FLOMAX) 0.4 MG CAPS capsule [id number] Yes Take 1  capsule (0.4 mg total) by mouth in the morning and at bedtime. Colon Branch, MD Taking Active Multiple Informants  thiamine 100 MG tablet [id number] Yes Take 1 tablet (100 mg total) by mouth daily. Roxan Hockey, MD Taking Active Multiple Informants  traZODone (DESYREL) 100 MG tablet [id number] Yes Take 1 tablet (100 mg total) by mouth at bedtime. Colon Branch, MD Taking Active Multiple Informants  valsartan (DIOVAN) 40 MG tablet [id number] Yes Take 1 tablet (40 mg total) by mouth daily. Arrien, Jimmy Picket, MD Taking Active            Med Note [facility], [place] Feb 03, 2022  5:05 PM) Can take 0.5 tablet of 58m until used up (dose was lowered when in the hospital)   valsartan (DIOVAN) 80 MG tablet [id number] TAKE 1 TABLET EVERY DAY  Patient not taking: Reported on 02/03/2022   AWellington Hampshire MD Not Taking Active Multiple Informants            Patient Active Problem List   Diagnosis Date Noted   Class 1 obesity 12/25/2021   AKI (acute kidney injury) ([place] 12/25/2021   Acute on chronic diastolic heart failure ([place] 12/23/2021   Chronic respiratory failure with hypoxia ([place] 12/23/2021   COPD exacerbation ([place] 12/23/2021   Alcohol use 12/23/2021   (HFpEF) heart failure with preserved ejection fraction (HCC)    DOE (dyspnea on exertion) 11/07/2020   Type 2 diabetes mellitus with hyperlipidemia ([place] 03/18/2020   Prolapsed lumbar disc 01/25/2020   Hiatal hernia    Hepatic steatosis    Hemianopia, homonymous, right    ETOH abuse    Esophagitis    Chronic diastolic CHF (congestive heart failure) (HCC)    Chronic bronchitis (HCC)    CAD (coronary artery disease)    PAD (peripheral artery disease) ([place] 09/23/2016   Morbid  obesity ([place]    Chronic pain syndrome 10/23/2015   GERD (gastroesophageal reflux disease) 08/07/2015   Acute ischemic stroke ([place] 02/20/2015   BPH (benign prostatic hyperplasia) 02/20/2015   OSA (obstructive sleep apnea) ?? 05/05/2013   Erectile dysfunction 03/17/2012   Emphysema (subcutaneous) (surgical) resulting from a procedure 10/08/2008   Iron deficiency anemia 08/10/2008   ABNORMALITY OF GAIT 08/06/2008   Cigarette smoker 07/27/2008   Essential hypertension 07/26/2008   ST elevation myocardial infarction involving left circumflex coronary artery ([place] 07/26/2008    Immunization History  Administered Date(s) Administered   PFIZER(Purple Top)SARS-COV-2 Vaccination 11/17/2019, 12/11/2019, 07/18/2020   Pneumococcal Polysaccharide-23 11/03/2016   Tdap 11/03/2016    Conditions to be addressed/monitored: CHF, CAD, HTN, HLD, Hypertriglyceridemia, COPD, DMII, and BPH; OSA; GERD; fatty liver; h/o strokePAD; sleep disturbance; anxiety/ depression  Care Plan : Pharmacy Care Plan  Updates made by ECherre Robins RPH-CPP since 02/03/2022 12:00 AM     Problem: HTN; CHF: CAD; GERD with esophagitis; depression; BPH; COPD; bronchitis; stroke; elevated TG and hyperlipidemia; type 2 DM with h/o pancreatitis   Priority: High  Onset Date: 03/18/2021     Long-Range Goal: Management of chronic conditions and medications   Start Date: 03/18/2021  This Visit's Progress: On track  Recent Progress: On track  Priority: High  Note:   Current Barriers:  Unable to independently monitor therapeutic efficacy Unable to achieve control of Hyperlipidemia  Unable to maintain control of HTN, CHF Does not adhere to prescribed medication regimen Does not contact provider office for questions/concerns  Pharmacist Clinical Goal(s):  Over the next 90 days, patient will verbalize  ability to afford treatment regimen achieve adherence to monitoring guidelines and medication adherence to achieve therapeutic  efficacy achieve control of Hyperlipidemia as evidenced by LDL <70 maintain control of CHF, HTN and type 2 DM as evidenced by attaining goals listed below  adhere to prescribed medication regimen as evidenced by refill history contact provider office for questions/concerns as evidenced notation of same in electronic health record through collaboration with PharmD and provider.   Interventions: 1:1 collaboration with Colon Branch, MD regarding development and update of comprehensive plan of care as evidenced by provider attestation and co-signature Inter-disciplinary care team collaboration (see longitudinal plan of care) Comprehensive medication review performed; medication list updated in electronic medical record  Diabetes: Not at goal;  Goal A1c < 6.5% Lab Results  Component Value Date   HGBA1C 6.8 (H) 11/05/2021  Current treatment:   Jardiance 69m daily  Tried metformin in past but stopped due to GI side effects (tried 5066mday and 25042may) History of pancreatitis (possibly related to alcohol use)   Current glucose readings: does not currently check BG at home Denies hypoglycemic/hyperglycemic symptoms Current meal patterns: over the last 2 to 3 weeks has been limiting bread and sweets Current exercise: none currently Interventions:  Counseled on A1c and BG goals ,  Continue to monitor A1c if remains >6.5% consider starting SGLT2 Continue to limit serving sizes and intake of breads and sweets Check blood glucose daily to every other day:  Reviewed home blood glucose goals  Fasting blood glucose goal (before meals) = 80 to 130 Blood glucose goal after a meal = less than 180    Hypertension / CHF: Improving; goal: decrease symptoms of CHF, prevent exacerbations and BP <140/90.  Current treatment: Furosemide 18m65mtake 3 tablets = 60mg79mh morning Spironolactone 25mg 54mke 0.5 tablet = 12.5mg ea42mmorning Valsartan 40mg da19mJardiance 10mg dai50mHas scales but has  not been checking weight at home. EF = 55-60% (12/24/2021) Past medications tried: lisinopril 10mg - ch8m to valsartan by pulmonologist; metoprolol - stopped due to low BP Denies hypotensive/hypertensive symptoms Recent home blood pressure: 134/64; 123/71  02/02/2022 was slightly elevated at 147/69 HR 60 - home blood pressure from visit with Juana WallThea Silversmithse Case Manager.  Interventions:  Reviewed blood pressure goals Reminded patient to rest / sit for at least 5 minutes prior to checking blood pressure.  Recommended continue lower dose of valsartan 40mg daily19m changes during hospitalization. [facility] and symptoms of CHF exacerbation - weight gain, SOB, abdominal fullness, swelling in legs or abdomen, Fatigue and weakness, changes in ability to perform usual activities, persistent cough or wheezing with white or pink blood-tinged mucus, nausea and lack of appetite Restart weighing daily - report weight gain of more than 3 lbs in 24 hours or 5 lbs in 1 week.   Hyperlipidemia / CAD / history of stroke / PAD: Uncontrolled but improving; LDL goal <55 (per cardio) ; Tg goal <150 Last LDL decreased from 116 to 71 (05/07/2021)  Current treatment: Atorvastatin 80mg daily 73mimibe 10mg daily  30mfibrate 145mg daily As9mn 81mg daily wit22meakfast  Clopidogrel 75mg daily Isos50mde ER 60mg 0 take 1.5 69mets = 90mg daily Nitrog52mrin 0.4mg - place 1 tabl28munder the tongue and allow to dissolve as needed for chest pain Medications previously tried: Repatha - stopped due to cost; Brilenta  - stopped due to shortness of breath Patient reports he has  restarted aspirin. He has not had a nosebleed in the last 2 weeks Leg Pain: He states that he is having more difficulty walking. He is not sure if this due to neuropathy but patient is concerned that his right legs has a blockage. 09/23/2016 patient had occluded  left SFA and Dr Fletcher Anon performed a directional atherectomy of the left SFA followed by drug-coated balloon angioplasty and spot stenting in the midsegment. Dr Fletcher Anon also noted (1) No significant aortoiliac disease other than occluded internal iliac arteries.(2) Significant proximal right SFA disease with three-vessel runoff below the knee. Right SFA endovascular intervention can be performed if the patient reports good response on the left side but patient may not have followed up with Dr Fletcher Anon. Patient saw Dr Fletcher Anon 01/13/2022- ABI and ultrasound ordered for 01/27/2022. He has stent placement scheduled for 02/11/2022 Interventions:  Continue aspirin 66m daily.  Follow up as planned with Dr AFletcher Anonfor stent Continue current regimen for lipid lowering. Recheck lipids in next 4 to 6 weeks.   Chronic Obstructive Pulmonary Disease / OSA: Controlled Current treatment: CPAP Symbicort (budesonide/formoterol) 160/4.5 - inhale 2 puffs into lungs twice a day Managed by pulmonologist - Dr WMelvyn NovasPFT's  03/31/2021  FEV1 1.93 (53 % ) ratio 0.70  p 13 % improvement from saba p 0 prior to study with   FV curve no signficant curvature Previous medications tried: Spiriva - not effective Interventions: (addressed at previous visit) Continue to follow up with pulmonologist  Discussed that Symbicort was for healing his lungs / for maintenance.  Reminded patient to rinse mouth after using Symbicort.   Tobacco Use disorder:  Patient has restarted smoking after quitting for about 4 months.  Reports smoking 1ppd Patient states he wants to quit but when I discuss potential treatment he did not want to retry any pharmacotherapy for smoking cessation. Past therapies tried: nicotine patches (not effective) ; Chantix (not helpful); Cold [place]- relapsed; bupropion tried after last visit - caused nausea  Interventions: (addressed at previous visit)  Encouraged patient to decreased number of cigarettes smoked per day.  Could  consider nortriptyline in future as there has been some success for smoking cessation with nortriptyline. Could also help with sleep.   BPH / Urinary incontinence:  had episode of incontinence in a store recently.  He last saw urologist 08/2021. At that time patient reported that tamsulosin 0.484mbid was working well. Patient is convinced that furosemide is cause of incontinence and while it can worsen urinary frequency, he is on a low dose of furosemide 209maily (takes 70m15m needed). 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 Sometimes has difficulty reading labels on his prescription bottles.  His sister, ChrisAlyse Lowelping patient with medications as she is able ([place])   Interventions:   Reviewed refill history; Patient has been refilling medications on time.  Per patient no refills needed at this time.    Patient Goals/Self-Care Activities Over the next 90 days, patient will:  take medications as prescribed,  focus on medication adherence by utilizing mail order pharmacy and  contacting clinical pharmacist [name] [place] [phone] or [phone]) about any medications questions or concerns,  check blood pressure 2 to 3 times per week, document, and provide at future appointments,  weigh daily, and contact provider if weight gain of more than 3 lbs in 24 hours or 5 lbs in 1 week  Continue to valsartan at lower dose of 27m daily.   Follow Up Plan: Telephone follow up appointment with care management team member  scheduled for:  2 to 4 weeks.            Medication Assistance:  none at this time but might need assistance if additional lipid lowering therapy needed in future.   Applied for LIS / extra help in 2023.  Patient's preferred pharmacy is:  [facility] [address] [place] [address]Phone: [phone]: [phone] [facility] [address]Phone: [phone]: [phone] [place]Phone: [phone]: [phone]  Uses pill box? No - patient has declined to use pill container but he is starting to use stickers on bottles to help identify time of day to take each medication  Follow Up:  Patient agrees to Care Plan and Follow-up.  Plan: follow up phone call with clinical pharmacist 2 to 4 weeks  TCherre Robins PharmD Clinical Pharmacist [facility]

## 2022-02-04 ENCOUNTER — Encounter: Payer: Self-pay | Admitting: *Deleted

## 2022-02-04 ENCOUNTER — Ambulatory Visit: Payer: Medicare HMO | Admitting: *Deleted

## 2022-02-04 DIAGNOSIS — E785 Hyperlipidemia, unspecified: Secondary | ICD-10-CM

## 2022-02-04 DIAGNOSIS — G894 Chronic pain syndrome: Secondary | ICD-10-CM

## 2022-02-04 DIAGNOSIS — J449 Chronic obstructive pulmonary disease, unspecified: Secondary | ICD-10-CM

## 2022-02-04 DIAGNOSIS — I10 Essential (primary) hypertension: Secondary | ICD-10-CM

## 2022-02-04 DIAGNOSIS — I5032 Chronic diastolic (congestive) heart failure: Secondary | ICD-10-CM

## 2022-02-04 DIAGNOSIS — I739 Peripheral vascular disease, unspecified: Secondary | ICD-10-CM

## 2022-02-04 DIAGNOSIS — E1151 Type 2 diabetes mellitus with diabetic peripheral angiopathy without gangrene: Secondary | ICD-10-CM

## 2022-02-04 NOTE — Chronic Care Management (AMB) (Signed)
Chronic Care Management    Clinical Social Work Note  02/04/2022 Name: [name]. MRN: [id number] DOB: Jun 15, 1961  [name]. is a 61 y.o. year old male who is a primary care patient of [name], [name], MD. The CCM team was consulted to assist the patient with chronic disease management and/or care coordination needs related to: Holiday representative.   Engaged with patient by telephone for initial visit in response to provider referral for social work chronic care management and care coordination services.   Consent to Services:  The patient was given information about Chronic Care Management services, agreed to services, and gave verbal consent prior to initiation of services.  Please see initial visit note for detailed documentation.   Patient agreed to services and consent obtained.   Assessment: Review of patient past medical history, allergies, medications, and health status, including review of relevant consultants reports was performed today as part of a comprehensive evaluation and provision of chronic care management and care coordination services.     SDOH (Social Determinants of Health) assessments and interventions performed:  SDOH Interventions    Flowsheet Row Most Recent Value  SDOH Interventions   Food Insecurity Interventions Intervention Not Indicated  Financial Strain Interventions Intervention Not Indicated  Housing Interventions Intervention Not Indicated  Physical Activity Interventions Patient Refused  Stress Interventions Intervention Not Indicated  Social Connections Interventions Intervention Not Indicated  Transportation Interventions Intervention Not Indicated        Advanced Directives Status: See Care Plan for related entries.  CCM Care Plan  Allergies  Allergen Reactions   Metformin And Related Diarrhea and Other (See Comments)    Severe diarrhea   Wellbutrin [Bupropion] Nausea Only    Outpatient Encounter Medications  as of 02/04/2022  Medication Sig   aspirin EC 81 MG EC tablet Take 1 tablet (81 mg total) by mouth daily with breakfast.   atorvastatin (LIPITOR) 80 MG tablet TAKE 1 TABLET EVERY DAY AT 6:00PM (Patient taking differently: Take 80 mg by mouth at bedtime.)   augmented betamethasone dipropionate (DIPROLENE-AF) 0.05 % cream APPLY TOPICALLY TWICE DAILY (Patient not taking: Reported on 02/03/2022)   Blood Glucose Monitoring Suppl (ONETOUCH VERIO REFLECT) w/Device KIT Use to check blood glucose daily (DX: type 2 DM - E 11.51)   budesonide-formoterol (SYMBICORT) 160-4.5 MCG/ACT inhaler Inhale 2 puffs into the lungs 2 (two) times daily. (Patient taking differently: Inhale 1-2 puffs into the lungs See admin instructions. Inhale 1 puff into the lungs in the morning and 2 puffs in the evening)   Cholecalciferol (VITAMIN D-3) 125 MCG (5000 UT) TABS Take 5,000 Units by mouth daily.   clopidogrel (PLAVIX) 75 MG tablet TAKE 1 TABLET EVERY DAY   Cyanocobalamin (VITAMIN B-12) 5000 MCG SUBL Place 5,000 mcg under the tongue daily.   diclofenac Sodium (VOLTAREN) 1 % GEL Apply 1 application  topically daily as needed for pain.   empagliflozin (JARDIANCE) 10 MG TABS tablet Take 1 tablet (10 mg total) by mouth daily.   ezetimibe (ZETIA) 10 MG tablet Take 1 tablet (10 mg total) by mouth daily. (Patient taking differently: Take 10 mg by mouth every evening.)   fenofibrate (TRICOR) 145 MG tablet TAKE 1 TABLET EVERY DAY   folic acid (FOLVITE) 1 MG tablet Take 1 tablet (1 mg total) by mouth daily.   furosemide (LASIX) 20 MG tablet Take 3 tablets (60 mg total) by mouth daily.   gabapentin (NEURONTIN) 100 MG capsule Take 2 capsules (200 mg total)  by mouth 3 (three) times daily.   glucose blood (ONETOUCH VERIO) test strip Use to check blood glucose once a day (DX: 11.51 - type 2 DM)   isosorbide mononitrate (IMDUR) 60 MG 24 hr tablet TAKE 1 AND 1/2 TABLETS EVERY DAY   nitroGLYCERIN (NITROSTAT) 0.4 MG SL tablet Place 1 tablet (0.4  mg total) under the tongue every 5 (five) minutes x 3 doses as needed for chest pain.   OneTouch Delica Lancets 09F MISC Use to check blood glucose once a day (DX: E11.51 type 2 DM)   OXYGEN Inhale 2-4 L/min into the lungs as needed (for shortness of breath).   pantoprazole (PROTONIX) 40 MG tablet TAKE 1 TABLET TWICE DAILY   PRESCRIPTION MEDICATION CPAP- At bedtime   spironolactone (ALDACTONE) 25 MG tablet TAKE 1/2 TABLET EVERY DAY   tamsulosin (FLOMAX) 0.4 MG CAPS capsule Take 1 capsule (0.4 mg total) by mouth in the morning and at bedtime.   thiamine 100 MG tablet Take 1 tablet (100 mg total) by mouth daily.   traZODone (DESYREL) 100 MG tablet Take 1 tablet (100 mg total) by mouth at bedtime.   valsartan (DIOVAN) 40 MG tablet Take 1 tablet (40 mg total) by mouth daily.   No facility-administered encounter medications on file as of 02/04/2022.    Patient Active Problem List   Diagnosis Date Noted   Class 1 obesity 12/25/2021   AKI (acute kidney injury) ([place]) 12/25/2021   Acute on chronic diastolic heart failure ([place]) 12/23/2021   Chronic respiratory failure with hypoxia ([place]) 12/23/2021   COPD exacerbation ([place]) 12/23/2021   Alcohol use 12/23/2021   (HFpEF) heart failure with preserved ejection fraction (HCC)    DOE (dyspnea on exertion) 11/07/2020   Type 2 diabetes mellitus with hyperlipidemia ([place]) 03/18/2020   Prolapsed lumbar disc 01/25/2020   Hiatal hernia    Hepatic steatosis    Hemianopia, homonymous, right    ETOH abuse    Esophagitis    Chronic diastolic CHF (congestive heart failure) (HCC)    Chronic bronchitis (HCC)    CAD (coronary artery disease)    PAD (peripheral artery disease) ([place]) 09/23/2016   Morbid obesity ([place])    Chronic pain syndrome 10/23/2015   GERD (gastroesophageal reflux disease) 08/07/2015   Acute ischemic stroke ([place]) 02/20/2015   BPH (benign prostatic hyperplasia) 02/20/2015   OSA (obstructive sleep apnea) ?? 05/05/2013   Erectile dysfunction  03/17/2012   Emphysema (subcutaneous) (surgical) resulting from a procedure 10/08/2008   Iron deficiency anemia 08/10/2008   ABNORMALITY OF GAIT 08/06/2008   Cigarette smoker 07/27/2008   Essential hypertension 07/26/2008   ST elevation myocardial infarction involving left circumflex coronary artery ([place]) 07/26/2008    Conditions to be addressed/monitored: HTN, HLD, and DMII.  Limited Education about Advanced Directives and Lacks Knowledge of Completion.   Care Plan : LCSW Plan of Care  Updates made by Francis Gaines, LCSW since 02/04/2022 12:00 AM     Problem: Complete Advanced Directives (Living Will and [place]) Documents. Resolved 02/04/2022  Priority: High     Goal: Complete Advanced Directives (Living Will and [place]) Documents. Completed 02/04/2022  Start Date: 02/04/2022  Expected End Date: 02/04/2022  This Visit's Progress: On track  Priority: High  Note:   Current Barriers:   Lack of Advanced Directives (Living Will and [place]) Documents. Lack of knowledge regarding completion of Advanced Directives (Living Will and Healthcare Power of Attorney) Documents. Clinical Goals:  Patient  will work with LCSW to obtain an Garment/textile technologist. Interventions:  Collaboration with Primary Care Physician, Dr. [name], regarding development and update of comprehensive plan of care, as evidenced by provider attestation and co-signature. Inter-disciplinary care team collaboration (see longitudinal plan of care). Assessment of needs, barriers, agencies contacted, as well as how impacting.  Patient interviewed and appropriate assessments performed. A voluntary and extensive discussion about Advanced Directives, including explanation and discussion of Living Will and [place], was undertaken with patient.   Clinical Interventions: PHQ 2 and PHQ 9 Depression Screen Completed and Results  reviewed with patient. Solution-Focused Strategies Implemented. Active Listening/Reflection Utilized. Problem Solving/Task-Centered Solutions Developed. Brief Cognitive Behavioral Therapy Performed. Reviewed Psychotropic Medications, and Emphasized Compliance.   Quality of Sleep Assessed, and Sleep Hygiene Techniques Promoted. Increase in Actives/Exercise Encouraged. Patient Goals/Self-Care Activities:  Complete Advanced Directives (Living Will and Healthcare Power of Attorney) Documents with sister, mailed to your home by LCSW on 02/04/2022. Contact LCSW directly (# [phone]), if you have questions, need assistance, or if additional social work needs are identified in the near future. No Follow-Up Required.     [place] Licensed Clinical Social Worker [facility] [employer] [phone]

## 2022-02-04 NOTE — Patient Instructions (Signed)
Visit Information   Thank you for taking time to visit with me today. Please don't hesitate to contact me if I can be of assistance to you before our next scheduled telephone appointment.  Following are the goals we discussed today:  Patient Goals/Self-Care Activities:  Complete Advanced Directives (Living Will and [place]) Documents with sister, mailed to your home by LCSW on 02/04/2022. Contact LCSW directly (# [phone]), if you have questions, need assistance, or if additional social work needs are identified in the near future. No Follow-Up Required.    Please call the care guide team at [phone] if you need to cancel or reschedule your appointment.   If you are experiencing a Mental Health or [place] or need someone to talk to, please call the Suicide and Crisis Lifeline: 988 call the [place] National Suicide Prevention Lifeline: 956-881-5172 or TTY: 386-367-3019 TTY (705)716-2404) to talk to a trained counselor call 1-800-273-TALK (toll free, 24 hour hotline) go to [facility] [place] [phone]) call the [facility]: [phone] call 911   Following is a copy of your full care plan:  Care Plan : LCSW Plan of Care  Updates made by Francis Gaines, LCSW since 02/04/2022 12:00 AM     Problem: Complete Advanced Directives (Living Will and [place]) Documents. Resolved 02/04/2022  Priority: High     Goal: Complete Advanced Directives (Living Will and [place]) Documents. Completed 02/04/2022  Start Date: 02/04/2022  Expected End Date: 02/04/2022  This Visit's Progress: On track  Priority: High  Note:   Current Barriers:   Lack of Advanced Directives (Living Will and [place]) Documents. Lack of knowledge regarding completion of Advanced Directives (Living Will and Healthcare Power of Attorney)  Documents. Clinical Goals:  Patient will work with LCSW to obtain an Garment/textile technologist. Interventions:  Collaboration with Primary Care Physician, Dr. [name], regarding development and update of comprehensive plan of care, as evidenced by provider attestation and co-signature. Inter-disciplinary care team collaboration (see longitudinal plan of care). Assessment of needs, barriers, agencies contacted, as well as how impacting.  Patient interviewed and appropriate assessments performed. A voluntary and extensive discussion about Advanced Directives, including explanation and discussion of Living Will and [place], was undertaken with patient.   Clinical Interventions: PHQ 2 and PHQ 9 Depression Screen Completed and Results reviewed with patient. Solution-Focused Strategies Implemented. Active Listening/Reflection Utilized. Problem Solving/Task-Centered Solutions Developed. Brief Cognitive Behavioral Therapy Performed. Reviewed Psychotropic Medications, and Emphasized Compliance.   Quality of Sleep Assessed, and Sleep Hygiene Techniques Promoted. Increase in Actives/Exercise Encouraged. Patient Goals/Self-Care Activities:  Complete Advanced Directives (Living Will and Healthcare Power of Attorney) Documents with sister, mailed to your home by LCSW on 02/04/2022. Contact LCSW directly (# [phone]), if you have questions, need assistance, or if additional social work needs are identified in the near future. No Follow-Up Required.       Consent to CCM Services: [name] was given information about Chronic Care Management services including:  CCM service includes personalized support from designated clinical staff supervised by his physician, including individualized plan of care and coordination with other care providers 24/7 contact phone numbers for assistance for urgent and routine care needs. Service will only be billed when office clinical  staff spend 20 minutes or more in a month to coordinate care. Only one practitioner may furnish and bill the service in a  calendar month. The patient may stop CCM services at any time (effective at the end of the month) by phone call to the office staff. The patient will be responsible for cost sharing (co-pay) of up to 20% of the service fee (after annual deductible is met).  Patient agreed to services and verbal consent obtained.   Patient verbalizes understanding of instructions and care plan provided today and agrees to view in [place]. Active MyChart status and patient understanding of how to access instructions and care plan via MyChart confirmed with patient.     [place] Licensed Clinical Social Worker [facility] [employer] [phone]

## 2022-02-05 ENCOUNTER — Encounter: Payer: Self-pay | Admitting: Internal Medicine

## 2022-02-05 ENCOUNTER — Telehealth: Payer: Self-pay | Admitting: Pharmacist

## 2022-02-05 DIAGNOSIS — I739 Peripheral vascular disease, unspecified: Secondary | ICD-10-CM | POA: Diagnosis not present

## 2022-02-05 DIAGNOSIS — Z01810 Encounter for preprocedural cardiovascular examination: Secondary | ICD-10-CM | POA: Diagnosis not present

## 2022-02-05 DIAGNOSIS — G4733 Obstructive sleep apnea (adult) (pediatric): Secondary | ICD-10-CM

## 2022-02-05 NOTE — Telephone Encounter (Signed)
Referral placed to pulmonary

## 2022-02-05 NOTE — Telephone Encounter (Signed)
Patient reported to Chronic Care Management nurse case manager that he did not feel that his CPAP was working properly. Thea Silversmith, RN reached out to [employer]. Patient states that [employer] contact him and he is able to get new CPAP (last CPAP was received 2018). Please assist with either ordering new CPAP from [employer] or refer to pulmonology if needed.

## 2022-02-06 DIAGNOSIS — E785 Hyperlipidemia, unspecified: Secondary | ICD-10-CM

## 2022-02-06 DIAGNOSIS — J449 Chronic obstructive pulmonary disease, unspecified: Secondary | ICD-10-CM

## 2022-02-06 DIAGNOSIS — I10 Essential (primary) hypertension: Secondary | ICD-10-CM

## 2022-02-06 DIAGNOSIS — I5032 Chronic diastolic (congestive) heart failure: Secondary | ICD-10-CM | POA: Diagnosis not present

## 2022-02-06 DIAGNOSIS — E1151 Type 2 diabetes mellitus with diabetic peripheral angiopathy without gangrene: Secondary | ICD-10-CM | POA: Diagnosis not present

## 2022-02-06 LAB — BASIC METABOLIC PANEL
BUN/Creatinine Ratio: 12 (ref 9–20)
BUN: 13 mg/dL (ref 6–24)
CO2: 25 mmol/L (ref 20–29)
Calcium: 9.6 mg/dL (ref 8.7–10.2)
Chloride: 100 mmol/L (ref 96–106)
Creatinine, Ser: 1.09 mg/dL (ref 0.76–1.27)
Glucose: 84 mg/dL (ref 70–99)
Potassium: 4.7 mmol/L (ref 3.5–5.2)
Sodium: 140 mmol/L (ref 134–144)
eGFR: 78 mL/min/{1.73_m2} (ref >59)

## 2022-02-06 LAB — CBC
Hematocrit: 45.2 % (ref 37.5–51.0)
Hemoglobin: 15.4 g/dL (ref 13.0–17.7)
MCH: 31.2 pg (ref 26.6–33.0)
MCHC: 34.1 g/dL (ref 31.5–35.7)
MCV: 92 fL (ref 79–97)
Platelets: 206 10*3/uL (ref 150–450)
RBC: 4.94 x10E6/uL (ref 4.14–5.80)
RDW: 14.9 % (ref 11.6–15.4)
WBC: 7.8 10*3/uL (ref 3.4–10.8)

## 2022-02-09 ENCOUNTER — Telehealth: Payer: Self-pay | Admitting: *Deleted

## 2022-02-09 NOTE — Telephone Encounter (Signed)
Abdominal aortogram scheduled at [facility] for: Wednesday February 11, 2022 12:30 PM Arrival time and place: [place] Entrance A at: 10:30 AM   Nothing to eat after midnight prior to procedure, clear liquids until 5 AM day of procedure.  Medication instructions: -Hold:  Lasix/Spironolactone-AM of procedure  Jardiance-AM of procedure-pt reports he is not taking -Except hold medications usual morning medications can be taken with sips of water including aspirin 81 mg and Plavix 75 mg.  Confirmed patient has responsible adult to drive home post procedure and be with patient first 24 hours after arriving home.  Patient reports no new symptoms concerning for COVID-19 in the past 10 days.  Reviewed procedure instructions with patient.

## 2022-02-11 ENCOUNTER — Encounter (HOSPITAL_COMMUNITY)
Admission: RE | Disposition: A | Discharge status: Home / Self Care | Payer: Medicare HMO | Source: Home / Self Care | Attending: Cardiovascular Disease

## 2022-02-11 ENCOUNTER — Ambulatory Visit (HOSPITAL_COMMUNITY)
Admission: RE | Admit: 2022-02-11 | Discharge: 2022-02-11 | Disposition: A | Discharge status: Home / Self Care | Payer: Medicare HMO | Attending: Cardiovascular Disease | Admitting: Cardiovascular Disease

## 2022-02-11 ENCOUNTER — Other Ambulatory Visit: Payer: Self-pay

## 2022-02-11 DIAGNOSIS — E119 Type 2 diabetes mellitus without complications: Secondary | ICD-10-CM | POA: Diagnosis not present

## 2022-02-11 DIAGNOSIS — Z539 Procedure and treatment not carried out, unspecified reason: Secondary | ICD-10-CM | POA: Diagnosis not present

## 2022-02-11 LAB — GLUCOSE, CAPILLARY: Glucose-Capillary: 127 mg/dL — ABNORMAL HIGH (ref 70–99)

## 2022-02-11 SURGERY — ABDOMINAL AORTOGRAM W/LOWER EXTREMITY
Anesthesia: LOCAL

## 2022-02-11 MED ORDER — FUROSEMIDE 10 MG/ML IJ SOLN
INTRAMUSCULAR | Status: AC
  Filled 2022-02-11: qty 2

## 2022-02-11 MED ORDER — SODIUM CHLORIDE 0.9 % WEIGHT BASED INFUSION: 1.0000 mL/kg/h | INTRAVENOUS | Status: DC

## 2022-02-11 MED ORDER — ASPIRIN 81 MG PO CHEW
81.0000 mg | CHEWABLE_TABLET | ORAL | Status: AC
  Administered 2022-02-11: 81 mg via ORAL
  Filled 2022-02-11: qty 1

## 2022-02-11 MED ORDER — FUROSEMIDE 10 MG/ML IJ SOLN
20.0000 mg | Freq: Once | INTRAMUSCULAR | Status: AC
  Administered 2022-02-11: 20 mg via INTRAVENOUS

## 2022-02-11 MED ORDER — SODIUM CHLORIDE 0.9 % IV SOLN: 250.0000 mL | INTRAVENOUS | Status: DC | PRN

## 2022-02-11 MED ORDER — SODIUM CHLORIDE 0.9% FLUSH: 3.0000 mL | INTRAVENOUS | Status: DC | PRN

## 2022-02-11 MED ORDER — SODIUM CHLORIDE 0.9% FLUSH: 3.0000 mL | Freq: Two times a day (BID) | INTRAVENOUS | Status: DC

## 2022-02-11 MED ORDER — SODIUM CHLORIDE 0.9 % WEIGHT BASED INFUSION: 3.0000 mL/kg/h | INTRAVENOUS | Status: DC

## 2022-02-11 MED ORDER — SODIUM CHLORIDE 0.9 % IV SOLN: INTRAVENOUS | Status: DC

## 2022-02-11 NOTE — Progress Notes (Signed)
Pt start time delayed due to extended case before him, pt requests to be rescheduled.

## 2022-02-11 NOTE — Progress Notes (Signed)
Eliezer Champagne RN notified of pt satslow 90s, congested cough, DOE and IVF orders-advised to keep IVF at [facility] and she will see pt

## 2022-02-12 ENCOUNTER — Telehealth: Payer: Self-pay | Admitting: Cardiovascular Disease

## 2022-02-12 NOTE — Telephone Encounter (Signed)
Patient is calling in to get is procedure reschedule. Please advise

## 2022-02-12 NOTE — H&P (Signed)
The patient's procedure was rescheduled due to dealing with an emergency.

## 2022-02-12 NOTE — Telephone Encounter (Signed)
Returned the call to the patient.  The procedure has been rescheduled for 7/12 at 10:30 with arrival at 8:30. Labs have been completed. The patient has been advised to follow the same instructions that were sent via [place].

## 2022-02-12 NOTE — Telephone Encounter (Signed)
Patient's sister called to reschedule patient's procedure.

## 2022-02-13 ENCOUNTER — Encounter (HOSPITAL_COMMUNITY): Payer: Medicare HMO

## 2022-02-13 ENCOUNTER — Encounter (HOSPITAL_COMMUNITY): Payer: Self-pay

## 2022-02-16 ENCOUNTER — Telehealth: Payer: Self-pay | Admitting: Pharmacist

## 2022-02-16 ENCOUNTER — Telehealth: Payer: Self-pay | Admitting: *Deleted

## 2022-02-16 ENCOUNTER — Ambulatory Visit (INDEPENDENT_AMBULATORY_CARE_PROVIDER_SITE_OTHER): Payer: Medicare HMO | Admitting: Pharmacist

## 2022-02-16 DIAGNOSIS — G4733 Obstructive sleep apnea (adult) (pediatric): Secondary | ICD-10-CM

## 2022-02-16 DIAGNOSIS — F17200 Nicotine dependence, unspecified, uncomplicated: Secondary | ICD-10-CM

## 2022-02-16 DIAGNOSIS — I5032 Chronic diastolic (congestive) heart failure: Secondary | ICD-10-CM

## 2022-02-16 DIAGNOSIS — J449 Chronic obstructive pulmonary disease, unspecified: Secondary | ICD-10-CM

## 2022-02-16 DIAGNOSIS — I739 Peripheral vascular disease, unspecified: Secondary | ICD-10-CM

## 2022-02-16 DIAGNOSIS — I10 Essential (primary) hypertension: Secondary | ICD-10-CM

## 2022-02-16 MED ORDER — EMPAGLIFLOZIN 10 MG PO TABS: 10.0000 mg | ORAL_TABLET | Freq: Every day | ORAL | 0 refills | Status: DC

## 2022-02-16 MED ORDER — NICOTINE 21 MG/24HR TD PT24: 21.0000 mg | MEDICATED_PATCH | Freq: Every day | TRANSDERMAL | 0 refills | Status: DC

## 2022-02-16 NOTE — Telephone Encounter (Signed)
Patient called back and phone visit was completed.

## 2022-02-16 NOTE — Telephone Encounter (Signed)
  Care Management   Follow Up Note   02/16/2022 Name: [name]. MRN: [id number] DOB: 04-Nov-1960   Referred by: Colon Branch, MD Reason for referral : No chief complaint on file.   An unsuccessful telephone outreach was attempted today. The patient was referred to the case management team for assistance with care management and care coordination.   Follow Up Plan: The care management team will reach out to the patient again over the next 30 days.   Cherre Robins, PharmD Clinical Pharmacist [facility]

## 2022-02-16 NOTE — Telephone Encounter (Signed)
Abdominal aortogram scheduled at [facility] for: Wednesday February 18, 2022 10:30 AM Arrival time and place: [place] Entrance A at: 8:30 AM   Nothing to eat after midnight prior to procedure, clear liquids until 5 AM day of procedure.  Medication instructions: -Hold:  Lasix/Spironolactone-AM of procedure  Januvia-AM of procedure -Except hold medications usual morning medications can be taken with sips of water including aspirin 81 mg and Plavix 75 mg  Confirmed patient has responsible adult to drive home post procedure and be with patient first 24 hours after arriving home.  Patient reports no new symptoms concerning for COVID-19 in the past 10 days.  Reviewed procedure instructions with patient.

## 2022-02-16 NOTE — Chronic Care Management (AMB) (Signed)
//   Chronic Care Management Pharmacy Note  02/16/2022 Name:  [name]. MRN:  [id number] DOB:  01/01/1961  Summary:   Leg Pain: procedure to place stent rescheduled for February 18, 2022 CHF: patient was tolerating addition of Jardiance 75m daily that was added after hospitalization in mid May 2023 for CHF exacerbation, however after 30 days he thought he was suppose to stop. He has not taken in 2 weeks. Recommended he restart Jardiance. [facility] (02/05/2022) COPD / tobacco abuse: [name] ready to stop smoking. He has about 8 of the 261mnicotine patches at home but needs updated Rx. Prescription sent in. After 30 days he will decrease to 1439mose of nicotine patches which he already has 30 patches of. He has also restarted bupropion 150m64mfirst dose of yesterday. He will take bupropion 1 tablet daily for 3 days, then incresae to take 1 tablet 2 times a day thereafter.  Patient asked about CPAP. Referral to pulmonary for reassessment of OSA was sent by PCP 02/05/2022. Patient is to notify our referral department if he has not heard about an appointment by 02/23/2022.   Follow up: Patient has phone follow up with nurse care manager in 2 week. Clinical Pharmacist Practitioner will follow up by phone in 4 weeks.   Subjective: [name] an 61 y51. year old male who is a primary patient of [name], JoseAlda [name].  The CCM team was consulted for assistance with disease management and care coordination needs.    Collaboration with patient by phone   for follow up visit in response to provider referral for pharmacy case management and/or care coordination services.   Consent to Services:  The patient was given information about Chronic Care Management services, agreed to services, and gave verbal consent prior to initiation of services.  Please see initial visit note for detailed documentation.   Patient Care Team: KennethKenneth [name] as  PCP - General (Internal Medicine) McAlBurnell Blanks as PCP - Cardiology (Cardiology) HollRalene Bathe as Consulting Physician (Ophthalmology) Pyrtle, Jay Lajuan Lines as Consulting Physician (Gastroenterology) BeanSusa Day as Consulting Physician (Orthopedic Surgery) WallLuretha Rued as Case Manager EckaCherre RobinsH-CPP (Pharmacist) NewmRozetta Nunnery (Inactive) as Consulting Physician (Otolaryngology)  Recent PCP office visits:  03/39/2023 - PCP (Dr [name])Larose Kellsllow up. Labs checked. No med changes.  Recent specialist consults:  02/11/2022 - was suppose to have procedure but was rescheduled due to dealing with an emergency. Rescheduled for 02/18/2022 at 10:30am (arrival at 8:30a) 01/13/2022 - Cardio (Dr AridFletcher AnonU PAD and claudication. Ordered ABI and lower ext arterial duplex. F/U 6 months. no med changes noted.  Recent [facility]:  [facility]  Medications started at discharge: Jardiance 10mg23mly  Medications stopped at discharge: none Medications changed at discharge: decreased valsartan dose to 40mg 68my and changed furosemide to 20mg -85me 3 tablets = 60mg on51m day.  Objective:  Lab Results  Component Value Date   CREATININE 1.09 02/05/2022   CREATININE 1.39 (H) 12/26/2021   CREATININE 1.41 (H) 12/25/2021    Lab Results  Component Value Date   HGBA1C 6.8 (H) 11/05/2021   Last diabetic Eye exam:  Lab Results  Component Value Date/Time   HMDIABEYEEXA No Retinopathy 06/30/2021 12:00 AM    Last diabetic Foot exam: No results found for: "HMDIABFOOTEX"  Component Value Date/Time   CHOL 141 05/07/2021 1347   CHOL 171 11/14/2019 1441   TRIG 142.0 05/07/2021 1347   HDL 41.70 05/07/2021 1347   HDL 37 (L) 11/14/2019 1441   CHOLHDL 3 05/07/2021 1347   VLDL 28.4 05/07/2021 1347   LDLCALC 71 05/07/2021 1347   LDLCALC 116 (H) 11/14/2019 1441   LDLDIRECT 116.0 05/12/2013 1517        Latest Ref Rng & Units 04/11/2021   10:56 AM 07/10/2020    2:45 PM 11/26/2018   12:22 PM  Hepatic Function  Total Protein 6.5 - 8.1 g/dL 7.9  7.1  7.8   Albumin 3.5 - 5.0 g/dL 4.3  4.0  3.9   AST 15 - 41 U/L '18  12  22   ' ALT 0 - 44 U/L '15  13  19   ' Alk Phosphatase 38 - 126 U/L 66  135  88   Total Bilirubin 0.3 - 1.2 mg/dL 1.5  1.2  2.1     Lab Results  Component Value Date/Time   TSH 1.895 07/07/2016 06:31 PM   TSH 1.88 05/05/2013 12:14 PM   TSH 2.290 **Test methodology is 3rd generation TSH** 06/23/2009 07:55 AM       Latest Ref Rng & Units 02/05/2022    2:47 PM 12/24/2021    2:04 AM 12/23/2021   12:49 PM  CBC  WBC 3.4 - 10.8 x10E3/uL 7.8  9.0  7.1   Hemoglobin 13.0 - 17.7 g/dL 15.4  14.8  15.0   Hematocrit 37.5 - 51.0 % 45.2  45.2  46.3   Platelets 150 - 450 x10E3/uL 206  195  216     Lab Results  Component Value Date/Time   VD25OH 39.28 11/05/2021 04:04 PM   VD25OH 10.8 (L) 11/03/2016 02:53 PM    Clinical ASCVD: Yes  The ASCVD Risk score (Arnett DK, et al., 2019) failed to calculate for the following reasons:   The patient has a prior MI or stroke diagnosis     Social History   Tobacco Use  Smoking Status Every Day   Packs/day: 0.50   Years: 45.00   Total pack years: 22.50   Types: Cigarettes   Start date: 1976   Passive exposure: Current  Smokeless Tobacco Never  Tobacco Comments   1 pack daily- wants to quit   BP Readings from Last 3 Encounters:  02/18/22 121/69  02/11/22 123/73  01/13/22 132/80   Pulse Readings from Last 3 Encounters:  02/18/22 68  02/11/22 65  01/13/22 73   Wt Readings from Last 3 Encounters:  02/18/22 235 lb (106.6 kg)  02/11/22 235 lb (106.6 kg)  01/13/22 234 lb (106.1 kg)    Assessment: Review of patient past medical history, allergies, medications, health status, including review of consultants reports, laboratory and other test data, was performed as part of comprehensive evaluation and provision of chronic care  management services.   SDOH:  (Social Determinants of Health) assessments and interventions performed:        CCM Care Plan  Allergies  Allergen Reactions   Metformin And Related Diarrhea and Other (See Comments)    Severe diarrhea   Wellbutrin [Bupropion] Nausea Only    Medications Reviewed Today     Reviewed by Wellington Hampshire, MD (Physician) on 02/18/22 at 1013  Med List Status: Complete   Medication Order Taking? Sig Documenting Provider Last Dose Status Informant  aspirin EC 81 MG EC tablet [id number] Yes Take 1 tablet (81 mg  total) by mouth daily with breakfast. Roxan Hockey, MD 02/18/2022 0600 Active Multiple Informants  atorvastatin (LIPITOR) 80 MG tablet [id number] Yes TAKE 1 TABLET EVERY DAY AT 6:00PM  Patient taking differently: Take 80 mg by mouth at bedtime.   Burnell Blanks, MD 02/17/2022 Active Multiple Informants  augmented betamethasone dipropionate (DIPROLENE-AF) 0.05 % cream [id number] Yes APPLY TOPICALLY TWICE DAILY Kenneth Branch, MD 02/17/2022 Active Multiple Informants  Blood Glucose Monitoring Suppl (ONETOUCH VERIO REFLECT) w/Device KIT [id number]  Use to check blood glucose daily (DX: type 2 DM - E 11.51) Kenneth Branch, MD  Active Multiple Informants  budesonide-formoterol [facility]) 160-4.5 MCG/ACT inhaler [id number] Yes Inhale 2 puffs into the lungs 2 (two) times daily.  Patient taking differently: Inhale 1-2 puffs into the lungs See admin instructions. Inhale 1 puff into the lungs in the morning and 2 puffs in the evening   Kenneth Branch, MD 02/17/2022 Active Multiple Informants  Cholecalciferol (VITAMIN D-3) 125 MCG (5000 UT) TABS [id number] Yes Take 5,000 Units by mouth daily. [provider] 02/17/2022 Active Multiple Informants  clopidogrel (PLAVIX) 75 MG tablet [id number] Yes TAKE 1 TABLET EVERY DAY Burnell Blanks, MD 02/18/2022 0600 Active Multiple Informants  Cyanocobalamin (VITAMIN B-12) 5000 MCG SUBL [id number] Yes Place 5,000 mcg  under the tongue daily. [provider] 02/17/2022 Active Multiple Informants  diclofenac Sodium (VOLTAREN) 1 % GEL [id number] No Apply 1 application  topically daily as needed for pain. [provider] Unknown Active Multiple Informants  empagliflozin (JARDIANCE) 10 MG TABS tablet [id number] Yes Take 1 tablet (10 mg total) by mouth daily. Kenneth Branch, MD 02/17/2022 Active   ezetimibe (ZETIA) 10 MG tablet [id number] Yes Take 1 tablet (10 mg total) by mouth daily.  Patient taking differently: Take 10 mg by mouth every evening.   Burnell Blanks, MD 02/17/2022 Active Multiple Informants  fenofibrate (TRICOR) 145 MG tablet [id number] Yes TAKE 1 TABLET EVERY DAY Kenneth Branch, MD 02/17/2022 Active Multiple Informants  folic acid (FOLVITE) 1 MG tablet [id number] Yes Take 1 tablet (1 mg total) by mouth daily. Roxan Hockey, MD 02/17/2022 Active Multiple Informants  furosemide (LASIX) 20 MG tablet [id number] Yes Take 3 tablets (60 mg total) by mouth daily. Arrien, Jimmy Picket, MD 02/17/2022 Active Multiple Informants  gabapentin (NEURONTIN) 100 MG capsule [id number] No Take 2 capsules (200 mg total) by mouth 3 (three) times daily. Kenneth Branch, MD Unknown Active Multiple Informants           Med Note Dessie Coma   Tue Dec 23, 2021  8:41 PM)    glucose blood [facility] VERIO) test strip [id number]  Use to check blood glucose once a day (DX: 11.51 - type 2 DM) Kenneth Branch, MD  Active Multiple Informants  isosorbide mononitrate (IMDUR) 60 MG 24 hr tablet [id number] Yes TAKE 1 AND 1/2 TABLETS EVERY DAY Burnell Blanks, MD 02/17/2022 Active Multiple Informants  nicotine (NICOTINE STEP 1) 21 mg/24hr patch [id number] Yes Place 1 patch (21 mg total) onto the skin daily. Kenneth Branch, MD 02/18/2022 Active   nitroGLYCERIN (NITROSTAT) 0.4 MG SL tablet [id number] No Place 1 tablet (0.4 mg total) under the tongue every 5 (five) minutes x 3 doses as needed for chest pain. Kenneth Branch, MD Unknown Active  Multiple Informants           Med Note ([place], [place] Dec 23, 2021  2:21 PM)    OneTouch Delica Lancets 84T MISC  [id number]  Use to check blood glucose once a day (DX: E11.51 type 2 DM) Kenneth Branch, MD  Active Multiple Informants  OXYGEN [id number] Yes Inhale 2-4 L/min into the lungs as needed (for shortness of breath). [provider] 02/18/2022 Active Multiple Informants  pantoprazole (PROTONIX) 40 MG tablet [id number] Yes TAKE 1 TABLET TWICE DAILY Burnell Blanks, MD 02/17/2022 Active Multiple Informants  PRESCRIPTION MEDICATION [id number] No CPAP- At bedtime [provider] Unknown Active Multiple Informants  spironolactone (ALDACTONE) 25 MG tablet [id number] Yes TAKE 1/2 TABLET EVERY DAY Burnell Blanks, MD 02/17/2022 Active Multiple Informants  tamsulosin (FLOMAX) 0.4 MG CAPS capsule [id number] Yes Take 1 capsule (0.4 mg total) by mouth in the morning and at bedtime. Kenneth Branch, MD 02/17/2022 Active Multiple Informants  thiamine 100 MG tablet [id number] Yes Take 1 tablet (100 mg total) by mouth daily. Roxan Hockey, MD 02/17/2022 Active Multiple Informants  traZODone (DESYREL) 100 MG tablet [id number] Yes Take 1 tablet (100 mg total) by mouth at bedtime. Kenneth Branch, MD 02/17/2022 Active Multiple Informants  valsartan (DIOVAN) 40 MG tablet [id number] Yes Take 1 tablet (40 mg total) by mouth daily. Kenneth Branch, MD 02/17/2022 Active             Patient Active Problem List   Diagnosis Date Noted   Class 1 obesity 12/25/2021   AKI (acute kidney injury) ([place]) 12/25/2021   Acute on chronic diastolic heart failure ([place]) 12/23/2021   Chronic respiratory failure with hypoxia ([place]) 12/23/2021   COPD exacerbation ([place]) 12/23/2021   Alcohol use 12/23/2021   (HFpEF) heart failure with preserved ejection fraction (HCC)    DOE (dyspnea on exertion) 11/07/2020   Type 2 diabetes mellitus with hyperlipidemia ([place]) 03/18/2020   Prolapsed lumbar disc 01/25/2020    Hiatal hernia    Hepatic steatosis    Hemianopia, homonymous, right    ETOH abuse    Esophagitis    Chronic diastolic CHF (congestive heart failure) (HCC)    Chronic bronchitis (HCC)    CAD (coronary artery disease)    PAD (peripheral artery disease) ([place]) 09/23/2016   Morbid obesity ([place])    Chronic pain syndrome 10/23/2015   GERD (gastroesophageal reflux disease) 08/07/2015   PCP NOTES >>>>> 06/01/2015   Acute ischemic stroke ([place]) 02/20/2015   BPH (benign prostatic hyperplasia) 02/20/2015   OSA (obstructive sleep apnea) ?? 05/05/2013   Erectile dysfunction 03/17/2012   Emphysema (subcutaneous) (surgical) resulting from a procedure 10/08/2008   Iron deficiency anemia 08/10/2008   ABNORMALITY OF GAIT 08/06/2008   Cigarette smoker 07/27/2008   Essential hypertension 07/26/2008   ST elevation myocardial infarction involving left circumflex coronary artery ([place]) 07/26/2008    Immunization History  Administered Date(s) Administered   PFIZER(Purple Top)SARS-COV-2 Vaccination 11/17/2019, 12/11/2019, 07/18/2020   Pneumococcal Polysaccharide-23 11/03/2016   Tdap 11/03/2016    Conditions to be addressed/monitored: CHF, CAD, HTN, HLD, Hypertriglyceridemia, COPD, DMII, and BPH; OSA; GERD; fatty liver; h/o strokePAD; sleep disturbance; anxiety/ depression  Care Plan : Pharmacy Care Plan  Updates made by Cherre Robins, RPH-CPP since 03/01/2022 12:00 AM     Problem: HTN; CHF: CAD; GERD with esophagitis; depression; BPH; COPD; bronchitis; stroke; elevated TG and hyperlipidemia; type 2 DM with h/o pancreatitis   Priority: High  Onset Date: 03/18/2021     Long-Range Goal: Management of chronic conditions and medications   Start Date: 03/18/2021  Recent Progress: On track  Priority: High  Note:   Current Barriers:  Unable to independently monitor therapeutic  efficacy Unable to achieve control of Hyperlipidemia  Unable to maintain control of HTN, CHF Does not adhere to prescribed  medication regimen Does not contact provider office for questions/concerns  Pharmacist Clinical Goal(s):  Over the next 90 days, patient will verbalize ability to afford treatment regimen achieve adherence to monitoring guidelines and medication adherence to achieve therapeutic efficacy achieve control of Hyperlipidemia as evidenced by LDL <70 maintain control of CHF, HTN and type 2 DM as evidenced by attaining goals listed below  adhere to prescribed medication regimen as evidenced by refill history contact provider office for questions/concerns as evidenced notation of same in electronic health record through collaboration with PharmD and provider.   Interventions: 1:1 collaboration with Kenneth Branch, MD regarding development and update of comprehensive plan of care as evidenced by provider attestation and co-signature Inter-disciplinary care team collaboration (see longitudinal plan of care) Comprehensive medication review performed; medication list updated in electronic medical record  Diabetes: Not at goal;  Goal A1c < 6.5% Lab Results  Component Value Date   HGBA1C 6.8 (H) 11/05/2021  Current treatment:   Jardiance 79m daily  Tried metformin in past but stopped due to GI side effects (tried 5080mday and 25028may) History of pancreatitis (possibly related to alcohol use)   Current glucose readings: does not currently check BG at home Denies hypoglycemic/hyperglycemic symptoms Current meal patterns: over the last 2 to 3 weeks has been limiting bread and sweets Current exercise: none currently Interventions:  Counseled on A1c and BG goals ,  Sent in Rx for Jardiance 25m57mily to local pharmacy. Discussed benefits of Jardiance for blood glucose and heart.  Continue to limit serving sizes and intake of breads and sweets Check blood glucose daily to every other day:  Reviewed home blood glucose goals  Fasting blood glucose goal (before meals) = 80 to 130 Blood glucose goal  after a meal = less than 180    Hypertension / CHF: Improving; goal: decrease symptoms of CHF, prevent exacerbations and BP <140/90.  Current treatment: Furosemide 20mg4make 3 tablets = 60mg 39m morning Spironolactone 25mg -44me 0.5 tablet = 12.5mg eac44morning Valsartan 40mg dai33mardiance 25mg dail78mas scales but has not been checking weight at home. EF = 55-60% (12/24/2021) Past medications tried: lisinopril 25mg - cha92mto valsartan by pulmonologist; metoprolol - stopped due to low BP Denies hypotensive/hypertensive symptoms Recent home blood pressure: 134/64; 123/71  02/02/2022 was slightly elevated at 147/69 HR 60 - home blood pressure from visit with Juana WallaThea Silversmithe Case Manager.  Interventions:  Reviewed blood pressure goals Reminded patient to rest / sit for at least 5 minutes prior to checking blood pressure.  Recommended continue lower dose of valsartan 40mg daily 33mchanges during hospitalization. [facility] signs and symptoms of CHF exacerbation - weight gain, SOB, abdominal fullness, swelling in legs or abdomen, Fatigue and weakness, changes in ability to perform usual activities, persistent cough or wheezing with white or pink blood-tinged mucus, nausea and lack of appetite Restart weighing daily - report weight gain of more than 3 lbs in 24 hours or 5 lbs in 1 week.   Hyperlipidemia / CAD / history of stroke / PAD: Uncontrolled but improving; LDL goal <55 (per cardio) ; Tg goal <150 Last LDL decreased from 116 to 71 (05/07/2021)  Current treatment: Atorvastatin 80mg daily E43mmibe 25mg daily  F65mibrate 145mg daily Asp73m 81mg daily with59makfast  Clopidogrel  74m daily Isosorbide ER 644m0 take 1.5 tablets = 904maily Nitroglycerin 0.4mg66mplace 1 tablet under the tongue and allow to dissolve as needed for chest pain Medications previously tried: Repatha - stopped due to  cost; Brilenta  - stopped due to shortness of breath Patient reports he has restarted aspirin. He has not had a nosebleed in the last 2 weeks Leg Pain: He states that he is having more difficulty walking. He is not sure if this due to neuropathy but patient is concerned that his right legs has a blockage. 09/23/2016 patient had occluded left SFA and Dr AridFletcher Anonformed a directional atherectomy of the left SFA followed by drug-coated balloon angioplasty and spot stenting in the midsegment. Dr AridFletcher Anono noted (1) No significant aortoiliac disease other than occluded internal iliac arteries.(2) Significant proximal right SFA disease with three-vessel runoff below the knee. Right SFA endovascular intervention can be performed if the patient reports good response on the left side but patient may not have followed up with Dr AridFletcher Anontient saw Dr AridFletcher Anon06/2023- ABI and ultrasound ordered for 01/27/2022. He has stent placement scheduled for 02/18/2022 Interventions:  Continue aspirin 81mg36mly.  Follow up as planned with Dr AridaFletcher Anonstent Continue current regimen for lipid lowering. Recheck lipids in next 4 to 6 weeks.   Chronic Obstructive Pulmonary Disease / OSA: Controlled Current treatment: CPAP Symbicort (budesonide/formoterol) 160/4.5 - inhale 2 puffs into lungs twice a day Managed by pulmonologist - Dr Wert Melvyn Novass  03/31/2021  FEV1 1.93 (53 % ) ratio 0.70  p 13 % improvement from saba p 0 prior to study with   FV curve no signficant curvature Previous medications tried: Spiriva - not effective Interventions: (addressed at previous visit) Continue to follow up with pulmonologist  Discussed that Symbicort was for healing his lungs / for maintenance.  Reminded patient to rinse mouth after using Symbicort.   Tobacco Use disorder:  Patient has restarted smoking after quitting earlier this year. He now would like to try to stop smoking again. He has bupropion at home.  Reports smoking 1 to 2  ppd Past therapies tried: nicotine patches (not effective) ; Chantix (not helpful); Cold tur[place]Ku[place]apsed; bupropion tried after last visit - caused nausea  Interventions:  Encouraged patient to decreased number of cigarettes smoked per day.  Start bupropion ER 150mg 61m daily for 3 days, then increase to take 150mg t57m a day thereafter.  Could consider nortriptyline if needed in future as there has been some success for smoking cessation with nortriptyline. Could also help with sleep.   BPH / Urinary incontinence:  had episode of incontinence in a store recently.  He last saw urologist 08/2021. At that time patient reported that tamsulosin 0.4mg bid51ms working well. Patient is convinced that furosemide is cause of incontinence and while it can worsen urinary frequency, he is on a low dose of furosemide 20mg dai35mtakes 40mg if n22md). Current therapy:  Tamsulosin 0.4mg twice 82may Interventions: (addressed at previous visit)   Recommended he discuss furosemide with cardiologist, Dr McAlhany anAngelena Formy incontinence with urologist, Dr Newsome.   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 Sometimes has difficulty reading labels on his prescription bottles.  His sister, Christy is Alyse Low patient with medications as she is able (she does live in  [place] so she is not able to see him frequently)   Interventions:   Reviewed refill history; Discussed restarting Jardiance 43m daily - Rx sent to local pharmacy - [facility].    Patient Goals/Self-Care Activities Over the next 90 days, patient will:  take medications as prescribed,  Restart Jardiance 145m- take 1 tablet daily Restart bupropion to help with smoking cravings - start with 15078mach morning for first 3 days, then  increase to 150m28mice a day thereafter.  focus on medication adherence by utilizing mail order pharmacy and contacting clinical pharmacist (TamLynelle [phone] any medications questions or concerns,  check blood pressure 2 to 3 times per week, document, and provide at future appointments,  weigh daily, and contact provider if weight gain of more than 3 lbs in 24 hours or 5 lbs in 1 week  Continue valsartan at lower dose of 40mg24mly.   Follow Up Plan: Telephone follow up appointment with care management team member scheduled for:  2 to 4 weeks.             Medication Assistance:  none at this time but might need assistance if additional lipid lowering therapy needed in future.   Applied for LIS / extra help in 2023.  Patient's preferred pharmacy is:  [facility] [place]: [phone] [address] [place]: [phone]- [place]e: [phone] pill box? No - patient has declined to use pill container but he is starting to use stickers on bottles to help identify time of day to take each medication  Follow Up:  Patient agrees to Care Plan and Follow-up.  Plan: follow up phone call with clinical pharmacist 2 to 4 weeks  TammyCherre RobinsrmD Clinical Pharmacist [facility]

## 2022-02-18 ENCOUNTER — Encounter (HOSPITAL_COMMUNITY): Payer: Self-pay | Admitting: Cardiovascular Disease

## 2022-02-18 ENCOUNTER — Ambulatory Visit (HOSPITAL_COMMUNITY)
Admission: RE | Disposition: A | Discharge status: Home / Self Care | Payer: Medicare HMO | Source: Home / Self Care | Attending: Cardiovascular Disease

## 2022-02-18 ENCOUNTER — Ambulatory Visit (HOSPITAL_COMMUNITY)
Admission: RE | Admit: 2022-02-18 | Discharge: 2022-02-18 | Disposition: A | Discharge status: Home / Self Care | Payer: Medicare HMO | Attending: Cardiovascular Disease | Admitting: Cardiovascular Disease

## 2022-02-18 ENCOUNTER — Other Ambulatory Visit: Payer: Self-pay

## 2022-02-18 DIAGNOSIS — F1290 Cannabis use, unspecified, uncomplicated: Secondary | ICD-10-CM | POA: Diagnosis not present

## 2022-02-18 DIAGNOSIS — I110 Hypertensive heart disease with heart failure: Secondary | ICD-10-CM | POA: Insufficient documentation

## 2022-02-18 DIAGNOSIS — I70211 Atherosclerosis of native arteries of extremities with intermittent claudication, right leg: Secondary | ICD-10-CM | POA: Diagnosis not present

## 2022-02-18 DIAGNOSIS — I252 Old myocardial infarction: Secondary | ICD-10-CM | POA: Insufficient documentation

## 2022-02-18 DIAGNOSIS — J449 Chronic obstructive pulmonary disease, unspecified: Secondary | ICD-10-CM | POA: Insufficient documentation

## 2022-02-18 DIAGNOSIS — I2510 Atherosclerotic heart disease of native coronary artery without angina pectoris: Secondary | ICD-10-CM | POA: Diagnosis not present

## 2022-02-18 DIAGNOSIS — F17210 Nicotine dependence, cigarettes, uncomplicated: Secondary | ICD-10-CM | POA: Diagnosis not present

## 2022-02-18 DIAGNOSIS — I5033 Acute on chronic diastolic (congestive) heart failure: Secondary | ICD-10-CM | POA: Diagnosis not present

## 2022-02-18 DIAGNOSIS — Z955 Presence of coronary angioplasty implant and graft: Secondary | ICD-10-CM | POA: Insufficient documentation

## 2022-02-18 DIAGNOSIS — Z951 Presence of aortocoronary bypass graft: Secondary | ICD-10-CM | POA: Diagnosis not present

## 2022-02-18 DIAGNOSIS — E782 Mixed hyperlipidemia: Secondary | ICD-10-CM | POA: Diagnosis not present

## 2022-02-18 DIAGNOSIS — Z79899 Other long term (current) drug therapy: Secondary | ICD-10-CM | POA: Diagnosis not present

## 2022-02-18 HISTORY — PX: PERIPHERAL VASCULAR ATHERECTOMY: CATH118256

## 2022-02-18 HISTORY — PX: ABDOMINAL AORTOGRAM W/LOWER EXTREMITY: CATH118223

## 2022-02-18 LAB — POCT ACTIVATED CLOTTING TIME: Activated Clotting Time: 269 seconds

## 2022-02-18 SURGERY — ABDOMINAL AORTOGRAM W/LOWER EXTREMITY
Anesthesia: LOCAL

## 2022-02-18 MED ORDER — SODIUM CHLORIDE 0.9 % IV SOLN: INTRAVENOUS | Status: DC

## 2022-02-18 MED ORDER — SODIUM CHLORIDE 0.9% FLUSH: 3.0000 mL | Freq: Two times a day (BID) | INTRAVENOUS | Status: DC

## 2022-02-18 MED ORDER — SODIUM CHLORIDE 0.9 % IV SOLN: 250.0000 mL | INTRAVENOUS | Status: DC | PRN

## 2022-02-18 MED ORDER — ONDANSETRON HCL 4 MG/2ML IJ SOLN: 4.0000 mg | Freq: Four times a day (QID) | INTRAMUSCULAR | Status: DC | PRN

## 2022-02-18 MED ORDER — SODIUM CHLORIDE 0.9% FLUSH: 3.0000 mL | INTRAVENOUS | Status: DC | PRN

## 2022-02-18 MED ORDER — MIDAZOLAM HCL 2 MG/2ML IJ SOLN
INTRAMUSCULAR | Status: AC
  Filled 2022-02-18: qty 2

## 2022-02-18 MED ORDER — IODIXANOL 320 MG/ML IV SOLN
INTRAVENOUS | Status: DC | PRN
  Administered 2022-02-18: 180 mL via INTRA_ARTERIAL

## 2022-02-18 MED ORDER — FENTANYL CITRATE (PF) 100 MCG/2ML IJ SOLN
INTRAMUSCULAR | Status: DC | PRN
  Administered 2022-02-18: 50 ug via INTRAVENOUS

## 2022-02-18 MED ORDER — FENTANYL CITRATE (PF) 100 MCG/2ML IJ SOLN
INTRAMUSCULAR | Status: AC
  Filled 2022-02-18: qty 2

## 2022-02-18 MED ORDER — HEPARIN SODIUM (PORCINE) 1000 UNIT/ML IJ SOLN
INTRAMUSCULAR | Status: AC
  Filled 2022-02-18: qty 10

## 2022-02-18 MED ORDER — LIDOCAINE HCL (PF) 1 % IJ SOLN
INTRAMUSCULAR | Status: AC
  Filled 2022-02-18: qty 30

## 2022-02-18 MED ORDER — HEPARIN (PORCINE) IN NACL 1000-0.9 UT/500ML-% IV SOLN
INTRAVENOUS | Status: AC
  Filled 2022-02-18: qty 1000

## 2022-02-18 MED ORDER — ASPIRIN 81 MG PO CHEW: 81.0000 mg | CHEWABLE_TABLET | ORAL | Status: DC

## 2022-02-18 MED ORDER — CLOPIDOGREL BISULFATE 75 MG PO TABS: 75.0000 mg | ORAL_TABLET | ORAL | Status: DC

## 2022-02-18 MED ORDER — HEPARIN SODIUM (PORCINE) 1000 UNIT/ML IJ SOLN
INTRAMUSCULAR | Status: DC | PRN
  Administered 2022-02-18: 10000 [IU] via INTRAVENOUS

## 2022-02-18 MED ORDER — ACETAMINOPHEN 325 MG PO TABS: 650.0000 mg | ORAL_TABLET | ORAL | Status: DC | PRN

## 2022-02-18 MED ORDER — LIDOCAINE HCL (PF) 1 % IJ SOLN
INTRAMUSCULAR | Status: DC | PRN
  Administered 2022-02-18: 30 mL

## 2022-02-18 MED ORDER — MIDAZOLAM HCL 2 MG/2ML IJ SOLN
INTRAMUSCULAR | Status: DC | PRN
  Administered 2022-02-18: 1 mg via INTRAVENOUS

## 2022-02-18 SURGICAL SUPPLY — 25 items
BALLN IN.PACT DCB 5X150 (BALLOONS) ×3
CATH ANGIO 5F PIGTAIL 65CM (CATHETERS) ×1 IMPLANT
CATH CROSS OVER TEMPO 5F (CATHETERS) ×1 IMPLANT
CATH HAWKONE LX EXTENDED TIP (CATHETERS) ×1 IMPLANT
CATH QUICKCROSS .035X135CM (MICROCATHETER) ×1 IMPLANT
CATH STRAIGHT 5FR 65CM (CATHETERS) ×1 IMPLANT
DCB IN.PACT 5X150 (BALLOONS) IMPLANT
DEVICE CLOSURE MYNXGRIP 6/7F (Vascular Products) ×1 IMPLANT
DEVICE SPIDERFX EMB PROT 6MM (WIRE) ×1 IMPLANT
GLIDEWIRE ADV .035X260CM (WIRE) ×1 IMPLANT
KIT ENCORE 26 ADVANTAGE (KITS) ×1 IMPLANT
KIT MICROPUNCTURE NIT STIFF (SHEATH) ×1 IMPLANT
KIT PV (KITS) ×3 IMPLANT
SHEATH PINNACLE 5F 10CM (SHEATH) ×1 IMPLANT
SHEATH PINNACLE 7F 10CM (SHEATH) ×1 IMPLANT
SHEATH PINNACLE ST 7F 45CM (SHEATH) ×1 IMPLANT
SHEATH PROBE COVER 6X72 (BAG) ×1 IMPLANT
SHIELD RADPAD SCOOP 12X17 (MISCELLANEOUS) ×1 IMPLANT
SYR MEDRAD MARK 7 150ML (SYRINGE) ×3 IMPLANT
TAPE VIPERTRACK RADIOPAQ (MISCELLANEOUS) IMPLANT
TAPE VIPERTRACK RADIOPAQUE (MISCELLANEOUS) ×3
TRANSDUCER W/STOPCOCK (MISCELLANEOUS) ×3 IMPLANT
TRAY PV CATH (CUSTOM PROCEDURE TRAY) ×3 IMPLANT
WIRE BENTSON .035X145CM (WIRE) ×1 IMPLANT
WIRE HITORQ VERSACORE ST 145CM (WIRE) ×1 IMPLANT

## 2022-02-18 NOTE — H&P (Signed)
Cardiology Office Note     Date:  01/14/2022    ID:  Kenneth Romney., DOB 1960/08/26, MRN [id number]   PCP:  Colon Branch, MD           Cardiologist:  Dr. Angelena Form   No chief complaint on file.       History of Present Illness: Kenneth Viscomi. is a 61 y.o. male who is here today for reevaluation of peripheral arterial disease and claudication.  He has not been seen by me since 2021.  He has known history of coronary artery disease status post CABG in 2010 and PCI on SVG to OM later that year, hypertension, hyperlipidemia, chronic diastolic heart failure, polysubstance abuse and multiple other comorbidities. He was hospitalized in November, 2017 for ST elevation myocardial infarction complicated by ventricular fibrillation. Cardiac cath showed occluded SVG to OM which was treated successfully with PCI and drug-eluting stent placement.   He was seen in 2018 for bilateral leg claudication.  Noninvasive vascular evaluation showed an ABI of 0.82 on the right and 0.66 on the left.  Angiography was done in February 2018 which showed no significant aortoiliac disease other than occluded internal iliac arteries, occluded left SFA with three-vessel runoff below the knee and significant disease affecting the right proximal SFA. I performed successful atherectomy of the left SFA followed by drug-coated balloon angioplasty and stenting of the midsegment.   Unfortunately, he continues to smoke and has significant COPD.  He is supposed to be on home oxygen but does not use it all the time.[name]  He was hospitalized last month with acute on chronic diastolic heart failure improved with diuresis.   He returns now with severe right leg claudication with no symptoms on the left side.  This happens with short distance walking.  No lower extremity ulceration.       Past Medical History:  Diagnosis Date   (HFpEF) heart failure with preserved ejection fraction ([place])      Echo 6/22: EF 55-60, no RWMA, GR 3  DD, elevated LVEDP, normal RVSF, RVSP 53.2, mild to moderate AV sclerosis without stenosis, dilated aortic root (41 mm), ascending aorta 37 mm   Abnormality of gait     Anxiety     CAD (coronary artery disease)      a. 10/2008 Inferolateral STEMI: 3VD w/ occluded LCX (PTCA)-->CABG x 3 (LIMA->LAD, VG->OM, VG->PDA);  b. 06/2009 NSTEMI/PCI: VG->OM 100 (BMS);  c. 05/2013 native 3VD, 3/3 patent grafts. d. 06/2016: Inferior STEMI w/ occluded SVG-OM (DES placed). LIMA-LAD and SVG-PDA patent.   Chronic bronchitis (HCC)     Chronic diastolic CHF (congestive heart failure) ([place])      a. 02/2015 Echo: EF 50-55%, distal septal, apical, inferobasal HK, mild LVH, mild MR, mildly dil LA.   Cocaine use     Colon polyps      a. 09/2015 colonscopy: multiple sessile polyps, path negative for high grade dysplasia.   Depression     Emphysema (subcutaneous) (surgical) resulting from a procedure 10/2008.    Bleb resected at time of 10/2008 CABG. Marketed emphysema noted on surgical report.   Esophagitis      a. 2017 EGD: esophagitis, duodenitis.   ETOH abuse     GERD (gastroesophageal reflux disease)     Hemianopia, homonymous, right      "can't see out the right side of either eye since stroke in 2016/pt description 09/23/2016   Hepatic steatosis     Hiatal hernia  History of nuclear stress test 08/2017    Nuclear stress test 1/19: EF 35, inf-lat and ant-lat, apical sept/apical scar; no ischemia; Intermediate Risk   Hyperlipidemia     Hypertension     Iron deficiency anemia 2010   Morbid obesity ([place])     Myocardial infarction [facility]) "several"   PAD (peripheral artery disease) ([place])     Pancreatitis 06/2015   Sleep apnea      "don't have a mask" (09/23/2016)   Stroke ([place]) 02/2015    a. 02/2015 Carotid U/S: 1-39% bilat ICA stenosis; "left me blind" (09/23/2016)   Tobacco abuse      still smokes   Tubular adenoma of colon             Past Surgical History:  Procedure Laterality Date   ABDOMINAL  AORTOGRAM W/LOWER EXTREMITY N/A 09/23/2016    Procedure: Abdominal Aortogram w/Lower Extremity;  Surgeon: Wellington Hampshire, MD;  Location: [place] [facility];  Service: Cardiovascular;  Laterality: N/A;   CARDIAC CATHETERIZATION   11/07/2008    EF of 50-55% -- normal LV systolic function, acute inferolateral ST elevation MI,  PTCA of the circumflex artery -- [name]. [name], [name]   CARDIAC CATHETERIZATION N/A 07/07/2016    Procedure: Left Heart Cath and Cors/Grafts Angiography;  Surgeon: [name], MD;  Location: [place] [facility];  Service: Cardiovascular;  Laterality: N/A;   CARDIAC CATHETERIZATION N/A 07/07/2016    Procedure: Coronary Stent Intervention;  Surgeon: [name], MD;  Location: [place] [facility];  Service: Cardiovascular;  Laterality: N/A;   CORONARY ANGIOPLASTY       CORONARY ARTERY BYPASS GRAFT        "CABG X4"   LEFT HEART CATH AND CORS/GRAFTS ANGIOGRAPHY N/A 10/01/2017    Procedure: LEFT HEART CATH AND CORS/GRAFTS ANGIOGRAPHY;  Surgeon: Burnell Blanks, MD;  Location: [place] [facility];  Service: Cardiovascular;  Laterality: N/A;   LEFT HEART CATHETERIZATION WITH CORONARY/GRAFT ANGIOGRAM N/A 05/15/2013    Procedure: LEFT HEART CATHETERIZATION WITH Beatrix Fetters;  Surgeon: [name], MD;  [facility];  Service: Cardiovascular;  Laterality: N/A;   PERIPHERAL VASCULAR INTERVENTION   09/23/2016    Procedure: Peripheral Vascular Intervention;  Surgeon: Wellington Hampshire, MD;  Location: [place] [facility];  Service: Cardiovascular;;  Lt. SFA              Current Outpatient Medications  Medication Sig Dispense Refill   aspirin EC 81 MG EC tablet Take 1 tablet (81 mg total) by mouth daily with breakfast. 30 tablet 2   atorvastatin (LIPITOR) 80 MG tablet TAKE 1 TABLET EVERY DAY AT 6:00PM (Patient taking differently: Take 80 mg by mouth at bedtime.) 90 tablet 2   augmented betamethasone dipropionate (DIPROLENE-AF) 0.05 % cream APPLY  TOPICALLY TWICE DAILY (Patient taking differently: Apply 1 application. topically 2 (two) times daily.) 50 g 1   Blood Glucose Monitoring Suppl (ONETOUCH VERIO REFLECT) w/Device KIT Use to check blood glucose daily (DX: type 2 DM - E 11.51) 1 kit 0   budesonide-formoterol (SYMBICORT) 160-4.5 MCG/ACT inhaler Inhale 2 puffs into the lungs 2 (two) times daily. (Patient taking differently: Inhale 1-2 puffs into the lungs See admin instructions. Inhale 1 puff into the lungs in the morning and 2 puffs in the evening) 30 g 1   Cholecalciferol (VITAMIN D-3) 125 MCG (5000 UT) TABS Take 5,000 Units by mouth daily.       clopidogrel (PLAVIX) 75 MG  tablet TAKE 1 TABLET EVERY DAY (Patient taking differently: Take 75 mg by mouth in the morning.) 90 tablet 3   Cyanocobalamin (VITAMIN B-12) 5000 MCG SUBL Place 5,000 mcg under the tongue daily.       diclofenac Sodium (VOLTAREN) 1 % GEL Apply 1 application. topically daily as needed for pain.       empagliflozin (JARDIANCE) 10 MG TABS tablet Take 1 tablet (10 mg total) by mouth daily. 30 tablet 0   ezetimibe (ZETIA) 10 MG tablet Take 1 tablet (10 mg total) by mouth daily. (Patient taking differently: Take 10 mg by mouth at bedtime.) 90 tablet 3   feeding supplement (ENSURE ENLIVE / ENSURE PLUS) LIQD Take 237 mLs by mouth 2 (two) times daily between meals. 14220 mL 0   fenofibrate (TRICOR) 145 MG tablet TAKE 1 TABLET EVERY DAY (Patient taking differently: Take 145 mg by mouth in the morning.) 90 tablet 1   folic acid (FOLVITE) 1 MG tablet Take 1 tablet (1 mg total) by mouth daily. 90 tablet 3   furosemide (LASIX) 20 MG tablet Take 3 tablets (60 mg total) by mouth daily. 30 tablet 0   gabapentin (NEURONTIN) 100 MG capsule Take 2 capsules (200 mg total) by mouth 3 (three) times daily. (Patient taking differently: Take 100-200 mg by mouth See admin instructions. Take 200 mg by mouth in the morning and 100 mg at bedtime) 180 capsule 0   glucose blood (ONETOUCH VERIO) test  strip Use to check blood glucose once a day (DX: 11.51 - type 2 DM) 100 each 4   isosorbide mononitrate (IMDUR) 60 MG 24 hr tablet Take 1.5 tablets (90 mg total) by mouth daily. 135 tablet 3   Multiple Vitamins-Minerals (CERTAVITE/ANTIOXIDANTS) TABS Take 1 tablet by mouth daily. 30 tablet 0   nitroGLYCERIN (NITROSTAT) 0.4 MG SL tablet Place 1 tablet (0.4 mg total) under the tongue every 5 (five) minutes x 3 doses as needed for chest pain. 25 tablet 2   OneTouch Delica Lancets 47Q MISC Use to check blood glucose once a day (DX: E11.51 type 2 DM) 100 each 4   OXYGEN Inhale 2-4 L/min into the lungs as needed (for shortness of breath).       pantoprazole (PROTONIX) 40 MG tablet TAKE 1 TABLET TWICE DAILY (Patient taking differently: Take 40 mg by mouth 2 (two) times daily before a meal.) 180 tablet 2   PRESCRIPTION MEDICATION CPAP- At bedtime       spironolactone (ALDACTONE) 25 MG tablet Take 0.5 tablets (12.5 mg total) by mouth daily. 45 tablet 3   tamsulosin (FLOMAX) 0.4 MG CAPS capsule Take 1 capsule (0.4 mg total) by mouth in the morning and at bedtime. 180 capsule 1   thiamine 100 MG tablet Take 1 tablet (100 mg total) by mouth daily. 90 tablet 3   traZODone (DESYREL) 100 MG tablet Take 1 tablet (100 mg total) by mouth at bedtime. (Patient taking differently: Take 200 mg by mouth at bedtime.) 180 tablet 1   valsartan (DIOVAN) 40 MG tablet Take 1 tablet (40 mg total) by mouth daily. 30 tablet 0    No current facility-administered medications for this visit.      Allergies:   Bupropion and Metformin and related      Social History:  The patient  reports that he has been smoking cigarettes. He started smoking about 47 years ago. He has a 22.50 pack-year smoking history. He has never used smokeless tobacco. He reports current alcohol use of  about 3.0 standard drinks per week. He reports current drug use. Drug: Marijuana.    Family History:  The patient's family history includes Colon cancer in his  father; Diabetes in his mother; Heart disease in his mother; Heart failure in his mother.      ROS:  Please see the history of present illness.   Otherwise, review of systems are positive for none.   All other systems are reviewed and negative.      PHYSICAL EXAM: VS:  BP 132/80   Pulse 73   Ht '5\' 11"'  (1.803 m)   Wt 234 lb (106.1 kg)   SpO2 91%   BMI 32.64 kg/m  , BMI Body mass index is 32.64 kg/m. GEN: Well nourished, well developed, in no acute distress  HEENT: normal  Neck: no JVD, carotid bruits, or masses Cardiac: RRR; no murmurs, rubs, or gallops,no edema  Respiratory:  clear to auscultation bilaterally, normal work of breathing GI: soft, nontender, nondistended, + BS MS: no deformity or atrophy  Skin: warm and dry, no rash Neuro:  Strength and sensation are intact Psych: euthymic mood, full affect Vascular: Dorsalis pedis and posterior tibialis are normal on the left side but not palpable on the right side.     EKG:  EKG is not ordered today.       Recent Labs: 04/11/2021: ALT 15 05/07/2021: TSH 1.08 12/23/2021: B Natriuretic Peptide 184.6 12/24/2021: Hemoglobin 14.8; Platelets 195 12/26/2021: BUN 32; Creatinine, Ser 1.39; Magnesium 2.3; Potassium 4.1; Sodium 137      Lipid Panel         Component Value Date/Time    CHOL 141 05/07/2021 1347    CHOL 171 11/14/2019 1441    TRIG 142.0 05/07/2021 1347    HDL 41.70 05/07/2021 1347    HDL 37 (L) 11/14/2019 1441    CHOLHDL 3 05/07/2021 1347    VLDL 28.4 05/07/2021 1347    LDLCALC 71 05/07/2021 1347    LDLCALC 116 (H) 11/14/2019 1441    LDLDIRECT 116.0 05/12/2013 1517           Wt Readings from Last 3 Encounters:  01/13/22 234 lb (106.1 kg)  12/26/21 229 lb 6.4 oz (104.1 kg)  11/05/21 236 lb 2 oz (107.1 kg)                View : No data to display.                 ASSESSMENT AND PLAN:   1.  Peripheral arterial disease: The patient had previous left SFA endovascular intervention.  He is to be  asymptomatic on the left side with palpable distal pulses.  However, he now complains of severe right leg claudication with no palpable pulses distally.  I requested an ABI and duplex for evaluation.  He might require angiography and endovascular intervention but I discussed with him the importance of smoking cessation first.     2. Coronary artery disease without angina: Cardiac cath in 2019 showed patent grafts.  No chest pain but has significant shortness of breath which is likely related to COPD and chronic diastolic heart failure.   3. Tobacco use: I again discussed with him the importance of smoking cessation.   4.  Mixed hyperlipidemia: He is currently on high-dose atorvastatin, Zetia and fenofibrate.   5.  Chronic diastolic heart failure: He appears to be euvolemic on furosemide 60 mg daily.  He is on good medical therapy.     Disposition:  FU with me in 6 months but earlier if lower extremity Doppler is significantly abnormal.   Signed,   Kathlyn Sacramento, MD  01/14/2022 4:32 PM    [facility]  Addendum on 02/18/2022: The patient has severe right calf claudication.  Noninvasive vascular testing showed decreased ABI on the right with evidence of severe mid SFA stenosis.  The left SFA was patent. The patient presents for lower extremity angiography and possible endovascular intervention.  There has been no change in his symptoms.  The procedure was supposed to be done last week but had to be canceled due to an emergency. His physical exam is unchanged.

## 2022-02-18 NOTE — Progress Notes (Signed)
Patient was given discharge instructions. He verbalized understanding. 

## 2022-02-19 ENCOUNTER — Other Ambulatory Visit: Payer: Self-pay | Admitting: *Deleted

## 2022-02-19 DIAGNOSIS — I739 Peripheral vascular disease, unspecified: Secondary | ICD-10-CM

## 2022-02-19 MED FILL — Heparin Sod (Porcine)-NaCl IV Soln 1000 Unit/500ML-0.9%: INTRAVENOUS | Qty: 1000 | Status: AC

## 2022-02-25 ENCOUNTER — Telehealth: Payer: Self-pay | Admitting: Cardiovascular Disease

## 2022-02-25 NOTE — Telephone Encounter (Signed)
LVM x3 to schedule appt with Dr. Fletcher Anon after his LEA on 03/13/22. Will send mychart message.

## 2022-03-01 MED ORDER — BUPROPION HCL ER (SR) 150 MG PO TB12: ORAL_TABLET | ORAL | 1 refills | Status: DC

## 2022-03-01 NOTE — Patient Instructions (Signed)
Mr. Pink,  It was a pleasure speaking with you  Below is a summary of your health goals. You can find a copy of your updated Chronic Care Management care plan in your MyChart.  Patient Goals/Self-Care Activities take medications as prescribed,  Restart Jardiance '10mg'$  - take 1 tablet daily Restart bupropion to help with smoking cravings - start with '150mg'$  each morning for first 3 days, then increase to '150mg'$  twice a day thereafter.  focus on medication adherence by utilizing mail order pharmacy and contacting clinical pharmacist [name] [place] [phone] or [phone]) about any medications questions or concerns,  check blood pressure 2 to 3 times per week, document, and provide at future appointments,  weigh daily, and contact provider if weight gain of more than 3 lbs in 24 hours or 5 lbs in 1 week  Continue valsartan at lower dose of '40mg'$  daily.     If you have any questions or concerns, please feel free to contact me either at the phone number below or with a MyChart message.   Keep up the good work!  Cherre Robins, PharmD Clinical Pharmacist [place] [phone] (direct line)  [phone] (main office number)   Patient verbalizes understanding of instructions and care plan provided today and agrees to view in [place]. Active MyChart status and patient understanding of how to access instructions and care plan via MyChart confirmed with patient.

## 2022-03-04 ENCOUNTER — Ambulatory Visit: Payer: Medicare HMO | Admitting: Internal Medicine

## 2022-03-06 ENCOUNTER — Telehealth: Payer: Self-pay | Admitting: Cardiovascular Disease

## 2022-03-06 ENCOUNTER — Telehealth: Payer: Self-pay | Admitting: Pharmacist

## 2022-03-06 NOTE — Telephone Encounter (Signed)
Called [facility] to see if patient has picked up [place] and what co-pay was. Patient has NOT picked up Jardiance yet. Reported co-pay was $45 for 30 days.  Patient reports cost of Vania Rea is high. We applied for LIS / Extra Help today. If approved, copay would decrease to around $10 per month. If denied LIS / Extra help we can try to get from patient assistance program but [facility] requires denial letter for LIS.  Unfortunately our office does not have samples of Jardiance. Reached out to Dr Reatha Armour office to see if they have samples for patient - office if checking.

## 2022-03-06 NOTE — Telephone Encounter (Signed)
Called the patient and explained the reasoning for having the LEA and ABI post procedure. The patient needs to rescheduled for a later time in the day due to lack of transportation. Message sent to scheduling.

## 2022-03-06 NOTE — Telephone Encounter (Signed)
Patient called to get clarification on his LE Arterial test to be done on 03/13/22.

## 2022-03-06 NOTE — Telephone Encounter (Signed)
Patient calling the office for samples of medication:   1.  What medication and dosage are you requesting samples for? empagliflozin (JARDIANCE) 10 MG TABS tablet  2.  Are you currently out of this medication? No   [facility] is calling to see if there are samples available for this patient. They state that they do not have any available at their clinic.

## 2022-03-09 DIAGNOSIS — I5032 Chronic diastolic (congestive) heart failure: Secondary | ICD-10-CM | POA: Diagnosis not present

## 2022-03-09 DIAGNOSIS — I10 Essential (primary) hypertension: Secondary | ICD-10-CM

## 2022-03-09 DIAGNOSIS — J449 Chronic obstructive pulmonary disease, unspecified: Secondary | ICD-10-CM | POA: Diagnosis not present

## 2022-03-10 ENCOUNTER — Ambulatory Visit (HOSPITAL_COMMUNITY): Payer: Medicare HMO

## 2022-03-10 NOTE — Telephone Encounter (Signed)
Left voicemail for patient, informing him that samples of Jardiance were left for him at the front check-in desk at [facility] [employer].

## 2022-03-12 ENCOUNTER — Telehealth: Payer: Self-pay

## 2022-03-12 ENCOUNTER — Telehealth: Payer: Self-pay | Admitting: *Deleted

## 2022-03-12 ENCOUNTER — Telehealth: Payer: Medicare HMO

## 2022-03-12 NOTE — Telephone Encounter (Signed)
Left message for patient to call back to reschedule follow up lung cancer screening CT scan.

## 2022-03-12 NOTE — Telephone Encounter (Signed)
  Care Management   Follow Up Note   03/12/2022 Name: [name]. MRN: [id number] DOB: April 09, 1961   Referred by: Colon Branch, MD Reason for referral : No chief complaint on file.   An unsuccessful telephone outreach was attempted today. The patient was referred to the case management team for assistance with care management and care coordination.   Follow Up Plan: The care management team will reach out to the patient again over the next 30 days.   Thea Silversmith, RN, MSN, BSN, CCM Care Management Coordinator [facility] [phone]

## 2022-03-13 ENCOUNTER — Ambulatory Visit (HOSPITAL_COMMUNITY): Payer: Medicare HMO

## 2022-03-17 ENCOUNTER — Telehealth: Payer: Self-pay

## 2022-03-17 ENCOUNTER — Ambulatory Visit (INDEPENDENT_AMBULATORY_CARE_PROVIDER_SITE_OTHER): Payer: Medicare HMO | Admitting: Pharmacist

## 2022-03-17 DIAGNOSIS — F17200 Nicotine dependence, unspecified, uncomplicated: Secondary | ICD-10-CM

## 2022-03-17 DIAGNOSIS — I739 Peripheral vascular disease, unspecified: Secondary | ICD-10-CM

## 2022-03-17 DIAGNOSIS — I10 Essential (primary) hypertension: Secondary | ICD-10-CM

## 2022-03-17 DIAGNOSIS — I5032 Chronic diastolic (congestive) heart failure: Secondary | ICD-10-CM

## 2022-03-17 DIAGNOSIS — E1151 Type 2 diabetes mellitus with diabetic peripheral angiopathy without gangrene: Secondary | ICD-10-CM

## 2022-03-17 NOTE — Patient Instructions (Signed)
[name] It was a pleasure speaking with you today.  Below is a summary of your health goals and summary of our recent visit. You can also view your updated Chronic Care Management Care plan through your MyChart account.   Patient Goals/Self-Care Activities take medications as prescribed,  focus on medication adherence by utilizing mail order pharmacy and contacting clinical pharmacist [name] [place] [phone] or [phone]) about any medications questions or concerns,  check blood pressure 2 to 3 times per week, document, and provide at future appointments,  weigh daily, and contact provider if weight gain of more than 3 lbs in 24 hours or 5 lbs in 1 week  Continue valsartan at lower dose of '40mg'$  daily.   As always if you have any questions or concerns especially regarding medications, please feel free to contact me either at the phone number below or with a MyChart message.   Keep up the good work!  [name], PharmD Clinical Pharmacist [place] [phone] (direct line)  [phone] (main office number)   Patient verbalizes understanding of instructions and care plan provided today and agrees to view in [place]. Active MyChart status and patient understanding of how to access instructions and care plan via MyChart confirmed with patient.

## 2022-03-17 NOTE — Chronic Care Management (AMB) (Signed)
//   Chronic Care Management Pharmacy Note  02/16/2022 Name:  [name]. MRN:  [id number] DOB:  [birth year]  Summary:   CHF: patient restarted Jardiance a few days ago. He tolerating addition of Jardiance 71m daily that was added after hospitalization in mid May 2023 for CHF exacerbation. He will pick up sample from cardiologist office 03/19/2022. He has not received letter regarding applying for Medicare Extra Help. If approved he can get Janriance for about $10 per month. If denied we can apply for medication assistance program for Jardiance.  COPD / tobacco abuse: Mr. MShrefflerhas recommitted to quitting smoking. He reports his throat feels much better and he sounds better today. He is using nicotine 244mpatches and plans to start using nicotine lozenges as needed when he receives shipment from [facility]. He has decreased number of cigarettes smoked per day from 3PPD to 5 cigarettes per day. He was not able to tolerate bupropion or Chantix.     Follow up: Patient has phone follow up with nurse care manager in 2 week. Clinical Pharmacist Practitioner will follow up by phone in 3 to 4 weeks.   Subjective: ChGala Romneyis an [age].o. year old male who is a primary patient of [name].  The CCM team was consulted for assistance with disease management and care coordination needs.    Collaboration with patient by phone   for follow up visit in response to provider referral for pharmacy case management and/or care coordination services.   Consent to Services:  The patient was given information about Chronic Care Management services, agreed to services, and gave verbal consent prior to initiation of services.  Please see initial visit note for detailed documentation.   Patient Care Team: [name] as PCP - General (Internal Medicine) [name] as PCP - Cardiology (Cardiology) [name] as Consulting Physician (Ophthalmology) [name] as Consulting Physician (Gastroenterology) [name] as Consulting Physician (Orthopedic Surgery) [name] as Case Manager [name] (Pharmacist) [name] (Inactive) as Consulting Physician (Otolaryngology)  Recent PCP office visits:  03/39/2023 - PCP (Dr PaLarose Kellsfollow up. Labs checked. No med changes.  Recent [facility] cons[facility]:  [facility] of lower [facility].  02/11/2022 - was suppose to have procedure but was rescheduled due to dealing with an emergency. Rescheduled for 02/18/2022 at 10:30am (arrival at 8:30a) 01/13/2022 - Cardio (Dr ArFletcher AnonF/U PAD and claudication. Ordered ABI and lower ext arterial duplex. F/U 6 months. no med changes noted.  Recent Hospitalizations:  12[facility]23/2021 to [facility]  Medications started at discharge: Jardiance 1051maily  Medications stopped at discharge: none Medications changed at discharge: decreased valsartan dose to 37m72mily and changed furosemide to 20mg24make 3 tablets = 60mg 43m a day.  Objective:  Lab Results  Component Value Date   CREATININE 1.09 02/05/2022   CREATININE 1.39 (H) 12/26/2021   CREATININE 1.41 (H) 12/25/2021    Lab Results  Component Value Date   HGBA1C 6.8 (H) 11/05/2021   Last diabetic Eye exam:  Lab Results  Component Value Date/Time   HMDIABEYEEXA No Retinopathy 06/30/2021 12:00 AM    Last diabetic Foot exam: No results found for: "HMDIABFOOTEX"      Component Value Date/Time   CHOL 141 05/07/2021 1347   CHOL 171 11/14/2019 1441   TRIG  142.0 05/07/2021 1347   HDL 41.70 05/07/2021 1347   HDL 37 (L) 11/14/2019 1441   CHOLHDL 3 05/07/2021 1347   VLDL 28.4 05/07/2021 1347   LDLCALC 71 05/07/2021 1347   LDLCALC 116 (H) 11/14/2019 1441   LDLDIRECT 116.0 05/12/2013 1517       Latest Ref Rng & Units 04/11/2021   10:56 AM 07/10/2020    2:45 PM  11/26/2018   12:22 PM  Hepatic Function  Total Protein 6.5 - 8.1 g/dL 7.9  7.1  7.8   Albumin 3.5 - 5.0 g/dL 4.3  4.0  3.9   AST 15 - 41 U/L '18  12  22   ' ALT 0 - 44 U/L '15  13  19   ' Alk Phosphatase 38 - 126 U/L 66  135  88   Total Bilirubin 0.3 - 1.2 mg/dL 1.5  1.2  2.1     Lab Results  Component Value Date/Time   TSH 1.895 07/07/2016 06:31 PM   TSH 1.88 05/05/2013 12:14 PM   TSH 2.290 **Test methodology is 3rd generation TSH** 06/23/2009 07:55 AM       Latest Ref Rng & Units 02/05/2022    2:47 PM 12/24/2021    2:04 AM 12/23/2021   12:49 PM  CBC  WBC 3.4 - 10.8 x10E3/uL 7.8  9.0  7.1   Hemoglobin 13.0 - 17.7 g/dL 15.4  14.8  15.0   Hematocrit 37.5 - 51.0 % 45.2  45.2  46.3   Platelets 150 - 450 x10E3/uL 206  195  216     Lab Results  Component Value Date/Time   VD25OH 39.28 11/05/2021 04:04 PM   VD25OH 10.8 (L) 11/03/2016 02:53 PM    Clinical ASCVD: Yes  The ASCVD Risk score (Arnett DK, et al., 2019) failed to calculate for the following reasons:   The patient has a prior MI or stroke diagnosis     Social History   Tobacco Use  Smoking Status Every Day   Packs/day: 0.50   Years: 45.00   Total pack years: 22.50   Types: Cigarettes   Start date: 1976   Passive exposure: Current  Smokeless Tobacco Never  Tobacco Comments   1 pack daily- wants to quit   BP Readings from Last 3 Encounters:  02/18/22 121/69  02/11/22 123/73  01/13/22 132/80   Pulse Readings from Last 3 Encounters:  02/18/22 68  02/11/22 65  01/13/22 73   Wt Readings from Last 3 Encounters:  02/18/22 235 lb (106.6 kg)  02/11/22 235 lb (106.6 kg)  01/13/22 234 lb (106.1 kg)    Assessment: Review of patient past medical history, allergies, medications, health status, including review of consultants reports, laboratory and other test data, was performed as part of comprehensive evaluation and provision of chronic care management services.   SDOH:  (Social Determinants of Health) assessments  and interventions performed:  SDOH Interventions    Flowsheet Row Most Recent Value  SDOH Interventions   Social Connections Interventions Intervention Not Indicated           CCM Care Plan  Allergies  Allergen Reactions   Metformin And Related Diarrhea and Other (See Comments)    Severe diarrhea   Wellbutrin [Bupropion] Nausea Only    Medications Reviewed Today     Reviewed by Cherre Robins, RPH-CPP (Pharmacist) on 03/17/22 at [facility] Status: <None>   Medication Order Taking? Sig Documenting Provider Last Dose Status Informant  aspirin EC 81 MG EC tablet [id number]  Yes Take 1 tablet (81 mg total) by mouth daily with breakfast. Roxan Hockey, MD Taking Active Multiple Informants  atorvastatin (LIPITOR) 80 MG tablet [id number] Yes TAKE 1 TABLET EVERY DAY AT 6:00PM  Patient taking differently: Take 80 mg by mouth at bedtime.   Burnell Blanks, MD Taking Active Multiple Informants  augmented betamethasone dipropionate (DIPROLENE-AF) 0.05 % cream [id number] Yes Grafton, MD Taking Active Multiple Informants  Blood Glucose Monitoring Suppl [facility] VERIO REFLECT) w/Device KIT [id number] Yes Use to check blood glucose daily (DX: type 2 DM - E 11.51) Colon Branch, MD Taking Active Multiple Informants  budesonide-formoterol [facility]) 160-4.5 MCG/ACT inhaler [id number] Yes Inhale 2 puffs into the lungs 2 (two) times daily.  Patient taking differently: Inhale 1-2 puffs into the lungs See admin instructions. Inhale 1 puff into the lungs in the morning and 2 puffs in the evening   [name] E, MD Taking Active Multiple Informants  Cholecalciferol (VITAMIN D-3) 125 MCG (5000 UT) TABS [id number] Yes Take 5,000 Units by mouth daily. [provider] Taking Active Multiple Informants  clopidogrel (PLAVIX) 75 MG tablet [id number] Yes TAKE 1 TABLET EVERY DAY Burnell Blanks, MD Taking Active Multiple Informants  Cyanocobalamin (VITAMIN  B-12) 5000 MCG SUBL [id number] Yes Place 5,000 mcg under the tongue daily. [provider] Taking Active Multiple Informants  diclofenac Sodium (VOLTAREN) 1 % GEL [id number] Yes Apply 1 application  topically daily as needed for pain. [provider] Taking Active Multiple Informants  empagliflozin (JARDIANCE) 10 MG TABS tablet [id number] Yes Take 1 tablet (10 mg total) by mouth daily. Colon Branch, MD Taking Active   ezetimibe (ZETIA) 10 MG tablet [id number] Yes Take 1 tablet (10 mg total) by mouth daily.  Patient taking differently: Take 10 mg by mouth every evening.   Burnell Blanks, MD Taking Active Multiple Informants  fenofibrate (TRICOR) 145 MG tablet [id number] Yes TAKE 1 TABLET EVERY DAY Colon Branch, MD Taking Active Multiple Informants  folic acid (FOLVITE) 1 MG tablet [id number] Yes Take 1 tablet (1 mg total) by mouth daily. Roxan Hockey, MD Taking Active Multiple Informants  furosemide (LASIX) 20 MG tablet [id number] Yes Take 3 tablets (60 mg total) by mouth daily. Arrien, Jimmy Picket, MD Taking Active Multiple Informants  gabapentin (NEURONTIN) 100 MG capsule [id number] No Take 2 capsules (200 mg total) by mouth 3 (three) times daily.  Patient not taking: Reported on 03/17/2022   Colon Branch, MD Not Taking Consider Medication Status and Discontinue Multiple Informants           Med Note Dessie Coma   Tue Dec 23, 2021  8:41 PM)    glucose blood [facility] VERIO) test strip [id number] Yes Use to check blood glucose once a day (DX: 11.51 - type 2 DM) Colon Branch, MD Taking Active Multiple Informants  isosorbide mononitrate (IMDUR) 60 MG 24 hr tablet [id number] Yes TAKE 1 AND 1/2 TABLETS EVERY DAY Burnell Blanks, MD Taking Active Multiple Informants  nicotine (NICOTINE STEP 1) 21 mg/24hr patch [id number] Yes Place 1 patch (21 mg total) onto the skin daily. Colon Branch, MD Taking Active   nitroGLYCERIN (NITROSTAT) 0.4 MG SL tablet [id number] Yes Place 1 tablet  (0.4 mg total) under the tongue every 5 (five) minutes x 3 doses as needed for chest pain. Colon Branch, MD Taking Active Multiple Informants           Med Note Darrall Dears, [facility]  N   Tue Dec 23, 2021  2:21 PM)    Jonetta Speak Lancets 81E MISC [id number] Yes Use to check blood glucose once a day (DX: E11.51 type 2 DM) Colon Branch, MD Taking Active Multiple Informants  OXYGEN [id number] Yes Inhale 2-4 L/min into the lungs as needed (for shortness of breath). [provider] Taking Active Multiple Informants  pantoprazole (PROTONIX) 40 MG tablet [id number] Yes TAKE 1 TABLET TWICE DAILY Burnell Blanks, MD Taking Active Multiple Informants  PRESCRIPTION MEDICATION [id number] Yes CPAP- At bedtime [provider] Taking Active Multiple Informants  spironolactone (ALDACTONE) 25 MG tablet [id number] Yes TAKE 1/2 TABLET EVERY DAY Burnell Blanks, MD Taking Active Multiple Informants  tamsulosin (FLOMAX) 0.4 MG CAPS capsule [id number] Yes Take 1 capsule (0.4 mg total) by mouth in the morning and at bedtime. Colon Branch, MD Taking Active Multiple Informants  thiamine 100 MG tablet [id number] Yes Take 1 tablet (100 mg total) by mouth daily. Roxan Hockey, MD Taking Active Multiple Informants  traZODone (DESYREL) 100 MG tablet [id number] Yes Take 1 tablet (100 mg total) by mouth at bedtime. Colon Branch, MD Taking Active Multiple Informants  valsartan (DIOVAN) 40 MG tablet [id number] Yes Take 1 tablet (40 mg total) by mouth daily. Colon Branch, MD Taking Active             Patient Active Problem List   Diagnosis Date Noted   Class 1 obesity 12/25/2021   AKI (acute kidney injury) ([place]) 12/25/2021   Acute on chronic diastolic heart failure ([place]) 12/23/2021   Chronic respiratory failure with hypoxia ([place]) 12/23/2021   COPD exacerbation ([place]) 12/23/2021   Alcohol use 12/23/2021   (HFpEF) heart failure with preserved ejection fraction (HCC)    DOE (dyspnea on exertion)  11/07/2020   Type 2 diabetes mellitus with hyperlipidemia ([place]) 03/18/2020   Prolapsed lumbar disc 01/25/2020   Hiatal hernia    Hepatic steatosis    Hemianopia, homonymous, right    ETOH abuse    Esophagitis    Chronic diastolic CHF (congestive heart failure) (HCC)    Chronic bronchitis (HCC)    CAD (coronary artery disease)    PAD (peripheral artery disease) ([place]) 09/23/2016   Morbid obesity ([place])    Chronic pain syndrome 10/23/2015   GERD (gastroesophageal reflux disease) 08/07/2015   PCP NOTES >>>>> 06/01/2015   Acute ischemic stroke ([place]) 02/20/2015   BPH (benign prostatic hyperplasia) 02/20/2015   OSA (obstructive sleep apnea) ?? 05/05/2013   Erectile dysfunction 03/17/2012   Emphysema (subcutaneous) (surgical) resulting from a procedure 10/08/2008   Iron deficiency anemia 08/10/2008   ABNORMALITY OF GAIT 08/06/2008   Cigarette smoker 07/27/2008   Essential hypertension 07/26/2008   ST elevation myocardial infarction involving left circumflex coronary artery ([place]) 07/26/2008    Immunization History  Administered Date(s) Administered   PFIZER(Purple Top)SARS-COV-2 Vaccination 11/17/2019, 12/11/2019, 07/18/2020   Pneumococcal Polysaccharide-23 11/03/2016   Tdap 11/03/2016    Conditions to be addressed/monitored: CHF, CAD, HTN, HLD, Hypertriglyceridemia, COPD, DMII, and BPH; OSA; GERD; fatty liver; h/o strokePAD; sleep disturbance; anxiety/ depression  Care Plan : Pharmacy Care Plan  Updates made by Cherre Robins, RPH-CPP since 03/17/2022 12:00 AM     Problem: HTN; CHF: CAD; GERD with esophagitis; depression; BPH; COPD; bronchitis; stroke; elevated TG and hyperlipidemia; type 2 DM with h/o pancreatitis   Priority: High  Onset Date: 03/18/2021     Long-Range Goal: Management of chronic conditions and medications   Start Date: 03/18/2021  Recent  Progress: On track  Priority: High  Note:   Current Barriers:  Unable to independently monitor therapeutic efficacy Unable  to achieve control of Hyperlipidemia  Unable to maintain control of HTN, CHF Does not adhere to prescribed medication regimen Does not contact provider office for questions/concerns  Pharmacist Clinical Goal(s):  Over the next 90 days, patient will verbalize ability to afford treatment regimen achieve adherence to monitoring guidelines and medication adherence to achieve therapeutic efficacy achieve control of Hyperlipidemia as evidenced by LDL <70 maintain control of CHF, HTN and type 2 DM as evidenced by attaining goals listed below  adhere to prescribed medication regimen as evidenced by refill history contact provider office for questions/concerns as evidenced notation of same in electronic health record through collaboration with PharmD and provider.   Interventions: 1:1 collaboration with Colon Branch, MD regarding development and update of comprehensive plan of care as evidenced by provider attestation and co-signature Inter-disciplinary care team collaboration (see longitudinal plan of care) Comprehensive medication review performed; medication list updated in electronic medical record  Diabetes: Not at goal;  Goal A1c < 6.5% Lab Results  Component Value Date   HGBA1C 6.8 (H) 11/05/2021  Current treatment:   Jardiance 78m daily  Tried metformin in past but stopped due to GI side effects (tried 504mday and 25067may) History of pancreatitis (possibly related to alcohol use)   Current glucose readings: does not currently check BG at home Denies hypoglycemic/hyperglycemic symptoms Current meal patterns: over the last 2 to 3 weeks has been limiting bread and sweets Current exercise: none currently Interventions:  Counseled on A1c and BG goals ,  Awaiting decision on Medicare Extra Help - if approved Jardiance copay will drop to about $10 per 30 days; If denied can apply for medication assistance program for Jardiance. Patient to pick up sample of Jardiance from cardio office  03/19/2022 (he has 4 tablets left at home) Continue to limit serving sizes and intake of breads and sweets Check blood glucose daily to every other day:  Reviewed home blood glucose goals  Fasting blood glucose goal (before meals) = 80 to 130 Blood glucose goal after a meal = less than 180    Hypertension / CHF: Improving; goal: decrease symptoms of CHF, prevent exacerbations and BP <140/90.  Current treatment: Furosemide 2m6mtake 3 tablets = 60mg32mh morning Spironolactone 25mg 43mke 0.5 tablet = 12.5mg ea65mmorning Valsartan 40mg da42mJardiance 10mg dai67mHas scales but has not been checking weight at home. EF = 55-60% (12/24/2021) Past medications tried: lisinopril 10mg - ch53m to valsartan by pulmonologist; metoprolol - stopped due to low BP Denies hypotensive/hypertensive symptoms Recent home blood pressure: 134/64; 123/71  02/02/2022 was slightly elevated at 147/69 HR 60 - home blood pressure from visit with Juana WallThea Silversmithse Case Manager.  Interventions:  Reviewed blood pressure goals Reminded patient to rest / sit for at least 5 minutes prior to checking blood pressure.  Discussed signs and symptoms of CHF exacerbation - weight gain, SOB, abdominal fullness, swelling in legs or abdomen, Fatigue and weakness, changes in ability to perform usual activities, persistent cough or wheezing with white or pink blood-tinged mucus, nausea and lack of appetite Restart weighing daily - report weight gain of more than 3 lbs in 24 hours or 5 lbs in 1 week.   Hyperlipidemia / CAD / history of stroke / PAD: Uncontrolled but improving; LDL goal <55 (per cardio) ; Tg goal <150 Last LDL decreased from 116  to 71 (05/07/2021)  Current treatment: Atorvastatin 71m daily Ezetimibe 125mdaily  Fenofibrate 14531maily Aspirin 22m52mily with breakfast  Clopidogrel 75mg108mly Isosorbide ER 60mg 63mke 1.5 tablets = 90mg d50m Nitroglycerin 0.4mg - p78me 1 tablet under the tongue  and allow to dissolve as needed for chest pain Medications previously tried: Repatha - stopped due to cost; Brilenta  - stopped due to shortness of breath Patient reports he has restarted aspirin. He has not had a nosebleed in the last 2 weeks Interventions:  Continue aspirin 22mg dai25m Follow up as planned with Dr Arida  CoFletcher Anone current regimen for lipid lowering. Recheck lipids at next appointment with PCP.  Chronic Obstructive Pulmonary Disease / OSA: Controlled Current treatment: CPAP Symbicort (budesonide/formoterol) 160/4.5 - inhale 2 puffs into lungs twice a day Managed by pulmonologist - Dr Wert PFT'Melvyn Novas/22/2022  FEV1 1.93 (53 % ) ratio 0.70  p 13 % improvement from saba p 0 prior to study with   FV curve no signficant curvature Previous medications tried: Spiriva - not effective Interventions: (addressed at previous visit) Continue to follow up with pulmonologist  Discussed that Symbicort was for healing his lungs / for maintenance.  Encouraged him to take 2 puffs twice a day EVERY day Reminded patient to rinse mouth after using Symbicort.   Tobacco Use disorder:  Patient estarted smoking after quitting earlier in 2023. He has recommitted to quitting and has decreased number of cigarettes per day he is smoking from 3 PPD to 5 cigarettes per day. Current therapy:  Nicotine patch 21mg dail16mst therapies tried: Chantix (not helpful); Cold turkey - r[place]d; bupropion tried on 2 separate occasions- caused nausea  Interventions:  Encouraged patient to decreased number of cigarettes smoked per day.  He was provided with [employer] [facility] number and has contacted them. They are sending patient more nicotine patches and nicotine lozenges.  Could consider nortriptyline if needed in future as there has been some success for smoking cessation with nortriptyline. Could also help with sleep.   BPH / Urinary incontinence:  had episode of incontinence in a store recently.  He last saw  urologist 08/2021. At that time patient reported that tamsulosin 0.4mg bid wa19morking well. Patient is convinced that furosemide is cause of incontinence and while it can worsen urinary frequency, he is on a low dose of furosemide 20mg daily 24mes 40mg if need91m Current therapy:  Tamsulosin 0.4mg twice a d68mInterventions: (addressed at previous visit)   Recommended he discuss furosemide with cardiologist, Dr McAlhany and uAngelena Formncontinence with urologist, Dr Newsome.   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 Sometimes has difficulty reading labels on his prescription bottles.  His sister, Christy is helAlyse Lowtient with medications as she is able (she [place] in [place] frequently)   Interventions:   Reviewed refill history   Patient Goals/Self-Care Activities Over the next 90 days, patient will:  take medications as prescribed,  focus on medication adherence by utilizing mail order pharmacy and contacting clinical pharmacist (Yon Schiffman [name][phone]) [phone] questions or concerns,  check blood pressure 2 to 3 times per week, document, and provide at future appointments,  weigh daily, and contact provider if weight gain of more than 3 lbs in 24  hours or 5 lbs in 1 week  Continue valsartan at lower dose of 29m daily.   Follow Up Plan: Telephone follow up appointment with care management team member scheduled for:  2 to 4 weeks.             Medication Assistance:  none at this time but might need assistance if additional lipid lowering therapy needed in future.   Applied for LIS / extra help in 02/2022 - awaiting decision  Patient's [place] pharmacy [place]:  [place] [address]Phone: [phone]: [phone] [place] [address] [place]Phone: [phone]: [phone] [place]Phone: [phone]: [phone]  Uses pill box? No - patient has declined to use pill container but he is starting to use stickers on bottles to help identify time of day to take each medication  Follow Up:  Patient agrees to Care Plan and Follow-up.  Plan: follow up phone call with clinical pharmacist 3 to 4 weeks  TCherre Robins PharmD Clinical Pharmacist [facility]cho[facility]Reg[facility]

## 2022-03-17 NOTE — Telephone Encounter (Signed)
  Care Management   Follow Up Note   03/17/2022 Name: [name]. MRN: [id number] DOB: 1961-06-05   Referred by: Colon Branch, MD Reason for referral : No chief complaint on file.   A second unsuccessful telephone outreach was attempted today. The patient was referred to the case management team for assistance with care management and care coordination.   Follow Up Plan: The care management team will reach out to the patient again over the next 30 days.   Thea Silversmith, RN, MSN, BSN, CCM Care Management Coordinator [facility] [phone]

## 2022-03-19 ENCOUNTER — Ambulatory Visit (HOSPITAL_COMMUNITY)
Admission: RE | Admit: 2022-03-19 | Discharge: 2022-03-19 | Disposition: A | Discharge status: Home / Self Care | Payer: Medicare HMO | Source: Ambulatory Visit | Attending: Cardiology | Admitting: Cardiology

## 2022-03-19 ENCOUNTER — Telehealth: Payer: Self-pay | Admitting: Pharmacist

## 2022-03-19 DIAGNOSIS — I739 Peripheral vascular disease, unspecified: Secondary | ICD-10-CM | POA: Insufficient documentation

## 2022-03-19 NOTE — Telephone Encounter (Signed)
Returned patients call from earlier today with question regarding samples of Jardiance.  Patient saw Dr Fletcher Anon today but patient said they did not have any samples for him. Explained that Dr Reatha Armour office has samples of Jardiance for him. Patient voiced understanding and will go by their office tomorrow. He has 2 tablets left for tomorrow and 03/21/2022.

## 2022-03-24 ENCOUNTER — Other Ambulatory Visit: Payer: Self-pay | Admitting: *Deleted

## 2022-03-24 DIAGNOSIS — I739 Peripheral vascular disease, unspecified: Secondary | ICD-10-CM

## 2022-03-24 NOTE — Telephone Encounter (Signed)
LMTCB

## 2022-03-26 ENCOUNTER — Ambulatory Visit: Payer: Medicare HMO

## 2022-03-26 ENCOUNTER — Telehealth: Payer: Self-pay | Admitting: Adult Health

## 2022-03-26 NOTE — Telephone Encounter (Signed)
Patient called requesting to speak with [name] regarding CPAP machine. Patient states he needs a whole new machine. Attempted to schedule patient for appointment, patient denied.  Please advise. Call back number is [phone].

## 2022-03-26 NOTE — Patient Instructions (Addendum)
Visit Information  Thank you for taking time to visit with me today. Please don't hesitate to contact me if I can be of assistance to you before our next scheduled telephone appointment.  Following are the goals we discussed today:  Patient Goals/Self-Care Activities: [name] regarding questions about CPAP machine Contact Pulmonologist if you continue to have issues with CPAP machine Contact provider with health questions, concerns or if you do not feel that you are improving follow rescue plan if symptoms flare-up know when to call the doctor weight gain of 3 pounds in a 24 hour period, increased swelling in stomach, feet/legs or hands. Increased shortness of breath, having less energy than normal, new or worsening dizziness, if it is harder for you to breathe when lying down and you need to sit up to breathe or you feel that something is not right.  Attend provider appointments as scheduled Continue to monitor for signs/symptoms of COPD worsening: increase cough, shortness of breath or mucus production change for 2 days or more, your number 1 priority is to call your doctor. Eat Healthy: variety of foods, fruits, vegetables, lean meats, healthy fats. Avoid processed foods, saturated transfats, minimize salt intake  Our next appointment is by telephone on 05/05/22 at 1:15pm  Please call the care guide team at [phone] if you need to cancel or reschedule your appointment.   If you are experiencing a Mental Health or [place] or need someone to talk to, please call the Suicide and Crisis Lifeline: 988   Patient verbalizes understanding of instructions and care plan provided today and agrees to view in [place]. Active MyChart status and patient understanding of how to access instructions and care plan via MyChart confirmed with patient.      Thea Silversmith, RN, MSN, BSN, CCM Care Management Coordinator [facility] [phone]

## 2022-03-26 NOTE — Chronic Care Management (AMB) (Addendum)
Care Management    RN Visit Note  03/26/2022 Name: [name]. MRN: [id number] DOB: [birth year]  Subjective: [name]. is a 61 y.o. year old male who is a primary care patient of Colon Branch, MD. The care management team was consulted for assistance with disease management and care coordination needs.    Engaged with patient by telephone for follow up visit in response to provider referral for case management and/or care coordination services.   Consent to Services:   [name] was given information about Care Management services today including:  Care Management services includes personalized support from designated clinical staff supervised by his physician, including individualized plan of care and coordination with other care providers 24/7 contact phone numbers for assistance for urgent and routine care needs. The patient may stop case management services at any time by phone call to the office staff.  Patient agreed to services and consent obtained.   Assessment: Review of patient past medical history, allergies, medications, health status, including review of consultants reports, laboratory and other test data, was performed as part of comprehensive evaluation and provision of chronic care management services.   SDOH (Social Determinants of Health) assessments and interventions performed:    Care Plan  Allergies  Allergen Reactions   Metformin And Related Diarrhea and Other (See Comments)    Severe diarrhea   Wellbutrin [Bupropion] Nausea Only    Outpatient Encounter Medications as of 03/26/2022  Medication Sig   aspirin EC 81 MG EC tablet Take 1 tablet (81 mg total) by mouth daily with breakfast.   atorvastatin (LIPITOR) 80 MG tablet TAKE 1 TABLET EVERY DAY AT 6:00PM (Patient taking differently: Take 80 mg by mouth at bedtime.)   augmented betamethasone dipropionate (DIPROLENE-AF) 0.05 % cream APPLY TOPICALLY TWICE DAILY   Blood Glucose Monitoring  Suppl (ONETOUCH VERIO REFLECT) w/Device KIT Use to check blood glucose daily (DX: type 2 DM - E 11.51)   budesonide-formoterol (SYMBICORT) 160-4.5 MCG/ACT inhaler Inhale 2 puffs into the lungs 2 (two) times daily. (Patient taking differently: Inhale 1-2 puffs into the lungs See admin instructions. Inhale 1 puff into the lungs in the morning and 2 puffs in the evening)   Cholecalciferol (VITAMIN D-3) 125 MCG (5000 UT) TABS Take 5,000 Units by mouth daily.   clopidogrel (PLAVIX) 75 MG tablet TAKE 1 TABLET EVERY DAY   Cyanocobalamin (VITAMIN B-12) 5000 MCG SUBL Place 5,000 mcg under the tongue daily.   diclofenac Sodium (VOLTAREN) 1 % GEL Apply 1 application  topically daily as needed for pain.   empagliflozin (JARDIANCE) 10 MG TABS tablet Take 1 tablet (10 mg total) by mouth daily.   ezetimibe (ZETIA) 10 MG tablet Take 1 tablet (10 mg total) by mouth daily. (Patient taking differently: Take 10 mg by mouth every evening.)   fenofibrate (TRICOR) 145 MG tablet TAKE 1 TABLET EVERY DAY   folic acid (FOLVITE) 1 MG tablet Take 1 tablet (1 mg total) by mouth daily.   furosemide (LASIX) 20 MG tablet Take 3 tablets (60 mg total) by mouth daily.   gabapentin (NEURONTIN) 100 MG capsule Take 2 capsules (200 mg total) by mouth 3 (three) times daily. (Patient not taking: Reported on 03/17/2022)   glucose blood (ONETOUCH VERIO) test strip Use to check blood glucose once a day (DX: 11.51 - type 2 DM)   isosorbide mononitrate (IMDUR) 60 MG 24 hr tablet TAKE 1 AND 1/2 TABLETS EVERY DAY   nicotine (NICOTINE STEP 1) 21  mg/24hr patch Place 1 patch (21 mg total) onto the skin daily.   nitroGLYCERIN (NITROSTAT) 0.4 MG SL tablet Place 1 tablet (0.4 mg total) under the tongue every 5 (five) minutes x 3 doses as needed for chest pain.   OneTouch Delica Lancets 66M MISC Use to check blood glucose once a day (DX: E11.51 type 2 DM)   OXYGEN Inhale 2-4 L/min into the lungs as needed (for shortness of breath).   pantoprazole  (PROTONIX) 40 MG tablet TAKE 1 TABLET TWICE DAILY   PRESCRIPTION MEDICATION CPAP- At bedtime   spironolactone (ALDACTONE) 25 MG tablet TAKE 1/2 TABLET EVERY DAY   tamsulosin (FLOMAX) 0.4 MG CAPS capsule Take 1 capsule (0.4 mg total) by mouth in the morning and at bedtime.   thiamine 100 MG tablet Take 1 tablet (100 mg total) by mouth daily.   traZODone (DESYREL) 100 MG tablet Take 1 tablet (100 mg total) by mouth at bedtime.   valsartan (DIOVAN) 40 MG tablet Take 1 tablet (40 mg total) by mouth daily.   No facility-administered encounter medications on file as of 03/26/2022.    Patient Active Problem List   Diagnosis Date Noted   Class 1 obesity 12/25/2021   AKI (acute kidney injury) ([place]) 12/25/2021   Acute on chronic diastolic heart failure ([place]) 12/23/2021   Chronic respiratory failure with hypoxia ([place]) 12/23/2021   COPD exacerbation ([place]) 12/23/2021   Alcohol use 12/23/2021   (HFpEF) heart failure with preserved ejection fraction (HCC)    DOE (dyspnea on exertion) 11/07/2020   Type 2 diabetes mellitus with hyperlipidemia ([place]) 03/18/2020   Prolapsed lumbar disc 01/25/2020   Hiatal hernia    Hepatic steatosis    Hemianopia, homonymous, right    ETOH abuse    Esophagitis    Chronic diastolic CHF (congestive heart failure) (HCC)    Chronic bronchitis (HCC)    CAD (coronary artery disease)    PAD (peripheral artery disease) ([place]) 09/23/2016   Morbid obesity ([place])    Chronic pain syndrome 10/23/2015   GERD (gastroesophageal reflux disease) 08/07/2015   PCP NOTES >>>>> 06/01/2015   Acute ischemic stroke ([place]) 02/20/2015   BPH (benign prostatic hyperplasia) 02/20/2015   OSA (obstructive sleep apnea) ?? 05/05/2013   Erectile dysfunction 03/17/2012   Emphysema (subcutaneous) (surgical) resulting from a procedure 10/08/2008   Iron deficiency anemia 08/10/2008   ABNORMALITY OF GAIT 08/06/2008   Cigarette smoker 07/27/2008   Essential hypertension 07/26/2008   ST elevation  myocardial infarction involving left circumflex coronary artery ([place]) 07/26/2008    Conditions to be addressed/monitored: HTN, COPD, DMII, and GERD  Care Plan : RN Care Management Plan of Care  Updates made by Luretha Rued, RN since 03/26/2022 12:00 AM  Completed 03/26/2022   Problem: Chronic Disease Managment Education and/or Care Coordination needs Resolved 03/26/2022  Priority: High     Long-Range Goal: Development of plan of care for Chronic Disease Management and /or Care Coordination Needs Completed 03/26/2022  Start Date: 07/01/2021  Expected End Date: 02/13/2022  Priority: High  Note:   Resolved due to duplicate goals Current Barriers: reports has restarted smoking cessation efforts. He states he wants to do it his way. RNCM discussed multiple chronic conditions and patient states he is receptive to palliative care referral to assist him with his efforts in maintaining/improving his quality of life.  Knowledge Deficits related to plan of care for management of CHF, CAD, HTN, COPD, DMII, and GERD  Care Coordination needs related to health care management  of chronic conditions  Chronic Disease Management support and education needs related to CHF, CAD, HTN, COPD, DMII, and GERD  RNCM Clinical Goal(s):  Patient will verbalize basic understanding of CHF, CAD, HTN, COPD, DMII, and GERD disease process and self health management plan as evidenced by self report of taking and chart review of taking medications as prescribed, attending provider visits as scheduled demonstrate improved adherence to prescribed treatment plan for CHF, CAD, HTN, COPD, DMII, and GERD as evidenced by self report and/or chart notation of taking medications as precribed, calling provider with questions concerns, attending provider visits. work with pharmacist to address Medication procurement related to CHF, CAD, HTN, COPD, DMII, and GERD as evidenced by review of EMR and patient or pharmacist report    work with  community resource care guide to address needs related to Transportation and Limited access to food as evidenced by patient and/or community resource care guide support    through collaboration with Consulting civil engineer, provider, and care team. Transportation resources discussed with patient by care guide.   Interventions: 1:1 collaboration with primary care provider regarding development and update of comprehensive plan of care as evidenced by provider attestation and co-signature Inter-disciplinary care team collaboration (see longitudinal plan of care) Evaluation of current treatment plan related to  self management and patient's adherence to plan as established by provider Collaboration and Care Coordinating with embedded clinical pharmacist, Tammy Eckard regarding medications as needed   Maintain and/or Improve Quality of life: New goal. Discussed Palliative care services-Patient receptive to Palliative care referral-Informed Primary care provider Discussed importance of wearing CPAP machine-Provided contact number to [employer] [facility] regarding CPAP machine questions Encouraged patient to follow up with pulmonology if continues to have CPAP machine issues. Contact number provided Provided positive feedback regarding efforts toward smoking cessation Encouraged to continue to eat healthy Encouraged to continue to take medications as prescribed.  GERD Interventions:  Status goal met. Long Term Goal Reports taking medications as prescribed.  Evaluation of current treatment plan related to GERD, self-management and patient's adherence to plan as established by provider. Medications reviewed and encouraged to take as prescribed.  Encouraged to contact provider with any questions or concerns and as scheduled  Hypertension Interventions: Status Goal met. Long Term Goal Last practice recorded BP readings:  BP Readings from Last 3 Encounters:  02/18/22 121/69  02/11/22 123/73  01/13/22 132/80  Most  recent eGFR/CrCl:  Lab Results  Component Value Date   EGFR 78 02/05/2022    No components found for: CRCL  Evaluation of current treatment plan related to hypertension self management and patient's adherence to plan as established by provider Reviewed medications with patient and discussed importance of compliance Encouraged to attend provider visit as scheduled Readdressed smoking cessation Sent update to clinical pharmacist regarding discrepancy)  Diabetes Interventions: Status Goal Met. Long Term Goal Assessed patient's understanding of A1c goal: <7% Counseled on importance of regular laboratory monitoring as prescribed Lab Results  Component Value Date   HGBA1C 6.8 (H) 11/05/2021  Encouraged to continue to minimize concentrated sweets and refined highly processed foods Upcoming/scheduled appointments reviewed  COPD Interventions: Status Goal Met. Long Term Goal Encouraged to take as prescribed. Discussed smoking cessation and provided positive feedback Encouraged to attend provider visit as recommended  Heart Failure Interventions: Status: Goal met: Long Term Goal 03/26/22 Patient reports last weight was "220 something pounds". Denies any edema or increased SOB. Discussed the importance of keeping all appointments with provider Discussed the importance of daily weights  in HF management. Reiterated signs/symptoms of HF exacerbation and reinforced the importance of notifying cardiologist or PCP of any symptoms  CAD Interventions: Status: Goal Met: Long Term Goal Continues to smoke, but reports he is not smoking as much.  reviewed medications with patient and encouraged to take as prescribed Reviewed Importance of attending all scheduled provider appointments  Discussed smoking cessation-patient reports he is trying to decrease the number he smokes  Patient Goals/Self-Care Activities: [place] regarding questions about CPAP machine Contact Pulmonologist if you  continue to have issues with CPAP machine Contact provider with health questions, concerns or if you do not feel that you are improving follow rescue plan if symptoms flare-up know when to call the doctor weight gain of 3 pounds in a 24 hour period, increased swelling in stomach, feet/legs or hands. Increased shortness of breath, having less energy than normal, new or worsening dizziness, if it is harder for you to breathe when lying down and you need to sit up to breathe or you feel that something is not right.  Attend provider appointments as scheduled Continue to monitor for signs/symptoms of COPD worsening: increase cough, shortness of breath or mucus production change for 2 days or more, your number 1 priority is to call your doctor. Eat Healthy: variety of foods, fruits, vegetables, lean meats, healthy fats. Avoid processed foods, saturated transfats , minimize salt intake   Plan: Telephone follow up appointment with care management team member scheduled for:  04/29/22  Thea Silversmith, RN, MSN, BSN, CCM Care Management Coordinator [facility] [phone]   I have reviewed and agree with [facility] documentation.  [name], MD

## 2022-03-27 ENCOUNTER — Telehealth: Payer: Self-pay | Admitting: Adult Health

## 2022-03-27 ENCOUNTER — Other Ambulatory Visit: Payer: Self-pay

## 2022-03-27 DIAGNOSIS — I5032 Chronic diastolic (congestive) heart failure: Secondary | ICD-10-CM

## 2022-03-27 DIAGNOSIS — J449 Chronic obstructive pulmonary disease, unspecified: Secondary | ICD-10-CM

## 2022-03-27 DIAGNOSIS — E1140 Type 2 diabetes mellitus with diabetic neuropathy, unspecified: Secondary | ICD-10-CM

## 2022-03-27 NOTE — Telephone Encounter (Signed)
Called and left voicemail for patient that even though he is needing a new cpap machine he is needing to set up office visit with Dr Melvyn Novas or TP. We can not send in new cpap order since its been over a year since last seen in office. Nothing further needed

## 2022-03-28 ENCOUNTER — Other Ambulatory Visit: Payer: Self-pay | Admitting: Internal Medicine

## 2022-03-30 ENCOUNTER — Telehealth: Payer: Self-pay

## 2022-03-30 NOTE — Telephone Encounter (Signed)
Spoke with patient and scheduled a telephonic Palliative Consult for 04/06/22 @ 2;30 PM.  Consent obtained; updated Netsmart, Team List and Epic.

## 2022-04-06 ENCOUNTER — Other Ambulatory Visit: Payer: Medicaid Other | Admitting: Family Medicine

## 2022-04-06 ENCOUNTER — Ambulatory Visit: Payer: Medicare HMO | Admitting: Pharmacist

## 2022-04-06 DIAGNOSIS — I5032 Chronic diastolic (congestive) heart failure: Secondary | ICD-10-CM

## 2022-04-06 DIAGNOSIS — J449 Chronic obstructive pulmonary disease, unspecified: Secondary | ICD-10-CM

## 2022-04-06 DIAGNOSIS — F17200 Nicotine dependence, unspecified, uncomplicated: Secondary | ICD-10-CM

## 2022-04-06 DIAGNOSIS — J9611 Chronic respiratory failure with hypoxia: Secondary | ICD-10-CM

## 2022-04-06 DIAGNOSIS — E1140 Type 2 diabetes mellitus with diabetic neuropathy, unspecified: Secondary | ICD-10-CM

## 2022-04-06 DIAGNOSIS — Z515 Encounter for palliative care: Secondary | ICD-10-CM

## 2022-04-06 MED ORDER — DAPAGLIFLOZIN PROPANEDIOL 5 MG PO TABS: 5.0000 mg | ORAL_TABLET | Freq: Every day | ORAL | 0 refills | Status: DC

## 2022-04-06 NOTE — Chronic Care Management (AMB) (Signed)
//   Chronic Care Management Pharmacy Note  02/16/2022 Name:  [name]. MRN:  [id number] DOB:  1960-10-30  Summary:   CHF: patient restarted Jardiance but only has 4 days left. He reports edema and shortness of breath improved. He received LIS denial letter but has not brought into office yet. He will bring this week so that we can complete application for Jardiance medication assistance program. Patient [place] beach [place]. I don't have any Jardiance samples but I can change to Farxiga 105m - take 1 tablet daily in place of Jardiance. I provided 30 day free card. This should last until we can see if he will be approved for medication assistance program for Jardiance.  COPD / tobacco abuse: [name]. He reports his throat feels much better and he sounds better today. He is using nicotine 250mpatches and plans to start using nicotine lozenges as needed when he receives shipment from [facility]. He has decreased number of cigarettes smoked per day from 3PPD to 10 cigarettes per day. He was not able to tolerate bupropion or Chantix.   Noted that Palliative care met with patient today. We will assist patient as needed with medications.   Follow up: Patient has phone follow up with PCP 04/29/2022. Clinical Pharmacist Practitioner will follow up by phone in 2 to 4 weeks regarding medication assistance program.   Subjective: Kenneth Mozingois an [age].o. year old male who is a primary patient of [name], JoAlda BertholdMD.  The CCM team was consulted for assistance with disease management and care coordination needs.    Collaboration with patient by phone   for follow up visit in response to provider referral for pharmacy case management and/or care coordination services.   Consent to Services:  The patient was given information about Chronic Care Management services, agreed to services, and gave verbal consent prior to initiation of  services.  Please see initial visit note for detailed documentation.   Patient Care Team: PaColon BranchMD as PCP - General (Internal Medicine) McBurnell BlanksMD as PCP - Cardiology (Cardiology) HoRalene BatheMD as Consulting Physician (Ophthalmology) Pyrtle, JaLajuan LinesMD as Consulting Physician (Gastroenterology) BeSusa DayMD as Consulting Physician (Orthopedic Surgery) WaLuretha RuedRN as Case Manager EcCherre RobinsRPH-CPP (Pharmacist) NeRozetta NunneryMD (Inactive) as Consulting Physician (Otolaryngology)  Recent PCP office visits:  03/39/2023 - PCP (Dr PaLarose Kellsfollow up. Labs checked. No med changes.  Recent [facility] cons[facility]:  [facility]ort[facility] of lower [facility].  02/11/2022 - was suppose to have procedure but was rescheduled due to dealing with an emergency. Rescheduled for 02/18/2022 at 10:30am (arrival at 8:30a) 01/13/2022 - Cardio (Dr ArFletcher AnonF/U PAD and claudication. Ordered ABI and lower ext arterial duplex. F/U 6 months. no med changes noted.  Recent Hospital[facility]:  [facility]20[facility]  Medications started at discharge: Jardiance 1046maily  Medications stopped at discharge: none Medications changed at discharge: decreased valsartan dose to 58m49mily and changed furosemide to 20mg59make 3 tablets = 60mg 45m a day.  Objective:  Lab Results  Component Value Date   CREATININE 1.09 02/05/2022   CREATININE 1.39 (H) 12/26/2021   CREATININE 1.41 (H) 12/25/2021    Lab Results  Component Value Date   HGBA1C 6.8 (H) 11/05/2021   Last diabetic Eye exam:  Lab  Results  Component Value Date/Time   HMDIABEYEEXA No Retinopathy 06/30/2021 12:00 AM    Last diabetic Foot exam: No results found for: "HMDIABFOOTEX"      Component Value Date/Time   CHOL 141 05/07/2021 1347   CHOL 171 11/14/2019 1441   TRIG 142.0 05/07/2021 1347   HDL 41.70  05/07/2021 1347   HDL 37 (L) 11/14/2019 1441   CHOLHDL 3 05/07/2021 1347   VLDL 28.4 05/07/2021 1347   LDLCALC 71 05/07/2021 1347   LDLCALC 116 (H) 11/14/2019 1441   LDLDIRECT 116.0 05/12/2013 1517       Latest Ref Rng & Units 04/11/2021   10:56 AM 07/10/2020    2:45 PM 11/26/2018   12:22 PM  Hepatic Function  Total Protein 6.5 - 8.1 g/dL 7.9  7.1  7.8   Albumin 3.5 - 5.0 g/dL 4.3  4.0  3.9   AST 15 - 41 U/L '18  12  22   ' ALT 0 - 44 U/L '15  13  19   ' Alk Phosphatase 38 - 126 U/L 66  135  88   Total Bilirubin 0.3 - 1.2 mg/dL 1.5  1.2  2.1     Lab Results  Component Value Date/Time   TSH 1.895 07/07/2016 06:31 PM   TSH 1.88 05/05/2013 12:14 PM   TSH 2.290 **Test methodology is 3rd generation TSH** 06/23/2009 07:55 AM       Latest Ref Rng & Units 02/05/2022    2:47 PM 12/24/2021    2:04 AM 12/23/2021   12:49 PM  CBC  WBC 3.4 - 10.8 x10E3/uL 7.8  9.0  7.1   Hemoglobin 13.0 - 17.7 g/dL 15.4  14.8  15.0   Hematocrit 37.5 - 51.0 % 45.2  45.2  46.3   Platelets 150 - 450 x10E3/uL 206  195  216     Lab Results  Component Value Date/Time   VD25OH 39.28 11/05/2021 04:04 PM   VD25OH 10.8 (L) 11/03/2016 02:53 PM    Clinical ASCVD: Yes  The ASCVD Risk score (Arnett DK, et al., 2019) failed to calculate for the following reasons:   The patient has a prior MI or stroke diagnosis     Social History   Tobacco Use  [name] Status Every Day   Packs/day: 0.50   Years: 45.00   Total pack years: 22.50   Types: Cigarettes   Start date: 1976   Passive exposure: Current  Smokeless Tobacco Never  Tobacco Comments   1 pack daily- wants to quit   BP Readings from Last 3 Encounters:  02/18/22 121/69  02/11/22 123/73  01/13/22 132/80   Pulse Readings from Last 3 Encounters:  02/18/22 68  02/11/22 65  01/13/22 73   Wt Readings from Last 3 Encounters:  02/18/22 235 lb (106.6 kg)  02/11/22 235 lb (106.6 kg)  01/13/22 234 lb (106.1 kg)    Assessment: Review of patient past  medical history, allergies, medications, health status, including review of consultants reports, laboratory and other test data, was performed as part of comprehensive evaluation and provision of chronic care management services.   SDOH:  (Social Determinants of Health) assessments and interventions performed:         CCM Care Plan  Allergies  Allergen Reactions   Metformin And Related Diarrhea and Other (See Comments)    Severe diarrhea   Wellbutrin [Bupropion] Nausea Only    Medications Reviewed Today     Reviewed by Cherre Robins, RPH-CPP (Pharmacist) on 04/06/22 at 1624  Med List Status: <None>   Medication Order Taking? Sig Documenting Provider Last Dose Status Informant  aspirin EC 81 MG EC tablet [id number] Yes Take 1 tablet (81 mg total) by mouth daily with breakfast. Roxan Hockey, MD Taking Active Multiple Informants  atorvastatin (LIPITOR) 80 MG tablet [id number] Yes TAKE 1 TABLET EVERY DAY AT 6:00PM  Patient taking differently: Take 80 mg by mouth at bedtime.   Burnell Blanks, MD Taking Active Multiple Informants  augmented betamethasone dipropionate (DIPROLENE-AF) 0.05 % cream [id number] Yes Staves, MD Taking Active Multiple Informants  Blood Glucose Monitoring Suppl (ONETOUCH VERIO REFLECT) w/Device KIT [id number] No Use to check blood glucose daily (DX: type 2 DM - E 11.51)  Patient not taking: Reported on 04/06/2022   Colon Branch, MD Not Taking Active Multiple Informants  budesonide-formoterol [facility]) 160-4.5 MCG/ACT inhaler [id number] Yes Inhale 2 puffs into the lungs 2 (two) times daily.  Patient taking differently: Inhale 1-2 puffs into the lungs See admin instructions. Using as needed.   Colon Branch, MD Taking Active Multiple Informants  Cholecalciferol (VITAMIN D-3) 125 MCG (5000 UT) TABS [id number] Yes Take 5,000 Units by mouth daily. [provider] Taking Active Multiple Informants  clopidogrel (PLAVIX) 75  MG tablet [id number] Yes TAKE 1 TABLET EVERY DAY Burnell Blanks, MD Taking Active Multiple Informants  Cyanocobalamin (VITAMIN B-12) 5000 MCG SUBL [id number] Yes Place 5,000 mcg under the tongue daily. [provider] Taking Active Multiple Informants  diclofenac Sodium (VOLTAREN) 1 % GEL [id number] Yes Apply 1 application  topically daily as needed for pain. [provider] Taking Active Multiple Informants  empagliflozin (JARDIANCE) 10 MG TABS tablet [id number] No Take 1 tablet (10 mg total) by mouth daily.  Patient not taking: Reported on 04/06/2022   Colon Branch, MD Not Taking Active   ezetimibe (ZETIA) 10 MG tablet [id number] Yes Take 1 tablet (10 mg total) by mouth daily. Burnell Blanks, MD Taking Active Multiple Informants  fenofibrate (TRICOR) 145 MG tablet [id number] Yes TAKE 1 TABLET EVERY DAY Colon Branch, MD Taking Active Multiple Informants  folic acid (FOLVITE) 1 MG tablet [id number]  Take 1 tablet (1 mg total) by mouth daily. Roxan Hockey, MD  Active Multiple Informants  furosemide (LASIX) 20 MG tablet [id number] Yes Take 3 tablets (60 mg total) by mouth daily. Arrien, Jimmy Picket, MD Taking Active Multiple Informants  gabapentin (NEURONTIN) 100 MG capsule [id number] No Take 2 capsules (200 mg total) by mouth 3 (three) times daily.  Patient not taking: Reported on 04/06/2022   Colon Branch, MD Not Taking Consider Medication Status and Discontinue Multiple Informants           Med Note Dessie Coma   Tue Dec 23, 2021  8:41 PM)    glucose blood [facility] VERIO) test strip [id number] No Use to check blood glucose once a day (DX: 11.51 - type 2 DM)  Patient not taking: Reported on 04/06/2022   Colon Branch, MD Not Taking Active Multiple Informants  isosorbide mononitrate (IMDUR) 60 MG 24 hr tablet [id number] Yes TAKE 1 AND 1/2 TABLETS EVERY DAY Burnell Blanks, MD Taking Active Multiple Informants  nicotine (NICOTINE STEP 1) 21 mg/24hr patch  [id number] Yes Place 1 patch (21 mg total) onto the skin daily. Colon Branch, MD Taking Active   nitroGLYCERIN (NITROSTAT) 0.4 MG SL tablet [id number]  Place 1 tablet (0.4 mg total) under the tongue every 5 (five) minutes x  3 doses as needed for chest pain. Colon Branch, MD  Active Multiple Informants           Med Note Paris Lore   Tue Dec 23, 2021  2:21 PM)    OneTouch Delica Lancets 95K MISC [id number] No Use to check blood glucose once a day (DX: E11.51 type 2 DM)  Patient not taking: Reported on 04/06/2022   Colon Branch, MD Not Taking Active Multiple Informants  OXYGEN [id number] Yes Inhale 2-4 L/min into the lungs as needed (for shortness of breath). [provider] Taking Active Multiple Informants  pantoprazole (PROTONIX) 40 MG tablet [id number] Yes TAKE 1 TABLET TWICE DAILY Burnell Blanks, MD Taking Active Multiple Informants  PRESCRIPTION MEDICATION [id number] No CPAP- At bedtime  Patient not taking: Reported on 04/06/2022   [provider] Not Taking Active Multiple Informants  spironolactone (ALDACTONE) 25 MG tablet [id number] Yes TAKE 1/2 TABLET EVERY DAY Burnell Blanks, MD Taking Active Multiple Informants  tamsulosin (FLOMAX) 0.4 MG CAPS capsule [id number] Yes TAKE 1 CAPSULE BY MOUTH IN THE MORNING AND AT BEDTIME. Dutch Quint B, FNP Taking Active   thiamine 100 MG tablet [id number] Yes Take 1 tablet (100 mg total) by mouth daily. Roxan Hockey, MD Taking Active Multiple Informants  traZODone (DESYREL) 100 MG tablet [id number] Yes Take 1 tablet (100 mg total) by mouth at bedtime. Colon Branch, MD Taking Active Multiple Informants  valsartan (DIOVAN) 40 MG tablet [id number] Yes Take 1 tablet (40 mg total) by mouth daily. Colon Branch, MD Taking Active             Patient Active Problem List   Diagnosis Date Noted   Class 1 obesity 12/25/2021   AKI (acute kidney injury) ([place]) 12/25/2021   Acute on chronic diastolic heart failure ([place])  12/23/2021   Chronic respiratory failure with hypoxia ([place]) 12/23/2021   COPD exacerbation ([place]) 12/23/2021   Alcohol use 12/23/2021   (HFpEF) heart failure with preserved ejection fraction (HCC)    DOE (dyspnea on exertion) 11/07/2020   Type 2 diabetes mellitus with hyperlipidemia ([place]) 03/18/2020   Prolapsed lumbar disc 01/25/2020   Hiatal hernia    Hepatic steatosis    Hemianopia, homonymous, right    ETOH abuse    Esophagitis    Chronic diastolic CHF (congestive heart failure) (HCC)    Chronic bronchitis (HCC)    CAD (coronary artery disease)    PAD (peripheral artery disease) ([place]) 09/23/2016   Morbid obesity ([place])    Chronic pain syndrome 10/23/2015   GERD (gastroesophageal reflux disease) 08/07/2015   PCP NOTES >>>>> 06/01/2015   Acute ischemic stroke ([place]) 02/20/2015   BPH (benign prostatic hyperplasia) 02/20/2015   OSA (obstructive sleep apnea) ?? 05/05/2013   Erectile dysfunction 03/17/2012   Emphysema (subcutaneous) (surgical) resulting from a procedure 10/08/2008   Iron deficiency anemia 08/10/2008   ABNORMALITY OF GAIT 08/06/2008   Cigarette smoker 07/27/2008   Essential hypertension 07/26/2008   ST elevation myocardial infarction involving left circumflex coronary artery ([place]) 07/26/2008    Immunization History  Administered Date(s) Administered   PFIZER(Purple Top)SARS-COV-2 Vaccination 11/17/2019, 12/11/2019, 07/18/2020   Pneumococcal Polysaccharide-23 11/03/2016   Tdap 11/03/2016    Conditions to be addressed/monitored: CHF, CAD, HTN, HLD, Hypertriglyceridemia, COPD, DMII, and BPH; OSA; GERD; fatty liver; h/o strokePAD; sleep disturbance; anxiety/ depression  Care Plan : Pharmacy Care Plan  Updates made by Cherre Robins, RPH-CPP since 04/06/2022 12:00 AM     Problem: HTN; CHF: CAD;  GERD with esophagitis; depression; BPH; COPD; bronchitis; stroke; elevated TG and hyperlipidemia; type 2 DM with h/o pancreatitis   Priority: High  Onset Date: 03/18/2021      Long-Range Goal: Management of chronic conditions and medications   Start Date: 03/18/2021  Recent Progress: On track  Priority: High  Note:   Current Barriers:  Unable to independently monitor therapeutic efficacy Unable to achieve control of Hyperlipidemia  Unable to maintain control of HTN, CHF Does not adhere to prescribed medication regimen Does not contact provider office for questions/concerns  Pharmacist Clinical Goal(s):  Over the next 90 days, patient will verbalize ability to afford treatment regimen achieve adherence to monitoring guidelines and medication adherence to achieve therapeutic efficacy achieve control of Hyperlipidemia as evidenced by LDL <70 maintain control of CHF, HTN and type 2 DM as evidenced by attaining goals listed below  adhere to prescribed medication regimen as evidenced by refill history contact provider office for questions/concerns as evidenced notation of same in electronic health record through collaboration with PharmD and provider.   Interventions: 1:1 collaboration with Colon Branch, MD regarding development and update of comprehensive plan of care as evidenced by provider attestation and co-signature Inter-disciplinary care team collaboration (see longitudinal plan of care) Comprehensive medication review performed; medication list updated in electronic medical record  Diabetes: Not at goal;  Goal A1c < 6.5% Lab Results  Component Value Date   HGBA1C 6.8 (H) 11/05/2021  Current treatment:   Jardiance 69m daily  Tried metformin in past but stopped due to GI side effects (tried 5069mday and 25019may) History of pancreatitis (possibly related to alcohol use)   Current glucose readings: does not currently check BG at home Denies hypoglycemic/hyperglycemic symptoms Current meal patterns: over the last 2 to 3 weeks has been limiting bread and sweets Current exercise: none currently Interventions:  Counseled on A1c and BG goals ,   Awaiting decision on Medicare Extra Help - if approved Jardiance copay will drop to about $10 per 30 days; If denied can apply for medication assistance program for Jardiance. Patient to pick up sample of Jardiance from cardio office 03/19/2022 (he has 4 tablets left at home) Continue to limit serving sizes and intake of breads and sweets Will continue to gather information to apply for medication assistance program for Jardiance. Patient to bring in Medicare Extra Help denial letter.  Temporarily will provided Rx for Farxiga 5mg2md 30 day free coupon until we can submit medication assistance program application for Jardiance.    Hypertension / CHF: Improving; goal: decrease symptoms of CHF, prevent exacerbations and BP <140/90.  Current treatment: Furosemide 20mg81make 3 tablets = 60mg 44m morning Spironolactone 25mg -33me 0.5 tablet = 12.5mg eac5morning Valsartan 40mg dai84mardiance 10mg dail66mas scales but has not been checking weight at home. EF = 55-60% (12/24/2021) Past medications tried: lisinopril 10mg - cha35mto valsartan by pulmonologist; metoprolol - stopped due to low BP Denies hypotensive/hypertensive symptoms Recent home blood pressure: 134/64; 123/71  02/02/2022 was slightly elevated at 147/69 HR 60 - home blood pressure from visit with Juana WallaThea Silversmithe Case Manager.  Interventions:  Reviewed blood pressure goals Reminded patient to rest / sit for at least 5 minutes prior to checking blood pressure.  Discussed signs and symptoms of CHF exacerbation - weight gain, SOB, abdominal fullness, swelling in legs or abdomen, Fatigue and weakness, changes in ability to perform usual activities, persistent cough or wheezing with white or pink blood-tinged  mucus, nausea and lack of appetite Restart weighing daily - report weight gain of more than 3 lbs in 24 hours or 5 lbs in 1 week.   Hyperlipidemia / CAD / history of stroke / PAD: Uncontrolled but improving; LDL  goal <55 (per cardio) ; Tg goal <150 Last LDL decreased from 116 to 71 (05/07/2021)  Current treatment: Atorvastatin 85m daily Ezetimibe 1103mdaily  Fenofibrate 14533maily Aspirin 5m75mily with breakfast  Clopidogrel 75mg30mly Isosorbide ER 60mg 7mke 1.5 tablets = 90mg d9m Nitroglycerin 0.4mg - p65me 1 tablet under the tongue and allow to dissolve as needed for chest pain Medications previously tried: Repatha - stopped due to cost; Brilenta  - stopped due to shortness of breath Patient reports he has restarted aspirin. He has not had a nosebleed in the last 2 weeks Interventions:  Continue aspirin 5mg dai68m Follow up as planned with Dr Arida  CoFletcher Anone current regimen for lipid lowering. Recheck lipids at next appointment with PCP.  Chronic Obstructive Pulmonary Disease / OSA: Controlled Current treatment: CPAP Symbicort (budesonide/formoterol) 160/4.5 - inhale 2 puffs into lungs twice a day Managed by pulmonologist - Dr Wert PFT'Melvyn Novas/22/2022  FEV1 1.93 (53 % ) ratio 0.70  p 13 % improvement from saba p 0 prior to study with   FV curve no signficant curvature Previous medications tried: Spiriva - not effective Interventions: (addressed at previous visit) Continue to follow up with pulmonologist  Discussed that Symbicort was for healing his lungs / for maintenance.  Encouraged him to take 2 puffs twice a day EVERY day Reminded patient to rinse mouth after using Symbicort.   Tobacco Use disorder:  Patient estarted [name] after quitting earlier in 2023. He has recommitted to quitting and has decreased number of cigarettes per day he is [name] from 3 PPD to 5 cigarettes per day. Current therapy:  Nicotine patch 21mg dail70mcotine lozenges.  Past therapies tried: Chantix (not helpful); Cold turkey - r[place]d; bupropion tried on 2 separate occasions- caused nausea  Interventions:  Encouraged patient to decreased number of cigarettes smoked per day.  He was provided with  [employer] number and has contacted them.(Addressed at previous visit) Could consider nortriptyline if needed in future as there has been some success for [name] cessation with nortriptyline. Could also help with sleep.   BPH / Urinary incontinence:  had episode of incontinence in a store recently.  He last saw urologist 08/2021. At that time patient reported that tamsulosin 0.4mg bid wa37morking well. Patient is convinced that furosemide is cause of incontinence and while it can worsen urinary frequency, he is on a low dose of furosemide 20mg daily 71mes 40mg if need66m Current therapy:  Tamsulosin 0.4mg twice a d69mInterventions: (addressed at previous visit)   Recommended he discuss furosemide with cardiologist, Dr McAlhany and uAngelena Formncontinence with urologist, Dr Newsome.   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 Sometimes has difficulty reading labels on his prescription bottles.  His sister, Christy is helAlyse Lowtient with medications as she is able (she [place] in [place] able [place] frequently)   Interventions:   Reviewed refill history   Patient Goals/Self-Care Activities Over the next 90 days, patient will:  take medications as prescribed,  focus on medication adherence by utilizing mail  order pharmacy and contacting clinical pharmacist [name] [place] [phone] or [phone]) about any medications questions or concerns,  check blood pressure 2 to 3 times per week, document, and provide at future appointments,  weigh daily, and contact provider if weight gain of more than 3 lbs in 24 hours or 5 lbs in 1 week  Use Faxiga 75m daily in place of Jardiance until we can get approval for medication assistance program. I have sent in a new prescription for  Farxiga with a 30 days free coupon to [employer]    Follow Up Plan: Telephone follow up appointment with care management team member scheduled for:  2 to 4 weeks.             Medication Assistance:     Applied for LIS / extra help in 02/2022 - denied. Need patient to provide a copy of letter so we can apply for medication assistance program for Jardiance.   Patient's preferred pharmacy is:  [facility] [place]Phone: [phone]: [phone] [facility] [address]Phone: [phone]: [phone] [place]Phone: [phone]: [phone]  Uses pill box? No - patient has declined to use pill container but he is starting to use stickers on bottles to help identify time of day to take each medication  Follow Up:  Patient agrees to Care Plan and Follow-up.  Plan: follow up phone call with clinical pharmacist 2 to 4 weeks  TCherre Robins PharmD Clinical Pharmacist [facility]New WindsorMLong GroveHCollege [facility]

## 2022-04-06 NOTE — Patient Instructions (Addendum)
Mr. Frerking It was a pleasure speaking with you today.  Below is a summary of your health goals and summary of our recent visit. You can also view your updated Chronic Care Management Care plan through your MyChart account.    Patient Goals/Self-Care Activities take medications as prescribed,  focus on medication adherence by utilizing mail order pharmacy and contacting clinical pharmacist [name] [place] [phone] or [phone]) about any medications questions or concerns,  check blood pressure 2 to 3 times per week, document, and provide at future appointments,  weigh daily, and contact provider if weight gain of more than 3 lbs in 24 hours or 5 lbs in 1 week  Use Faxiga '5mg'$  daily in place of Jardiance until we can get approval for medication assistance program. I have sent in a new prescription for Farxiga with a 30 days free coupon to [employer].     As always if you have any questions or concerns especially regarding medications, please feel free to contact me either at the phone number below or with a MyChart message.   Keep up the good work!  Cherre Robins, PharmD Clinical Pharmacist [place] [phone] (direct line)  [phone] (main office number)   Patient verbalizes understanding of instructions and care plan provided today and agrees to view in [place]. Active MyChart status and patient understanding of how to access instructions and care plan via MyChart confirmed with patient.

## 2022-04-06 NOTE — Progress Notes (Signed)
[facility] Palliative Care Consult Note Telephone: [phone]  Fax: [phone]   Date of encounter: 04/06/22 2:40 PM PATIENT NAME: [name]. [address] [place] [address]   [phone] (home)  DOB: 1961/05/09 MRN: [id number] PRIMARY CARE PROVIDER:    Colon Branch, MD,  [address] [place] [address] [phone]  REFERRING PROVIDER:   Colon Branch, [place] [address] [place],  [place] [address] [phone]  RESPONSIBLE PARTY:    Contact Information     Name Relation Home Work Mobile   Rierson,Christy Sister   [phone]   Owen, Pratte [phone]  [phone]   Marlana Salvage Daughter   [phone]   McGill,Heather Relative   [phone]      I connected with  [name]. On 04/06/2022 by a telephonic application as pt was unable to use video and verified that I am speaking with the correct person using two identifiers.   I discussed the limitations of evaluation and management by telemedicine. The patient expressed understanding and agreed to proceed.   Palliative Care was asked to follow this patient by consultation request of  Colon Branch, MD to address advance care planning and complex medical decision making. This is the initial visit.          ASSESSMENT, SYMPTOM MANAGEMENT AND PLAN / RECOMMENDATIONS:   Chronic heart failure with preserved EF/Chronic respiratory failure due to COPD Encourage abstinence from all substances including tobacco, caffeine, alcohol and illicit drugs. Continues on Lipitor, ASA, diuretic therapy (both Lasix and Aldactone), farxiga (until Jardiance approved), Imdur 60 mg in am/ 30 mg QHS, ARB therapy with Valsartan Maintains O2 '@2' -4 L and Symbicort inhaler therapy.  Palliative Care Encounter Continue to follow to discuss goals of care   Follow up Palliative Care Visit: Palliative care will continue to follow for complex medical  decision making, advance care planning, and clarification of goals. Return 4 weeks or prn.    This visit was coded based on medical decision making (MDM).  PPS: 60%  HOSPICE ELIGIBILITY/DIAGNOSIS: TBD  Chief Complaint:  [name] received a referral to follow up with patient for chronic disease management in setting of chronic heart and respiratory failure.  Palliative Care is following for advance directive planning and defining/refining goals of care.  HISTORY OF PRESENT ILLNESS:  Kenneth Mach. is a 61 y.o. year old male with chronic heart and respiratory failure secondary to CAD/MI, drug use and COPD. He has chronic diastolic heart failure with preserved EF, HTN, OSA, hiatal hernia,  GERD, DM and hepatic steatosis, prolapsed lumbar disc, morbid obesity, homonymous hemianopsia secondary to prior stroke, continues to smoke and use alcohol.  Has hx of cocaine and marijuana use. He has ischemic vascular disease (PAD) affecting the right lower extremity which was stented. Sister is  helping with medicines and decision making.  Can't talk well.  Denies pain, nausea or vomiting.  No SOB above baseline.  No edema except from time to time.  No falls lately.  Had operation to place stent in right LE and has not fallen since then. Denies dysuria, LUTS. No trouble with constipation.  Appetite is pretty good.  Continent of bowel and intermittent incontinence of bladder.  Bathing and dressing himself, lives alone. Congestion and ambulation due to numbness right leg.  Depressed mood but doing ok because he can't do anything as far as working or a lot  for himself.  Has had numerous heart attacks and open heart surgery, has had 3 strokes. Not taking Gabapentin since having the operation.  Numbness improved in right leg since surgery.  No CP or need for NTG.  HGB A1c was 6.8% on 11/05/21, does not check blood sugars.  Has feeling of "being sick" and lots of pressure/tightness on  chest, nausea.   History obtained from review of EMR and interview with [name].   03/19/22 Vascular US  RLE arterial duplex Summary:  Right: 30-49% stenosis noted in the superficial femoral artery. Moderate improvement is noted compared to previous study. Moderate diffuse atherosclerosis noted throughout extremity. At the areas of shadowing plaque high grade stenosis or occlusion can not be excluded.  Suggest follow up study in 6 months.   03/19/22  Vascular US with/wo TBI Right ABIs and TBIs appear increased compared to prior study on 01/27/2022  Left ABIs and TBIs appear decreased compared to prior study on 01/27/2022.     Summary:  Right: Resting right ankle-brachial index indicates mild right lower  extremity arterial disease. The right toe-brachial index is abnormal.   Left: Resting left ankle-brachial index indicates mild left lower  extremity arterial disease. The left toe-brachial index is abnormal.     Suggest follow up study in 6 months.   02/18/22 Abdominal Aortogram with Peripheral Vascular Atherectomy Conclusion  1.  Small aortic aneurysm above the iliac bifurcation.  Mild to moderate diffuse iliac disease. 2.  Right lower extremity: Moderate diffuse SFA disease followed by severe stenosis in the midsegment.  Three-vessel runoff below the knee. 3.  Successful directional atherectomy and drug-coated balloon angioplasty to the right SFA.   Recommendations: Continue dual antiplatelet therapy Aggressive treatment of risk factors and smoking cessation.  12/24/21 2d echo: FINDINGS   Left Ventricle EF, 55 to 60%. The left ventricle has normal function and no regional wall motion abnormalities. Mild left ventricular hypertrophy. Left ventricular  diastolic parameters are consistent with Grade II diastolic dysfunction (pseudonormalization). Right Ventricle: Right ventricle normal size and systolic function.  Moderately elevated pulmonary artery systolic  Pressure with an  estimated right ventricular systolic pressure is 27.2 mmHg.  Bi-atrial moderate dilatation.  Pericardium: There is no evidence of pericardial effusion.   Mitral Valve: The mitral valve is grossly normal. Mild to moderate mitral  annular calcification. Trivial mitral valve regurgitation. No evidence of  mitral valve stenosis.   Tricuspid Valve: The tricuspid valve is normal in structure. Tricuspid  valve regurgitation is mild . No evidence of tricuspid stenosis.   Aortic Valve: Aortic valve sclerosis/calcification is present, without any evidence of aortic stenosis.    Aorta: The aortic root, ascending aorta and aortic arch are all  structurally normal, with no evidence of dilitation or obstruction.   11/05/21 Last HGB A1c 6.8%.  I reviewed available labs, medications, imaging, studies and related documents from the EMR.  Records reviewed and summarized above.   ROS General: NAD EYES: denies vision changes ENMT: endorses some dysphagia and difficulty with speech Cardiovascular: endorses chest pain and DOE Pulmonary: endorses cough, endorses no increased SOB above baseline Abdomen: endorses good appetite, denies constipation, endorses continence of bowel GU: denies dysuria, endorses  intermittent incontinence of urine primarily due to diuretic therapy and not being able to get to the bathroom quickly MSK:  denies increased weakness, no falls reported Skin: denies rashes or wounds Neurological: denies pain, denies insomnia Psych: Endorses depressed  mood Heme/lymph/immuno: denies bruises, abnormal bleeding  Physical Exam: Limited to  audible only Current and past weights: Last weight 235 lbs on 02/18/22 Constitutional: NAD CV: voice soft, unable to speak more than 4-5 words at a time without stopping to take a breath Pulmonary: no audible wheezing or wet quality to audible breathing Neuro:  no generalized weakness, no cognitive impairment Psych:  monotone, flat quality to speech, A  and O x 3   CURRENT PROBLEM LIST:  Patient Active Problem List   Diagnosis Date Noted   Class 1 obesity 12/25/2021   AKI (acute kidney injury) ([place]) 12/25/2021   Acute on chronic diastolic heart failure ([place]) 12/23/2021   Chronic respiratory failure with hypoxia ([place]) 12/23/2021   COPD exacerbation ([place]) 12/23/2021   Alcohol use 12/23/2021   (HFpEF) heart failure with preserved ejection fraction (HCC)    DOE (dyspnea on exertion) 11/07/2020   Type 2 diabetes mellitus with hyperlipidemia ([place]) 03/18/2020   Prolapsed lumbar disc 01/25/2020   Hiatal hernia    Hepatic steatosis    Hemianopia, homonymous, right    ETOH abuse    Esophagitis    Chronic diastolic CHF (congestive heart failure) (HCC)    Chronic bronchitis (HCC)    CAD (coronary artery disease)    PAD (peripheral artery disease) ([place]) 09/23/2016   Morbid obesity ([place])    Chronic pain syndrome 10/23/2015   GERD (gastroesophageal reflux disease) 08/07/2015   PCP NOTES >>>>> 06/01/2015   Acute ischemic stroke ([place]) 02/20/2015   BPH (benign prostatic hyperplasia) 02/20/2015   OSA (obstructive sleep apnea) ?? 05/05/2013   Erectile dysfunction 03/17/2012   Emphysema (subcutaneous) (surgical) resulting from a procedure 10/08/2008   Iron deficiency anemia 08/10/2008   ABNORMALITY OF GAIT 08/06/2008   Cigarette smoker 07/27/2008   Essential hypertension 07/26/2008   ST elevation myocardial infarction involving left circumflex coronary artery ([place]) 07/26/2008   PAST MEDICAL HISTORY:  Active Ambulatory Problems    Diagnosis Date Noted   Cigarette smoker 07/27/2008   Essential hypertension 07/26/2008   ST elevation myocardial infarction involving left circumflex coronary artery ([place]) 07/26/2008   ABNORMALITY OF GAIT 08/06/2008   Erectile dysfunction 03/17/2012   OSA (obstructive sleep apnea) ?? 05/05/2013   Acute ischemic stroke ([place]) 02/20/2015   BPH (benign prostatic hyperplasia) 02/20/2015   PCP NOTES >>>>> 06/01/2015    GERD (gastroesophageal reflux disease) 08/07/2015   Morbid obesity ([place])    PAD (peripheral artery disease) ([place]) 09/23/2016   Iron deficiency anemia 08/10/2008   Hiatal hernia    Hepatic steatosis    Hemianopia, homonymous, right    ETOH abuse    Esophagitis    Emphysema (subcutaneous) (surgical) resulting from a procedure 10/08/2008   Chronic diastolic CHF (congestive heart failure) (HCC)    Chronic bronchitis (HCC)    CAD (coronary artery disease)    Chronic pain syndrome 10/23/2015   Prolapsed lumbar disc 01/25/2020   Type 2 diabetes mellitus with hyperlipidemia ([place]) 03/18/2020   DOE (dyspnea on exertion) 11/07/2020   (HFpEF) heart failure with preserved ejection fraction (HCC)    Acute on chronic diastolic heart failure ([place]) 12/23/2021   Chronic respiratory failure with hypoxia ([place]) 12/23/2021   COPD exacerbation ([place]) 12/23/2021   Alcohol use 12/23/2021   Class 1 obesity 12/25/2021   AKI (acute kidney injury) ([place]) 12/25/2021   Resolved Ambulatory Problems    Diagnosis Date Noted   CAD, stents, subsequent CABG 08/10/2000   Noncompliance 05/05/2013   Annual physical exam 05/31/2015   Pancreatitis, acute    STEMI involving left circumflex coronary artery ([place]) 07/07/2016  Closed fracture of lateral malleolus 01/25/2020   Past Medical History:  Diagnosis Date   Anxiety    Cocaine use    Colon polyps    Depression    History of nuclear stress test 08/2017   Hyperlipidemia    Hypertension    Myocardial infarction [facility]) "several"   Pancreatitis 06/2015   Sleep apnea    Stroke ([place]) 02/2015   Tobacco abuse    Tubular adenoma of colon    SOCIAL HX:  Social History   Tobacco Use   Smoking status: Every Day    Packs/day: 0.50    Years: 45.00    Total pack years: 22.50    Types: Cigarettes    Start date: 1976    Passive exposure: Current   Smokeless tobacco: Never   Tobacco comments:    1 pack daily- wants to quit  Substance Use Topics   Alcohol use:  Yes    Alcohol/week: 3.0 standard drinks of alcohol    Types: 3 Cans of beer per week    Comment: 3 beers per day   FAMILY HX:  Family History  Problem Relation Age of Onset   Heart disease Mother    Heart failure Mother    Diabetes Mother    Colon cancer Father        late 53s   Prostate cancer Neg Hx        Preferred Pharmacy: ALLERGIES:  Allergies  Allergen Reactions   Metformin And Related Diarrhea and Other (See Comments)    Severe diarrhea   Wellbutrin [Bupropion] Nausea Only     PERTINENT MEDICATIONS:  Outpatient Encounter Medications as of 04/06/2022  Medication Sig   aspirin EC 81 MG EC tablet Take 1 tablet (81 mg total) by mouth daily with breakfast.   atorvastatin (LIPITOR) 80 MG tablet TAKE 1 TABLET EVERY DAY AT 6:00PM (Patient taking differently: Take 80 mg by mouth at bedtime.)   augmented betamethasone dipropionate (DIPROLENE-AF) 0.05 % cream APPLY TOPICALLY TWICE DAILY   Blood Glucose Monitoring Suppl (ONETOUCH VERIO REFLECT) w/Device KIT Use to check blood glucose daily (DX: type 2 DM - E 11.51)   budesonide-formoterol (SYMBICORT) 160-4.5 MCG/ACT inhaler Inhale 2 puffs into the lungs 2 (two) times daily. (Patient taking differently: Inhale 1-2 puffs into the lungs See admin instructions. Inhale 1 puff into the lungs in the morning and 2 puffs in the evening)   Cholecalciferol (VITAMIN D-3) 125 MCG (5000 UT) TABS Take 5,000 Units by mouth daily.   clopidogrel (PLAVIX) 75 MG tablet TAKE 1 TABLET EVERY DAY   Cyanocobalamin (VITAMIN B-12) 5000 MCG SUBL Place 5,000 mcg under the tongue daily.   diclofenac Sodium (VOLTAREN) 1 % GEL Apply 1 application  topically daily as needed for pain.   empagliflozin (JARDIANCE) 10 MG TABS tablet Take 1 tablet (10 mg total) by mouth daily.   ezetimibe (ZETIA) 10 MG tablet Take 1 tablet (10 mg total) by mouth daily. (Patient taking differently: Take 10 mg by mouth every evening.)   fenofibrate (TRICOR) 145 MG tablet TAKE 1 TABLET  EVERY DAY   folic acid (FOLVITE) 1 MG tablet Take 1 tablet (1 mg total) by mouth daily.   furosemide (LASIX) 20 MG tablet Take 3 tablets (60 mg total) by mouth daily.   gabapentin (NEURONTIN) 100 MG capsule Take 2 capsules (200 mg total) by mouth 3 (three) times daily. (Patient not taking: Reported on 03/17/2022)   glucose blood (ONETOUCH VERIO) test strip Use to check blood glucose  once a day (DX: 11.51 - type 2 DM)   isosorbide mononitrate (IMDUR) 60 MG 24 hr tablet TAKE 1 AND 1/2 TABLETS EVERY DAY   nicotine (NICOTINE STEP 1) 21 mg/24hr patch Place 1 patch (21 mg total) onto the skin daily.   nitroGLYCERIN (NITROSTAT) 0.4 MG SL tablet Place 1 tablet (0.4 mg total) under the tongue every 5 (five) minutes x 3 doses as needed for chest pain.   OneTouch Delica Lancets 37D MISC Use to check blood glucose once a day (DX: E11.51 type 2 DM)   OXYGEN Inhale 2-4 L/min into the lungs as needed (for shortness of breath).   pantoprazole (PROTONIX) 40 MG tablet TAKE 1 TABLET TWICE DAILY   PRESCRIPTION MEDICATION CPAP- At bedtime   spironolactone (ALDACTONE) 25 MG tablet TAKE 1/2 TABLET EVERY DAY   tamsulosin (FLOMAX) 0.4 MG CAPS capsule TAKE 1 CAPSULE BY MOUTH IN THE MORNING AND AT BEDTIME.   thiamine 100 MG tablet Take 1 tablet (100 mg total) by mouth daily.   traZODone (DESYREL) 100 MG tablet Take 1 tablet (100 mg total) by mouth at bedtime.   valsartan (DIOVAN) 40 MG tablet Take 1 tablet (40 mg total) by mouth daily.   No facility-administered encounter medications on file as of 04/06/2022.     ---------------------------------------------------------------------------------------- Advance Care Planning/Goals of Care:  The value and importance of advance care planning  Identification of a healthcare agent -wants his sister to speak for him if he can't  CODE STATUS: Full Code at present  I spent 20 minutes on the phone speaking with the patient and 15 minutes in chart review.   Thank you for the  opportunity to participate in the care of Mr. Mastrangelo.  The palliative care team will continue to follow. Please call our office at [phone] if we can be of additional assistance.   Marijo Conception, FNP-C  COVID-19 PATIENT SCREENING TOOL Asked and negative response unless otherwise noted:  Have you had symptoms of covid, tested positive or been in contact with someone with symptoms/positive test in the past 5-10 days? N/A

## 2022-04-07 ENCOUNTER — Encounter: Payer: Self-pay | Admitting: Adult Health

## 2022-04-07 ENCOUNTER — Ambulatory Visit: Payer: Medicare HMO | Admitting: Adult Health

## 2022-04-07 VITALS — BP 108/60 | HR 96 | Temp 97.8°F | Ht 71.0 in | Wt 229.2 lb

## 2022-04-07 DIAGNOSIS — J9611 Chronic respiratory failure with hypoxia: Secondary | ICD-10-CM | POA: Diagnosis not present

## 2022-04-07 DIAGNOSIS — J449 Chronic obstructive pulmonary disease, unspecified: Secondary | ICD-10-CM | POA: Diagnosis not present

## 2022-04-07 DIAGNOSIS — I5032 Chronic diastolic (congestive) heart failure: Secondary | ICD-10-CM | POA: Diagnosis not present

## 2022-04-07 DIAGNOSIS — G4733 Obstructive sleep apnea (adult) (pediatric): Secondary | ICD-10-CM | POA: Diagnosis not present

## 2022-04-07 DIAGNOSIS — I5033 Acute on chronic diastolic (congestive) heart failure: Secondary | ICD-10-CM | POA: Diagnosis not present

## 2022-04-07 NOTE — Assessment & Plan Note (Signed)
Very severe sleep apnea.  Patient has multiple comorbidities including congestive heart failure, COPD and pulmonary hypertension.  Patient needs to restart CPAP.  His CPAP machine has become all.  Needs a new machine.  We will begin CPAP AutoSet 8 to 20 cm H2O.  Order for new CPAP machine to DME. We will try the DreamWear nasal mask.  Patient has significant facial hair and fullface mask and nasal mask do not seal properly causing significant pressure issues  Plan  Patient Instructions  Order for new CPAP machine , wear all night long with naps.  Dream wear nasal mask .  Auto CPAP pressure 8 to 20 cm auto .  Wear CPAP all night long for 6hr or more .  Saline nasal spray Twice daily   Saline nasal gel At bedtime    Continue on Oxygen 2l/m with activity and At bedtime    Continue on Symbicort 2 puffs Twice daily  , rinse after use.  Albuterol inhaler As needed   Work on not smoking .  Follow up in 2 months and As needed   Please contact office for sooner follow up if symptoms do not improve or worsen or seek emergency care

## 2022-04-07 NOTE — Assessment & Plan Note (Signed)
Continue on oxygen 2 L with activity and at bedtime to maintain O2 saturations greater than 88 to 90%

## 2022-04-07 NOTE — Assessment & Plan Note (Signed)
Appears euvolemic on exam.  Continue follow-up with cardiology and current maintenance regimen 

## 2022-04-07 NOTE — Assessment & Plan Note (Signed)
Severe COPD with asthma.  Patient is encouraged on smoking cessation. Continue the lung cancer screening program.  Continue on Symbicort twice daily.  Plan  Patient Instructions  Order for new CPAP machine , wear all night long with naps.  Dream wear nasal mask .  Auto CPAP pressure 8 to 20 cm auto .  Wear CPAP all night long for 6hr or more .  Saline nasal spray Twice daily   Saline nasal gel At bedtime    Continue on Oxygen 2l/m with activity and At bedtime    Continue on Symbicort 2 puffs Twice daily  , rinse after use.  Albuterol inhaler As needed   Work on not smoking .  Follow up in 2 months and As needed   Please contact office for sooner follow up if symptoms do not improve or worsen or seek emergency care

## 2022-04-07 NOTE — Progress Notes (Signed)
_0  ID: Kenneth Romney., male    DOB: 10-23-1960, 61 y.o.   MRN: [id number]  Chief Complaint  Patient presents with   Follow-up    Referring provider: Colon Branch, MD  HPI: 61 year old active smoker followed for severe COPD with asthma overlap, chronic respiratory failure on oxygen with activity and at bedtime and sleep apnea, pulmonary hypertension Medical history significant for coronary artery disease, congestive heart failure, polysubstance abuse and stroke  TEST/EVENTS :  2 D echo 01/10/21 -EF 55-60%, GR III DD, Mod to severe pulmonary Hypertension -PAP 21mHg  Sleep study 01/2017 AHI 86.7/hr , mild central sleep apnea, severe oxygen desats ,  CPAP titration 02/11/2017 - CPAP 20cmH2O   PFTs done on March 31, 2021 shows severe airflow obstruction prebronchodilator with FEV1 at 47%, ratio 66, FVC is 54%, postbronchodilator response with FEV1 at 53%, ratio 70, FVC 58%, (13% change)   04/07/2022 Follow up : COPD with asthma overlap, chronic respiratory failure on oxygen, pulmonary hypertension and obstructive sleep apnea Patient has underlying COPD with asthma.  Says overall breathing is doing about the same.  Gets short of breath with heavy activities.  He remains on Symbicort twice daily.  Denies any flare of cough or wheezing.  Denies any hemoptysis.  He participates in the lung cancer screening program.  Last CT chest was August 2022.  This showed emphysema.  Right middle lobe nodule measuring 5.7 mm.  No suspicious nodules.  Patient has underlying very severe sleep apnea.  Patient has previously been on CPAP.  Says his CPAP machine is greater than 159 years old.  Patient says he tries to wear it but it is not working very well and his mask does not fit.  He is wanting a new machine.  Last sleep study with CPAP titration was February 11, 2017 that showed optimal control on CPAP 20 cm H2O. Was unable to get CPAP download.  Patient says he benefits greatly from CPAP.  Feels more rested  and decreased daytime sleepiness when he wears a CPAP.   Allergies  Allergen Reactions   Metformin And Related Diarrhea and Other (See Comments)    Severe diarrhea   Wellbutrin [Bupropion] Nausea Only    Immunization History  Administered Date(s) Administered   PFIZER(Purple Top)SARS-COV-2 Vaccination 11/17/2019, 12/11/2019, 07/18/2020   Pneumococcal Polysaccharide-23 11/03/2016   Tdap 11/03/2016    Past Medical History:  Diagnosis Date   (HFpEF) heart failure with preserved ejection fraction ([place]    Echo 6/22: EF 55-60, no RWMA, GR 3 DD, elevated LVEDP, normal RVSF, RVSP 53.2, mild to moderate AV sclerosis without stenosis, dilated aortic root (41 mm), ascending aorta 37 mm   Abnormality of gait    Anxiety    CAD (coronary artery disease)    a. 10/2008 Inferolateral STEMI: 3VD w/ occluded LCX (PTCA)-->CABG x 3 (LIMA->LAD, VG->OM, VG->PDA);  b. 06/2009 NSTEMI/PCI: VG->OM 100 (BMS);  c. 05/2013 native 3VD, 3/3 patent grafts. d. 06/2016: Inferior STEMI w/ occluded SVG-OM (DES placed). LIMA-LAD and SVG-PDA patent.   Chronic bronchitis (HCC)    Chronic diastolic CHF (congestive heart failure) ([facility]    a. 02/2015 Echo: EF 50-55%, distal septal, apical, inferobasal HK, mild LVH, mild MR, mildly dil LA.   Cocaine use    Kenneth polyps    a. 09/2015 colonscopy: multiple sessile polyps, path negative for high grade dysplasia.   Depression    Emphysema (subcutaneous) (surgical) resulting from a procedure 10/2008.   Bleb resected at time of  10/2008 CABG. Marketed emphysema noted on surgical report.   Esophagitis    a. 2017 EGD: esophagitis, duodenitis.   ETOH abuse    GERD (gastroesophageal reflux disease)    Hemianopia, homonymous, right    "can't see out the right side of either eye since stroke in 2016/pt description 09/23/2016   Hepatic steatosis    Hiatal hernia    History of nuclear stress test 08/2017   Nuclear stress test 1/19: EF 35, inf-lat and ant-lat, apical sept/apical scar; no  ischemia; Intermediate Risk   Hyperlipidemia    Hypertension    Iron deficiency anemia 2010   Morbid obesity ([place])    Myocardial infarction [facility]) "several"   PAD (peripheral artery disease) ([place])    Pancreatitis 06/2015   Sleep apnea    "don't have a mask" (09/23/2016)   Stroke ([place]) 02/2015   a. 02/2015 Carotid U/S: 1-39% bilat ICA stenosis; "left me blind" (09/23/2016)   Tobacco abuse    still smokes   Tubular adenoma of Kenneth     Tobacco History: Social History   Tobacco Use  Smoking Status Every Day   Packs/day: 3.00   Years: 45.00   Total pack years: 135.00   Types: Cigarettes   Start date: 1976   Passive exposure: Current  Smokeless Tobacco Never  Tobacco Comments   Smoking 10 per day- wants to quit, wearing patches.  No longer smoking marijuana.  04/07/2022 hfb   Ready to quit: No Counseling given: Yes Tobacco comments: Smoking 10 per day- wants to quit, wearing patches.  No longer smoking marijuana.  04/07/2022 hfb   Outpatient Medications Prior to Visit  Medication Sig Dispense Refill   aspirin EC 81 MG EC tablet Take 1 tablet (81 mg total) by mouth daily with breakfast. 30 tablet 2   atorvastatin (LIPITOR) 80 MG tablet TAKE 1 TABLET EVERY DAY AT 6:00PM (Patient taking differently: Take 80 mg by mouth at bedtime.) 90 tablet 2   augmented betamethasone dipropionate (DIPROLENE-AF) 0.05 % cream APPLY TOPICALLY TWICE DAILY 50 g 1   Blood Glucose Monitoring Suppl (ONETOUCH VERIO REFLECT) w/Device KIT Use to check blood glucose daily (DX: type 2 DM - E 11.51) 1 kit 0   budesonide-formoterol (SYMBICORT) 160-4.5 MCG/ACT inhaler Inhale 2 puffs into the lungs 2 (two) times daily. (Patient taking differently: Inhale 1-2 puffs into the lungs See admin instructions. Using as needed.) 30 g 1   Cholecalciferol (VITAMIN D-3) 125 MCG (5000 UT) TABS Take 5,000 Units by mouth daily.     clopidogrel (PLAVIX) 75 MG tablet TAKE 1 TABLET EVERY DAY 90 tablet 3   Cyanocobalamin (VITAMIN  B-12) 5000 MCG SUBL Place 5,000 mcg under the tongue daily.     dapagliflozin propanediol (FARXIGA) 5 MG TABS tablet Take 1 tablet (5 mg total) by mouth daily before breakfast. Take in place of Jardaince 58m until we hear if approved for medication assistance for Jardiance. 30 tablet 0   diclofenac Sodium (VOLTAREN) 1 % GEL Apply 1 application  topically daily as needed for pain.     empagliflozin (JARDIANCE) 10 MG TABS tablet Take 1 tablet (10 mg total) by mouth daily. 30 tablet 0   ezetimibe (ZETIA) 10 MG tablet Take 1 tablet (10 mg total) by mouth daily. 90 tablet 3   fenofibrate (TRICOR) 145 MG tablet TAKE 1 TABLET EVERY DAY 90 tablet 1   folic acid (FOLVITE) 1 MG tablet Take 1 tablet (1 mg total) by mouth daily. 90 tablet 3   furosemide (  LASIX) 20 MG tablet Take 3 tablets (60 mg total) by mouth daily. 30 tablet 0   gabapentin (NEURONTIN) 100 MG capsule Take 2 capsules (200 mg total) by mouth 3 (three) times daily. 180 capsule 0   glucose blood (ONETOUCH VERIO) test strip Use to check blood glucose once a day (DX: 11.51 - type 2 DM) 100 each 4   isosorbide mononitrate (IMDUR) 60 MG 24 hr tablet TAKE 1 AND 1/2 TABLETS EVERY DAY 135 tablet 2   nicotine (NICOTINE STEP 1) 21 mg/24hr patch Place 1 patch (21 mg total) onto the skin daily. 28 patch 0   nitroGLYCERIN (NITROSTAT) 0.4 MG SL tablet Place 1 tablet (0.4 mg total) under the tongue every 5 (five) minutes x 3 doses as needed for chest pain. 25 tablet 2   OneTouch Delica Lancets 24M MISC Use to check blood glucose once a day (DX: E11.51 type 2 DM) 100 each 4   OXYGEN Inhale 2-4 L/min into the lungs as needed (for shortness of breath).     pantoprazole (PROTONIX) 40 MG tablet TAKE 1 TABLET TWICE DAILY 180 tablet 2   PRESCRIPTION MEDICATION CPAP- At bedtime     spironolactone (ALDACTONE) 25 MG tablet TAKE 1/2 TABLET EVERY DAY 45 tablet 2   tamsulosin (FLOMAX) 0.4 MG CAPS capsule TAKE 1 CAPSULE BY MOUTH IN THE MORNING AND AT BEDTIME. 180 capsule  1   thiamine 100 MG tablet Take 1 tablet (100 mg total) by mouth daily. 90 tablet 3   traZODone (DESYREL) 100 MG tablet Take 1 tablet (100 mg total) by mouth at bedtime. 180 tablet 1   valsartan (DIOVAN) 40 MG tablet Take 1 tablet (40 mg total) by mouth daily. 90 tablet 1   No facility-administered medications prior to visit.     Review of Systems:   Constitutional:   No  weight loss, night sweats,  Fevers, chills, + fatigue, or  lassitude.  HEENT:   No headaches,  Difficulty swallowing,  Tooth/dental problems, or  Sore throat,                No sneezing, itching, ear ache, nasal congestion, post nasal drip,   CV:  No chest pain,  Orthopnea, PND, swelling in lower extremities, anasarca, dizziness, palpitations, syncope.   GI  No heartburn, indigestion, abdominal pain, nausea, vomiting, diarrhea, change in bowel habits, loss of appetite, bloody stools.   Resp:   No non-productive cough,  No coughing up of blood.  No change in color of mucus.  No wheezing.  No chest wall deformity  Skin: no rash or lesions.  GU: no dysuria, change in color of urine, no urgency or frequency.  No flank pain, no hematuria   MS:  No joint pain or swelling.  No decreased range of motion.  No back pain.    Physical Exam  BP 108/60 (BP Location: Left Arm, Patient Position: Sitting, Cuff Size: Large)   Pulse 96   Temp 97.8 F (36.6 C) (Oral)   Ht _0  (1.803 m)   Wt 229 lb 3.2 oz (104 kg)   SpO2 91%   BMI 31.97 kg/m   GEN: A/Ox3; pleasant , NAD, well nourished    HEENT:  [place]/AT,  EACs-clear, TMs-wnl, NOSE-clear, THROAT-clear, no lesions, no postnasal drip or exudate noted.   NECK:  Supple w/ fair ROM; no JVD; normal carotid impulses w/o bruits; no thyromegaly or nodules palpated; no lymphadenopathy.    RESP  Clear  P & A; w/o, wheezes/  rales/ or rhonchi. no accessory muscle use, no dullness to percussion  CARD:  RRR, no m/r/g, no peripheral edema, pulses intact, no cyanosis or  clubbing.  GI:   Soft & nt; nml bowel sounds; no organomegaly or masses detected.   Musco: Warm bil, no deformities or joint swelling noted.   Neuro: alert, no focal deficits noted.    Skin: Warm, no lesions or rashes    Lab Results:    BMET     Imaging:       Latest Ref Rng & Units 03/31/2021    2:52 PM  PFT Results  FVC-Pre L 2.59   FVC-Predicted Pre % 54   FVC-Post L 2.77   FVC-Predicted Post % 58   Pre FEV1/FVC % % 66   Post FEV1/FCV % % 70   FEV1-Pre L 1.70   FEV1-Predicted Pre % 47   FEV1-Post L 1.93     No results found for: "NITRICOXIDE"      Assessment & Plan:   OSA (obstructive sleep apnea) ?? Very severe sleep apnea.  Patient has multiple comorbidities including congestive heart failure, COPD and pulmonary hypertension.  Patient needs to restart CPAP.  His CPAP machine has become all.  Needs a new machine.  We will begin CPAP AutoSet 8 to 20 cm H2O.  Order for new CPAP machine to DME. We will try the DreamWear nasal mask.  Patient has significant facial hair and fullface mask and nasal mask do not seal properly causing significant pressure issues  Plan  Patient Instructions  Order for new CPAP machine , wear all night long with naps.  Dream wear nasal mask .  Auto CPAP pressure 8 to 20 cm auto .  Wear CPAP all night long for 6hr or more .  Saline nasal spray Twice daily   Saline nasal gel At bedtime    Continue on Oxygen 2l/m with activity and At bedtime    Continue on Symbicort 2 puffs Twice daily  , rinse after use.  Albuterol inhaler As needed   Work on not smoking .  Follow up in 2 months and As needed   Please contact office for sooner follow up if symptoms do not improve or worsen or seek emergency care       COPD (chronic obstructive pulmonary disease) ([place]) Severe COPD with asthma.  Patient is encouraged on smoking cessation. Continue the lung cancer screening program.  Continue on Symbicort twice daily.  Plan  Patient  Instructions  Order for new CPAP machine , wear all night long with naps.  Dream wear nasal mask .  Auto CPAP pressure 8 to 20 cm auto .  Wear CPAP all night long for 6hr or more .  Saline nasal spray Twice daily   Saline nasal gel At bedtime    Continue on Oxygen 2l/m with activity and At bedtime    Continue on Symbicort 2 puffs Twice daily  , rinse after use.  Albuterol inhaler As needed   Work on not smoking .  Follow up in 2 months and As needed   Please contact office for sooner follow up if symptoms do not improve or worsen or seek emergency care      Chronic respiratory failure with hypoxia ([place]) Continue on oxygen 2 L with activity and at bedtime to maintain O2 saturations greater than 88 to 54%  Chronic diastolic CHF (congestive heart failure) (HCC) Appears euvolemic on exam.  Continue follow-up with cardiology and current maintenance regimen  Rexene Edison, NP 04/07/2022

## 2022-04-07 NOTE — Patient Instructions (Addendum)
Order for new CPAP machine , wear all night long with naps.  Dream wear nasal mask .  Auto CPAP pressure 8 to 20 cm auto .  Wear CPAP all night long for 6hr or more .  Saline nasal spray Twice daily   Saline nasal gel At bedtime    Continue on Oxygen 2l/m with activity and At bedtime    Continue on Symbicort 2 puffs Twice daily  , rinse after use.  Albuterol inhaler As needed   Work on not smoking .  Follow up in 2 months and As needed   Please contact office for sooner follow up if symptoms do not improve or worsen or seek emergency care

## 2022-04-09 ENCOUNTER — Other Ambulatory Visit: Payer: Self-pay | Admitting: Cardiovascular Disease

## 2022-04-09 ENCOUNTER — Other Ambulatory Visit: Payer: Self-pay | Admitting: Internal Medicine

## 2022-04-09 DIAGNOSIS — F17210 Nicotine dependence, cigarettes, uncomplicated: Secondary | ICD-10-CM | POA: Diagnosis not present

## 2022-04-09 DIAGNOSIS — I110 Hypertensive heart disease with heart failure: Secondary | ICD-10-CM

## 2022-04-09 DIAGNOSIS — I2510 Atherosclerotic heart disease of native coronary artery without angina pectoris: Secondary | ICD-10-CM | POA: Diagnosis not present

## 2022-04-09 DIAGNOSIS — I739 Peripheral vascular disease, unspecified: Secondary | ICD-10-CM

## 2022-04-09 DIAGNOSIS — E1151 Type 2 diabetes mellitus with diabetic peripheral angiopathy without gangrene: Secondary | ICD-10-CM | POA: Diagnosis not present

## 2022-04-09 DIAGNOSIS — I5030 Unspecified diastolic (congestive) heart failure: Secondary | ICD-10-CM

## 2022-04-13 ENCOUNTER — Encounter: Payer: Self-pay | Admitting: Family Medicine

## 2022-04-13 DIAGNOSIS — Z515 Encounter for palliative care: Secondary | ICD-10-CM | POA: Insufficient documentation

## 2022-04-20 ENCOUNTER — Telehealth: Payer: Medicare HMO

## 2022-04-21 ENCOUNTER — Ambulatory Visit: Payer: Medicare HMO | Admitting: Pharmacist

## 2022-04-21 ENCOUNTER — Telehealth: Payer: Self-pay | Admitting: Internal Medicine

## 2022-04-21 DIAGNOSIS — I5032 Chronic diastolic (congestive) heart failure: Secondary | ICD-10-CM

## 2022-04-21 DIAGNOSIS — J449 Chronic obstructive pulmonary disease, unspecified: Secondary | ICD-10-CM

## 2022-04-21 DIAGNOSIS — E1140 Type 2 diabetes mellitus with diabetic neuropathy, unspecified: Secondary | ICD-10-CM

## 2022-04-21 MED ORDER — DAPAGLIFLOZIN PROPANEDIOL 5 MG PO TABS: 5.0000 mg | ORAL_TABLET | Freq: Every day | ORAL | 1 refills | Status: DC

## 2022-04-21 NOTE — Telephone Encounter (Signed)
Patient called to speak with Tammy Eckard. He did not elaborate on purpose of call.

## 2022-04-21 NOTE — Patient Instructions (Signed)
Mr. Laton It was a pleasure speaking with you today.  Below is a summary of your health goals and summary of our recent visit. You can also view your updated Chronic Care Management Care plan through your MyChart account.   Patient Goals/Self-Care Activities Over the next 90 days, patient will:  take medications as prescribed,  focus on medication adherence by utilizing mail order pharmacy and contacting clinical pharmacist [name] [place] [phone] or [phone]) about any medications questions or concerns,  check blood pressure 2 to 3 times per week, document, and provide at future appointments,  weigh daily, and contact provider if weight gain of more than 3 lbs in 24 hours or 5 lbs in 1 week  Use Faxiga '5mg'$  daily in place of Jardiance.You have been approved for Farxiga medication assistance program thru 08/09/2022 and you should receive  delivery in the next 7 to 10 days.   Follow Up Plan: Telephone follow up appointment with care management team member scheduled for:  4 to 6 weeks.     As always if you have any questions or concerns especially regarding medications, please feel free to contact me either at the phone number below or with a MyChart message.   Keep up the good work!  Cherre Robins, PharmD Clinical Pharmacist [facility] [place] [phone] (direct line)  [phone] (main office number)   Patient verbalizes understanding of instructions and care plan provided today and agrees to view in [place]. Active MyChart status and patient understanding of how to access instructions and care plan via MyChart confirmed with patient.

## 2022-04-21 NOTE — Chronic Care Management (AMB) (Signed)
//   Care Coordination Pharmacy Note  02/16/2022 Name:  [name]. MRN:  [id number] DOB:  25-Apr-1961  Summary:   CHF: Initially prescribed Jardiance but per patient cost was too high. He started [place] 71m last week and is tolerating well. Patient is not eligible for Jardiance medication assistance program but we applied on line for Farxiga medication assistance program today and was approved. Will receive Farxiga deliver in about 7 to 10 days. .    Follow up: Patient has follow up with PCP 04/29/2022. Clinical Pharmacist Practitioner will follow up by phone in 4 to 6 weeks regarding medication assistance program.   Subjective: [name] is an 61y.o. year old male who is a primary patient of [name], JAlda Berthold MD.  The CCM team was initially consulted for assistance with disease management and care coordination needs.  Patient was referred to Palliative Care 03/2022 and initial visit was 03/2022. Patient will continue to be followed as needed but clinical pharmacist as care coordination.   Collaboration with patient by phone   for follow up visit in response to provider referral for pharmacy case management and/or care coordination services.   Consent to Services:  The patient was given information about Chronic Care Management services, agreed to services, and gave verbal consent prior to initiation of services.  Please see initial visit note for detailed documentation.   Patient Care Team: PColon Branch MD as PCP - General (Internal Medicine) MBurnell Blanks MD as PCP - Cardiology (Cardiology) HRalene Bathe MD as Consulting Physician (Ophthalmology) Pyrtle, JLajuan Lines MD as Consulting Physician (Gastroenterology) BSusa Day MD as Consulting Physician (Orthopedic Surgery) WLuretha Rued RN as Case Manager ECherre Robins RPH-CPP (Pharmacist) NRozetta Nunnery MD (Inactive) as Consulting Physician (Otolaryngology)  Recent PCP office visits:   03/39/2023 - PCP (Dr PLarose Kells follow up. Labs checked. No med changes.  Recent specialist consults:  [facility] Abdominal aortogram of lower extremity.  02/11/2022 - was suppose to have procedure but was rescheduled due to dealing with an emergency. Rescheduled for 02/18/2022 at 10:30am (arrival at 8:30a) 01/13/2022 - Cardio (Dr AFletcher Anon F/U PAD and claudication. Ordered ABI and lower ext arterial duplex. F/U 6 months. no med changes noted.  Recent Hospital[facility]:  [facility]20[facility]  Medications started at discharge: Jardiance 133mdaily  Medications stopped at discharge: none Medications changed at discharge: decreased valsartan dose to 4041maily and changed furosemide to 77m26mtake 3 tablets = 60mg48me a day.  Objective:  Lab Results  Component Value Date   CREATININE 1.09 02/05/2022   CREATININE 1.39 (H) 12/26/2021   CREATININE 1.41 (H) 12/25/2021    Lab Results  Component Value Date   HGBA1C 6.8 (H) 11/05/2021   Last diabetic Eye exam:  Lab Results  Component Value Date/Time   HMDIABEYEEXA No Retinopathy 06/30/2021 12:00 AM    Last diabetic Foot exam: No results found for: "HMDIABFOOTEX"      Component Value Date/Time   CHOL 141 05/07/2021 1347   CHOL 171 11/14/2019 1441   TRIG 142.0 05/07/2021 1347   HDL 41.70 05/07/2021 1347   HDL 37 (L) 11/14/2019 1441   CHOLHDL 3 05/07/2021 1347   VLDL 28.4 05/07/2021 1347   LDLCALC 71 05/07/2021 1347   LDLCALC 116 (H) 11/14/2019 1441   LDLDIRECT 116.0 05/12/2013 1517       Latest Ref Rng & Units 04/11/2021   10:56  AM 07/10/2020    2:45 PM 11/26/2018   12:22 PM  Hepatic Function  Total Protein 6.5 - 8.1 g/dL 7.9  7.1  7.8   Albumin 3.5 - 5.0 g/dL 4.3  4.0  3.9   AST 15 - 41 U/L '18  12  22   ' ALT 0 - 44 U/L '15  13  19   ' Alk Phosphatase 38 - 126 U/L 66  135  88   Total Bilirubin 0.3 - 1.2 mg/dL 1.5  1.2  2.1     Lab Results   Component Value Date/Time   TSH 1.895 07/07/2016 06:31 PM   TSH 1.88 05/05/2013 12:14 PM   TSH 2.290 **Test methodology is 3rd generation TSH** 06/23/2009 07:55 AM       Latest Ref Rng & Units 02/05/2022    2:47 PM 12/24/2021    2:04 AM 12/23/2021   12:49 PM  CBC  WBC 3.4 - 10.8 x10E3/uL 7.8  9.0  7.1   Hemoglobin 13.0 - 17.7 g/dL 15.4  14.8  15.0   Hematocrit 37.5 - 51.0 % 45.2  45.2  46.3   Platelets 150 - 450 x10E3/uL 206  195  216     Lab Results  Component Value Date/Time   VD25OH 39.28 11/05/2021 04:04 PM   VD25OH 10.8 (L) 11/03/2016 02:53 PM    Clinical ASCVD: Yes  The ASCVD Risk score (Arnett DK, et al., 2019) failed to calculate for the following reasons:   The patient has a prior MI or stroke diagnosis     Social History   Tobacco Use  Smoking Status Every Day   Packs/day: 3.00   Years: 45.00   Total pack years: 135.00   Types: Cigarettes   Start date: 1976   Passive exposure: Current  Smokeless Tobacco Never  Tobacco Comments   Smoking 10 per day- wants to quit, wearing patches.  No longer smoking marijuana.  04/07/2022 hfb   BP Readings from Last 3 Encounters:  04/07/22 108/60  02/18/22 121/69  02/11/22 123/73   Pulse Readings from Last 3 Encounters:  04/07/22 96  02/18/22 68  02/11/22 65   Wt Readings from Last 3 Encounters:  04/07/22 229 lb 3.2 oz (104 kg)  02/18/22 235 lb (106.6 kg)  02/11/22 235 lb (106.6 kg)    Assessment: Review of patient past medical history, allergies, medications, health status, including review of consultants reports, laboratory and other test data, was performed as part of comprehensive evaluation and provision of chronic care management services.   SDOH:  (Social Determinants of Health) assessments and interventions performed:  SDOH Interventions    Flowsheet Row Chronic Care Management [facility] [place] Management from 02/04/2022 in [facility] [place] Management from 01/15/2022 in [place] [facility] [place] ED to Hosp-Admission (Discharged) from 12/23/2021 in [place] HF PCU Telephone from 07/24/2021 in [facility] [place] Management from 06/17/2021 in [place] [facility] [place] Interventions        Food Insecurity Interventions -- Intervention Not Indicated -- Intervention Not Indicated Other (Comment)  [Mailed patient a list of local food pantries.] --  Housing Interventions -- Intervention Not Indicated -- Intervention Not Indicated -- --  Transportation Interventions -- Intervention Not Indicated -- Intervention Not Indicated -- Intervention Not Indicated  [patient has car back  for the last month. He is able to drive short distance and during daylight hours.]  Financial Strain Interventions -- Intervention Not Indicated -- Intervention Not Indicated -- --  Physical Activity Interventions -- Patient Refused Patient Refused, Other (Comments)  [limited by leg pain /COPD] -- -- --  Stress Interventions -- Intervention Not Indicated -- -- -- --  Social Connections Interventions Intervention Not Indicated Intervention Not Indicated -- -- -- --            CCM Care Plan  Allergies  Allergen Reactions   Metformin And Related Diarrhea and Other (See Comments)    Severe diarrhea   Wellbutrin [Bupropion] Nausea Only    Medications Reviewed Today     Reviewed by Cherre Robins, RPH-CPP (Pharmacist) on 04/21/22 at 2135  Med List Status: <None>   Medication Order Taking? Sig Documenting Provider Last Dose Status Informant  aspirin EC 81 MG EC tablet [id number]  Take 1 tablet (81 mg total) by mouth daily with breakfast. Roxan Hockey, MD  Active Multiple Informants  atorvastatin (LIPITOR) 80 MG tablet [id number]  TAKE 1 TABLET EVERY DAY AT 6:00PM  Patient taking  differently: Take 80 mg by mouth at bedtime.   Burnell Blanks, MD  Active Multiple Informants  augmented betamethasone dipropionate (DIPROLENE-AF) 0.05 % cream [id number]  APPLY TOPICALLY [place], [place], MD  Active Multiple Informants  Blood Glucose Monitoring Suppl [facility] VERIO REFLECT) w/Device KIT [id number]  Use to check blood glucose daily (DX: type 2 DM - E 11.51) Colon Branch, MD  Active Multiple Informants  budesonide-formoterol [facility]) 160-4.5 MCG/ACT inhaler [id number]  Inhale 2 puffs into the lungs 2 (two) times daily.  Patient taking differently: Inhale 1-2 puffs into the lungs See admin instructions. Using as needed.   Colon Branch, MD  Active Multiple Informants  Cholecalciferol (VITAMIN D-3) 125 MCG (5000 UT) TABS [id number]  Take 5,000 Units by mouth daily. [provider]  Active Multiple Informants  clopidogrel (PLAVIX) 75 MG tablet [id number]  TAKE 1 TABLET EVERY DAY Burnell Blanks, MD  Active Multiple Informants  Cyanocobalamin (VITAMIN B-12) 5000 MCG SUBL [id number]  Place 5,000 mcg under the tongue daily. [provider]  Active Multiple Informants  dapagliflozin propanediol (FARXIGA) 5 MG TABS tablet [id number]  Take 1 tablet (5 mg total) by mouth daily before breakfast. Replaces Jardiance 67m PColon Branch MD  Active   diclofenac Sodium (VOLTAREN) 1 % GEL [id number] Apply 1 application  topically daily as needed for pain. [provider]  Active Multiple Informants  ezetimibe (ZETIA) 10 MG tablet [id number] Take 1 tablet (10 mg total) by mouth daily. MBurnell Blanks MD  Active   fenofibrate (TRICOR) 145 MG tablet [id number] TAKE 1 TCrosspointe MD  Active Multiple Informants  folic acid (FOLVITE) 1 MG tablet [id number] Take 1 tablet (1 mg total) by mouth daily. ERoxan Hockey MD  Active Multiple Informants  furosemide (LASIX) 20 MG tablet [id number] TAKE 1 TO 2 TABLETS EVERY DAY IN THE MM[place]  PAbby PotashB, FNP  Active   gabapentin (NEURONTIN) 100 MG capsule [id number] Take 2 capsules (200 mg total) by mouth 3 (three) times daily. PColon Branch MD  Active Multiple Informants           Med Note (Dessie Coma  Tue Dec 23, 2021  8:41 PM)    glucose blood [facility]) test strip [id number] Use to  check blood glucose once a day (DX: 11.51 - type 2 DM) Colon Branch, MD  Active Multiple Informants  isosorbide mononitrate (IMDUR) 60 MG 24 hr tablet [id number]  TAKE 1 AND 1/2 TABLETS EVERY DAY Burnell Blanks, MD  Active Multiple Informants  nicotine (NICOTINE STEP 1) 21 mg/24hr patch [id number]  Place 1 patch (21 mg total) onto the skin daily. Colon Branch, MD  Active   nitroGLYCERIN (NITROSTAT) 0.4 MG SL tablet [id number]  Place 1 tablet (0.4 mg total) under the tongue every 5 (five) minutes x 3 doses as needed for chest pain. Colon Branch, MD  Active Multiple Informants           Med Note [place]   Tue Dec 23, 2021  2:21 PM)    Jonetta Speak Lancets [address] [place] [id number]  Use to check blood glucose once a day (DX: E11.51 type 2 DM) Colon Branch, MD  Active Multiple Informants  OXYGEN [id number]  Inhale 2-4 L/min into the lungs as needed (for shortness of breath). [provider]  Active Multiple Informants  pantoprazole (PROTONIX) 40 MG tablet [id number]  TAKE 1 TABLET TWICE DAILY Burnell Blanks, MD  Active   PRESCRIPTION MEDICATION [id number]  CPAP- At bedtime [provider]  Active Multiple Informants  spironolactone (ALDACTONE) 25 MG tablet [id number]  TAKE 1/2 TABLET EVERY DAY Burnell Blanks, MD  Active Multiple Informants  tamsulosin (FLOMAX) 0.4 MG CAPS capsule [id number]  TAKE 1 CAPSULE BY MOUTH IN THE MORNING AND AT BEDTIME. Dutch Quint B, FNP  Active   thiamine 100 MG tablet [id number]  Take 1 tablet (100 mg total) by mouth daily. Roxan Hockey, MD  Active Multiple Informants  traZODone (DESYREL) 100 MG tablet [id number]  Take 1 tablet  (100 mg total) by mouth at bedtime. Colon Branch, MD  Active Multiple Informants  valsartan (DIOVAN) 40 MG tablet [id number]  Take 1 tablet (40 mg total) by mouth daily. Colon Branch, MD  Active             Patient Active Problem List   Diagnosis Date Noted   Palliative care encounter 04/13/2022   COPD (chronic obstructive pulmonary disease) ([place]) 04/07/2022   Class 1 obesity 12/25/2021   AKI (acute kidney injury) ([place]) 12/25/2021   Acute on chronic diastolic heart failure ([place]) 12/23/2021   Chronic respiratory failure with hypoxia ([place]) 12/23/2021   COPD exacerbation ([place]) 12/23/2021   Alcohol use 12/23/2021   (HFpEF) heart failure with preserved ejection fraction (HCC)    DOE (dyspnea on exertion) 11/07/2020   Type 2 diabetes mellitus with hyperlipidemia ([place]) 03/18/2020   Prolapsed lumbar disc 01/25/2020   Hiatal hernia    Hepatic steatosis    Hemianopia, homonymous, right    ETOH abuse    Esophagitis    Chronic diastolic CHF (congestive heart failure) (HCC)    Chronic bronchitis ([place])    CAD (coronary artery disease)    PAD (peripheral artery disease) ([place]) 09/23/2016   Morbid obesity ([place])    Chronic pain syndrome 10/23/2015   GERD (gastroesophageal reflux disease) 08/07/2015   PCP NOTES >>>>> 06/01/2015   Acute ischemic stroke ([place]) 02/20/2015   BPH (benign prostatic hyperplasia) 02/20/2015   OSA (obstructive sleep apnea) ?? 05/05/2013   Erectile dysfunction 03/17/2012   Emphysema (subcutaneous) (surgical) resulting from a procedure 10/08/2008   Iron deficiency anemia 08/10/2008   ABNORMALITY OF GAIT 08/06/2008   Cigarette smoker 07/27/2008   Essential hypertension 07/26/2008  ST elevation myocardial infarction involving left circumflex coronary artery ([place]) 07/26/2008    Immunization History  Administered Date(s) Administered   PFIZER(Purple Top)SARS-COV-2 Vaccination 11/17/2019, 12/11/2019, 07/18/2020   Pneumococcal Polysaccharide-23 11/03/2016   Tdap  11/03/2016    Conditions to be addressed/monitored: CHF, CAD, HTN, HLD, Hypertriglyceridemia, COPD, DMII, and BPH; OSA; GERD; fatty liver; h/o strokePAD; sleep disturbance; anxiety/ depression  Care Plan : Pharmacy Care Plan  Updates made by Cherre Robins, RPH-CPP since 04/21/2022 12:00 AM     Problem: HTN; CHF: CAD; GERD with esophagitis; depression; BPH; COPD; bronchitis; stroke; elevated TG and hyperlipidemia; type 2 DM with h/o pancreatitis   Priority: High  Onset Date: 03/18/2021     Long-Range Goal: Management of chronic conditions and medications   Start Date: 03/18/2021  Recent Progress: On track  Priority: High  Note:   Current Barriers:  Unable to independently monitor therapeutic efficacy Unable to achieve control of Hyperlipidemia  Unable to maintain control of HTN, CHF Does not adhere to prescribed medication regimen Does not contact provider office for questions/concerns  Pharmacist Clinical Goal(s):  Over the next 90 days, patient will verbalize ability to afford treatment regimen achieve adherence to monitoring guidelines and medication adherence to achieve therapeutic efficacy achieve control of Hyperlipidemia as evidenced by LDL <70 maintain control of CHF, HTN and type 2 DM as evidenced by attaining goals listed below  adhere to prescribed medication regimen as evidenced by refill history contact provider office for questions/concerns as evidenced notation of same in electronic health record through collaboration with PharmD and provider.   Interventions: 1:1 collaboration with Colon Branch, MD regarding development and update of comprehensive plan of care as evidenced by provider attestation and co-signature Inter-disciplinary care team collaboration (see longitudinal plan of care) Comprehensive medication review performed; medication list updated in electronic medical record  Diabetes: Not at goal;  Goal A1c < 6.5% Lab Results  Component Value Date   HGBA1C  6.8 (H) 11/05/2021  Current treatment:   Farxiga 16m daily Tried metformin in past but stopped due to GI side effects (tried 5097mday and 25072may) History of pancreatitis (possibly related to alcohol use)   Current glucose readings: does not currently check BG at home Denies hypoglycemic/hyperglycemic symptoms Current meal patterns: over the last 2 to 3 weeks has been limiting bread and sweets Current exercise: none currently Previous medications tried: metformin, Jardiance - stopped due to cost.  Interventions:  Counseled on A1c and BG goals ,  Continue to limit serving sizes and intake of breads and sweets Assisted in applying for medication assistance program for FarWilder Gladeapproved thru 08/09/2022   Hypertension / CHF: Improving; goal: decrease symptoms of CHF, prevent exacerbations and BP <140/90.  Current treatment: Furosemide 40m88mtake 3 tablets = 60mg35mh morning Spironolactone 25mg 1mke 0.5 tablet = 12.5mg ea27mmorning Valsartan 40mg da41mFarxiga 5mg dail7mas scales but has not been checking weight at home. EF = 55-60% (12/24/2021) Past medications tried: lisinopril 10mg - ch63m to valsartan by pulmonologist; metoprolol - stopped due to low BP Denies hypotensive/hypertensive symptoms Recent home blood pressure: 134/64; 123/71  02/02/2022 was slightly elevated at 147/69 HR 60 - home blood pressure from visit with Juana WallThea Silversmithse Case Manager.  Interventions:  Reviewed blood pressure goals Reminded patient to rest / sit for at least 5 minutes prior to checking blood pressure.  Discussed signs and symptoms of CHF exacerbation - weight gain, SOB, abdominal fullness, swelling in legs or abdomen, Fatigue  and weakness, changes in ability to perform usual activities, persistent cough or wheezing with white or pink blood-tinged mucus, nausea and lack of appetite Restart weighing daily - report weight gain of more than 3 lbs in 24 hours or 5 lbs in 1 week.    Hyperlipidemia / CAD / history of stroke / PAD: Uncontrolled but improving; LDL goal <55 (per cardio) ; Tg goal <150 Last LDL decreased from 116 to 71 (05/07/2021)  Current treatment: Atorvastatin 52m daily Ezetimibe 119mdaily  Fenofibrate 14576maily Aspirin 65m49mily with breakfast  Clopidogrel 75mg71mly Isosorbide ER 60mg 59mke 1.5 tablets = 90mg d45m Nitroglycerin 0.4mg - p65me 1 tablet under the tongue and allow to dissolve as needed for chest pain Medications previously tried: Repatha - stopped due to cost; Brilenta  - stopped due to shortness of breath Patient reports he has restarted aspirin. He has not had a nosebleed in the last 2 weeks Interventions:  Continue aspirin 65mg dai50m Follow up as planned with Dr Arida  CoFletcher Anone current regimen for lipid lowering. Recheck lipids at next appointment with PCP.  Chronic Obstructive Pulmonary Disease / OSA: Controlled Current treatment: CPAP Symbicort (budesonide/formoterol) 160/4.5 - inhale 2 puffs into lungs twice a day Managed by pulmonologist - Dr Wert PFT'Melvyn Novas/22/2022  FEV1 1.93 (53 % ) ratio 0.70  p 13 % improvement from saba p 0 prior to study with   FV curve no signficant curvature Previous medications tried: Spiriva - not effective Interventions: (addressed at previous visit) Continue to follow up with pulmonologist  Discussed that Symbicort was for healing his lungs / for maintenance.  Encouraged him to take 2 puffs twice a day EVERY day Reminded patient to rinse mouth after using Symbicort.   Tobacco Use disorder:  Patient estarted smoking after quitting earlier in 2023. He has recommitted to quitting and has decreased number of cigarettes per day he is smoking from 3 PPD to 5 cigarettes per day. Current therapy:  Nicotine patch 21mg dail26mcotine lozenges.  Past therapies tried: Chantix (not helpful); Cold turkey - rKu[place]d; bupropion tried on 2 separate occasions- caused nausea  Interventions:   Encouraged patient to decreased number of cigarettes smoked per day.  He was provided with QUIT LINE number and has contacted them.(Addressed at previous visit) Could consider nortriptyline if needed in future as there has been some success for smoking cessation with nortriptyline. Could also help with sleep.   BPH / Urinary incontinence:  had episode of incontinence in a store recently.  He last saw urologist 08/2021. At that time patient reported that tamsulosin 0.4mg bid wa16morking well. Patient is convinced that furosemide is cause of incontinence and while it can worsen urinary frequency, he is on a low dose of furosemide 20mg daily 18mes 40mg if need74m Current therapy:  Tamsulosin 0.4mg twice a d61mInterventions: (addressed at previous visit)   Recommended he discuss furosemide with cardiologist, Dr McAlhany and uAngelena Formncontinence with urologist, Dr Newsome.   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 Sometimes has difficulty reading labels on his prescription bottles.  His sister, Christy is helAlyse Lowtient with medications as she is able (she does live in [place] so [place] able to see him frequently)   Interventions:   Reviewed refill history Assessed for medication assistance program for JardianceTime Warner  not eligible. He was approved for Farxiga medication assistance program thru 08/09/2022   Patient Goals/Self-Care Activities Over the next 90 days, patient will:  take medications as prescribed,  focus on medication adherence by utilizing mail order pharmacy and contacting clinical pharmacist [name] [place] [phone] or [phone]) about any medications questions or concerns,  check blood pressure 2 to 3 times per week, document, and provide at future appointments,   weigh daily, and contact provider if weight gain of more than 3 lbs in 24 hours or 5 lbs in 1 week  Use Faxiga 41m daily in place of Jardiance.You have been approved for Farxiga medication assistance program thru 08/09/2022 and you should receive  delivery in the next 7 to 10 days.   Follow Up Plan: Telephone follow up appointment with care management team member scheduled for:  4 to 6 weeks.         Medication Assistance:     Applied for LIS / extra help in 02/2022 has LIS 2 (partial). Patient approved for Farxiga medication assistance program thru 08/09/2022    Patient's preferred pharmacy is:  [address] [address]Phone: [phone]: [phone] [place] [address]Phone: [phone]: [phone] [address]Phone: [phone]: [address] [address]Phone: [phone]: [phone]  Uses pill box? No - patient has declined to use pill container but he is starting to use stickers on bottles to help identify time of day to take each medication  Follow Up:  Patient agrees to Care Plan and Follow-up.  Plan: follow up phone call with clinical pharmacist 4 to 6 weeks  TCherre Robins PharmD Clinical Pharmacist LTemple HillsHColumb[facility]

## 2022-04-22 NOTE — Telephone Encounter (Signed)
See phone visit note from 04/21/2022

## 2022-04-29 ENCOUNTER — Encounter: Payer: Self-pay | Admitting: Internal Medicine

## 2022-04-29 ENCOUNTER — Ambulatory Visit (INDEPENDENT_AMBULATORY_CARE_PROVIDER_SITE_OTHER): Payer: Medicare HMO | Admitting: Internal Medicine

## 2022-04-29 VITALS — BP 136/74 | HR 70 | Temp 98.4°F | Resp 20 | Ht 71.0 in | Wt 232.5 lb

## 2022-04-29 DIAGNOSIS — I739 Peripheral vascular disease, unspecified: Secondary | ICD-10-CM

## 2022-04-29 DIAGNOSIS — E785 Hyperlipidemia, unspecified: Secondary | ICD-10-CM

## 2022-04-29 DIAGNOSIS — F17210 Nicotine dependence, cigarettes, uncomplicated: Secondary | ICD-10-CM

## 2022-04-29 DIAGNOSIS — I5032 Chronic diastolic (congestive) heart failure: Secondary | ICD-10-CM

## 2022-04-29 DIAGNOSIS — I10 Essential (primary) hypertension: Secondary | ICD-10-CM | POA: Diagnosis not present

## 2022-04-29 DIAGNOSIS — E1169 Type 2 diabetes mellitus with other specified complication: Secondary | ICD-10-CM

## 2022-04-29 DIAGNOSIS — J449 Chronic obstructive pulmonary disease, unspecified: Secondary | ICD-10-CM

## 2022-04-29 MED ORDER — BETAMETHASONE DIPROPIONATE AUG 0.05 % EX CREA: TOPICAL_CREAM | Freq: Two times a day (BID) | CUTANEOUS | 1 refills | Status: DC

## 2022-04-29 NOTE — Patient Instructions (Addendum)
Vaccines I recommend:  Shingrix (shingles)  Flu shot COVID booster  For psoriasis: I am sending a refill for Diprolene, apply twice a day for 10 days.  You may need to see dermatologist  For back pain: Recommend to see your orthopedic doctor.  Do use symbicort 2 puffs BID for chest congestion and cough. If you are not improving let me know or reach out to the pulmonary doctor  Check the  blood pressure regularly BP GOAL is between 110/65 and  135/85. If it is consistently higher or lower, let me know      GO TO THE LAB : Get the blood work     [place], [place] back for a checkup in 3 months

## 2022-04-29 NOTE — Progress Notes (Unsigned)
Subjective:    Patient ID: [name]., male    DOB: [birth year], 61 y.o.   MRN: [id number]  DOS:  04/29/2022 Type of visit - description: Follow-up  Routine follow-up. Chronic medical issues reviewed. Also having back pain since a recent trip to the beach April 14, 2022.  The pain is located in the lower back, some radiation to the right leg.  Denies any bladder or bowel incontinence.  The pain is described as sharp and worse with moving. Also request psoriasis treatment. Reports he is chest remains congested.   Review of Systems See above   Past Medical History:  Diagnosis Date   (HFpEF) heart failure with preserved ejection fraction ([place])    Echo 6/22: EF 55-60, no RWMA, GR 3 DD, elevated LVEDP, normal RVSF, RVSP 53.2, mild to moderate AV sclerosis without stenosis, dilated aortic root (41 mm), ascending aorta 37 mm   Abnormality of gait    Anxiety    CAD (coronary artery disease)    a. 10/2008 Inferolateral STEMI: 3VD w/ occluded LCX (PTCA)-->CABG x 3 (LIMA->LAD, VG->OM, VG->PDA);  b. 06/2009 NSTEMI/PCI: VG->OM 100 (BMS);  c. 05/2013 native 3VD, 3/3 patent grafts. d. 06/2016: Inferior STEMI w/ occluded SVG-OM (DES placed). LIMA-LAD and SVG-PDA patent.   Chronic bronchitis (HCC)    Chronic diastolic CHF (congestive heart failure) ([place])    a. 02/2015 Echo: EF 50-55%, distal septal, apical, inferobasal HK, mild LVH, mild MR, mildly dil LA.   Cocaine use    Colon polyps    a. 09/2015 colonscopy: multiple sessile polyps, path negative for high grade dysplasia.   Depression    Emphysema (subcutaneous) (surgical) resulting from a procedure 10/2008.   Bleb resected at time of 10/2008 CABG. Marketed emphysema noted on surgical report.   Esophagitis    a. 2017 EGD: esophagitis, duodenitis.   ETOH abuse    GERD (gastroesophageal reflux disease)    Hemianopia, homonymous, right    "can't see out the right side of either eye since stroke in 2016/pt description 09/23/2016    Hepatic steatosis    Hiatal hernia    History of nuclear stress test 08/2017   Nuclear stress test 1/19: EF 35, inf-lat and ant-lat, apical sept/apical scar; no ischemia; Intermediate Risk   Hyperlipidemia    Hypertension    Iron deficiency anemia 2010   Morbid obesity ([place])    Myocardial infarction [facility]) "several"   PAD (peripheral artery disease) ([place])    Pancreatitis 06/2015   Sleep apnea    "don't have a mask" (09/23/2016)   Stroke ([place]) 02/2015   a. 02/2015 Carotid U/S: 1-39% bilat ICA stenosis; "left me blind" (09/23/2016)   Tobacco abuse    still smokes   Tubular adenoma of colon     Past Surgical History:  Procedure Laterality Date   ABDOMINAL AORTOGRAM W/LOWER EXTREMITY N/A 09/23/2016   Procedure: Abdominal Aortogram w/Lower Extremity;  Surgeon: Wellington Hampshire, MD;  Location: [place] [facility];  Service: Cardiovascular;  Laterality: N/A;   ABDOMINAL AORTOGRAM W/LOWER EXTREMITY N/A 02/18/2022   Procedure: ABDOMINAL AORTOGRAM W/LOWER EXTREMITY;  Surgeon: Wellington Hampshire, MD;  Location: [place] [facility];  Service: Cardiovascular;  Laterality: N/A;   CARDIAC CATHETERIZATION  11/07/2008   EF of 50-55% -- normal LV systolic function, acute inferolateral ST elevation MI,  PTCA of the circumflex artery -- [name]. [name], [name]   CARDIAC CATHETERIZATION N/A 07/07/2016   Procedure: Left Heart Cath and Cors/Grafts Angiography;  Surgeon: Ander Slade  Martinique, MD;  Location: [place] [facility];  Service: Cardiovascular;  Laterality: N/A;   CARDIAC CATHETERIZATION N/A 07/07/2016   Procedure: Coronary Stent Intervention;  Surgeon: [name], MD;  Location: [place] [facility];  Service: Cardiovascular;  Laterality: N/A;   CORONARY ANGIOPLASTY     CORONARY ARTERY BYPASS GRAFT     "CABG X4"   LEFT HEART CATH AND CORS/GRAFTS ANGIOGRAPHY N/A 10/01/2017   Procedure: LEFT HEART CATH AND CORS/GRAFTS ANGIOGRAPHY;  Surgeon: Burnell Blanks, MD;  Location: [place] [facility];  Service:  Cardiovascular;  Laterality: N/A;   LEFT HEART CATHETERIZATION WITH CORONARY/GRAFT ANGIOGRAM N/A 05/15/2013   Procedure: LEFT HEART CATHETERIZATION WITH Beatrix Fetters;  Surgeon: [name], MD;  Location: [facility];  Service: Cardiovascular;  Laterality: N/A;   PERIPHERAL VASCULAR ATHERECTOMY  02/18/2022   Procedure: PERIPHERAL VASCULAR ATHERECTOMY;  Surgeon: Wellington Hampshire, MD;  Location: [place] [facility];  Service: Cardiovascular;;   PERIPHERAL VASCULAR INTERVENTION  09/23/2016   Procedure: Peripheral Vascular Intervention;  Surgeon: Wellington Hampshire, MD;  Location: [place] [facility];  Service: Cardiovascular;;  Lt. SFA    Current Outpatient Medications  Medication Instructions   aspirin EC 81 mg, Oral, Daily with breakfast   atorvastatin (LIPITOR) 80 MG tablet TAKE 1 TABLET EVERY DAY AT 6:00PM   augmented betamethasone dipropionate (DIPROLENE-AF) 0.05 % cream APPLY TOPICALLY TWICE DAILY   Blood Glucose Monitoring Suppl (ONETOUCH VERIO REFLECT) w/Device KIT Use to check blood glucose daily (DX: type 2 DM - E 11.51)   budesonide-formoterol (SYMBICORT) 160-4.5 MCG/ACT inhaler 2 puffs, Inhalation, 2 times daily   clopidogrel (PLAVIX) 75 MG tablet TAKE 1 TABLET EVERY DAY   dapagliflozin propanediol (FARXIGA) 5 mg, Oral, Daily before breakfast, Replaces Jardiance 46m   diclofenac Sodium (VOLTAREN) 1 % GEL 1 application , Daily PRN   ezetimibe (ZETIA) 10 mg, Oral, Daily   fenofibrate (TRICOR) 145 MG tablet TAKE 1 TABLET EVERY DAY   folic acid (FOLVITE) 1 mg, Oral, Daily   furosemide (LASIX) 20 MG tablet TAKE 1 TO 2 TABLETS EVERY DAY IN THE MORNING   gabapentin (NEURONTIN) 200 mg, Oral, 3 times daily   glucose blood (ONETOUCH VERIO) test strip Use to check blood glucose once a day (DX: 11.51 - type 2 DM)   isosorbide mononitrate (IMDUR) 60 MG 24 hr tablet TAKE 1 AND 1/2 TABLETS EVERY DAY   nicotine (NICOTINE STEP 1) 21 mg, Transdermal, Daily   nitroGLYCERIN (NITROSTAT) 0.4  mg, Sublingual, Every 5 min x3 PRN   OneTouch Delica Lancets 392EMISC Use to check blood glucose once a day (DX: E11.51 type 2 DM)   OXYGEN 2-4 L/min, Inhalation, As needed   pantoprazole (PROTONIX) 40 MG tablet TAKE 1 TABLET TWICE DAILY   PRESCRIPTION MEDICATION CPAP- At bedtime   spironolactone (ALDACTONE) 25 MG tablet TAKE 1/2 TABLET EVERY DAY   tamsulosin (FLOMAX) 0.4 MG CAPS capsule TAKE 1 CAPSULE BY MOUTH IN THE MORNING AND AT BEDTIME.   thiamine (VITAMIN B1) 100 mg, Oral, Daily   traZODone (DESYREL) 100 mg, Oral, Daily at bedtime   valsartan (DIOVAN) 40 mg, Oral, Daily   Vitamin B-12 5,000 mcg, Sublingual, Daily   Vitamin D-3 5,000 Units, Oral, Daily       Objective:   Physical Exam Skin:         Comments: Posterior left leg: Has a plaque of scaly, slightly red skin with no pustules-blisters, the area is not warm, no fluctuant.  BP  136/74   Pulse 70   Temp 98.4 F (36.9 C) (Oral)   Resp 20   Ht '5\' 11"'  (1.803 m)   Wt 232 lb 8 oz (105.5 kg)   SpO2 94%   BMI 32.43 kg/m  General:   Well developed, NAD, BMI noted. HEENT:  Normocephalic . Face symmetric, atraumatic Lungs:  Up-and-down large airway congestion bilaterally, increased expiratory time.  No crackles Normal respiratory effort, no intercostal retractions, no accessory muscle use. Heart: RRR,  no murmur.  Lower extremities: no pretibial edema bilaterally  Skin: Not pale. Not jaundice Neurologic:  alert & oriented X3.  Speech normal, gait unassisted DTRs symmetric. Posture is okay with transferring and gait is antalgic. Psych--  Cognition and judgment appear intact.  Cooperative with normal attention span and concentration.  Behavior appropriate. No anxious or depressed appearing.      Assessment    ASSESSMENT DM HTN Dyslipidemia CV: --CAD: 2010 angioplasty f/u by  CABG 2010, cath 2014 -- CP, cath 09/2017, patent grafts, medical treatment --PVD --Stroke 2008, 02-2015, 2020 -CHF: Heart failure with  preserved EF Anxiety depression Pulm: ---OSA: Sleep study 01/2017, significant sleep apnea noted --- hypoxemia (nocturnal O2) ---COPD: Clinical diagnosis and CT findings, has not pursued PFTs Abnormal gait GI: Iron def anemia dx 03-2015  ---cscope 09-2015 multiple polyps, repeat in one year ---EGD-2017: Esophagitis, duodenitis, bx done Pancreatitis 06/2015.(EtOH 2 days prior to onset of symptoms) Etoh abuse H/o cocaine  Smoker  +FH: father prostate ca , dx late 60s Back pain:saw Ortho 01/2020, SXS felt to be mechanical due to: JD, deconditioning, residual weakness from the stroke.  PVD likely to be contributing. Vitamin D deficiency    PLAN BMP FLP AST ALT A1c DM: No ambulatory CBGs, on Farxiga, our clinical pharmacist is helping get the medication, check A1c HTN: No ambulatory BPs, BP today satisfactory, continue Lasix, Imdur, Aldactone, Diovan.  Check a BMP High cholesterol: On Lipitor, Zetia, Tricor.  Check FLP  PAD: Last visit with cardiology 01/2022, had an abdominal aortogram next month.  Further advised per vascular OSA, hypoxia, COPD: Saw pulmonary 04/07/2022:, New CPAP machine ordered. Was recommended to continue inhalers, oxygen. Was advised to quit tobacco. Still smoking some, using a nicotine patch which is helping. He is quite congested today, states that uses his inhalers "sometimes".  Advised to take Symbicort 2 puffs twice daily, if that does not bring improvement needs to contact me or pulmonary. Back pain: Chronic issue, exacerbated lately, encouraged to see Ortho again. Compliance: Multiple medications, reports his sister helps him organize his meds. Needs CCM Palliative care: Seen 04/06/2022 for CHF, chronic respiratory failure, OSA.  Goals of care discussed Vaccine advice provided RTC 3 months  3 DM: Diet controlled, check A1c. HTN: BP today satisfactory, continue Lasix, Aldactone, Diovan.  Check BMP. Dyslipidemia: On triple therapy, last FLP satisfactory, no  change CAD, CHF Saw cardiology 09/15/2021, they rec to continue aspirin Plavix statins.  Metoprolol was stopped due to bradycardia and dizziness.  His volume status was stable. OSA, hypoxemia, COPD: -On Symbicort, reports he uses oxygen at night, also is trying to use CPAP regularly ("still leaking a lot of air"). -Unfortunately he is back to smoking a pack a day. -He reports cough and chest congestion, on exam he has large airway congestion. -Advised that the best next step for him would be to quit tobacco.  Offered referral to pulmonology. -O2 sat upon arrival 90%, after resting: 90% again, we will continue checking, at some point may need  O2 supplementation 24/7. Vitamin D deficiency: Reports good compliance with supplement, checking labs. EtOH: Still drinking, "I have 3 weeks last Friday,  trying to cut down". Swallowing study S/p  modified barium swallowing study 10/02/2021.  No obvious aspiration, chin tuck posture did not improve airway protection.  Small frequent meals, small bites/sips advised. RTC 3 months

## 2022-04-30 ENCOUNTER — Other Ambulatory Visit (INDEPENDENT_AMBULATORY_CARE_PROVIDER_SITE_OTHER): Payer: Medicare HMO

## 2022-04-30 ENCOUNTER — Telehealth: Payer: Self-pay | Admitting: Internal Medicine

## 2022-04-30 DIAGNOSIS — E785 Hyperlipidemia, unspecified: Secondary | ICD-10-CM | POA: Diagnosis not present

## 2022-04-30 DIAGNOSIS — E1169 Type 2 diabetes mellitus with other specified complication: Secondary | ICD-10-CM | POA: Diagnosis not present

## 2022-04-30 LAB — BASIC METABOLIC PANEL
BUN: 18 mg/dL (ref 6–23)
CO2: 29 mEq/L (ref 19–32)
Calcium: 9.3 mg/dL (ref 8.4–10.5)
Chloride: 99 mEq/L (ref 96–112)
Creatinine, Ser: 1.1 mg/dL (ref 0.40–1.50)
GFR: 73.44 mL/min (ref >60.00)
Glucose, Bld: 80 mg/dL (ref 70–99)
Potassium: 4.2 mEq/L (ref 3.5–5.1)
Sodium: 137 mEq/L (ref 135–145)

## 2022-04-30 LAB — AST: AST: 12 U/L (ref 0–37)

## 2022-04-30 LAB — ALT: ALT: 10 U/L (ref 0–53)

## 2022-04-30 LAB — LIPID PANEL
Cholesterol: 127 mg/dL (ref 0–200)
HDL: 47.2 mg/dL (ref >39.00)
LDL Cholesterol: 60 mg/dL (ref 0–99)
NonHDL: 80.29
Total CHOL/HDL Ratio: 3
Triglycerides: 101 mg/dL (ref 0.0–149.0)
VLDL: 20.2 mg/dL (ref 0.0–40.0)

## 2022-04-30 NOTE — Assessment & Plan Note (Signed)
Routine visit DM: No ambulatory CBGs, on Farxiga, our clinical pharmacist is helping get the medication, check A1c HTN: No ambulatory BPs, BP today satisfactory, continue Lasix, Imdur, Aldactone, Diovan.  Check a BMP High cholesterol: On Lipitor, Zetia, Tricor.  Check FLP PAD: Last visit with cardiology 01/2022, s/p abdominal and LE arteriogram, further advised per vascular OSA, hypoxia, COPD: Saw pulmonary 04/07/2022:, New CPAP machine ordered. Was rec to continue inhalers, oxygen. Was advised to quit tobacco but still smokes;  using a nicotine patch which is helping. He is quite congested today, states that uses his inhalers "sometimes".  Advised to take Symbicort 2 puffs twice daily, if that does not bring improvement needs to contact me or pulmonary. Back pain: Chronic issue, exacerbated lately, encouraged to see Ortho again. Compliance: Multiple medications, reports his sister helps him organize his meds.  Our clinical pharmacist also helps. Palliative care: Seen 04/06/2022 for CHF, chronic respiratory failure, OSA.  Goals of care discussed Vaccine advice provided RTC 3 months

## 2022-04-30 NOTE — Telephone Encounter (Signed)
Patient called wanting to speak to [name] regarding his OV with Dr. [name] on 09/20. He stated that he has awful back pain and did not know why Dr. [name] did not help him out. Please advise.

## 2022-05-01 DIAGNOSIS — M545 Low back pain, unspecified: Secondary | ICD-10-CM | POA: Diagnosis not present

## 2022-05-01 DIAGNOSIS — Z6836 Body mass index (BMI) 36.0-36.9, adult: Secondary | ICD-10-CM | POA: Diagnosis not present

## 2022-05-01 NOTE — Addendum Note (Signed)
Addended by: [name] E on: 05/01/2022 01:59 PM   Modules accepted: Orders

## 2022-05-01 NOTE — Telephone Encounter (Signed)
Tried to contact patient to discuss back pain - per Dr [name] he recommended patient f/u with ortho.  No answer. LM on VM with my contact information for CB.

## 2022-05-03 NOTE — Telephone Encounter (Signed)
Please call patient, regarding his pain:  - I am not able to prescribe him pain medication.  I advised him he needs to reach out to [facility] for further back pain management.  If he needs help setting the appointment, please send a referral.  - Also advise labs look good.  A1c is pending.

## 2022-05-04 LAB — HEMOGLOBIN A1C: Hgb A1c MFr Bld: 6.8 % — ABNORMAL HIGH (ref 4.6–6.5)

## 2022-05-04 NOTE — Telephone Encounter (Signed)
LMOM informing of below. Instructed to call if questions/concerns.

## 2022-05-05 ENCOUNTER — Ambulatory Visit: Payer: Self-pay

## 2022-05-05 NOTE — Patient Instructions (Addendum)
Visit Information  Thank you for taking time to visit with me today. Please don't hesitate to contact me if I can be of assistance to you.   Following are the goals we discussed today:   Goals Addressed             This Visit's Progress    Maintain and/or improve quality of life       Care Coordination Interventions: Confirmed active with [facility] palliative care services Confirmed patient has a new CPAP machine, but is awaiting a new mask and activation of the CPAP.  Encouraged to continue efforts at smoking cessation  Encouraged to continue to eat healthy Reinforced the importance of taking medications as prescribed. Reeducated on the purpose of lasix, ezetimibe and spironolactone Reiterated the purpose of checking daily weights Update clinical pharmacist Discussed nonpharmacologic remedies for back pain such as heat. Education provided. Reviewed PCP after visit summary from 04/29/22 Encouraged patient to contact [facility] for further back pain management as recommended by PCP. Patient states he will call to schedule appointment Notify primary care provider patient could benefit from nursing chronic care management        Our next appointment is by telephone on 05/20/22 at 2:00pm  Please call the care guide team at [phone] if you need to cancel or reschedule your appointment.   If you are experiencing a Mental Health or [place] or need someone to talk to, please call the Suicide and Crisis Lifeline: 988  Patient verbalizes understanding of instructions and care plan provided today and agrees to view in [place]. Active MyChart status and patient understanding of how to access instructions and care plan via MyChart confirmed with patient.     Chronic Back Pain When back pain lasts longer than 3 months, it is called chronic back pain. The cause of your back pain may not be known. Some common causes include: Wear and tear (degenerative disease) of the  bones, ligaments, or disks in your back. Inflammation and stiffness in your back (arthritis). People who have chronic back pain often go through certain periods in which the pain is more intense (flare-ups). Many people can learn to manage the pain with home care. Follow these instructions at home: Pay attention to any changes in your symptoms. Take these actions to help with your pain: Managing pain and stiffness     If directed, apply ice to the painful area. Your health care provider may recommend applying ice during the first 24-48 hours after a flare-up begins. To do this: Put ice in a plastic bag. Place a towel between your skin and the bag. Leave the ice on for 20 minutes, 2-3 times per day. If directed, apply heat to the affected area as often as told by your health care provider. Use the heat source that your health care provider recommends, such as a moist heat pack or a heating pad. Place a towel between your skin and the heat source. Leave the heat on for 20-30 minutes. Remove the heat if your skin turns bright red. This is especially important if you are unable to feel pain, heat, or cold. You may have a greater risk of getting burned. Try soaking in a warm tub. Activity  Avoid bending and other activities that make the problem worse. Maintain a proper position when standing or sitting: When standing, keep your upper back and neck straight, with your shoulders pulled back. Avoid slouching. When sitting, keep your back straight and relax your shoulders. Do not  round your shoulders or pull them backward. Do not sit or stand in one place for long periods of time. Take brief periods of rest throughout the day. This will reduce your pain. Resting in a lying or standing position is usually better than sitting to rest. When you are resting for longer periods, mix in some mild activity or stretching between periods of rest. This will help to prevent stiffness and pain. Get regular  exercise. Ask your health care provider what activities are safe for you. Do not lift anything that is heavier than 10 lb (4.5 kg), or the limit that you are told, until your health care provider says that it is safe. Always use proper lifting technique, which includes: Bending your knees. Keeping the load close to your body. Avoiding twisting. Sleep on a firm mattress in a comfortable position. Try lying on your side with your knees slightly bent. If you lie on your back, put a pillow under your knees. Medicines Treatment may include medicines for pain and inflammation taken by mouth or applied to the skin, prescription pain medicine, or muscle relaxants. Take over-the-counter and prescription medicines only as told by your health care provider. Ask your health care provider if the medicine prescribed to you: Requires you to avoid driving or using machinery. Can cause constipation. You may need to take these actions to prevent or treat constipation: Drink enough fluid to keep your urine pale yellow. Take over-the-counter or prescription medicines. Eat foods that are high in fiber, such as beans, whole grains, and fresh fruits and vegetables. Limit foods that are high in fat and processed sugars, such as fried or sweet foods. General instructions Do not use any products that contain nicotine or tobacco, such as cigarettes, e-cigarettes, and chewing tobacco. If you need help quitting, ask your health care provider. Keep all follow-up visits as told by your health care provider. This is important. Contact a health care provider if: You have pain that is not relieved with rest or medicine. Your pain gets worse, or you have new pain. You have a high fever. You have rapid weight loss. You have trouble doing your normal activities. Get help right away if: You have weakness or numbness in one or both of your legs or feet. You have trouble controlling your bladder or your bowels. You have severe  back pain and have any of the following: Nausea or vomiting. Pain in your abdomen. Shortness of breath or you faint. Summary Chronic back pain is back pain that lasts longer than 3 months. When a flare-up begins, apply ice to the painful area for the first 24-48 hours. Apply a moist heat pad or use a heating pad on the painful area as directed by your health care provider. When you are resting for longer periods, mix in some mild activity or stretching between periods of rest. This will help to prevent stiffness and pain. This information is not intended to replace advice given to you by your health care provider. Make sure you discuss any questions you have with your health care provider. Document Revised: 09/06/2019 Document Reviewed: 09/06/2019 Elsevier Patient Education  [place].

## 2022-05-05 NOTE — Patient Outreach (Signed)
  Care Coordination   Follow Up Visit Note   05/05/2022 Name: [name]. MRN: [id number] DOB: Dec 16, 1960  [name]. is a 61 y.o. year old male who sees [name], [name], MD for primary care. I spoke with  [name]. by phone today.  What matters to the patients health and wellness today?  Patient reports he has received a new CPAP machine, but needs a mask. He reports his sister is contacting [employer] to obtain a mask and he states his daughter in law is going to help him set up the machine. Patient reports back pain, chronic is limiting his mobility at this time. States last couple days he has not take furosemide as prescribed. States he did not take any yesterday, but took 2 tablets today. He also reports he has not taken ezetimibe or spironolactone in the past couple of days reason-he is taking [place] and the medications cause him to go to the bathroom a lot. He adds his back pain is limiting his movement.   Goals Addressed             This Visit's Progress    Maintain and/or improve quality of life       Care Coordination Interventions: Confirmed active with [facility] palliative care services Confirmed patient has a new CPAP machine, but is awaiting a new mask and activation of the CPAP.  Encouraged to continue efforts at smoking cessation  Encouraged to continue to eat healthy Reinforced the importance of taking medications as prescribed. Reeducated on the purpose of lasix, ezetimibe and spironolactone Reiterated the purpose of checking daily weights Update clinical pharmacist Discussed nonpharmacologic remedies for back pain such as heat. Education provided. Reviewed PCP after visit summary from 04/29/22 Encouraged patient to contact [facility] for further back pain management as recommended by PCP. Patient states he will call to schedule appointment Notify primary care provider patient could benefit from nursing chronic care management        SDOH  assessments and interventions completed:  No     Care Coordination Interventions Activated:  Yes  Care Coordination Interventions:  Yes, provided   Follow up plan: Follow up call scheduled for 05/20/22    Encounter Outcome:  Pt. Visit Completed   Thea Silversmith, RN, MSN, BSN, [place]  Coordinator [phone]

## 2022-05-11 ENCOUNTER — Telehealth: Payer: Self-pay | Admitting: Pharmacist

## 2022-05-11 ENCOUNTER — Ambulatory Visit: Payer: Medicare HMO | Admitting: Pharmacist

## 2022-05-11 DIAGNOSIS — I5032 Chronic diastolic (congestive) heart failure: Secondary | ICD-10-CM

## 2022-05-11 DIAGNOSIS — I10 Essential (primary) hypertension: Secondary | ICD-10-CM

## 2022-05-11 DIAGNOSIS — E1169 Type 2 diabetes mellitus with other specified complication: Secondary | ICD-10-CM

## 2022-05-11 DIAGNOSIS — E785 Hyperlipidemia, unspecified: Secondary | ICD-10-CM

## 2022-05-11 NOTE — Progress Notes (Signed)
Pharmacy Note  05/11/2022 Name: [name]. MRN: [id number] DOB: 06/10/1961  Subjective: [name]. is a 61 y.o. year old male who is a primary care patient of Colon Branch, MD. Clinical Pharmacist Practitioner referral was placed to assist with medication and CHF and diabetes management.    Engaged with patient by telephone for follow up visit today.  Received message from nurse case manager that patient reported during her follow up that he was not takng furosemide as prescribed. Also had stopped ezetimibe and spironolactone since he started [place]. Nurse case manager also mentioned patient continue to have back pain.  Patient saw Dr Nancy Fetter for pain management 05/01/2022. The following medications were prescribed. Updated medication list.  pregabalin 75 mg capsule 05/01/2022 30 90 each Sandi Mariscal, MD    cyclobenzaprine 10 mg tablet 05/01/2022 20 60 each Sandi Mariscal, MD    meloxicam 15 mg tablet 05/01/2022 30 30 each Sandi Mariscal, MD    Patient did states he was not taking cyclobenzaprine due to causing drowsiness.  Patient clarified that he is taking furosemide every day, he just sometimes will not take furosemide in the morning if he is going to be away from home but that he will take furosemide later in the day. If he is away from home past 4pm he will wait and take 2 tablets of furosemide the next day (Current furosemide 51m directions are take 1 to 2 tablets daily). He states has little LEE and little shortness of breath.  Patient endorses today that he is taking both ezetimibe and spironolactone. He states he was just awaking from a nap when he spoke with nurse case manager previously and misunderstood her. Patients last LDL was 60 on 04/29/2022 which is at goal.      SDOH (Social Determinants of Health) assessments and interventions performed:  SDOH Interventions    Flowsheet Row [facility] from 02/04/2022 in [place]at [place]Management from 01/15/2022 in [place]ED to Hosp-Admission (Discharged) from 12/23/2021 in MPolkHF PCU Tele[facility] [place]Management from 06/17/20[place] LakeInterventions        Food Insecurity Interventions -- Intervention Not Indicated -- Intervention Not Indicated Other (Comment)  [Mailed patient a list of local food pantries.] --  Housing Interventions -- Intervention Not Indicated -- Intervention Not Indicated -- --  Transportation Interventions -- Intervention Not Indicated -- Intervention Not Indicated -- Intervention Not Indicated  [patient has car back for the last month. He is able to drive short distance and during daylight hours.]  Financial Strain Interventions -- Intervention Not Indicated -- Intervention Not Indicated -- --  Physical Activity Interventions -- Patient Refused Patient Refused, Other (Comments)  [limited by leg pain /COPD] -- -- --  Stress Interventions -- Intervention Not Indicated -- -- -- --  Social Connections Interventions Intervention Not Indicated Intervention Not Indicated -- -- -- --        Objective: Review of patient status, including review of consultants reports, laboratory and other test data, was performed as part of comprehensive evaluation and provision of chronic care management services.   Lab Results  Component Value Date   CREATININE 1.10 04/29/2022  CREATININE 1.09 02/05/2022   CREATININE 1.39 (H) 12/26/2021    Lab Results  Component Value Date   HGBA1C 6.8 (H) 04/30/2022       Component Value Date/Time   CHOL 127 04/29/2022 1515   CHOL 171 11/14/2019 1441   TRIG 101.0 04/29/2022 1515   HDL 47.20 04/29/2022 1515   HDL 37 (L)  11/14/2019 1441   CHOLHDL 3 04/29/2022 1515   VLDL 20.2 04/29/2022 1515   LDLCALC 60 04/29/2022 1515   LDLCALC 116 (H) 11/14/2019 1441   LDLDIRECT 116.0 05/12/2013 1517     Clinical ASCVD: Yes  The ASCVD Risk score (Arnett DK, et al., 2019) failed to calculate for the following reasons:   The patient has a prior MI or stroke diagnosis    BP Readings from Last 3 Encounters:  04/29/22 136/74  04/07/22 108/60  02/18/22 121/69     Allergies  Allergen Reactions   Metformin And Related Diarrhea and Other (See Comments)    Severe diarrhea   Wellbutrin [Bupropion] Nausea Only    Medications Reviewed Today     Reviewed by Cherre Robins, RPH-CPP (Pharmacist) on 05/11/22 at 1311  Med List Status: <None>   Medication Order Taking? Sig Documenting Provider Last Dose Status Informant  aspirin EC 81 MG EC tablet [id number] Yes Take 1 tablet (81 mg total) by mouth daily with breakfast. Roxan Hockey, MD Taking Active Multiple Informants  atorvastatin (LIPITOR) 80 MG tablet [id number] Yes TAKE 1 TABLET EVERY DAY AT 6:00PM  Patient taking differently: Take 80 mg by mouth at bedtime.   Burnell Blanks, MD Taking Active Multiple Informants  augmented betamethasone dipropionate (DIPROLENE-AF) 0.05 % cream [id number]  Apply topically 2 (two) times daily. Colon Branch, MD  Active   Blood Glucose Monitoring Suppl [facility] VERIO REFLECT) w/Device Drucie Opitz [id number] No Use to check blood glucose daily (DX: type 2 DM - E 11.51)  Patient not taking: Reported on 05/11/2022   Colon Branch, MD Not Taking Active Multiple Informants  budesonide-formoterol [facility]) 160-4.5 MCG/ACT inhaler [id number] Yes Inhale 2 puffs into the lungs 2 (two) times daily. Colon Branch, MD Taking Active Multiple Informants  Cholecalciferol (VITAMIN D-3) 125 MCG (5000 UT) TABS [id number] Yes Take 5,000 Units by mouth daily. [provider] Taking Active Multiple Informants  clopidogrel (PLAVIX) 75 MG tablet [id number]  Yes TAKE 1 TABLET EVERY DAY Burnell Blanks, MD Taking Active Multiple Informants  Cyanocobalamin (VITAMIN B-12) 5000 MCG SUBL [id number] Yes Place 5,000 mcg under the tongue daily. [provider] Taking Active Multiple Informants  cyclobenzaprine (FLEXERIL) 10 MG tablet [id number] Yes Take 10 mg by mouth 3 (three) times daily as needed. [provider] Taking Active   dapagliflozin propanediol (FARXIGA) 5 MG TABS tablet [id number] Yes Take 1 tablet (5 mg total) by mouth daily before breakfast. Replaces Jardiance 10mg  Colon Branch, MD Taking Active            Med Note Barbaraann Boys May 11, 2022  1:08 PM) Getting from [facility] and Me PAP  diclofenac Sodium (VOLTAREN) 1 % GEL [id number] Yes Apply 1 application  topically daily as needed for pain. [provider] Taking Active Multiple Informants           Med Note Barbaraann Boys May 11, 2022  1:08 PM)    ezetimibe (ZETIA) 10 MG tablet [id number] Yes Take 1 tablet (10 mg total) by mouth daily. Burnell Blanks, MD Taking Active  fenofibrate (TRICOR) 145 MG tablet [id number] Yes TAKE 1 TABLET EVERY DAY [name], [name], MD Taking Active Multiple Informants  folic acid (FOLVITE) 1 MG tablet [id number] Yes Take 1 tablet (1 mg total) by mouth daily. Roxan Hockey, MD Taking Active Multiple Informants  furosemide (LASIX) 20 MG tablet [id number] Yes TAKE 1 TO 2 TABLETS EVERY DAY IN THE [place], Abby Potash B, FNP Taking Active   glucose blood (ONETOUCH VERIO) test strip [id number] Yes Use to check blood glucose once a day (DX: 11.51 - type 2 DM) Colon Branch, MD Taking Active Multiple Informants  isosorbide mononitrate (IMDUR) 60 MG 24 hr tablet [id number] Yes TAKE 1 AND 1/2 TABLETS EVERY DAY Burnell Blanks, MD Taking Active Multiple Informants  meloxicam (MOBIC) 15 MG tablet [id number] Yes Take 15 mg by mouth daily. [provider] Taking Active   nicotine (NICOTINE STEP 1) 21 mg/24hr patch [id number]  Yes Place 1 patch (21 mg total) onto the skin daily. Colon Branch, MD Taking Active   nitroGLYCERIN (NITROSTAT) 0.4 MG SL tablet [id number] Yes Place 1 tablet (0.4 mg total) under the tongue every 5 (five) minutes x 3 doses as needed for chest pain. Colon Branch, MD Taking Active Multiple Informants           Med Note (CANTER, Cheri Rous   Wed Apr 29, 2022  2:28 PM) PRN  Jonetta Speak Lancets 30S MISC [id number] No Use to check blood glucose once a day (DX: E11.51 type 2 DM)  Patient not taking: Reported on 05/11/2022   Colon Branch, MD Not Taking Active Multiple Informants  OXYGEN [id number] Yes Inhale 2-4 L/min into the lungs as needed (for shortness of breath). [provider] Taking Active Multiple Informants           Med Note (CANTER, Cheri Rous   Wed Apr 29, 2022  2:33 PM) PRN  pantoprazole (PROTONIX) 40 MG tablet [id number] Yes TAKE 1 TABLET TWICE DAILY Burnell Blanks, MD Taking Active   pregabalin (LYRICA) 75 MG capsule [id number] Yes Take 75 mg by mouth 3 (three) times daily as needed. [provider] Taking Active   PRESCRIPTION MEDICATION [id number] Yes CPAP- At bedtime [provider] Taking Active Multiple Informants  spironolactone (ALDACTONE) 25 MG tablet [id number] Yes TAKE 1/2 TABLET EVERY DAY Burnell Blanks, MD Taking Active Multiple Informants  tamsulosin (FLOMAX) 0.4 MG CAPS capsule [id number] Yes TAKE 1 CAPSULE BY MOUTH IN THE MORNING AND AT BEDTIME. Dutch Quint B, FNP Taking Active   thiamine 100 MG tablet [id number] Yes Take 1 tablet (100 mg total) by mouth daily. Roxan Hockey, MD Taking Active Multiple Informants  traZODone (DESYREL) 100 MG tablet [id number] Yes Take 1 tablet (100 mg total) by mouth at bedtime. Colon Branch, MD Taking Active Multiple Informants  valsartan (DIOVAN) 40 MG tablet [id number] Yes Take 1 tablet (40 mg total) by mouth daily. Colon Branch, MD Taking Active             Patient Active Problem List   Diagnosis  Date Noted   Palliative care encounter 04/13/2022   COPD (chronic obstructive pulmonary disease) ([place]) 04/07/2022   Class 1 obesity 12/25/2021   AKI (acute kidney injury) ([place]) 12/25/2021   Acute on chronic diastolic heart failure ([place]) 12/23/2021   Chronic respiratory failure with hypoxia ([place]) 12/23/2021   COPD exacerbation ([place]) 12/23/2021   Alcohol use 12/23/2021   (HFpEF) heart failure with preserved ejection fraction ([place])    DOE (  dyspnea on exertion) 11/07/2020   Type 2 diabetes mellitus with hyperlipidemia ([place]) 03/18/2020   Prolapsed lumbar disc 01/25/2020   Hiatal hernia    Hepatic steatosis    Hemianopia, homonymous, right    ETOH abuse    Esophagitis    Chronic diastolic CHF (congestive heart failure) (HCC)    Chronic bronchitis ([place])    CAD (coronary artery disease)    PAD (peripheral artery disease) ([place]) 09/23/2016   Morbid obesity ([place])    Chronic pain syndrome 10/23/2015   GERD (gastroesophageal reflux disease) 08/07/2015   PCP NOTES >>>>> 06/01/2015   Acute ischemic stroke ([place]) 02/20/2015   BPH (benign prostatic hyperplasia) 02/20/2015   OSA (obstructive sleep apnea) ?? 05/05/2013   Erectile dysfunction 03/17/2012   Emphysema (subcutaneous) (surgical) resulting from a procedure 10/08/2008   Iron deficiency anemia 08/10/2008   ABNORMALITY OF GAIT 08/06/2008   Cigarette smoker 07/27/2008   Essential hypertension 07/26/2008   ST elevation myocardial infarction involving left circumflex coronary artery ([place]) 07/26/2008     Medication Assistance:   Wilder Glade obtained through [facility] and Me medication assistance program.  Enrollment ends 08/09/2022 - pateint received first shipment about 1 week ago.   Assessment / Plan:  CHF - stable Discussed importance of taking furosemide as directed. OK to take a little later in day if he will be away from home Continue spironolactone, Farxiga and valsartan Back Pain -improving Continue with meds started by Dr Nancy Fetter -  pregabalin (removed gabapentin from med list), cyclobenzaprine (can use only at night is preferred due to drowsiness) and meloxicam.  Type 2 DM - at goal, [place] mostly for CHF Continue Farxiga Hyperlipidemia - at goal of LDL < 70. Continue ezetimibe and atorvastatin 92m daily  Follow Up:  Telephone follow up appointment with care management team member scheduled for:  1 month   TCherre Robins PharmD Clinical Pharmacist [id number] [phone]

## 2022-05-11 NOTE — Telephone Encounter (Signed)
Received forwarded note from visit last week: "What matters to the patients health and wellness today?  Patient reports he has received a new CPAP machine, but needs a mask. He reports his sister is contacting [employer] to obtain a mask and he states his daughter in law is going to help him set up the machine. Patient reports back pain, chronic is limiting his mobility at this time. States last couple days he has not take furosemide as prescribed. States he did not take any yesterday, but took 2 tablets today. He also reports he has not taken ezetimibe or spironolactone in the past couple of days reason-he is taking [place] and the medications cause him to go to the bathroom a lot. He adds his back pain is limiting his movement."  Called patient to discuss above. See notes for telephone encounter.  Patient clarified that he is taking furosemide every day, he just sometimes will not take furosemide in the morning if he is going to be away from home but that he will take furosemide later in the day or 2 tablets if he misses dose one day.    Discussed back pain. He saw provider with pain management last week. Saw Dr Nancy Fetter 05/01/2022 and he prescribed the medications below:  pregabalin 75 mg capsule 05/01/2022 30 90 each Sandi Mariscal, MD   cyclobenzaprine 10 mg tablet 05/01/2022 20 55 each Sandi Mariscal, MD   meloxicam 15 mg tablet 05/01/2022 30 30 each Sandi Mariscal, MD

## 2022-05-12 ENCOUNTER — Other Ambulatory Visit: Payer: Self-pay | Admitting: Internal Medicine

## 2022-05-12 DIAGNOSIS — E1169 Type 2 diabetes mellitus with other specified complication: Secondary | ICD-10-CM

## 2022-05-12 DIAGNOSIS — I5032 Chronic diastolic (congestive) heart failure: Secondary | ICD-10-CM

## 2022-05-20 ENCOUNTER — Ambulatory Visit: Payer: Self-pay

## 2022-05-20 NOTE — Patient Outreach (Signed)
  Care Coordination   Follow Up Visit Note   05/20/2022 Name: [name]. MRN: [id number] DOB: [birth year]  [name]. is a 61 y.o. year old male who sees [name], [name], MD for primary care. I spoke with  [name]. by phone today.  What matters to the patients health and wellness today?  [name] reports he is taking medications as prescribed. He states he has received CPAP mask and is using the machine. He reports has been taking pregabalin, meloxicam and cyclobenzaprine- pre patient prescribed by the pain clinic. He reports it makes him sleepy, but states he wakes up still in pain. Patient reports he has an appointment with [facility] on tomorrow.     Goals Addressed             This Visit's Progress    Maintain and/or improve quality of life       Care Coordination Interventions: Confirmed with patient that he has received CPAP mask and is using his CPAP machine. Confirmed patient has appointment with [facility] on 05/21/22 Confirmed with patient that he has been taking his medications as prescribed Encouraged patient to continue to eat healthy Discussed daily weights and provided positive feedback regarding efforts to manage heart failure.  Discussed upcoming Holidays and encouraged patient to continue to monitor daily weights, eat healthy, monitor salt intake and fluid intake Discussed transition to Chronic Care Management team. Care Coordinator to follow until CCM case manager assigned.       SDOH assessments and interventions completed:  No  Care Coordination Interventions Activated:  Yes  Care Coordination Interventions:  Yes, provided   Follow up plan: Follow up call scheduled for 07/09/22    Encounter Outcome:  Pt. Visit Completed   Thea Silversmith, RN, MSN, BSN, [place] Coordinator [phone]

## 2022-05-21 DIAGNOSIS — M5451 Vertebrogenic low back pain: Secondary | ICD-10-CM | POA: Diagnosis not present

## 2022-05-21 DIAGNOSIS — M5459 Other low back pain: Secondary | ICD-10-CM | POA: Diagnosis not present

## 2022-06-01 ENCOUNTER — Ambulatory Visit: Payer: Self-pay

## 2022-06-01 DIAGNOSIS — I5032 Chronic diastolic (congestive) heart failure: Secondary | ICD-10-CM

## 2022-06-04 NOTE — Chronic Care Management (AMB) (Signed)
   CCM RN Visit Note    Name: [name]. MRN: [id number]      DOB: 1961/01/31  Subjective: [name]. is a 61 y.o. year old male who is a primary care patient of '@PCP'$ . The patient was referred to the Chronic Care Management team for assistance with care management needs subsequent to provider initiation of CCM services and plan of care.       An unsuccessful telephone outreach was attempted today to contact the patient about Chronic Care Management needs.    PLAN: A HIPAA compliant phone message was left for the patient providing contact information and requesting a return call.   Horris Latino RN Care Manager/Chronic Care Management [phone]

## 2022-06-08 ENCOUNTER — Telehealth: Payer: Self-pay | Admitting: Pharmacist

## 2022-06-08 ENCOUNTER — Telehealth: Payer: Medicare HMO

## 2022-06-08 DIAGNOSIS — M5416 Radiculopathy, lumbar region: Secondary | ICD-10-CM | POA: Diagnosis not present

## 2022-06-08 NOTE — Telephone Encounter (Signed)
  Care Management   Follow Up Note   06/08/2022 Name: [name]. MRN: [id number] DOB: 09-18-1960   Referred by: Colon Branch, MD Reason for referral : No chief complaint on file.   An unsuccessful telephone outreach was attempted today. The patient was referred to the case management team for assistance with care management and care coordination.   Follow Up Plan: The care management team will reach out to the patient again over the next 30 days.   Cherre Robins, PharmD Clinical Pharmacist [facility]

## 2022-06-09 ENCOUNTER — Ambulatory Visit (INDEPENDENT_AMBULATORY_CARE_PROVIDER_SITE_OTHER): Payer: Medicare HMO

## 2022-06-09 DIAGNOSIS — I110 Hypertensive heart disease with heart failure: Secondary | ICD-10-CM

## 2022-06-09 DIAGNOSIS — I509 Heart failure, unspecified: Secondary | ICD-10-CM | POA: Diagnosis not present

## 2022-06-09 DIAGNOSIS — I10 Essential (primary) hypertension: Secondary | ICD-10-CM

## 2022-06-09 DIAGNOSIS — I5032 Chronic diastolic (congestive) heart failure: Secondary | ICD-10-CM

## 2022-06-09 NOTE — Chronic Care Management (AMB) (Addendum)
Chronic Care Management Provider Comprehensive Care Plan    06/09/2022 Name: [name]. MRN: [id number] DOB: [birth year]  Referral to Chronic Care Management (CCM) services was placed by Provider:  Colon Branch, MD on Date: 05/12/22.  Chronic Condition 1: HTN Provider Assessment and Plan  HTN: No ambulatory BPs, BP today satisfactory, continue Lasix, Imdur, Aldactone, Diovan.  Check a BMP    Expected Outcome/Goals Addressed This Visit (Provider CCM goals/Provider Assessment and plan  Goal: CCM (Hypertension) Expected Outcome: Monitor, Self-Manage and Reduce Symptoms of Hypertension   Symptom Management Condition 1: Take medications as prescribed   Attend all scheduled provider appointments Call pharmacy for medication refills 3-7 days in advance of running out of medications Call provider office for new concerns or questions  Check blood pressure daily Keep a blood pressure log Call doctor for signs and symptoms of high blood pressure Attempt to engage in low impact activity/exercise as tolerated Report new symptoms to your doctor Monitor sodium intake. Increase intake of fruits and vegetable. Avoid highly processed foods when possible.   Chronic Condition 2: HF Provider Assessment and Plan  HF: Saw cardiology 09/15/2021, they rec to continue aspirin Plavix statins.  Metoprolol was stopped due to bradycardia and dizziness.  His volume status was stable.    Expected Outcome/Goals Addressed This Visit (Provider CCM goals/Provider Assessment and plan  Goal: CCM (HF) Expected Outcome: Monitor, Self-Manage and Reduce Symptoms of Heart Failure  Symptom Management Condition 2: Take medications as prescribed   Attend all scheduled provider appointments Call pharmacy for medication refills 3-7 days in advance of running out of medications Call provider office for new concerns or questions  Call office if I gain more than 3 pounds in one day or 5 pounds in one week Monitor for  swelling in feet, ankles and legs every day Keep legs up while sitting Maintain a log with daily weights Monitor sodium intake. Adjust/Limit fluid intake as advised by the Cardiology team Seek medical attention if symptoms worsen.  Problem List Patient Active Problem List   Diagnosis Date Noted   Palliative care encounter 04/13/2022   COPD (chronic obstructive pulmonary disease) ([place]) 04/07/2022   Class 1 obesity 12/25/2021   AKI (acute kidney injury) ([place]) 12/25/2021   Acute on chronic diastolic heart failure ([place]) 12/23/2021   Chronic respiratory failure with hypoxia ([place]) 12/23/2021   COPD exacerbation ([place]) 12/23/2021   Alcohol use 12/23/2021   (HFpEF) heart failure with preserved ejection fraction ([place])    DOE (dyspnea on exertion) 11/07/2020   Type 2 diabetes mellitus with hyperlipidemia ([place]) 03/18/2020   Prolapsed lumbar disc 01/25/2020   Hiatal hernia    Hepatic steatosis    Hemianopia, homonymous, right    ETOH abuse    Esophagitis    Chronic diastolic CHF (congestive heart failure) (HCC)    Chronic bronchitis ([employer])    CAD (coronary artery disease)    PAD (peripheral artery disease) ([place]) 09/23/2016   Morbid obesity ([place])    Chronic pain syndrome 10/23/2015   GERD (gastroesophageal reflux disease) 08/07/2015   PCP NOTES >>>>> 06/01/2015   Acute ischemic stroke ([place]) 02/20/2015   BPH (benign prostatic hyperplasia) 02/20/2015   OSA (obstructive sleep apnea) ?? 05/05/2013   Erectile dysfunction 03/17/2012   Emphysema (subcutaneous) (surgical) resulting from a procedure 10/08/2008   Iron deficiency anemia 08/10/2008   ABNORMALITY OF GAIT 08/06/2008   Cigarette smoker 07/27/2008   Essential hypertension 07/26/2008   ST elevation myocardial infarction involving left circumflex coronary  artery ([place]) 07/26/2008    Medication Management  Current Outpatient Medications:    furosemide (LASIX) 20 MG tablet, TAKE 1 TO 2 TABLETS EVERY DAY IN THE MORNING, Disp: 180  tablet, Rfl: 0   aspirin EC 81 MG EC tablet, Take 1 tablet (81 mg total) by mouth daily with breakfast., Disp: 30 tablet, Rfl: 2   atorvastatin (LIPITOR) 80 MG tablet, TAKE 1 TABLET EVERY DAY AT 6:00PM (Patient taking differently: Take 80 mg by mouth at bedtime.), Disp: 90 tablet, Rfl: 2   augmented betamethasone dipropionate (DIPROLENE-AF) 0.05 % cream, Apply topically 2 (two) times daily., Disp: 50 g, Rfl: 1   Blood Glucose Monitoring Suppl (ONETOUCH VERIO REFLECT) w/Device KIT, Use to check blood glucose daily (DX: type 2 DM - E 11.51) (Patient not taking: Reported on 05/11/2022), Disp: 1 kit, Rfl: 0   budesonide-formoterol (SYMBICORT) 160-4.5 MCG/ACT inhaler, Inhale 2 puffs into the lungs 2 (two) times daily., Disp: 30 g, Rfl: 1   Cholecalciferol (VITAMIN D-3) 125 MCG (5000 UT) TABS, Take 5,000 Units by mouth daily., Disp: , Rfl:    clopidogrel (PLAVIX) 75 MG tablet, TAKE 1 TABLET EVERY DAY, Disp: 90 tablet, Rfl: 3   Cyanocobalamin (VITAMIN B-12) 5000 MCG SUBL, Place 5,000 mcg under the tongue daily., Disp: , Rfl:    cyclobenzaprine (FLEXERIL) 10 MG tablet, Take 10 mg by mouth 3 (three) times daily as needed., Disp: , Rfl:    dapagliflozin propanediol (FARXIGA) 5 MG TABS tablet, Take 1 tablet (5 mg total) by mouth daily before breakfast. Replaces Jardiance 52m, Disp: 90 tablet, Rfl: 1   diclofenac Sodium (VOLTAREN) 1 % GEL, Apply 1 application  topically daily as needed for pain., Disp: , Rfl:    ezetimibe (ZETIA) 10 MG tablet, Take 1 tablet (10 mg total) by mouth daily., Disp: 90 tablet, Rfl: 3   fenofibrate (TRICOR) 145 MG tablet, TAKE 1 TABLET EVERY DAY, Disp: 90 tablet, Rfl: 1   folic acid (FOLVITE) 1 MG tablet, Take 1 tablet (1 mg total) by mouth daily., Disp: 90 tablet, Rfl: 3   glucose blood (ONETOUCH VERIO) test strip, Use to check blood glucose once a day (DX: 11.51 - type 2 DM), Disp: 100 each, Rfl: 4   isosorbide mononitrate (IMDUR) 60 MG 24 hr tablet, TAKE 1 AND 1/2 TABLETS EVERY DAY,  Disp: 135 tablet, Rfl: 2   meloxicam (MOBIC) 15 MG tablet, Take 15 mg by mouth daily., Disp: , Rfl:    nicotine (NICOTINE STEP 1) 21 mg/24hr patch, Place 1 patch (21 mg total) onto the skin daily., Disp: 28 patch, Rfl: 0   nitroGLYCERIN (NITROSTAT) 0.4 MG SL tablet, Place 1 tablet (0.4 mg total) under the tongue every 5 (five) minutes x 3 doses as needed for chest pain., Disp: 25 tablet, Rfl: 2   OneTouch Delica Lancets 369GMISC, Use to check blood glucose once a day (DX: E11.51 type 2 DM) (Patient not taking: Reported on 05/11/2022), Disp: 100 each, Rfl: 4   OXYGEN, Inhale 2-4 L/min into the lungs as needed (for shortness of breath)., Disp: , Rfl:    pantoprazole (PROTONIX) 40 MG tablet, TAKE 1 TABLET TWICE DAILY, Disp: 180 tablet, Rfl: 3   pregabalin (LYRICA) 75 MG capsule, Take 75 mg by mouth 3 (three) times daily as needed., Disp: , Rfl:    PRESCRIPTION MEDICATION, CPAP- At bedtime, Disp: , Rfl:    spironolactone (ALDACTONE) 25 MG tablet, TAKE 1/2 TABLET EVERY DAY, Disp: 45 tablet, Rfl: 2   tamsulosin (FLOMAX)  0.4 MG CAPS capsule, TAKE 1 CAPSULE BY MOUTH IN THE MORNING AND AT BEDTIME., Disp: 180 capsule, Rfl: 1   thiamine 100 MG tablet, Take 1 tablet (100 mg total) by mouth daily., Disp: 90 tablet, Rfl: 3   traZODone (DESYREL) 100 MG tablet, Take 1 tablet (100 mg total) by mouth at bedtime., Disp: 180 tablet, Rfl: 1   valsartan (DIOVAN) 40 MG tablet, Take 1 tablet (40 mg total) by mouth daily., Disp: 90 tablet, Rfl: 1  Cognitive Assessment Identity Confirmed: : Name; DOB Cognitive Status: Normal Other:  : N/A   Functional Assessment Hearing Difficulty or Deaf: no Wear Glasses or Blind: yes Vision Management: Legally Blind Concentrating, Remembering or Making Decisions Difficulty (CP): no Difficulty Communicating: no Difficulty Eating/Swallowing: no Walking or Climbing Stairs Difficulty: yes Walking or Climbing Stairs: ambulation difficulty, requires equipment; stair climbing  difficulty, requires equipment Mobility Management: Walker Dressing/Bathing Difficulty: no Doing Errands Independently Difficulty (such as shopping) (CP): no Change in Functional Status Since Onset of Current Illness/Injury: no   Caregiver Assessment  Primary Source of Support/Comfort: child(ren); sibling(s); extended family Name of Support/Comfort Primary Source: Sister - Christy Rierson; Son - Carmelina Peal; Daughter - Marlana Salvage; Daughter-in-Law - Heather McGill People in Home: alone Family Caregiver if Needed: child(ren), adult; sibling(s); other relative(s) Family Caregiver Names: Sister - Alyse Low Rierson; Son - Carmelina Peal; Daughter - Marlana Salvage; Daughter-in-Law - Heather McGill Primary Roles/Responsibilities: disabled Expected Impact of Illness/Hospitalization: N/A Concerns About Impact on Relationships: N/A   Planned Interventions  HTN Reviewed medications and plan for blood pressure management. Advised to take medications as prescribed. Advised to notify the provider if unable to tolerate the current regimen. Provided information regarding established blood pressure parameters along with indications for notifying a provider. Currently does not monitor BP. Advised to monitor a few times a week if unable to monitor daily and record readings.  Discussed activity tolerance. Currently not engaging in regular activity. Advised to start with low impact activities as tolerated. Discussed compliance with recommended cardiac prudent diet. Encouraged to read nutrition labels, monitor sodium intake and avoid highly processed foods when possible. Discussed complications of uncontrolled blood pressure.  Reviewed s/sx of heart attack, stroke and worsening symptoms that require immediate medical attention.  CHF Discussed current plan for CHF management.  Provided information regarding recommended weight parameters. Encouraged to weigh daily and record readings.  Encouraged to notify provider for  weight gain greater than 3 lbs overnight or weight gain greater than 5 lbs within a week. Reports recent weights have been maintained in the low 230's. Discussed s/sx of fluid overload and indications for notifying a provider. Experiences exertional dyspnea. Reports occasional swelling in the right leg. Reports discussing with the Cardiology team. Encouraged to assess symptoms daily and contact clinic with changes. Discussed nutritional intake. Advised to closely monitor sodium consumption and avoid highly processed foods when possible. Reviewed worsening s/sx related to CHF exacerbation and indications for seeking immediate medical attention. Assessed social determinant of health barriers. Interaction and coordination with outside resources, practitioners, and providers See CCM Referral  Care Plan: Patient declined  I have reviewed and agree with [facility] documentation.  [name], MD

## 2022-06-09 NOTE — Chronic Care Management (AMB) (Incomplete)
  Chronic Care Management   CCM RN Visit Note  06/10/2022 Name: [name]. MRN: [id number] DOB: Dec 09, 1960  Subjective: [name]. is a 61 y.o. year old male who is a primary care patient of Colon Branch, MD. The patient was referred to the Chronic Care Management team for assistance with care management needs subsequent to provider initiation of CCM services and plan of care.    Today's Visit:  {CCMTELEPHONEFACETOFACE:21091510} for {CCMINITIALFOLLOWUPCHOICE:21091511}.        Goals Addressed   None     Plan:{CM FOLLOW UP PLAN:25073}  SIG***

## 2022-06-09 NOTE — Chronic Care Management (AMB) (Addendum)
Chronic Care Management   CCM RN Visit Note   Name: [name]. MRN: [id number] DOB: [birth year]  Subjective: [name]. is a 61 y.o. year old male who is a primary care patient of Colon Branch, MD. The patient was referred to the Chronic Care Management team for assistance with care management needs subsequent to provider initiation of CCM services and plan of care.    Today's Visit:  Engaged with patient by telephone for initial visit.       Goals Addressed             This Visit's Progress    Goal: CCM (HF) Expected Outcome: Monitor, Self-Manage and Reduce Symptoms of Heart Failure       Current Barriers:  Chronic Disease Management support and education needs related to Heart Failure  Planned Interventions: Discussed current plan for CHF management.  Provided information regarding recommended weight parameters. Encouraged to weigh daily and record readings.  Encouraged to notify provider for weight gain greater than 3 lbs overnight or weight gain greater than 5 lbs within a week. Reports recent weights have been maintained in the low 230's. Discussed s/sx of fluid overload and indications for notifying a provider. Experiences exertional dyspnea. Reports occasional swelling in the right leg. Reports discussing with the Cardiology team. Encouraged to assess symptoms daily and contact clinic with changes. Discussed nutritional intake. Advised to closely monitor sodium consumption and avoid highly processed foods when possible. Reviewed worsening s/sx related to CHF exacerbation and indications for seeking immediate medical attention. Assessed social determinant of health barriers.  Wt Readings from Last 3 Encounters:  04/29/22 232 lb 8 oz (105.5 kg)  04/07/22 229 lb 3.2 oz (104 kg)  02/18/22 235 lb (106.6 kg)     Symptom Management: Take medications as prescribed   Attend all scheduled provider appointments Call pharmacy for medication refills 3-7 days in  advance of running out of medications Call provider office for new concerns or questions  Call office if I gain more than 3 pounds in one day or 5 pounds in one week Monitor for swelling in feet, ankles and legs every day Keep legs up while sitting Maintain a log with daily weights Monitor sodium intake. Adjust/Limit fluid intake as advised by the Cardiology team Seek medical attention if symptoms worsen.  Follow Up Plan:   A member of the care management team will follow up in November.       Goal: CCM (Hypertension) Expected Outcome: Monitor, Self-Manage and Reduce Symptoms of Hypertension       Current Barriers:  Chronic Disease Management support and education needs related to Hypertension Management  Planned Interventions: Reviewed medications and plan for blood pressure management. Advised to take medications as prescribed. Advised to notify the provider if unable to tolerate the current regimen. Provided information regarding established blood pressure parameters along with indications for notifying a provider. Currently does not monitor BP. Advised to monitor a few times a week if unable to monitor daily and record readings.  Discussed activity tolerance. Currently not engaging in regular activity. Advised to start with low impact activities as tolerated. Discussed compliance with recommended cardiac prudent diet. Encouraged to read nutrition labels, monitor sodium intake and avoid highly processed foods when possible. Discussed complications of uncontrolled blood pressure.  Reviewed s/sx of heart attack, stroke and worsening symptoms that require immediate medical attention.  BP Readings from Last 3 Encounters:  04/29/22 136/74  04/07/22 108/60  02/18/22 121/69  Symptom Management: Take medications as prescribed   Attend all scheduled provider appointments Call pharmacy for medication refills 3-7 days in advance of running out of medications Call provider office for  new concerns or questions  Check blood pressure daily Keep a blood pressure log Call doctor for signs and symptoms of high blood pressure Attempt to engage in low impact activity/exercise as tolerated Report new symptoms to your doctor Monitor sodium intake. Increase intake of fruits and vegetable. Avoid highly processed foods when possible.  Follow Up Plan:  A member of the care management team will follow up in November.            PLAN A member of the care management team will follow up in November   [facility] Manager/Chronic Care Management [phone]      I have reviewed and agree with [facility] Co[facility] documentation.  [name], MD

## 2022-06-15 ENCOUNTER — Telehealth: Payer: Self-pay

## 2022-06-15 NOTE — Progress Notes (Signed)
  Chronic Care Management Note  06/15/2022 Name: [name]. MRN: [id number] DOB: 22-Mar-1961  [name]. is a 61 y.o. year old male who is a primary care patient of Colon Branch, MD and is actively engaged with the care management team. I reached out to [name]. by phone today to assist with re-scheduling a follow up visit with the Pharmacist  Follow up plan: Unsuccessful telephone outreach attempt made. A HIPAA compliant phone message was left for the patient providing contact information and requesting a return call.  The care management team will reach out to the patient again over the next 7 days.  If patient returns call to provider office, please advise to call [facility]  at [place], [place], [place] [address] Direct Dial: [phone] [email or web address]

## 2022-06-16 ENCOUNTER — Telehealth: Payer: Self-pay

## 2022-06-16 DIAGNOSIS — M5451 Vertebrogenic low back pain: Secondary | ICD-10-CM | POA: Diagnosis not present

## 2022-06-16 NOTE — Patient Outreach (Signed)
  Care Coordination   Care Coordination  Visit Note   06/16/2022 Name: [name]. MRN: [id number] DOB: 1960/08/17  [name]. is a 61 y.o. year old male who sees [name], [name], MD for primary care.  No telephone contact was made with patient during this encounter  What matters to the patients health and wellness today?  Care coordination/collaboration with CCM nurse, Donald Siva, who confirms that patient has transitioned to CCM services. Patient will continue to be followed by CCM nurse.   Goals Addressed             This Visit's Progress    COMPLETED: Maintain and/or improve quality of life       Care Coordination Interventions: Confirmed patient has transitioned to CCM team      SDOH assessments and interventions completed:  No  Care Coordination Interventions Activated:  Yes  Care Coordination Interventions:  Yes, provided   Follow up plan: No further intervention required.   Encounter Outcome:  Pt. Visit Completed   Thea Silversmith, RN, MSN, BSN, CCM Care Coordinator [phone]

## 2022-06-17 ENCOUNTER — Other Ambulatory Visit: Payer: Self-pay | Admitting: Internal Medicine

## 2022-06-17 ENCOUNTER — Other Ambulatory Visit: Payer: Self-pay | Admitting: Cardiovascular Disease

## 2022-06-19 NOTE — Patient Instructions (Signed)
Thank you for allowing the Chronic Care Management team to participate in your care. It was great speaking with you!  Our next telephone outreach is scheduled for July 09, 2022 at 2pm. Please do not hesitate to call if you require assistance prior to our next outreach. Please call the care guide team at [phone] if you need to cancel or reschedule your appointment.     Following is a copy of your full provider care plan:   Goals Addressed             This Visit's Progress    Goal: CCM (HF) Expected Outcome: Monitor, Self-Manage and Reduce Symptoms of Heart Failure       Current Barriers:  Chronic Disease Management support and education needs related to Heart Failure  Planned Interventions: Discussed current plan for CHF management.  Provided information regarding recommended weight parameters. Encouraged to weigh daily and record readings.  Encouraged to notify provider for weight gain greater than 3 lbs overnight or weight gain greater than 5 lbs within a week. Reports recent weights have been maintained in the low 230's. Discussed s/sx of fluid overload and indications for notifying a provider. Experiences exertional dyspnea. Reports occasional swelling in the right leg. Reports discussing with the Cardiology team. Encouraged to assess symptoms daily and contact clinic with changes. Discussed nutritional intake. Advised to closely monitor sodium consumption and avoid highly processed foods when possible. Reviewed worsening s/sx related to CHF exacerbation and indications for seeking immediate medical attention. Assessed social determinant of health barriers.  Wt Readings from Last 3 Encounters:  04/29/22 232 lb 8 oz (105.5 kg)  04/07/22 229 lb 3.2 oz (104 kg)  02/18/22 235 lb (106.6 kg)     Symptom Management: Take medications as prescribed   Attend all scheduled provider appointments Call pharmacy for medication refills 3-7 days in advance of running out of  medications Call provider office for new concerns or questions  Call office if I gain more than 3 pounds in one day or 5 pounds in one week Monitor for swelling in feet, ankles and legs every day Keep legs up while sitting Maintain a log with daily weights Monitor sodium intake. Adjust/Limit fluid intake as advised by the Cardiology team Seek medical attention if symptoms worsen.  Follow Up Plan:   A member of the care management team will follow up in November.       Goal: CCM (Hypertension) Expected Outcome: Monitor, Self-Manage and Reduce Symptoms of Hypertension       Current Barriers:  Chronic Disease Management support and education needs related to Hypertension Management  Planned Interventions: Reviewed medications and plan for blood pressure management. Advised to take medications as prescribed. Advised to notify the provider if unable to tolerate the current regimen. Provided information regarding established blood pressure parameters along with indications for notifying a provider. Currently does not monitor BP. Advised to monitor a few times a week if unable to monitor daily and record readings.  Discussed activity tolerance. Currently not engaging in regular activity. Advised to start with low impact activities as tolerated. Discussed compliance with recommended cardiac prudent diet. Encouraged to read nutrition labels, monitor sodium intake and avoid highly processed foods when possible. Discussed complications of uncontrolled blood pressure.  Reviewed s/sx of heart attack, stroke and worsening symptoms that require immediate medical attention.  BP Readings from Last 3 Encounters:  04/29/22 136/74  04/07/22 108/60  02/18/22 121/69     Symptom Management: Take medications as prescribed  Attend all scheduled provider appointments Call pharmacy for medication refills 3-7 days in advance of running out of medications Call provider office for new concerns or questions   Check blood pressure daily Keep a blood pressure log Call doctor for signs and symptoms of high blood pressure Attempt to engage in low impact activity/exercise as tolerated Report new symptoms to your doctor Monitor sodium intake. Increase intake of fruits and vegetable. Avoid highly processed foods when possible.  Follow Up Plan:  A member of the care management team will follow up in November.          Mr. Mcmanaman verbalized understanding of instructions, educational materials, and care plan provided today. Declined offer to receive copy of patient instructions, educational materials, and care plan.   A member of the care management team will follow up in November.   Horris Latino RN Care Manager/Chronic Care Management [phone]

## 2022-06-22 ENCOUNTER — Other Ambulatory Visit: Payer: Self-pay | Admitting: Internal Medicine

## 2022-06-26 ENCOUNTER — Telehealth: Payer: Self-pay

## 2022-06-26 NOTE — Telephone Encounter (Signed)
Mailed AZ&[place] renewal application to patient home.   Please have office staff fax to [phone] ATTENTION Kamara Allan. Patient will be returning to office.   Jessalynn Mccowan L. CPhT Rx Patient Advocate 

## 2022-06-30 DIAGNOSIS — M5416 Radiculopathy, lumbar region: Secondary | ICD-10-CM | POA: Diagnosis not present

## 2022-06-30 NOTE — Progress Notes (Signed)
  Chronic Care Management Note  06/30/2022 Name: [name]. MRN: [id number] DOB: [birth year]  [name]. is a 61 y.o. year old male who is a primary care patient of Colon Branch, MD and is actively engaged with the care management team. I reached out to [name]. by phone today to assist with re-scheduling a follow up visit with the Pharmacist  Follow up plan: Unsuccessful telephone outreach attempt made. A HIPAA compliant phone message was left for the patient providing contact information and requesting a return call.  The care management team will reach out to the patient again over the next 7 days.  If patient returns call to provider office, please advise to call [facility]  at [place], [place], [place] [address] Direct Dial: [phone] [email or web address]

## 2022-07-09 ENCOUNTER — Ambulatory Visit (INDEPENDENT_AMBULATORY_CARE_PROVIDER_SITE_OTHER): Payer: Medicare HMO

## 2022-07-09 DIAGNOSIS — I5032 Chronic diastolic (congestive) heart failure: Secondary | ICD-10-CM

## 2022-07-09 DIAGNOSIS — I5030 Unspecified diastolic (congestive) heart failure: Secondary | ICD-10-CM | POA: Diagnosis not present

## 2022-07-09 DIAGNOSIS — I110 Hypertensive heart disease with heart failure: Secondary | ICD-10-CM

## 2022-07-09 DIAGNOSIS — I10 Essential (primary) hypertension: Secondary | ICD-10-CM

## 2022-07-13 NOTE — Progress Notes (Signed)
  Chronic Care Management Note  07/13/2022 Name: [name]. MRN: [id number] DOB: Aug 24, 1960  [name]. is a 61 y.o. year old male who is a primary care patient of Colon Branch, MD and is actively engaged with the care management team. I reached out to [employer]. by phone today to assist with re-scheduling a follow up visit with the Pharmacist  Follow up plan: Unable to make contact on outreach attempts x 3. PCP Colon Branch, MD notified via routed documentation in medical record.   Noreene Larsson, [place], [place] [address] Direct Dial: [phone] [email or web address]

## 2022-07-14 ENCOUNTER — Other Ambulatory Visit: Payer: Self-pay | Admitting: Internal Medicine

## 2022-07-16 NOTE — Chronic Care Management (AMB) (Signed)
Chronic Care Management   CCM RN Visit Note   Name: [name]. MRN: [id number] DOB: 1961-06-18  Subjective: [name]. is a 61 y.o. year old male who is a primary care patient of Colon Branch, MD. The patient was referred to the Chronic Care Management team for assistance with care management needs subsequent to provider initiation of CCM services and plan of care.    Today's Visit:  Engaged with patient by telephone for follow up visit.        Goals Addressed             This Visit's Progress    Goal: CCM (HF) Expected Outcome: Monitor, Self-Manage and Reduce Symptoms of Heart Failure       Current Barriers:  Chronic Disease Management support and education needs related to Heart Failure  Planned Interventions: Reviewed plan for CHF management. Reports taking medications as prescribed. Reviewed weight parameters and reviewed indications for notifying a provider. Reports weighing as advised. Denies weight gain greater than 3 lbs overnight or 5 lbs within a week. Reports current weight of 230 lbs.  Reviewed s/sx of fluid overload and indications for notifying a provider. Denies recent abdominal or lower extremity edema. Currently experiencing a non-productive cough. Continues to experience shortness of breath with mild-moderate activity. Denies shortness of breath at rest. Discussed importance of assessing symptoms daily and notifying the clinic or Cardiology team with changes. Reviewed nutritional intake. Advised to continue monitoring sodium intake and adhere to fluid restrictions if instructed by the Cardiology team. Reviewed worsening s/sx related to CHF exacerbation and indications for seeking immediate medical attention. Advised to complete outreach with the Cardiology team as scheduled on 07/21/22.   Symptom Management: Take medications as prescribed   Attend all scheduled provider appointments Call pharmacy for medication refills 3-7 days in advance of  running out of medications Call provider office for new concerns or questions  Call office if I gain more than 3 pounds in one day or 5 pounds in one week Monitor for swelling in feet, ankles and legs every day Keep legs up while sitting Maintain a log with daily weights Monitor sodium intake. Adjust/Limit fluid intake as advised by the Cardiology team Seek medical attention if symptoms worsen.  Follow Up Plan:   Will follow up  next month.       Goal: CCM (Hypertension) Expected Outcome: Monitor, Self-Manage and Reduce Symptoms of Hypertension       Current Barriers:  Chronic Disease Management support and education needs related to Hypertension Management  Planned Interventions: Reviewed plan for hypertension management. Reviewed medications. Reports taking medications as prescribed. Denies concerns r/t current regimen.  Reviewed established blood pressure parameters. Reports not monitoring since last outreach. Strongly advised to monitor a few times a week if unable to monitor daily.  Reviewed symptoms. Denies chest pain or palpitations. Denies headaches, dizziness, or visual changes. Reviewed activity tolerance. Exercising remains difficult d/t limited mobility r/t joint pain. Reports completing outreach with [facility] and receiving steroid injection for pain. Reports mobility remains somewhat difficult. Scheduled for follow up with the Ortho team on 07/28/22. Reviewed safety and fall prevention measures. Reviewed complications of uncontrolled blood pressure.  Reviewed pending appointments. Advised to complete outreach with the Cardiology team as scheduled on 07/21/22. Reviewed s/sx of heart attack, stroke and worsening symptoms that require immediate medical attention.   Symptom Management: Take medications as prescribed   Attend all scheduled provider appointments Call pharmacy for medication refills  3-7 days in advance of running out of medications Call provider office for  new concerns or questions  Check blood pressure daily Keep a blood pressure log Call doctor for signs and symptoms of high blood pressure Attempt to engage in low impact activity/exercise as tolerated Report new symptoms to your doctor Monitor sodium intake. Increase intake of fruits and vegetable. Avoid highly processed foods when possible.  Follow Up Plan:  Will follow up next month          PLAN A member of the care management team will follow up next month.   Horris Latino RN Care Manager/Chronic Care Management [phone]

## 2022-07-16 NOTE — Patient Instructions (Signed)
Thank you for allowing the Chronic Care Management team to participate in your care. It was great speaking with you!    Following is a copy of your full provider care plan:   Goals Addressed             This Visit's Progress    Goal: CCM (HF) Expected Outcome: Monitor, Self-Manage and Reduce Symptoms of Heart Failure       Current Barriers:  Chronic Disease Management support and education needs related to Heart Failure  Planned Interventions: Reviewed plan for CHF management. Reports taking medications as prescribed. Reviewed weight parameters and reviewed indications for notifying a provider. Reports weighing as advised. Denies weight gain greater than 3 lbs overnight or 5 lbs within a week. Reports current weight of 230 lbs.  Reviewed s/sx of fluid overload and indications for notifying a provider. Denies recent abdominal or lower extremity edema. Currently experiencing a non-productive cough. Continues to experience shortness of breath with mild-moderate activity. Denies shortness of breath at rest. Discussed importance of assessing symptoms daily and notifying the clinic or Cardiology team with changes. Reviewed nutritional intake. Advised to continue monitoring sodium intake and adhere to fluid restrictions if instructed by the Cardiology team. Reviewed worsening s/sx related to CHF exacerbation and indications for seeking immediate medical attention. Advised to complete outreach with the Cardiology team as scheduled on 07/21/22.   Symptom Management: Take medications as prescribed   Attend all scheduled provider appointments Call pharmacy for medication refills 3-7 days in advance of running out of medications Call provider office for new concerns or questions  Call office if I gain more than 3 pounds in one day or 5 pounds in one week Monitor for swelling in feet, ankles and legs every day Keep legs up while sitting Maintain a log with daily weights Monitor sodium  intake. Adjust/Limit fluid intake as advised by the Cardiology team Seek medical attention if symptoms worsen.  Follow Up Plan:   Will follow up  next month.       Goal: CCM (Hypertension) Expected Outcome: Monitor, Self-Manage and Reduce Symptoms of Hypertension       Current Barriers:  Chronic Disease Management support and education needs related to Hypertension Management  Planned Interventions: Reviewed plan for hypertension management. Reviewed medications. Reports taking medications as prescribed. Denies concerns r/t current regimen.  Reviewed established blood pressure parameters. Reports not monitoring since last outreach. Strongly advised to monitor a few times a week if unable to monitor daily.  Reviewed symptoms. Denies chest pain or palpitations. Denies headaches, dizziness, or visual changes. Reviewed activity tolerance. Exercising remains difficult d/t limited mobility r/t joint pain. Reports completing outreach with [facility] and receiving steroid injection for pain. Reports mobility remains somewhat difficult. Scheduled for follow up with the Ortho team on 07/28/22. Reviewed safety and fall prevention measures. Reviewed complications of uncontrolled blood pressure.  Reviewed pending appointments. Advised to complete outreach with the Cardiology team as scheduled on 07/21/22. Reviewed s/sx of heart attack, stroke and worsening symptoms that require immediate medical attention.   Symptom Management: Take medications as prescribed   Attend all scheduled provider appointments Call pharmacy for medication refills 3-7 days in advance of running out of medications Call provider office for new concerns or questions  Check blood pressure daily Keep a blood pressure log Call doctor for signs and symptoms of high blood pressure Attempt to engage in low impact activity/exercise as tolerated Report new symptoms to your doctor Monitor sodium intake. Increase intake of  fruits  and vegetable. Avoid highly processed foods when possible.  Follow Up Plan:  Will follow up next month          Mr. micahel omlor understanding of instructions, information and care plan provided. Agrees to view in MyChart.   A member of the care management team will follow up next month     [place] Manager/Chronic Care Management [phone]

## 2022-07-21 ENCOUNTER — Ambulatory Visit: Payer: Medicare HMO | Attending: Cardiovascular Disease | Admitting: Cardiovascular Disease

## 2022-07-21 NOTE — Progress Notes (Deleted)
Cardiology Office Note   Date:  07/21/2022   ID:  Kenneth Romney., DOB July 06, 1961, MRN [id number]  PCP:  Colon Branch, MD  Cardiologist:  Dr. Angelena Form  No chief complaint on file.     History of Present Illness: Kenneth Sproles. is a 61 y.o. male who is here today for reevaluation of peripheral arterial disease and claudication.  He has not been seen by me since 2021.  He has known history of coronary artery disease status post CABG in 2010 and PCI on SVG to OM later that year, hypertension, hyperlipidemia, chronic diastolic heart failure, polysubstance abuse and multiple other comorbidities. He was hospitalized in November, 2017 for ST elevation myocardial infarction complicated by ventricular fibrillation. Cardiac cath showed occluded SVG to OM which was treated successfully with PCI and drug-eluting stent placement.  He was seen in 2018 for bilateral leg claudication.  Noninvasive vascular evaluation showed an ABI of 0.82 on the right and 0.66 on the left.  Angiography was done in February 2018 which showed no significant aortoiliac disease other than occluded internal iliac arteries, occluded left SFA with three-vessel runoff below the knee and significant disease affecting the right proximal SFA. I performed successful atherectomy of the left SFA followed by drug-coated balloon angioplasty and stenting of the midsegment.  Unfortunately, he continues to smoke and has significant COPD.  He is supposed to be on home oxygen but does not use it all the time.[name]  He was hospitalized last month with acute on chronic diastolic heart failure improved with diuresis.  He returns now with severe right leg claudication with no symptoms on the left side.  This happens with short distance walking.  No lower extremity ulceration.  Past Medical History:  Diagnosis Date   (HFpEF) heart failure with preserved ejection fraction ([place])    Echo 6/22: EF 55-60, no RWMA, GR 3 DD, elevated LVEDP,  normal RVSF, RVSP 53.2, mild to moderate AV sclerosis without stenosis, dilated aortic root (41 mm), ascending aorta 37 mm   Abnormality of gait    Anxiety    CAD (coronary artery disease)    a. 10/2008 Inferolateral STEMI: 3VD w/ occluded LCX (PTCA)-->CABG x 3 (LIMA->LAD, VG->OM, VG->PDA);  b. 06/2009 NSTEMI/PCI: VG->OM 100 (BMS);  c. 05/2013 native 3VD, 3/3 patent grafts. d. 06/2016: Inferior STEMI w/ occluded SVG-OM (DES placed). LIMA-LAD and SVG-PDA patent.   Chronic bronchitis (HCC)    Chronic diastolic CHF (congestive heart failure) ([place])    a. 02/2015 Echo: EF 50-55%, distal septal, apical, inferobasal HK, mild LVH, mild MR, mildly dil LA.   Cocaine use    Colon polyps    a. 09/2015 colonscopy: multiple sessile polyps, path negative for high grade dysplasia.   Depression    Emphysema (subcutaneous) (surgical) resulting from a procedure 10/2008.   Bleb resected at time of 10/2008 CABG. Marketed emphysema noted on surgical report.   Esophagitis    a. 2017 EGD: esophagitis, duodenitis.   ETOH abuse    GERD (gastroesophageal reflux disease)    Hemianopia, homonymous, right    "can't see out the right side of either eye since stroke in 2016/pt description 09/23/2016   Hepatic steatosis    Hiatal hernia    History of nuclear stress test 08/2017   Nuclear stress test 1/19: EF 35, inf-lat and ant-lat, apical sept/apical scar; no ischemia; Intermediate Risk   Hyperlipidemia    Hypertension    Iron deficiency anemia 2010   Morbid obesity ([place])  Myocardial infarction [facility]) "several"   PAD (peripheral artery disease) ([place])    Pancreatitis 06/2015   Sleep apnea    "don't have a mask" (09/23/2016)   Stroke ([place]) 02/2015   a. 02/2015 Carotid U/S: 1-39% bilat ICA stenosis; "left me blind" (09/23/2016)   Tobacco abuse    still smokes   Tubular adenoma of colon     Past Surgical History:  Procedure Laterality Date   ABDOMINAL AORTOGRAM W/LOWER EXTREMITY N/A 09/23/2016   Procedure: Abdominal  Aortogram w/Lower Extremity;  Surgeon: Wellington Hampshire, MD;  Location: [place] [facility];  Service: Cardiovascular;  Laterality: N/A;   ABDOMINAL AORTOGRAM W/LOWER EXTREMITY N/A 02/18/2022   Procedure: ABDOMINAL AORTOGRAM W/LOWER EXTREMITY;  Surgeon: Wellington Hampshire, MD;  Location: [place] [facility];  Service: Cardiovascular;  Laterality: N/A;   CARDIAC CATHETERIZATION  11/07/2008   EF of 50-55% -- normal LV systolic function, acute inferolateral ST elevation MI,  PTCA of the circumflex artery -- [name]. [name], [name]   CARDIAC CATHETERIZATION N/A 07/07/2016   Procedure: Left Heart Cath and Cors/Grafts Angiography;  Surgeon: [name], MD;  Location: [place] [facility];  Service: Cardiovascular;  Laterality: N/A;   CARDIAC CATHETERIZATION N/A 07/07/2016   Procedure: Coronary Stent Intervention;  Surgeon: [name], MD;  Location: [place] [facility];  Service: Cardiovascular;  Laterality: N/A;   CORONARY ANGIOPLASTY     CORONARY ARTERY BYPASS GRAFT     "CABG X4"   LEFT HEART CATH AND CORS/GRAFTS ANGIOGRAPHY N/A 10/01/2017   Procedure: LEFT HEART CATH AND CORS/GRAFTS ANGIOGRAPHY;  Surgeon: Burnell Blanks, MD;  Location: [place] [facility];  Service: Cardiovascular;  Laterality: N/A;   LEFT HEART CATHETERIZATION WITH CORONARY/GRAFT ANGIOGRAM N/A 05/15/2013   Procedure: LEFT HEART CATHETERIZATION WITH Beatrix Fetters;  Surgeon: [name], MD;  Location: [facility];  Service: Cardiovascular;  Laterality: N/A;   PERIPHERAL VASCULAR ATHERECTOMY  02/18/2022   Procedure: PERIPHERAL VASCULAR ATHERECTOMY;  Surgeon: Wellington Hampshire, MD;  Location: [place] [facility];  Service: Cardiovascular;;   PERIPHERAL VASCULAR INTERVENTION  09/23/2016   Procedure: Peripheral Vascular Intervention;  Surgeon: Wellington Hampshire, MD;  Location: [place] [facility];  Service: Cardiovascular;;  Lt. SFA     Current Outpatient Medications  Medication Sig Dispense Refill   aspirin EC 81  MG EC tablet Take 1 tablet (81 mg total) by mouth daily with breakfast. 30 tablet 2   atorvastatin (LIPITOR) 80 MG tablet Take 1 tablet (80 mg total) by mouth daily at 6 PM. 90 tablet 2   augmented betamethasone dipropionate (DIPROLENE-AF) 0.05 % cream Apply topically 2 (two) times daily. 50 g 1   Blood Glucose Monitoring Suppl (ONETOUCH VERIO REFLECT) w/Device KIT Use to check blood glucose daily (DX: type 2 DM - E 11.51) (Patient not taking: Reported on 05/11/2022) 1 kit 0   budesonide-formoterol (SYMBICORT) 160-4.5 MCG/ACT inhaler Inhale 2 puffs into the lungs 2 (two) times daily. 30 g 1   Cholecalciferol (VITAMIN D-3) 125 MCG (5000 UT) TABS Take 5,000 Units by mouth daily.     clopidogrel (PLAVIX) 75 MG tablet TAKE 1 TABLET EVERY DAY 90 tablet 3   Cyanocobalamin (VITAMIN B-12) 5000 MCG SUBL Place 5,000 mcg under the tongue daily.     cyclobenzaprine (FLEXERIL) 10 MG tablet Take 10 mg by mouth 3 (three) times daily as needed.     dapagliflozin propanediol (FARXIGA) 5 MG TABS tablet Take 1 tablet (5 mg total) by mouth  daily before breakfast. Replaces Jardiance 49m 90 tablet 1   diclofenac Sodium (VOLTAREN) 1 % GEL Apply 1 application  topically daily as needed for pain.     ezetimibe (ZETIA) 10 MG tablet Take 1 tablet (10 mg total) by mouth daily. 90 tablet 3   fenofibrate (TRICOR) 145 MG tablet Take 1 tablet (145 mg total) by mouth daily. 90 tablet 1   folic acid (FOLVITE) 1 MG tablet Take 1 tablet (1 mg total) by mouth daily. 90 tablet 3   furosemide (LASIX) 20 MG tablet TAKE 1 TO 2 TABLETS EVERY DAY IN THE MORNING 180 tablet 0   glucose blood (ONETOUCH VERIO) test strip Use to check blood glucose once a day (DX: 11.51 - type 2 DM) 100 each 4   isosorbide mononitrate (IMDUR) 60 MG 24 hr tablet TAKE 1 AND 1/2 TABLETS EVERY DAY 135 tablet 2   meloxicam (MOBIC) 15 MG tablet Take 15 mg by mouth daily.     nicotine (NICOTINE STEP 1) 21 mg/24hr patch Place 1 patch (21 mg total) onto the skin daily.  28 patch 0   nitroGLYCERIN (NITROSTAT) 0.4 MG SL tablet Place 1 tablet (0.4 mg total) under the tongue every 5 (five) minutes x 3 doses as needed for chest pain. 25 tablet 2   OneTouch Delica Lancets 393AMISC Use to check blood glucose once a day (DX: E11.51 type 2 DM) (Patient not taking: Reported on 05/11/2022) 100 each 4   OXYGEN Inhale 2-4 L/min into the lungs as needed (for shortness of breath).     pantoprazole (PROTONIX) 40 MG tablet TAKE 1 TABLET TWICE DAILY 180 tablet 3   pregabalin (LYRICA) 75 MG capsule Take 75 mg by mouth 3 (three) times daily as needed.     PRESCRIPTION MEDICATION CPAP- At bedtime     spironolactone (ALDACTONE) 25 MG tablet TAKE 1/2 TABLET EVERY DAY 45 tablet 2   tamsulosin (FLOMAX) 0.4 MG CAPS capsule TAKE 1 CAPSULE BY MOUTH IN THE MORNING AND AT BEDTIME. 180 capsule 1   thiamine 100 MG tablet Take 1 tablet (100 mg total) by mouth daily. 90 tablet 3   traZODone (DESYREL) 100 MG tablet Take 1 tablet (100 mg total) by mouth at bedtime. 90 tablet 1   valsartan (DIOVAN) 40 MG tablet Take 1 tablet (40 mg total) by mouth daily. 90 tablet 1   No current facility-administered medications for this visit.    Allergies:   Metformin and related and Wellbutrin [bupropion]    Social History:  The patient  reports that he has been smoking cigarettes. He started smoking about 47 years ago. He has a 135.00 pack-year smoking history. He has been exposed to tobacco smoke. He has never used smokeless tobacco. He reports current alcohol use of about 3.0 standard drinks of alcohol per week. He reports current drug use. Drug: Marijuana.   Family History:  The patient's family history includes Colon cancer in his father; Diabetes in his mother; Heart disease in his mother; Heart failure in his mother.    ROS:  Please see the history of present illness.   Otherwise, review of systems are positive for none.   All other systems are reviewed and negative.    PHYSICAL EXAM: VS:  There  were no vitals taken for this visit. , BMI There is no height or weight on file to calculate BMI. GEN: Well nourished, well developed, in no acute distress  HEENT: normal  Neck: no JVD, carotid bruits, or masses  Cardiac: RRR; no murmurs, rubs, or gallops,no edema  Respiratory:  clear to auscultation bilaterally, normal work of breathing GI: soft, nontender, nondistended, + BS MS: no deformity or atrophy  Skin: warm and dry, no rash Neuro:  Strength and sensation are intact Psych: euthymic mood, full affect Vascular: Dorsalis pedis and posterior tibialis are normal on the left side but not palpable on the right side.   EKG:  EKG is not ordered today.    Recent Labs: 12/23/2021: B Natriuretic Peptide 184.6 12/26/2021: Magnesium 2.3 02/05/2022: Hemoglobin 15.4; Platelets 206 04/29/2022: ALT 10; BUN 18; Creatinine, Ser 1.10; Potassium 4.2; Sodium 137    Lipid Panel    Component Value Date/Time   CHOL 127 04/29/2022 1515   CHOL 171 11/14/2019 1441   TRIG 101.0 04/29/2022 1515   HDL 47.20 04/29/2022 1515   HDL 37 (L) 11/14/2019 1441   CHOLHDL 3 04/29/2022 1515   VLDL 20.2 04/29/2022 1515   LDLCALC 60 04/29/2022 1515   LDLCALC 116 (H) 11/14/2019 1441   LDLDIRECT 116.0 05/12/2013 1517      Wt Readings from Last 3 Encounters:  04/29/22 232 lb 8 oz (105.5 kg)  04/07/22 229 lb 3.2 oz (104 kg)  02/18/22 235 lb (106.6 kg)           No data to display            ASSESSMENT AND PLAN:  1.  Peripheral arterial disease: The patient had previous left SFA endovascular intervention.  He is to be asymptomatic on the left side with palpable distal pulses.  However, he now complains of severe right leg claudication with no palpable pulses distally.  I requested an ABI and duplex for evaluation.  He might require angiography and endovascular intervention but I discussed with him the importance of smoking cessation first.    2. Coronary artery disease without angina: Cardiac cath in  2019 showed patent grafts.  No chest pain but has significant shortness of breath which is likely related to COPD and chronic diastolic heart failure.  3. Tobacco use: I again discussed with him the importance of smoking cessation.  4.  Mixed hyperlipidemia: He is currently on high-dose atorvastatin, Zetia and fenofibrate.  5.  Chronic diastolic heart failure: He appears to be euvolemic on furosemide 60 mg daily.  He is on good medical therapy.   Disposition:   FU with me in 6 months but earlier if lower extremity Doppler is significantly abnormal.  Signed,  Kathlyn Sacramento, MD  07/21/2022 8:23 AM    [facility]

## 2022-07-28 ENCOUNTER — Encounter: Payer: Self-pay | Admitting: Cardiovascular Disease

## 2022-07-28 ENCOUNTER — Ambulatory Visit (INDEPENDENT_AMBULATORY_CARE_PROVIDER_SITE_OTHER): Payer: Medicare HMO

## 2022-07-28 DIAGNOSIS — M5416 Radiculopathy, lumbar region: Secondary | ICD-10-CM | POA: Diagnosis not present

## 2022-07-28 DIAGNOSIS — I10 Essential (primary) hypertension: Secondary | ICD-10-CM

## 2022-07-29 ENCOUNTER — Ambulatory Visit: Payer: Medicare HMO | Admitting: Internal Medicine

## 2022-07-29 ENCOUNTER — Telehealth: Payer: Self-pay | Admitting: Adult Health

## 2022-07-29 NOTE — Telephone Encounter (Signed)
Sister ret our call on why her brother is wanting discharge off CPAP. Adv we'd have to [place].- Tnx.  [phone]

## 2022-07-29 NOTE — Telephone Encounter (Signed)
Called and left voicemail for patients sister to call office back to gather more information on why she is wanting her brother discharged off of cpap.

## 2022-07-30 NOTE — Telephone Encounter (Signed)
CPAP would be much better as discussed at ov , if not able to tolerate. Continue on Oxygen , can discuss at ov when he returns. Make sure he has follow up with Dr. Melvyn Novas

## 2022-07-30 NOTE — Telephone Encounter (Signed)
Called and spoke with pt's sister Alyse Low who states that pt is no longer wanting to wear CPAP. Pt has CHF and with this, he has a lot of congestion almost all the time. Alyse Low stated that pt has tried to wear the CPAP but pt feels like the CPAP is making the congestion worse.  Alyse Low stated that pt is wanting to still wear the O2 at night but not the CPAP. Routing to Tammy for review. Please advise.

## 2022-07-30 NOTE — Telephone Encounter (Signed)
Called and spoke with Alyse Low letting her know the recs per TP and she verbalized understanding. Scheduled pt an appt with Dr. Melvyn Novas 1/12. Nothing further needed.

## 2022-08-09 DIAGNOSIS — I110 Hypertensive heart disease with heart failure: Secondary | ICD-10-CM

## 2022-08-09 DIAGNOSIS — I509 Heart failure, unspecified: Secondary | ICD-10-CM

## 2022-08-12 NOTE — Chronic Care Management (AMB) (Addendum)
Chronic Care Management   CCM RN Visit Note   Name: [name]. MRN: [id number] DOB: March 19, 1961  Subjective: [name]. is a 62 y.o. year old male who is a primary care patient of Colon Branch, MD. The patient was referred to the Chronic Care Management team for assistance with care management needs subsequent to provider initiation of CCM services and plan of care.    Today's Visit:  Collaboration with [employer] regarding CPAP supplies and maintenance.      Goals Addressed             This Visit's Progress    Goal: CCM (HF) Expected Outcome: Monitor, Self-Manage and Reduce Symptoms of Heart Failure       Current Barriers:  Chronic Disease Management support and education needs related to Heart Failure  Planned Interventions: Reviewed plan for CHF management. Reports taking medications as prescribed. Reviewed weight parameters and reviewed indications for notifying a provider. Reports weighing as advised. Denies weight gain greater than 3 lbs overnight or 5 lbs within a week. Reports current weight of 230 lbs.  Reviewed s/sx of fluid overload and indications for notifying a provider. Denies recent abdominal or lower extremity edema. Currently experiencing a non-productive cough. Continues to experience shortness of breath with mild-moderate activity. Denies shortness of breath at rest. Discussed importance of assessing symptoms daily and notifying the clinic or Cardiology team with changes. Reviewed nutritional intake. Advised to continue monitoring sodium intake and adhere to fluid restrictions if instructed by the Cardiology team. Reviewed worsening s/sx related to CHF exacerbation and indications for seeking immediate medical attention. Update 07/28/22: No Changes  Symptom Management: Take medications as prescribed   Attend all scheduled provider appointments Call pharmacy for medication refills 3-7 days in advance of running out of medications Call provider  office for new concerns or questions  Call office if I gain more than 3 pounds in one day or 5 pounds in one week Monitor for swelling in feet, ankles and legs every day Keep legs up while sitting Maintain a log with daily weights Monitor sodium intake. Adjust/Limit fluid intake as advised by the Cardiology team Seek medical attention if symptoms worsen.  Follow Up Plan:   Will follow up  next month.       Goal: CCM (Hypertension) Expected Outcome: Monitor, Self-Manage and Reduce Symptoms of Hypertension       Current Barriers:  Chronic Disease Management support and education needs related to Hypertension Management  Planned Interventions: Reviewed plan for hypertension management. Reviewed medications. Reports taking medications as prescribed. Denies concerns r/t current regimen.  Reviewed established blood pressure parameters. Reports not monitoring since last outreach. Strongly advised to monitor a few times a week if unable to monitor daily.  Reviewed symptoms. Denies chest pain or palpitations. Denies headaches, dizziness, or visual changes. Reviewed activity tolerance. Exercising remains difficult d/t limited mobility r/t joint pain. Reports completing outreach with [facility] and receiving steroid injection for pain. Reports mobility remains somewhat difficult. Scheduled for follow up with the Ortho team on 07/28/22. Reviewed safety and fall prevention measures. Reviewed complications of uncontrolled blood pressure.  Reviewed s/sx of heart attack, stroke and worsening symptoms that require immediate medical attention. Update 07/28/22: [name] previously reports air leakage from connection hose. Does not recall agency coming out to complete routine maintenance since device was received. He is not currently using the device d/t malfunction parts. Contacted [employer] regarding CPAP equipment. Requested maintenance for CPAP to determine  if device is functioning properly.  Agency staff confirmed equipment currently on file but not able to recall maintenance date or availability of staff to complete visit with patient regarding updated CPAP maintenance. Unable to confirm if replacement tubing and supplies could be mailed out. Staff indicated need to speak with [name] directly to determine options. Will follow up with [name] within the next month to confirm CPAP maintenance and receipt of needed supplies. Reviewed worsening s/sx related to heart failure complications that require immediate medical attention.   Symptom Management: Take medications as prescribed   Attend all scheduled provider appointments Call pharmacy for medication refills 3-7 days in advance of running out of medications Call provider office for new concerns or questions  Check blood pressure daily Keep a blood pressure log Call doctor for signs and symptoms of high blood pressure Attempt to engage in low impact activity/exercise as tolerated Report new symptoms to your doctor Monitor sodium intake. Increase intake of fruits and vegetable. Avoid highly processed foods when possible.  Follow Up Plan:  Will follow up next month          PLAN: Will follow up next month   [place] Manager/Chronic Care Management [phone]    I have reviewed and agree with [facility] documentation.  [name], MD

## 2022-08-21 ENCOUNTER — Ambulatory Visit: Payer: Medicare HMO | Admitting: Internal Medicine

## 2022-08-21 NOTE — Progress Notes (Deleted)
[name]., male    DOB: [birth year],   MRN: [id number]   Brief patient profile:  Seen by [name] 2014 :  OSA  > stopped cpap on his own   25 yowm quit smoking 10/31/20 referred to pulmonary clinic 11/07/2020 by Dr  [name] for doe/cough/ hoarseness x ??? (pt unable to say)  While on ACEi chronically    History of Present Illness  11/07/2020  Pulmonary/ 1st office eval/[name]  Voice and swallowing s/p ST eval 09/13/2020 mild risk asp Chief Complaint  Patient presents with   Pulmonary Consult    Referred by Dr [name]. He states he has been told that he had COPD.   Dyspnea:  hc parking but can do even large store if walks slowly   Cough: assoc with choking / strangling / also blacked out from coughing 6 weeks prior to [name] prior to symbicort  With neg eval by [name] 09/03/20   Sleep: on side bed is flat with pillows  SABA use: not sure helping 02 :  3lpm hs/ not using daytime  Rec Change protonix (pantoprazole)  to 40 mg Take 30- 60 min before your first and last meals of the day  GERD diet reviewed, bed blocks rec  Stop lisinopril and start diovan 80 mg one daily  Plan A = Automatic = Always=   Change symbicort 160 Take 1 puffs first thing in am and then another 2 puffs about 12 hours later.  Work on inhaler technique:  Plan B = Backup (to supplement plan A, not to replace it) Only use your albuterol inhaler as a rescue medication Please schedule a follow up office visit in 6 weeks, call sooner if needed with all medications /inhalers/ solutions in hand so we can verify exactly what you are taking. This includes all medications from all doctors and over the counters  - PFTs on return - don't take the symbicort that day       03/31/2021  f/u ov/[name] re: ? Acei case    maint on nothing   Chief Complaint  Patient presents with   Follow-up    PFT done today. He states his breathing is doing well and denies any new co's. He states he does not really use his symbicort and he does not have  any other inhaler.    Dyspnea:  walks around [employer] ok  Cough: gone off acei, no more choking or presyncope off acei  Sleeping: bed is flat/ 2pillows s am cough SABA use: none  02: none  Covid status:   x 3 vax / infected ? Omicron Rec Ok to use symbicort 160 up to 2 puffs every 12 hours as needed for breathing or coughing problems    08/21/2022  f/u ov/[name]/[name] re: ***   maint on ***  No chief complaint on file.   Dyspnea:  *** Cough: *** Sleeping: *** SABA use: *** 02: *** Covid status:   *** Lung cancer screening :  ***    No obvious day to day or daytime variability or assoc excess/ purulent sputum or mucus plugs or hemoptysis or cp or chest tightness, subjective wheeze or overt sinus or hb symptoms.   *** without nocturnal  or early am exacerbation  of respiratory  c/o's or need for noct saba. Also denies any obvious fluctuation of symptoms with weather or environmental changes or other aggravating or alleviating factors except as outlined above   No unusual exposure hx or h/o childhood pna/ asthma  or knowledge of premature birth.  Current Allergies, Complete Past Medical History, Past Surgical History, Family History, and Social History were reviewed in [employer] record.  ROS  The following are not active complaints unless bolded Hoarseness, sore throat, dysphagia, dental problems, itching, sneezing,  nasal congestion or discharge of excess mucus or purulent secretions, ear ache,   fever, chills, sweats, unintended wt loss or wt gain, classically pleuritic or exertional cp,  orthopnea pnd or arm/hand swelling  or leg swelling, presyncope, palpitations, abdominal pain, anorexia, nausea, vomiting, diarrhea  or change in bowel habits or change in bladder habits, change in stools or change in urine, dysuria, hematuria,  rash, arthralgias, visual complaints, headache, numbness, weakness or ataxia or problems with walking or coordination,  change in  mood or  memory.        No outpatient medications have been marked as taking for the 08/21/22 encounter (Appointment) with Kenneth Rockers, MD.         Past Medical History:  Diagnosis Date   Abnormality of gait    Anxiety    CAD (coronary artery disease)    a. 10/2008 Inferolateral STEMI: 3VD w/ occluded LCX (PTCA)-->CABG x 3 (LIMA->LAD, VG->OM, VG->PDA);  b. 06/2009 NSTEMI/PCI: VG->OM 100 (BMS);  c. 05/2013 native 3VD, 3/3 patent grafts. d. 06/2016: Inferior STEMI w/ occluded SVG-OM (DES placed). LIMA-LAD and SVG-PDA patent.   Chronic bronchitis (HCC)    Chronic diastolic CHF (congestive heart failure) ([place])    a. 02/2015 Echo: EF 50-55%, distal septal, apical, inferobasal HK, mild LVH, mild MR, mildly dil LA.   Cocaine use    Colon polyps    a. 09/2015 colonscopy: multiple sessile polyps, path negative for high grade dysplasia.   Depression    Emphysema (subcutaneous) (surgical) resulting from a procedure 10/2008.   Bleb resected at time of 10/2008 CABG. Marketed emphysema noted on surgical report.   Esophagitis    a. 2017 EGD: esophagitis, duodenitis.   ETOH abuse    GERD (gastroesophageal reflux disease)    Hemianopia, homonymous, right    "can't see out the right side of either eye since stroke in 2016/pt description 09/23/2016   Hepatic steatosis    Hiatal hernia    History of nuclear stress test 08/2017   Nuclear stress test 1/19: EF 35, inf-lat and ant-lat, apical sept/apical scar; no ischemia; Intermediate Risk   Hyperlipidemia    Hypertension    Iron deficiency anemia 2010   Morbid obesity ([place])    Myocardial infarction [facility]) "several"   PAD (peripheral artery disease) ([place])    Pancreatitis 06/2015   Sleep apnea    "don't have a mask" (09/23/2016)   Stroke ([place]) 02/2015   a. 02/2015 Carotid U/S: 1-39% bilat ICA stenosis; "left me blind" (09/23/2016)   Tobacco abuse    still smokes   Tubular adenoma of colon        Objective:    wts   08/21/2022       ***    03/10/21 217 lb 3.2 oz (98.5 kg)  01/28/21 218 lb (98.9 kg)  01/28/21 218 lb (98.9 kg)    Vital signs reviewed  08/21/2022  - Note at rest 02 sats  ***% on ***   General appearance:    ***             Assessment

## 2022-08-24 ENCOUNTER — Other Ambulatory Visit: Payer: Self-pay | Admitting: Family

## 2022-08-27 DIAGNOSIS — Z8673 Personal history of transient ischemic attack (TIA), and cerebral infarction without residual deficits: Secondary | ICD-10-CM | POA: Diagnosis not present

## 2022-08-27 DIAGNOSIS — M5416 Radiculopathy, lumbar region: Secondary | ICD-10-CM | POA: Diagnosis not present

## 2022-08-27 DIAGNOSIS — Z5181 Encounter for therapeutic drug level monitoring: Secondary | ICD-10-CM | POA: Diagnosis not present

## 2022-08-27 DIAGNOSIS — M5126 Other intervertebral disc displacement, lumbar region: Secondary | ICD-10-CM | POA: Diagnosis not present

## 2022-08-27 NOTE — Progress Notes (Signed)
Office Visit    Patient Name: [name]. Date of Encounter: 08/27/2022  Primary Care Provider:  Colon Branch, MD Primary Cardiologist:  Kenneth Chandler, MD Primary Electrophysiologist: None  Chief Complaint    [name]. is a 62 y.o. male with PMH of CAD s/p CABG (LIMA to LAD, SVG to Circumflex, SVG to PDA) 2010, lateral STEMI 2017 with BMS to SVG to OM x 1, HLD, HTN, HFpEF, DM type II, OSA (on CPAP), IDA, CVA 2016, polysubstance (cocaine/tobacco/EtOH), PDA Right SFA s/p arthrectomy and drug-coated balloon angioplasty with stenting who presents today for follow-up of coronary artery disease.  Past Medical History    Past Medical History:  Diagnosis Date   (HFpEF) heart failure with preserved ejection fraction ([place])    Echo 6/22: EF 55-60, no RWMA, GR 3 DD, elevated LVEDP, normal RVSF, RVSP 53.2, mild to moderate AV sclerosis without stenosis, dilated aortic root (41 mm), ascending aorta 37 mm   Abnormality of gait    Anxiety    CAD (coronary artery disease)    a. 10/2008 Inferolateral STEMI: 3VD w/ occluded LCX (PTCA)-->CABG x 3 (LIMA->LAD, VG->OM, VG->PDA);  b. 06/2009 NSTEMI/PCI: VG->OM 100 (BMS);  c. 05/2013 native 3VD, 3/3 patent grafts. d. 06/2016: Inferior STEMI w/ occluded SVG-OM (DES placed). LIMA-LAD and SVG-PDA patent.   Chronic bronchitis (HCC)    Chronic diastolic CHF (congestive heart failure) ([place])    a. 02/2015 Echo: EF 50-55%, distal septal, apical, inferobasal HK, mild LVH, mild MR, mildly dil LA.   Cocaine use    Kenneth polyps    a. 09/2015 colonscopy: multiple sessile polyps, path negative for high grade dysplasia.   Depression    Emphysema (subcutaneous) (surgical) resulting from a procedure 10/2008.   Bleb resected at time of 10/2008 CABG. Marketed emphysema noted on surgical report.   Esophagitis    a. 2017 EGD: esophagitis, duodenitis.   ETOH abuse    GERD (gastroesophageal reflux disease)    Hemianopia, homonymous, right    "can't  see out the right side of either eye since stroke in 2016/pt description 09/23/2016   Hepatic steatosis    Hiatal hernia    History of nuclear stress test 08/2017   Nuclear stress test 1/19: EF 35, inf-lat and ant-lat, apical sept/apical scar; no ischemia; Intermediate Risk   Hyperlipidemia    Hypertension    Iron deficiency anemia 2010   Morbid obesity ([place])    Myocardial infarction [facility]) "several"   PAD (peripheral artery disease) ([place])    Pancreatitis 06/2015   Sleep apnea    "don't have a mask" (09/23/2016)   Stroke ([place]) 02/2015   a. 02/2015 Carotid U/S: 1-39% bilat ICA stenosis; "left me blind" (09/23/2016)   Tobacco abuse    still smokes   Tubular adenoma of Kenneth    Past Surgical History:  Procedure Laterality Date   ABDOMINAL AORTOGRAM W/LOWER EXTREMITY N/A 09/23/2016   Procedure: Abdominal Aortogram w/Lower Extremity;  Surgeon: Kenneth Hampshire, MD;  Location: [place] [facility];  Service: Cardiovascular;  Laterality: N/A;   ABDOMINAL AORTOGRAM W/LOWER EXTREMITY N/A 02/18/2022   Procedure: ABDOMINAL AORTOGRAM W/LOWER EXTREMITY;  Surgeon: Kenneth Hampshire, MD;  Location: [place] [facility];  Service: Cardiovascular;  Laterality: N/A;   CARDIAC CATHETERIZATION  11/07/2008   EF of 50-55% -- normal LV systolic function, acute inferolateral ST elevation MI,  PTCA of the circumflex artery -- [name]. [name], [name]   CARDIAC CATHETERIZATION N/A 07/07/2016  Procedure: Left Heart Cath and Cors/Grafts Angiography;  Surgeon: Kenneth M Martinique, MD;  Location: [place] [facility];  Service: Cardiovascular;  Laterality: N/A;   CARDIAC CATHETERIZATION N/A 07/07/2016   Procedure: Coronary Stent Intervention;  Surgeon: Kenneth M Martinique, MD;  Location: [place] [facility];  Service: Cardiovascular;  Laterality: N/A;   CORONARY ANGIOPLASTY     CORONARY ARTERY BYPASS GRAFT     "CABG X4"   LEFT HEART CATH AND CORS/GRAFTS ANGIOGRAPHY N/A 10/01/2017   Procedure: LEFT HEART CATH AND CORS/GRAFTS  ANGIOGRAPHY;  Surgeon: Kenneth Blanks, MD;  Location: [place] [facility];  Service: Cardiovascular;  Laterality: N/A;   LEFT HEART CATHETERIZATION WITH CORONARY/GRAFT ANGIOGRAM N/A 05/15/2013   Procedure: LEFT HEART CATHETERIZATION WITH Beatrix Fetters;  Surgeon: Kenneth M Martinique, MD;  Location: [facility];  Service: Cardiovascular;  Laterality: N/A;   PERIPHERAL VASCULAR ATHERECTOMY  02/18/2022   Procedure: PERIPHERAL VASCULAR ATHERECTOMY;  Surgeon: Kenneth Hampshire, MD;  Location: [place] [facility];  Service: Cardiovascular;;   PERIPHERAL VASCULAR INTERVENTION  09/23/2016   Procedure: Peripheral Vascular Intervention;  Surgeon: Kenneth Hampshire, MD;  Location: [place] [facility];  Service: Cardiovascular;;  Lt. SFA    Allergies  Allergies  Allergen Reactions   Metformin And Related Diarrhea and Other (See Comments)    Severe diarrhea   Wellbutrin [Bupropion] Nausea Only    History of Present Illness    [name].  is a 62 year old male with the above mention past medical history who presents today for follow-up of coronary artery disease.  [name] presented in 2010 with inferior lateral STEMI and found to have three-vessel disease and underwent emergent CABG (LIMA to LAD, SVG to Circumflex, SVG to PDA).  He presented 06/2009 with NSTEMI and had BMS placed to totally occluded saphenous vein graft to obtuse marginal artery.  He suffered an acute ischemic CVA on 02/2015 and carotid Dopplers completed with mild bilateral carotid disease noted.  He was admitted 07/07/2016 with lateral STEMI secondary to acute occlusion of the SVG to the OM that was treated with DES x 1.He had an acute ischemic CVA in April 2020. Echo April 2020 with LVEF=60-65%. He refused rehab and signed out AMA. He had volume overload in May 2022 and was started on Lasix. Echo June 2022 with LVEF=55-60% with grade 3 diastolic dysfunction. RVSP estimated at 53 mmHg. No valve disease.  He was last seen  by [name] 09/15/2021 and was overall doing well but continues to smoke.  He did report bradycardia and dizziness and Lopressor was discontinued.  He was euvolemic on exam with stable weights and blood pressure was controlled.  He was admitted to the ED on 12/23/2021 with complaint of dyspnea and lower extremity edema.  He was diuresed with Lasix and developed acute renal failure during admission with resolution prior to discharge.  2D echo was completed showing EF of 55-60% with mild LVH and RV systolic function preserved.  He was seen by Dr. Fletcher Anon on 01/13/2022 for follow-up.  He reported severe leg claudication and had no palpable pulse distally.  ABI and duplex were arranged for further evaluation and patient had severe stenosis in the right mid SFA.  He underwent abdominal aortogram with directional arthrectomy and drug-coated balloon angioplasty to the right SFA.  He was seen in follow-up by Dr. Fletcher Anon with continued right leg claudication.  Mr. Postiglione presents today for preoperative clearance visit alone.  Since last being seen in  the office patient reports that he is still having episodes of dizziness but has improved since gabapentin was discontinued.  He reports that he is able to do physical activity without increased chest pain.  He is still smoking half a pack per day which is down from 3 packs/day.  He reports that he is compliant with his current medication regimen and denies any additional adverse reactions.  His blood pressure today was 108/76 and heart rate was 53 bpm.  He is still being followed by pulmonology for COPD.  He was recently evaluated by orthopedic surgery and he is planning to undergo a series of lower back injections prior to considering lumbar surgery.  Patient denies chest pain, palpitations, dyspnea, PND, orthopnea, nausea, vomiting, dizziness, syncope, edema, weight gain, or early satiety.  Home Medications    Current Outpatient Medications  Medication Sig Dispense Refill    aspirin EC 81 MG EC tablet Take 1 tablet (81 mg total) by mouth daily with breakfast. 30 tablet 2   atorvastatin (LIPITOR) 80 MG tablet Take 1 tablet (80 mg total) by mouth daily at 6 PM. 90 tablet 2   augmented betamethasone dipropionate (DIPROLENE-AF) 0.05 % cream Apply topically 2 (two) times daily. 50 g 1   Blood Glucose Monitoring Suppl (ONETOUCH VERIO REFLECT) w/Device KIT Use to check blood glucose daily (DX: type 2 DM - E 11.51) (Patient not taking: Reported on 05/11/2022) 1 kit 0   budesonide-formoterol (SYMBICORT) 160-4.5 MCG/ACT inhaler Inhale 2 puffs into the lungs 2 (two) times daily. 30 g 1   Cholecalciferol (VITAMIN D-3) 125 MCG (5000 UT) TABS Take 5,000 Units by mouth daily.     clopidogrel (PLAVIX) 75 MG tablet TAKE 1 TABLET EVERY DAY 90 tablet 3   Cyanocobalamin (VITAMIN B-12) 5000 MCG SUBL Place 5,000 mcg under the tongue daily.     cyclobenzaprine (FLEXERIL) 10 MG tablet Take 10 mg by mouth 3 (three) times daily as needed.     dapagliflozin propanediol (FARXIGA) 5 MG TABS tablet Take 1 tablet (5 mg total) by mouth daily before breakfast. Replaces Jardiance '10mg'$  90 tablet 1   diclofenac Sodium (VOLTAREN) 1 % GEL Apply 1 application  topically daily as needed for pain.     ezetimibe (ZETIA) 10 MG tablet Take 1 tablet (10 mg total) by mouth daily. 90 tablet 3   fenofibrate (TRICOR) 145 MG tablet Take 1 tablet (145 mg total) by mouth daily. 90 tablet 1   folic acid (FOLVITE) 1 MG tablet Take 1 tablet (1 mg total) by mouth daily. 90 tablet 3   furosemide (LASIX) 20 MG tablet TAKE 1 TO 2 TABLETS EVERY DAY IN THE MORNING 180 tablet 0   glucose blood (ONETOUCH VERIO) test strip Use to check blood glucose once a day (DX: 11.51 - type 2 DM) 100 each 4   isosorbide mononitrate (IMDUR) 60 MG 24 hr tablet TAKE 1 AND 1/2 TABLETS EVERY DAY 135 tablet 2   meloxicam (MOBIC) 15 MG tablet Take 15 mg by mouth daily.     nicotine (NICOTINE STEP 1) 21 mg/24hr patch Place 1 patch (21 mg total) onto  the skin daily. 28 patch 0   nitroGLYCERIN (NITROSTAT) 0.4 MG SL tablet Place 1 tablet (0.4 mg total) under the tongue every 5 (five) minutes x 3 doses as needed for chest pain. 25 tablet 2   OneTouch Delica Lancets 09B MISC Use to check blood glucose once a day (DX: E11.51 type 2 DM) (Patient not taking: Reported on 05/11/2022)  100 each 4   OXYGEN Inhale 2-4 L/min into the lungs as needed (for shortness of breath).     pantoprazole (PROTONIX) 40 MG tablet TAKE 1 TABLET TWICE DAILY 180 tablet 3   pregabalin (LYRICA) 75 MG capsule Take 75 mg by mouth 3 (three) times daily as needed.     PRESCRIPTION MEDICATION CPAP- At bedtime     spironolactone (ALDACTONE) 25 MG tablet TAKE 1/2 TABLET EVERY DAY 45 tablet 2   tamsulosin (FLOMAX) 0.4 MG CAPS capsule TAKE 1 CAPSULE BY MOUTH IN THE MORNING AND AT BEDTIME. 180 capsule 1   thiamine 100 MG tablet Take 1 tablet (100 mg total) by mouth daily. 90 tablet 3   traZODone (DESYREL) 100 MG tablet Take 1 tablet (100 mg total) by mouth at bedtime. 90 tablet 1   valsartan (DIOVAN) 40 MG tablet Take 1 tablet (40 mg total) by mouth daily. 90 tablet 1   No current facility-administered medications for this visit.     Review of Systems  Please see the history of present illness.    (+) Dizziness (+) Lower back pain  All other systems reviewed and are otherwise negative except as noted above.  Physical Exam    Wt Readings from Last 3 Encounters:  04/29/22 232 lb 8 oz (105.5 kg)  04/07/22 229 lb 3.2 oz (104 kg)  02/18/22 235 lb (106.6 kg)   QH:UTMLY were no vitals filed for this visit.,There is no height or weight on file to calculate BMI.  Constitutional:      Appearance: Healthy appearance. Not in distress.  Neck:     Vascular: JVD normal.  Pulmonary:     Effort: Pulmonary effort is normal.     Breath sounds: No wheezing. No rales. Diminished in the bases Cardiovascular:     Normal rate. Regular rhythm. Normal S1. Normal S2.      Murmurs: There is  no murmur.  Edema:    Peripheral edema trace present in lower extremities bilaterally Abdominal:     Palpations: Abdomen is soft non tender. There is no hepatomegaly.  Skin:    General: Skin is warm and dry.  Neurological:     General: No focal deficit present.     Mental Status: Alert and oriented to person, place and time.     Cranial Nerves: Cranial nerves are intact.  EKG/LABS/Other Studies Reviewed    ECG personally reviewed by me today -sinus bradycardia with right bundle [name] block and TWI in leads III, V2, with rate of 57 bpm and no acute changes consistent with previous EKG.  Lab Results  Component Value Date   WBC 7.8 02/05/2022   HGB 15.4 02/05/2022   HCT 45.2 02/05/2022   MCV 92 02/05/2022   PLT 206 02/05/2022   Lab Results  Component Value Date   CREATININE 1.10 04/29/2022   BUN 18 04/29/2022   NA 137 04/29/2022   K 4.2 04/29/2022   CL 99 04/29/2022   CO2 29 04/29/2022   Lab Results  Component Value Date   ALT 10 04/29/2022   AST 12 04/29/2022   ALKPHOS 66 04/11/2021   BILITOT 1.5 (H) 04/11/2021   Lab Results  Component Value Date   CHOL 127 04/29/2022   HDL 47.20 04/29/2022   LDLCALC 60 04/29/2022   LDLDIRECT 116.0 05/12/2013   TRIG 101.0 04/29/2022   CHOLHDL 3 04/29/2022    Lab Results  Component Value Date   HGBA1C 6.8 (H) 04/30/2022    Assessment & Plan  1.  Surgical clearance: -   Mr. Vanness perioperative risk of a major cardiac event is 11% according to the Revised Cardiac Risk Index (RCRI).  Therefore, he is at high risk for perioperative complications.   His functional capacity is poor at 4.73 METs according to the Duke Activity Status Index (DASI).  -We will obtain Lexiscan Myoview to further stratify patient is risk for possible lumbar surgery.  We will reach out to [name] regarding guidance for holding antiplatelet therapy due to extensive cardiac history.  No clearance has been determined and addendum will be made pending  results of Lexiscan Myoview and parameters for holding DAPT.   2.  Coronary artery disease: -s/p ABG (LIMA to LAD, SVG to Circumflex, SVG to PDA) 2010, lateral STEMI 2017 with BMS to SVG to OM x 1 -Today patient reports no additional chest pain but does still report occasional bouts of dizziness. -Continue GDMT with Plavix 75 mg, ASA 81 mg, atorvastatin 80 mg, fenofibrate 145 mg, ezetimibe 10 mg, Imdur 90 mg daily  3.  Chronic diastolic CHF: -Patient's volume today is euvolemic with trace lower extremity edema today. -Most recent 2D echo completed with EF of 55-60% -Continue GDMT with valsartan 40 mg, Aldactone 12.5 mg, Lasix 20 mg, Farxiga 5 mg  4.  Essential hypertension: -Patient's blood pressure today was well-controlled at 108/76 -Continue spironolactone 12.5 mg and valsartan 40 mg daily  5.  Hyperlipidemia: -Patient's last LDL cholesterol was 60 at goal of less than 70 -Continue atorvastatin 80 mg, fenofibrate 145 mg, Zetia 10 mg  6.  Peripheral artery disease: -s/p previous endovascular intervention on the left SFA and most recent successful directional atherectomy and drug-coated balloon angioplasty of right SFA with recurrent claudication -Today patient reports that he is pending a follow-up lower extremity arteriogram in 6 months. -Continue atorvastatin, fenofibrate, Zetia, Plavix 75 mg, and ASA 81 mg  7.  Dizziness/bradycardia: -Patient reported dizziness and bradycardia and metoprolol was discontinued. -Today patient reports that his dizziness has improved since discontinuing gabapentin.  Disposition: Follow-up with Kenneth Chandler, MD or APP in 3 months  Shared Decision Making/Informed Consent The risks [chest pain, shortness of breath, cardiac arrhythmias, dizziness, blood pressure fluctuations, myocardial infarction, stroke/transient ischemic attack, nausea, vomiting, allergic reaction, radiation exposure, metallic taste sensation and life-threatening  complications (estimated to be 1 in 10,000)], benefits (risk stratification, diagnosing coronary artery disease, treatment guidance) and alternatives of a nuclear stress test were discussed in detail with Mr. Foss and he agrees to proceed.   Medication Adjustments/Labs and Tests Ordered: Current medicines are reviewed at length with the patient today.  Concerns regarding medicines are outlined above.   Signed, Mable Fill, Marissa Nestle, NP 08/27/2022, 3:27 PM [facility]  Note:  This document was prepared using Dragon voice recognition software and may include unintentional dictation errors.

## 2022-08-28 ENCOUNTER — Encounter: Payer: Self-pay | Admitting: Nurse Practitioner

## 2022-08-28 ENCOUNTER — Ambulatory Visit: Payer: Medicare HMO | Attending: Nurse Practitioner | Admitting: Nurse Practitioner

## 2022-08-28 VITALS — BP 108/76 | HR 53 | Ht 71.0 in | Wt 223.8 lb

## 2022-08-28 DIAGNOSIS — Z01810 Encounter for preprocedural cardiovascular examination: Secondary | ICD-10-CM | POA: Diagnosis not present

## 2022-08-28 DIAGNOSIS — I2510 Atherosclerotic heart disease of native coronary artery without angina pectoris: Secondary | ICD-10-CM

## 2022-08-28 DIAGNOSIS — I10 Essential (primary) hypertension: Secondary | ICD-10-CM

## 2022-08-28 DIAGNOSIS — I739 Peripheral vascular disease, unspecified: Secondary | ICD-10-CM

## 2022-08-28 DIAGNOSIS — E782 Mixed hyperlipidemia: Secondary | ICD-10-CM | POA: Diagnosis not present

## 2022-08-28 DIAGNOSIS — I5032 Chronic diastolic (congestive) heart failure: Secondary | ICD-10-CM

## 2022-08-28 DIAGNOSIS — R001 Bradycardia, unspecified: Secondary | ICD-10-CM

## 2022-08-28 NOTE — Patient Instructions (Signed)
Medication Instructions:  Your physician recommends that you continue on your current medications as directed. Please refer to the Current Medication list given to you today. *If you need a refill on your cardiac medications before your next appointment, please call your pharmacy*   Lab Work: None ordered   Testing/Procedures: Your physician has requested that you have a lexiscan myoview. For further information please visit [email or web address]. Please follow instruction sheet, as given.   Follow-Up: At [facility], you and your health needs are our priority.  As part of our continuing mission to provide you with exceptional heart care, we have created designated Provider Care Teams.  These Care Teams include your primary Cardiologist (physician) and Advanced Practice Providers (APPs -  Physician Assistants and Nurse Practitioners) who all work together to provide you with the care you need, when you need it.  We recommend signing up for the patient portal called "MyChart".  Sign up information is provided on this After Visit Summary.  MyChart is used to connect with patients for Virtual Visits (Telemedicine).  Patients are able to view lab/test results, encounter notes, upcoming appointments, etc.  Non-urgent messages can be sent to your provider as well.   To learn more about what you can do with MyChart, go to [email or web address].    Your next appointment:   3 month(s)  Provider:   [name], MD     Other Instructions

## 2022-09-14 NOTE — Progress Notes (Deleted)
[name]., male    DOB: [birth year],   MRN: [id number]   Brief patient profile:  Seen by [name] 2014 :  OSA  > stopped cpap on his own   75 yowm quit smoking 10/31/20 referred to pulmonary clinic 11/07/2020 by Dr  [name] for doe/cough/ hoarseness x ??? (pt unable to say)  While on ACEi chronically    History of Present Illness  11/07/2020  Pulmonary/ 1st office eval/[name]  Voice and swallowing s/p ST eval 09/13/2020 mild risk asp Chief Complaint  Patient presents with   Pulmonary Consult    Referred by Dr [name]. He states he has been told that he had COPD.   Dyspnea:  hc parking but can do even large store if walks slowly   Cough: assoc with choking / strangling / also blacked out from coughing 6 weeks prior to [place] prior to symbicort  With neg eval by [name] 09/03/20   Sleep: on side bed is flat with pillows  SABA use: not sure helping 02 :  3lpm hs/ not using daytime  Rec Change protonix (pantoprazole)  to 40 mg Take 30- 60 min before your first and last meals of the day  GERD diet reviewed, bed blocks rec  Stop lisinopril and start diovan 80 mg one daily  Plan A = Automatic = Always=   Change symbicort 160 Take 1 puffs first thing in am and then another 2 puffs about 12 hours later.  Work on inhaler technique:  Plan B = Backup (to supplement plan A, not to replace it) Only use your albuterol inhaler as a rescue medication Please schedule a follow up office visit in 6 weeks, call sooner if needed with all medications /inhalers/ solutions in hand so we can verify exactly what you are taking. This includes all medications from all doctors and over the counters  - PFTs on return - don't take the symbicort that day       03/31/2021  f/u ov/[name] re: ? Acei case    maint on nothing   Chief Complaint  Patient presents with   Follow-up    PFT done today. He states his breathing is doing well and denies any new co's. He states he does not really use his symbicort and he does  not have any other inhaler.    Dyspnea:  walks around [employer] ok  Cough: gone off acei, no more choking or presyncope off acei  Sleeping: bed is flat/ 2pillows s am cough SABA use: none  02: none  Covid status:   x 3 vax / infected ? Omicron Rec Ok to use symbicort 160 up to 2 puffs every 12 hours as needed for breathing or coughing problems    09/15/2022  f/u ov/[name] re: ***   maint on ***  No chief complaint on file.   Dyspnea:  *** Cough: *** Sleeping: *** SABA use: *** 02: *** Covid status:   *** Lung cancer screening :  ***    No obvious day to day or daytime variability or assoc excess/ purulent sputum or mucus plugs or hemoptysis or cp or chest tightness, subjective wheeze or overt sinus or hb symptoms.   *** without nocturnal  or early am exacerbation  of respiratory  c/o's or need for noct saba. Also denies any obvious fluctuation of symptoms with weather or environmental changes or other aggravating or alleviating factors except as outlined above   No unusual exposure hx  or h/o childhood pna/ asthma or knowledge of premature birth.  Current Allergies, Complete Past Medical History, Past Surgical History, Family History, and Social History were reviewed in [employer] record.  ROS  The following are not active complaints unless bolded Hoarseness, sore throat, dysphagia, dental problems, itching, sneezing,  nasal congestion or discharge of excess mucus or purulent secretions, ear ache,   fever, chills, sweats, unintended wt loss or wt gain, classically pleuritic or exertional cp,  orthopnea pnd or arm/hand swelling  or leg swelling, presyncope, palpitations, abdominal pain, anorexia, nausea, vomiting, diarrhea  or change in bowel habits or change in bladder habits, change in stools or change in urine, dysuria, hematuria,  rash, arthralgias, visual complaints, headache, numbness, weakness or ataxia or problems with walking or coordination,   change in mood or  memory.        No outpatient medications have been marked as taking for the 09/15/22 encounter (Appointment) with Tanda Rockers, MD.         Past Medical History:  Diagnosis Date   Abnormality of gait    Anxiety    CAD (coronary artery disease)    a. 10/2008 Inferolateral STEMI: 3VD w/ occluded LCX (PTCA)-->CABG x 3 (LIMA->LAD, VG->OM, VG->PDA);  b. 06/2009 NSTEMI/PCI: VG->OM 100 (BMS);  c. 05/2013 native 3VD, 3/3 patent grafts. d. 06/2016: Inferior STEMI w/ occluded SVG-OM (DES placed). LIMA-LAD and SVG-PDA patent.   Chronic bronchitis (HCC)    Chronic diastolic CHF (congestive heart failure) ([place])    a. 02/2015 Echo: EF 50-55%, distal septal, apical, inferobasal HK, mild LVH, mild MR, mildly dil LA.   Cocaine use    Colon polyps    a. 09/2015 colonscopy: multiple sessile polyps, path negative for high grade dysplasia.   Depression    Emphysema (subcutaneous) (surgical) resulting from a procedure 10/2008.   Bleb resected at time of 10/2008 CABG. Marketed emphysema noted on surgical report.   Esophagitis    a. 2017 EGD: esophagitis, duodenitis.   ETOH abuse    GERD (gastroesophageal reflux disease)    Hemianopia, homonymous, right    "can't see out the right side of either eye since stroke in 2016/pt description 09/23/2016   Hepatic steatosis    Hiatal hernia    History of nuclear stress test 08/2017   Nuclear stress test 1/19: EF 35, inf-lat and ant-lat, apical sept/apical scar; no ischemia; Intermediate Risk   Hyperlipidemia    Hypertension    Iron deficiency anemia 2010   Morbid obesity ([place])    Myocardial infarction [facility]) "several"   PAD (peripheral artery disease) ([place])    Pancreatitis 06/2015   Sleep apnea    "don't have a mask" (09/23/2016)   Stroke ([place]) 02/2015   a. 02/2015 Carotid U/S: 1-39% bilat ICA stenosis; "left me blind" (09/23/2016)   Tobacco abuse    still smokes   Tubular adenoma of colon        Objective:    wts   09/15/2022       ***    03/10/21 217 lb 3.2 oz (98.5 kg)  01/28/21 218 lb (98.9 kg)  01/28/21 218 lb (98.9 kg)    Vital signs reviewed  09/15/2022  - Note at rest 02 sats  ***% on ***   General appearance:    ***             Assessment

## 2022-09-15 ENCOUNTER — Ambulatory Visit: Payer: Medicare HMO | Admitting: Internal Medicine

## 2022-09-17 ENCOUNTER — Telehealth (HOSPITAL_COMMUNITY): Payer: Self-pay | Admitting: *Deleted

## 2022-09-17 DIAGNOSIS — M5416 Radiculopathy, lumbar region: Secondary | ICD-10-CM | POA: Diagnosis not present

## 2022-09-17 NOTE — Telephone Encounter (Signed)
Patient given detailed instructions per Myocardial Perfusion Study Information Sheet for the test on 09/21/22 at 12:45. Patient notified to arrive 15 minutes early and that it is imperative to arrive on time for appointment to keep from having the test rescheduled.  If you need to cancel or reschedule your appointment, please call the office within 24 hours of your appointment. . Patient verbalized understanding.[name]

## 2022-09-21 ENCOUNTER — Encounter (HOSPITAL_COMMUNITY): Payer: Medicare HMO

## 2022-10-05 ENCOUNTER — Encounter (HOSPITAL_COMMUNITY): Payer: Medicare HMO

## 2022-10-06 ENCOUNTER — Ambulatory Visit (HOSPITAL_COMMUNITY): Payer: Medicare HMO | Attending: Cardiology

## 2022-10-06 DIAGNOSIS — I5032 Chronic diastolic (congestive) heart failure: Secondary | ICD-10-CM | POA: Diagnosis not present

## 2022-10-06 DIAGNOSIS — I739 Peripheral vascular disease, unspecified: Secondary | ICD-10-CM | POA: Diagnosis not present

## 2022-10-06 DIAGNOSIS — I10 Essential (primary) hypertension: Secondary | ICD-10-CM | POA: Diagnosis not present

## 2022-10-06 DIAGNOSIS — Z01810 Encounter for preprocedural cardiovascular examination: Secondary | ICD-10-CM | POA: Diagnosis not present

## 2022-10-06 DIAGNOSIS — I2510 Atherosclerotic heart disease of native coronary artery without angina pectoris: Secondary | ICD-10-CM | POA: Diagnosis not present

## 2022-10-06 DIAGNOSIS — E782 Mixed hyperlipidemia: Secondary | ICD-10-CM | POA: Diagnosis not present

## 2022-10-06 DIAGNOSIS — R001 Bradycardia, unspecified: Secondary | ICD-10-CM | POA: Diagnosis not present

## 2022-10-06 LAB — MYOCARDIAL PERFUSION IMAGING
LV dias vol: 290 mL (ref 62–150)
LV sys vol: 208 mL
Nuc Stress EF: 28 %
Peak HR: 94 {beats}/min
Rest HR: 52 {beats}/min
Rest Nuclear Isotope Dose: 10.2 mCi
SDS: 0
SRS: 10
SSS: 10
ST Depression (mm): 0 mm
Stress Nuclear Isotope Dose: 31.7 mCi
TID: 1.03

## 2022-10-06 MED ORDER — TECHNETIUM TC 99M TETROFOSMIN IV KIT
31.7000 | PACK | Freq: Once | INTRAVENOUS | Status: AC | PRN
  Administered 2022-10-06: 31.7 via INTRAVENOUS

## 2022-10-06 MED ORDER — TECHNETIUM TC 99M TETROFOSMIN IV KIT
10.2000 | PACK | Freq: Once | INTRAVENOUS | Status: AC | PRN
  Administered 2022-10-06: 10.2 via INTRAVENOUS

## 2022-10-06 MED ORDER — REGADENOSON 0.4 MG/5ML IV SOLN
0.4000 mg | Freq: Once | INTRAVENOUS | Status: AC
  Administered 2022-10-06: 0.4 mg via INTRAVENOUS

## 2022-10-07 ENCOUNTER — Other Ambulatory Visit: Payer: Self-pay

## 2022-10-07 ENCOUNTER — Telehealth: Payer: Self-pay | Admitting: Cardiovascular Disease

## 2022-10-07 DIAGNOSIS — I5032 Chronic diastolic (congestive) heart failure: Secondary | ICD-10-CM

## 2022-10-07 NOTE — Telephone Encounter (Signed)
Patient given test results and voiced understanding.   Echo ordered. Patient aware a scheduler will contact him to schedule an appointment.

## 2022-10-07 NOTE — Telephone Encounter (Signed)
Pt is returning call in regards to results. Transferred to [place], [place].

## 2022-10-11 NOTE — Progress Notes (Deleted)
[name]., male    DOB: April 07, 1961,   MRN: [id number]   Brief patient profile:  Seen by [name] 2014 :  OSA  > stopped cpap on his own   11  yowm quit smoking 10/31/20 referred to pulmonary clinic 11/07/2020 by Dr  [name] for doe/cough/ hoarseness x ??? (pt unable to say)  While on ACEi chronically    History of Present Illness  11/07/2020  Pulmonary/ 1st office eval/Jazmen Lindenbaum  Voice and swallowing s/p ST eval 09/13/2020 mild risk asp Chief Complaint  Patient presents with   Pulmonary Consult    Referred by Dr [name]. He states he has been told that he had COPD.   Dyspnea:  hc parking but can do even large store if walks slowly   Cough: assoc with choking / strangling / also blacked out from coughing 6 weeks prior to [place] prior to symbicort  With neg eval by [name] 09/03/20   Sleep: on side bed is flat with pillows  SABA use: not sure helping 02 :  3lpm hs/ not using daytime  Rec Change protonix (pantoprazole)  to 40 mg Take 30- 60 min before your first and last meals of the day  GERD diet reviewed, bed blocks rec  Stop lisinopril and start diovan 80 mg one daily  Plan A = Automatic = Always=   Change symbicort 160 Take 1 puffs first thing in am and then another 2 puffs about 12 hours later.  Work on inhaler technique:  Plan B = Backup (to supplement plan A, not to replace it) Only use your albuterol inhaler as a rescue medication Please schedule a follow up office visit in 6 weeks, call sooner if needed with all medications /inhalers/ solutions in hand so we can verify exactly what you are taking. This includes all medications from all doctors and over the counters  - PFTs on return - don't take the symbicort that day       03/31/2021  f/u ov/Devean Skoczylas re: ? Acei case    maint on nothing   Chief Complaint  Patient presents with   Follow-up    PFT done today. He states his breathing is doing well and denies any new co's. He states he does not really use his symbicort and he does  not have any other inhaler.    Dyspnea:  walks around [employer] ok  Cough: gone off acei, no more choking or presyncope off acei  Sleeping: bed is flat/ 2pillows s am cough SABA use: none  02: none  Covid status:   x 3 vax / infected ? Omicron Rec Ok to use symbicort 160 up to 2 puffs every 12 hours as needed for breathing or coughing problems    10/12/2022  f/u ov/[place]/Uchenna Rappaport re: ***   maint on ***  No chief complaint on file.   Dyspnea:  *** Cough: *** Sleeping: *** SABA use: *** 02: *** Covid status:   *** Lung cancer screening :  ***    No obvious day to day or daytime variability or assoc excess/ purulent sputum or mucus plugs or hemoptysis or cp or chest tightness, subjective wheeze or overt sinus or hb symptoms.   *** without nocturnal  or early am exacerbation  of respiratory  c/o's or need for noct saba. Also denies any obvious fluctuation of symptoms with weather or environmental changes or other aggravating or alleviating factors except as outlined above   No unusual exposure  hx or h/o childhood pna/ asthma or knowledge of premature birth.  Current Allergies, Complete Past Medical History, Past Surgical History, Family History, and Social History were reviewed in [employer] record.  ROS  The following are not active complaints unless bolded Hoarseness, sore throat, dysphagia, dental problems, itching, sneezing,  nasal congestion or discharge of excess mucus or purulent secretions, ear ache,   fever, chills, sweats, unintended wt loss or wt gain, classically pleuritic or exertional cp,  orthopnea pnd or arm/hand swelling  or leg swelling, presyncope, palpitations, abdominal pain, anorexia, nausea, vomiting, diarrhea  or change in bowel habits or change in bladder habits, change in stools or change in urine, dysuria, hematuria,  rash, arthralgias, visual complaints, headache, numbness, weakness or ataxia or problems with walking or coordination,   change in mood or  memory.        No outpatient medications have been marked as taking for the 10/12/22 encounter (Appointment) with Tanda Rockers, MD.         Past Medical History:  Diagnosis Date   Abnormality of gait    Anxiety    CAD (coronary artery disease)    a. 10/2008 Inferolateral STEMI: 3VD w/ occluded LCX (PTCA)-->CABG x 3 (LIMA->LAD, VG->OM, VG->PDA);  b. 06/2009 NSTEMI/PCI: VG->OM 100 (BMS);  c. 05/2013 native 3VD, 3/3 patent grafts. d. 06/2016: Inferior STEMI w/ occluded SVG-OM (DES placed). LIMA-LAD and SVG-PDA patent.   Chronic bronchitis (HCC)    Chronic diastolic CHF (congestive heart failure) ([place])    a. 02/2015 Echo: EF 50-55%, distal septal, apical, inferobasal HK, mild LVH, mild MR, mildly dil LA.   Cocaine use    Colon polyps    a. 09/2015 colonscopy: multiple sessile polyps, path negative for high grade dysplasia.   Depression    Emphysema (subcutaneous) (surgical) resulting from a procedure 10/2008.   Bleb resected at time of 10/2008 CABG. Marketed emphysema noted on surgical report.   Esophagitis    a. 2017 EGD: esophagitis, duodenitis.   ETOH abuse    GERD (gastroesophageal reflux disease)    Hemianopia, homonymous, right    "can't see out the right side of either eye since stroke in 2016/pt description 09/23/2016   Hepatic steatosis    Hiatal hernia    History of nuclear stress test 08/2017   Nuclear stress test 1/19: EF 35, inf-lat and ant-lat, apical sept/apical scar; no ischemia; Intermediate Risk   Hyperlipidemia    Hypertension    Iron deficiency anemia 2010   Morbid obesity ([place])    Myocardial infarction [facility]) "several"   PAD (peripheral artery disease) ([place])    Pancreatitis 06/2015   Sleep apnea    "don't have a mask" (09/23/2016)   Stroke ([place]) 02/2015   a. 02/2015 Carotid U/S: 1-39% bilat ICA stenosis; "left me blind" (09/23/2016)   Tobacco abuse    still smokes   Tubular adenoma of colon        Objective:    wts   10/12/2022       ***    03/10/21 217 lb 3.2 oz (98.5 kg)  01/28/21 218 lb (98.9 kg)  01/28/21 218 lb (98.9 kg)    Vital signs reviewed  10/12/2022  - Note at rest 02 sats  ***% on ***   General appearance:    ***             Assessment

## 2022-10-12 ENCOUNTER — Ambulatory Visit: Payer: Medicare HMO | Admitting: Internal Medicine

## 2022-10-13 ENCOUNTER — Encounter: Payer: Self-pay | Admitting: Pharmacist

## 2022-10-13 ENCOUNTER — Other Ambulatory Visit: Payer: Medicare HMO | Admitting: Pharmacist

## 2022-10-13 DIAGNOSIS — E785 Hyperlipidemia, unspecified: Secondary | ICD-10-CM

## 2022-10-13 DIAGNOSIS — I10 Essential (primary) hypertension: Secondary | ICD-10-CM

## 2022-10-13 DIAGNOSIS — I5032 Chronic diastolic (congestive) heart failure: Secondary | ICD-10-CM

## 2022-10-13 MED ORDER — DAPAGLIFLOZIN PROPANEDIOL 5 MG PO TABS: 5.0000 mg | ORAL_TABLET | Freq: Every day | ORAL | 3 refills | Status: DC

## 2022-10-13 NOTE — Progress Notes (Signed)
Pharmacy Note  10/13/2022 Name: [name]. MRN: [id number] DOB: 1960/10/16  Subjective: [name]. is a 62 y.o. year old male who is a primary care patient of Colon Branch, MD. Clinical Pharmacist Practitioner referral was placed to assist with medication and CHF and diabetes management.    Engaged with patient by telephone for follow up visit today.  CHF/ HTN:  Current therapy: Farxiga '5mg'$  daily, Spironolactone '25mg'$  0.5 tablet daily, valsartan '40mg'$  daily and furosemide '20mg'$  daily (patient is only taking furosemide as needed) Patient report he is weighing daily. States weight has been stable around 235 bls.  He is limiting intake of sodium.  Not checking blood pressure daily. Denies chest pain. Has occasional edema.   Type 2 DM:  Current therapy - taking Farxiga '5mg'$  daily but is more for CHF.  Not checking blood glucose at home. Does not have glucometer and does not really want to check blood glucose at home unless he has to.   Med Management:  Reviewed Rx and over-the-counter meds. Updated med list.  Patient reported that he was taking over-the-counter Mucinex and Pseudoephedrine as needed for cough and congestion. Not taking daily.   Hyperlipidemia: last LDL was at goal:  Patient is taking atorvastatin '80mg'$  daily, ezetimibe '10mg'$  daily and fenofibrate '160mg'$  daily.      SDOH (Social Determinants of Health) assessments and interventions performed:  SDOH Interventions    Flowsheet Row Chronic Care Management from 06/09/2022 in [facility] at [place] Management from 03/17/2022 in [facility] Primary Care at [place] Management from 02/04/2022 in [facility] at [place] Management from 01/15/2022 in [facility] Care at [facility] ED to Hosp-Admission (Discharged) from 12/23/2021 in [place] HF PCU Telephone  from 07/24/2021 in [facility] Primary Care at [place] Interventions        Food Insecurity Interventions Intervention Not Indicated -- Intervention Not Indicated -- Intervention Not Indicated Other (Comment)  [Mailed patient a list of local food pantries.]  Housing Interventions Intervention Not Indicated -- Intervention Not Indicated -- Intervention Not Indicated --  Transportation Interventions Intervention Not Indicated -- Intervention Not Indicated -- Intervention Not Indicated --  Utilities Interventions Intervention Not Indicated -- -- -- -- --  Financial Strain Interventions -- -- Intervention Not Indicated -- Intervention Not Indicated --  Physical Activity Interventions -- -- Patient Refused Patient Refused, Other (Comments)  [limited by leg pain /COPD] -- --  Stress Interventions -- -- Intervention Not Indicated -- -- --  Social Connections Interventions -- Intervention Not Indicated Intervention Not Indicated -- -- --        Objective: Review of patient status, including review of consultants reports, laboratory and other test data, was performed as part of comprehensive evaluation and provision of chronic care management services.   Lab Results  Component Value Date   CREATININE 1.10 04/29/2022   CREATININE 1.09 02/05/2022   CREATININE 1.39 (H) 12/26/2021    Lab Results  Component Value Date   HGBA1C 6.8 (H) 04/30/2022       Component Value Date/Time   CHOL 127 04/29/2022 1515   CHOL 171 11/14/2019 1441   TRIG 101.0 04/29/2022 1515   HDL 47.20 04/29/2022 1515   HDL 37 (L) 11/14/2019 1441   CHOLHDL 3 04/29/2022 1515   VLDL 20.2 04/29/2022 1515  LDLCALC 60 04/29/2022 1515   LDLCALC 116 (H) 11/14/2019 1441   LDLDIRECT 116.0 05/12/2013 1517     Clinical ASCVD: Yes  The ASCVD Risk score (Arnett DK, et al., 2019) failed to calculate for the following reasons:   The patient has a prior MI or stroke diagnosis    BP Readings from Last 3  Encounters:  08/28/22 108/76  04/29/22 136/74  04/07/22 108/60     Allergies  Allergen Reactions   Metformin And Related Diarrhea and Other (See Comments)    Severe diarrhea   Wellbutrin [Bupropion] Nausea Only    Medications Reviewed Today     Reviewed by Cherre Robins, RPH-CPP (Pharmacist) on 10/13/22 at 1554  Med List Status: <None>   Medication Order Taking? Sig Documenting Provider Last Dose Status Informant  aspirin EC 81 MG EC tablet [id number] Yes Take 1 tablet (81 mg total) by mouth daily with breakfast. Roxan Hockey, MD Taking Active Multiple Informants  atorvastatin (LIPITOR) 80 MG tablet [id number] Yes Take 1 tablet (80 mg total) by mouth daily at 6 PM. Burnell Blanks, MD Taking Active   budesonide-formoterol [facility]) 160-4.5 MCG/ACT inhaler [id number] Yes Inhale 2 puffs into the lungs 2 (two) times daily. Colon Branch, MD Taking Active Multiple Informants  Cholecalciferol (VITAMIN D-3) 125 MCG (5000 UT) TABS [id number] Yes Take 5,000 Units by mouth daily. [provider] Taking Active Multiple Informants  clopidogrel (PLAVIX) 75 MG tablet [id number] Yes TAKE 1 TABLET EVERY DAY Burnell Blanks, MD Taking Active Multiple Informants  Cyanocobalamin (VITAMIN B-12) 5000 MCG SUBL [id number] Yes Place 5,000 mcg under the tongue daily. [provider] Taking Active Multiple Informants  cyclobenzaprine (FLEXERIL) 10 MG tablet [id number]  Take 10 mg by mouth 3 (three) times daily as needed. [provider]  Active   dapagliflozin propanediol (FARXIGA) 5 MG TABS tablet [id number] Yes Take 1 tablet (5 mg total) by mouth daily before breakfast. Replaces Jardiance '10mg'$  Colon Branch, MD Taking Active            Med Note Barbaraann Boys May 11, 2022  1:08 PM) Getting from [facility] and Me PAP  diclofenac Sodium (VOLTAREN) 1 % GEL [id number]  Apply 1 application  topically daily as needed for pain.  Patient not taking: Reported on 08/28/2022    [provider]  Active Multiple Informants           Med Note Barbaraann Boys May 11, 2022  1:08 PM)    ezetimibe (ZETIA) 10 MG tablet [id number] Yes Take 1 tablet (10 mg total) by mouth daily. Burnell Blanks, MD Taking Active   fenofibrate (TRICOR) 145 MG tablet [id number] Yes Take 1 tablet (145 mg total) by mouth daily. Colon Branch, MD Taking Active   folic acid (FOLVITE) 1 MG tablet [id number] Yes Take 1 tablet (1 mg total) by mouth daily. Roxan Hockey, MD Taking Active Multiple Informants  furosemide (LASIX) 20 MG tablet [id number] No TAKE 1 TO 2 TABLETS EVERY DAY IN THE MORNING  Patient not taking: Reported on 10/13/2022   Kennyth Arnold, FNP Not Taking Active            Med Note Minerva Ends, [facility]   Tue Jun 09, 2022 11:59 AM) Reports taking 2 tablets per day  guaiFENesin (MUCINEX) 600 MG 12 hr tablet [id number] Yes Take 600 mg by mouth 2 (two) times daily as needed for cough. [provider] Taking Active  isosorbide mononitrate (IMDUR) 60 MG 24 hr tablet [id number] Yes TAKE 1 AND 1/2 TABLETS EVERY DAY McAlhany, Annita Brod, MD Taking Active Multiple Informants  nicotine (NICOTINE STEP 1) 21 mg/24hr patch [id number] No Place 1 patch (21 mg total) onto the skin daily.  Patient not taking: Reported on 08/28/2022   Colon Branch, MD Not Taking Active   oxyCODONE-acetaminophen (PERCOCET) 10-325 MG tablet [id number] Yes Take 1 tablet by mouth every 6 (six) hours as needed for pain. [provider] Taking Active   OXYGEN [id number] Yes Inhale 2-4 L/min into the lungs as needed (for shortness of breath). [provider] Taking Active Multiple Informants           Med Note (CANTER, Cheri Rous   Wed Apr 29, 2022  2:33 PM) PRN  pantoprazole (PROTONIX) 40 MG tablet [id number] Yes TAKE 1 TABLET TWICE DAILY Burnell Blanks, MD Taking Active   pregabalin (LYRICA) 75 MG capsule [id number] No Take 75 mg by mouth 3 (three) times daily as needed.  Patient  not taking: Reported on 10/13/2022   [provider] Not Taking Consider Medication Status and Discontinue   PRESCRIPTION MEDICATION [id number] No CPAP- At bedtime  Patient not taking: Reported on 10/13/2022   [provider] Not Taking Active Multiple Informants  spironolactone (ALDACTONE) 25 MG tablet [id number] Yes TAKE 1/2 TABLET EVERY DAY Burnell Blanks, MD Taking Active Multiple Informants  tamsulosin (FLOMAX) 0.4 MG CAPS capsule [id number] Yes TAKE 1 CAPSULE BY MOUTH IN THE MORNING AND AT BEDTIME. Dutch Quint B, FNP Taking Active   thiamine 100 MG tablet [id number] Yes Take 1 tablet (100 mg total) by mouth daily. Roxan Hockey, MD Taking Active Multiple Informants  traZODone (DESYREL) 100 MG tablet [id number] Yes Take 1 tablet (100 mg total) by mouth at bedtime. Colon Branch, MD Taking Active   valsartan (DIOVAN) 40 MG tablet [id number] Yes Take 1 tablet (40 mg total) by mouth daily. Colon Branch, MD Taking Active             Patient Active Problem List   Diagnosis Date Noted   Palliative care encounter 04/13/2022   COPD (chronic obstructive pulmonary disease) ([place]) 04/07/2022   Class 1 obesity 12/25/2021   AKI (acute kidney injury) ([place]) 12/25/2021   Acute on chronic diastolic heart failure ([place]) 12/23/2021   Chronic respiratory failure with hypoxia ([place]) 12/23/2021   COPD exacerbation ([place]) 12/23/2021   Alcohol use 12/23/2021   (HFpEF) heart failure with preserved ejection fraction ([place])    DOE (dyspnea on exertion) 11/07/2020   Type 2 diabetes mellitus with hyperlipidemia ([place]) 03/18/2020   Prolapsed lumbar disc 01/25/2020   Hiatal hernia    Hepatic steatosis    Hemianopia, homonymous, right    ETOH abuse    Esophagitis    Chronic diastolic CHF (congestive heart failure) (HCC)    Chronic bronchitis ([place])    CAD (coronary artery disease)    PAD (peripheral artery disease) ([place]) 09/23/2016   Morbid obesity ([place])    Chronic pain syndrome 10/23/2015    GERD (gastroesophageal reflux disease) 08/07/2015   PCP NOTES >>>>> 06/01/2015   Acute ischemic stroke ([place]) 02/20/2015   BPH (benign prostatic hyperplasia) 02/20/2015   OSA (obstructive sleep apnea) ?? 05/05/2013   Erectile dysfunction 03/17/2012   Emphysema (subcutaneous) (surgical) resulting from a procedure 10/08/2008   Iron deficiency anemia 08/10/2008   ABNORMALITY OF GAIT 08/06/2008   Cigarette smoker 07/27/2008   Essential hypertension 07/26/2008   ST elevation  myocardial infarction involving left circumflex coronary artery ([place]) 07/26/2008     Medication Assistance:  Wilder Glade obtained through [facility] and Me medication assistance program.  Sent in updated Rx for Farxiga to [facility]. Enrollment ends 08/10/2023 - pateint should received shipment about 1 week.    Assessment / Plan:  CHF - stable Discussed importance of taking furosemide as directed. OK to take a little later in day if he will be away from home Continue spironolactone, Farxiga and valsartan Reviewed signs and symptoms of CHF exacerbation - weight gain, SOB, abdominal fullness, swelling in legs or abdomen, Fatigue and weakness, changes in ability to perform usual activities, persistent cough or wheezing with white or pink blood-tinged mucus, nausea and lack of appetite Continue to weigh daily - report weight gain of more than 3 lbs in 24 hours or 5 lbs in 1 week. .  Type 2 DM - at goal, [place] mostly for CHF Continue Farxiga '5mg'$  daily Ok to not check blood glucose at home at this time. IF A1c increases to > 7% would consider glucometer.   Hyperlipidemia - at goal of LDL < 70. Continue ezetimibe, atorvastatin '80mg'$  daily and fenofibrate.    Med Management:  Reviewed Rx and over-the-counter meds. Updated med list.  Reviewed refill history and adherence.  Recommended patient stop over-the-counter pseudoephedrine as this can increase HR and blood pressure.  Ok to continue over-the-counter Mucinex as needed for cough  and congestion.  Follow Up:  Telephone follow up appointment with care management team member scheduled for:  3 months Next PCP appointment scheduled for: 10/26/2022   Cherre Robins, PharmD Clinical Pharmacist [place] [phone]

## 2022-10-19 ENCOUNTER — Telehealth: Payer: Self-pay | Admitting: Cardiovascular Disease

## 2022-10-19 NOTE — Telephone Encounter (Signed)
Sister states patient wants to cancel LE Arterial test as patient's doctor has determined the cause of the issue is a herniated disc in patient's back.

## 2022-10-19 NOTE — Telephone Encounter (Signed)
This is a patient of Dr. Fletcher Anon and this testing is ordered by him yearly.

## 2022-10-20 ENCOUNTER — Ambulatory Visit (HOSPITAL_COMMUNITY): Admission: RE | Admit: 2022-10-20 | Payer: Medicare HMO | Source: Ambulatory Visit

## 2022-10-20 NOTE — Telephone Encounter (Signed)
Called and spoke with the sister, per the dpr. She stated that the patient was sick and unable to come to the [place] today. She has been advised that the patient does have PAD and would need annual testing for now on his lower extremities especially since his intervention in July of 2023. He has a follow up in April with Dr. Fletcher Anon. She will advise the patient to keep that appointment.

## 2022-10-23 ENCOUNTER — Encounter (HOSPITAL_COMMUNITY): Payer: Self-pay | Admitting: Nurse Practitioner

## 2022-10-26 ENCOUNTER — Ambulatory Visit: Payer: Medicare HMO | Admitting: Internal Medicine

## 2022-10-26 ENCOUNTER — Encounter: Payer: Self-pay | Admitting: Internal Medicine

## 2022-10-26 DIAGNOSIS — I739 Peripheral vascular disease, unspecified: Secondary | ICD-10-CM | POA: Diagnosis not present

## 2022-10-26 DIAGNOSIS — M5126 Other intervertebral disc displacement, lumbar region: Secondary | ICD-10-CM | POA: Diagnosis not present

## 2022-10-26 DIAGNOSIS — Z5181 Encounter for therapeutic drug level monitoring: Secondary | ICD-10-CM | POA: Diagnosis not present

## 2022-10-26 DIAGNOSIS — M5416 Radiculopathy, lumbar region: Secondary | ICD-10-CM | POA: Diagnosis not present

## 2022-10-26 DIAGNOSIS — Z79899 Other long term (current) drug therapy: Secondary | ICD-10-CM | POA: Diagnosis not present

## 2022-10-27 ENCOUNTER — Telehealth: Payer: Self-pay | Admitting: Licensed Clinical Social Worker

## 2022-10-27 NOTE — Patient Outreach (Signed)
  Care Coordination   10/27/2022 Name: [name]. MRN: [id number] DOB: 1960/11/19   Care Coordination Outreach Attempts:  An unsuccessful telephone outreach was attempted today to offer the patient information about available care coordination services as a benefit of their health plan.   Follow Up Plan:  Additional outreach attempts will be made to offer the patient care coordination information and services.   Encounter Outcome:  No Answer   Care Coordination Interventions:  No, not indicated    Casimer Lanius, [place] [phone]

## 2022-10-30 ENCOUNTER — Encounter: Payer: Self-pay | Admitting: Internal Medicine

## 2022-10-30 ENCOUNTER — Ambulatory Visit: Payer: Medicare HMO | Admitting: Internal Medicine

## 2022-10-30 ENCOUNTER — Telehealth: Payer: Self-pay

## 2022-10-30 NOTE — Telephone Encounter (Signed)
Pt called to cancel his appt 10/30/2022. Pt states he will call back to reschedule.

## 2022-10-31 ENCOUNTER — Other Ambulatory Visit: Payer: Self-pay | Admitting: Internal Medicine

## 2022-11-02 ENCOUNTER — Ambulatory Visit: Payer: Self-pay | Admitting: Licensed Clinical Social Worker

## 2022-11-02 NOTE — Patient Outreach (Signed)
  Care Coordination   Initial Visit Note   11/02/2022 Name: [name]. MRN: [id number] DOB: Sep 15, 1960  [name]. is a 62 y.o. year old male who sees [name], [name], MD for primary care. I spoke with  [name]. by phone today.  What matters to the patients health and wellness today?    Patient scheduled phone appointment to obtain additional information about the Care Coordination Program..   SDOH assessments and interventions completed:  No  Care Coordination Interventions:  No, not indicated   Follow up plan: Follow up call scheduled for 11/10/22    Encounter Outcome:  Pt. Visit Completed  Casimer Lanius, [place] [phone]

## 2022-11-02 NOTE — Patient Instructions (Signed)
  It was a pleasure speaking with you today. Per your request a Care Coordination phone appointment is scheduled 11/10/22  [name], [place] [facility] Care Coordination  [phone]

## 2022-11-05 ENCOUNTER — Other Ambulatory Visit: Payer: Self-pay | Admitting: Internal Medicine

## 2022-11-09 ENCOUNTER — Telehealth (HOSPITAL_COMMUNITY): Payer: Self-pay | Admitting: Nurse Practitioner

## 2022-11-09 NOTE — Telephone Encounter (Signed)
Just an FYI. We have made several attempts to contact this patient including sending a letter to schedule or reschedule their echocardiogram. We will be removing the patient from the echo [place].    10/23/22 MAILED LETTER [facility] 10/16/22@1159  LMOM to schedule evd 10/13/22 [facility] to schedule x 2 @ 9:31/LBW 10/08/22 [facility] to schedule @ 11:27/LBW       Thank you

## 2022-11-10 ENCOUNTER — Telehealth: Payer: Self-pay | Admitting: Pharmacist

## 2022-11-10 ENCOUNTER — Ambulatory Visit: Payer: Self-pay | Admitting: Licensed Clinical Social Worker

## 2022-11-10 NOTE — Patient Outreach (Signed)
  Care Coordination  Initial Visit Note   11/10/2022 Name: [name]. MRN: [id number] DOB: 05/30/1961  [name]. is a 62 y.o. year old male who sees [name], [name], MD for primary care. I spoke with  [name]. by phone today.  What matters to the patients health and wellness today?  Managing his health and reducing stress    Goals Addressed             This Visit's Progress    Manage and reduce stress       Activities and task to complete in order to accomplish goals.   Keep all upcoming appointment discussed today I have scheduled a phone appointment with the RN Care Manager she will provide educational informational and assist you with managing your health needs I have also sent a message to [place] about your concerns of not receiving your Wilder Glade   We will spend more time on the 22nd discussing ways to help with reducing your stress       SDOH assessments and interventions completed:  Yes  SDOH Interventions Today    Flowsheet Row Most Recent Value  SDOH Interventions   Food Insecurity Interventions Intervention Not Indicated  Housing Interventions Intervention Not Indicated  Transportation Interventions Intervention Not Indicated  Utilities Interventions Intervention Not Indicated  Stress Interventions Provide Counseling       Care Coordination Interventions:  Yes, provided  Interventions Today    Flowsheet Row Most Recent Value  Chronic Disease   Chronic disease during today's visit Diabetes, Hypertension (HTN), Chronic Obstructive Pulmonary Disease (COPD), Congestive Heart Failure (CHF)  General Interventions   General Interventions Discussed/Reviewed General Interventions Reviewed, Referral to Nurse, Communication with  [reviewed care coordination program]  Communication with RN, Pharmacists  [place] Discussed  [will spend more on stressors at next encounter]   Pharmacy Interventions   Pharmacy Dicussed/Reviewed Medication Adherence  Medication Adherence Not taking medication  [has not recieved Wilder Glade ,    information shared with pharmacist via in-basket message]       Follow up plan: Follow up call scheduled for 11/30/22    Encounter Outcome:  Pt. Visit Completed   Casimer Lanius, [place] [phone]

## 2022-11-10 NOTE — Telephone Encounter (Signed)
Called [facility] - they did receive Rx that was e-prescribed 10/13/2022 but they needed call to them to confirm shipping address.  Confirmed today. [facility] will send Wilder Glade to patient thru expedited shipping - could be 5 to 7 business days.  Called patient to see if he was out of [place] - unable to reach patient. LM on VM to call if he is out of [place]. I can send 1 or 2 week supply to his pharmacy.

## 2022-11-10 NOTE — Patient Instructions (Signed)
Visit Information  Thank you for taking time to visit with me today. Please don't hesitate to contact me if I can be of assistance to you.   Following are the goals we discussed today:   Goals Addressed             This Visit's Progress    Manage and reduce stress       Activities and task to complete in order to accomplish goals.   Keep all upcoming appointment discussed today I have scheduled a phone appointment with the Garey she will provide educational informational and assist you with managing your health needs I have also sent a message to Tammy about your concerns of not receiving your Wilder Glade   We will spend more time on the 22nd discussing ways to help with reducing your stress        Our next appointment is by telephone on 11/30/22 at 3:00  Please call the care guide team at [phone] if you need to cancel or reschedule your appointment.   If you are experiencing a Mental Health or [place] or need someone to talk to, please call the Suicide and Crisis Lifeline: 988 call the [place] National Suicide Prevention Lifeline: 831-208-4439 or TTY: 336 242 3082 TTY 716-541-2231) to talk to a trained counselor call 1-800-273-TALK (toll free, 24 hour hotline) go to [facility] [address] [place] [phone])   Patient verbalizes understanding of instructions and care plan provided today and agrees to view in [place]. Active MyChart status and patient understanding of how to access instructions and care plan via MyChart confirmed with patient.     Casimer Lanius, [place] [phone]

## 2022-11-10 NOTE — Telephone Encounter (Signed)
-----   Message from Maurine Cane, LCSW sent at 11/10/2022  3:08 PM EDT ----- Regarding: update on pt's medication Hi Orby Tangen, I spoke to Mr Stonebreaker today. He wanted me to inform you that he has not received his [place].  Michela Pitcher it has been about two or three weeks.  Not sure if there is anything you can do to assist him.   Casimer Lanius, [place] [phone]

## 2022-11-11 ENCOUNTER — Other Ambulatory Visit: Payer: Self-pay | Admitting: Cardiovascular Disease

## 2022-11-11 ENCOUNTER — Other Ambulatory Visit: Payer: Self-pay | Admitting: Internal Medicine

## 2022-11-12 ENCOUNTER — Ambulatory Visit (HOSPITAL_COMMUNITY): Admission: RE | Admit: 2022-11-12 | Payer: Medicare HMO | Source: Ambulatory Visit

## 2022-11-18 ENCOUNTER — Ambulatory Visit: Payer: Medicare HMO

## 2022-11-18 ENCOUNTER — Telehealth: Payer: Self-pay | Admitting: *Deleted

## 2022-11-18 NOTE — Chronic Care Management (AMB) (Signed)
  Chronic Care Management   CCM RN Visit Note  11/18/2022 Name: [name]. MRN: [id number] DOB: 06-18-1961  Subjective: [name]. is a 62 y.o. year old male who is a primary care patient of Wanda Plump, MD. The patient was referred to the Chronic Care Management team for assistance with care management needs subsequent to provider initiation of CCM services and plan of care.    Today's Visit:  Engaged with patient by telephone for follow up visit.             PLAN:

## 2022-11-18 NOTE — Telephone Encounter (Signed)
Preop Team,  It appears an echo was order for patient to evaluate heart function secondary to results of stress test. This has not been scheduled. Preoperative cardiac risk assessment cannot be performed until patient undergoes echo.   Please contact patient to see if this can be scheduled and notify requesting office.   Thank you!  DW

## 2022-11-18 NOTE — Telephone Encounter (Signed)
Pt also has appt with Robin Searing, NP 11/23/22, who ordered the echo. Procedure may need to be post poned until the pt has been seen NP and echo has been completed, I will update all parties involved.

## 2022-11-18 NOTE — Telephone Encounter (Signed)
   Pre-operative Risk Assessment    Patient Name: [name].  DOB: 1961/06/29 MRN: [id number]      Request for Surgical Clearance    Procedure:   LUMBAR EPIDURAL STEROID INJECTION  Date of Surgery:  Clearance 11/26/22                                 Surgeon:  NOT LISTED Surgeon's Group or Practice Name:  [facility] Phone number:  [phone] Fax number:  [phone] ATTN: Karleen Dolphin   Type of Clearance Requested:   - Medical  - Pharmacy:  Hold Clopidogrel (Plavix) x 7 DAYS   Type of Anesthesia:   NOT LISTED   Additional requests/questions:    Elpidio Anis   11/18/2022, 4:32 PM

## 2022-11-19 ENCOUNTER — Encounter: Payer: Self-pay | Admitting: Internal Medicine

## 2022-11-20 ENCOUNTER — Telehealth (INDEPENDENT_AMBULATORY_CARE_PROVIDER_SITE_OTHER): Payer: Medicare HMO | Admitting: Family Medicine

## 2022-11-20 ENCOUNTER — Encounter: Payer: Self-pay | Admitting: Family Medicine

## 2022-11-20 VITALS — Ht 71.0 in | Wt 223.0 lb

## 2022-11-20 DIAGNOSIS — R197 Diarrhea, unspecified: Secondary | ICD-10-CM | POA: Diagnosis not present

## 2022-11-20 NOTE — Progress Notes (Signed)
Virtual Visit via Telephone Note  I connected with  [name]. on 11/20/22 at 12:20 PM EDT by telephone and verified that I am speaking with the correct person using two identifiers.   I discussed the limitations, risks, security and privacy concerns of performing an evaluation and management service by telephone and the availability of in person appointments. I also discussed with the patient that there may be a patient responsible charge related to this service. The patient expressed understanding and agreed to proceed.  Participating parties included in this telephone visit include: The patient and the nurse practitioner listed.  The patient is: At home I am: at [facility] at [facility]   Subjective:    CC: diarrhea  HPI: [name]. is a 62 y.o. year old male presenting today via telephone visit to discuss diarrhea.   Diarrhea: Patient complains of diarrhea. Onset of diarrhea was  4 weeks ago . Diarrhea is occurring approximately 4 times per day (was occurring a little more frequently prior to this week). Patient describes diarrhea as watery, black/brown. Diarrhea has been associated with  occasional nausea .  Patient denies abdominal pain, fevers, chills, fatigue, vomiting, bleeding.  Previous visits for diarrhea: none. Evaluation to date: none. Treatment to date: he has tried Pepto bismol (daily for 4 weeks - black stool). No new medications or diet changes. No recent sick contacts. No recent antibiotics.  Eats breakfast and dinner daily (english muffin, meat, vegetable, etc). Stays well hydrated.        Past medical history, Surgical history, Family history not pertinant except as noted below, Social history, Allergies, and medications have been entered into the medical record, reviewed, and corrections made.   Review of Systems:  All review of systems negative except what is listed in the HPI  Objective:    General:  Patient speaking  clearly in complete sentences. No shortness of breath noted.   Alert and oriented x3.   Normal judgment.  No apparent acute distress.  Impression and Recommendations:    1. Diarrhea, unspecified type Ordering blood work and stool testing. Discussed gentle/bland diet, adequate hydration, and symptoms to monitor for.  Patient aware of signs/symptoms requiring further/urgent evaluation.    - CBC with Differential/Platelet; Future - Comprehensive metabolic panel; Future - Fecal occult blood, imunochemical; Future - Stool culture; Future - Clostridium Difficile by PCR; Future       I discussed the assessment and treatment plan with the patient. The patient was provided an opportunity to ask questions and all were answered. The patient agreed with the plan and demonstrated an understanding of the instructions.   The patient was advised to call back or seek an in-person evaluation if the symptoms worsen or if the condition fails to improve as anticipated.  I provided 25 minutes of non-face-to-face time during this TELEPHONE encounter.    Clayborne Dana, NP .

## 2022-11-20 NOTE — Progress Notes (Signed)
Diarrhea - started 4 weeks ago  Approx 4 x day  No abdominal pain No other symptoms Taking Pepto bismol - no other medications  No recent medication changes

## 2022-11-22 NOTE — Progress Notes (Unsigned)
Office Visit    Patient Name: [name]. Date of Encounter: 11/22/2022  Primary Care Provider:  Wanda Plump, MD Primary Cardiologist:  Verne Carrow, MD Primary Electrophysiologist: None  Chief Complaint    [name] is a 62 y.o. male with PMH of CAD s/p CABG (LIMA to LAD, SVG to Circumflex, SVG to PDA) 2010, lateral STEMI 2017 with BMS to SVG to OM x 1, HLD, HTN, HFpEF, DM type II, OSA (on CPAP), IDA, CVA 2016, polysubstance (cocaine/tobacco/EtOH), PDA Right SFA s/p arthrectomy and drug-coated balloon angioplasty with stenting who presents today for follow-up of coronary artery disease.   Past Medical History    Past Medical History:  Diagnosis Date   (HFpEF) heart failure with preserved ejection fraction    Echo 6/22: EF 55-60, no RWMA, GR 3 DD, elevated LVEDP, normal RVSF, RVSP 53.2, mild to moderate AV sclerosis without stenosis, dilated aortic root (41 mm), ascending aorta 37 mm   Abnormality of gait    Anxiety    CAD (coronary artery disease)    a. 10/2008 Inferolateral STEMI: 3VD w/ occluded LCX (PTCA)-->CABG x 3 (LIMA->LAD, VG->OM, VG->PDA);  b. 06/2009 NSTEMI/PCI: VG->OM 100 (BMS);  c. 05/2013 native 3VD, 3/3 patent grafts. d. 06/2016: Inferior STEMI w/ occluded SVG-OM (DES placed). LIMA-LAD and SVG-PDA patent.   Chronic bronchitis    Chronic diastolic CHF (congestive heart failure)    a. 02/2015 Echo: EF 50-55%, distal septal, apical, inferobasal HK, mild LVH, mild MR, mildly dil LA.   Cocaine use    Colon polyps    a. 09/2015 colonscopy: multiple sessile polyps, path negative for high grade dysplasia.   Depression    Emphysema (subcutaneous) (surgical) resulting from a procedure 10/2008.   Bleb resected at time of 10/2008 CABG. Marketed emphysema noted on surgical report.   Esophagitis    a. 2017 EGD: esophagitis, duodenitis.   ETOH abuse    GERD (gastroesophageal reflux disease)    Hemianopia, homonymous, right    "can't see out the right  side of either eye since stroke in 2016/pt description 09/23/2016   Hepatic steatosis    Hiatal hernia    History of nuclear stress test 08/2017   Nuclear stress test 1/19: EF 35, inf-lat and ant-lat, apical sept/apical scar; no ischemia; Intermediate Risk   Hyperlipidemia    Hypertension    Iron deficiency anemia 2010   Morbid obesity    Myocardial infarction "several"   PAD (peripheral artery disease)    Pancreatitis 06/2015   Sleep apnea    "don't have a mask" (09/23/2016)   Stroke 02/2015   a. 02/2015 Carotid U/S: 1-39% bilat ICA stenosis; "left me blind" (09/23/2016)   Tobacco abuse    still smokes   Tubular adenoma of colon    Past Surgical History:  Procedure Laterality Date   ABDOMINAL AORTOGRAM W/LOWER EXTREMITY N/A 09/23/2016   Procedure: Abdominal Aortogram w/Lower Extremity;  Surgeon: Iran Ouch, MD;  Location: [facility] INVASIVE CV LAB;  Service: Cardiovascular;  Laterality: N/A;   ABDOMINAL AORTOGRAM W/LOWER EXTREMITY N/A 02/18/2022   Procedure: ABDOMINAL AORTOGRAM W/LOWER EXTREMITY;  Surgeon: Iran Ouch, MD;  Location: [facility] CV [facility];  Service: Cardiovascular;  Laterality: N/A;   CARDIAC CATHETERIZATION  11/07/2008   EF of 50-55% -- normal LV systolic function, acute inferolateral ST elevation MI,  PTCA of the circumflex artery -- Colleen Can. Deborah Chalk, [name]   CARDIAC CATHETERIZATION N/A 07/07/2016   Procedure: Left Heart Cath and Cors/Grafts  Angiography;  Surgeon: Peter M Swaziland, MD;  Location: [facility] INVASIVE CV LAB;  Service: Cardiovascular;  Laterality: N/A;   CARDIAC CATHETERIZATION N/A 07/07/2016   Procedure: Coronary Stent Intervention;  Surgeon: Peter M Swaziland, MD;  Location: [facility] INVASIVE CV LAB;  Service: Cardiovascular;  Laterality: N/A;   CORONARY ANGIOPLASTY     CORONARY ARTERY BYPASS GRAFT     "CABG X4"   LEFT HEART CATH AND CORS/GRAFTS ANGIOGRAPHY N/A 10/01/2017   Procedure: LEFT HEART CATH AND CORS/GRAFTS ANGIOGRAPHY;  Surgeon: Kathleene Hazel, MD;   Location: [facility] CV [facility];  Service: Cardiovascular;  Laterality: N/A;   LEFT HEART CATHETERIZATION WITH CORONARY/GRAFT ANGIOGRAM N/A 05/15/2013   Procedure: LEFT HEART CATHETERIZATION WITH Isabel Caprice;  Surgeon: Peter M Swaziland, MD;  Location[facility] CATH LAB;  Service: Cardiovascular;  Laterality: N/A;   PERIPHERAL VASCULAR ATHERECTOMY  02/18/2022   Procedure: PERIPHERAL VASCULAR ATHERECTOMY;  Surgeon: Iran Ouch, MD;  Location: [facility] CV [facility];  Service: Cardiovascular;;   PERIPHERAL VASCULAR INTERVENTION  09/23/2016   Procedure: Peripheral Vascular Intervention;  Surgeon: Iran Ouch, MD;  Location: [facility] CV [facility];  Service: Cardiovascular;;  Lt. SFA    Allergies  Allergies  Allergen Reactions   Metformin And Related Diarrhea and Other (See Comments)    Severe diarrhea   Wellbutrin [Bupropion] Nausea Only    History of Present Illness    [name].  is a 62 year old male with the above mention past medical history who presents today for follow-up of coronary artery disease.  [name] presented in 2010 with inferior lateral STEMI and found to have three-vessel disease and underwent emergent CABG (LIMA to LAD, SVG to Circumflex, SVG to PDA).  He presented 06/2009 with NSTEMI and had BMS placed to totally occluded saphenous vein graft to obtuse marginal artery.  He suffered an acute ischemic CVA on 02/2015 and carotid Dopplers completed with mild bilateral carotid disease noted.  He was admitted 07/07/2016 with lateral STEMI secondary to acute occlusion of the SVG to the OM that was treated with DES x 1.He had an acute ischemic CVA in April 2020. Echo April 2020 with LVEF=60-65%. He refused rehab and signed out AMA. He had volume overload in May 2022 and was started on Lasix. Echo June 2022 with LVEF=55-60% with grade 3 diastolic dysfunction. RVSP estimated at 53 mmHg. No valve disease.  He was last seen by Dr. Clifton James 09/15/2021 and was overall doing well  but continues to smoke.  He did report bradycardia and dizziness and Lopressor was discontinued.  He was euvolemic on exam with stable weights and blood pressure was controlled.  He was admitted to the ED on 12/23/2021 with complaint of dyspnea and lower extremity edema.  He was diuresed with Lasix and developed acute renal failure during admission with resolution prior to discharge.  2D echo was completed showing EF of 55-60% with mild LVH and RV systolic function preserved.  He was seen by Dr. Kirke Corin on 01/13/2022 for follow-up.  He reported severe leg claudication and had no palpable pulse distally.  ABI and duplex were arranged for further evaluation and patient had severe stenosis in the right mid SFA.  He underwent abdominal aortogram with directional arthrectomy and drug-coated balloon angioplasty to the right SFA.  He was seen in follow-up by Dr. Kirke Corin with continued right leg claudication.  He was seen in follow-up 08/28/2022 for preop clearance visit.  During visit patient endorsed episodes of dizziness that improved with  discontinuing gabapentin.  He was seeking clearance for upcoming back surgery.  He was sent for [facility] that showed areas of ischemia and was deemed high risk.  I consulted his primary cardiologist Dr. Clifton James who suggested he complete a 2D echo for further restratification.   Since last being seen in the office patient reports***.  Patient denies chest pain, palpitations, dyspnea, PND, orthopnea, nausea, vomiting, dizziness, syncope, edema, weight gain, or early satiety.     ***Notes: -Patient needs 2D echo before clearance will be granted. Home Medications    Current Outpatient Medications  Medication Sig Dispense Refill   aspirin EC 81 MG EC tablet Take 1 tablet (81 mg total) by mouth daily with breakfast. 30 tablet 2   atorvastatin (LIPITOR) 80 MG tablet Take 1 tablet (80 mg total) by mouth daily at 6 PM. 90 tablet 2   budesonide-formoterol (SYMBICORT) 160-4.5  MCG/ACT inhaler Inhale 2 puffs into the lungs 2 (two) times daily. 30 g 1   Cholecalciferol (VITAMIN D-3) 125 MCG (5000 UT) TABS Take 5,000 Units by mouth daily.     clopidogrel (PLAVIX) 75 MG tablet TAKE 1 TABLET EVERY DAY 90 tablet 3   Cyanocobalamin (VITAMIN B-12) 5000 MCG SUBL Place 5,000 mcg under the tongue daily.     cyclobenzaprine (FLEXERIL) 10 MG tablet Take 10 mg by mouth 3 (three) times daily as needed.     dapagliflozin propanediol (FARXIGA) 5 MG TABS tablet Take 1 tablet (5 mg total) by mouth daily before breakfast. 90 tablet 3   ezetimibe (ZETIA) 10 MG tablet Take 1 tablet (10 mg total) by mouth daily. 90 tablet 3   fenofibrate (TRICOR) 145 MG tablet Take 1 tablet (145 mg total) by mouth daily. 30 tablet 0   folic acid (FOLVITE) 1 MG tablet Take 1 tablet (1 mg total) by mouth daily. 90 tablet 3   guaiFENesin (MUCINEX) 600 MG 12 hr tablet Take 600 mg by mouth 2 (two) times daily as needed for cough.     isosorbide mononitrate (IMDUR) 60 MG 24 hr tablet TAKE 1 AND 1/2 TABLETS EVERY DAY 135 tablet 3   oxyCODONE-acetaminophen (PERCOCET) 10-325 MG tablet Take 1 tablet by mouth every 6 (six) hours as needed for pain.     OXYGEN Inhale 2-4 L/min into the lungs as needed (for shortness of breath).     pantoprazole (PROTONIX) 40 MG tablet TAKE 1 TABLET TWICE DAILY 180 tablet 3   PRESCRIPTION MEDICATION CPAP- At bedtime     spironolactone (ALDACTONE) 25 MG tablet TAKE 1/2 TABLET EVERY DAY 45 tablet 3   tamsulosin (FLOMAX) 0.4 MG CAPS capsule Take 1 capsule (0.4 mg total) by mouth in the morning and at bedtime. 180 capsule 0   thiamine 100 MG tablet Take 1 tablet (100 mg total) by mouth daily. 90 tablet 3   traZODone (DESYREL) 100 MG tablet Take 1 tablet (100 mg total) by mouth at bedtime. 90 tablet 1   valsartan (DIOVAN) 40 MG tablet Take 1 tablet (40 mg total) by mouth daily. 90 tablet 0   No current facility-administered medications for this visit.     Review of Systems  Please see  the history of present illness.    (+)*** (+)***  All other systems reviewed and are otherwise negative except as noted above.  Physical Exam    Wt Readings from Last 3 Encounters:  11/20/22 223 lb (101.2 kg)  08/28/22 223 lb 12.8 oz (101.5 kg)  04/29/22 232 lb 8 oz (105.5 kg)  ZO:XWRUE were no vitals filed for this visit.,There is no height or weight on file to calculate BMI.  Constitutional:      Appearance: Healthy appearance. Not in distress.  Neck:     Vascular: JVD normal.  Pulmonary:     Effort: Pulmonary effort is normal.     Breath sounds: No wheezing. No rales. Diminished in the bases Cardiovascular:     Normal rate. Regular rhythm. Normal S1. Normal S2.      Murmurs: There is no murmur.  Edema:    Peripheral edema absent.  Abdominal:     Palpations: Abdomen is soft non tender. There is no hepatomegaly.  Skin:    General: Skin is warm and dry.  Neurological:     General: No focal deficit present.     Mental Status: Alert and oriented to person, place and time.     Cranial Nerves: Cranial nerves are intact.  EKG/LABS/ Recent Cardiac Studies    ECG personally reviewed by me today - ***  Risk Assessment/Calculations:   {Does this patient have ATRIAL FIBRILLATION?:[id number]}        Lab Results  Component Value Date   WBC 7.8 02/05/2022   HGB 15.4 02/05/2022   HCT 45.2 02/05/2022   MCV 92 02/05/2022   PLT 206 02/05/2022   Lab Results  Component Value Date   CREATININE 1.10 04/29/2022   BUN 18 04/29/2022   NA 137 04/29/2022   K 4.2 04/29/2022   CL 99 04/29/2022   CO2 29 04/29/2022   Lab Results  Component Value Date   ALT 10 04/29/2022   AST 12 04/29/2022   ALKPHOS 66 04/11/2021   BILITOT 1.5 (H) 04/11/2021   Lab Results  Component Value Date   CHOL 127 04/29/2022   HDL 47.20 04/29/2022   LDLCALC 60 04/29/2022   LDLDIRECT 116.0 05/12/2013   TRIG 101.0 04/29/2022   CHOLHDL 3 04/29/2022    Lab Results  Component Value Date   HGBA1C  6.8 (H) 04/30/2022    Cardiac Studies & Procedures   CARDIAC CATHETERIZATION  CARDIAC CATHETERIZATION 10/01/2017  Narrative  Ost LM to Mid LM lesion is 20% stenosed.  Prox LAD lesion is 100% stenosed.  LIMA graft was visualized by angiography and is normal in caliber.  Prox Cx lesion is 100% stenosed.  SVG graft was visualized by angiography and is normal in caliber.  Previously placed Origin to Prox Graft stent (unknown type) is widely patent.  Prox RCA lesion is 100% stenosed.  SVG graft was visualized by angiography and is normal in caliber.  1. Severe triple vessel CAD with occlusion of all three native vessels. He is s/p 3v CABG and all 3 vein grafts are patent. 2. Chronic mid LAD occlusion. LIMA to LAD is patent 3. Chronic mid Circumflex occlusion. SVG to OM is patent 4. Chronic proximal RCA occlusion. SVG to PDA is patent.  Recommendations: Continue medical management of CAD. Smoking cessation.  Findings Coronary Findings Diagnostic  Dominance: Right  Left Main Ost LM to Mid LM lesion is 20% stenosed.  Left Anterior Descending Vessel is large. Prox LAD lesion is 100% stenosed. The lesion is chronically occluded.  Left Circumflex Prox Cx lesion is 100% stenosed. The lesion is chronically occluded.  Right Coronary Artery Vessel is large. Prox RCA lesion is 100% stenosed. The lesion is chronically occluded.  LIMA LIMA Graft To Mid LAD LIMA graft was visualized by angiography and is normal in caliber.  Saphenous Graft To 2nd Mrg SVG  graft was visualized by angiography and is normal in caliber. Previously placed Origin to Prox Graft stent (unknown type) is widely patent.  Saphenous Graft To Dist RCA SVG graft was visualized by angiography and is normal in caliber.  Intervention  No interventions have been documented.   CARDIAC CATHETERIZATION  CARDIAC CATHETERIZATION 07/07/2016  Narrative  The left ventricular systolic function is normal.  LV  end diastolic pressure is moderately elevated.  The left ventricular ejection fraction is 55-65% by visual estimate.  Ost LAD to Prox LAD lesion, 95 %stenosed.  Prox LAD to Mid LAD lesion, 100 %stenosed.  Ost Cx to Mid Cx lesion, 100 %stenosed.  Prox RCA to Dist RCA lesion, 100 %stenosed.  SVG graft was visualized by angiography and is normal in caliber and anatomically normal.  LIMA graft was visualized by angiography and is normal in caliber and anatomically normal.  SVG graft was visualized by angiography and is large.  Origin to Prox Graft lesion, 100 %stenosed.  A STENT SYNERGY DES 4X20 drug eluting stent was successfully placed.  Post intervention, there is a 0% residual stenosis.  1. Severe 3 vessel obstructive CAD 2. Patent LIMA to the LAD 3. Occluded SVG to the OM. This is the culprit 4. Patent SVG to PDA 5. Good LV function 6. Elevated LVEDP 7. Successful PCI of the SVG to OM with DES  Plan: DAPT for one year. Risk factor modification.  Findings Coronary Findings Diagnostic  Dominance: Right  Left Anterior Descending The lesion is severely calcified.  Left Circumflex  Right Coronary Artery  Saphenous Graft To RPDA SVG graft was visualized by angiography and is normal in caliber and anatomically normal.  Saphenous Graft To 2nd Mrg SVG graft was visualized by angiography and is large. The lesion is thrombotic. The lesion was previously treated using a bare metal stent over 2 years ago.  LIMA LIMA Graft To Dist LAD LIMA graft was visualized by angiography and is normal in caliber and anatomically normal.  Intervention  Origin to Prox Graft lesion (Saphenous Graft To 2nd Mrg) Angioplasty Lesion crossed with guidewire using a WIRE ASAHI PROWATER 180CM. Pre-stent angioplasty was performed using a BALLOON EUPHORA [id number]. Maximum pressure: 8 atm. A STENT SYNERGY DES 4X20 drug eluting stent was successfully placed. Stent strut is well apposed. Post-stent  angioplasty was not performed. There is no pre-interventional antegrade distal flow (TIMI 0).  The post-interventional distal flow is normal (TIMI 3). The intervention was successful . No complications occurred at this lesion. There is a 0% residual stenosis post intervention.   STRESS TESTS  MYOCARDIAL PERFUSION IMAGING 10/06/2022  Narrative   Findings are consistent with infarction. The study is high risk.   No ST deviation was noted.   LV perfusion is abnormal. Defect 1: There is a medium defect with mild reduction in uptake present in the apical to basal anteroseptal and apex location(s) that is fixed. There is abnormal wall motion in the defect area. Consistent with infarction. Defect 2: There is a small defect with moderate reduction in uptake present in the mid to basal inferolateral location(s) that is fixed. There is abnormal wall motion in the defect area. Consistent with infarction.   Left ventricular function is abnormal. Nuclear stress EF: 28 %. The left ventricular ejection fraction is severely decreased (<30%). End diastolic cavity size is severely enlarged. End systolic cavity size is severely enlarged.   Prior study available for comparison from 08/27/2017.  Fixed anteroseptal, apical anterior, and apex perfusion defect with abnormal  wall motion consistent with infarct Fixed inferolateral perfusion defect with abnormal wall motion consistent with infarct Severe LV systolic dysfunction (EF 28%) High risk study due to severe LV systolic dysfunction.   ECHOCARDIOGRAM  ECHOCARDIOGRAM COMPLETE 12/24/2021  Narrative ECHOCARDIOGRAM REPORT    Patient Name:   Terrance Usery. Date of Exam: 12/24/2021 Medical Rec #:  [id number]            Height:       71.0 in Accession #:    [id number]           Weight:       228.6 lb Date of Birth:  12-14-1960            BSA:          2.232 m Patient Age:    61 years             BP:           120/74 mmHg Patient Gender: M                     HR:           78 bpm. Exam Location:  Inpatient  Procedure: 2D Echo, Cardiac Doppler, Color Doppler and Intracardiac Opacification Agent  Indications:    CHF  History:        Patient has prior history of Echocardiogram examinations, most recent 01/10/2021. CHF, CAD, COPD; Risk Factors:Hypertension, Sleep Apnea and Diabetes.  Sonographer:    Eduard Roux Referring Phys: [id number] ALLISON WOLFE   Sonographer Comments: Technically difficult due to respiratory interference and patients persistent coughing. IMPRESSIONS   1. Left ventricular ejection fraction, by estimation, is 55 to 60%. The left ventricle has normal function. The left ventricle has no regional wall motion abnormalities. There is mild left ventricular hypertrophy. Left ventricular diastolic parameters are consistent with Grade II diastolic dysfunction (pseudonormalization). 2. Right ventricular systolic function is normal. The right ventricular size is normal. There is moderately elevated pulmonary artery systolic pressure. 3. Left atrial size was moderately dilated. 4. Right atrial size was moderately dilated. 5. The mitral valve is grossly normal. Trivial mitral valve regurgitation. No evidence of mitral stenosis. 6. The aortic valve is grossly normal. There is mild calcification of the aortic valve. Aortic valve regurgitation is not visualized. Aortic valve sclerosis/calcification is present, without any evidence of aortic stenosis. 7. The inferior vena cava is dilated in size with >50% respiratory variability, suggesting right atrial pressure of 8 mmHg.  Comparison(s): No significant change from prior study.  Conclusion(s)/Recommendation(s): Grade 2 diastolic dysfunction, moderately elevated PA pressures.  FINDINGS Left Ventricle: Left ventricular ejection fraction, by estimation, is 55 to 60%. The left ventricle has normal function. The left ventricle has no regional wall motion abnormalities. Definity contrast  agent was given IV to delineate the left ventricular endocardial borders. The left ventricular internal cavity size was normal in size. There is mild left ventricular hypertrophy. Left ventricular diastolic parameters are consistent with Grade II diastolic dysfunction (pseudonormalization).  Right Ventricle: The right ventricular size is normal. Right vetricular wall thickness was not well visualized. Right ventricular systolic function is normal. There is moderately elevated pulmonary artery systolic pressure. The tricuspid regurgitant velocity is 3.24 m/s, and with an assumed right atrial pressure of 8 mmHg, the estimated right ventricular systolic pressure is 50.0 mmHg.  Left Atrium: Left atrial size was moderately dilated.  Right Atrium: Right atrial size was moderately dilated.  Pericardium: There is no evidence of  pericardial effusion.  Mitral Valve: The mitral valve is grossly normal. Mild to moderate mitral annular calcification. Trivial mitral valve regurgitation. No evidence of mitral valve stenosis.  Tricuspid Valve: The tricuspid valve is normal in structure. Tricuspid valve regurgitation is mild . No evidence of tricuspid stenosis.  Aortic Valve: The aortic valve is grossly normal. There is mild calcification of the aortic valve. Aortic valve regurgitation is not visualized. Aortic valve sclerosis/calcification is present, without any evidence of aortic stenosis. Aortic valve peak gradient measures 13.7 mmHg.  Pulmonic Valve: The pulmonic valve was not well visualized. Pulmonic valve regurgitation is trivial. No evidence of pulmonic stenosis.  Aorta: The aortic root, ascending aorta and aortic arch are all structurally normal, with no evidence of dilitation or obstruction.  Venous: The inferior vena cava is dilated in size with greater than 50% respiratory variability, suggesting right atrial pressure of 8 mmHg.  IAS/Shunts: The atrial septum is grossly normal.   LEFT  VENTRICLE PLAX 2D LVIDd:         5.70 cm Diastology LVIDs:         4.70 cm LV e' medial:    8.40 cm/s LV PW:         1.20 cm LV E/e' medial:  11.9 LV IVS:        1.40 cm LV e' lateral:   11.30 cm/s LV E/e' lateral: 8.8   IVC IVC diam: 3.00 cm  LEFT ATRIUM             Index        RIGHT ATRIUM           Index LA diam:        6.10 cm 2.73 cm/m   RA Area:     22.10 cm LA Vol (A2C):   90.4 ml 40.50 ml/m  RA Volume:   72.10 ml  32.30 ml/m LA Vol (A4C):   84.1 ml 37.68 ml/m LA Biplane Vol: 94.8 ml 42.48 ml/m AORTIC VALVE              PULMONIC VALVE AV Vmax:      185.00 cm/s PV Vmax:       1.23 m/s AV Peak Grad: 13.7 mmHg   PV Peak grad:  6.1 mmHg LVOT Vmax:    138.00 cm/s LVOT Vmean:   80.000 cm/s LVOT VTI:     0.261 m  AORTA Ao Root diam: 4.30 cm Ao Asc diam:  3.30 cm  MITRAL VALVE               TRICUSPID VALVE MV Area (PHT): 5.02 cm    TR Peak grad:   42.0 mmHg MV Decel Time: 151 msec    TR Vmax:        324.00 cm/s MV E velocity: 99.60 cm/s MV A velocity: 76.10 cm/s  SHUNTS MV E/A ratio:  1.31        Systemic VTI: 0.26 m  Jodelle Red MD Electronically signed by Jodelle Red MD Signature Date/Time: 12/24/2021/8:17:43 PM    Final             Assessment & Plan    1.  Surgical clearance: -   Mr. Rinehart perioperative risk of a major cardiac event is 11% according to the Revised Cardiac Risk Index (RCRI).  Therefore, he is at high risk for perioperative complications.   His functional capacity is poor at 4.73 METs according to the Duke Activity Status Index (DASI).   -We will obtain [employer]  to further stratify patient is risk for possible lumbar surgery.  We will reach out to Dr. Clifton James regarding guidance for holding antiplatelet therapy due to extensive cardiac history.  No clearance has been determined and addendum will be made pending results of Lexiscan Myoview and parameters for holding DAPT.     2.  Coronary artery disease: -s/p  ABG (LIMA to LAD, SVG to Circumflex, SVG to PDA) 2010, lateral STEMI 2017 with BMS to SVG to OM x 1 -Today patient reports no additional chest pain but does still report occasional bouts of dizziness. -Continue GDMT with Plavix 75 mg, ASA 81 mg, atorvastatin 80 mg, fenofibrate 145 mg, ezetimibe 10 mg, Imdur 90 mg daily   3.  Chronic diastolic CHF: -Patient's volume today is euvolemic with trace lower extremity edema today. -Most recent 2D echo completed with EF of 55-60% -Continue GDMT with valsartan 40 mg, Aldactone 12.5 mg, Lasix 20 mg, Farxiga 5 mg   4.  Essential hypertension: -Patient's blood pressure today was well-controlled at 108/76 -Continue spironolactone 12.5 mg and valsartan 40 mg daily   5.  Hyperlipidemia: -Patient's last LDL cholesterol was 60 at goal of less than 70 -Continue atorvastatin 80 mg, fenofibrate 145 mg, Zetia 10 mg   6.  Peripheral artery disease: -s/p previous endovascular intervention on the left SFA and most recent successful directional atherectomy and drug-coated balloon angioplasty of right SFA with recurrent claudication -Today patient reports that he is pending a follow-up lower extremity arteriogram in 6 months. -Continue atorvastatin, fenofibrate, Zetia, Plavix 75 mg, and ASA 81 mg   7.  Dizziness/bradycardia: -Patient reported dizziness and bradycardia and metoprolol was discontinued. -Today patient reports that his dizziness has improved since discontinuing gabapentin.      Disposition: Follow-up with Verne Carrow, MD or APP in *** months {Are you ordering a CV Procedure (e.g. stress test, cath, DCCV, TEE, etc)?   Press F2        :[id number]}   Medication Adjustments/Labs and Tests Ordered: Current medicines are reviewed at length with the patient today.  Concerns regarding medicines are outlined above.   Signed, Napoleon Form, Leodis Rains, NP 11/22/2022, 1:32 PM [facility]  Note:  This document was prepared  using Dragon voice recognition software and may include unintentional dictation errors.

## 2022-11-23 ENCOUNTER — Ambulatory Visit: Payer: Medicare HMO | Attending: Nurse Practitioner | Admitting: Nurse Practitioner

## 2022-11-23 ENCOUNTER — Encounter: Payer: Self-pay | Admitting: Nurse Practitioner

## 2022-11-23 VITALS — BP 108/60 | HR 79 | Ht 71.0 in | Wt 211.6 lb

## 2022-11-23 DIAGNOSIS — Z01810 Encounter for preprocedural cardiovascular examination: Secondary | ICD-10-CM | POA: Diagnosis not present

## 2022-11-23 MED ORDER — ALBUTEROL SULFATE HFA 108 (90 BASE) MCG/ACT IN AERS: 2.0000 | INHALATION_SPRAY | Freq: Four times a day (QID) | RESPIRATORY_TRACT | 2 refills | Status: DC | PRN

## 2022-11-23 NOTE — Patient Instructions (Addendum)
Medication Instructions:  START Albuterol inhaler take as needed RESTART Plavix and Aspirin once Homeostasis is achieved *If you need a refill on your cardiac medications before your next appointment, please call your pharmacy*   Lab Work: None ordered   Testing/Procedures: None ordered   Follow-Up: At [facility], you and your health needs are our priority.  As part of our continuing mission to provide you with exceptional heart care, we have created designated Provider Care Teams.  These Care Teams include your primary Cardiologist (physician) and Advanced Practice Providers (APPs -  Physician Assistants and Nurse Practitioners) who all work together to provide you with the care you need, when you need it.  We recommend signing up for the patient portal called "MyChart".  Sign up information is provided on this After Visit Summary.  MyChart is used to connect with patients for Virtual Visits (Telemedicine).  Patients are able to view lab/test results, encounter notes, upcoming appointments, etc.  Non-urgent messages can be sent to your provider as well.   To learn more about what you can do with MyChart, go to [email or web address].    Your next appointment:   6 month(s)  Provider:   Verne Carrow, MD  or  Robin Searing, NP  Other Instructions

## 2022-11-24 ENCOUNTER — Ambulatory Visit: Payer: Medicare HMO | Admitting: Cardiovascular Disease

## 2022-11-26 DIAGNOSIS — M5416 Radiculopathy, lumbar region: Secondary | ICD-10-CM | POA: Diagnosis not present

## 2022-11-27 ENCOUNTER — Telehealth: Payer: Self-pay

## 2022-11-30 ENCOUNTER — Ambulatory Visit: Payer: Self-pay | Admitting: Licensed Clinical Social Worker

## 2022-11-30 NOTE — Patient Instructions (Signed)
Visit Information  Thank you for taking time to visit with me today. Please don't hesitate to contact me if I can be of assistance to you.   Following are the goals we discussed today:   Goals Addressed             This Visit's Progress    Manage and reduce stress       Activities and task to complete in order to accomplish goals.   Keep all upcoming appointment discussed today  We will spend more time on ways to help with reducing your stress and managing your mental health needs        Our next appointment is by telephone on 12/01/22 at 2:00  Please call the care guide team at [phone] if you need to cancel or reschedule your appointment.   If you are experiencing a Mental Health or Behavioral Health Crisis or need someone to talk to, please call the Suicide and Crisis Lifeline: 988 call the [place] National Suicide Prevention Lifeline: 252 532 1591 or TTY: 249-422-7790 TTY 838-536-6984) to talk to a trained counselor call 1-800-273-TALK (toll free, 24 hour hotline) go to [facility] [address], [place] [phone])   Patient verbalizes understanding of instructions and care plan provided today and agrees to view in MyChart. Active MyChart status and patient understanding of how to access instructions and care plan via MyChart confirmed with patient.     [name], LCSW Social Work Care Coordination  [name] [phone]

## 2022-11-30 NOTE — Patient Outreach (Signed)
  Care Coordination  Follow Up Visit Note   11/30/2022 Name: [name]. MRN: [id number] DOB: May 12, 1961  [name]. is a 62 y.o. year old male who sees Drue Novel, Nolon Rod, MD for primary care. I spoke with  [name]. by phone today.  What matters to the patients health and wellness today?  Reducing stress    Goals Addressed             This Visit's Progress    Manage and reduce stress       Activities and task to complete in order to accomplish goals.   Keep all upcoming appointment discussed today  We will spend more time on ways to help with reducing your stress and managing your mental health needs        SDOH assessments and interventions completed:  Yes    11/30/2022    3:14 PM 06/09/2022   11:30 AM 04/29/2022    2:36 PM  PHQ9 SCORE ONLY  PHQ-9 Total Score 13 0 0    SDOH Interventions Today    Flowsheet Row Most Recent Value  SDOH Interventions   Depression Interventions/Treatment  Counseling  [will discuss all treatment options]       Care Coordination Interventions:  Yes, provided  Interventions Today    Flowsheet Row Most Recent Value  Chronic Disease   Chronic disease during today's visit Hypertension (HTN), Congestive Heart Failure (CHF), Chronic Obstructive Pulmonary Disease (COPD), Diabetes  General Interventions   General Interventions Discussed/Reviewed General Interventions Reviewed  [from previous referrals placed for RN care manager and pharmacy]  Mental Health Interventions   Mental Health Discussed/Reviewed Mental Health Discussed, Depression       Follow up plan: Follow up call scheduled for 12/01/22    Encounter Outcome:  Pt. Visit Completed   [name], LCSW Social Work Care Coordination  [name] [phone]

## 2022-12-01 ENCOUNTER — Ambulatory Visit: Payer: Self-pay | Admitting: Licensed Clinical Social Worker

## 2022-12-01 NOTE — Patient Outreach (Signed)
  Care Coordination  Follow Up Visit Note   12/01/2022 Name: [name]. MRN: [id number] DOB: [birth year]  [name]. is a 62 y.o. year old male who sees Drue Novel, Nolon Rod, MD for primary care. I spoke with  [name]. by phone today.  What matters to the patients health and wellness today?  Understanding his health and managing symptoms of depression  Patient is experiencing symptoms of depression which seems to be exacerbated by his chronic health concerns.. Reports no previous mental health treatment with therapy or medication.  Denies use of alcohol or cannabis to cope.  PHQ-9 from 11/30/22  was 13   Goals Addressed             This Visit's Progress    Manage and reduce stress       Activities and task to complete in order to accomplish goals.   Keep all upcoming appointment discussed today you have an appointment with Dr. Drue Novel May 1st I will talk with your CCM RN to assist with education on your CHF       SDOH assessments and interventions completed:  No   Care Coordination Interventions:  Yes, provided  Interventions Today    Flowsheet Row Most Recent Value  Chronic Disease   Chronic disease during today's visit Diabetes, Congestive Heart Failure (CHF), Hypertension (HTN), Chronic Obstructive Pulmonary Disease (COPD)  General Interventions   General Interventions Discussed/Reviewed General Interventions Reviewed, Doctor Visits, Communication with  Pearletha Furl barriers to managing mental and physical health care ( missed appointments, symptoms of depression, etc)]  Doctor Visits Discussed/Reviewed Doctor Visits Discussed  [assisted with scheduling appointment]  Communication with RN  [concerns with understanding chronic conditions ( CHF)]  Education Interventions   Education Provided Provided Education  Provided Verbal Education On Mental Health/Coping with Illness  Mental Health Interventions   Mental Health Discussed/Reviewed Mental Health Discussed,  Depression  [will talk with PCP about this at office visit]       Follow up plan: Follow up call scheduled for 12/15/22    Encounter Outcome:  Pt. Visit Completed   [name], LCSW Social Work Care Coordination  [name] [phone]

## 2022-12-01 NOTE — Patient Instructions (Signed)
Visit Information  Thank you for taking time to visit with me today. Please don't hesitate to contact me if I can be of assistance to you.   Following are the goals we discussed today:   Goals Addressed             This Visit's Progress    Manage and reduce stress       Activities and task to complete in order to accomplish goals.   Keep all upcoming appointment discussed today you have an appointment with Dr. Drue Novel May 1st I will talk with your CCM RN to assist with education on your CHF        Our next appointment is by telephone on 12/15/22 at 3:30  Please call the care guide team at [phone] if you need to cancel or reschedule your appointment.   If you are experiencing a Mental Health or Behavioral Health Crisis or need someone to talk to, please call the Suicide and Crisis Lifeline: 988 call the [place] National Suicide Prevention Lifeline: 361-016-4003 or TTY: 671-811-5723 TTY 787-539-9758) to talk to a trained counselor call 1-800-273-TALK (toll free, 24 hour hotline) go to [facility] [address], [place] [phone])   Patient verbalizes understanding of instructions and care plan provided today and agrees to view in MyChart. Active MyChart status and patient understanding of how to access instructions and care plan via MyChart confirmed with patient.     [name], LCSW Social Work Care Coordination  [name] [phone]

## 2022-12-02 ENCOUNTER — Other Ambulatory Visit: Payer: Self-pay | Admitting: Internal Medicine

## 2022-12-08 ENCOUNTER — Encounter: Payer: Self-pay | Admitting: Internal Medicine

## 2022-12-09 ENCOUNTER — Encounter: Payer: Self-pay | Admitting: Internal Medicine

## 2022-12-09 ENCOUNTER — Ambulatory Visit (INDEPENDENT_AMBULATORY_CARE_PROVIDER_SITE_OTHER): Payer: Medicare HMO | Admitting: Internal Medicine

## 2022-12-09 ENCOUNTER — Other Ambulatory Visit: Payer: Self-pay | Admitting: Cardiovascular Disease

## 2022-12-09 VITALS — BP 134/68 | HR 48 | Temp 97.5°F | Resp 20 | Ht 71.0 in | Wt 209.1 lb

## 2022-12-09 DIAGNOSIS — Z789 Other specified health status: Secondary | ICD-10-CM

## 2022-12-09 DIAGNOSIS — G4733 Obstructive sleep apnea (adult) (pediatric): Secondary | ICD-10-CM

## 2022-12-09 DIAGNOSIS — E785 Hyperlipidemia, unspecified: Secondary | ICD-10-CM | POA: Diagnosis not present

## 2022-12-09 DIAGNOSIS — I10 Essential (primary) hypertension: Secondary | ICD-10-CM

## 2022-12-09 DIAGNOSIS — F17210 Nicotine dependence, cigarettes, uncomplicated: Secondary | ICD-10-CM

## 2022-12-09 DIAGNOSIS — E1169 Type 2 diabetes mellitus with other specified complication: Secondary | ICD-10-CM

## 2022-12-09 DIAGNOSIS — E559 Vitamin D deficiency, unspecified: Secondary | ICD-10-CM

## 2022-12-09 DIAGNOSIS — Z7984 Long term (current) use of oral hypoglycemic drugs: Secondary | ICD-10-CM | POA: Diagnosis not present

## 2022-12-09 DIAGNOSIS — F320 Major depressive disorder, single episode, mild: Secondary | ICD-10-CM

## 2022-12-09 DIAGNOSIS — I5032 Chronic diastolic (congestive) heart failure: Secondary | ICD-10-CM | POA: Diagnosis not present

## 2022-12-09 NOTE — Progress Notes (Unsigned)
Subjective:    Patient ID: [name]., male    DOB: Jan 05, 1961, 62 y.o.   MRN: [id number]  DOS:  12/09/2022 Type of visit - description: Follow-up Chronic medical problems were evaluated. Previous labs reviewed. Preventive care reviewed. In general feeling about the same, has chronic cough, feeling fatigued. Our social worker sent me a message stating that the patient is depressed, the patient confirms that, he denies any suicidal or homicidal ideas. He is just "down" because he is unable to do much, live by himself.  PHQ-9: 13.  Still smokes a pack a day, admits to drinking alcohol, would not tell me how much.  Denies cocaine or other substances.     Review of Systems See above   Past Medical History:  Diagnosis Date   (HFpEF) heart failure with preserved ejection fraction (HCC)    Echo 6/22: EF 55-60, no RWMA, GR 3 DD, elevated LVEDP, normal RVSF, RVSP 53.2, mild to moderate AV sclerosis without stenosis, dilated aortic root (41 mm), ascending aorta 37 mm   Abnormality of gait    Anxiety    CAD (coronary artery disease)    a. 10/2008 Inferolateral STEMI: 3VD w/ occluded LCX (PTCA)-->CABG x 3 (LIMA->LAD, VG->OM, VG->PDA);  b. 06/2009 NSTEMI/PCI: VG->OM 100 (BMS);  c. 05/2013 native 3VD, 3/3 patent grafts. d. 06/2016: Inferior STEMI w/ occluded SVG-OM (DES placed). LIMA-LAD and SVG-PDA patent.   Chronic bronchitis (HCC)    Chronic diastolic CHF (congestive heart failure) (HCC)    a. 02/2015 Echo: EF 50-55%, distal septal, apical, inferobasal HK, mild LVH, mild MR, mildly dil LA.   Cocaine use    Colon polyps    a. 09/2015 colonscopy: multiple sessile polyps, path negative for high grade dysplasia.   Depression    Emphysema (subcutaneous) (surgical) resulting from a procedure 10/2008.   Bleb resected at time of 10/2008 CABG. Marketed emphysema noted on surgical report.   Esophagitis    a. 2017 EGD: esophagitis, duodenitis.   ETOH abuse    GERD (gastroesophageal reflux  disease)    Hemianopia, homonymous, right    "can't see out the right side of either eye since stroke in 2016/pt description 09/23/2016   Hepatic steatosis    Hiatal hernia    History of nuclear stress test 08/2017   Nuclear stress test 1/19: EF 35, inf-lat and ant-lat, apical sept/apical scar; no ischemia; Intermediate Risk   Hyperlipidemia    Hypertension    Iron deficiency anemia 2010   Morbid obesity (HCC)    Myocardial infarction [facility]) "several"   PAD (peripheral artery disease) (HCC)    Pancreatitis 06/2015   Sleep apnea    "don't have a mask" (09/23/2016)   Stroke (HCC) 02/2015   a. 02/2015 Carotid U/S: 1-39% bilat ICA stenosis; "left me blind" (09/23/2016)   Tobacco abuse    still smokes   Tubular adenoma of colon     Past Surgical History:  Procedure Laterality Date   ABDOMINAL AORTOGRAM W/LOWER EXTREMITY N/A 09/23/2016   Procedure: Abdominal Aortogram w/Lower Extremity;  Surgeon: Iran Ouch, MD;  Location: [facility] INVASIVE CV LAB;  Service: Cardiovascular;  Laterality: N/A;   ABDOMINAL AORTOGRAM W/LOWER EXTREMITY N/A 02/18/2022   Procedure: ABDOMINAL AORTOGRAM W/LOWER EXTREMITY;  Surgeon: Iran Ouch, MD;  Location: [facility] INVASIVE CV LAB;  Service: Cardiovascular;  Laterality: N/A;   CARDIAC CATHETERIZATION  11/07/2008   EF of 50-55% -- normal LV systolic function, acute inferolateral ST elevation MI,  PTCA of the circumflex artery --  Colleen Can. Deborah Chalk, [name]   CARDIAC CATHETERIZATION N/A 07/07/2016   Procedure: Left Heart Cath and Cors/Grafts Angiography;  Surgeon: Peter M Swaziland, MD;  Location: [facility] INVASIVE CV LAB;  Service: Cardiovascular;  Laterality: N/A;   CARDIAC CATHETERIZATION N/A 07/07/2016   Procedure: Coronary Stent Intervention;  Surgeon: Peter M Swaziland, MD;  Location: [facility] INVASIVE CV LAB;  Service: Cardiovascular;  Laterality: N/A;   CORONARY ANGIOPLASTY     CORONARY ARTERY BYPASS GRAFT     "CABG X4"   LEFT HEART CATH AND CORS/GRAFTS ANGIOGRAPHY N/A 10/01/2017    Procedure: LEFT HEART CATH AND CORS/GRAFTS ANGIOGRAPHY;  Surgeon: Kathleene Hazel, MD;  Location: [facility] INVASIVE CV LAB;  Service: Cardiovascular;  Laterality: N/A;   LEFT HEART CATHETERIZATION WITH CORONARY/GRAFT ANGIOGRAM N/A 05/15/2013   Procedure: LEFT HEART CATHETERIZATION WITH Isabel Caprice;  Surgeon: Peter M Swaziland, MD;  Location: [facility] CATH LAB;  Service: Cardiovascular;  Laterality: N/A;   PERIPHERAL VASCULAR ATHERECTOMY  02/18/2022   Procedure: PERIPHERAL VASCULAR ATHERECTOMY;  Surgeon: Iran Ouch, MD;  Location: [facility] INVASIVE CV LAB;  Service: Cardiovascular;;   PERIPHERAL VASCULAR INTERVENTION  09/23/2016   Procedure: Peripheral Vascular Intervention;  Surgeon: Iran Ouch, MD;  Location: [facility] INVASIVE CV LAB;  Service: Cardiovascular;;  Lt. SFA    Current Outpatient Medications  Medication Instructions   albuterol (VENTOLIN HFA) 108 (90 Base) MCG/ACT inhaler 2 puffs, Inhalation, Every 6 hours PRN   aspirin EC 81 mg, Oral, Daily with breakfast   atorvastatin (LIPITOR) 80 mg, Oral, Daily-1800   budesonide-formoterol (SYMBICORT) 160-4.5 MCG/ACT inhaler 2 puffs, Inhalation, 2 times daily   clopidogrel (PLAVIX) 75 MG tablet TAKE 1 TABLET EVERY DAY   dapagliflozin propanediol (FARXIGA) 5 mg, Oral, Daily before breakfast   ezetimibe (ZETIA) 10 mg, Oral, Daily   fenofibrate (TRICOR) 145 mg, Oral, Daily   folic acid (FOLVITE) 1 mg, Oral, Daily   guaiFENesin (MUCINEX) 600 mg, Oral, 2 times daily PRN   isosorbide mononitrate (IMDUR) 60 MG 24 hr tablet TAKE 1 AND 1/2 TABLETS EVERY DAY   oxyCODONE-acetaminophen (PERCOCET) 10-325 MG tablet 1 tablet, Oral, Every 6 hours PRN   OXYGEN 2-4 L/min, Inhalation, As needed   pantoprazole (PROTONIX) 40 MG tablet TAKE 1 TABLET TWICE DAILY   PRESCRIPTION MEDICATION CPAP- At bedtime   spironolactone (ALDACTONE) 25 MG tablet TAKE 1/2 TABLET EVERY DAY   tamsulosin (FLOMAX) 0.4 mg, Oral, 2 times daily   thiamine (VITAMIN B1) 100 mg,  Oral, Daily   traZODone (DESYREL) 100 mg, Oral, Daily at bedtime   valsartan (DIOVAN) 40 mg, Oral, Daily   Vitamin B-12 5,000 mcg, Sublingual, Daily   Vitamin D-3 5,000 Units, Oral, Daily       Objective:   Physical Exam BP 134/68   Pulse (!) 48   Temp (!) 97.5 F (36.4 C) (Oral)   Resp 20   Ht 5\' 11"  (1.803 m)   Wt 209 lb 2 oz (94.9 kg)   SpO2 93%   BMI 29.17 kg/m  General:   Well developed, NAD, BMI noted. HEENT:  Normocephalic . Face symmetric, atraumatic Lungs:  Decreased breath sounds, + large airway congestion. Normal respiratory effort, no intercostal retractions, no accessory muscle use. Heart: RRR,  no murmur.  Lower extremities: no pretibial edema bilaterally  Skin: Not pale. Not jaundice Neurologic:  alert & oriented X3.  Speech normal, gait appropriate for age and unassisted Psych--  Cognition and judgment appear intact.  Cooperative with normal attention span and concentration.  Behavior appropriate.  No anxious or depressed appearing.      Assessment     ASSESSMENT DM HTN Dyslipidemia CV: --CAD: 2010 angioplasty f/u by  CABG 2010, cath 2014 -- CP, cath 09/2017, patent grafts, medical treatment --PVD --Stroke 2008, 02-2015, 2020 -CHF: Heart failure with preserved EF Anxiety depression Pulm: ---OSA: Sleep study 01/2017, significant sleep apnea noted --- hypoxemia (nocturnal O2) ---COPD: Clinical diagnosis and CT findings, has not pursued PFTs Abnormal gait GI: Iron def anemia dx 03-2015  ---cscope 09-2015 multiple polyps, repeat in one year ---EGD-2017: Esophagitis, duodenitis, bx done Pancreatitis 06/2015.(EtOH 2 days prior to onset of symptoms) Etoh abuse H/o cocaine  Smoker  +FH: father prostate ca , dx late 60s Back pain:saw Ortho 01/2020, SXS felt to be mechanical due to: JD, deconditioning, residual weakness from the stroke.  PVD likely to be contributing. Vitamin D deficiency    PLAN BMP CBC A1c  DM: On Farxiga, check A1c. HTN: BP  today satisfactory, continue Imdur, Aldactone, Diovan.  Check a CBC.  CAD, CHF: Cardiology visit 11/23/2022: At the time reported no chest pain, was noted to be high risk for surgery. Was recommended to continue medications including fenofibrate. He was euvolemic. Blood pressure was noted to be slightly low. Previously patient complained of dizziness, better after metoprolol was discontinued. Check BMP. Dyslipidemia: On Zetia, Tricor, Lipitor.  Not sure if he is taking Tricor.  See AVS. Depression: Counselor? Declines  Medications d/w pt, he is hesitant, he will let me know if he would like to start a SSRI. Substance abuse: Still smokes, still drinks, how much?.  Denies other drugs. Quit tobacco d/w pt  Back pain: Reports he is in need of surgery. Vitamin D deficiency: Encourage supplements, checking labs. COPD: Reports he is intolerant to CPAP.  Benefits discussed Nocturnal hypoxia: Reports he uses oxygen as needed, uses it most nights. Other issues: Reports he needs to see a dentist, see AVS. RTC 3 months   Tdap 2018 PNM 23 2019 Vaccines I recommend: RSV, Shingrix, COVID booster if not done by 04-2022, flu shot every fall.  States that he is not inclined to do any vaccines. CCS: Had a colonoscopy 09/13/2015, multiple polyps, next 1 year.  He is overdue, explained that if he has developed more polyps, they could be precancerous.  States he is not interested at this point.. Prostate cancer screening: All previous PSAs normal, last 2021.  Reassess on RTC

## 2022-12-09 NOTE — Patient Instructions (Addendum)
Please reach out to the dental office for your dental needs: [phone]   Vaccines I recommend: Shingrix (shingles) RSV vaccine COVID-vaccine if not done by 04-2022 Flu shot every fall  Use your oxygen every night.  Please read the medication list provided and call if you need any refill.  GO TO THE LAB : Get the blood work     GO TO THE FRONT DESK, PLEASE SCHEDULE YOUR APPOINTMENTS Come back for   a checkup in 3 months     Per our records you are due for your diabetic eye exam. Please contact your eye doctor to schedule an appointment. Please have them send copies of your office visit notes to [place]. Our fax number is [phone]. If you need a referral to an eye doctor please let us know.   Please bring [place] a copy of your Healthcare Power of Attorney for your chart.

## 2022-12-10 LAB — CBC WITH DIFFERENTIAL/PLATELET
Basophils Absolute: 0.1 10*3/uL (ref 0.0–0.1)
Basophils Relative: 1 % (ref 0.0–3.0)
Eosinophils Absolute: 0.1 10*3/uL (ref 0.0–0.7)
Eosinophils Relative: 1.5 % (ref 0.0–5.0)
HCT: 46.2 % (ref 39.0–52.0)
Hemoglobin: 15.6 g/dL (ref 13.0–17.0)
Lymphocytes Relative: 16.8 % (ref 12.0–46.0)
Lymphs Abs: 1.1 10*3/uL (ref 0.7–4.0)
MCHC: 33.7 g/dL (ref 30.0–36.0)
MCV: 90.5 fl (ref 78.0–100.0)
Monocytes Absolute: 0.7 10*3/uL (ref 0.1–1.0)
Monocytes Relative: 11 % (ref 3.0–12.0)
Neutro Abs: 4.6 10*3/uL (ref 1.4–7.7)
Neutrophils Relative %: 69.7 % (ref 43.0–77.0)
Platelets: 162 10*3/uL (ref 150.0–400.0)
RBC: 5.1 Mil/uL (ref 4.22–5.81)
RDW: 19.6 % — ABNORMAL HIGH (ref 11.5–15.5)
WBC: 6.6 10*3/uL (ref 4.0–10.5)

## 2022-12-10 LAB — BASIC METABOLIC PANEL
BUN: 12 mg/dL (ref 6–23)
CO2: 26 mEq/L (ref 19–32)
Calcium: 9.2 mg/dL (ref 8.4–10.5)
Chloride: 102 mEq/L (ref 96–112)
Creatinine, Ser: 1.09 mg/dL (ref 0.40–1.50)
GFR: 73.93 mL/min (ref >60.00)
Glucose, Bld: 117 mg/dL — ABNORMAL HIGH (ref 70–99)
Potassium: 4.1 mEq/L (ref 3.5–5.1)
Sodium: 136 mEq/L (ref 135–145)

## 2022-12-10 LAB — VITAMIN D 25 HYDROXY (VIT D DEFICIENCY, FRACTURES): VITD: 36.72 ng/mL (ref 30.00–100.00)

## 2022-12-10 LAB — HEMOGLOBIN A1C: Hgb A1c MFr Bld: 6.7 % — ABNORMAL HIGH (ref 4.6–6.5)

## 2022-12-10 NOTE — Assessment & Plan Note (Signed)
Tdap 2018 PNM 23 2019 Vaccines I recommend: RSV, Shingrix, COVID booster if not done by 04-2022, flu shot every fall.  States that he is not inclined to do any vaccines. CCS: Had a colonoscopy 09/13/2015, multiple polyps, next was due 2018,  explained that if he has developed more polyps, they could be precancerous.  States he is not interested in a f/u cscope. Prostate cancer screening: All previous PSAs normal, last 2021.  Reassess on RTC

## 2022-12-10 NOTE — Assessment & Plan Note (Signed)
DM: On Farxiga, check A1c. HTN: BP today satisfactory, continue Imdur, Aldactone, Diovan.  Check a CBC. CAD, CHF: Cardiology visit 11/23/2022: At the time reported no chest pain, was noted to be high risk for surgery. Was recommended to continue medications including fenofibrate. He was euvolemic. Blood pressure was noted to be slightly low. Previously patient complained of dizziness, better after metoprolol was discontinued. Check BMP. Dyslipidemia: On Zetia, Tricor, Lipitor.  Pt is not sure if he is taking Tricor.  See AVS. Depression: Counselor? Declines  Medications d/w pt, he is hesitant, he will let me know if he would like to start a SSRI. Substance abuse: Still smokes (counseled), still drinks, how much?.  Denies other drugs.   Back pain: managed elesewhere Vitamin D deficiency: Encourage supplements, checking labs. OSA-  Reports he is intolerant to CPAP.  Benefits discussed Nocturnal hypoxia: Reports he uses oxygen as needed, uses it most nights. Other issues: Reports he needs to see a dentist, see AVS. Preventive care is reviewed, see separate documentation RTC 3 months

## 2022-12-15 ENCOUNTER — Telehealth: Payer: Self-pay | Admitting: Licensed Clinical Social Worker

## 2022-12-15 ENCOUNTER — Encounter: Payer: Self-pay | Admitting: Licensed Clinical Social Worker

## 2022-12-15 NOTE — Patient Instructions (Signed)
  I am sorry you were unable to keep your phone appointment today.   Please call me to reschedule  Kenniyah Sasaki, LCSW Social Work Care Coordination  [phone]  

## 2022-12-15 NOTE — Patient Outreach (Signed)
  Care Coordination   12/15/2022 Name: [name]. MRN: [id number] DOB: September 01, 1960   Care Coordination Outreach Attempts:  An unsuccessful telephone outreach was attempted for a scheduled appointment today.  Follow Up Plan:  Additional outreach attempts will be made to offer the patient care coordination information and services.   Encounter Outcome:  No Answer   Care Coordination Interventions:  Yes, provided   Interventions Today    Flowsheet Row Most Recent Value  Chronic Disease   Chronic disease during today's visit Hypertension (HTN), Congestive Heart Failure (CHF), Chronic Obstructive Pulmonary Disease (COPD), Diabetes  General Interventions   General Interventions Discussed/Reviewed Communication with  Communication with PCP/Specialists       [name], LCSW Social Work Care Coordination  [name] [phone]

## 2022-12-29 ENCOUNTER — Ambulatory Visit: Payer: Self-pay | Admitting: Licensed Clinical Social Worker

## 2022-12-29 ENCOUNTER — Other Ambulatory Visit (INDEPENDENT_AMBULATORY_CARE_PROVIDER_SITE_OTHER): Payer: Medicare HMO | Admitting: Pharmacist

## 2022-12-29 ENCOUNTER — Telehealth: Payer: Self-pay | Admitting: Pharmacist

## 2022-12-29 DIAGNOSIS — E1169 Type 2 diabetes mellitus with other specified complication: Secondary | ICD-10-CM

## 2022-12-29 DIAGNOSIS — E785 Hyperlipidemia, unspecified: Secondary | ICD-10-CM

## 2022-12-29 DIAGNOSIS — I5032 Chronic diastolic (congestive) heart failure: Secondary | ICD-10-CM

## 2022-12-29 MED ORDER — TRAZODONE HCL 100 MG PO TABS: 100.0000 mg | ORAL_TABLET | Freq: Every day | ORAL | 0 refills | Status: DC

## 2022-12-29 NOTE — Progress Notes (Signed)
Pharmacy Note  12/29/2022 Name: [name]. MRN: [id number] DOB: [birth year]  Subjective: [name]. is a 62 y.o. year old male who is a primary care patient of Wanda Plump, MD. Clinical Pharmacist Practitioner referral was placed to assist with medication and CHF and diabetes management.    Engaged with patient's sister because I was unable to reach patient  in response to request from Care Coordination social worker, [name].   CHF/ HTN:  Current therapy: Farxiga 5mg  daily, valsartan 40mg  daily Spironolactone 25mg  0.5 tablet daily, (on his medication list but he is not taking)  Checking weight daily. His current weight is 208lbs (down from 232lbs since September 2024)   His sister states her med list has metoprolol (its from last year) but she noticed that metoprolol is not on his [facility] med list or MyChart current med list.  Metoprolol was stopped in 2023 due to low blood pressure. Last refill for metoprolol was February of 2023 / last year for #180 so even if patient did not stop as recommended he should not have any left on hand.   Type 2 DM:  Current therapy - taking Farxiga 5mg  daily but is more for CHF.  Great weight loss since starting [place]  Hyperlipidemia: last LDL was at goal @ 60 and triglycerides were 101. Patient is taking atorvastatin 80mg  daily, ezetimibe 10mg  daily and fenofibrate 160mg  daily.   His sister would like to know if he could stop one of the 3 meds he is taking for lipids.   Medication Management:  Reviewed Epic records for Trazodone refill. See below. Was refilled 02/21 and per [facility] mailed to patient 02/23 so he should have at least a few tablets to last him.  Dispenses   Dispensed Days Supply Quantity Provider Pharmacy  trazodone 100 mg tablet 12/28/2022 90 90 tablet [name], Nolon Rod, MD [facility] Mail D...  trazodone 100 mg tablet 09/30/2022 90 90 tablet Wanda Plump, MD [facility] Mail D...   trazodone 100 mg tablet 07/15/2022 90 90 tablet Wanda Plump, MD [facility] Mail D...  trazodone 100 mg tablet 04/29/2022 90 90 tablet Wanda Plump, MD [facility] Mail D...  trazodone 100 mg tablet 02/09/2022 90 90 tablet Wanda Plump, MD [facility] Mail D...          SDOH (Social Determinants of Health) assessments and interventions performed:  SDOH Interventions    Flowsheet Row Care Coordination from 11/30/2022 in [facility] Coordination Care Coordination from 11/10/2022 in [facility] Community Care Coordination Chronic Care Management from 06/09/2022 in [facility] Chronic Care Management from 03[facility]17/2022 in [facility] at [facility] Chronic Care Management from 02/04/2022 in [facility] Primary Care at [facility] Chronic Care Management from 01/15/2022 in [facility] Primary Care at [facility]  SDOH Interventions        Food Insecurity Interventions -- Intervention Not Indicated Intervention Not Indicated -- Intervention Not Indicated --  Housing Interventions -- Intervention Not Indicated Intervention Not Indicated -- Intervention Not Indicated --  Transportation Interventions -- Intervention Not Indicated Intervention Not Indicated -- Intervention Not Indicated --  Utilities Interventions -- Intervention Not Indicated Intervention Not Indicated -- -- --  Depression Interventions/Treatment  Counseling  [will discuss all treatment options] -- -- -- -- --  Financial Strain Interventions -- -- -- --  Intervention Not Indicated --  Physical Activity Interventions -- -- -- -- Patient Refused Patient Refused, Other (Comments)  [limited by leg pain /COPD]  Stress Interventions -- Provide Counseling -- -- Intervention Not Indicated --  Social Connections Interventions -- -- -- Intervention Not Indicated Intervention Not Indicated --         Objective: Review of patient status, including review of consultants reports, laboratory and other test data, was performed as part of comprehensive evaluation and provision of chronic care management services.   Lab Results  Component Value Date   CREATININE 1.09 12/09/2022   CREATININE 1.10 04/29/2022   CREATININE 1.09 02/05/2022    Lab Results  Component Value Date   HGBA1C 6.7 (H) 12/09/2022       Component Value Date/Time   CHOL 127 04/29/2022 1515   CHOL 171 11/14/2019 1441   TRIG 101.0 04/29/2022 1515   HDL 47.20 04/29/2022 1515   HDL 37 (L) 11/14/2019 1441   CHOLHDL 3 04/29/2022 1515   VLDL 20.2 04/29/2022 1515   LDLCALC 60 04/29/2022 1515   LDLCALC 116 (H) 11/14/2019 1441   LDLDIRECT 116.0 05/12/2013 1517     Clinical ASCVD: Yes  The ASCVD Risk score (Arnett DK, et al., 2019) failed to calculate for the following reasons:   The patient has a prior MI or stroke diagnosis    BP Readings from Last 3 Encounters:  12/09/22 134/68  11/23/22 108/60  08/28/22 108/76     Allergies  Allergen Reactions   Metformin And Related Diarrhea and Other (See Comments)    Severe diarrhea   Wellbutrin [Bupropion] Nausea Only    Medications Reviewed Today     Reviewed by Henrene Pastor, RPH-CPP (Pharmacist) on 12/29/22 at 1445  Med List Status: <None>   Medication Order Taking? Sig Documenting Provider Last Dose Status Informant  albuterol (VENTOLIN HFA) 108 (90 Base) MCG/ACT inhaler [id number] Yes Inhale 2 puffs into the lungs every 6 (six) hours as needed for wheezing or shortness of breath. Gaston Islam., NP Taking Active   aspirin EC 81 MG EC tablet [id number] Yes Take 1 tablet (81 mg total) by mouth daily with breakfast. Shon Hale, MD Taking Active Multiple Informants  atorvastatin (LIPITOR) 80 MG tablet [id number] Yes Take 1 tablet (80 mg total) by mouth daily at 6 PM. Kathleene Hazel, MD Taking Active   budesonide-formoterol [facility]) 160-4.5  MCG/ACT inhaler [id number] Yes Inhale 2 puffs into the lungs 2 (two) times daily. Wanda Plump, MD Taking Active Multiple Informants  Cholecalciferol (VITAMIN D-3) 125 MCG (5000 UT) TABS [id number] Yes Take 5,000 Units by mouth daily. [provider] Taking Active Multiple Informants  clopidogrel (PLAVIX) 75 MG tablet [id number] Yes TAKE 1 TABLET EVERY DAY Kathleene Hazel, MD Taking Active   Cyanocobalamin (VITAMIN B-12) 5000 MCG SUBL [id number] Yes Place 5,000 mcg under the tongue daily. [provider] Taking Active Multiple Informants           Med Note (CANTER, Vicente Males D   Wed Dec 09, 2022  2:22 PM) Sometimes  dapagliflozin propanediol (FARXIGA) 5 MG TABS tablet [id number] Yes Take 1 tablet (5 mg total) by mouth daily before breakfast. Wanda Plump, MD Taking Active   ezetimibe (ZETIA) 10 MG tablet [id number] Yes Take 1 tablet (10 mg total) by mouth daily. Kathleene Hazel, MD Taking Active   fenofibrate (TRICOR) 145 MG tablet [id number] Yes Take 1 tablet (145 mg total) by mouth daily. Wanda Plump, MD Taking Active  folic acid (FOLVITE) 1 MG tablet [id number] Yes Take 1 tablet (1 mg total) by mouth daily. Shon Hale, MD Taking Active Multiple Informants  guaiFENesin (MUCINEX) 600 MG 12 hr tablet [id number] Yes Take 600 mg by mouth 2 (two) times daily as needed for cough. [provider] Taking Active            Med Note (CANTER, Vicente Males D   Wed Dec 09, 2022  2:23 PM) PRN  isosorbide mononitrate (IMDUR) 60 MG 24 hr tablet [id number] Yes TAKE 1 AND 1/2 TABLETS EVERY DAY Kathleene Hazel, MD Taking Active   oxyCODONE-acetaminophen (PERCOCET) 10-325 MG tablet [id number] Yes Take 1 tablet by mouth every 6 (six) hours as needed for pain. [provider] Taking Active   OXYGEN [id number] Yes Inhale 2-4 L/min into the lungs as needed (for shortness of breath). [provider] Taking Active Multiple Informants           Med Note Loreli Dollar, Holli Humbles Apr 29, 2022  2:33 PM) PRN  pantoprazole (PROTONIX) 40 MG tablet [id number] Yes TAKE 1 TABLET TWICE DAILY Kathleene Hazel, MD Taking Active   PRESCRIPTION MEDICATION [id number] Yes CPAP- At bedtime [provider] Taking Active Multiple Informants  spironolactone (ALDACTONE) 25 MG tablet [id number] No TAKE 1/2 TABLET EVERY DAY  Patient not taking: Reported on 12/29/2022   Kathleene Hazel, MD Not Taking Active   tamsulosin [facility]) 0.4 MG CAPS capsule [id number] Yes Take 1 capsule (0.4 mg total) by mouth in the morning and at bedtime. Wanda Plump, MD Taking Active   thiamine 100 MG tablet [id number] Yes Take 1 tablet (100 mg total) by mouth daily. Shon Hale, MD Taking Active Multiple Informants  traZODone (DESYREL) 100 MG tablet [id number] No Take 1 tablet (100 mg total) by mouth at bedtime.  Patient not taking: Reported on 12/29/2022   Wanda Plump, MD Not Taking Active   valsartan (DIOVAN) 40 MG tablet [id number] Yes Take 1 tablet (40 mg total) by mouth daily. Wanda Plump, MD Taking Active             Patient Active Problem List   Diagnosis Date Noted   Palliative care encounter 04/13/2022   COPD (chronic obstructive pulmonary disease) (HCC) 04/07/2022   Class 1 obesity 12/25/2021   Acute on chronic diastolic heart failure (HCC) 12/23/2021   Chronic respiratory failure with hypoxia (HCC) 12/23/2021   COPD exacerbation (HCC) 12/23/2021   Alcohol use 12/23/2021   (HFpEF) heart failure with preserved ejection fraction (HCC)    DOE (dyspnea on exertion) 11/07/2020   Type 2 diabetes mellitus with hyperlipidemia (HCC) 03/18/2020   Prolapsed lumbar disc 01/25/2020   Hiatal hernia    Hepatic steatosis    Hemianopia, homonymous, right    ETOH abuse    Esophagitis    Chronic diastolic CHF (congestive heart failure) (HCC)    Chronic bronchitis (HCC)    CAD (coronary artery disease)    PAD (peripheral artery disease) (HCC) 09/23/2016   Morbid obesity (HCC)     Chronic pain syndrome 10/23/2015   GERD (gastroesophageal reflux disease) 08/07/2015   PCP NOTES >>>>> 06/01/2015   Annual physical exam 05/31/2015   Acute ischemic stroke (HCC) 02/20/2015   BPH (benign prostatic hyperplasia) 02/20/2015   OSA 05/05/2013   Erectile dysfunction 03/17/2012   Emphysema (subcutaneous) (surgical) resulting from a procedure 10/08/2008   Iron deficiency anemia 08/10/2008   ABNORMALITY OF GAIT 08/06/2008   Cigarette smoker 07/27/2008  Essential hypertension 07/26/2008   ST elevation myocardial infarction involving left circumflex coronary artery (HCC) 07/26/2008     Medication Assistance:  [name] obtained through [facility] and Me medication assistance program.  Sent in updated Rx for Farxiga to [facility]. Enrollment ends 08/10/2023 - pateint should received shipment about 1 week.    Assessment / Plan:  CHF - stable Continue Farxiga and valsartan Reviewed signs and symptoms of CHF exacerbation - weight gain, SOB, abdominal fullness, swelling in legs or abdomen, Fatigue and weakness, changes in ability to perform usual activities, persistent cough or wheezing with white or pink blood-tinged mucus, nausea and lack of appetite Continue to weigh daily - report weight gain of more than 3 lbs in 24 hours or 5 lbs in 1 week.  Regarding metoprolol - this medication was discontinued in 2023 due to low blood pressure. He has not filled it since 09/2021 and should not have any left that he could take.  Patient's sister is advised if he has any metoprolol at home to discontinue.   Type 2 DM - at goal, [place] mostly for CHF Continue Farxiga 5mg  daily Ok to not check blood glucose at home at this time. IF A1c increases to > 7% would consider glucometer.   Hyperlipidemia - at goal of LDL < 70. Continue ezetimibe, atorvastatin 80mg  daily and fenofibrate.  Could consider holding fenofibrate to see if Tg remain at goal of < 150 - will discuss with patient more during appt  01/12/2023  Med Management:  Reviewed Rx and over-the-counter meds. Updated med list.  Reviewed refill history and adherence.  Called [facility] and they show trazodone refill is in progress and patient should receive in 5 to 7 business days from 12/28/2022.   Will ask PCP about calling in 7 day supply to local pharmacy / [facility] to last until his order is received. Patient will have to purchase off insurance.   Follow Up:  Telephone follow up appointment with care management team member scheduled for:  01/12/2023 Next PCP appointment scheduled for:  03/15/2023   Henrene Pastor, PharmD Clinical Pharmacist [facility]  - [facility] [phone]

## 2022-12-29 NOTE — Patient Instructions (Signed)
Social Work Visit Information  Thank you for taking time to visit with me today. Please don't hesitate to contact me if I can be of assistance to you.   Following are the goals we discussed today:   Goals Addressed             This Visit's Progress    Manage and reduce stress       Activities and task to complete in order to accomplish goals.   Keep all upcoming appointment discussed today you have an appointment with Dr. Drue Novel May 1st I will talk with your CCM RN to assist with education on your CHF I have contacted Tammy, Pharmacy, she will call you to address concerns about your medication.        Our next appointment is by telephone on 01/06/23 at 2:00   Please call the care guide team at [phone] if you need to cancel or reschedule your appointment.   If you or anyone you know are experiencing a Mental Health or Behavioral Health Crisis or need someone to talk to, please call the Suicide and Crisis Lifeline: 988 call the [place] National Suicide Prevention Lifeline: 4408793335 or TTY: (905)560-6523 TTY 213-220-2127) to talk to a trained counselor call 1-800-273-TALK (toll free, 24 hour hotline) go to [facility] [address] [place] [phone])   Patient verbalizes understanding of instructions and care plan provided today and agrees to view in MyChart. Active MyChart status and patient understanding of how to access instructions and care plan via MyChart confirmed with patient.       [name], LCSW Social Work Care Coordination  [name] [phone]

## 2022-12-29 NOTE — Patient Outreach (Signed)
  Care Coordination  Follow Up Visit Note   12/29/2022 Name: [name]. MRN: [id number] DOB: April 05, 1961  Kenneth Hare. is a 62 y.o. year old male who sees Drue Novel, Nolon Rod, MD for primary care. I spoke with  Kenneth Hare. by phone today.  What matters to the patients health and wellness today?  He ran out of the TraZADone and unable to refill it for one week,  states he is also about to run out of  Metoprolol however this is not listed on his med. List and he does not remember who filled it.  Reports the took melatonin last night to help with sleep.  Unable to tell me how many mg he took Information discussed and shared with CCM Pharmacy Tammy.  She will contact patient to address his concerns.   Goals Addressed             This Visit's Progress    Manage and reduce stress       Activities and task to complete in order to accomplish goals.   Keep all upcoming appointment discussed today you have an appointment with Dr. Drue Novel May 1st I will talk with your CCM RN to assist with education on your CHF I have contacted Tammy, Pharmacy, she will call you to address concerns about your medication.       SDOH assessments and interventions completed:  No   Care Coordination Interventions:  Yes, provided  Interventions Today    Flowsheet Row Most Recent Value  Chronic Disease   Chronic disease during today's visit Hypertension (HTN), Congestive Heart Failure (CHF), Chronic Obstructive Pulmonary Disease (COPD), Diabetes  General Interventions   General Interventions Discussed/Reviewed General Interventions Reviewed, Communication with  [solution focused, assessment of needs related to medication]  Communication with Pharmacists  Pharmacy Interventions   Pharmacy Dicussed/Reviewed Pharmacy Topics Discussed, Medication Adherence  Medication Adherence Unable to refill medication       Follow up plan: Follow up call scheduled for 01/06/23    Encounter Outcome:  Pt. Visit  Completed   [name], LCSW Social Work Care Coordination  [name] [phone]

## 2022-12-29 NOTE — Telephone Encounter (Signed)
Reviewed Epic records for Trazodone refill. See below. Was refilled 02/21 and per [facility] mailed to patient 02/23 so he should have at least a few tablets to last him. Called [facility] and they show trazodone refill is in progress and patient should receive in 5 to 7 business days from 12/28/2022.   Will ask PCP about calling in 7 day supply to local pharmacy / [facility] to last until his order is received. Patient will have to purchase off insurance.   Dispenses   Dispensed Days Supply Quantity Provider Pharmacy  trazodone 100 mg tablet 12/28/2022 90 90 tablet [name], Nolon Rod, MD [facility] Mail D...  trazodone 100 mg tablet 09/30/2022 90 90 tablet Wanda Plump, MD [facility] Mail D...  trazodone 100 mg tablet 07/15/2022 90 90 tablet Wanda Plump, MD [facility] Mail D...  trazodone 100 mg tablet 04/29/2022 90 90 tablet Wanda Plump, MD [facility] Mail D...  trazodone 100 mg tablet 02/09/2022 90 90 tablet Wanda Plump, MD [facility] Mail D...         Regarding metoprolol - this medication was discontinued in 2023 due to low blood pressure. He has not filled it since 09/2021 and should not have any left that he could take.   I tried to contact patient to discuss - no answer. LM on VM for patient to call me back to discuss [phone] or [phone]  I did speak with his sister. She will check to make sure that he is not taking metoprolol. I have follow up with him again.

## 2022-12-29 NOTE — Telephone Encounter (Signed)
-----   Message from Soundra Pilon, LCSW sent at 12/29/2022  1:55 PM EDT ----- Hi [name] Patient  ran out of the TraZADone and unable to refill it for one week,  He also reports he is about to run out of  Metoprolol however. This is not listed on his med. List and he does not remember who filled it.  Reports the took melatonin last night to help with sleep.  Unable to tell me how many mg he took.   Please try to assist him.  Let me know if you need me to do anything  Soundra Pilon, LCSW

## 2023-01-06 ENCOUNTER — Encounter: Payer: Self-pay | Admitting: Internal Medicine

## 2023-01-06 ENCOUNTER — Other Ambulatory Visit: Payer: Self-pay | Admitting: Internal Medicine

## 2023-01-06 ENCOUNTER — Ambulatory Visit: Payer: Self-pay | Admitting: Licensed Clinical Social Worker

## 2023-01-06 MED ORDER — TRAZODONE HCL 100 MG PO TABS: 50.0000 mg | ORAL_TABLET | Freq: Every day | ORAL | 0 refills | Status: DC

## 2023-01-06 MED ORDER — ESCITALOPRAM OXALATE 10 MG PO TABS
10.0000 mg | ORAL_TABLET | Freq: Every day | ORAL | 3 refills | Status: DC
Start: 2023-01-06 — End: 2023-06-07

## 2023-01-06 NOTE — Patient Instructions (Signed)
Social Work Visit Information  Thank you for taking time to visit with me today. Please don't hesitate to contact me if I can be of assistance to you.   Following are the goals we discussed today:   Goals Addressed             This Visit's Progress    Manage and reduce symptoms of depression       Activities and task to complete in order to accomplish goals.   Keep all upcoming appointment discussed today you have an appointment with pharmacy on June 4th  I am sorry you continue to feel down, I sent a message to Dr. Drue Novel informing him you would like to start a medication to help with your depression          Our next appointment is by telephone on 01/27/23 at 2:45   Please call the care guide team at [phone] if you need to cancel or reschedule your appointment.   If you or anyone you know are experiencing a Mental Health or Behavioral Health Crisis or need someone to talk to, please call the Suicide and Crisis Lifeline: 988 call the [place] National Suicide Prevention Lifeline: 586-152-7298 or TTY: (920)786-8325 TTY 309 873 2765) to talk to a trained counselor call 1-800-273-TALK (toll free, 24 hour hotline)   Patient verbalizes understanding of instructions and care plan provided today and agrees to view in MyChart. Active MyChart status and patient understanding of how to access instructions and care plan via MyChart confirmed with patient.       [name], LCSW Social Work Care Coordination  [name] [phone]

## 2023-01-06 NOTE — Progress Notes (Signed)
The patient was evaluated by Mrs. Moore, LCSW. He is now open to start medication for depression, see message below. Will send Lexapro 10 mg. Noted possible interaction with trazodone, decrease trazodone to 50 mg daily. Patient notified.  JP  Soundra Pilon, LCSW  Wanda Plump, MD hello Dr. Drue Novel I read your note from May 1st ov where [name] declined both medication and counseling.  I just spoke to him today and he is open and willing to start a medication to help with his symptoms of depression. States he is tired of feeling down.  I also discussed therapy but he declined.

## 2023-01-06 NOTE — Patient Outreach (Signed)
  Care Coordination  Follow Up Visit Note   01/06/2023 Name: [name]. MRN: [id number] DOB: Dec 13, 1960  [name]. is a 62 y.o. year old male who sees Drue Novel, Nolon Rod, MD for primary care. I spoke with  [name]. by phone today.  What matters to the patients health and wellness today?  Tire of feeling down  Patient reports he is tired of feeling down.  He is not interested in counseling as he does not want to talk to anyone but would like to try a medication.  Information has been shared with PCP.  Will f/u   Goals Addressed             This Visit's Progress    Manage and reduce symptoms of depression       Activities and task to complete in order to accomplish goals.   Keep all upcoming appointment discussed today you have an appointment with pharmacy on June 4th  I am sorry you continue to feel down, I sent a message to Dr. Drue Novel informing him you would like to start a medication to help with your depression         SDOH assessments and interventions completed:  No   Care Coordination Interventions:  Yes, provided  Interventions Today    Flowsheet Row Most Recent Value  Chronic Disease   Chronic disease during today's visit Hypertension (HTN), Congestive Heart Failure (CHF), Chronic Obstructive Pulmonary Disease (COPD), Diabetes  General Interventions   General Interventions Discussed/Reviewed General Interventions Reviewed, Doctor Visits  Doctor Visits Discussed/Reviewed Doctor Visits Reviewed  [reviewed May 1st ov]  Communication with PCP/Specialists  Leodis Binet. starting medication for depression]  Education Interventions   Education Provided Provided Education  Provided Verbal Education On Mental Health/Coping with Illness  Mental Health Interventions   Mental Health Discussed/Reviewed Mental Health Reviewed, Depression  [solution focused, active listening, & motivational interviewing]  Pharmacy Interventions   Pharmacy Dicussed/Reviewed Pharmacy  Topics Reviewed  [reports no concerns with medications reminded him of call with pharmacy on June 4th]       Follow up plan: Follow up call scheduled for 3 weeks. Will continue to collaborate with PCP    Encounter Outcome:  Pt. Visit Completed   [name], LCSW Social Work Care Coordination  [name] [phone]

## 2023-01-07 ENCOUNTER — Other Ambulatory Visit: Payer: Self-pay | Admitting: Cardiovascular Disease

## 2023-01-12 ENCOUNTER — Telehealth: Payer: Self-pay | Admitting: Pharmacist

## 2023-01-12 ENCOUNTER — Other Ambulatory Visit: Payer: Medicare HMO | Admitting: Pharmacist

## 2023-01-12 NOTE — Telephone Encounter (Signed)
Unsuccessful attempt to reach patient by phone for medication follow up. LM on VM with CB# [phone] or [phone].   Patient did call back about 30 minutes later but I was on the phone with another patient.  Tried to call back again and uable to reach patient - LM on VM with CB#.

## 2023-01-14 ENCOUNTER — Other Ambulatory Visit: Payer: Self-pay | Admitting: Internal Medicine

## 2023-01-26 DIAGNOSIS — M5416 Radiculopathy, lumbar region: Secondary | ICD-10-CM | POA: Diagnosis not present

## 2023-01-27 ENCOUNTER — Ambulatory Visit: Payer: Self-pay | Admitting: Licensed Clinical Social Worker

## 2023-01-27 NOTE — Patient Outreach (Signed)
  Care Coordination  Follow Up Visit Note   01/27/2023 Name: [name]. MRN: [id number] DOB: 1961/01/31  [name]. is a 62 y.o. year old male who sees [name], Kenneth Rod, MD for primary care. I spoke with  [name]. by phone today.  What matters to the patients health and wellness today?  Managing his physical and mental health needs.  Patient is making progress with his mental health and is starting to feel a little better.  Started taking Lexapro about 30 days ago and reports no missed doses.  Will pick up refill this week. Denies any symptoms with medication (SI or HI).  Reports no concerns with sleep at this time.  Updated shared with PCP via secure chat.   Goals Addressed             This Visit's Progress    Manage and reduce symptoms of depression       Activities and task to complete in order to accomplish goals.   Call the pharmacy to follow up on the refill you have for lexapro You have decided not to move forward with counseling and would like to work with me on brief coping skills to assist you with managing symptoms of depression Keep all upcoming appointment discussed today Continue with compliance of taking medication prescribed by Doctor  I am glad you are starting to feel a little better       SDOH assessments and interventions completed:  No   Care Coordination Interventions:  Yes, provided  Interventions Today    Flowsheet Row Most Recent Value  Chronic Disease   Chronic disease during today's visit Hypertension (HTN), Congestive Heart Failure (CHF), Chronic Obstructive Pulmonary Disease (COPD), Diabetes  General Interventions   General Interventions Discussed/Reviewed General Interventions Reviewed  [reviewed upcoming appointments]  Communication with PCP/Specialists  Education Interventions   Provided Verbal Education On Mental Health/Coping with Illness  Mental Health Interventions   Mental Health Discussed/Reviewed Mental Health  Reviewed  [name] listening, and assessment of needs]  Pharmacy Interventions   Pharmacy Dicussed/Reviewed Pharmacy Topics Reviewed, Medication Adherence  [is taking lexapro 10mg  reports no missed doses, only has 3 left reminded him to call in refill.]  Safety Interventions   Safety Discussed/Reviewed Safety Reviewed  [reports no HI or SI with starting Lexapro]       Follow up plan: Follow up call scheduled for 30 days    Encounter Outcome:  Pt. Visit Completed   Kenneth Hines, LCSW Social Work Care Coordination  [name] [employer] [phone]

## 2023-01-27 NOTE — Patient Instructions (Signed)
Social Work Visit Information  Thank you for taking time to visit with me today. Please don't hesitate to contact me if I can be of assistance to you.   Following are the goals we discussed today:   Goals Addressed             This Visit's Progress    Manage and reduce symptoms of depression       Activities and task to complete in order to accomplish goals.   Call the pharmacy to follow up on the refill you have for lexapro You have decided not to move forward with counseling and would like to work with me on brief coping skills to assist you with managing symptoms of depression Keep all upcoming appointment discussed today Continue with compliance of taking medication prescribed by Doctor  I am glad you are starting to feel a little better        Our next appointment is by telephone on 02/24/23 at 3:15   Please call the care guide team at [phone] if you need to cancel or reschedule your appointment.   If you or anyone you know are experiencing a Mental Health or Behavioral Health Crisis or need someone to talk to, please call the Suicide and Crisis Lifeline: 988 call the [place] National Suicide Prevention Lifeline: 9385571470 or TTY: 210-367-6545 TTY 3868722194) to talk to a trained counselor call 1-800-273-TALK (toll free, 24 hour hotline) go to [facility] [address] [place] [phone])   Patient verbalizes understanding of instructions and care plan provided today and agrees to view in MyChart. Active MyChart status and patient understanding of how to access instructions and care plan via MyChart confirmed with patient.      Kenneth Hines, LCSW Social Work Care Coordination  [facility] [name] [phone]

## 2023-02-24 ENCOUNTER — Telehealth: Payer: Self-pay | Admitting: Licensed Clinical Social Worker

## 2023-02-24 ENCOUNTER — Encounter: Payer: Self-pay | Admitting: Licensed Clinical Social Worker

## 2023-02-24 ENCOUNTER — Encounter: Payer: Medicare HMO | Admitting: Licensed Clinical Social Worker

## 2023-02-24 NOTE — Patient Instructions (Signed)
  I am sorry you were unable to keep your phone appointment today.   Please call me to reschedule  Deborah Moore, LCSW Social Work Care Coordination  [phone]  

## 2023-02-24 NOTE — Patient Outreach (Signed)
  Care Coordination   02/24/2023 Name: [name]. MRN: [id number] DOB: 08/24/1960   Care Coordination Outreach Attempts:  An unsuccessful telephone outreach was attempted for a scheduled appointment today.  Follow Up Plan:  Additional outreach attempts will be made to offer the patient care coordination information and services.   Encounter Outcome:  No Answer   Care Coordination Interventions:  No, not indicated    [name], LCSW Social Work Care Coordination  [name] [phone]

## 2023-03-02 DIAGNOSIS — M5451 Vertebrogenic low back pain: Secondary | ICD-10-CM | POA: Diagnosis not present

## 2023-03-11 ENCOUNTER — Telehealth: Payer: Self-pay | Admitting: Internal Medicine

## 2023-03-11 NOTE — Telephone Encounter (Signed)
Pt's sister Neysa Bonito (on DPR)called to discuss some updates that are coming from the back surgeon and what the next steps are and she thought she should start with PCP. Please call her back at [phone].

## 2023-03-12 NOTE — Telephone Encounter (Signed)
Patient is due for office visit, please arrange. I could contact the patient's sister the day of the office visit.  Either before or after I seeCharles

## 2023-03-12 NOTE — Telephone Encounter (Signed)
Spoke w/ Neysa Bonito- she informed that Pt has been seeng Dr. Shelle Iron over at [facility]- he has had an MRI completed that showed a possible AAA and is being referred to vascular surgery, she is worried because they haven't heard from anyone to schedule that. Informed that I can only see some of the notes from 03/02/23 w/ Dr. Shelle Iron, referral was placed at that time, recommended she give them several more days, if she doesn't hear from them to call Dr. Ermelinda Das office. Christy verbalized understanding.

## 2023-03-15 ENCOUNTER — Ambulatory Visit: Payer: Medicare HMO | Admitting: Internal Medicine

## 2023-03-23 ENCOUNTER — Ambulatory Visit: Payer: Self-pay | Admitting: Licensed Clinical Social Worker

## 2023-03-23 NOTE — Patient Instructions (Signed)
Social Work Visit Information  Thank you for taking time to visit with me today. Please don't hesitate to contact me if I can be of assistance to you.   Following are the goals we discussed today:   Goals Addressed             This Visit's Progress    COMPLETED: Manage and reduce symptoms of depression       Activities and task to complete in order to accomplish goals.   You have decided not to move forward with counseling to manage symptoms of depression Keep all upcoming appointment discussed today Continue with compliance of taking medication prescribed by Doctor  Call office to schedule a f/u with PCP  he requested you see him 6 weeks after starting your medication         No follow up scheduled with social work at this time. Patient will call office if needed.  Please call the care guide team at [phone] if you need to cancel or reschedule your appointment.   If you or anyone you know are experiencing a Mental Health or Behavioral Health Crisis or need someone to talk to, please call the Suicide and Crisis Lifeline: 988 call the [place] National Suicide Prevention Lifeline: 724-736-8259 or TTY: 716-717-9236 TTY 618-861-8590) to talk to a trained counselor call 1-800-273-TALK (toll free, 24 hour hotline) go to [facility] [address] [place] [phone])   Patient verbalizes understanding of instructions and care plan provided today and agrees to view in MyChart. Active MyChart status and patient understanding of how to access instructions and care plan via MyChart confirmed with patient.       [name], LCSW Social Work Care Coordination  [name] [phone]

## 2023-03-23 NOTE — Patient Outreach (Signed)
  Care Coordination  Follow Up Visit Note   03/23/2023 Name: [name]. MRN: [id number] DOB: [birth year]  [name]. is a 62 y.o. year old male who sees Drue Novel, Nolon Rod, MD for primary care. I spoke with  [name]. by phone today.  What matters to the patients health and wellness today?    Patient reports he has not noticed any difference in the way he fells related to symptoms of depression.  Continues to take Lexapro. Advised patient to schedule f/u appointment with PCP per after visit summary to schedule f/u in 6 weeks.  Patient continues to decline referral or options for getting connected for ongoing therapy.  No new needs identified and no new goals established.    Goals Addressed             This Visit's Progress    COMPLETED: Manage and reduce symptoms of depression       Activities and task to complete in order to accomplish goals.   You have decided not to move forward with counseling to manage symptoms of depression Keep all upcoming appointment discussed today Continue with compliance of taking medication prescribed by Doctor  Call office to schedule a f/u with PCP  he requested you see him 6 weeks after starting your medication        SDOH assessments and interventions completed:  No   Care Coordination Interventions:  Yes, provided  Interventions Today    Flowsheet Row Most Recent Value  Chronic Disease   Chronic disease during today's visit Hypertension (HTN), Congestive Heart Failure (CHF), Chronic Obstructive Pulmonary Disease (COPD), Diabetes  General Interventions   General Interventions Discussed/Reviewed General Interventions Reviewed, Doctor Visits  Doctor Visits Discussed/Reviewed --  [reminded patient to schedule f/u with PCP]  Mental Health Interventions   Mental Health Discussed/Reviewed Mental Health Reviewed  [declined counseling]  Pharmacy Interventions   Pharmacy Dicussed/Reviewed Pharmacy Topics Reviewed  [reports no  missed does with Lexapro however continues to feel depressed]  Advanced Directive Interventions   Advanced Directives Discussed/Reviewed Advanced Directives Discussed  [patient states has documents, encouraged him to bring them in to have scanned into his chart]       Follow up plan: No further intervention required. Patient will inform provider if additional support is needed.  Encounter Outcome:  Pt. Visit Completed   [name], LCSW Social Work Care Coordination  [name] [phone]

## 2023-04-07 ENCOUNTER — Other Ambulatory Visit: Payer: Self-pay | Admitting: Cardiovascular Disease

## 2023-04-13 ENCOUNTER — Other Ambulatory Visit: Payer: Self-pay | Admitting: *Deleted

## 2023-04-13 DIAGNOSIS — I7143 Infrarenal abdominal aortic aneurysm, without rupture: Secondary | ICD-10-CM

## 2023-04-14 ENCOUNTER — Ambulatory Visit (HOSPITAL_COMMUNITY)
Admission: RE | Admit: 2023-04-14 | Discharge: 2023-04-14 | Disposition: A | Discharge status: Home / Self Care | Payer: Medicare HMO | Source: Ambulatory Visit | Attending: Vascular Surgery | Admitting: Vascular Surgery

## 2023-04-14 DIAGNOSIS — I7143 Infrarenal abdominal aortic aneurysm, without rupture: Secondary | ICD-10-CM | POA: Diagnosis not present

## 2023-04-22 ENCOUNTER — Other Ambulatory Visit (HOSPITAL_COMMUNITY): Payer: Medicare HMO

## 2023-04-26 NOTE — Progress Notes (Unsigned)
[facility] [place]  ASSESSMENT / PLAN: [name]. is a 62 y.o. male with a infrarenal abdominal aortic aneurysm measuring 33mm.  Recommend:  Abstinence from all tobacco products. Blood glucose control with goal A1c < 7%. Blood pressure control with goal blood pressure < 140/90 mmHg. Lipid reduction therapy with goal LDL-C <100 mg/dL.  Aspirin 81mg  PO QD.  Atorvastatin 40-80mg  PO QD (or other "high intensity" statin therapy).  Small risk of rupture. Follow up with me in 2-3 years with AAA duplex.  CHIEF COMPLAINT: Incidental discovery of abdominal aortic aneurysm  HISTORY OF PRESENT ILLNESS: [name]. is a 62 y.o. male referred to clinic for incidental discovery of abdominal aortic aneurysm during workup for back pain.  The patient has no symptoms attributable to the small abdominal aortic aneurysm.  We reviewed his duplex in detail.  We reviewed the natural history of abdominal aortic aneurysms.  We reviewed the rationale for surveillance.  Past Medical History:  Diagnosis Date   (HFpEF) heart failure with preserved ejection fraction (HCC)    Echo 6/22: EF 55-60, no RWMA, GR 3 DD, elevated LVEDP, normal RVSF, RVSP 53.2, mild to moderate AV sclerosis without stenosis, dilated aortic root (41 mm), ascending aorta 37 mm   AAA (abdominal aortic aneurysm) (HCC)    Abnormality of gait    Anxiety    CAD (coronary artery disease)    a. 10/2008 Inferolateral STEMI: 3VD w/ occluded LCX (PTCA)-->CABG x 3 (LIMA->LAD, VG->OM, VG->PDA);  b. 06/2009 NSTEMI/PCI: VG->OM 100 (BMS);  c. 05/2013 native 3VD, 3/3 patent grafts. d. 06/2016: Inferior STEMI w/ occluded SVG-OM (DES placed). LIMA-LAD and SVG-PDA patent.   Chronic bronchitis (HCC)    Chronic diastolic CHF (congestive heart failure) (HCC)    a. 02/2015 Echo: EF 50-55%, distal septal, apical, inferobasal HK, mild LVH, mild MR, mildly dil LA.   Cocaine use    Colon polyps    a. 09/2015 colonscopy:  multiple sessile polyps, path negative for high grade dysplasia.   Depression    Emphysema (subcutaneous) (surgical) resulting from a procedure 10/2008   Bleb resected at time of 10/2008 CABG. Marketed emphysema noted on surgical report.   Esophagitis    a. 2017 EGD: esophagitis, duodenitis.   ETOH abuse    GERD (gastroesophageal reflux disease)    Hemianopia, homonymous, right    "can't see out the right side of either eye since stroke in 2016/pt description 09/23/2016   Hepatic steatosis    Hiatal hernia    History of nuclear stress test 08/2017   Nuclear stress test 1/19: EF 35, inf-lat and ant-lat, apical sept/apical scar; no ischemia; Intermediate Risk   Hyperlipidemia    Hypertension    Iron deficiency anemia 2010   Morbid obesity (HCC)    Myocardial infarction [facility]) "several"   PAD (peripheral artery disease) (HCC)    Pancreatitis 06/2015   Sleep apnea    "don't have a mask" (09/23/2016)   Stroke (HCC) 02/2015   a. 02/2015 Carotid U/S: 1-39% bilat ICA stenosis; "left me blind" (09/23/2016)   Tobacco abuse    still smokes   Tubular adenoma of colon     Past Surgical History:  Procedure Laterality Date   ABDOMINAL AORTOGRAM W/LOWER EXTREMITY N/A 09/23/2016   Procedure: Abdominal Aortogram w/Lower Extremity;  Surgeon: Iran Ouch, MD;  Location: [facility] INVASIVE CV LAB;  Service: Cardiovascular;  Laterality: N/A;   ABDOMINAL AORTOGRAM W/LOWER EXTREMITY N/A 02/18/2022   Procedure: ABDOMINAL AORTOGRAM W/LOWER  EXTREMITY;  Surgeon: Iran Ouch, MD;  Location: [facility] INVASIVE CV LAB;  Service: Cardiovascular;  Laterality: N/A;   CARDIAC CATHETERIZATION  11/07/2008   EF of 50-55% -- normal LV systolic function, acute inferolateral ST elevation MI,  PTCA of the circumflex artery -- Colleen Can. Deborah Chalk, [name]   CARDIAC CATHETERIZATION N/A 07/07/2016   Procedure: Left Heart Cath and Cors/Grafts Angiography;  Surgeon: Peter M Swaziland, MD;  Location: [facility] INVASIVE CV LAB;  Service:  Cardiovascular;  Laterality: N/A;   CARDIAC CATHETERIZATION N/A 07/07/2016   Procedure: Coronary Stent Intervention;  Surgeon: Peter M Swaziland, MD;  Location: [facility] INVASIVE CV LAB;  Service: Cardiovascular;  Laterality: N/A;   CORONARY ANGIOPLASTY     CORONARY ARTERY BYPASS GRAFT     "CABG X4"   LEFT HEART CATH AND CORS/GRAFTS ANGIOGRAPHY N/A 10/01/2017   Procedure: LEFT HEART CATH AND CORS/GRAFTS ANGIOGRAPHY;  Surgeon: Kathleene Hazel, MD;  Location: [facility] INVASIVE CV LAB;  Service: Cardiovascular;  Laterality: N/A;   LEFT HEART CATHETERIZATION WITH CORONARY/GRAFT ANGIOGRAM N/A 05/15/2013   Procedure: LEFT HEART CATHETERIZATION WITH Isabel Caprice;  Surgeon: Peter M Swaziland, MD;  Location: [facility] CATH LAB;  Service: Cardiovascular;  Laterality: N/A;   PERIPHERAL VASCULAR ATHERECTOMY  02/18/2022   Procedure: PERIPHERAL VASCULAR ATHERECTOMY;  Surgeon: Iran Ouch, MD;  Location: [facility] INVASIVE CV LAB;  Service: Cardiovascular;;   PERIPHERAL VASCULAR INTERVENTION  09/23/2016   Procedure: Peripheral Vascular Intervention;  Surgeon: Iran Ouch, MD;  Location: [facility] INVASIVE CV LAB;  Service: Cardiovascular;;  Lt. SFA    Family History  Problem Relation Age of Onset   Heart disease Mother    Heart failure Mother    Diabetes Mother    Colon cancer Father        late 4s   Prostate cancer Neg Hx     Social History   Socioeconomic History   Marital status: Single    Spouse name: Not on file   Number of children: 2   Years of education: 41   Highest education level: High school graduate  Occupational History   Occupation: not working , used to be a Haematologist    Occupation: disable  Tobacco Use   Smoking status: Every Day    Current packs/day: 1.50    Average packs/day: 1.5 packs/day for 48.7 years (73.1 ttl pk-yrs)    Types: Cigarettes    Start date: 1976    Passive exposure: Current   Smokeless tobacco: Never   Tobacco comments:    Smoking 10 per day. No longer  smoking marijuana.  04/07/2022 hfb  Vaping Use   Vaping status: Never Used  Substance and Sexual Activity   Alcohol use: Yes    Alcohol/week: 3.0 standard drinks of alcohol    Types: 3 Cans of beer per week    Comment: 3 beers per day   Drug use: Yes    Types: Marijuana    Comment: quit 5-10 years ago   Sexual activity: Not Currently  Other Topics Concern   Not on file  Social History Narrative   Lives by himself   Has a car    Sister Neysa Bonito,  lives in [place], she is willing to help her brother, she will be Film/video editor messages and coordinating his appointments.  She can be reached at [phone]".   Social Determinants of Health   Financial Resource Strain: Low Risk  (02/04/2022)   Overall Financial Resource Strain (CARDIA)    Difficulty of  Paying Living Expenses: Not hard at all  Food Insecurity: No Food Insecurity (11/10/2022)   Hunger Vital Sign    Worried About Running Out of Food in the Last Year: Never true    Ran Out of Food in the Last Year: Never true  Transportation Needs: No Transportation Needs (11/10/2022)   PRAPARE - Administrator, Civil Service (Medical): No    Lack of Transportation (Non-Medical): No  Physical Activity: Inactive (02/04/2022)   Exercise Vital Sign    Days of Exercise per Week: 0 days    Minutes of Exercise per Session: 0 min  Stress: Stress Concern Present (11/10/2022)   [employer] of Occupational Health - Occupational Stress Questionnaire    Feeling of Stress : To some extent  Social Connections: Moderately Isolated (03/17/2022)   Social Connection and Isolation Panel [NHANES]    Frequency of Communication with Friends and Family: More than three times a week    Frequency of Social Gatherings with Friends and Family: Three times a week    Attends Religious Services: More than 4 times per year    Active Member of Clubs or Organizations: No    Attends Banker Meetings: Never    Marital Status: Divorced   Catering manager Violence: Not At Risk (02/04/2022)   Humiliation, Afraid, Rape, and Kick questionnaire    Fear of Current or Ex-Partner: No    Emotionally Abused: No    Physically Abused: No    Sexually Abused: No    Allergies  Allergen Reactions   Metformin And Related Diarrhea and Other (See Comments)    Severe diarrhea   Wellbutrin [Bupropion] Nausea Only    Current Outpatient Medications  Medication Sig Dispense Refill   albuterol (VENTOLIN HFA) 108 (90 Base) MCG/ACT inhaler Inhale 2 puffs into the lungs every 6 (six) hours as needed for wheezing or shortness of breath. 8 g 2   aspirin EC 81 MG EC tablet Take 1 tablet (81 mg total) by mouth daily with breakfast. 30 tablet 2   atorvastatin (LIPITOR) 80 MG tablet Take 1 tablet (80 mg total) by mouth daily. 90 tablet 3   budesonide-formoterol (SYMBICORT) 160-4.5 MCG/ACT inhaler Inhale 2 puffs into the lungs 2 (two) times daily. 30 g 1   Cholecalciferol (VITAMIN D-3) 125 MCG (5000 UT) TABS Take 5,000 Units by mouth daily.     clopidogrel (PLAVIX) 75 MG tablet TAKE 1 TABLET EVERY DAY 90 tablet 3   Cyanocobalamin (VITAMIN B-12) 5000 MCG SUBL Place 5,000 mcg under the tongue daily.     dapagliflozin propanediol (FARXIGA) 5 MG TABS tablet Take 1 tablet (5 mg total) by mouth daily before breakfast. 90 tablet 3   escitalopram (LEXAPRO) 10 MG tablet Take 1 tablet (10 mg total) by mouth daily. 30 tablet 3   ezetimibe (ZETIA) 10 MG tablet Take 1 tablet (10 mg total) by mouth daily. Please keep scheduled appointment for future refills. Thank you. 60 tablet 0   fenofibrate (TRICOR) 145 MG tablet Take 1 tablet (145 mg total) by mouth daily. 90 tablet 1   folic acid (FOLVITE) 1 MG tablet Take 1 tablet (1 mg total) by mouth daily. 90 tablet 3   guaiFENesin (MUCINEX) 600 MG 12 hr tablet Take 600 mg by mouth 2 (two) times daily as needed for cough.     isosorbide mononitrate (IMDUR) 60 MG 24 hr tablet TAKE 1 AND 1/2 TABLETS EVERY DAY 135 tablet 3    oxyCODONE-acetaminophen (PERCOCET) 10-325 MG tablet  Take 1 tablet by mouth every 6 (six) hours as needed for pain.     OXYGEN Inhale 2-4 L/min into the lungs as needed (for shortness of breath).     pantoprazole (PROTONIX) 40 MG tablet Take 1 tablet (40 mg total) by mouth 2 (two) times daily. Please keep scheduled appointment for future refills. Thank you. 120 tablet 0   PRESCRIPTION MEDICATION CPAP- At bedtime     spironolactone (ALDACTONE) 25 MG tablet TAKE 1/2 TABLET EVERY DAY 45 tablet 3   tamsulosin (FLOMAX) 0.4 MG CAPS capsule Take 1 capsule (0.4 mg total) by mouth in the morning and at bedtime. 180 capsule 1   thiamine 100 MG tablet Take 1 tablet (100 mg total) by mouth daily. 90 tablet 3   traZODone (DESYREL) 100 MG tablet Take 0.5 tablets (50 mg total) by mouth at bedtime.  0   valsartan (DIOVAN) 40 MG tablet Take 1 tablet (40 mg total) by mouth daily. 90 tablet 1   No current facility-administered medications for this visit.    PHYSICAL EXAM Vitals:   04/27/23 1427  BP: (!) 141/79  Pulse: 71  Resp: 20  Temp: 98.3 F (36.8 C)  SpO2: 94%  Weight: 215 lb (97.5 kg)  Height: 5\' 11"  (1.803 m)    Chronically ill-appearing man in no distress Regular rate and rhythm Unlabored breathing Soft, nontender abdomen   PERTINENT LABORATORY AND RADIOLOGIC DATA  Most recent CBC    Latest Ref Rng & Units 12/09/2022    3:12 PM 02/05/2022    2:47 PM 12/24/2021    2:04 AM  CBC  WBC 4.0 - 10.5 K/uL 6.6  7.8  9.0   Hemoglobin 13.0 - 17.0 g/dL 40.9  81.1  91.4   Hematocrit 39.0 - 52.0 % 46.2  45.2  45.2   Platelets 150.0 - 400.0 K/uL 162.0  206  195      Most recent CMP    Latest Ref Rng & Units 12/09/2022    3:12 PM 04/29/2022    3:15 PM 02/05/2022    2:47 PM  CMP  Glucose 70 - 99 mg/dL 782  80  84   BUN 6 - 23 mg/dL 12  18  13    Creatinine 0.40 - 1.50 mg/dL 9.56  2.13  0.86   Sodium 135 - 145 mEq/L 136  137  140   Potassium 3.5 - 5.1 mEq/L 4.1  4.2  4.7   Chloride 96 - 112 mEq/L  102  99  100   CO2 19 - 32 mEq/L 26  29  25    Calcium 8.4 - 10.5 mg/dL 9.2  9.3  9.6   AST 0 - 37 U/L  12    ALT 0 - 53 U/L  10      Renal function CrCl cannot be calculated (Patient's most recent lab result is older than the maximum 21 days allowed.).  Hgb A1c MFr Bld (%)  Date Value  12/09/2022 6.7 (H)    LDL Chol Calc (NIH)  Date Value Ref Range Status  11/14/2019 116 (H) 0 - 99 mg/dL Final   LDL Cholesterol  Date Value Ref Range Status  04/29/2022 60 0 - 99 mg/dL Final   Direct LDL  Date Value Ref Range Status  05/12/2013 116.0 mg/dL Final    Comment:    Optimal:  <100 mg/dLNear or Above Optimal:  100-129 mg/dLBorderline High:  130-159 mg/dLHigh:  160-189 mg/dLVery High:  >190 mg/dL     Vascular Imaging: 33mm  AAA identified on duplex  Rande Brunt. Lenell Antu, MD FACS [facility] Phone Number: [phone] 04/27/2023 3:49 PM   Total time spent on preparing this encounter including chart review, data review, collecting history, examining the patient, coordinating care for this new patient, 60 minutes.  Portions of this report may have been transcribed using voice recognition software.  Every effort has been made to ensure accuracy; however, inadvertent computerized transcription errors may still be present.

## 2023-04-27 ENCOUNTER — Encounter: Payer: Self-pay | Admitting: Vascular Surgery

## 2023-04-27 ENCOUNTER — Ambulatory Visit (INDEPENDENT_AMBULATORY_CARE_PROVIDER_SITE_OTHER): Payer: Medicare HMO | Admitting: Vascular Surgery

## 2023-04-27 VITALS — BP 141/79 | HR 71 | Temp 98.3°F | Resp 20 | Ht 71.0 in | Wt 215.0 lb

## 2023-04-27 DIAGNOSIS — M5451 Vertebrogenic low back pain: Secondary | ICD-10-CM | POA: Diagnosis not present

## 2023-04-27 DIAGNOSIS — I739 Peripheral vascular disease, unspecified: Secondary | ICD-10-CM | POA: Diagnosis not present

## 2023-04-27 DIAGNOSIS — F17200 Nicotine dependence, unspecified, uncomplicated: Secondary | ICD-10-CM | POA: Diagnosis not present

## 2023-04-27 DIAGNOSIS — I7143 Infrarenal abdominal aortic aneurysm, without rupture: Secondary | ICD-10-CM | POA: Diagnosis not present

## 2023-04-27 DIAGNOSIS — M5416 Radiculopathy, lumbar region: Secondary | ICD-10-CM | POA: Diagnosis not present

## 2023-04-28 ENCOUNTER — Telehealth: Payer: Self-pay

## 2023-04-28 ENCOUNTER — Other Ambulatory Visit: Payer: Self-pay | Admitting: Internal Medicine

## 2023-04-28 NOTE — Patient Outreach (Signed)
Care Management   Outreach Note  04/28/2023 Name: [name]. MRN: [id number] DOB: 09-Feb-1961  An unsuccessful telephone outreach was attempted today to contact the patient about Care Management needs.      Follow Up Plan:  A HIPAA compliant phone message was left for the patient providing contact information and requesting a return call.      [facility]  [facility] RN Care Manager Direct Dial: [phone] Website: [email or web address]

## 2023-05-03 ENCOUNTER — Encounter: Payer: Self-pay | Admitting: Pharmacist

## 2023-05-03 ENCOUNTER — Other Ambulatory Visit: Payer: Self-pay | Admitting: Pharmacist

## 2023-05-03 MED ORDER — DAPAGLIFLOZIN PROPANEDIOL 5 MG PO TABS: 5.0000 mg | ORAL_TABLET | Freq: Every day | ORAL | 3 refills | Status: DC

## 2023-05-03 NOTE — Progress Notes (Signed)
05/03/2023 Name: [name]. MRN: [id number] DOB: [birth year]  Chief Complaint  Patient presents with   Medication Refill    [name]    Subjective: Received message from [facility] and Me that patient needs updated prescription sent to [facility] for medication assistance program.  However the notes for an upcoming appointment mention that patient has stopped taking Farxiga, escitalopram / Lexarpro, albuterol and spironolactone. This appointment was made thru mychart so I am assuming that these notes were place in appointment notes by either patient or his sister who usually monitors his MyChart account.   Has appointment with PCP, Dr Drue Novel 06/01/2023 and will see cardiology on 05/14/2023  Objective:  Lab Results  Component Value Date   HGBA1C 6.7 (H) 12/09/2022    Lab Results  Component Value Date   CREATININE 1.09 12/09/2022   BUN 12 12/09/2022   NA 136 12/09/2022   K 4.1 12/09/2022   CL 102 12/09/2022   CO2 26 12/09/2022    Lab Results  Component Value Date   CHOL 127 04/29/2022   HDL 47.20 04/29/2022   LDLCALC 60 04/29/2022   LDLDIRECT 116.0 05/12/2013   TRIG 101.0 04/29/2022   CHOLHDL 3 04/29/2022    Medications Reviewed Today     Reviewed by Henrene Pastor, RPH-CPP (Pharmacist) on 05/03/23 at 1451  Med List Status: <None>   Medication Order Taking? Sig Documenting Provider Last Dose Status Informant  albuterol (VENTOLIN HFA) 108 (90 Base) MCG/ACT inhaler [id number] Yes Inhale 2 puffs into the lungs every 6 (six) hours as needed for wheezing or shortness of breath. Gaston Islam., NP Taking Active   aspirin EC 81 MG EC tablet [id number]  Take 1 tablet (81 mg total) by mouth daily with breakfast. Shon Hale, MD  Active Multiple Informants  atorvastatin (LIPITOR) 80 MG tablet [id number]  Take 1 tablet (80 mg total) by mouth daily. Kathleene Hazel, MD  Active   budesonide-formoterol [facility]) 160-4.5 MCG/ACT inhaler [id number] Yes Inhale 2 puffs into  the lungs 2 (two) times daily. Wanda Plump, MD Taking Active Multiple Informants  Cholecalciferol (VITAMIN D-3) 125 MCG (5000 UT) TABS [id number]  Take 5,000 Units by mouth daily. [provider]  Active Multiple Informants  clopidogrel (PLAVIX) 75 MG tablet [id number] Yes TAKE 1 TABLET EVERY DAY Kathleene Hazel, MD Taking Active   Cyanocobalamin (VITAMIN B-12) 5000 MCG SUBL [id number]  Place 5,000 mcg under the tongue daily. [provider]  Active Multiple Informants           Med Note (CANTER, Vicente Males D   Wed Dec 09, 2022  2:22 PM) Sometimes  dapagliflozin propanediol (FARXIGA) 5 MG TABS tablet [id number] Yes Take 1 tablet (5 mg total) by mouth daily before breakfast. Wanda Plump, MD Taking Active   escitalopram (LEXAPRO) 10 MG tablet [id number] No Take 1 tablet (10 mg total) by mouth daily.  Patient not taking: Reported on 05/03/2023   Wanda Plump, MD Not Taking Active   ezetimibe (ZETIA) 10 MG tablet [id number] Yes Take 1 tablet (10 mg total) by mouth daily. Please keep scheduled appointment for future refills. Thank you. Kathleene Hazel, MD Taking Active   fenofibrate (TRICOR) 145 MG tablet [id number] Yes Take 1 tablet (145 mg total) by mouth daily. Wanda Plump, MD Taking Active   folic acid (FOLVITE) 1 MG tablet [id number]  Take 1 tablet (1 mg total) by mouth daily. Shon Hale, MD  Active Multiple Informants  guaiFENesin (MUCINEX) 600 MG  12 hr tablet [id number] Yes Take 600 mg by mouth 2 (two) times daily as needed for cough. [provider] Taking Active            Med Note (CANTER, Vicente Males D   Wed Dec 09, 2022  2:23 PM) PRN  isosorbide mononitrate (IMDUR) 60 MG 24 hr tablet [id number] Yes TAKE 1 AND 1/2 TABLETS EVERY DAY Kathleene Hazel, MD Taking Active   oxyCODONE-acetaminophen (PERCOCET) 10-325 MG tablet [id number] Yes Take 1 tablet by mouth every 6 (six) hours as needed for pain. [provider] Taking Active   OXYGEN [id number] Yes  Inhale 2-4 L/min into the lungs as needed (for shortness of breath). [provider] Taking Active Multiple Informants           Med Note (CANTER, KAYLYN D   Wed Apr 29, 2022  2:33 PM) PRN  pantoprazole (PROTONIX) 40 MG tablet [id number] Yes Take 1 tablet (40 mg total) by mouth 2 (two) times daily. Please keep scheduled appointment for future refills. Thank you. Kathleene Hazel, MD Taking Active   PRESCRIPTION MEDICATION [id number] Yes CPAP- At bedtime [provider] Taking Active Multiple Informants  spironolactone (ALDACTONE) 25 MG tablet [id number] No TAKE 1/2 TABLET EVERY DAY  Patient not taking: Reported on 05/03/2023   Kathleene Hazel, MD Not Taking Consider Medication Status and Discontinue   tamsulosin (FLOMAX) 0.4 MG CAPS capsule [id number] Yes Take 1 capsule (0.4 mg total) by mouth in the morning and at bedtime. Wanda Plump, MD Taking Active   thiamine 100 MG tablet [id number] Yes Take 1 tablet (100 mg total) by mouth daily. Shon Hale, MD Taking Active Multiple Informants  traZODone (DESYREL) 100 MG tablet [id number] Yes Take 0.5 tablets (50 mg total) by mouth at bedtime. Wanda Plump, MD Taking Active   valsartan (DIOVAN) 40 MG tablet [id number] Yes Take 1 tablet (40 mg total) by mouth daily. Wanda Plump, MD Taking Active               Assessment/Plan:   Medication Management:  Spoke with patient's sister. She endorsed that he is still taking [place]. He has stopped taking escitalopram and spironolactone. She reported he has also not taken albuterol inhaler but we discussed that this is a rescue inhaler and he should keep one on hand to use if needed for shortness of breath, wheezing.  Continue Symbicort 2 puffs twice a day.   CHF - no recent exacerbations Sent in updated Rx for Farxiga - fpaperwork to be scanned to chart.   Follow Up Plan: plan to check in with patient by phone in October (last patient outreach was unsuccessful)  Henrene Pastor, PharmD Clinical Pharmacist [facility]

## 2023-05-11 ENCOUNTER — Other Ambulatory Visit: Payer: Self-pay

## 2023-05-11 NOTE — Patient Instructions (Signed)
Thank you for allowing the Care Management team to participate in your care.  It was great speaking with you today!

## 2023-05-11 NOTE — Patient Outreach (Signed)
Care Management   Visit Note  05/11/2023 Name: [name]. MRN: [id number] DOB: 08-21-1960  Subjective: [name]. is a 62 y.o. year old male who is a primary care patient of Wanda Plump, MD. The Care Management team was consulted for assistance.      Engaged with patient by telephone.  Assessment:  Outpatient Encounter Medications as of 05/11/2023  Medication Sig Note   albuterol (VENTOLIN HFA) 108 (90 Base) MCG/ACT inhaler Inhale 2 puffs into the lungs every 6 (six) hours as needed for wheezing or shortness of breath.    aspirin EC 81 MG EC tablet Take 1 tablet (81 mg total) by mouth daily with breakfast.    atorvastatin (LIPITOR) 80 MG tablet Take 1 tablet (80 mg total) by mouth daily.    Cholecalciferol (VITAMIN D-3) 125 MCG (5000 UT) TABS Take 5,000 Units by mouth daily.    clopidogrel (PLAVIX) 75 MG tablet TAKE 1 TABLET EVERY DAY    dapagliflozin propanediol (FARXIGA) 5 MG TABS tablet Take 1 tablet (5 mg total) by mouth daily before breakfast.    ezetimibe (ZETIA) 10 MG tablet Take 1 tablet (10 mg total) by mouth daily. Please keep scheduled appointment for future refills. Thank you.    guaiFENesin (MUCINEX) 600 MG 12 hr tablet Take 600 mg by mouth 2 (two) times daily as needed for cough. 12/09/2022: PRN   pantoprazole (PROTONIX) 40 MG tablet Take 1 tablet (40 mg total) by mouth 2 (two) times daily. Please keep scheduled appointment for future refills. Thank you.    traZODone (DESYREL) 100 MG tablet Take 0.5 tablets (50 mg total) by mouth at bedtime. 05/11/2023: Reports currently taking two pills   valsartan (DIOVAN) 40 MG tablet Take 1 tablet (40 mg total) by mouth daily.    budesonide-formoterol (SYMBICORT) 160-4.5 MCG/ACT inhaler Inhale 2 puffs into the lungs 2 (two) times daily.    Cyanocobalamin (VITAMIN B-12) 5000 MCG SUBL Place 5,000 mcg under the tongue daily. 12/09/2022: Sometimes   escitalopram (LEXAPRO) 10 MG tablet Take 1 tablet (10 mg total) by mouth daily.  (Patient not taking: Reported on 05/03/2023) 05/11/2023: Reports not taking due to being out of medication   fenofibrate (TRICOR) 145 MG tablet Take 1 tablet (145 mg total) by mouth daily.    folic acid (FOLVITE) 1 MG tablet Take 1 tablet (1 mg total) by mouth daily.    isosorbide mononitrate (IMDUR) 60 MG 24 hr tablet TAKE 1 AND 1/2 TABLETS EVERY DAY    oxyCODONE-acetaminophen (PERCOCET) 10-325 MG tablet Take 1 tablet by mouth every 6 (six) hours as needed for pain.    OXYGEN Inhale 2-4 L/min into the lungs as needed (for shortness of breath). 05/11/2023: Continues to use as needed   PRESCRIPTION MEDICATION CPAP- At bedtime    spironolactone (ALDACTONE) 25 MG tablet TAKE 1/2 TABLET EVERY DAY (Patient not taking: Reported on 05/11/2023)    tamsulosin (FLOMAX) 0.4 MG CAPS capsule Take 1 capsule (0.4 mg total) by mouth in the morning and at bedtime.    thiamine 100 MG tablet Take 1 tablet (100 mg total) by mouth daily.    No facility-administered encounter medications on file as of 05/11/2023.

## 2023-05-14 ENCOUNTER — Ambulatory Visit: Payer: Medicare HMO | Attending: Cardiovascular Disease | Admitting: Cardiovascular Disease

## 2023-05-14 ENCOUNTER — Encounter: Payer: Self-pay | Admitting: Cardiovascular Disease

## 2023-05-14 VITALS — BP 126/60 | HR 57 | Ht 71.0 in | Wt 211.4 lb

## 2023-05-14 DIAGNOSIS — I739 Peripheral vascular disease, unspecified: Secondary | ICD-10-CM

## 2023-05-14 DIAGNOSIS — I2510 Atherosclerotic heart disease of native coronary artery without angina pectoris: Secondary | ICD-10-CM | POA: Diagnosis not present

## 2023-05-14 DIAGNOSIS — Z720 Tobacco use: Secondary | ICD-10-CM

## 2023-05-14 DIAGNOSIS — I10 Essential (primary) hypertension: Secondary | ICD-10-CM

## 2023-05-14 DIAGNOSIS — I5032 Chronic diastolic (congestive) heart failure: Secondary | ICD-10-CM | POA: Diagnosis not present

## 2023-05-14 DIAGNOSIS — I7143 Infrarenal abdominal aortic aneurysm, without rupture: Secondary | ICD-10-CM

## 2023-05-14 DIAGNOSIS — E782 Mixed hyperlipidemia: Secondary | ICD-10-CM | POA: Diagnosis not present

## 2023-05-14 NOTE — Patient Instructions (Signed)
Medication Instructions:  No changes *If you need a refill on your cardiac medications before your next appointment, please call your pharmacy*   Lab Work: none   Testing/Procedures: ECHO ORDER ALREADY IN EPIC FROM Robin Searing, NP Your physician has requested that you have an echocardiogram. Echocardiography is a painless test that uses sound waves to create images of your heart. It provides your doctor with information about the size and shape of your heart and how well your heart's chambers and valves are working. This procedure takes approximately one hour. There are no restrictions for this procedure. Please do NOT wear cologne, perfume, aftershave, or lotions (deodorant is allowed). Please arrive 15 minutes prior to your appointment time.   Follow-Up: At [facility], you and your health needs are our priority.  As part of our continuing mission to provide you with exceptional heart care, we have created designated Provider Care Teams.  These Care Teams include your primary Cardiologist (physician) and Advanced Practice Providers (APPs -  Physician Assistants and Nurse Practitioners) who all work together to provide you with the care you need, when you need it.   Your next appointment:   12 month(s)  Provider:   Verne Carrow, MD

## 2023-05-14 NOTE — Progress Notes (Signed)
Chief Complaint  Patient presents with   Follow-up    CAD   History of Present Illness:  62 yo male with history of HTN, hyperlipidemia, tobacco abuse, former cocaine abuse, PAD and CAD here today for cardiac follow up. He presented with an inferolateral STEMI in March 2010 at which time his Circumflex was occluded and was opened with a balloon. He underwent emergent 3V CABG (LIMA to LAD, SVG to Circumflex, SVG to PDA) given his diffuse multivessel disease. In November 2010 he presented with a NSTEMI and had bare metal stenting of the totally occluded saphenous vein graft to the obtuse marginal artery.  He had a a lateral STEMI in November 2017 secondary to acute occlusion of the SVG to the OM. A Synergy DES was placed in the body of the SVG to the OM. Cardiac cath February 2019 with occlusion of all native vessels with 3 patent bypass grafts. Nuclear stress test in February 2024 with reduced LV function but no clear ischemia. He was supposed to follow up for an echo but did not. He had volume overload in May 2022 and was started on Lasix. He was admitted to [facility] May 2023 with acute CHF and was diuresed with IV Lasix. Echo May 2023 with normal LV function.He was admitted July 2016 to [facility] with CVA and had a recurrent CVA in 2020. He has PAD and is followed by Dr. Kirke Corin. Lower ext angiogram 09/23/16 with occluded left SFA which was treated with atherectomy, drug coated balloon angioplasty and stenting. Moderate disease in right SFA.  He was seen in 2023 in [facility] clinic with severe right leg claudication and had subsequent atherectomy and drug coated balloon angioplasty of the right SFA. 3.3 cm AAA followed in [facility] by Dr. Lenell Antu.   He is here today for follow up. The patient denies any chest pain, dyspnea, palpitations, lower extremity edema, orthopnea, PND, dizziness, near syncope or syncope. He stopped taking his Lasix daily. He may need to have back surgery soon.   Primary Care Physician: Wanda Plump,  MD  Past Medical History:  Diagnosis Date   (HFpEF) heart failure with preserved ejection fraction (HCC)    Echo 6/22: EF 55-60, no RWMA, GR 3 DD, elevated LVEDP, normal RVSF, RVSP 53.2, mild to moderate AV sclerosis without stenosis, dilated aortic root (41 mm), ascending aorta 37 mm   AAA (abdominal aortic aneurysm) (HCC)    Abnormality of gait    Anxiety    CAD (coronary artery disease)    a. 10/2008 Inferolateral STEMI: 3VD w/ occluded LCX (PTCA)-->CABG x 3 (LIMA->LAD, VG->OM, VG->PDA);  b. 06/2009 NSTEMI/PCI: VG->OM 100 (BMS);  c. 05/2013 native 3VD, 3/3 patent grafts. d. 06/2016: Inferior STEMI w/ occluded SVG-OM (DES placed). LIMA-LAD and SVG-PDA patent.   Chronic bronchitis (HCC)    Chronic diastolic CHF (congestive heart failure) (HCC)    a. 02/2015 Echo: EF 50-55%, distal septal, apical, inferobasal HK, mild LVH, mild MR, mildly dil LA.   Cocaine use    Colon polyps    a. 09/2015 colonscopy: multiple sessile polyps, path negative for high grade dysplasia.   Depression    Emphysema (subcutaneous) (surgical) resulting from a procedure 10/2008   Bleb resected at time of 10/2008 CABG. Marketed emphysema noted on surgical report.   Esophagitis    a. 2017 EGD: esophagitis, duodenitis.   ETOH abuse    GERD (gastroesophageal reflux disease)    Hemianopia, homonymous, right    "can't see out the right side of  either eye since stroke in 2016/pt description 09/23/2016   Hepatic steatosis    Hiatal hernia    History of nuclear stress test 08/2017   Nuclear stress test 1/19: EF 35, inf-lat and ant-lat, apical sept/apical scar; no ischemia; Intermediate Risk   Hyperlipidemia    Hypertension    Iron deficiency anemia 2010   Morbid obesity (HCC)    Myocardial infarction [facility]) "several"   PAD (peripheral artery disease) (HCC)    Pancreatitis 06/2015   Sleep apnea    "don't have a mask" (09/23/2016)   Stroke (HCC) 02/2015   a. 02/2015 Carotid U/S: 1-39% bilat ICA stenosis; "left me blind"  (09/23/2016)   Tobacco abuse    still smokes   Tubular adenoma of colon     Past Surgical History:  Procedure Laterality Date   ABDOMINAL AORTOGRAM W/LOWER EXTREMITY N/A 09/23/2016   Procedure: Abdominal Aortogram w/Lower Extremity;  Surgeon: Iran Ouch, MD;  Location: [facility] INVASIVE CV LAB;  Service: Cardiovascular;  Laterality: N/A;   ABDOMINAL AORTOGRAM W/LOWER EXTREMITY N/A 02/18/2022   Procedure: ABDOMINAL AORTOGRAM W/LOWER EXTREMITY;  Surgeon: Iran Ouch, MD;  Location: [facility] INVASIVE CV LAB;  Service: Cardiovascular;  Laterality: N/A;   CARDIAC CATHETERIZATION  11/07/2008   EF of 50-55% -- normal LV systolic function, acute inferolateral ST elevation MI,  PTCA of the circumflex artery -- Colleen Can. Deborah Chalk, [name]   CARDIAC CATHETERIZATION N/A 07/07/2016   Procedure: Left Heart Cath and Cors/Grafts Angiography;  Surgeon: Peter M Swaziland, MD;  Location: [facility] INVASIVE CV LAB;  Service: Cardiovascular;  Laterality: N/A;   CARDIAC CATHETERIZATION N/A 07/07/2016   Procedure: Coronary Stent Intervention;  Surgeon: Peter M Swaziland, MD;  Location: [facility] INVASIVE CV LAB;  Service: Cardiovascular;  Laterality: N/A;   CORONARY ANGIOPLASTY     CORONARY ARTERY BYPASS GRAFT     "CABG X4"   LEFT HEART CATH AND CORS/GRAFTS ANGIOGRAPHY N/A 10/01/2017   Procedure: LEFT HEART CATH AND CORS/GRAFTS ANGIOGRAPHY;  Surgeon: Kathleene Hazel, MD;  Location: [facility] INVASIVE CV LAB;  Service: Cardiovascular;  Laterality: N/A;   LEFT HEART CATHETERIZATION WITH CORONARY/GRAFT ANGIOGRAM N/A 05/15/2013   Procedure: LEFT HEART CATHETERIZATION WITH Isabel Caprice;  Surgeon: Peter M Swaziland, MD;  Location: [facility] CATH LAB;  Service: Cardiovascular;  Laterality: N/A;   PERIPHERAL VASCULAR ATHERECTOMY  02/18/2022   Procedure: PERIPHERAL VASCULAR ATHERECTOMY;  Surgeon: Iran Ouch, MD;  Location: [facility] INVASIVE CV LAB;  Service: Cardiovascular;;   PERIPHERAL VASCULAR INTERVENTION  09/23/2016   Procedure: Peripheral  Vascular Intervention;  Surgeon: Iran Ouch, MD;  Location: [facility] INVASIVE CV LAB;  Service: Cardiovascular;;  Lt. SFA    Current Outpatient Medications  Medication Sig Dispense Refill   albuterol (VENTOLIN HFA) 108 (90 Base) MCG/ACT inhaler Inhale 2 puffs into the lungs every 6 (six) hours as needed for wheezing or shortness of breath. 8 g 2   aspirin EC 81 MG EC tablet Take 1 tablet (81 mg total) by mouth daily with breakfast. 30 tablet 2   atorvastatin (LIPITOR) 80 MG tablet Take 1 tablet (80 mg total) by mouth daily. 90 tablet 3   budesonide-formoterol (SYMBICORT) 160-4.5 MCG/ACT inhaler Inhale 2 puffs into the lungs 2 (two) times daily. 30 g 1   Cholecalciferol (VITAMIN D-3) 125 MCG (5000 UT) TABS Take 5,000 Units by mouth daily.     clopidogrel (PLAVIX) 75 MG tablet TAKE 1 TABLET EVERY DAY 90 tablet 3   Cyanocobalamin (VITAMIN B-12) 5000 MCG SUBL Place 5,000 mcg under  the tongue daily.     dapagliflozin propanediol (FARXIGA) 5 MG TABS tablet Take 1 tablet (5 mg total) by mouth daily before breakfast. 90 tablet 3   escitalopram (LEXAPRO) 10 MG tablet Take 1 tablet (10 mg total) by mouth daily. 30 tablet 3   ezetimibe (ZETIA) 10 MG tablet Take 1 tablet (10 mg total) by mouth daily. Please keep scheduled appointment for future refills. Thank you. 60 tablet 0   fenofibrate (TRICOR) 145 MG tablet Take 1 tablet (145 mg total) by mouth daily. 90 tablet 0   folic acid (FOLVITE) 1 MG tablet Take 1 tablet (1 mg total) by mouth daily. 90 tablet 3   guaiFENesin (MUCINEX) 600 MG 12 hr tablet Take 600 mg by mouth 2 (two) times daily as needed for cough.     isosorbide mononitrate (IMDUR) 60 MG 24 hr tablet TAKE 1 AND 1/2 TABLETS EVERY DAY 135 tablet 3   oxyCODONE-acetaminophen (PERCOCET) 10-325 MG tablet Take 1 tablet by mouth every 6 (six) hours as needed for pain.     OXYGEN Inhale 2-4 L/min into the lungs as needed (for shortness of breath).     pantoprazole (PROTONIX) 40 MG tablet Take 1 tablet  (40 mg total) by mouth 2 (two) times daily. Please keep scheduled appointment for future refills. Thank you. 120 tablet 0   PRESCRIPTION MEDICATION CPAP- At bedtime     spironolactone (ALDACTONE) 25 MG tablet TAKE 1/2 TABLET EVERY DAY 45 tablet 3   tamsulosin (FLOMAX) 0.4 MG CAPS capsule Take 1 capsule (0.4 mg total) by mouth in the morning and at bedtime. 180 capsule 1   thiamine 100 MG tablet Take 1 tablet (100 mg total) by mouth daily. 90 tablet 3   traZODone (DESYREL) 100 MG tablet Take 0.5 tablets (50 mg total) by mouth at bedtime.  0   valsartan (DIOVAN) 40 MG tablet Take 1 tablet (40 mg total) by mouth daily. 90 tablet 1   No current facility-administered medications for this visit.    Allergies  Allergen Reactions   Metformin And Related Diarrhea and Other (See Comments)    Severe diarrhea   Wellbutrin [Bupropion] Nausea Only    Social History   Socioeconomic History   Marital status: Single    Spouse name: Not on file   Number of children: 2   Years of education: 12   Highest education level: High school graduate  Occupational History   Occupation: not working , used to be a Haematologist    Occupation: disable  Tobacco Use   Smoking status: Every Day    Current packs/day: 1.50    Average packs/day: 1.5 packs/day for 48.8 years (73.1 ttl pk-yrs)    Types: Cigarettes    Start date: 1976    Passive exposure: Current   Smokeless tobacco: Never   Tobacco comments:    Smoking 10 per day. No longer smoking marijuana.  04/07/2022 hfb  Vaping Use   Vaping status: Never Used  Substance and Sexual Activity   Alcohol use: Yes    Alcohol/week: 3.0 standard drinks of alcohol    Types: 3 Cans of beer per week    Comment: 3 beers per day   Drug use: Yes    Types: Marijuana    Comment: quit 5-10 years ago   Sexual activity: Not Currently  Other Topics Concern   Not on file  Social History Narrative   Lives by himself   Has a car    Sister [place],  lives in  [place], she is willing to help her brother, she will be checking MyCchart messages and coordinating his appointments.  She can be reached at [phone]".   Social Determinants of Health   Financial Resource Strain: Low Risk  (02/04/2022)   Overall Financial Resource Strain (CARDIA)    Difficulty of Paying Living Expenses: Not hard at all  Food Insecurity: No Food Insecurity (05/11/2023)   Hunger Vital Sign    Worried About Running Out of Food in the Last Year: Never true    Ran Out of Food in the Last Year: Never true  Transportation Needs: No Transportation Needs (05/11/2023)   PRAPARE - Administrator, Civil Service (Medical): No    Lack of Transportation (Non-Medical): No  Physical Activity: Inactive (02/04/2022)   Exercise Vital Sign    Days of Exercise per Week: 0 days    Minutes of Exercise per Session: 0 min  Stress: Stress Concern Present (11/10/2022)   [employer] of Occupational Health - Occupational Stress Questionnaire    Feeling of Stress : To some extent  Social Connections: Moderately Isolated (03/17/2022)   Social Connection and Isolation Panel [NHANES]    Frequency of Communication with Friends and Family: More than three times a week    Frequency of Social Gatherings with Friends and Family: Three times a week    Attends Religious Services: More than 4 times per year    Active Member of Clubs or Organizations: No    Attends Banker Meetings: Never    Marital Status: Divorced  Catering manager Violence: Not At Risk (02/04/2022)   Humiliation, Afraid, Rape, and Kick questionnaire    Fear of Current or Ex-Partner: No    Emotionally Abused: No    Physically Abused: No    Sexually Abused: No    Family History  Problem Relation Age of Onset   Heart disease Mother    Heart failure Mother    Diabetes Mother    Colon cancer Father        late 58s   Prostate cancer Neg Hx     Review of Systems:  As stated in the HPI and otherwise  negative.   BP 126/60   Pulse (!) 57   Ht 5\' 11"  (1.803 m)   Wt 95.9 kg   SpO2 94%   BMI 29.48 kg/m   Physical Examination:  General: Well developed, well nourished, NAD  HEENT: OP clear, mucus membranes moist  SKIN: warm, dry. No rashes. Neuro: No focal deficits  Musculoskeletal: Muscle strength 5/5 all ext  Psychiatric: Mood and affect normal  Neck: No JVD, no carotid bruits, no thyromegaly, no lymphadenopathy.  Lungs:Coarse BS bilaterally. No wheezes, rhonci, crackles Cardiovascular: Regular rate and rhythm. No murmurs, gallops or rubs. Abdomen:Soft. Bowel sounds present. Non-tender.  Extremities: No lower extremity edema. Pulses are 2 + in the bilateral DP/PT.  EKG:  EKG is not ordered today. The ekg ordered today demonstrates   Recent Labs: 12/09/2022: BUN 12; Creatinine, Ser 1.09; Hemoglobin 15.6; Platelets 162.0; Potassium 4.1; Sodium 136   Lipid Panel    Component Value Date/Time   CHOL 127 04/29/2022 1515   CHOL 171 11/14/2019 1441   TRIG 101.0 04/29/2022 1515   HDL 47.20 04/29/2022 1515   HDL 37 (L) 11/14/2019 1441   CHOLHDL 3 04/29/2022 1515   VLDL 20.2 04/29/2022 1515   LDLCALC 60 04/29/2022 1515   LDLCALC 116 (H) 11/14/2019 1441   LDLDIRECT 116.0 05/12/2013  1517     Wt Readings from Last 3 Encounters:  05/14/23 95.9 kg  04/27/23 97.5 kg  12/09/22 94.9 kg    Assessment and Plan:   1. CAD without angina: No chest pain suggestive of angina. He is s/p 3V CABG in March 2010 and has had bare metal stent to SVG to Circumflex in November 2010 as well as DES placed in the body of the SVG to OM in November 2017 in the setting of lateral STEMI. LV function is normal by echo in 2023. Nuclear stress test in February 2024 with no clear ischemia but suggestive that LV function may be down. He was scheduled for an echo but he did not arrange his echo as suggested.  Will continue ASA, Plavix, statin, Zetia and Imdur.  Will repeat echo now.  He has not been on  Lopressor due to bradycardia and dizziness.   2. Chronic diastolic CHF: Volume status is ok today. Weight is stable. Continue Lasix as needed. He has it at home.    3. HTN: BP is well controlled. No changes  4. PAD: Followed in [facility] clinic by Dr. Kirke Corin.   5. Hyperlipidemia: LDL at goal in September 2023. Continue statin.   6. Tobacco abuse: Smoking cessation is encouraged  7. AAA: 3.3 cm AAA. Followed in [facility]  Labs/ tests ordered today include:  No orders of the defined types were placed in this encounter.  Disposition:   F/U with me in 12  months  Signed, Verne Carrow, MD 05/14/2023 3:56 PM    [facility] [address] [place], [place], [place]  [address] Phone: [phone]; Fax: [phone]

## 2023-05-17 ENCOUNTER — Telehealth: Payer: Self-pay | Admitting: Pharmacist

## 2023-05-17 ENCOUNTER — Ambulatory Visit: Payer: Medicare HMO | Admitting: Pharmacist

## 2023-05-17 DIAGNOSIS — E1169 Type 2 diabetes mellitus with other specified complication: Secondary | ICD-10-CM

## 2023-05-17 DIAGNOSIS — J449 Chronic obstructive pulmonary disease, unspecified: Secondary | ICD-10-CM

## 2023-05-17 DIAGNOSIS — I5032 Chronic diastolic (congestive) heart failure: Secondary | ICD-10-CM

## 2023-05-17 NOTE — Progress Notes (Unsigned)
Pharmacy Note  05/17/2023 Name: [name]. MRN: [id number] DOB: Aug 28, 1960  Subjective: [name]. is a 61 y.o. year old male who is a primary care patient of Wanda Plump, MD. Clinical Pharmacist Practitioner referral was placed to assist with medication and CHF and diabetes management.    Engaged with patient and his sister today   by phone for medication management follow up   CHF/ HTN:  Current therapy: Farxiga 5mg  daily, valsartan 40mg  daily, Spironolactone 25mg  0.5 tablet daily Checking weight a few times per week. His current weight is 214lbs    Type 2 DM:  Current therapy - taking Farxiga 5mg  daily but is more for CHF.  Great weight loss since starting [place] Patient does not check blood glucose at home.  Hyperlipidemia: last LDL was at goal @ 60 and triglycerides were 101. Patient is taking atorvastatin 80mg  daily, ezetimibe 10mg  daily and fenofibrate 160mg  daily.    Medication Management:  Patient has been receiving Farxiga 5mg  thru [place] [facility]. Will expire 08/10/2023.  He report he just received a new shipment of 90 day supply.   Patient also mentions he stopped escitalopram because he ran out    SDOH (Social Determinants of Health) assessments and interventions performed:  SDOH Interventions    Flowsheet Row Patient Outreach from 05/11/2023 in  POPULATION HEALTH DEPARTMENT Care Coordination from 11/30/2022 in [facility] Coordination Care Coordination from 11/10/2022 in [facility] Community Care Coordination Chronic Care Management from 06/09/2022 in [facility] at [name] Chronic Care Management from 03/17/2022 in [facility] Primary Care at [facility] Chronic Care Management from 02/04/2022 in [facility] Primary Care at [facility]  SDOH Interventions        Food Insecurity Interventions Intervention Not Indicated --  Intervention Not Indicated Intervention Not Indicated -- Intervention Not Indicated  Housing Interventions -- -- Intervention Not Indicated Intervention Not Indicated -- Intervention Not Indicated  Transportation Interventions Intervention Not Indicated -- Intervention Not Indicated Intervention Not Indicated -- Intervention Not Indicated  Utilities Interventions -- -- Intervention Not Indicated Intervention Not Indicated -- --  Alcohol Usage Interventions Intervention Not Indicated (Score <7) -- -- -- -- --  Depression Interventions/Treatment  -- Counseling  [will discuss all treatment options] -- -- -- --  Financial Strain Interventions -- -- -- -- -- Intervention Not Indicated  Physical Activity Interventions -- -- -- -- -- Patient Refused  Stress Interventions -- -- Provide Counseling -- -- Intervention Not Indicated  Social Connections Interventions -- -- -- -- Intervention Not Indicated Intervention Not Indicated        Objective: Review of patient status, including review of consultants reports, laboratory and other test data, was performed as part of comprehensive evaluation and provision of chronic care management services.   Lab Results  Component Value Date   CREATININE 1.09 12/09/2022   CREATININE 1.10 04/29/2022   CREATININE 1.09 02/05/2022    Lab Results  Component Value Date   HGBA1C 6.7 (H) 12/09/2022       Component Value Date/Time   CHOL 127 04/29/2022 1515   CHOL 171 11/14/2019 1441   TRIG 101.0 04/29/2022 1515   HDL 47.20 04/29/2022 1515   HDL 37 (L) 11/14/2019 1441   CHOLHDL 3 04/29/2022 1515   VLDL 20.2 04/29/2022 1515   LDLCALC 60 04/29/2022 1515   LDLCALC 116 (H) 11/14/2019 1441   LDLDIRECT 116.0  05/12/2013 1517     Clinical ASCVD: Yes  The ASCVD Risk score (Arnett DK, et al., 2019) failed to calculate for the following reasons:   The patient has a prior MI or stroke diagnosis    BP Readings from Last 3 Encounters:  05/14/23 126/60  04/27/23  (!) 141/79  12/09/22 134/68     Allergies  Allergen Reactions   Metformin And Related Diarrhea and Other (See Comments)    Severe diarrhea   Wellbutrin [Bupropion] Nausea Only    Medications Reviewed Today   Medications were not reviewed in this encounter     Patient Active Problem List   Diagnosis Date Noted   Palliative care encounter 04/13/2022   COPD (chronic obstructive pulmonary disease) (HCC) 04/07/2022   Class 1 obesity 12/25/2021   Acute on chronic diastolic heart failure (HCC) 12/23/2021   Chronic respiratory failure with hypoxia (HCC) 12/23/2021   COPD exacerbation (HCC) 12/23/2021   Alcohol use 12/23/2021   (HFpEF) heart failure with preserved ejection fraction (HCC)    DOE (dyspnea on exertion) 11/07/2020   Type 2 diabetes mellitus with hyperlipidemia (HCC) 03/18/2020   Prolapsed lumbar disc 01/25/2020   Hiatal hernia    Hepatic steatosis    Hemianopia, homonymous, right    ETOH abuse    Esophagitis    Chronic diastolic CHF (congestive heart failure) (HCC)    Chronic bronchitis (HCC)    CAD (coronary artery disease)    PAD (peripheral artery disease) (HCC) 09/23/2016   Morbid obesity (HCC)    Chronic pain syndrome 10/23/2015   GERD (gastroesophageal reflux disease) 08/07/2015   PCP NOTES >>>>> 06/01/2015   Annual physical exam 05/31/2015   Acute ischemic stroke (HCC) 02/20/2015   BPH (benign prostatic hyperplasia) 02/20/2015   OSA 05/05/2013   Erectile dysfunction 03/17/2012   Emphysema (subcutaneous) (surgical) resulting from a procedure 10/08/2008   Iron deficiency anemia 08/10/2008   ABNORMALITY OF GAIT 08/06/2008   Cigarette smoker 07/27/2008   Essential hypertension 07/26/2008   ST elevation myocardial infarction involving left circumflex coronary artery (HCC) 07/26/2008     Medication Assistance:  [name] obtained through [facility] medication assistance program.  Sent in updated Rx for Farxiga to [facility]. Enrollment ends 08/10/2023 -  pateint should received shipment about 1 week.    Assessment / Plan:  CHF - stable Continue Farxiga and valsartan Reviewed signs and symptoms of CHF exacerbation - weight gain, SOB, abdominal fullness, swelling in legs or abdomen, Fatigue and weakness, changes in ability to perform usual activities, persistent cough or wheezing with white or pink blood-tinged mucus, nausea and lack of appetite Continue to weigh daily - report weight gain of more than 3 lbs in 24 hours or 5 lbs in 1 week.  Regarding metoprolol - this medication was discontinued in 2023 due to low blood pressure. He has not filled it since 09/2021 and should not have any left that he could take.  Patient's sister is advised if he has any metoprolol at home to discontinue.   Type 2 DM - at goal, [place] mostly for CHF Continue Farxiga 5mg  daily Ok to not check blood glucose at home at this time. IF A1c increases to > 7% would consider glucometer.   Hyperlipidemia - at goal of LDL < 70. Continue ezetimibe, atorvastatin 80mg  daily and fenofibrate.  Could consider holding fenofibrate to see if Tg remain at goal of < 150 - will discuss with patient more during appt 01/12/2023  Med Management:  Reviewed Rx and  over-the-counter meds. Updated med list.  Reviewed refill history and adherence.  Called [facility] and they show trazodone refill is in progress and patient should receive in 5 to 7 business days from 12/28/2022.   Will ask PCP about calling in 7 day supply to local pharmacy / [facility] to last until his order is received. Patient will have to purchase off insurance.   Follow Up:  Telephone follow up appointment with care management team member scheduled for:  01/12/2023 Next PCP appointment scheduled for:  03/15/2023   Henrene Pastor, PharmD Clinical Pharmacist [facility]  - [facility] [phone]

## 2023-05-17 NOTE — Telephone Encounter (Signed)
Unsuccessful outreach to patient. LM on VM with CB# [phone] or [phone]

## 2023-05-19 NOTE — Telephone Encounter (Signed)
Patient called back - see notes from 05/17/2023

## 2023-05-23 ENCOUNTER — Other Ambulatory Visit: Payer: Self-pay | Admitting: Internal Medicine

## 2023-05-29 ENCOUNTER — Other Ambulatory Visit: Payer: Self-pay | Admitting: Cardiovascular Disease

## 2023-05-31 ENCOUNTER — Other Ambulatory Visit: Payer: Self-pay

## 2023-06-01 ENCOUNTER — Ambulatory Visit: Payer: Medicare HMO | Admitting: Internal Medicine

## 2023-06-02 NOTE — Patient Outreach (Signed)
Care Management   Visit Note   Name: [name]. MRN: [id number] DOB: 1961/04/04  Subjective: [name]. is a 62 y.o. year old male who is a primary care patient of Wanda Plump, MD. The Care Management team was consulted for assistance.      Engaged with patient via telephone.  Assessment:  Outpatient Encounter Medications as of 05/31/2023  Medication Sig Note   albuterol (VENTOLIN HFA) 108 (90 Base) MCG/ACT inhaler Inhale 2 puffs into the lungs every 6 (six) hours as needed for wheezing or shortness of breath.    aspirin EC 81 MG EC tablet Take 1 tablet (81 mg total) by mouth daily with breakfast.    atorvastatin (LIPITOR) 80 MG tablet Take 1 tablet (80 mg total) by mouth daily.    budesonide-formoterol (SYMBICORT) 160-4.5 MCG/ACT inhaler Inhale 2 puffs into the lungs 2 (two) times daily. (Patient not taking: Reported on 05/17/2023)    Cholecalciferol (VITAMIN D-3) 125 MCG (5000 UT) TABS Take 5,000 Units by mouth daily.    clopidogrel (PLAVIX) 75 MG tablet TAKE 1 TABLET EVERY DAY    Cyanocobalamin (VITAMIN B-12) 5000 MCG SUBL Place 5,000 mcg under the tongue daily. 12/09/2022: Sometimes   dapagliflozin propanediol (FARXIGA) 5 MG TABS tablet Take 1 tablet (5 mg total) by mouth daily before breakfast. 05/17/2023: [place] thru 08/10/2023   escitalopram (LEXAPRO) 10 MG tablet Take 1 tablet (10 mg total) by mouth daily. (Patient not taking: Reported on 05/17/2023) 05/31/2023: Reports currently not taking. Will confirm plan with PCP   ezetimibe (ZETIA) 10 MG tablet TAKE 1 TABLET EVERY DAY (NEED MD APPOINTMENT FOR REFILLS)    fenofibrate (TRICOR) 145 MG tablet Take 1 tablet (145 mg total) by mouth daily.    folic acid (FOLVITE) 1 MG tablet Take 1 tablet (1 mg total) by mouth daily. (Patient not taking: Reported on 05/17/2023)    guaiFENesin (MUCINEX) 600 MG 12 hr tablet Take 600 mg by mouth 2 (two) times daily as needed for cough. 12/09/2022: PRN    isosorbide mononitrate (IMDUR) 60 MG 24 hr tablet TAKE 1 AND 1/2 TABLETS EVERY DAY    oxyCODONE-acetaminophen (PERCOCET) 10-325 MG tablet Take 1 tablet by mouth every 6 (six) hours as needed for pain.    OXYGEN Inhale 2-4 L/min into the lungs as needed (for shortness of breath). 05/11/2023: Continues to use as needed   pantoprazole (PROTONIX) 40 MG tablet TAKE 1 TABLET TWICE DAILY (NEED MD APPOINTMENT FOR REFILLS)    PRESCRIPTION MEDICATION CPAP- At bedtime (Patient not taking: Reported on 05/17/2023)    spironolactone (ALDACTONE) 25 MG tablet TAKE 1/2 TABLET EVERY DAY    tamsulosin (FLOMAX) 0.4 MG CAPS capsule Take 1 capsule (0.4 mg total) by mouth in the morning and at bedtime.    thiamine 100 MG tablet Take 1 tablet (100 mg total) by mouth daily.    traZODone (DESYREL) 100 MG tablet Take 0.5 tablets (50 mg total) by mouth at bedtime. 05/31/2023: Reports taking a different dose. Currently taking 100 mg/2 tablets at bedtime. Reports pending delivery from [facility]   valsartan (DIOVAN) 40 MG tablet Take 1 tablet (40 mg total) by mouth daily.    No facility-administered encounter medications on file as of 05/31/2023.    Interventions:   Goals Addressed             This Visit's Progress    Care Management       Current Barriers:  Care Management  support and education needs related to CHF, HTN, COPD and DM  Planned Interventions: COPD Reviewed plan for COPD management.  Reviewed medications. Reports symptoms have been well controlled. Reports using albuterol occasionally. Reports no longer using Symbicort.  Discussed activity tolerance. His activity level remains limited due to chronic back pain. Reports not being able to stand for prolonged periods but able to complete ADLs independently. Continues to experience dyspnea with activity. Reports using supplemental oxygen at 2-4 L/min as needed. Reports using mostly at night. Reviewed current action plan and reinforced importance of daily  self-assessment. Advised to make an appointment with provider if experiencing moderate symptoms for greater than 48 hours without improvement. Reports congestion and a productive cough for several days. Describes sputum as clear to light yellow. Thoroughly reviewed s/sx of respiratory infection and indications for seeking medical follow up.  Provided information regarding infection prevention and increased risk r/t COPD. Advised to utilize prevention strategies to reduce risk of infection. Reviewed worsening symptoms that require immediate medical attention.    Patient Goals/Self-Care Complete appointments with the medical team as scheduled Take medications and use inhalers as prescribed Assess symptoms daily Notify provider if in the yellow zone for 48 hrs without improvement Follow recommendations to prevent respiratory infection Notify provider or care management team with questions and new concerns as needed  ///////////////////////////////////////////////////////////////////////////////////////////////////////////////////////////////////////////// Planned Interventions: HTN/CHF/CAD  Reviewed current treatment plans. Reviewed medications. Reports taking medications as prescribed. Reports a family member is assisting with medication preparation and management.  Reviewed blood pressure parameters. Reports not monitoring daily but routinely. Could not recall exact readings today but reports they have been within range. Advised to monitor at least a few times a week if not monitoring daily. Advised to keep a BP log to identify trends. Reviewed indications for notifying a provider. Reviewed weight parameters and need to notify a provider for weight gain greater than 3 lbs overnight or weight gain greater than 5 lbs within a week. Reports weighing at least weekly. Unable to recall last weight but reports weights have been stable. Reviewed symptoms. Denies chest pain or palpitations. Denies  headaches, dizziness, or visual changes. Reports minimal swelling to lower extremities. He experiences dyspnea with activity. Denies symptoms at rest. Discussed nutritional intake. Reports doing well with meals.  Reports he has not been given a fluid restriction. Encouraged to read nutrition labels, continue monitoring sodium intake, and avoid highly processed foods when possible. Reviewed s/sx of heart attack, stroke and worsening symptoms that require immediate medical attention. Screening for signs and symptoms of depression related to chronic disease state Reviewed appointments. Schedule for Echocardiogram on 06/03/23   BP Readings from Last 3 Encounters:  05/14/23 126/60  04/27/23 (!) 141/79  12/09/22 134/68      Wt Readings from Last 3 Encounters:  05/14/23 211 lb 6.4 oz (95.9 kg)  04/27/23 215 lb (97.5 kg)  12/09/22 209 lb 2 oz (94.9 kg)    Patient Goals/Self-Care Take medications as prescribed   Attend medical appointments as scheduled Complete Echocardiogram as scheduled on 06/03/23 Call pharmacy for medication refills 3-7 days in advance of running out of medications Call provider office for new concerns or questions  Check blood pressure and keep a blood pressure log Weight at least once a week if unable to weigh daily and record readings Call doctor for signs and symptoms of high blood pressure Report new symptoms to your doctor Monitor sodium intake. Limit foods high in cholesterol. Increase intake of fruits and vegetable. Avoid highly processed  foods when possible.  /////////////////////////////////////////////////////////////////////////////////////////////////////// Planned Interventions: DM  Reviewed provider's plan for diabetes management. Reports taking medications as prescribed. Reports not monitoring blood sugars. AIC is currently less than 7%. Currently working with clinic Pharmacist regarding prescription costs. Reviewed s/sx of hypoglycemia and  hyperglycemia along with appropriate interventions. De Reviewed nutritional intake and importance of complying with a diabetic diet. Advised to monitor intake of carbohydrates and avoid foods and beverages with added sugar when possible.    Lab Results  Component Value Date   HGBA1C 6.7 (H) 12/09/2022    Patient Goals/Self-Care Attend all scheduled provider appointments Take medications as prescribed Complete Eye Exam Check feet daily for cuts, sores or redness Wash and dry feet carefully every day Wear comfortable, cotton socks Wear comfortable, well-fitting shoes Read food labels for fat, fiber, carbohydrates and portion size Call provider office for new concerns or questions          PLAN Will follow up within the next week   [facility]  [facility], [facility] RN Care Manager Direct Dial: [phone] Website: [email or web address]

## 2023-06-03 ENCOUNTER — Ambulatory Visit (HOSPITAL_COMMUNITY): Payer: Medicare HMO | Attending: Cardiology

## 2023-06-03 DIAGNOSIS — I5032 Chronic diastolic (congestive) heart failure: Secondary | ICD-10-CM | POA: Insufficient documentation

## 2023-06-03 LAB — ECHOCARDIOGRAM COMPLETE
Area-P 1/2: 2.93 cm2
S' Lateral: 4.1 cm

## 2023-06-04 ENCOUNTER — Other Ambulatory Visit: Payer: Self-pay

## 2023-06-04 NOTE — Patient Outreach (Unsigned)
Care Management   Visit Note  06/04/2023 Name: [name]. MRN: [id number] DOB: [birth year]  Subjective: [name]. is a 62 y.o. year old male who is a primary care patient of Wanda Plump, MD. The Care Management team was consulted for assistance.      Engaged with patient via telephone  Assessment:  Outpatient Encounter Medications as of 06/04/2023  Medication Sig Note   albuterol (VENTOLIN HFA) 108 (90 Base) MCG/ACT inhaler Inhale 2 puffs into the lungs every 6 (six) hours as needed for wheezing or shortness of breath.    aspirin EC 81 MG EC tablet Take 1 tablet (81 mg total) by mouth daily with breakfast.    atorvastatin (LIPITOR) 80 MG tablet Take 1 tablet (80 mg total) by mouth daily.    budesonide-formoterol (SYMBICORT) 160-4.5 MCG/ACT inhaler Inhale 2 puffs into the lungs 2 (two) times daily. (Patient not taking: Reported on 05/17/2023)    Cholecalciferol (VITAMIN D-3) 125 MCG (5000 UT) TABS Take 5,000 Units by mouth daily.    clopidogrel (PLAVIX) 75 MG tablet TAKE 1 TABLET EVERY DAY    Cyanocobalamin (VITAMIN B-12) 5000 MCG SUBL Place 5,000 mcg under the tongue daily. 12/09/2022: Sometimes   dapagliflozin propanediol (FARXIGA) 5 MG TABS tablet Take 1 tablet (5 mg total) by mouth daily before breakfast. 05/17/2023: [place] thru 08/10/2023   escitalopram (LEXAPRO) 10 MG tablet Take 1 tablet (10 mg total) by mouth daily. (Patient not taking: Reported on 05/17/2023) 05/31/2023: Reports currently not taking. Will confirm plan with PCP   ezetimibe (ZETIA) 10 MG tablet TAKE 1 TABLET EVERY DAY (NEED MD APPOINTMENT FOR REFILLS)    fenofibrate (TRICOR) 145 MG tablet Take 1 tablet (145 mg total) by mouth daily.    folic acid (FOLVITE) 1 MG tablet Take 1 tablet (1 mg total) by mouth daily. (Patient not taking: Reported on 05/17/2023)    guaiFENesin (MUCINEX) 600 MG 12 hr tablet Take 600 mg by mouth 2 (two) times daily as needed for cough. 12/09/2022: PRN    isosorbide mononitrate (IMDUR) 60 MG 24 hr tablet TAKE 1 AND 1/2 TABLETS EVERY DAY    methocarbamol (ROBAXIN) 500 MG tablet Take by mouth. 06/04/2023: Return return call from [facility] regarding status of order   oxyCODONE-acetaminophen (PERCOCET) 10-325 MG tablet Take 1 tablet by mouth every 6 (six) hours as needed for pain. 06/04/2023: Needs new order   OXYGEN Inhale 2-4 L/min into the lungs as needed (for shortness of breath). 05/11/2023: Continues to use as needed   pantoprazole (PROTONIX) 40 MG tablet TAKE 1 TABLET TWICE DAILY (NEED MD APPOINTMENT FOR REFILLS)    PRESCRIPTION MEDICATION CPAP- At bedtime (Patient not taking: Reported on 05/17/2023)    spironolactone (ALDACTONE) 25 MG tablet TAKE 1/2 TABLET EVERY DAY    tamsulosin (FLOMAX) 0.4 MG CAPS capsule Take 1 capsule (0.4 mg total) by mouth in the morning and at bedtime.    thiamine 100 MG tablet Take 1 tablet (100 mg total) by mouth daily.    traZODone (DESYREL) 100 MG tablet Take 0.5 tablets (50 mg total) by mouth at bedtime. 05/31/2023: Reports taking a different dose. Currently taking 100 mg/2 tablets at bedtime. Reports pending delivery from [facility]   valsartan (DIOVAN) 40 MG tablet Take 1 tablet (40 mg total) by mouth daily.    No facility-administered encounter medications on file as of 06/04/2023.

## 2023-06-07 ENCOUNTER — Ambulatory Visit (INDEPENDENT_AMBULATORY_CARE_PROVIDER_SITE_OTHER): Payer: Medicare HMO | Admitting: Internal Medicine

## 2023-06-07 ENCOUNTER — Encounter: Payer: Self-pay | Admitting: Internal Medicine

## 2023-06-07 VITALS — BP 126/68 | HR 77 | Temp 98.1°F | Resp 22 | Ht 71.0 in | Wt 214.1 lb

## 2023-06-07 DIAGNOSIS — Z91199 Patient's noncompliance with other medical treatment and regimen due to unspecified reason: Secondary | ICD-10-CM

## 2023-06-07 DIAGNOSIS — L989 Disorder of the skin and subcutaneous tissue, unspecified: Secondary | ICD-10-CM | POA: Diagnosis not present

## 2023-06-07 DIAGNOSIS — R0902 Hypoxemia: Secondary | ICD-10-CM

## 2023-06-07 DIAGNOSIS — E785 Hyperlipidemia, unspecified: Secondary | ICD-10-CM | POA: Diagnosis not present

## 2023-06-07 DIAGNOSIS — I10 Essential (primary) hypertension: Secondary | ICD-10-CM | POA: Diagnosis not present

## 2023-06-07 DIAGNOSIS — G4733 Obstructive sleep apnea (adult) (pediatric): Secondary | ICD-10-CM | POA: Diagnosis not present

## 2023-06-07 DIAGNOSIS — F17210 Nicotine dependence, cigarettes, uncomplicated: Secondary | ICD-10-CM | POA: Diagnosis not present

## 2023-06-07 DIAGNOSIS — Z7984 Long term (current) use of oral hypoglycemic drugs: Secondary | ICD-10-CM

## 2023-06-07 DIAGNOSIS — E1169 Type 2 diabetes mellitus with other specified complication: Secondary | ICD-10-CM

## 2023-06-07 DIAGNOSIS — J449 Chronic obstructive pulmonary disease, unspecified: Secondary | ICD-10-CM | POA: Diagnosis not present

## 2023-06-07 MED ORDER — TRAZODONE HCL 100 MG PO TABS: 200.0000 mg | ORAL_TABLET | Freq: Every day | ORAL | 1 refills | Status: DC

## 2023-06-07 MED ORDER — TRAZODONE HCL 100 MG PO TABS: 100.0000 mg | ORAL_TABLET | Freq: Every day | ORAL | Status: DC

## 2023-06-07 NOTE — Progress Notes (Signed)
Subjective:    Patient ID: [name]., male    DOB: 06-Feb-1961, 62 y.o.   MRN: [id number]  DOS:  06/07/2023 Type of visit - description: Follow-up, here with his sister  Chronic medical problems addressed. Compliance? His main concern now is back pain. Needs a referral to dermatology Still smoking and drinks.  Review of Systems See above   Past Medical History:  Diagnosis Date   (HFpEF) heart failure with preserved ejection fraction (HCC)    Echo 6/22: EF 55-60, no RWMA, GR 3 DD, elevated LVEDP, normal RVSF, RVSP 53.2, mild to moderate AV sclerosis without stenosis, dilated aortic root (41 mm), ascending aorta 37 mm   AAA (abdominal aortic aneurysm) (HCC)    Abnormality of gait    Anxiety    CAD (coronary artery disease)    a. 10/2008 Inferolateral STEMI: 3VD w/ occluded LCX (PTCA)-->CABG x 3 (LIMA->LAD, VG->OM, VG->PDA);  b. 06/2009 NSTEMI/PCI: VG->OM 100 (BMS);  c. 05/2013 native 3VD, 3/3 patent grafts. d. 06/2016: Inferior STEMI w/ occluded SVG-OM (DES placed). LIMA-LAD and SVG-PDA patent.   Chronic bronchitis (HCC)    Chronic diastolic CHF (congestive heart failure) (HCC)    a. 02/2015 Echo: EF 50-55%, distal septal, apical, inferobasal HK, mild LVH, mild MR, mildly dil LA.   Cocaine use    Colon polyps    a. 09/2015 colonscopy: multiple sessile polyps, path negative for high grade dysplasia.   Depression    Emphysema (subcutaneous) (surgical) resulting from a procedure 10/2008   Bleb resected at time of 10/2008 CABG. Marketed emphysema noted on surgical report.   Esophagitis    a. 2017 EGD: esophagitis, duodenitis.   ETOH abuse    GERD (gastroesophageal reflux disease)    Hemianopia, homonymous, right    "can't see out the right side of either eye since stroke in 2016/pt description 09/23/2016   Hepatic steatosis    Hiatal hernia    History of nuclear stress test 08/2017   Nuclear stress test 1/19: EF 35, inf-lat and ant-lat, apical sept/apical scar; no  ischemia; Intermediate Risk   Hyperlipidemia    Hypertension    Iron deficiency anemia 2010   Morbid obesity (HCC)    Myocardial infarction [facility]) "several"   PAD (peripheral artery disease) (HCC)    Pancreatitis 06/2015   Sleep apnea    "don't have a mask" (09/23/2016)   Stroke (HCC) 02/2015   a. 02/2015 Carotid U/S: 1-39% bilat ICA stenosis; "left me blind" (09/23/2016)   Tobacco abuse    still smokes   Tubular adenoma of colon     Past Surgical History:  Procedure Laterality Date   ABDOMINAL AORTOGRAM W/LOWER EXTREMITY N/A 09/23/2016   Procedure: Abdominal Aortogram w/Lower Extremity;  Surgeon: Iran Ouch, MD;  Location: [facility] INVASIVE CV LAB;  Service: Cardiovascular;  Laterality: N/A;   ABDOMINAL AORTOGRAM W/LOWER EXTREMITY N/A 02/18/2022   Procedure: ABDOMINAL AORTOGRAM W/LOWER EXTREMITY;  Surgeon: Iran Ouch, MD;  Location: [facility] INVASIVE CV LAB;  Service: Cardiovascular;  Laterality: N/A;   CARDIAC CATHETERIZATION  11/07/2008   EF of 50-55% -- normal LV systolic function, acute inferolateral ST elevation MI,  PTCA of the circumflex artery -- Colleen Can. Deborah Chalk, [name]   CARDIAC CATHETERIZATION N/A 07/07/2016   Procedure: Left Heart Cath and Cors/Grafts Angiography;  Surgeon: Peter M Swaziland, MD;  Location: [facility] INVASIVE CV LAB;  Service: Cardiovascular;  Laterality: N/A;   CARDIAC CATHETERIZATION N/A 07/07/2016   Procedure: Coronary Stent Intervention;  Surgeon: Peter M Swaziland,  MD;  Location: [facility] INVASIVE CV LAB;  Service: Cardiovascular;  Laterality: N/A;   CORONARY ANGIOPLASTY     CORONARY ARTERY BYPASS GRAFT     "CABG X4"   LEFT HEART CATH AND CORS/GRAFTS ANGIOGRAPHY N/A 10/01/2017   Procedure: LEFT HEART CATH AND CORS/GRAFTS ANGIOGRAPHY;  Surgeon: Kathleene Hazel, MD;  Location: [facility] INVASIVE CV LAB;  Service: Cardiovascular;  Laterality: N/A;   LEFT HEART CATHETERIZATION WITH CORONARY/GRAFT ANGIOGRAM N/A 05/15/2013   Procedure: LEFT HEART CATHETERIZATION WITH  Isabel Caprice;  Surgeon: Peter M Swaziland, MD;  Location: [facility] CATH LAB;  Service: Cardiovascular;  Laterality: N/A;   PERIPHERAL VASCULAR ATHERECTOMY  02/18/2022   Procedure: PERIPHERAL VASCULAR ATHERECTOMY;  Surgeon: Iran Ouch, MD;  Location: [facility] INVASIVE CV LAB;  Service: Cardiovascular;;   PERIPHERAL VASCULAR INTERVENTION  09/23/2016   Procedure: Peripheral Vascular Intervention;  Surgeon: Iran Ouch, MD;  Location: [facility] INVASIVE CV LAB;  Service: Cardiovascular;;  Lt. SFA    Current Outpatient Medications  Medication Instructions   albuterol (VENTOLIN HFA) 108 (90 Base) MCG/ACT inhaler 2 puffs, Inhalation, Every 6 hours PRN   aspirin EC 81 mg, Oral, Daily with breakfast   atorvastatin (LIPITOR) 80 mg, Oral, Daily   budesonide-formoterol (SYMBICORT) 160-4.5 MCG/ACT inhaler 2 puffs, Inhalation, 2 times daily   clopidogrel (PLAVIX) 75 MG tablet TAKE 1 TABLET EVERY DAY   dapagliflozin propanediol (FARXIGA) 5 mg, Oral, Daily before breakfast   ezetimibe (ZETIA) 10 MG tablet TAKE 1 TABLET EVERY DAY (NEED MD APPOINTMENT FOR REFILLS)   fenofibrate (TRICOR) 145 mg, Oral, Daily   folic acid (FOLVITE) 1 mg, Oral, Daily   guaiFENesin (MUCINEX) 600 mg, Oral, 2 times daily PRN   isosorbide mononitrate (IMDUR) 60 MG 24 hr tablet TAKE 1 AND 1/2 TABLETS EVERY DAY   methocarbamol (ROBAXIN) 500 MG tablet Oral   oxyCODONE-acetaminophen (PERCOCET) 10-325 MG tablet 1 tablet, Oral, Every 6 hours PRN   OXYGEN 2-4 L/min, Inhalation, As needed   pantoprazole (PROTONIX) 40 MG tablet TAKE 1 TABLET TWICE DAILY (NEED MD APPOINTMENT FOR REFILLS)   PRESCRIPTION MEDICATION CPAP- At bedtime   spironolactone (ALDACTONE) 25 MG tablet TAKE 1/2 TABLET EVERY DAY   tamsulosin (FLOMAX) 0.4 mg, Oral, 2 times daily   thiamine (VITAMIN B1) 100 mg, Oral, Daily   traZODone (DESYREL) 200 mg, Oral, Daily at bedtime   valsartan (DIOVAN) 40 mg, Oral, Daily   Vitamin B-12 5,000 mcg, Sublingual, Daily   Vitamin D-3  5,000 Units, Oral, Daily       Objective:   Physical Exam Eyes:      Comments: Has a firm skin lesion at the right eyebrow, dimple in the center is noted   BP 126/68   Pulse 77   Temp 98.1 F (36.7 C) (Oral)   Resp (!) 22   Ht 5\' 11"  (1.803 m)   Wt 214 lb 2 oz (97.1 kg)   SpO2 (!) 88% Comment: Room air  BMI 29.86 kg/m  General:   Well developed, NAD, BMI noted. HEENT:  Normocephalic . Face symmetric, atraumatic Lungs:  Slightly decreased sounds, few rhonchi.  Prolonged expiratory time. Normal respiratory effort, no intercostal retractions, no accessory muscle use. Heart: RRR,  no murmur.  Lower extremities: no pretibial edema bilaterally  Skin: Not pale. Not jaundice Neurologic:  alert & oriented X3.  Speech normal, gait appropriate for age and unassisted Psych--  Cognition and judgment appear intact.  Cooperative with normal attention span and concentration.  Behavior appropriate. No anxious or depressed  appearing.      Assessment    ASSESSMENT DM HTN Dyslipidemia CV: --CAD: 2010 angioplasty f/u by  CABG 2010, cath 2014 -- CP, cath 09/2017, patent grafts, medical treatment --PVD --Stroke 2008, 02-2015, 2020 -CHF: Heart failure with preserved EF Anxiety depression insomnia Pulm: ---OSA: Sleep study 01/2017, ++ OSA, cpap intolerant --- hypoxemia (nocturnal O2) ---COPD: Clinical diagnosis and CT findings, has not pursued PFTs Abnormal gait GI: Iron def anemia dx 03-2015  ---cscope 09-2015 multiple polyps, repeat in one year ---EGD-2017: Esophagitis, duodenitis, bx done Pancreatitis 06/2015.(EtOH 2 days prior to onset of symptoms) Etoh abuse H/o cocaine  Smoker  +FH: father prostate ca , dx late 60s Back pain:saw Ortho 01/2020, SXS felt to be mechanical due to: JD, deconditioning, residual weakness from the stroke.  PVD likely to be contributing. Vitamin D deficiency    PLAN Compliance: Here with his sister, he is unsure of what medication he takes, he  did mention that he is not taking Symbicort and uses oxygen as needed only. Medication list provided, encouraged to take medications as prescribed, encouraged to call pharmacy for refills. DM: On Farxiga, check A1c. HTN: On Imdur, Aldactone, Diovan.  Check a BMP High cholesterol: On atorvastatin 80 mg, Zetia, check labs. COPD, asthma, OSA: Intolerant to CPAP, uses oxygen "as needed only", apparently not taking inhalers.  O2 sat today 88%, increased to 89% with rest (not on O2 today) Plan:  Encouraged good compliance with inhalers, educated patient about daily medications versus rescue inhalers. Encouraged to use oxygen at home, prescription for a portable oxygen concentrator sent.  Refer to pulmonary. CAD, CHF 05/14/2023, saw cardiology, felt to be stable, no angina, no volume overload. Abdominal aortic aneurysm: LOV with vascular surgery 04/27/2023 Depression, anxiety, insomnia:: Took Lexapro temporarily but eventually stopped because it did not help. Trazodone helps him sleep but he requires 200 mg at night to be effective.  Okay to formally increase it to  200 mg nightly. Rx sent  Low back pain: The patient reports this is the main issue to him, follow-up elsewhere, apparently will require surgery.. Skin lesion: See physical exam, BCC?  Refer to dermatology Tobacco, EtOH: Used to smoke a pack a day, drinks frequently.  Counseled. Preventive care: Declined a flu shot. RTC 4 months  Time spent: 40 minutes, was reviewed, I had a long conversation with the patient and his sister regards compliance.  Multiple issues addressed.

## 2023-06-07 NOTE — Progress Notes (Signed)
SATURATION QUALIFICATIONS: (This note is used to comply with regulatory documentation for home oxygen)  Patient Saturations on Room Air at Rest = 88%  Patient Saturations on Room Air while Ambulating = %  Patient Saturations on  Liters of oxygen while Ambulating = %  Please briefly explain why patient needs home oxygen: 

## 2023-06-07 NOTE — Assessment & Plan Note (Signed)
Compliance: Here with his sister, he is unsure of what medication he takes, he did mention that he is not taking Symbicort and uses oxygen as needed only. Medication list provided, encouraged to take medications as prescribed, encouraged to call pharmacy for refills. DM: On Farxiga, check A1c. HTN: On Imdur, Aldactone, Diovan.  Check a BMP High cholesterol: On atorvastatin 80 mg, Zetia, check labs. COPD, asthma, OSA: Intolerant to CPAP, uses oxygen "as needed only", apparently not taking inhalers.  O2 sat today 88%, increased to 89% with rest (not on O2 today) Plan:  Encouraged good compliance with inhalers, educated patient about daily medications versus rescue inhalers. Encouraged to use oxygen at home, prescription for a portable oxygen concentrator sent.  Refer to pulmonary. CAD, CHF 05/14/2023, saw cardiology, felt to be stable, no angina, no volume overload. Abdominal aortic aneurysm: LOV with vascular surgery 04/27/2023 Depression, anxiety, insomnia:: Took Lexapro temporarily but eventually stopped because it did not help. Trazodone helps him sleep but he requires 200 mg at night to be effective.  Okay to formally increase it to  200 mg nightly. Rx sent  Low back pain: The patient reports this is the main issue to him, follow-up elsewhere, apparently will require surgery.. Skin lesion: See physical exam, BCC?  Refer to dermatology Tobacco, EtOH: Used to smoke a pack a day, drinks frequently.  Counseled. Preventive care: Declined a flu shot. RTC 4 months

## 2023-06-07 NOTE — Patient Instructions (Addendum)
Please follow the medication list provided today. Call the pharmacy if you need a refill.  We are referring you to the pulmonary doctors. You can call them at [phone].  We are trying to get you a portable oxygen machine to get outside the house. Use your oxygen supplements while you are at home.  Vaccines I recommend: Flu shot RSV vaccine Shingrix (shingles) COVID-vaccine       GO TO THE LAB : Get the blood work     Next visit with me in 4 months please schedule it at the front desk       Per our records you are due for your diabetic eye exam. Please contact your eye doctor to schedule an appointment. Please have them send copies of your office visit notes to [place]. Our fax number is [phone]. If you need a referral to an eye doctor please let us know.

## 2023-06-08 ENCOUNTER — Encounter: Payer: Self-pay | Admitting: Internal Medicine

## 2023-06-08 ENCOUNTER — Ambulatory Visit (INDEPENDENT_AMBULATORY_CARE_PROVIDER_SITE_OTHER): Payer: Medicare HMO | Admitting: Internal Medicine

## 2023-06-08 ENCOUNTER — Ambulatory Visit: Payer: Medicare HMO

## 2023-06-08 VITALS — BP 104/64 | HR 69 | Temp 98.2°F | Ht 71.0 in | Wt 213.6 lb

## 2023-06-08 DIAGNOSIS — R059 Cough, unspecified: Secondary | ICD-10-CM | POA: Diagnosis not present

## 2023-06-08 DIAGNOSIS — R0609 Other forms of dyspnea: Secondary | ICD-10-CM | POA: Diagnosis not present

## 2023-06-08 DIAGNOSIS — F17210 Nicotine dependence, cigarettes, uncomplicated: Secondary | ICD-10-CM

## 2023-06-08 DIAGNOSIS — I517 Cardiomegaly: Secondary | ICD-10-CM | POA: Diagnosis not present

## 2023-06-08 DIAGNOSIS — R918 Other nonspecific abnormal finding of lung field: Secondary | ICD-10-CM | POA: Diagnosis not present

## 2023-06-08 LAB — BASIC METABOLIC PANEL
BUN: 16 mg/dL (ref 6–23)
CO2: 29 meq/L (ref 19–32)
Calcium: 9.3 mg/dL (ref 8.4–10.5)
Chloride: 100 meq/L (ref 96–112)
Creatinine, Ser: 1.16 mg/dL (ref 0.40–1.50)
GFR: 68.37 mL/min (ref >60.00)
Glucose, Bld: 89 mg/dL (ref 70–99)
Potassium: 4.4 meq/L (ref 3.5–5.1)
Sodium: 137 meq/L (ref 135–145)

## 2023-06-08 LAB — LIPID PANEL
Cholesterol: 132 mg/dL (ref 0–200)
HDL: 47 mg/dL (ref >39.00)
LDL Cholesterol: 68 mg/dL (ref 0–99)
NonHDL: 84.67
Total CHOL/HDL Ratio: 3
Triglycerides: 81 mg/dL (ref 0.0–149.0)
VLDL: 16.2 mg/dL (ref 0.0–40.0)

## 2023-06-08 LAB — AST: AST: 13 U/L (ref 0–37)

## 2023-06-08 LAB — HEMOGLOBIN A1C: Hgb A1c MFr Bld: 6.6 % — ABNORMAL HIGH (ref 4.6–6.5)

## 2023-06-08 LAB — ALT: ALT: 9 U/L (ref 0–53)

## 2023-06-08 MED ORDER — BREZTRI AEROSPHERE 160-9-4.8 MCG/ACT IN AERO: 2.0000 | INHALATION_SPRAY | Freq: Two times a day (BID) | RESPIRATORY_TRACT | Status: DC

## 2023-06-08 NOTE — Assessment & Plan Note (Addendum)
Echo 11/27/2018 The left ventricle has normal systolic function with an ejection  fraction of 60-65%. The cavity size was normal. There is mildly increased  left ventricular wall thickness. Left ventricular diastolic parameters  were normal.   2. Inferobasal hypokinesis.   3. The right ventricle has normal systolic function. The cavity was  normal. There is no increase in right ventricular wall thickness.   4. Left atrial size was moderately dilated.   5. Mild thickening of the mitral valve leaflet. Mild calcification of the  mitral valve leaflet.   6. The tricuspid valve is grossly normal.   7. The aortic valve was not well visualized. Moderate thickening of the  aortic valve. Sclerosis without any evidence of stenosis of the aortic  valve. No stenosis of the aortic valve.  - Try off acei 11/07/2020  See hbp > much better 03/31/2021  - PFT's  03/31/2021  FEV1 1.93 (53 % ) ratio 0.70  p 13 % improvement from saba p 0 prior to study with   FV curve no signficant curvature  His problems are predominently cardiac at this point but from a pulmonar perspective he can be cleared form surgery with the stipulation he stop smoking if at all possible pre op for at least 2 weeks and he is also cleared by surgery.  Can continue to use breztri 2bid for the AB component of his problem which may be partly cardiac asthma based on echo data.   Discussed in detail all the  indications, usual  risks and alternatives  relative to the benefits with patient who agrees to proceed with Rx as outlined.             Each maintenance medication was reviewed in detail including emphasizing most importantly the difference between maintenance and prns and under what circumstances the prns are to be triggered using an action plan format where appropriate.  Total time for H and P, chart review, counseling, and generating customized AVS unique to this office visit / same day charting = preop eval

## 2023-06-08 NOTE — Progress Notes (Signed)
[name]., male    DOB: 1960/09/13,   MRN: [id number]   Brief patient profile:  Seen by [name] 2014 :  OSA  > stopped cpap on his own   45 yowm quit smoking 10/31/20 referred to pulmonary clinic 11/07/2020 by Dr  [name] for doe/cough/ hoarseness x ??? (pt unable to [name])  While on ACEi chronically    History of Present Illness  11/07/2020  Pulmonary/ 1st office eval/[name]  Voice and swallowing s/p ST eval 09/13/2020 mild risk asp Chief Complaint  Patient presents with   Pulmonary Consult    Referred by Dr [name]. He states he has been told that he had COPD.   Dyspnea:  hc parking but can do even large store if walks slowly   Cough: assoc with choking / strangling / also blacked out from coughing 6 weeks prior to OV Hoarse prior to symbicort  With neg eval by [name] 09/03/20   Sleep: on side bed is flat with pillows  SABA use: not sure helping 02 :  3lpm hs/ not using daytime  Rec Change protonix (pantoprazole)  to 40 mg Take 30- 60 min before your first and last meals of the day  GERD diet reviewed, bed blocks rec  Stop lisinopril and start diovan 80 mg one daily  Plan A = Automatic = Always=   Change symbicort 160 Take 1 puffs first thing in am and then another 2 puffs about 12 hours later.  Work on inhaler technique:  Plan B = Backup (to supplement plan A, not to replace it) Only use your albuterol inhaler as a rescue medication Please schedule a follow up office visit in 6 weeks, call sooner if needed with all medications /inhalers/ solutions in hand so we can verify exactly what you are taking. This includes all medications from all doctors and over the counters  - PFTs on return - don't take the symbicort that day       03/31/2021  f/u ov/[name] re: ? Acei case    maint on nothing   Chief Complaint  Patient presents with   Follow-up    PFT done today. He states his breathing is doing well and denies any new co's. He states he does not really use his symbicort and he does not have  any other inhaler.    Dyspnea:  walks around [employer] ok  Cough: gone off acei, no more choking or presyncope off acei  Sleeping: bed is flat/ 2pillows s am coough SABA use: none  02: none  Covid status: x 3 vax / infected ? Omicron Rec Ok to use symbicort 160 up to 2 puffs every 12 hours as needed for breathing or coughing problems    06/08/2023  f/u ov/[name] re: preop  maint on no regular  meds and still smoking  Chief Complaint  Patient presents with   Follow-up    Referred by Dr. Drue [name] for surgical clearance for back surgery.  Chest congestion and wheezing persistent x several years.  Dyspnea:  back stops him before  breathing  Cough: no am cough but some congestion > mucus is thick usually white/ better with mucinex   Sleeping: bed is flat/ 2 pillows s resp cc  SABA use: not needing  any  02:  has it but  using  prn   Lung cancer screening :  overdue > last one in 03/2021  = RADS2    No obvious day to day or daytime variability or assoc  excess/ purulent sputum or mucus plugs or hemoptysis or cp or chest tightness, subjective wheeze or overt sinus or hb symptoms.    Also denies any obvious fluctuation of symptoms with weather or environmental changes or other aggravating or alleviating factors except as outlined above   No unusual exposure hx or h/o childhood pna/ asthma or knowledge of premature birth.  Current Allergies, Complete Past Medical History, Past Surgical History, Family History, and Social History were reviewed in [name] record.   ROS  The following are not active complaints unless bolded Hoarseness, sore throat, dysphagia, dental problems, itching, sneezing,  nasal congestion or discharge of excess mucus or purulent secretions, ear ache,   fever, chills, sweats, unintended wt loss or wt gain, classically pleuritic or exertional cp,  orthopnea pnd or arm/hand swelling  or leg swelling, presyncope, palpitations, abdominal pain, anorexia,  nausea, vomiting, diarrhea  or change in bowel habits or change in bladder habits, change in stools or change in urine, dysuria, hematuria,  rash, arthralgias, visual complaints, headache, numbness, weakness or ataxia or problems with walking or coordination,  change in mood or  memory.        Current Meds  Medication Sig   albuterol (VENTOLIN HFA) 108 (90 Base) MCG/ACT inhaler Inhale 2 puffs into the lungs every 6 (six) hours as needed for wheezing or shortness of breath.   aspirin EC 81 MG EC tablet Take 1 tablet (81 mg total) by mouth daily with breakfast.   atorvastatin (LIPITOR) 80 MG tablet Take 1 tablet (80 mg total) by mouth daily.   budesonide-formoterol (SYMBICORT) 160-4.5 MCG/ACT inhaler Inhale 2 puffs into the lungs 2 (two) times daily.   Cholecalciferol (VITAMIN D-3) 125 MCG (5000 UT) TABS Take 5,000 Units by mouth daily.   clopidogrel (PLAVIX) 75 MG tablet TAKE 1 TABLET EVERY DAY   Cyanocobalamin (VITAMIN B-12) 5000 MCG SUBL Place 5,000 mcg under the tongue daily.   dapagliflozin propanediol (FARXIGA) 5 MG TABS tablet Take 1 tablet (5 mg total) by mouth daily before breakfast.   ezetimibe (ZETIA) 10 MG tablet TAKE 1 TABLET EVERY DAY (NEED MD APPOINTMENT FOR REFILLS)   fenofibrate (TRICOR) 145 MG tablet Take 1 tablet (145 mg total) by mouth daily.   folic acid (FOLVITE) 1 MG tablet Take 1 tablet (1 mg total) by mouth daily.   guaiFENesin (MUCINEX) 600 MG 12 hr tablet Take 600 mg by mouth 2 (two) times daily as needed for cough.   isosorbide mononitrate (IMDUR) 60 MG 24 hr tablet TAKE 1 AND 1/2 TABLETS EVERY DAY   oxyCODONE-acetaminophen (PERCOCET) 10-325 MG tablet Take 2 tablets by mouth every 6 (six) hours as needed for pain.   OXYGEN Inhale 2-4 L/min into the lungs continuous.   pantoprazole (PROTONIX) 40 MG tablet TAKE 1 TABLET TWICE DAILY (NEED MD APPOINTMENT FOR REFILLS)   tamsulosin (FLOMAX) 0.4 MG CAPS capsule Take 1 capsule (0.4 mg total) by mouth in the morning and at  bedtime.   thiamine 100 MG tablet Take 1 tablet (100 mg total) by mouth daily.   traZODone (DESYREL) 100 MG tablet Take 2 tablets (200 mg total) by mouth at bedtime.   valsartan (DIOVAN) 40 MG tablet Take 1 tablet (40 mg total) by mouth daily.          Past Medical History:  Diagnosis Date   Abnormality of gait    Anxiety    CAD (coronary artery disease)    a. 10/2008 Inferolateral STEMI: 3VD w/ occluded LCX (  PTCA)-->CABG x 3 (LIMA->LAD, VG->OM, VG->PDA);  b. 06/2009 NSTEMI/PCI: VG->OM 100 (BMS);  c. 05/2013 native 3VD, 3/3 patent grafts. d. 06/2016: Inferior STEMI w/ occluded SVG-OM (DES placed). LIMA-LAD and SVG-PDA patent.   Chronic bronchitis (HCC)    Chronic diastolic CHF (congestive heart failure) (HCC)    a. 02/2015 Echo: EF 50-55%, distal septal, apical, inferobasal HK, mild LVH, mild MR, mildly dil LA.   Cocaine use    Colon polyps    a. 09/2015 colonscopy: multiple sessile polyps, path negative for high grade dysplasia.   Depression    Emphysema (subcutaneous) (surgical) resulting from a procedure 10/2008.   Bleb resected at time of 10/2008 CABG. Marketed emphysema noted on surgical report.   Esophagitis    a. 2017 EGD: esophagitis, duodenitis.   ETOH abuse    GERD (gastroesophageal reflux disease)    Hemianopia, homonymous, right    "can't see out the right side of either eye since stroke in 2016/pt description 09/23/2016   Hepatic steatosis    Hiatal hernia    History of nuclear stress test 08/2017   Nuclear stress test 1/19: EF 35, inf-lat and ant-lat, apical sept/apical scar; no ischemia; Intermediate Risk   Hyperlipidemia    Hypertension    Iron deficiency anemia 2010   Morbid obesity (HCC)    Myocardial infarction [facility]) "several"   PAD (peripheral artery disease) (HCC)    Pancreatitis 06/2015   Sleep apnea    "don't have a mask" (09/23/2016)   Stroke (HCC) 02/2015   a. 02/2015 Carotid U/S: 1-39% bilat ICA stenosis; "left me blind" (09/23/2016)   Tobacco abuse     still smokes   Tubular adenoma of colon        Objective:    Wts    06/08/2023     213  03/10/21 217 lb 3.2 oz (98.5 kg)  01/28/21 218 lb (98.9 kg)  01/28/21 218 lb (98.9 kg)    Vital signs reviewed  06/08/2023  - Note at rest 02 sats  95% on RA   General appearance:    amb wm slt gruff voice    HEENT : Oropharynx  clear      Nasal turbinates nl    NECK :  without  apparent JVD/ palpable Nodes/TM    LUNGS: no acc muscle use,  Nl contour chest with minimal insp/exp rhonchi  bilaterally without cough on insp or exp maneuvers   CV:  RRR  no s3 or murmur or increase in P2, and no edema   ABD:  obese soft and nontender with nl inspiratory excursion in the supine position. No bruits or organomegaly appreciated   MS:  Nl gait/ ext warm without deformities Or obvious joint restrictions  calf tenderness, cyanosis or clubbing    SKIN: warm and dry without lesions    NEURO:  alert, approp, nl sensorium with  no motor or cerebellar deficits apparent.      CXR PA and Lateral:   06/08/2023 :    I personally reviewed images and impression is as follows:     CM with mild vasc congestion      Assessment

## 2023-06-08 NOTE — Assessment & Plan Note (Signed)
4-5 min discussion re active cigarette smoking in addition to office E&M  Ask about tobacco use:   ongoing  Advise quitting  I took an extended  opportunity with this patient to outline the consequences of continued cigarette use  in airway disorders based on all the data we have from the multiple national lung health studies (perfomed over decades at millions of dollars in cost)  indicating that smoking cessation, not choice of inhalers or pulmonary physicians, is the most important aspect of his  care and immediate smoking cessation is the best way to reduce pulmonary complications form upcoming back surgery. Assess willingness:  Not committed at this point Assist in quit attempt:  Per PCP when ready Arrange follow up:   Follow up per Primary Care planned  For smoking cessation classes call [phone]

## 2023-06-08 NOTE — Patient Instructions (Signed)
Plan A = Automatic = Always=    Breztri (or symbicort) Take 2 puffs first thing in am and then another 2 puffs about 12 hours later.   Work on inhaler technique:  relax and gently blow all the way out then take a nice smooth full deep breath back in, triggering the inhaler at same time you start breathing in.  Hold breath in for at least  5 seconds if you can. Blow out breztri/symbicort thru nose. Rinse and gargle with water when done.  If mouth or throat bother you at all,  try brushing teeth/gums/tongue with arm and hammer toothpaste/ make a slurry and gargle and spit out.   >>>  Remember how golfers warm up by taking practice swings - do this with an empty inhaler     Plan B = Backup (to supplement plan A, not to replace it) Only use your albuterol inhaler as a rescue medication to be used if you can't catch your breath by resting or doing a relaxed purse lip breathing pattern.  - The less you use it, the better it will work when you need it. - Ok to use the inhaler up to 2 puffs  every 4 hours if you must but call for appointment if use goes up over your usual need - Don't leave home without it !!  (think of it like the spare tire for your car)   Plan B = for cough >>> Mucinex  dm 1200 mg every 12 hours as needed   The key is to stop smoking completely before smoking completely stops you!  Please remember to go to the  x-ray department  for your tests - we will call you with the results when they are available

## 2023-06-09 ENCOUNTER — Telehealth: Payer: Self-pay | Admitting: Internal Medicine

## 2023-06-09 NOTE — Telephone Encounter (Signed)
Pt's sister dropped of paperwork. Placed papers in your box in the front office.

## 2023-06-10 ENCOUNTER — Other Ambulatory Visit: Payer: Self-pay | Admitting: Internal Medicine

## 2023-06-15 ENCOUNTER — Telehealth: Payer: Self-pay | Admitting: Internal Medicine

## 2023-06-15 NOTE — Telephone Encounter (Signed)
Please call daughter w/Results. AVS States:   Please remember to go to the x-ray department for your tests - we will call you with the results when they are available

## 2023-06-16 NOTE — Telephone Encounter (Signed)
Reviewed and completed provider portion of [place] [employer] [place] Farxiga. Provider has reviewed and signed. Faxed application to [place] [employer] today. Will send application to be scanned to patient's chart.

## 2023-06-21 DIAGNOSIS — D485 Neoplasm of uncertain behavior of skin: Secondary | ICD-10-CM | POA: Diagnosis not present

## 2023-06-21 DIAGNOSIS — L720 Epidermal cyst: Secondary | ICD-10-CM | POA: Diagnosis not present

## 2023-06-21 DIAGNOSIS — L400 Psoriasis vulgaris: Secondary | ICD-10-CM | POA: Diagnosis not present

## 2023-06-21 DIAGNOSIS — L723 Sebaceous cyst: Secondary | ICD-10-CM | POA: Diagnosis not present

## 2023-06-22 NOTE — Telephone Encounter (Signed)
Pt calling in for Results of CT scan

## 2023-06-23 NOTE — Telephone Encounter (Signed)
Patient calling again for xray results

## 2023-06-24 NOTE — Telephone Encounter (Signed)
Pt still has not rcvd his X-Ray results

## 2023-06-24 NOTE — Telephone Encounter (Signed)
Spoke to patient. He is aware that CXR has not been read by radiology.  Advised pt that our office would contact him once report has been read by radiology and reviewed by Dr. Sherene Sires.  He voiced his understanding.  Nothing further needed.

## 2023-07-01 ENCOUNTER — Telehealth: Payer: Self-pay | Admitting: Internal Medicine

## 2023-07-01 NOTE — Telephone Encounter (Signed)
PT calling for rad results taken 10/29. Please see if we can call the radiologist and request results be sent STAT. He is getting ready to have surgery. His # [phone]  Please post results to [facility] as well as call PT.

## 2023-07-05 ENCOUNTER — Telehealth: Payer: Self-pay

## 2023-07-05 MED ORDER — BREZTRI AEROSPHERE 160-9-4.8 MCG/ACT IN AERO: 2.0000 | INHALATION_SPRAY | Freq: Two times a day (BID) | RESPIRATORY_TRACT | 12 refills | Status: DC

## 2023-07-05 NOTE — Telephone Encounter (Signed)
See my last ov - I looked at his cxr and cleared hiim from pulmonary perspective only

## 2023-07-05 NOTE — Telephone Encounter (Signed)
Pt returning missed call. 

## 2023-07-05 NOTE — Telephone Encounter (Signed)
Called pt for CXR results, no answer. Lvmm for pt to give the office a call back

## 2023-07-05 NOTE — Telephone Encounter (Signed)
Dr.Wert was this patient cleared for surgery?

## 2023-07-05 NOTE — Patient Outreach (Signed)
  Care Management   Outreach Note  07/05/2023 Name: [name]. MRN: [id number] DOB: 11/15/1960  An unsuccessful telephone outreach was attempted today to contact the patient about Care Management needs.     Follow Up Plan:  A HIPAA compliant phone message was left for the patient providing contact information and requesting a return call.    [facility]  [facility], [facility] RN Care Manager Direct Dial: [phone] Website: [email or web address]

## 2023-07-06 NOTE — Telephone Encounter (Signed)
Called and spoke with the pt  I have faxed his note clearing him from pulm standpoint to [facility]- Dr. Jillyn Hidden  Nothing further needed

## 2023-07-10 ENCOUNTER — Other Ambulatory Visit: Payer: Self-pay | Admitting: Internal Medicine

## 2023-07-19 ENCOUNTER — Ambulatory Visit (INDEPENDENT_AMBULATORY_CARE_PROVIDER_SITE_OTHER): Payer: Medicare HMO | Admitting: Pharmacist

## 2023-07-19 ENCOUNTER — Other Ambulatory Visit: Payer: Self-pay | Admitting: Pharmacist

## 2023-07-19 DIAGNOSIS — J449 Chronic obstructive pulmonary disease, unspecified: Secondary | ICD-10-CM

## 2023-07-19 DIAGNOSIS — Z7984 Long term (current) use of oral hypoglycemic drugs: Secondary | ICD-10-CM

## 2023-07-19 DIAGNOSIS — E785 Hyperlipidemia, unspecified: Secondary | ICD-10-CM

## 2023-07-19 DIAGNOSIS — E1169 Type 2 diabetes mellitus with other specified complication: Secondary | ICD-10-CM

## 2023-07-19 NOTE — Progress Notes (Signed)
Pharmacy Note  07/19/2023 Name: [name]. MRN: [id number] DOB: November 23, 1960  Subjective: [name]. is a 62 y.o. year old male who is a primary care patient of Wanda Plump, MD. Clinical Pharmacist Practitioner referral was placed to assist with medication and CHF and diabetes management.    Engaged with patient today   by phone for medication management follow up   CHF/ HTN:  Current therapy: Farxiga 5mg  daily, valsartan 40mg  daily, spironolactone 25mg  - 0.5 tablet once a day.  Checking weight a few times per week - reports that weight has been stable.  His most recent weight was 212lbs Denies edema.   Wt Readings from Last 3 Encounters:  06/08/23 213 lb 9.6 oz (96.9 kg)  06/07/23 214 lb 2 oz (97.1 kg)  05/14/23 211 lb 6.4 oz (95.9 kg)    Type 2 DM:  Current therapy - taking Farxiga 5mg  daily but is more for CHF.  Patient does not check blood glucose at home.  Hyperlipidemia: last LDL was at goal LDL = 68 and triglycerides = 81 Patient is taking atorvastatin 80mg  daily, ezetimibe 10mg  daily and fenofibrate 160mg  daily.    COPD:  Managed by Dr Sherene Sires.  Current therapy: Breztri 2 inhalations twice a day (patient reports he has been out of [place] for a few days - he has requested from his pharmacy).  Past therapy: Symbicort   Also uses supplemental O2 as needed at night time but he has not needed but a few times in the last month.  Reports he has not used rescue inhaler in the last 4 weeks.   Medication Management:  Patient [place]  [place]ru [place].      SDOH (Social Determinants of Health) assessments and interventions performed:  SDOH Interventions    Flowsheet Row Patient Outreach from 05/11/2023 in [place] POPULATION HEALTH DEPARTMENT Care Coordination from 11/30/2022 in [facility] Community Care Coordination Care Coordination from 11/10/2022 in [facility] Community Care Coordination Chronic  Care Management from 06/09/2022 in [facility] Primary Care at [facility] Chronic Care Management from 03/17/2022 in [facility] Primary Care at [facility] Chronic Care Management from 02/04/2022 in [facility] Primary Care at [facility]  SDOH Interventions        Food Insecurity Interventions Intervention Not Indicated -- Intervention Not Indicated Intervention Not Indicated -- Intervention Not Indicated  Housing Interventions -- -- Intervention Not Indicated Intervention Not Indicated -- Intervention Not Indicated  Transportation Interventions Intervention Not Indicated -- Intervention Not Indicated Intervention Not Indicated -- Intervention Not Indicated  Utilities Interventions -- -- Intervention Not Indicated Intervention Not Indicated -- --  Alcohol Usage Interventions Intervention Not Indicated (Score <7) -- -- -- -- --  Depression Interventions/Treatment  -- Counseling  [will discuss all treatment options] -- -- -- --  Financial Strain Interventions -- -- -- -- -- Intervention Not Indicated  Physical Activity Interventions -- -- -- -- -- Patient Refused  Stress Interventions -- -- Provide Counseling -- -- Intervention Not Indicated  Social Connections Interventions -- -- -- -- Intervention Not Indicated Intervention Not Indicated        Objective: Review of patient status, including review of consultants reports, laboratory and other test data, was performed as part of comprehensive evaluation and provision of chronic care management services.   Lab Results  Component Value Date   CREATININE 1.16 06/07/2023   CREATININE 1.09  12/09/2022   CREATININE 1.10 04/29/2022    Lab Results  Component Value Date   HGBA1C 6.6 (H) 06/07/2023       Component Value Date/Time   CHOL 132 06/07/2023 1411   CHOL 171 11/14/2019 1441   TRIG 81.0 06/07/2023 1411   HDL 47.00 06/07/2023 1411   HDL 37 (L) 11/14/2019 1441   CHOLHDL 3 06/07/2023  1411   VLDL 16.2 06/07/2023 1411   LDLCALC 68 06/07/2023 1411   LDLCALC 116 (H) 11/14/2019 1441   LDLDIRECT 116.0 05/12/2013 1517     Clinical ASCVD: Yes  The ASCVD Risk score (Arnett DK, et al., 2019) failed to calculate for the following reasons:   The patient has a prior MI or stroke diagnosis    BP Readings from Last 3 Encounters:  06/08/23 104/64  06/07/23 126/68  05/14/23 126/60     Allergies  Allergen Reactions   Metformin And Related Diarrhea and Other (See Comments)    Severe diarrhea   Wellbutrin [Bupropion] Nausea Only    Medications Reviewed Today     Reviewed by Henrene Pastor, RPH-CPP (Pharmacist) on 07/19/23 at 1514  Med List Status: <None>   Medication Order Taking? Sig Documenting Provider Last Dose Status Informant  albuterol (VENTOLIN HFA) 108 (90 Base) MCG/ACT inhaler [id number] Yes Inhale 2 puffs into the lungs every 6 (six) hours as needed for wheezing or shortness of breath. Gaston Islam., NP Taking Active   aspirin EC 81 MG EC tablet [id number] Yes Take 1 tablet (81 mg total) by mouth daily with breakfast. Shon Hale, MD Taking Active Multiple Informants  atorvastatin (LIPITOR) 80 MG tablet [id number] Yes Take 1 tablet (80 mg total) by mouth daily. Kathleene Hazel, MD Taking Active   B Complex-C (B-COMPLEX WITH VITAMIN C) tablet [id number] Yes Take 1 tablet by mouth daily. [provider] Taking Active   Budeson-Glycopyrrol-Formoterol (BREZTRI AEROSPHERE) 160-9-4.8 MCG/ACT AERO [id number] Yes Inhale 2 puffs into the lungs in the morning and at bedtime. Nyoka Cowden, MD Taking Active   Cholecalciferol (VITAMIN D-3) 125 MCG (5000 UT) TABS [id number] Yes Take 5,000 Units by mouth daily. [provider] Taking Active Multiple Informants  clopidogrel (PLAVIX) 75 MG tablet [id number] Yes TAKE 1 TABLET EVERY DAY Kathleene Hazel, MD Taking Active   Cyanocobalamin (VITAMIN B-12) 5000 MCG SUBL [id number]  Place 5,000 mcg under  the tongue daily. [provider]  Active Multiple Informants           Med Note (CANTER, Vicente Males D   Wed Dec 09, 2022  2:22 PM) Sometimes  dapagliflozin propanediol (FARXIGA) 5 MG TABS tablet [id number] Yes Take 1 tablet (5 mg total) by mouth daily before breakfast. Wanda Plump, MD Taking Active            Med Note Marius Ditch May 17, 2023  3:08 PM) [place] thru 08/10/2023  ezetimibe (ZETIA) 10 MG tablet [id number] Yes TAKE 1 TABLET EVERY DAY (NEED MD APPOINTMENT FOR REFILLS) Kathleene Hazel, MD Taking Active   fenofibrate (TRICOR) 145 MG tablet [id number] Yes Take 1 tablet (145 mg total) by mouth daily. Wanda Plump, MD Taking Active   guaiFENesin (MUCINEX) 600 MG 12 hr tablet [id number] Yes Take 600 mg by mouth 2 (two) times daily as needed for cough. [provider] Taking Active            Med Note (CANTER, [place] D   Wed Dec 09, 2022  2:23 PM) PRN  isosorbide mononitrate (IMDUR) 60 MG 24 hr tablet [id number] Yes TAKE 1 AND 1/2 TABLETS EVERY DAY Kathleene Hazel, MD Taking Active   oxyCODONE-acetaminophen (PERCOCET) 10-325 MG tablet [id number] Yes Take 2 tablets by mouth every 6 (six) hours as needed for pain. [provider] Taking Active Self           Med Note Loreli Dollar, Raynaldo Opitz Jun 07, 2023  1:30 PM)    OXYGEN [id number] Yes Inhale 2-4 L/min into the lungs continuous. [provider] Taking Active Multiple Informants           Med Note [place], Paula Libra   Tue Jun 08, 2023 11:46 AM)    pantoprazole (PROTONIX) 40 MG tablet [id number] Yes TAKE 1 TABLET TWICE DAILY (NEED MD APPOINTMENT FOR REFILLS) Kathleene Hazel, MD Taking Active   PRESCRIPTION MEDICATION [id number]  CPAP- At bedtime  Patient not taking: Reported on 05/17/2023   [provider]  Active Multiple Informants  tamsulosin (FLOMAX) 0.4 MG CAPS capsule [id number] Yes Take 1 capsule (0.4 mg total) by mouth in the morning and at  bedtime. Wanda Plump, MD Taking Active   traZODone (DESYREL) 100 MG tablet [id number] Yes Take 2 tablets (200 mg total) by mouth at bedtime. Wanda Plump, MD Taking Active   valsartan (DIOVAN) 40 MG tablet [id number] Yes Take 1 tablet (40 mg total) by mouth daily. Wanda Plump, MD Taking Active             Patient Active Problem List   Diagnosis Date Noted   Palliative care encounter 04/13/2022   COPD (chronic obstructive pulmonary disease) (HCC) 04/07/2022   Class 1 obesity 12/25/2021   Acute on chronic diastolic heart failure (HCC) 12/23/2021   Chronic respiratory failure with hypoxia (HCC) 12/23/2021   COPD exacerbation (HCC) 12/23/2021   Alcohol use 12/23/2021   (HFpEF) heart failure with preserved ejection fraction (HCC)    DOE (dyspnea on exertion) 11/07/2020   Type 2 diabetes mellitus with hyperlipidemia (HCC) 03/18/2020   Prolapsed lumbar disc 01/25/2020   Hiatal hernia    Hepatic steatosis    Hemianopia, homonymous, right    ETOH abuse    Esophagitis    Chronic diastolic CHF (congestive heart failure) (HCC)    Chronic bronchitis (HCC)    CAD (coronary artery disease)    PAD (peripheral artery disease) (HCC) 09/23/2016   Morbid obesity (HCC)    Chronic pain syndrome 10/23/2015   GERD (gastroesophageal reflux disease) 08/07/2015   PCP NOTES >>>>> 06/01/2015   Annual physical exam 05/31/2015   Acute ischemic stroke (HCC) 02/20/2015   BPH (benign prostatic hyperplasia) 02/20/2015   OSA 05/05/2013   Erectile dysfunction 03/17/2012   Emphysema (subcutaneous) (surgical) resulting from a procedure 10/08/2008   Iron deficiency anemia 08/10/2008   ABNORMALITY OF GAIT 08/06/2008   Cigarette smoker 07/27/2008   Essential hypertension 07/26/2008   ST elevation myocardial infarction involving left circumflex coronary artery (HCC) 07/26/2008     Medication Assistance:  [name] obtained through [facility] and Me medication assistance program.  Enrollment ends 08/09/2024 .     Assessment / Plan:  CHF - stable Continue Farxiga and valsartan Recommended he weigh daily - report weight gain of more than 3 lbs in 24 hours or 5 lbs in 1 week.   Type 2 DM - at goal, [place] mostly for CHF Continue Farxiga 5mg  daily Ok to not check blood glucose at home at this  time. IF A1c increases to > 7% would consider prescribing home glucometer.   Hyperlipidemia - at goal of LDL < 70. Continue ezetimibe, atorvastatin 80mg  daily and fenofibrate.   COPD - no recent exacerbations:   Reviewed when to use albuterol inhaler - for shortness of breath or wheezing.  Sent message to pulmonology office requesting updated Rx for Breztri to be sent to patient's pharmacy (looks like last Rx was still noted as a sample and not sent to his pharmacy)   Med Management:  Reviewed Rx and over-the-counter meds. Updated med list.  Reviewed refill history and adherence.  Called [place] / [name] and confirmed that he was been re-enrolled thru 08/09/2024. There is an active Rx with refills at [facility].  Will also see about adding Breztri to his [place] - will need to complete new application for Breztri. a    Follow Up:  Telephone follow up appointment with care management team member scheduled for:  1 to 2 months     Henrene Pastor, PharmD Clinical Pharmacist [facility] Primary Care  - [facility] [phone]

## 2023-07-19 NOTE — Telephone Encounter (Signed)
Spoke with patient today. He has not received Rx for [facility] yet. It looks like it might have been done as a sample instead of sent as an E-Rx to his pharmacy. Will reach out to pulmonology office to resend.

## 2023-07-22 DIAGNOSIS — M5451 Vertebrogenic low back pain: Secondary | ICD-10-CM | POA: Diagnosis not present

## 2023-07-22 DIAGNOSIS — M5459 Other low back pain: Secondary | ICD-10-CM | POA: Diagnosis not present

## 2023-07-26 ENCOUNTER — Ambulatory Visit (INDEPENDENT_AMBULATORY_CARE_PROVIDER_SITE_OTHER): Payer: Medicare HMO | Admitting: Pharmacist

## 2023-07-26 DIAGNOSIS — J449 Chronic obstructive pulmonary disease, unspecified: Secondary | ICD-10-CM

## 2023-07-26 DIAGNOSIS — F17200 Nicotine dependence, unspecified, uncomplicated: Secondary | ICD-10-CM

## 2023-07-26 NOTE — Progress Notes (Signed)
Pharmacy Note  07/19/2023 Name: [name]. MRN: [id number] DOB: 11-16-1960  Subjective: [name]. is a 62 y.o. year old male who is a primary care patient of Wanda Plump, MD. Clinical Pharmacist Practitioner referral was placed to assist with medication management. Patient reports today that he is hoping to be scheduled for back surgery in 2025 and surgeon has recommended he stop smoking. He is interesting in pharmacotherapy to help with smoking cessation.   Engaged with patient today   by phone for medication management follow up / smoking cessation.   CHF/ HTN:  Current therapy: Farxiga 5mg  daily, valsartan 40mg  daily, spironolactone 25mg  - 0.5 tablet once a day.  Denies edema.   Wt Readings from Last 3 Encounters:  06/08/23 213 lb 9.6 oz (96.9 kg)  06/07/23 214 lb 2 oz (97.1 kg)  05/14/23 211 lb 6.4 oz (95.9 kg)    Type 2 DM:  Current therapy - taking Farxiga 5mg  daily but is more for CHF.  Patient does not check blood glucose at home.  Hyperlipidemia: last LDL was at goal LDL = 68 and triglycerides = 81 Patient is taking atorvastatin 80mg  daily, ezetimibe 10mg  daily and fenofibrate 160mg  daily.    COPD:  Managed by Dr Sherene Sires.  Current therapy: Breztri 2 inhalations twice a day (patient reports he has been out of [place] and has not been able to get from pulmonology office. I sent request 07/19/2023)   Past therapy: Symbicort   Also uses supplemental O2 as needed at night time but he has not needed but a few times in the last month.  Reports he has not used rescue inhaler in the last 4 weeks.   Tobacco Abuse:  Tobacco Use History: Age when started using tobacco on a daily basis: 62 years old Started at [age] years old Number of cigarettes per day 1.5 ppd Smokes first cigarette 5 minutes after waking Does wake at night to smoke Triggers include: HABITS - getting update night to use bathroom, eating  Quit Attempt History: Most recent quit attempt  about 1 year ago Longest time ever been tobacco free 2 months (after his open heart surgery - quit cold [place]) Methods tried in the past include  nicotine lozenges, patches; Chantix (not very helpful) and Zyban (caused nausea) .  Rates IMPORTANCE of quitting tobacco on 1-10 scale of 10. Rates READINESS of quitting tobacco on 1-10 scale of 10. Rates CONFIDENCE of quitting tobacco on 1-10 scale of 3. Motivators to quitting include surgery (back) ; barriers include fear of failing again, friends smoke, and under a lot of stress now   Current medication access support: none   Objective:  Lab Results  Component Value Date   HGBA1C 6.6 (H) 06/07/2023    Lab Results  Component Value Date   CREATININE 1.16 06/07/2023   BUN 16 06/07/2023   NA 137 06/07/2023   K 4.4 06/07/2023   CL 100 06/07/2023   CO2 29 06/07/2023    Lab Results  Component Value Date   CHOL 132 06/07/2023   HDL 47.00 06/07/2023   LDLCALC 68 06/07/2023   LDLDIRECT 116.0 05/12/2013   TRIG 81.0 06/07/2023   CHOLHDL 3 06/07/2023    Medications Reviewed Today     Reviewed by Henrene Pastor, RPH-CPP (Pharmacist) on 07/26/23 at 1247  Med List Status: <None>   Medication Order Taking? Sig Documenting Provider Last Dose Status Informant  albuterol (VENTOLIN HFA) 108 (90 Base) MCG/ACT inhaler  [id number] Yes Inhale 2 puffs into the lungs every 6 (six) hours as needed for wheezing or shortness of breath. Gaston Islam., NP Taking Active   aspirin EC 81 MG EC tablet [id number] Yes Take 1 tablet (81 mg total) by mouth daily with breakfast. Shon Hale, MD Taking Active Multiple Informants  atorvastatin (LIPITOR) 80 MG tablet [id number] Yes Take 1 tablet (80 mg total) by mouth daily. Kathleene Hazel, MD Taking Active   B Complex-C (B-COMPLEX WITH VITAMIN C) tablet [id number] Yes Take 1 tablet by mouth daily. [provider] Taking Active   Budeson-Glycopyrrol-Formoterol (BREZTRI AEROSPHERE) 160-9-4.8  MCG/ACT AERO [id number] Yes Inhale 2 puffs into the lungs in the morning and at bedtime. Nyoka Cowden, MD Taking Active   Cholecalciferol (VITAMIN D-3) 125 MCG (5000 UT) TABS [id number] Yes Take 5,000 Units by mouth daily. [provider] Taking Active Multiple Informants  clopidogrel (PLAVIX) 75 MG tablet [id number] Yes TAKE 1 TABLET EVERY DAY Kathleene Hazel, MD Taking Active   Cyanocobalamin (VITAMIN B-12) 5000 MCG SUBL [id number] Yes Place 5,000 mcg under the tongue daily. [provider] Taking Active Multiple Informants           Med Note (CANTER, Vicente Males D   Wed Dec 09, 2022  2:22 PM) Sometimes  dapagliflozin propanediol (FARXIGA) 5 MG TABS tablet [id number] Yes Take 1 tablet (5 mg total) by mouth daily before breakfast. Wanda Plump, MD Taking Active            Med Note Marius Ditch May 17, 2023  3:08 PM) [place] thru 08/10/2023  ezetimibe (ZETIA) 10 MG tablet [id number] Yes TAKE 1 TABLET EVERY DAY (NEED MD APPOINTMENT FOR REFILLS) Kathleene Hazel, MD Taking Active   fenofibrate (TRICOR) 145 MG tablet [id number] Yes Take 1 tablet (145 mg total) by mouth daily. Wanda Plump, MD Taking Active   guaiFENesin (MUCINEX) 600 MG 12 hr tablet [id number] Yes Take 600 mg by mouth 2 (two) times daily as needed for cough. [provider] Taking Active            Med Note (CANTER, Vicente Males D   Wed Dec 09, 2022  2:23 PM) PRN  isosorbide mononitrate (IMDUR) 60 MG 24 hr tablet [id number] Yes TAKE 1 AND 1/2 TABLETS EVERY DAY Kathleene Hazel, MD Taking Active   methocarbamol (ROBAXIN) 500 MG tablet [id number] Yes Take by mouth daily as needed for muscle spasms. [provider] Taking Active   oxyCODONE-acetaminophen (PERCOCET) 10-325 MG tablet [id number] Yes Take 2 tablets by mouth every 6 (six) hours as needed for pain. [provider] Taking Active Self           Med Note Loreli Dollar, Raynaldo Opitz Jun 07, 2023  1:30  PM)    OXYGEN [id number] Yes Inhale 2-4 L/min into the lungs continuous. [provider] Taking Active Multiple Informants           Med Note Loreli Dollar, Paula Libra   Tue Jun 08, 2023 11:46 AM)    pantoprazole (PROTONIX) 40 MG tablet [id number] Yes TAKE 1 TABLET TWICE DAILY (NEED MD APPOINTMENT FOR REFILLS) Kathleene Hazel, MD Taking Active   PRESCRIPTION MEDICATION [id number] Yes CPAP- At bedtime [provider] Taking Active Multiple Informants  spironolactone (ALDACTONE) 25 MG tablet [id number] Yes Take 12.5 mg by mouth once. [provider] Taking Active   tamsulosin (FLOMAX) 0.4 MG CAPS capsule [id number] Yes Take  1 capsule (0.4 mg total) by mouth in the morning and at bedtime. Wanda Plump, MD Taking Active   traZODone (DESYREL) 100 MG tablet [id number] Yes Take 2 tablets (200 mg total) by mouth at bedtime. Wanda Plump, MD Taking Active   valsartan (DIOVAN) 40 MG tablet [id number] Yes Take 1 tablet (40 mg total) by mouth daily. Wanda Plump, MD Taking Active               Assessment/Plan:   Hypertension / CHF:  - Currently controlled - Recommend to continue Farxiga, spironolactone and valsartan   Hyperlipidemia/ASCVD Risk Reduction: - Currently controlled.  - Recommend to continue atorvastatin and fenofibrate   COPD: - Currently controlled.  - Reviewed appropriate inhaler technique. - Recommend to continue Breztri -   - Meets financial criteria for [employer] patient assistance program through [place] [employer] and Me Program. Completed provider portion of application today and will forward to PCP to review and sign.     Tobacco Abuse - Currently uncontrolled - Provided motivational interviewing to assess tobacco use and strategies for reduction - Provided information on 1 800 QUIT NOW support program - Start varenicline 0.5 mg daily for 3 days, then 0.5 mg twice daily for 4 days, then 1 mg twice daily thereafter. Counseled to take with food to reduce risk of GI  adverse effects. Also counseled to stop therapy and contact provider if change in mood.  - Will also send in prescription for nicotine replacement inhalers but not sure that insurance will cover - patient is aware.   Meds ordered this encounter  Medications   Varenicline Tartrate, Starter, (CHANTIX STARTING MONTH PAK) 0.5 MG X 11 & 1 MG X 42 TBPK    Sig: Take according to starter pack directions *take with food*    Dispense:  1 each    Refill:  0   nicotine (NICOTROL) 10 MG inhaler    Sig: Take short and shallow puffs from the inhaler as needed for cravings.    Dispense:  42 each    Refill:  0    Follow Up Plan: 1 to 2 months  Henrene Pastor, PharmD Clinical Pharmacist [facility]        SDOH (Social Determinants of Health) assessments and interventions performed:  SDOH Interventions    Flowsheet Row Patient Outreach from 05/11/2023 in [facility] Care Coordination from 11/30/2022 in [facility] Care Coordination Care Coordination from 11/10/2022 in [facility] Community Care Coordination Chronic Care Management from 06/09/2022 in [facility] Primary Care at [facility] Chronic Care Management from 03/17/2022 in [facility] Primary Care at [facility] Chronic Care Management from 02/04/2022 in [facility] Primary Care at [facility]  SDOH Interventions        Food Insecurity Interventions Intervention Not Indicated -- Intervention Not Indicated Intervention Not Indicated -- Intervention Not Indicated  Housing Interventions -- -- Intervention Not Indicated Intervention Not Indicated -- Intervention Not Indicated  Transportation Interventions Intervention Not Indicated -- Intervention Not Indicated Intervention Not Indicated -- Intervention Not Indicated  Utilities Interventions -- -- Intervention Not Indicated Intervention Not Indicated -- --   Alcohol Usage Interventions Intervention Not Indicated (Score <7) -- -- -- -- --  Depression Interventions/Treatment  -- Counseling  [will discuss all treatment options] -- -- -- --  Financial Strain Interventions -- -- -- -- -- Intervention Not Indicated  Physical Activity Interventions -- -- -- -- --  Patient Refused  Stress Interventions -- -- Provide Counseling -- -- Intervention Not Indicated  Social Connections Interventions -- -- -- -- Intervention Not Indicated Intervention Not Indicated        Objective: Review of patient status, including review of consultants reports, laboratory and other test data, was performed as part of comprehensive evaluation and provision of chronic care management services.   Lab Results  Component Value Date   CREATININE 1.16 06/07/2023   CREATININE 1.09 12/09/2022   CREATININE 1.10 04/29/2022    Lab Results  Component Value Date   HGBA1C 6.6 (H) 06/07/2023       Component Value Date/Time   CHOL 132 06/07/2023 1411   CHOL 171 11/14/2019 1441   TRIG 81.0 06/07/2023 1411   HDL 47.00 06/07/2023 1411   HDL 37 (L) 11/14/2019 1441   CHOLHDL 3 06/07/2023 1411   VLDL 16.2 06/07/2023 1411   LDLCALC 68 06/07/2023 1411   LDLCALC 116 (H) 11/14/2019 1441   LDLDIRECT 116.0 05/12/2013 1517     Clinical ASCVD: Yes  The ASCVD Risk score (Arnett DK, et al., 2019) failed to calculate for the following reasons:   Risk score cannot be calculated because patient has a medical history suggesting prior/existing ASCVD    BP Readings from Last 3 Encounters:  06/08/23 104/64  06/07/23 126/68  05/14/23 126/60     Allergies  Allergen Reactions   Metformin And Related Diarrhea and Other (See Comments)    Severe diarrhea   Wellbutrin [Bupropion] Nausea Only    Medications Reviewed Today     Reviewed by Henrene Pastor, RPH-CPP (Pharmacist) on 07/26/23 at 1247  Med List Status: <None>   Medication Order Taking? Sig Documenting Provider Last Dose  Status Informant  albuterol (VENTOLIN HFA) 108 (90 Base) MCG/ACT inhaler [id number] Yes Inhale 2 puffs into the lungs every 6 (six) hours as needed for wheezing or shortness of breath. Gaston Islam., NP Taking Active   aspirin EC 81 MG EC tablet [id number] Yes Take 1 tablet (81 mg total) by mouth daily with breakfast. Shon Hale, MD Taking Active Multiple Informants  atorvastatin (LIPITOR) 80 MG tablet [id number] Yes Take 1 tablet (80 mg total) by mouth daily. Kathleene Hazel, MD Taking Active   B Complex-C (B-COMPLEX WITH VITAMIN C) tablet [id number] Yes Take 1 tablet by mouth daily. [provider] Taking Active   Budeson-Glycopyrrol-Formoterol (BREZTRI AEROSPHERE) 160-9-4.8 MCG/ACT AERO [id number] Yes Inhale 2 puffs into the lungs in the morning and at bedtime. Nyoka Cowden, MD Taking Active   Cholecalciferol (VITAMIN D-3) 125 MCG (5000 UT) TABS [id number] Yes Take 5,000 Units by mouth daily. [provider] Taking Active Multiple Informants  clopidogrel (PLAVIX) 75 MG tablet [id number] Yes TAKE 1 TABLET EVERY DAY Kathleene Hazel, MD Taking Active   Cyanocobalamin (VITAMIN B-12) 5000 MCG SUBL [id number] Yes Place 5,000 mcg under the tongue daily. [provider] Taking Active Multiple Informants           Med Note (CANTER, Vicente Males D   Wed Dec 09, 2022  2:22 PM) Sometimes  dapagliflozin propanediol (FARXIGA) 5 MG TABS tablet [id number] Yes Take 1 tablet (5 mg total) by mouth daily before breakfast. Wanda Plump, MD Taking Active            Med Note Marius Ditch May 17, 2023  3:08 PM) [place] patient [place] thru 08/10/2023  ezetimibe (ZETIA) 10 MG tablet [id number] Yes TAKE 1 TABLET EVERY  DAY (NEED MD APPOINTMENT FOR REFILLS) Kathleene Hazel, MD Taking Active   fenofibrate (TRICOR) 145 MG tablet [id number] Yes Take 1 tablet (145 mg total) by mouth daily. Wanda Plump, MD Taking Active   guaiFENesin (MUCINEX) 600 MG 12 hr tablet  [id number] Yes Take 600 mg by mouth 2 (two) times daily as needed for cough. [provider] Taking Active            Med Note (CANTER, Vicente Males D   Wed Dec 09, 2022  2:23 PM) PRN  isosorbide mononitrate (IMDUR) 60 MG 24 hr tablet [id number] Yes TAKE 1 AND 1/2 TABLETS EVERY DAY Kathleene Hazel, MD Taking Active   methocarbamol (ROBAXIN) 500 MG tablet [id number] Yes Take by mouth daily as needed for muscle spasms. [provider] Taking Active   oxyCODONE-acetaminophen (PERCOCET) 10-325 MG tablet [id number] Yes Take 2 tablets by mouth every 6 (six) hours as needed for pain. [provider] Taking Active Self           Med Note Loreli Dollar, Raynaldo Opitz Jun 07, 2023  1:30 PM)    OXYGEN [id number] Yes Inhale 2-4 L/min into the lungs continuous. [provider] Taking Active Multiple Informants           Med Note Loreli Dollar, Paula Libra   Tue Jun 08, 2023 11:46 AM)    pantoprazole (PROTONIX) 40 MG tablet [id number] Yes TAKE 1 TABLET TWICE DAILY (NEED MD APPOINTMENT FOR REFILLS) Kathleene Hazel, MD Taking Active   PRESCRIPTION MEDICATION [id number] Yes CPAP- At bedtime [provider] Taking Active Multiple Informants  spironolactone (ALDACTONE) 25 MG tablet [id number] Yes Take 12.5 mg by mouth once. [provider] Taking Active   tamsulosin (FLOMAX) 0.4 MG CAPS capsule [id number] Yes Take 1 capsule (0.4 mg total) by mouth in the morning and at bedtime. Wanda Plump, MD Taking Active   traZODone (DESYREL) 100 MG tablet [id number] Yes Take 2 tablets (200 mg total) by mouth at bedtime. Wanda Plump, MD Taking Active   valsartan (DIOVAN) 40 MG tablet [id number] Yes Take 1 tablet (40 mg total) by mouth daily. Wanda Plump, MD Taking Active             Patient Active Problem List   Diagnosis Date Noted   Palliative care encounter 04/13/2022   COPD (chronic obstructive pulmonary disease) (HCC) 04/07/2022   Class 1 obesity 12/25/2021   Acute on chronic  diastolic heart failure (HCC) 12/23/2021   Chronic respiratory failure with hypoxia (HCC) 12/23/2021   COPD exacerbation (HCC) 12/23/2021   Alcohol use 12/23/2021   (HFpEF) heart failure with preserved ejection fraction (HCC)    DOE (dyspnea on exertion) 11/07/2020   Type 2 diabetes mellitus with hyperlipidemia (HCC) 03/18/2020   Prolapsed lumbar disc 01/25/2020   Hiatal hernia    Hepatic steatosis    Hemianopia, homonymous, right    ETOH abuse    Esophagitis    Chronic diastolic CHF (congestive heart failure) (HCC)    Chronic bronchitis (HCC)    CAD (coronary artery disease)    PAD (peripheral artery disease) (HCC) 09/23/2016   Morbid obesity (HCC)    Chronic pain syndrome 10/23/2015   GERD (gastroesophageal reflux disease) 08/07/2015   PCP NOTES >>>>> 06/01/2015   Annual physical exam 05/31/2015   Acute ischemic stroke (HCC) 02/20/2015   BPH (benign prostatic hyperplasia) 02/20/2015   OSA 05/05/2013   Erectile dysfunction 03/17/2012   Emphysema (subcutaneous) (surgical) resulting from a procedure  10/08/2008   Iron deficiency anemia 08/10/2008   ABNORMALITY OF GAIT 08/06/2008   Cigarette smoker 07/27/2008   Essential hypertension 07/26/2008   ST elevation myocardial infarction involving left circumflex coronary artery (HCC) 07/26/2008     Medication Assistance:  [name] obtained through [facility] and Me medication assistance program.  Enrollment ends 08/09/2024 .    Assessment / Plan:  CHF - stable Continue Farxiga and valsartan Recommended he weigh daily - report weight gain of more than 3 lbs in 24 hours or 5 lbs in 1 week.   Type 2 DM - at goal, [place] mostly for CHF Continue Farxiga 5mg  daily Ok to not check blood glucose at home at this time. IF A1c increases to > 7% would consider prescribing home glucometer.   Hyperlipidemia - at goal of LDL < 70. Continue ezetimibe, atorvastatin 80mg  daily and fenofibrate.   COPD - no recent exacerbations:   Reviewed when to  use albuterol inhaler - for shortness of breath or wheezing.  Sent message to pulmonology office requesting updated Rx for Breztri to be sent to patient's pharmacy (looks like last Rx was still noted as a sample and not sent to his pharmacy)   Med Management:  Reviewed Rx and over-the-counter meds. Updated med list.  Reviewed refill history and adherence.  Called [place] / [name] and confirmed that he was been re-enrolled thru 08/09/2024. There is an active Rx with refills at [facility].  Will also see about adding Breztri to his [place] program - will need to complete new application for Breztri. a    Follow Up:  Telephone follow up appointment with care management team member scheduled for:  1 to 2 months     Henrene Pastor, PharmD Clinical Pharmacist [facility]  - [facility] [phone]

## 2023-07-30 MED ORDER — BREZTRI AEROSPHERE 160-9-4.8 MCG/ACT IN AERO: 2.0000 | INHALATION_SPRAY | Freq: Two times a day (BID) | RESPIRATORY_TRACT | 11 refills | Status: DC

## 2023-07-30 MED ORDER — NICOTINE 10 MG IN INHA: RESPIRATORY_TRACT | 0 refills | Status: DC

## 2023-07-30 MED ORDER — VARENICLINE TARTRATE (STARTER) 0.5 MG X 11 & 1 MG X 42 PO TBPK: ORAL_TABLET | ORAL | 0 refills | Status: DC

## 2023-08-03 ENCOUNTER — Other Ambulatory Visit: Payer: Self-pay | Admitting: Pharmacist

## 2023-08-03 MED ORDER — BREZTRI AEROSPHERE 160-9-4.8 MCG/ACT IN AERO: 2.0000 | INHALATION_SPRAY | Freq: Two times a day (BID) | RESPIRATORY_TRACT | 3 refills | Status: DC

## 2023-08-03 NOTE — Telephone Encounter (Signed)
Received request from [facility] and Me for prescription for [facility] for medication assistance program.  Rx sent to their filling pharmacy - [facility].

## 2023-08-07 ENCOUNTER — Other Ambulatory Visit: Payer: Self-pay | Admitting: Internal Medicine

## 2023-08-15 DIAGNOSIS — M5459 Other low back pain: Secondary | ICD-10-CM | POA: Diagnosis not present

## 2023-08-16 ENCOUNTER — Telehealth: Payer: Self-pay

## 2023-08-16 MED ORDER — NICOTINE 10 MG IN INHA: RESPIRATORY_TRACT | 1 refills | Status: DC

## 2023-08-16 NOTE — Telephone Encounter (Signed)
 Copied from CRM [id number]. Topic: Clinical - Medication Refill >> Aug 16, 2023  4:03 PM Kinnie DEL wrote: Most Recent Primary Care Visit:  Provider: CARLA MILLING  Department: [facility]  Visit Type: TELEPHONE OFFICE VISIT  Date: 07/26/2023  Medication: nicotine  (NICOTROL ) 10 MG inhaler   Has the patient contacted their pharmacy? Yes (Agent: If no, request that the patient contact the pharmacy for the refill. If patient does not wish to contact the pharmacy document the reason why and proceed with request.) (Agent: If yes, when and what did the pharmacy advise?)  Is this the correct pharmacy for this prescription? Yes If no, delete pharmacy and type the correct one.  This is the patient's preferred pharmacy:  [id number] - [place], [place] - [address] [place] [address]   [facility] - [address] [place], [place] - [address] [place] [address] Phone: [phone] Fax: [phone]  [facility] - [place], [place] - [address] [place] [address] Phone: [phone] Fax: [phone]  [facility] - [place], [place] - [address] [place] [address] [place] [address] Phone: [phone] Fax: [phone]   Has the prescription been filled recently? Yes  Is the patient out of the medication? Yes  Has the patient been seen for an appointment in the last year OR does the patient have an upcoming appointment? Yes  Can we respond through MyChart? No  Agent: Please be advised that Rx refills may take up to 3 business days. We ask that you follow-up with your pharmacy.

## 2023-08-16 NOTE — Telephone Encounter (Signed)
 Rx sent

## 2023-08-17 ENCOUNTER — Telehealth: Payer: Self-pay | Admitting: Internal Medicine

## 2023-08-17 ENCOUNTER — Other Ambulatory Visit: Payer: Self-pay | Admitting: Pharmacist

## 2023-08-17 MED ORDER — NICOTINE 21 MG/24HR TD PT24: 21.0000 mg | MEDICATED_PATCH | Freq: Every day | TRANSDERMAL | Status: DC

## 2023-08-17 NOTE — Progress Notes (Signed)
 Pharmacy Note  07/19/2023 Name: [name]. MRN: [id number] DOB: July 27, 1961  Subjective: [name]. is a 63 y.o. year old male who is a primary care patient of Amon Aloysius BRAVO, MD. Clinical Pharmacist Practitioner referral was placed to assist with medication management. Patient reports today that he is hoping to be scheduled for back surgery in 2025 and surgeon has recommended he stop smoking. He is interesting in pharmacotherapy to help with smoking cessation. He was prescribed Nicotrol  inhalers and Chantix  07/2023 but he has not started either prescription yet. He has Chantix  but Nicotrol  inhalers were > $500.   Engaged with patient today   by phone for medication management follow up / smoking cessation.   CHF/ HTN:  Current therapy: Farxiga  5mg  daily, valsartan  40mg  daily, spironolactone  25mg  - 0.5 tablet once a day.  Denies edema.   Wt Readings from Last 3 Encounters:  06/08/23 213 lb 9.6 oz (96.9 kg)  06/07/23 214 lb 2 oz (97.1 kg)  05/14/23 211 lb 6.4 oz (95.9 kg)    Type 2 DM:  Current therapy - taking Farxiga  5mg  daily but is more for CHF.  Patient does not check blood glucose at home.  Hyperlipidemia: last LDL was at goal LDL = 68 and triglycerides = 81 Patient is taking atorvastatin  80mg  daily, ezetimibe  10mg  daily and fenofibrate  160mg  daily.    COPD:  Managed by Dr Darlean.  Current therapy: Breztri  2 inhalations twice a day    Past therapy: Symbicort    Also uses supplemental O2 as needed at night time but he has not needed but a few times in the last month.  Reports he has not used rescue inhaler in the last 4 weeks.   Tobacco Abuse:  Tobacco Use History: Age when started using tobacco on a daily basis: 63 years old Started at 26 or 63 years old Number of cigarettes per day 1.5 ppd Smokes first cigarette 5 minutes after waking Does wake at night to smoke Triggers include: HABITS - getting update night to use bathroom, eating  Quit Attempt  History: Most recent quit attempt about 1 year ago Longest time ever been tobacco free 2 months (after his open heart surgery - quit cold turkey) Methods tried in the past include  nicotine  lozenges, patches; Chantix  (not very helpful) and Zyban  (caused nausea) .  Rates IMPORTANCE of quitting tobacco on 1-10 scale of 10. Rates READINESS of quitting tobacco on 1-10 scale of 10. Rates CONFIDENCE of quitting tobacco on 1-10 scale of 3. Motivators to quitting include surgery (back) ; barriers include fear of failing again, friends smoke, and under a lot of stress now    Objective:  Lab Results  Component Value Date   HGBA1C 6.6 (H) 06/07/2023    Lab Results  Component Value Date   CREATININE 1.16 06/07/2023   BUN 16 06/07/2023   NA 137 06/07/2023   K 4.4 06/07/2023   CL 100 06/07/2023   CO2 29 06/07/2023    Lab Results  Component Value Date   CHOL 132 06/07/2023   HDL 47.00 06/07/2023   LDLCALC 68 06/07/2023   LDLDIRECT 116.0 05/12/2013   TRIG 81.0 06/07/2023   CHOLHDL 3 06/07/2023    Medications Reviewed Today     Reviewed by Carla Milling, RPH-CPP (Pharmacist) on 08/17/23 at 1154  Med List Status: <None>   Medication Order Taking? Sig Documenting Provider Last Dose Status Informant  albuterol  (VENTOLIN  HFA) 108 (90 Base) MCG/ACT inhaler [id number] No  Inhale 2 puffs into the lungs every 6 (six) hours as needed for wheezing or shortness of breath. Wyn Jackee VEAR [name]., NP Taking Active   aspirin  EC 81 MG EC tablet [id number] No Take 1 tablet (81 mg total) by mouth daily with breakfast. Pearlean Manus, MD Taking Active Multiple Informants  atorvastatin  (LIPITOR ) 80 MG tablet [id number] No Take 1 tablet (80 mg total) by mouth daily. Verlin Lonni BIRCH, MD Taking Active   B Complex-C (B-COMPLEX WITH VITAMIN C) tablet [id number] No Take 1 tablet by mouth daily. [provider] Taking Active   Budeson-Glycopyrrol-Formoterol  (BREZTRI  AEROSPHERE) 160-9-4.8 MCG/ACT AERO  [id number]  Inhale 2 puffs into the lungs in the morning and at bedtime. Amon Aloysius BRAVO, MD  Active            Med Note [facility], [place]   Tue Aug 17, 2023 11:54 AM) [place] thru 08/09/2024  Cholecalciferol  (VITAMIN D -3) 125 MCG (5000 UT) TABS [id number] No Take 5,000 Units by mouth daily. [provider] Taking Active Multiple Informants  clopidogrel  (PLAVIX ) 75 MG tablet [id number] No TAKE 1 TABLET EVERY DAY Verlin Lonni BIRCH, MD Taking Active   Cyanocobalamin  (VITAMIN B-12) 5000 MCG SUBL [id number] No Place 5,000 mcg under the tongue daily. [provider] Taking Active Multiple Informants           Med Note (CANTER, KAYLYN D   Wed Dec 09, 2022  2:22 PM) Sometimes  dapagliflozin  propanediol (FARXIGA ) 5 MG TABS tablet [id number] No Take 1 tablet (5 mg total) by mouth daily before breakfast. [name], Jose E, MD Taking Active            Med Note [facility], [place]   Tue Aug 17, 2023 11:54 AM) [place] thru 08/09/2024  ezetimibe  (ZETIA ) 10 MG tablet [id number] No TAKE 1 TABLET EVERY DAY (NEED MD APPOINTMENT FOR REFILLS) Verlin Lonni BIRCH, MD Taking Active   fenofibrate  (TRICOR ) 145 MG tablet [id number] No Take 1 tablet (145 mg total) by mouth daily. [name], Jose E, MD Taking Active   guaiFENesin  (MUCINEX ) 600 MG 12 hr tablet [id number] No Take 600 mg by mouth 2 (two) times daily as needed for cough. [provider] Taking Active            Med Note LAZARO, KAYLYN D   Wed Dec 09, 2022  2:23 PM) PRN  isosorbide  mononitrate (IMDUR ) 60 MG 24 hr tablet [id number] No TAKE 1 AND 1/2 TABLETS EVERY DAY Verlin Lonni BIRCH, MD Taking Active   methocarbamol  (ROBAXIN ) 500 MG tablet [id number] No Take by mouth daily as needed for muscle spasms. [provider] Taking Active   nicotine  (NICOTROL ) 10 MG inhaler [id number]  Take short and shallow puffs from the inhaler as needed for cravings. [name], Jose E, MD  Active    oxyCODONE -acetaminophen  (PERCOCET) 10-325 MG tablet [id number] No Take 2 tablets by mouth every 6 (six) hours as needed for pain. [provider] Taking Active Self           Med Note LAZARO, LILA BIRCH Kitchens Jun 07, 2023  1:30 PM)    OXYGEN  [id number] No Inhale 2-4 L/min into the lungs continuous. [provider] Taking Active Multiple Informants           Med Note (CANTER, KAYLYN D   Tue Jun 08, 2023 11:46 AM)    pantoprazole  (PROTONIX ) 40 MG tablet [id number] No TAKE 1 TABLET TWICE DAILY (NEED MD  APPOINTMENT FOR REFILLS) Verlin Lonni BIRCH, MD Taking Active   PRESCRIPTION MEDICATION [id number] No CPAP- At bedtime [provider] Taking Active Multiple Informants  spironolactone  (ALDACTONE ) 25 MG tablet [id number] No Take 12.5 mg by mouth once. [provider] Taking Active   tamsulosin  (FLOMAX ) 0.4 MG CAPS capsule [id number] No Take 1 capsule (0.4 mg total) by mouth in the morning and at bedtime. Amon Aloysius BRAVO, MD Taking Active   traZODone  (DESYREL ) 100 MG tablet [id number]  Take 2 tablets (200 mg total) by mouth at bedtime. Amon Aloysius BRAVO, MD  Active   valsartan  (DIOVAN ) 40 MG tablet [id number] No Take 1 tablet (40 mg total) by mouth daily. Amon Aloysius BRAVO, MD Taking Active   Varenicline  Tartrate, Starter, (CHANTIX  STARTING MONTH PAK) 0.5 MG X 11 & 1 MG X 42 TBPK [id number]  Take according to starter pack directions *take with foodDEWAINE Amon, Aloysius BRAVO, MD  Active               Assessment/Plan:   Hypertension / CHF:  - Currently controlled - Recommend to continue Farxiga , spironolactone  and valsartan  - [place]iga  form [place] 08[place]09/2024  Hyperlipidemia/ASCVD Risk Reduction: - Currently controlled.  - Recommend to continue atorvastatin  and fenofibrate    COPD: - Currently controlled.  - Reviewed appropriate inhaler technique. - Recommend to continue Breztri  -  [place] check [place] initial refill and delivery. Breztri  was filled  08/09/2023. Tracking Number [id number]. Per tracking number Breztri  has been shipping via [employer] (see above information from [employer]) and should be received sometime today. - Meets financial criteria for Breztri  patient assistance program through [place] and is enrolled thru 08/09/2024     Tobacco Abuse - Currently uncontrolled - Provided motivational interviewing to assess tobacco use and strategies for reduction - Provided information on 1 800 QUIT NOW support program - Reviewed patients 2025 [facility] Formulary [phone]). Nicotine  replacement options are:   Nicotrol  NS inhaler is covered by [facility] but is tier 4 - first fill could cost about $510 / 28 days ($265 deductible + $127) second fill would be $127 / 28 days  Nicotine  patches and gum are available thru his over-the-counter benefits; He has $50 of over-the-counter benefits per every 3 months / quarter. Nicotine  patches thru [employer] are $35 for #14 of 21mg  strength or Nicotine  gum is $20 for 50 pieces. These can also be bought at a participating pharmacy - [facility], [facility] or [facility] are all listed. He could also order Nicotine  21mg  patches from [employer] for $16 / 30 patches. Discussed options with his sister and she will order patches from either [employer] or [employer].   - Recommended he start varenicline  0.5 mg daily for 3 days, then 0.5 mg twice daily for 4 days, then 1 mg twice daily thereafter. Counseled to take with food to reduce risk of GI adverse effects. Also counseled to stop therapy and contact provider if change in mood.    Follow Up Plan: 1 to 2 months  Madelin Ray, PharmD Clinical Pharmacist [facility]        SDOH (Social Determinants of Health) assessments and interventions performed:  SDOH Interventions    Flowsheet Row Patient Outreach from 05/11/2023 in [place] [facility] Care Coordination from 11/30/2022 in [facility] Coordination Care Coordination from 11/10/2022 in [facility] Care Coordination Chronic Care Management from 06/09/2022 in [facility] Chronic Care Management from 03/17/2022 in [facility] at [facility] Chronic Care Management from 02/04/2022 in [facility] at [facility]  SDOH Interventions        Food Insecurity Interventions Intervention Not Indicated -- Intervention Not Indicated Intervention Not Indicated -- Intervention Not Indicated  Housing Interventions -- -- Intervention Not Indicated Intervention Not Indicated -- Intervention Not Indicated  Transportation Interventions Intervention Not Indicated -- Intervention Not Indicated Intervention Not Indicated -- Intervention Not Indicated  Utilities Interventions -- -- Intervention Not Indicated Intervention Not Indicated -- --  Alcohol Usage Interventions Intervention Not Indicated (Score <7) -- -- -- -- --  Depression Interventions/Treatment  -- Counseling  [will discuss all treatment options] -- -- -- --  Financial Strain Interventions -- -- -- -- -- Intervention Not Indicated  Physical Activity Interventions -- -- -- -- -- Patient Refused  Stress Interventions -- -- Provide Counseling -- -- Intervention Not Indicated  Social Connections Interventions -- -- -- -- Intervention Not Indicated Intervention Not Indicated        Objective: Review of patient status, including review of consultants reports, laboratory and other test data, was performed as part of comprehensive evaluation and provision of chronic care management services.   Lab Results  Component Value Date   CREATININE 1.16 06/07/2023   CREATININE 1.09 12/09/2022   CREATININE 1.10 04/29/2022    Lab Results  Component Value Date   HGBA1C 6.6 (H) 06/07/2023       Component Value Date/Time   CHOL 132 06/07/2023 1411   CHOL  171 11/14/2019 1441   TRIG 81.0 06/07/2023 1411   HDL 47.00 06/07/2023 1411   HDL 37 (L) 11/14/2019 1441   CHOLHDL 3 06/07/2023 1411   VLDL 16.2 06/07/2023 1411   LDLCALC 68 06/07/2023 1411   LDLCALC 116 (H) 11/14/2019 1441   LDLDIRECT 116.0 05/12/2013 1517     Clinical ASCVD: Yes  The ASCVD Risk score (Arnett DK, et al., 2019) failed to calculate for the following reasons:   Risk score cannot be calculated because patient has a medical history suggesting prior/existing ASCVD    BP Readings from Last 3 Encounters:  06/08/23 104/64  06/07/23 126/68  05/14/23 126/60     Allergies  Allergen Reactions   Metformin  And Related Diarrhea and Other (See Comments)    Severe diarrhea   Wellbutrin  [Bupropion ] Nausea Only    Medications Reviewed Today     Reviewed by Carla Milling, RPH-CPP (Pharmacist) on 08/17/23 at 1154  Med List Status: <None>   Medication Order Taking? Sig Documenting Provider Last Dose Status Informant  albuterol  (VENTOLIN  HFA) 108 (90 Base) MCG/ACT inhaler [id number] No Inhale 2 puffs into the lungs every 6 (six) hours as needed for wheezing or shortness of breath. Wyn Jackee VEAR [name]., NP Taking Active   aspirin  EC 81 MG EC tablet [id number] No Take 1 tablet (81 mg total) by mouth daily with breakfast. Pearlean Manus, MD Taking Active Multiple Informants  atorvastatin  (LIPITOR ) 80 MG tablet [id number] No Take 1 tablet (80 mg total) by mouth daily. Verlin Lonni BIRCH, MD Taking Active   B Complex-C (B-COMPLEX WITH VITAMIN C) tablet [id number] No Take 1 tablet by mouth daily. [provider] Taking Active   Budeson-Glycopyrrol-Formoterol  (BREZTRI  AEROSPHERE) 160-9-4.8 MCG/ACT AERO [id number]  Inhale 2 puffs into the lungs in the morning and at bedtime. Amon Aloysius BRAVO, MD  Active  Med Note JUSTINO, Lyne Khurana B   Tue Aug 17, 2023 11:54 AM) [place] thru 08/09/2024  Cholecalciferol  (VITAMIN D -3) 125 MCG (5000 UT) TABS [id number]  No Take 5,000 Units by mouth daily. [provider] Taking Active Multiple Informants  clopidogrel  (PLAVIX ) 75 MG tablet [id number] No TAKE 1 TABLET EVERY DAY Verlin Lonni BIRCH, MD Taking Active   Cyanocobalamin  (VITAMIN B-12) 5000 MCG SUBL [id number] No Place 5,000 mcg under the tongue daily. [provider] Taking Active Multiple Informants           Med Note (CANTER, KAYLYN D   Wed Dec 09, 2022  2:22 PM) Sometimes  dapagliflozin  propanediol (FARXIGA ) 5 MG TABS tablet [id number] No Take 1 tablet (5 mg total) by mouth daily before breakfast. [name], Jose E, MD Taking Active            Med Note [facility], [place]   Tue Aug 17, 2023 11:54 AM) [place] thru 08/09/2024  ezetimibe  (ZETIA ) 10 MG tablet [id number] No TAKE 1 TABLET EVERY DAY (NEED MD APPOINTMENT FOR REFILLS) Verlin Lonni BIRCH, MD Taking Active   fenofibrate  (TRICOR ) 145 MG tablet [id number] No Take 1 tablet (145 mg total) by mouth daily. [name], Jose E, MD Taking Active   guaiFENesin  (MUCINEX ) 600 MG 12 hr tablet [id number] No Take 600 mg by mouth 2 (two) times daily as needed for cough. [provider] Taking Active            Med Note LAZARO, KAYLYN D   Wed Dec 09, 2022  2:23 PM) PRN  isosorbide  mononitrate (IMDUR ) 60 MG 24 hr tablet [id number] No TAKE 1 AND 1/2 TABLETS EVERY DAY Verlin Lonni BIRCH, MD Taking Active   methocarbamol  (ROBAXIN ) 500 MG tablet [id number] No Take by mouth daily as needed for muscle spasms. [provider] Taking Active   nicotine  (NICOTROL ) 10 MG inhaler [id number]  Take short and shallow puffs from the inhaler as needed for cravings. [name], Jose E, MD  Active   oxyCODONE -acetaminophen  (PERCOCET) 10-325 MG tablet [id number] No Take 2 tablets by mouth every 6 (six) hours as needed for pain. [provider] Taking Active Self           Med Note LAZARO, LILA BIRCH Kitchens Jun 07, 2023  1:30 PM)    OXYGEN  [id number] No Inhale 2-4 L/min into the  lungs continuous. [provider] Taking Active Multiple Informants           Med Note LAZARO, LILA BIRCH   Tue Jun 08, 2023 11:46 AM)    pantoprazole  (PROTONIX ) 40 MG tablet [id number] No TAKE 1 TABLET TWICE DAILY (NEED MD APPOINTMENT FOR REFILLS) Verlin Lonni BIRCH, MD Taking Active   PRESCRIPTION MEDICATION [id number] No CPAP- At bedtime [provider] Taking Active Multiple Informants  spironolactone  (ALDACTONE ) 25 MG tablet [id number] No Take 12.5 mg by mouth once. [provider] Taking Active   tamsulosin  (FLOMAX ) 0.4 MG CAPS capsule [id number] No Take 1 capsule (0.4 mg total) by mouth in the morning and at bedtime. Amon Aloysius BRAVO, MD Taking Active   traZODone  (DESYREL ) 100 MG tablet [id number]  Take 2 tablets (200 mg total) by mouth at bedtime. Amon Aloysius BRAVO, MD  Active   valsartan  (DIOVAN ) 40 MG tablet [id number] No Take 1 tablet (40 mg total) by mouth daily. Amon Aloysius BRAVO, MD Taking Active   Varenicline  Tartrate, Starter, (CHANTIX  STARTING MONTH PAK) 0.5 MG X 11 &  1 MG X 42 TBPK [id number]  Take according to starter pack directions *take with foodDEWAINE Amon Aloysius FORBES, MD  Active             Patient Active Problem List   Diagnosis Date Noted   Palliative care encounter 04/13/2022   COPD (chronic obstructive pulmonary disease) (HCC) 04/07/2022   Class 1 obesity 12/25/2021   Acute on chronic diastolic heart failure (HCC) 12/23/2021   Chronic respiratory failure with hypoxia (HCC) 12/23/2021   COPD exacerbation (HCC) 12/23/2021   Alcohol use 12/23/2021   (HFpEF) heart failure with preserved ejection fraction (HCC)    DOE (dyspnea on exertion) 11/07/2020   Type 2 diabetes mellitus with hyperlipidemia (HCC) 03/18/2020   Prolapsed lumbar disc 01/25/2020   Hiatal hernia    Hepatic steatosis    Hemianopia, homonymous, right    ETOH abuse    Esophagitis    Chronic diastolic CHF (congestive heart failure) (HCC)    Chronic bronchitis (HCC)    CAD (coronary artery  disease)    PAD (peripheral artery disease) (HCC) 09/23/2016   Morbid obesity (HCC)    Chronic pain syndrome 10/23/2015   GERD (gastroesophageal reflux disease) 08/07/2015   PCP NOTES >>>>> 06/01/2015   Annual physical exam 05/31/2015   Acute ischemic stroke (HCC) 02/20/2015   BPH (benign prostatic hyperplasia) 02/20/2015   OSA 05/05/2013   Erectile dysfunction 03/17/2012   Emphysema (subcutaneous) (surgical) resulting from a procedure 10/08/2008   Iron deficiency anemia 08/10/2008   ABNORMALITY OF GAIT 08/06/2008   Cigarette smoker 07/27/2008   Essential hypertension 07/26/2008   ST elevation myocardial infarction involving left circumflex coronary artery (HCC) 07/26/2008     Medication Assistance:  Farxiga  obt[place] through [place].  Enrollment ends 08/09/2024 .    Assessment / Plan:  CHF - stable Continue Farxiga  and valsartan  Recommended he weigh daily - report weight gain of more than 3 lbs in 24 hours or 5 lbs in 1 week.   Type 2 DM - at goal, Farxiga  mostly for CHF Continue Farxiga  5mg  daily Ok to not check blood glucose at home at this time. IF A1c increases to > 7% would consider prescribing home glucometer.   Hyperlipidemia - at goal of LDL < 70. Continue ezetimibe , atorvastatin  80mg  daily and fenofibrate .   COPD - no recent exacerbations:   Reviewed when to use albuterol  inhaler - for shortness of breath or wheezing.  Sent message to pulmonology office requesting updated Rx for Breztri  to be sent to patient's pharmacy (looks like last Rx was still noted as a sample and not sent to his pharmacy)   Med Management:  Reviewed Rx and over-the-counter meds. Updated med list.  Reviewed refill history and adherence.  Called [place] Farxiga  and confirmed that he was been re-enrolled thru 08/09/2024. There is an active Rx with refills at [facility].  Will also see about adding Breztri  to his [place] - will need to complete new  application for Breztri . a    Follow Up:  Telephone follow up appointment with care management team member scheduled for:  1 to 2 months     Madelin Ray, PharmD Clinical Pharmacist [facility]  - [facility] [phone]

## 2023-08-17 NOTE — Progress Notes (Signed)
 Patient has been approved to receive Breztri  from [place]. Called [place] to check on first fill / delivery. Tracking number received and checked. Breztri  was filled 08/09/2023 and per tracking number should be delivered today 08/17/2023 - see phone visit for more details. Patinent and his sister were notified.

## 2023-08-17 NOTE — Telephone Encounter (Signed)
 Copied from CRM [id number]. Topic: General - Other >> Aug 17, 2023  9:35 AM Macario HERO wrote: Reason for CRM: Patient sister Wanda calling and stated that patient went to pick up inhaler and it was 548 dollars and would like to speak to [employer] regarding other options and she mentioned a discount. Patient sister is requesting a call back. [phone]

## 2023-08-18 ENCOUNTER — Telehealth: Payer: Self-pay

## 2023-08-18 NOTE — Patient Outreach (Signed)
  Care Management   Outreach Note  08/18/2023 Name: [name]. MRN: [id number] DOB: 12-Jul-1961  An unsuccessful telephone outreach was attempted today to contact the patient about Care Management needs.     Follow Up Plan:  A HIPAA compliant phone message was left for the patient providing contact information and requesting a return call.    [facility]  [facility], [facility] RN Care Manager Direct Dial: [phone] Website: [email or web address]

## 2023-08-23 DIAGNOSIS — M5451 Vertebrogenic low back pain: Secondary | ICD-10-CM | POA: Diagnosis not present

## 2023-08-24 ENCOUNTER — Other Ambulatory Visit: Payer: Self-pay

## 2023-09-01 ENCOUNTER — Other Ambulatory Visit: Payer: Self-pay | Admitting: Cardiovascular Disease

## 2023-09-06 ENCOUNTER — Other Ambulatory Visit: Payer: Self-pay

## 2023-09-06 ENCOUNTER — Telehealth: Payer: Self-pay

## 2023-09-06 NOTE — Care Management (Signed)
  Care Management   Outreach Note  09/06/2023 Name: [name]. MRN: [id number] DOB: [birth year]  An unsuccessful outreach attempt was made today for a scheduled Care Management visit.     Follow Up Plan:  A HIPAA compliant phone message was left for the patient providing contact information and requesting a return call.     Juanell Fairly [facility] RN Care Manager Direct Dial: [phone]  Fax: [phone] Website: [email or web address]

## 2023-09-07 DIAGNOSIS — I739 Peripheral vascular disease, unspecified: Secondary | ICD-10-CM | POA: Diagnosis not present

## 2023-09-07 DIAGNOSIS — Z79899 Other long term (current) drug therapy: Secondary | ICD-10-CM | POA: Diagnosis not present

## 2023-09-07 DIAGNOSIS — M5126 Other intervertebral disc displacement, lumbar region: Secondary | ICD-10-CM | POA: Diagnosis not present

## 2023-09-07 DIAGNOSIS — M5451 Vertebrogenic low back pain: Secondary | ICD-10-CM | POA: Diagnosis not present

## 2023-09-07 DIAGNOSIS — M5416 Radiculopathy, lumbar region: Secondary | ICD-10-CM | POA: Diagnosis not present

## 2023-09-07 DIAGNOSIS — F17200 Nicotine dependence, unspecified, uncomplicated: Secondary | ICD-10-CM | POA: Diagnosis not present

## 2023-09-07 DIAGNOSIS — Z5181 Encounter for therapeutic drug level monitoring: Secondary | ICD-10-CM | POA: Diagnosis not present

## 2023-09-08 ENCOUNTER — Ambulatory Visit: Payer: Medicare HMO | Admitting: Pharmacist

## 2023-09-08 DIAGNOSIS — F17200 Nicotine dependence, unspecified, uncomplicated: Secondary | ICD-10-CM

## 2023-09-08 DIAGNOSIS — J449 Chronic obstructive pulmonary disease, unspecified: Secondary | ICD-10-CM

## 2023-09-08 DIAGNOSIS — I5032 Chronic diastolic (congestive) heart failure: Secondary | ICD-10-CM

## 2023-09-08 NOTE — Progress Notes (Signed)
Pharmacy Note  07/19/2023 Name: [name]. MRN: [id number] DOB: September 13, 1960  Subjective: [name]. is a 63 y.o. year old male who is a primary care patient of Wanda Plump, MD. Clinical Pharmacist Practitioner referral was placed to assist with medication management. Patient is still hoping to be scheduled for back surgery in 2025 but the neurosurgeon has referred him back to vascular - Dr Kirke Corin.   Engaged with patient today   by phone for medication management follow up / smoking cessation / CHF.   CHF/ HTN:  Current therapy: Farxiga 5mg  daily, valsartan 40mg  daily, spironolactone 25mg  - 0.5 tablet once a day.  Denies edema.   Wt Readings from Last 3 Encounters:  06/08/23 213 lb 9.6 oz (96.9 kg)  06/07/23 214 lb 2 oz (97.1 kg)  05/14/23 211 lb 6.4 oz (95.9 kg)    Type 2 DM:  Current therapy - taking Farxiga 5mg  daily but is more for CHF.  Patient does not check blood glucose at home.  Hyperlipidemia: last LDL was at goal LDL = 68 and triglycerides = 81 Patient is taking atorvastatin 80mg  daily, ezetimibe 10mg  daily and fenofibrate 160mg  daily.    COPD:  Managed by Dr Sherene Sires.  Current therapy: Breztri 2 inhalations twice a day    Past therapy: Symbicort   Also uses supplemental O2 as needed at night time but he has not needed in the last month.  Reports he has not used rescue inhaler in over a month  Tobacco Abuse: Has not started Chantix or patches yet. He is waiting for for surgery to be schedule.   Tobacco Use History: Age when started using tobacco on a daily basis: 63 years old Started at 73 or 63 years old Number of cigarettes per day 1.5 ppd Smokes first cigarette 5 minutes after waking Does wake at night to smoke Triggers include: HABITS - getting update night to use bathroom, eating  Quit Attempt History: Most recent quit attempt about 1 year ago Longest time ever been tobacco free 2 months (after his open heart surgery - quit cold  [place]) Methods tried in the past include  nicotine lozenges, patches; Chantix (not very helpful) and Zyban (caused nausea) .  Rates IMPORTANCE of quitting tobacco on 1-10 scale of 10. Rates READINESS of quitting tobacco on 1-10 scale of 10 Rates CONFIDENCE of quitting tobacco on 1-10 scale of 3. Motivators to quitting include surgery (back) ; barriers include fear of failing again, friends smoke, and under a lot of stress now    Objective:  Lab Results  Component Value Date   HGBA1C 6.6 (H) 06/07/2023    Lab Results  Component Value Date   CREATININE 1.16 06/07/2023   BUN 16 06/07/2023   NA 137 06/07/2023   K 4.4 06/07/2023   CL 100 06/07/2023   CO2 29 06/07/2023    Lab Results  Component Value Date   CHOL 132 06/07/2023   HDL 47.00 06/07/2023   LDLCALC 68 06/07/2023   LDLDIRECT 116.0 05/12/2013   TRIG 81.0 06/07/2023   CHOLHDL 3 06/07/2023    Medications Reviewed Today     Reviewed by Henrene Pastor, RPH-CPP (Pharmacist) on 09/08/23 at 1447  Med List Status: <None>   Medication Order Taking? Sig Documenting Provider Last Dose Status Informant  albuterol (VENTOLIN HFA) 108 (90 Base) MCG/ACT inhaler [id number] Yes Inhale 2 puffs into the lungs every 6 (six) hours as needed for wheezing or shortness of breath. Robin Searing  Maud Deed., NP Taking Active   aspirin EC 81 MG EC tablet [id number] Yes Take 1 tablet (81 mg total) by mouth daily with breakfast. Shon Hale, MD Taking Active Multiple Informants  atorvastatin (LIPITOR) 80 MG tablet [id number] Yes Take 1 tablet (80 mg total) by mouth daily. Kathleene Hazel, MD Taking Active   B Complex-C (B-COMPLEX WITH VITAMIN C) tablet [id number]  Take 1 tablet by mouth daily. [provider]  Active   Budeson-Glycopyrrol-Formoterol (BREZTRI AEROSPHERE) 160-9-4.8 MCG/ACT AERO [id number] Yes Inhale 2 puffs into the lungs in the morning and at bedtime. Wanda Plump, MD Taking Active            Med Note [facility] [place] [facility]    Tue Aug 17, 2023 11:54 AM) [place] thru 08/09/2024  Cholecalciferol (VITAMIN D-3) 125 MCG (5000 UT) TABS [id number]  Take 5,000 Units by mouth daily. [provider]  Active Multiple Informants  clopidogrel (PLAVIX) 75 MG tablet [id number] Yes TAKE 1 TABLET EVERY DAY Kathleene Hazel, MD Taking Active   Cyanocobalamin (VITAMIN B-12) 5000 MCG SUBL [id number]  Place 5,000 mcg under the tongue daily. [provider]  Active Multiple Informants           Med Note (CANTER, Vicente Males D   Wed Dec 09, 2022  2:22 PM) Sometimes  dapagliflozin propanediol (FARXIGA) 5 MG TABS tablet [id number] Yes Take 1 tablet (5 mg total) by mouth daily before breakfast. Wanda Plump, MD Taking Active            Med Note [facility], [place]   Tue Aug 17, 2023 11:54 AM) [place] thru 08/09/2024  ezetimibe (ZETIA) 10 MG tablet [id number] Yes TAKE 1 TABLET EVERY DAY (NEED MD APPOINTMENT FOR REFILLS) Kathleene Hazel, MD Taking Active   fenofibrate (TRICOR) 145 MG tablet [id number] Yes Take 1 tablet (145 mg total) by mouth daily. Wanda Plump, MD Taking Active   guaiFENesin (MUCINEX) 600 MG 12 hr tablet [id number]  Take 600 mg by mouth 2 (two) times daily as needed for cough. [provider]  Active            Med Note (CANTER, Vicente Males D   Wed Dec 09, 2022  2:23 PM) PRN  isosorbide mononitrate (IMDUR) 60 MG 24 hr tablet [id number] Yes TAKE 1 AND 1/2 TABLETS EVERY DAY Kathleene Hazel, MD Taking Active   methocarbamol (ROBAXIN) 500 MG tablet [id number]  Take by mouth daily as needed for muscle spasms. [provider]  Active   nicotine (NICODERM CQ - DOSED IN MG/24 HOURS) 21 mg/24hr patch [id number] No Place 1 patch (21 mg total) onto the skin daily.  Patient not taking: Reported on 09/08/2023   Henrene Pastor, RPH-CPP Not Taking Active   oxyCODONE-acetaminophen (PERCOCET) 10-325 MG tablet [id number] Yes Take 2 tablets by mouth every 6  (six) hours as needed for pain. [provider] Taking Active Self           Med Note Loreli Dollar, Raynaldo Opitz Jun 07, 2023  1:30 PM)    OXYGEN [id number] Yes Inhale 2-4 L/min into the lungs continuous. [provider] Taking Active Multiple Informants           Med Note Loreli Dollar, Paula Libra   Tue Jun 08, 2023 11:46 AM)    pantoprazole (PROTONIX) 40 MG tablet [id number] Yes TAKE 1 TABLET TWICE DAILY (NEED MD APPOINTMENT FOR REFILLS) Kathleene Hazel, MD  Taking Active   PRESCRIPTION MEDICATION [id number] Yes CPAP- At bedtime [provider] Taking Active Multiple Informants  spironolactone (ALDACTONE) 25 MG tablet [id number] Yes TAKE 1/2 TABLET EVERY DAY Kathleene Hazel, MD Taking Active   tamsulosin [facility]) 0.4 MG CAPS capsule [id number] Yes Take 1 capsule (0.4 mg total) by mouth in the morning and at bedtime. Wanda Plump, MD Taking Active   traZODone (DESYREL) 100 MG tablet [id number] Yes Take 2 tablets (200 mg total) by mouth at bedtime. Wanda Plump, MD Taking Active   valsartan (DIOVAN) 40 MG tablet [id number] Yes Take 1 tablet (40 mg total) by mouth daily. Wanda Plump, MD Taking Active   Varenicline Tartrate, Starter, (CHANTIX STARTING MONTH PAK) 0.5 MG X 11 & 1 MG X 42 TBPK [id number] No Take according to starter pack directions *take with food*  Patient not taking: Reported on 09/08/2023   Wanda Plump, MD Not Taking Active               Assessment/Plan:   Hypertension / CHF:  - Currently controlled - Recommend to continue Farxiga, spironolactone and valsartan - [place] form [place] thru 08/09/2024  Hyperlipidemia/ASCVD Risk Reduction: - Currently controlled.  - Recommend to continue atorvastatin and fenofibrate   COPD: - Currently controlled.  - Reviewed appropriate inhaler technique. - Recommend to continue Breztri -  [place] thru [place] until 08/09/2024   Tobacco Abuse - Currently uncontrolled -  Provided motivational interviewing to assess tobacco use and strategies for reduction - Encouraged patient to go ahead and start smoking cessation medications - varenicline and nicotine patches since he is ready to quit. He does no have to wait until surgery is scheduled.     Follow Up Plan: 2 months  Henrene Pastor, PharmD Clinical Pharmacist [facility]        SDOH (Social Determinants of Health) assessments and interventions performed:  SDOH Interventions    Flowsheet Row Patient Outreach from 05/11/2023 in [place] [facility] DEPARTMENT Care Coordination from 11/30/2022 in [facility] Community Care Coordination Care Coordination from 11/10/2022 in [facility] Community Care Coordination Chronic Care Management from 06/09/2022 in [facility] Primary Care at [facility] Chronic Care Management from 03/17/2022 in [facility] Primary Care at [facility] Chronic Care Management from 02/04/2022 in [facility] Primary Care at [facility]  SDOH Interventions        Food Insecurity Interventions Intervention Not Indicated -- Intervention Not Indicated Intervention Not Indicated -- Intervention Not Indicated  Housing Interventions -- -- Intervention Not Indicated Intervention Not Indicated -- Intervention Not Indicated  Transportation Interventions Intervention Not Indicated -- Intervention Not Indicated Intervention Not Indicated -- Intervention Not Indicated  Utilities Interventions -- -- Intervention Not Indicated Intervention Not Indicated -- --  Alcohol Usage Interventions Intervention Not Indicated (Score <7) -- -- -- -- --  Depression Interventions/Treatment  -- Counseling  [will discuss all treatment options] -- -- -- --  Financial Strain Interventions -- -- -- -- -- Intervention Not Indicated  Physical Activity Interventions -- -- -- -- -- Patient Refused  Stress  Interventions -- -- Provide Counseling -- -- Intervention Not Indicated  Social Connections Interventions -- -- -- -- Intervention Not Indicated Intervention Not Indicated        Objective: Review of patient status, including review of consultants reports, laboratory and other test data, was performed as  part of comprehensive evaluation and provision of chronic care management services.   Lab Results  Component Value Date   CREATININE 1.16 06/07/2023   CREATININE 1.09 12/09/2022   CREATININE 1.10 04/29/2022    Lab Results  Component Value Date   HGBA1C 6.6 (H) 06/07/2023       Component Value Date/Time   CHOL 132 06/07/2023 1411   CHOL 171 11/14/2019 1441   TRIG 81.0 06/07/2023 1411   HDL 47.00 06/07/2023 1411   HDL 37 (L) 11/14/2019 1441   CHOLHDL 3 06/07/2023 1411   VLDL 16.2 06/07/2023 1411   LDLCALC 68 06/07/2023 1411   LDLCALC 116 (H) 11/14/2019 1441   LDLDIRECT 116.0 05/12/2013 1517     Clinical ASCVD: Yes  The ASCVD Risk score (Arnett DK, et al., 2019) failed to calculate for the following reasons:   Risk score cannot be calculated because patient has a medical history suggesting prior/existing ASCVD    BP Readings from Last 3 Encounters:  06/08/23 104/64  06/07/23 126/68  05/14/23 126/60     Allergies  Allergen Reactions   Metformin And Related Diarrhea and Other (See Comments)    Severe diarrhea   Wellbutrin [Bupropion] Nausea Only    Medications Reviewed Today     Reviewed by Henrene Pastor, RPH-CPP (Pharmacist) on 09/08/23 at 1447  Med List Status: <None>   Medication Order Taking? Sig Documenting Provider Last Dose Status Informant  albuterol (VENTOLIN HFA) 108 (90 Base) MCG/ACT inhaler [id number] Yes Inhale 2 puffs into the lungs every 6 (six) hours as needed for wheezing or shortness of breath. Gaston Islam., NP Taking Active   aspirin EC 81 MG EC tablet [id number] Yes Take 1 tablet (81 mg total) by mouth daily with breakfast. Shon Hale, MD Taking Active Multiple Informants  atorvastatin (LIPITOR) 80 MG tablet [id number] Yes Take 1 tablet (80 mg total) by mouth daily. Kathleene Hazel, MD Taking Active   B Complex-C (B-COMPLEX WITH VITAMIN C) tablet [id number]  Take 1 tablet by mouth daily. [provider]  Active   Budeson-Glycopyrrol-Formoterol (BREZTRI AEROSPHERE) 160-9-4.8 MCG/ACT AERO [id number] Yes Inhale 2 puffs into the lungs in the morning and at bedtime. Wanda Plump, MD Taking Active            Med Note [facility], [place] [facility]   Tue Aug 17, 2023 11:54 AM) [place] thru 08/09/2024  Cholecalciferol (VITAMIN D-3) 125 MCG (5000 UT) TABS [id number]  Take 5,000 Units by mouth daily. [provider]  Active Multiple Informants  clopidogrel (PLAVIX) 75 MG tablet [id number] Yes TAKE 1 TABLET EVERY DAY Kathleene Hazel, MD Taking Active   Cyanocobalamin (VITAMIN B-12) 5000 MCG SUBL [id number]  Place 5,000 mcg under the tongue daily. [provider]  Active Multiple Informants           Med Note (CANTER, Vicente Males D   Wed Dec 09, 2022  2:22 PM) Sometimes  dapagliflozin propanediol (FARXIGA) 5 MG TABS tablet [id number] Yes Take 1 tablet (5 mg total) by mouth daily before breakfast. Wanda Plump, MD Taking Active            Med Note [facility] [place] [facility]   Tue Aug 17, 2023 11:54 AM) [place] thru 08/09/2024  ezetimibe (ZETIA) 10 MG tablet [id number] Yes TAKE 1 TABLET EVERY DAY (NEED MD APPOINTMENT FOR REFILLS) Kathleene Hazel, MD Taking Active   fenofibrate (TRICOR) 145 MG tablet [id number] Yes Take 1  tablet (145 mg total) by mouth daily. Wanda Plump, MD Taking Active   guaiFENesin (MUCINEX) 600 MG 12 hr tablet [id number]  Take 600 mg by mouth 2 (two) times daily as needed for cough. [provider]  Active            Med Note (CANTER, Vicente Males D   Wed Dec 09, 2022  2:23 PM) PRN  isosorbide mononitrate (IMDUR) 60 MG 24 hr tablet [id number]  Yes TAKE 1 AND 1/2 TABLETS EVERY DAY Kathleene Hazel, MD Taking Active   methocarbamol (ROBAXIN) 500 MG tablet [id number]  Take by mouth daily as needed for muscle spasms. [provider]  Active   nicotine (NICODERM CQ - DOSED IN MG/24 HOURS) 21 mg/24hr patch [id number] No Place 1 patch (21 mg total) onto the skin daily.  Patient not taking: Reported on 09/08/2023   Henrene Pastor, RPH-CPP Not Taking Active   oxyCODONE-acetaminophen (PERCOCET) 10-325 MG tablet [id number] Yes Take 2 tablets by mouth every 6 (six) hours as needed for pain. [provider] Taking Active Self           Med Note Loreli Dollar, Raynaldo Opitz Jun 07, 2023  1:30 PM)    OXYGEN [id number] Yes Inhale 2-4 L/min into the lungs continuous. [provider] Taking Active Multiple Informants           Med Note Loreli Dollar, Paula Libra   Tue Jun 08, 2023 11:46 AM)    pantoprazole (PROTONIX) 40 MG tablet [id number] Yes TAKE 1 TABLET TWICE DAILY (NEED MD APPOINTMENT FOR REFILLS) Kathleene Hazel, MD Taking Active   PRESCRIPTION MEDICATION [id number] Yes CPAP- At bedtime [provider] Taking Active Multiple Informants  spironolactone (ALDACTONE) 25 MG tablet [id number] Yes TAKE 1/2 TABLET EVERY DAY Kathleene Hazel, MD Taking Active   tamsulosin [facility]) 0.4 MG CAPS capsule [id number] Yes Take 1 capsule (0.4 mg total) by mouth in the morning and at bedtime. Wanda Plump, MD Taking Active   traZODone (DESYREL) 100 MG tablet [id number] Yes Take 2 tablets (200 mg total) by mouth at bedtime. Wanda Plump, MD Taking Active   valsartan (DIOVAN) 40 MG tablet [id number] Yes Take 1 tablet (40 mg total) by mouth daily. Wanda Plump, MD Taking Active   Varenicline Tartrate, Starter, (CHANTIX STARTING MONTH PAK) 0.5 MG X 11 & 1 MG X 42 TBPK [id number] No Take according to starter pack directions *take with food*  Patient not taking: Reported on 09/08/2023   Wanda Plump, MD Not Taking Active              Patient Active Problem List   Diagnosis Date Noted   Palliative care encounter 04/13/2022   COPD (chronic obstructive pulmonary disease) (HCC) 04/07/2022   Class 1 obesity 12/25/2021   Acute on chronic diastolic heart failure (HCC) 12/23/2021   Chronic respiratory failure with hypoxia (HCC) 12/23/2021   COPD exacerbation (HCC) 12/23/2021   Alcohol use 12/23/2021   (HFpEF) heart failure with preserved ejection fraction (HCC)    DOE (dyspnea on exertion) 11/07/2020   Type 2 diabetes mellitus with hyperlipidemia (HCC) 03/18/2020   Prolapsed lumbar disc 01/25/2020   Hiatal hernia    Hepatic steatosis    Hemianopia, homonymous, right    ETOH abuse    Esophagitis    Chronic diastolic CHF (congestive heart failure) (HCC)    Chronic bronchitis (HCC)    CAD (coronary artery disease)    PAD (peripheral artery disease) (HCC) 09/23/2016  Morbid obesity (HCC)    Chronic pain syndrome 10/23/2015   GERD (gastroesophageal reflux disease) 08/07/2015   PCP NOTES >>>>> 06/01/2015   Annual physical exam 05/31/2015   Acute ischemic stroke (HCC) 02/20/2015   BPH (benign prostatic hyperplasia) 02/20/2015   OSA 05/05/2013   Erectile dysfunction 03/17/2012   Emphysema (subcutaneous) (surgical) resulting from a procedure 10/08/2008   Iron deficiency anemia 08/10/2008   ABNORMALITY OF GAIT 08/06/2008   Cigarette smoker 07/27/2008   Essential hypertension 07/26/2008   ST elevation myocardial infarction involving left circumflex coronary artery (HCC) 07/26/2008     Medication Assistance:  [name] obtained through [facility] and Me medication assistance program.  Enrollment ends 08/09/2024 .    Assessment / Plan:  CHF - stable Continue Farxiga and valsartan Recommended he weigh daily - report weight gain of more than 3 lbs in 24 hours or 5 lbs in 1 week.   Type 2 DM - at goal, [place] mostly for CHF Continue Farxiga 5mg  daily Ok to not check blood glucose at home at this time. IF A1c increases  to > 7% would consider prescribing home glucometer.   Hyperlipidemia - at goal of LDL < 70. Continue ezetimibe, atorvastatin 80mg  daily and fenofibrate.   COPD - no recent exacerbations:   Reviewed when to use albuterol inhaler - for shortness of breath or wheezing.  Sent message to pulmonology office requesting updated Rx for Breztri to be sent to patient's pharmacy (looks like last Rx was still noted as a sample and not sent to his pharmacy)   Med Management:  Reviewed Rx and over-the-counter meds. Updated med list.  Reviewed refill history and adherence.  Called [place] / [name] and confirmed that he was been re-enrolled thru 08/09/2024. There is an active Rx with refills at [facility].  Will also see about adding Breztri to his [place] - will need to complete new application for Breztri. a    Follow Up:  Telephone follow up appointment with care management team member scheduled for:  1 to 2 months     Henrene Pastor, PharmD Clinical Pharmacist [facility] Primary [facility]  - [facility] [phone]

## 2023-09-10 ENCOUNTER — Other Ambulatory Visit (HOSPITAL_COMMUNITY): Payer: Self-pay | Admitting: Orthopedic Surgery

## 2023-09-10 DIAGNOSIS — I739 Peripheral vascular disease, unspecified: Secondary | ICD-10-CM

## 2023-09-16 ENCOUNTER — Ambulatory Visit (HOSPITAL_COMMUNITY)
Admission: RE | Admit: 2023-09-16 | Discharge: 2023-09-16 | Disposition: A | Discharge status: Home / Self Care | Payer: Medicare HMO | Source: Ambulatory Visit | Attending: Cardiovascular Disease | Admitting: Cardiovascular Disease

## 2023-09-16 DIAGNOSIS — I739 Peripheral vascular disease, unspecified: Secondary | ICD-10-CM | POA: Insufficient documentation

## 2023-09-24 ENCOUNTER — Other Ambulatory Visit: Payer: Self-pay | Admitting: Cardiovascular Disease

## 2023-09-29 ENCOUNTER — Ambulatory Visit: Payer: Self-pay | Admitting: Orthopedic Surgery

## 2023-09-29 DIAGNOSIS — M5126 Other intervertebral disc displacement, lumbar region: Secondary | ICD-10-CM

## 2023-10-01 NOTE — Telephone Encounter (Signed)
Unsuccessful Outreach

## 2023-10-06 NOTE — Pre-Procedure Instructions (Signed)
 Surgical Instructions   Your procedure is scheduled on October 14, 2023. Report to [facility] Main Entrance "A" at 5:30 A.M., then check in with the Admitting office. Any questions or running late day of surgery: call [phone]  Questions prior to your surgery date: call [phone], Monday-Friday, 8am-4pm. If you experience any cold or flu symptoms such as cough, fever, chills, shortness of breath, etc. between now and your scheduled surgery, please notify us at the above number.     Remember:  Do not eat after midnight the night before your surgery  You may drink clear liquids until 4:30 AM the morning of your surgery.   Clear liquids allowed are: Water, Non-Citrus Juices (without pulp), Carbonated Beverages, Clear Tea (no milk, honey, etc.), Black Coffee Only (NO MILK, CREAM OR POWDERED CREAMER of any kind), and Gatorade.  Patient Instructions  The night before surgery:  No food after midnight. ONLY clear liquids after midnight  The day of surgery (if you do NOT have diabetes):  Drink ONE (1) Pre-Surgery Clear Ensure by 4:30 AM the morning of surgery. Drink in one sitting. Do not sip.  This drink was given to you during your hospital  pre-op appointment visit.  Nothing else to drink after completing the  Pre-Surgery Clear Ensure.         If you have questions, please contact your surgeon's office.    Take these medicines the morning of surgery with A SIP OF WATER: atorvastatin (LIPITOR)  Budeson-Glycopyrrol-Formoterol (BREZTRI AEROSPHERE)  ezetimibe (ZETIA)  fenofibrate (TRICOR)  isosorbide mononitrate (IMDUR)  pantoprazole (PROTONIX)  tamsulosin [facility])    May take these medicines IF NEEDED: albuterol (VENTOLIN HFA) inhaler - please bring inhaler with you morning of surgery methocarbamol (ROBAXIN)  oxyCODONE-acetaminophen (PERCOCET)    Follow your surgeon's instructions on when to stop Aspirin and clopidogrel (PLAVIX).  If no instructions were given by your  surgeon then you will need to call the office to get those instructions.    STOP taking your dapagliflozin propanediol (FARXIGA) three days prior to surgery. Your last dose will be March 2nd.   One week prior to surgery, STOP taking any Aleve, Naproxen, Ibuprofen, Motrin, Advil, Goody's, BC's, all herbal medications, fish oil, and non-prescription vitamins.                     Do NOT Smoke (Tobacco/Vaping) for 24 hours prior to your procedure.  If you use a CPAP at night, you may bring your mask/headgear for your overnight stay.   You will be asked to remove any contacts, glasses, piercing's, hearing aid's, dentures/partials prior to surgery. Please bring cases for these items if needed.    Patients discharged the day of surgery will not be allowed to drive home, and someone needs to stay with them for 24 hours.  SURGICAL WAITING ROOM VISITATION Patients may have no more than 2 support people in the waiting area - these visitors may rotate.   Pre-op nurse will coordinate an appropriate time for 1 ADULT support person, who may not rotate, to accompany patient in pre-op.  Children under the age of 49 must have an adult with them who is not the patient and must remain in the main waiting area with an adult.  If the patient needs to stay at the hospital during part of their recovery, the visitor guidelines for inpatient rooms apply.  Please refer to the [facility] website for the visitor guidelines for any additional information.   If you  received a COVID test during your pre-op visit  it is requested that you wear a mask when out in public, stay away from anyone that may not be feeling well and notify your surgeon if you develop symptoms. If you have been in contact with anyone that has tested positive in the last 10 days please notify you surgeon.      Pre-operative 5 CHG Bathing Instructions   You can play a key role in reducing the risk of infection after surgery. Your skin needs to  be as free of germs as possible. You can reduce the number of germs on your skin by washing with CHG (chlorhexidine gluconate) soap before surgery. CHG is an antiseptic soap that kills germs and continues to kill germs even after washing.   DO NOT use if you have an allergy to chlorhexidine/CHG or antibacterial soaps. If your skin becomes reddened or irritated, stop using the CHG and notify one of our RNs at [phone].   Please shower with the CHG soap starting 4 days before surgery using the following schedule:     Please keep in mind the following:  DO NOT shave, including legs and underarms, starting the day of your first shower.   You may shave your face at any point before/day of surgery.  Place clean sheets on your bed the day you start using CHG soap. Use a clean washcloth (not used since being washed) for each shower. DO NOT sleep with pets once you start using the CHG.   CHG Shower Instructions:  Wash your face and private area with normal soap. If you choose to wash your hair, wash first with your normal shampoo.  After you use shampoo/soap, rinse your hair and body thoroughly to remove shampoo/soap residue.  Turn the water OFF and apply about 3 tablespoons (45 ml) of CHG soap to a CLEAN washcloth.  Apply CHG soap ONLY FROM YOUR NECK DOWN TO YOUR TOES (washing for 3-5 minutes)  DO NOT use CHG soap on face, private areas, open wounds, or sores.  Pay special attention to the area where your surgery is being performed.  If you are having back surgery, having someone wash your back for you may be helpful. Wait 2 minutes after CHG soap is applied, then you may rinse off the CHG soap.  Pat dry with a clean towel  Put on clean clothes/pajamas   If you choose to wear lotion, please use ONLY the CHG-compatible lotions that are listed below.  Additional instructions for the day of surgery: DO NOT APPLY any lotions, deodorants, cologne, or perfumes.   Do not bring valuables to the  hospital. [facility] is not responsible for any belongings/valuables. Do not wear nail polish, gel polish, artificial nails, or any other type of covering on natural nails (fingers and toes) Do not wear jewelry or makeup Put on clean/comfortable clothes.  Please brush your teeth.  Ask your nurse before applying any prescription medications to the skin.     CHG Compatible Lotions   Aveeno Moisturizing lotion  Cetaphil Moisturizing Cream  Cetaphil Moisturizing Lotion  Clairol Herbal Essence Moisturizing Lotion, Dry Skin  Clairol Herbal Essence Moisturizing Lotion, Extra Dry Skin  Clairol Herbal Essence Moisturizing Lotion, Normal Skin  Curel Age Defying Therapeutic Moisturizing Lotion with Alpha Hydroxy  Curel Extreme Care Body Lotion  Curel Soothing Hands Moisturizing Hand Lotion  Curel Therapeutic Moisturizing Cream, Fragrance-Free  Curel Therapeutic Moisturizing Lotion, Fragrance-Free  Curel Therapeutic Moisturizing Lotion, Original Formula  Eucerin Daily  Replenishing Lotion  Eucerin Dry Skin Therapy Plus Alpha Hydroxy Crme  Eucerin Dry Skin Therapy Plus Alpha Hydroxy Lotion  Eucerin Original Crme  Eucerin Original Lotion  Eucerin Plus Crme Eucerin Plus Lotion  Eucerin TriLipid Replenishing Lotion  Keri Anti-Bacterial Hand Lotion  Keri Deep Conditioning Original Lotion Dry Skin Formula Softly Scented  Keri Deep Conditioning Original Lotion, Fragrance Free Sensitive Skin Formula  Keri Lotion Fast Absorbing Fragrance Free Sensitive Skin Formula  Keri Lotion Fast Absorbing Softly Scented Dry Skin Formula  Keri Original Lotion  Keri Skin Renewal Lotion Keri Silky Smooth Lotion  Keri Silky Smooth Sensitive Skin Lotion  Nivea Body Creamy Conditioning Oil  Nivea Body Extra Enriched Lotion  Nivea Body Original Lotion  Nivea Body Sheer Moisturizing Lotion Nivea Crme  Nivea Skin Firming Lotion  NutraDerm 30 Skin Lotion  NutraDerm Skin Lotion  NutraDerm Therapeutic Skin  Cream  NutraDerm Therapeutic Skin Lotion  ProShield Protective Hand Cream  Provon moisturizing lotion  Please read over the following fact sheets that you were given.

## 2023-10-07 ENCOUNTER — Telehealth: Payer: Self-pay

## 2023-10-07 ENCOUNTER — Ambulatory Visit (HOSPITAL_COMMUNITY)
Admission: RE | Admit: 2023-10-07 | Discharge: 2023-10-07 | Disposition: A | Discharge status: Home / Self Care | Payer: Medicare HMO | Source: Ambulatory Visit | Attending: Orthopedic Surgery | Admitting: Orthopedic Surgery

## 2023-10-07 ENCOUNTER — Other Ambulatory Visit: Payer: Self-pay

## 2023-10-07 ENCOUNTER — Ambulatory Visit: Payer: Self-pay | Admitting: Orthopedic Surgery

## 2023-10-07 ENCOUNTER — Encounter (HOSPITAL_COMMUNITY)
Admission: RE | Admit: 2023-10-07 | Discharge: 2023-10-07 | Disposition: A | Discharge status: Home / Self Care | Payer: Medicare HMO | Source: Ambulatory Visit | Attending: Specialist | Admitting: Specialist

## 2023-10-07 ENCOUNTER — Encounter (HOSPITAL_COMMUNITY): Payer: Self-pay

## 2023-10-07 VITALS — BP 146/72 | HR 61 | Temp 98.4°F | Resp 19 | Ht 69.5 in | Wt 205.0 lb

## 2023-10-07 DIAGNOSIS — M5127 Other intervertebral disc displacement, lumbosacral region: Secondary | ICD-10-CM | POA: Diagnosis not present

## 2023-10-07 DIAGNOSIS — I2510 Atherosclerotic heart disease of native coronary artery without angina pectoris: Secondary | ICD-10-CM | POA: Diagnosis not present

## 2023-10-07 DIAGNOSIS — I110 Hypertensive heart disease with heart failure: Secondary | ICD-10-CM | POA: Diagnosis not present

## 2023-10-07 DIAGNOSIS — Z951 Presence of aortocoronary bypass graft: Secondary | ICD-10-CM | POA: Diagnosis not present

## 2023-10-07 DIAGNOSIS — Z01818 Encounter for other preprocedural examination: Secondary | ICD-10-CM | POA: Insufficient documentation

## 2023-10-07 DIAGNOSIS — Z8673 Personal history of transient ischemic attack (TIA), and cerebral infarction without residual deficits: Secondary | ICD-10-CM | POA: Insufficient documentation

## 2023-10-07 DIAGNOSIS — M5126 Other intervertebral disc displacement, lumbar region: Secondary | ICD-10-CM

## 2023-10-07 DIAGNOSIS — I252 Old myocardial infarction: Secondary | ICD-10-CM | POA: Insufficient documentation

## 2023-10-07 DIAGNOSIS — I739 Peripheral vascular disease, unspecified: Secondary | ICD-10-CM | POA: Diagnosis not present

## 2023-10-07 DIAGNOSIS — I5032 Chronic diastolic (congestive) heart failure: Secondary | ICD-10-CM | POA: Insufficient documentation

## 2023-10-07 DIAGNOSIS — M47816 Spondylosis without myelopathy or radiculopathy, lumbar region: Secondary | ICD-10-CM | POA: Diagnosis not present

## 2023-10-07 DIAGNOSIS — F17210 Nicotine dependence, cigarettes, uncomplicated: Secondary | ICD-10-CM | POA: Diagnosis not present

## 2023-10-07 DIAGNOSIS — H548 Legal blindness, as defined in USA: Secondary | ICD-10-CM | POA: Diagnosis not present

## 2023-10-07 DIAGNOSIS — J439 Emphysema, unspecified: Secondary | ICD-10-CM | POA: Diagnosis not present

## 2023-10-07 DIAGNOSIS — M4807 Spinal stenosis, lumbosacral region: Secondary | ICD-10-CM | POA: Diagnosis not present

## 2023-10-07 HISTORY — DX: Chronic obstructive pulmonary disease, unspecified: J44.9

## 2023-10-07 HISTORY — DX: Angina pectoris, unspecified: I20.9

## 2023-10-07 LAB — COMPREHENSIVE METABOLIC PANEL
ALT: 12 U/L (ref 0–44)
AST: 17 U/L (ref 15–41)
Albumin: 4.2 g/dL (ref 3.5–5.0)
Alkaline Phosphatase: 44 U/L (ref 38–126)
Anion gap: 12 (ref 5–15)
BUN: 16 mg/dL (ref 8–23)
CO2: 26 mmol/L (ref 22–32)
Calcium: 9.3 mg/dL (ref 8.9–10.3)
Chloride: 100 mmol/L (ref 98–111)
Creatinine, Ser: 1.01 mg/dL (ref 0.61–1.24)
GFR, Estimated: >60 mL/min (ref >60)
Glucose, Bld: 83 mg/dL (ref 70–99)
Potassium: 3.9 mmol/L (ref 3.5–5.1)
Sodium: 138 mmol/L (ref 135–145)
Total Bilirubin: 1.5 mg/dL — ABNORMAL HIGH (ref 0.0–1.2)
Total Protein: 7.7 g/dL (ref 6.5–8.1)

## 2023-10-07 LAB — CBC
HCT: 47.2 % (ref 39.0–52.0)
Hemoglobin: 14.9 g/dL (ref 13.0–17.0)
MCH: 27 pg (ref 26.0–34.0)
MCHC: 31.6 g/dL (ref 30.0–36.0)
MCV: 85.5 fL (ref 80.0–100.0)
Platelets: 214 10*3/uL (ref 150–400)
RBC: 5.52 MIL/uL (ref 4.22–5.81)
RDW: 20 % — ABNORMAL HIGH (ref 11.5–15.5)
WBC: 5.6 10*3/uL (ref 4.0–10.5)
nRBC: 0 % (ref 0.0–0.2)

## 2023-10-07 LAB — SURGICAL PCR SCREEN
MRSA, PCR: NEGATIVE
Staphylococcus aureus: NEGATIVE

## 2023-10-07 NOTE — Progress Notes (Addendum)
 PCP - [name] Pulmonologist - Dr. Sherene Sires Cardiologist - Dr. Clifton James  PPM/ICD - denies Device Orders - n/a Rep Notified - n/a  Chest x-ray - 06/08/23 EKG - 10/07/23 Stress Test - 10/06/22 ECHO - 06/03/23 Cardiac Cath - 10/01/17  Sleep Study - OSA+ but does not wear a CPAP   No DM  Last dose of GLP1 agonist-  n/a GLP1 instructions: n/a  Blood Thinner & Aspirin Instructions:  patient has not received instructions for Plavix or Aspirin at the time of his PAT appointment.  This nurse spoke with Cordelia Pen from Dr. Ermelinda Das office and Cordelia Pen was going to reach out to Dr. Clifton James for clearance and Plavix and Aspirin instructions.  Cordelia Pen stated that she will contact the patient.  The patient and his sister are also aware that they need to contact the office if they have not heard from the office by Friday 2/28.     ERAS Protcol - clears until 0430 PRE-SURGERY Ensure or G2-  Ensure as ordered  COVID TEST- n/a   Anesthesia review: yes, CHG, CAD, HTN, CVA, AAA  Patient denies shortness of breath, fever, cough and chest pain at PAT appointment   All instructions explained to the patient, with a verbal understanding of the material. Patient agrees to go over the instructions while at home for a better understanding. Patient also instructed to self quarantine after being tested for COVID-19. The opportunity to ask questions was provided.  Patient was seen by Shonna Chock during PAT appointment.

## 2023-10-07 NOTE — Telephone Encounter (Signed)
   Name: [name].  DOB: 22-Jul-1961  MRN: [id number]  Primary Cardiologist: Verne Carrow, MD   Preoperative team, please contact this patient and set up a phone call appointment for further preoperative risk assessment. Please obtain consent and complete medication review. Thank you for your help.  I confirm that guidance regarding antiplatelet and oral anticoagulation therapy has been completed and, if necessary, noted below.  Per Dr. Clifton James: "OK to hold ASA and Plavix for his procedure."  Per office protocol, if patient is without any new symptoms or concerns at the time of their virtual visit, he may hold Plavix for 5 days and Aspirin for 5-7 days prior to procedure. Please resume Plavix and Aspirin as soon as possible postprocedure, at the discretion of the surgeon.    I also confirmed the patient resides in the state of [place]. As per [place] [facility] [employer] telemedicine laws, the patient must reside in the state in which the provider is licensed.   Denyce Robert, NP 10/07/2023, 4:29 PM  [facility]

## 2023-10-07 NOTE — Progress Notes (Signed)
 Anesthesia APP Evaluation:  Case: [id number] Date/Time: 10/14/23 0715   Procedure: Microlumbar decompression L5-S1 - 3 C-Bed   Anesthesia type: General   Pre-op diagnosis: Herniated disc L5-S1   [facility] ROOM 18 / [facility] OR   Surgeons: Jene Every, MD       DISCUSSION:  Denied chest pain or nitroglycerin.  Smoker 2PPD, currently < 1/ day on Chantix and patch  Breathing at baseline. In wheelchair but can ambulate.   Few right posterior crackles mostly clear with deep breathing. Deminished anteriorly but clear  RRR no murmur noted.   EKG appears stable  Denied LE edema.   Last Plavix and ASA 10/07/23. Had not gotten instructions.    Feels at baseline. Using [employer] daily. Albuterol only as needed. No recent exacerbations. O2 at night as needed.   Drinks 2 beers/night. Denied hx of withdrawals.   Denied anesthesia complications.  LUE scratch from new puppy. Will monitor.   VS: BP (!) 146/72   Pulse 61   Temp 36.9 C   Resp 19   Ht 5' 9.5" (1.765 m)   Wt 93 kg   SpO2 94%   BMI 29.84 kg/m   PROVIDERS: Wanda Plump, MD   LABS: {CHL AN LABS REVIEWED:112001::"Labs reviewed: Acceptable for surgery."} (all labs ordered are listed, but only abnormal results are displayed)  Labs Reviewed  CBC - Abnormal; Notable for the following components:      Result Value   RDW 20.0 (*)    All other components within normal limits  SURGICAL PCR SCREEN  COMPREHENSIVE METABOLIC PANEL     IMAGES:   EKG:   CV:  Past Medical History:  Diagnosis Date   (HFpEF) heart failure with preserved ejection fraction (HCC)    Echo 6/22: EF 55-60, no RWMA, GR 3 DD, elevated LVEDP, normal RVSF, RVSP 53.2, mild to moderate AV sclerosis without stenosis, dilated aortic root (41 mm), ascending aorta 37 mm   AAA (abdominal aortic aneurysm) (HCC)    Abnormality of gait    Anginal pain (HCC)    Anxiety    CAD (coronary artery disease)    a. 10/2008 Inferolateral STEMI: 3VD w/ occluded  LCX (PTCA)-->CABG x 3 (LIMA->LAD, VG->OM, VG->PDA);  b. 06/2009 NSTEMI/PCI: VG->OM 100 (BMS);  c. 05/2013 native 3VD, 3/3 patent grafts. d. 06/2016: Inferior STEMI w/ occluded SVG-OM (DES placed). LIMA-LAD and SVG-PDA patent.   Chronic bronchitis (HCC)    Chronic diastolic CHF (congestive heart failure) (HCC)    a. 02/2015 Echo: EF 50-55%, distal septal, apical, inferobasal HK, mild LVH, mild MR, mildly dil LA.   Cocaine use    Colon polyps    a. 09/2015 colonscopy: multiple sessile polyps, path negative for high grade dysplasia.   COPD (chronic obstructive pulmonary disease) (HCC)    Depression    Emphysema (subcutaneous) (surgical) resulting from a procedure 10/2008   Bleb resected at time of 10/2008 CABG. Marketed emphysema noted on surgical report.   Esophagitis    a. 2017 EGD: esophagitis, duodenitis.   ETOH abuse    GERD (gastroesophageal reflux disease)    Hemianopia, homonymous, right    "can't see out the right side of either eye since stroke in 2016/pt description 09/23/2016   Hepatic steatosis    Hiatal hernia    History of nuclear stress test 08/2017   Nuclear stress test 1/19: EF 35, inf-lat and ant-lat, apical sept/apical scar; no ischemia; Intermediate Risk   Hyperlipidemia    Hypertension    Iron  deficiency anemia 2010   Morbid obesity (HCC)    Myocardial infarction [facility]) "several"   PAD (peripheral artery disease) (HCC)    Pancreatitis 06/2015   Sleep apnea    "don't have a mask" (09/23/2016)   Stroke (HCC) 02/2015   a. 02/2015 Carotid U/S: 1-39% bilat ICA stenosis; "left me blind" (09/23/2016)   Tobacco abuse    still smokes   Tubular adenoma of colon     Past Surgical History:  Procedure Laterality Date   ABDOMINAL AORTOGRAM W/LOWER EXTREMITY N/A 09/23/2016   Procedure: Abdominal Aortogram w/Lower Extremity;  Surgeon: Iran Ouch, MD;  [facility]: [facility] INVASIVE CV LAB;  Service: Cardiovascular;  Laterality: N/A;   ABDOMINAL AORTOGRAM W/LOWER EXTREMITY N/A  02/18/2022   Procedure: ABDOMINAL AORTOGRAM W/LOWER EXTREMITY;  Surgeon: Iran Ouch, MD;  [facility]: [facility] INVASIVE CV LAB;  Service: Cardiovascular;  Laterality: N/A;   CARDIAC CATHETERIZATION  11/07/2008   EF of 50-55% -- normal LV systolic function, acute inferolateral ST elevation MI,  PTCA of the circumflex artery -- Colleen Can. Deborah Chalk, [name]   CARDIAC CATHETERIZATION N/A 07/07/2016   Procedure: Left Heart Cath and Cors/Grafts Angiography;  Surgeon: Peter M Swaziland, MD;  [facility] INVASIVE CV LAB;  Service: Cardiovascular;  Laterality: N/A;   CARDIAC CATHETERIZATION N/A 07/07/2016   Procedure: Coronary Stent Intervention;  Surgeon: Peter M Swaziland, MD;  Location: [facility] INVASIVE CV LAB;  Service: Cardiovascular;  Laterality: N/A;   COLONOSCOPY     CORONARY ANGIOPLASTY     CORONARY ARTERY BYPASS GRAFT     "CABG X4"   LEFT HEART CATH AND CORS/GRAFTS ANGIOGRAPHY N/A 10/01/2017   Procedure: LEFT HEART CATH AND CORS/GRAFTS ANGIOGRAPHY;  Surgeon: Kathleene Hazel, MD;  [facility] INV[facility] CV [facility];  Service: Cardiovascular;  Laterality: N/A;   LEFT HEART CATHETERIZATION WITH CORONARY/GRAFT ANGIOGRAM N/A 05/15/2013   Procedure: LEFT HEART CATHETERIZATION WITH Isabel Caprice;  Surgeon: Peter M Swaziland, MD;  [facility] CATH LAB;  Service: Cardiovascular;  Laterality: N/A;   PERIPHERAL VASCULAR ATHERECTOMY  02/18/2022   Procedure: PERIPHERAL VASCULAR ATHERECTOMY;  Surgeon: Iran Ouch, MD;  [facility] CV LAB;  Service: Cardiovascular;;   PERIPHERAL VASCULAR INTERVENTION  09/23/2016   Procedure: Peripheral Vascular Intervention;  Surgeon: Iran Ouch, MD;  [facility] INV[facility] CV LAB;  Service: Cardiovascular;;  Lt. SFA    MEDICATIONS:  albuterol (VENTOLIN HFA) 108 (90 Base) MCG/ACT inhaler   aspirin EC 81 MG EC tablet   atorvastatin (LIPITOR) 80 MG tablet   B Complex-C (B-COMPLEX WITH VITAMIN C) tablet   Budeson-Glycopyrrol-Formoterol (BREZTRI AEROSPHERE)  160-9-4.8 MCG/ACT AERO   Cholecalciferol (VITAMIN D-3) 125 MCG (5000 UT) TABS   clopidogrel (PLAVIX) 75 MG tablet   Cyanocobalamin (VITAMIN B-12) 5000 MCG SUBL   dapagliflozin propanediol (FARXIGA) 5 MG TABS tablet   ezetimibe (ZETIA) 10 MG tablet   fenofibrate (TRICOR) 145 MG tablet   guaiFENesin (MUCINEX) 600 MG 12 hr tablet   isosorbide mononitrate (IMDUR) 60 MG 24 hr tablet   methocarbamol (ROBAXIN) 500 MG tablet   nicotine (NICODERM CQ - DOSED IN MG/24 HOURS) 21 mg/24hr patch   oxyCODONE-acetaminophen (PERCOCET) 10-325 MG tablet   OXYGEN   pantoprazole (PROTONIX) 40 MG tablet   PRESCRIPTION MEDICATION   spironolactone (ALDACTONE) 25 MG tablet   tamsulosin (FLOMAX) 0.4 MG CAPS capsule   traZODone (DESYREL) 100 MG tablet   valsartan (DIOVAN) 40 MG tablet   Varenicline Tartrate, Starter, (CHANTIX STARTING MONTH PAK) 0.5 MG X 11 & 1 MG  X 42 TBPK   No current facility-administered medications for this encounter.

## 2023-10-07 NOTE — Telephone Encounter (Signed)
 Pt scheduled for televisit on 10/12/23. Med rec and consent done

## 2023-10-07 NOTE — Telephone Encounter (Signed)
   Pre-operative Risk Assessment    Patient Name: [name].  DOB: 20-Jul-1961 MRN: [id number]   Date of last office visit: 05/14/23 Dr Clifton James Date of next office visit: None   Request for Surgical Clearance    Procedure:   Microlumbar decompression L5-S1  Date of Surgery:  Clearance 10/14/23                                Surgeon:  Dr Jene Every Surgeon's Group or Practice Name:  [facility] Phone number:  [phone] Fax number:  [phone]   Type of Clearance Requested:   - Medical  - Pharmacy:  Hold Aspirin and Clopidogrel (Plavix)     Type of Anesthesia:  Not Indicated   Additional requests/questions:    Signed, Sonni Barse   10/07/2023, 3:40 PM

## 2023-10-07 NOTE — Telephone Encounter (Signed)
 Office called stating that they need to know as soon as possible when pt is to stop Aspirin and Plavix. Please advise

## 2023-10-07 NOTE — H&P (Addendum)
 [name]. is an 63 y.o. male.   Chief Complaint: back and right leg pain HPI: Reported by patient. Reason for Visit: (normal) review of test results (lumbar MRI) Location (Lower Extremity): lower back pain ; leg pain on the right, , Severity: pain level 9/10 Timing: constant Quality: sharp; aching; burning; stabbing Associated Symptoms: numbness/tingling; weakness (RLE) Medications: helping a little; Robaxin needs RF/ Patient was prescribed Percocet by Dr. Ethelene Hal Notes: Reports ongoing symptoms, not only back pain, but also c/o RLE pain with associated numbness, tingling, weakness. Symptoms extend into the top of the foot. Worse standing and walking. better sitting and laying down. Gabapentin has not helped in the past and he does not tolerate lyrica. Has had multiple injections (SNRB and caudal injection) without relief.  Past Medical History:  Diagnosis Date   (HFpEF) heart failure with preserved ejection fraction (HCC)    Echo 6/22: EF 55-60, no RWMA, GR 3 DD, elevated LVEDP, normal RVSF, RVSP 53.2, mild to moderate AV sclerosis without stenosis, dilated aortic root (41 mm), ascending aorta 37 mm   AAA (abdominal aortic aneurysm) (HCC)    Abnormality of gait    Anxiety    CAD (coronary artery disease)    a. 10/2008 Inferolateral STEMI: 3VD w/ occluded LCX (PTCA)-->CABG x 3 (LIMA->LAD, VG->OM, VG->PDA);  b. 06/2009 NSTEMI/PCI: VG->OM 100 (BMS);  c. 05/2013 native 3VD, 3/3 patent grafts. d. 06/2016: Inferior STEMI w/ occluded SVG-OM (DES placed). LIMA-LAD and SVG-PDA patent.   Chronic bronchitis (HCC)    Chronic diastolic CHF (congestive heart failure) (HCC)    a. 02/2015 Echo: EF 50-55%, distal septal, apical, inferobasal HK, mild LVH, mild MR, mildly dil LA.   Cocaine use    Colon polyps    a. 09/2015 colonscopy: multiple sessile polyps, path negative for high grade dysplasia.   Depression    Emphysema (subcutaneous) (surgical) resulting from a procedure 10/2008   Bleb  resected at time of 10/2008 CABG. Marketed emphysema noted on surgical report.   Esophagitis    a. 2017 EGD: esophagitis, duodenitis.   ETOH abuse    GERD (gastroesophageal reflux disease)    Hemianopia, homonymous, right    "can't see out the right side of either eye since stroke in 2016/pt description 09/23/2016   Hepatic steatosis    Hiatal hernia    History of nuclear stress test 08/2017   Nuclear stress test 1/19: EF 35, inf-lat and ant-lat, apical sept/apical scar; no ischemia; Intermediate Risk   Hyperlipidemia    Hypertension    Iron deficiency anemia 2010   Morbid obesity (HCC)    Myocardial infarction [facility]) "several"   PAD (peripheral artery disease) (HCC)    Pancreatitis 06/2015   Sleep apnea    "don't have a mask" (09/23/2016)   Stroke (HCC) 02/2015   a. 02/2015 Carotid U/S: 1-39% bilat ICA stenosis; "left me blind" (09/23/2016)   Tobacco abuse    still smokes   Tubular adenoma of colon     Past Surgical History:  Procedure Laterality Date   ABDOMINAL AORTOGRAM W/LOWER EXTREMITY N/A 09/23/2016   Procedure: Abdominal Aortogram w/Lower Extremity;  Surgeon: Iran Ouch, MD;  Location: [facility] INVASIVE CV LAB;  Service: Cardiovascular;  Laterality: N/A;   ABDOMINAL AORTOGRAM W/LOWER EXTREMITY N/A 02/18/2022   Procedure: ABDOMINAL AORTOGRAM W/LOWER EXTREMITY;  Surgeon: Iran Ouch, MD;  Location: [facility] INVASIVE CV LAB;  Service: Cardiovascular;  Laterality: N/A;   CARDIAC CATHETERIZATION  11/07/2008   EF of 50-55% -- normal LV systolic  function, acute inferolateral ST elevation MI,  PTCA of the circumflex artery -- Colleen Can. Deborah Chalk, [name]   CARDIAC CATHETERIZATION N/A 07/07/2016   Procedure: Left Heart Cath and Cors/Grafts Angiography;  Surgeon: Peter M Swaziland, MD;  Location: [facility] INVASIVE CV LAB;  Service: Cardiovascular;  Laterality: N/A;   CARDIAC CATHETERIZATION N/A 07/07/2016   Procedure: Coronary Stent Intervention;  Surgeon: Peter M Swaziland, MD;  Location: [facility] INVASIVE CV  LAB;  Service: Cardiovascular;  Laterality: N/A;   CORONARY ANGIOPLASTY     CORONARY ARTERY BYPASS GRAFT     "CABG X4"   LEFT HEART CATH AND CORS/GRAFTS ANGIOGRAPHY N/A 10/01/2017   Procedure: LEFT HEART CATH AND CORS/GRAFTS ANGIOGRAPHY;  Surgeon: Kathleene Hazel, MD;  Location: [facility] INVASIVE CV LAB;  Service: Cardiovascular;  Laterality: N/A;   LEFT HEART CATHETERIZATION WITH CORONARY/GRAFT ANGIOGRAM N/A 05/15/2013   Procedure: LEFT HEART CATHETERIZATION WITH Isabel Caprice;  Surgeon: Peter M Swaziland, MD;  Location: [facility] CATH LAB;  Service: Cardiovascular;  Laterality: N/A;   PERIPHERAL VASCULAR ATHERECTOMY  02/18/2022   Procedure: PERIPHERAL VASCULAR ATHERECTOMY;  Surgeon: Iran Ouch, MD;  Location: [facility] INVASIVE CV LAB;  Service: Cardiovascular;;   PERIPHERAL VASCULAR INTERVENTION  09/23/2016   Procedure: Peripheral Vascular Intervention;  Surgeon: Iran Ouch, MD;  Location: [facility] INVASIVE CV LAB;  Service: Cardiovascular;;  Lt. SFA    Family History  Problem Relation Age of Onset   Heart disease Mother    Heart failure Mother    Diabetes Mother    Colon cancer Father        late 36s   Prostate cancer Neg Hx    Social History:  reports that he has been smoking cigarettes. He started smoking about 49 years ago. He has a 73.7 pack-year smoking history. He has been exposed to tobacco smoke. He has never used smokeless tobacco. He reports current alcohol use of about 3.0 standard drinks of alcohol per week. He reports current drug use. Drug: Marijuana.  Allergies:  Allergies  Allergen Reactions   Metformin And Related Diarrhea and Other (See Comments)    Severe diarrhea   Wellbutrin [Bupropion] Nausea Only    Current medications: atorvastatin 80 mg tablet betamethasone, augmented 0.05 % topical cream buPROPion HCL 150 mg tablet,12 hr sustained-release(smoking deterrent) diclofenac 1 % topical gel ezetimibe 10 mg tablet Farxiga 5 mg tablet fenofibrate  nanocrystallized 145 mg tablet fluticasone propionate 0.05 % topical cream furosemide 20 mg tablet isosorbide mononitrate ER 60 mg tablet,extended release 24 hr lisinopriL 10 mg tablet metFORMIN 500 mg tablet methocarbamoL 500 mg tablet metoprolol tartrate 25 mg tablet nitroglycerin 0.4 mg sublingual tablet pantoprazole 40 mg tablet,delayed release Percocet 10 mg-325 mg tablet potassium chloride ER 10 mEq tablet,extended release Repatha SureClick 140 mg/mL subcutaneous pen injector Spiriva Respimat 1.25 mcg/actuation solution for inhalation spironolactone 25 mg tablet Symbicort 160 mcg-4.5 mcg/actuation HFA aerosol inhaler tamsulosin 0.4 mg capsule traZODone 100 mg tablet valsartan 40 mg tablet  Review of Systems  Constitutional: Negative.   HENT: Negative.    Eyes: Negative.   Respiratory: Negative.    Cardiovascular: Negative.   Gastrointestinal: Negative.   Endocrine: Negative.   Genitourinary: Negative.   Musculoskeletal:  Positive for back pain and gait problem.  Neurological:  Positive for weakness and numbness.  Hematological: Negative.   Psychiatric/Behavioral: Negative.      There were no vitals taken for this visit. Physical Exam Constitutional:      Appearance: Normal appearance.  HENT:     Head: Normocephalic  and atraumatic.     Right Ear: External ear normal.     Left Ear: External ear normal.     Nose: Nose normal.     Mouth/Throat:     Pharynx: Oropharynx is clear.  Eyes:     Conjunctiva/sclera: Conjunctivae normal.  Cardiovascular:     Rate and Rhythm: Normal rate and regular rhythm.     Pulses: Normal pulses.     Heart sounds: Normal heart sounds.  Pulmonary:     Effort: Pulmonary effort is normal.     Breath sounds: Normal breath sounds.  Abdominal:     General: Bowel sounds are normal.  Musculoskeletal:     Cervical back: Normal range of motion.     Comments: Patient is awake, alert, and oriented 3. Well-nourished and well-developed. In  distress due to pain. Seated and comfortable for exam. Antalgic gait.  On examination of the lumbar spine, tender to palpation through the spinous processes and paraspinous musculature on the right. Nontender over the greater trochanter of the hips. Tender right buttock. Decreased flexion and extension lumbar spine. Positive seated SLR on the right, negative on the left. Trace weakness R EHL. No groin pain with rotation of the hips bilaterally. Patellar and Achilles reflexes 2+. No clonus present, negative Babinski. Sensation intact distally. No calf pain or sign of DVT.  Skin:    General: Skin is warm and dry.  Neurological:     Mental Status: He is alert.      Updated MRI images and report reviewed today with small to moderate right/central extrusion L5-S1 with mild to moderate caudal migration, with mild impingement/displacement of descending S1 root on the right, extrusion slightly decreased in size and right/central zone narrowing improved. Moderate to severe left and mild to moderate right foraminal narrowing with mild impingement on exiting left L5 root, mildly progressed. Mild to moderate right sided L4-5 foraminal narrowing, progressed.   Assessment/Plan Impression: chronic back and RLE pain refractory to multiple injections, HEP, activity modifications, narcotic medications, relative rest, with spinal stenosis on MRI  Plan: Discussed relevant anatomy and etiology of symptoms. Discussed the importance of activity modifications, core strengthening and core motion to prevent exacerbations. ADL sheet reviewed and given. We discussed options. He has failed injection therapy thus far (SNRB L4-5 and L5-S1 right and caudal epidural). We discussed possible need for surgical decompression. Previously with only back pain surgery was not recommended. I will discuss his case with Dr. Shelle Iron and have him review to see if he would predictably benefit from decompression. We will contact the patient later  this week.  ABI's obtained and within normal limits. Recently seen by vascular and was deemed stable from their standpoint. After discussion with Dr Shelle Iron patient would like to proceed with microlumbar decompression L5-S1. Risks and alternatives reviewed, all questions answered, desires to proceed.  Dorothy Spark, PA-C for Dr Shelle Iron 10/07/2023, 11:39 AM  EHL 4/5, PF 4/5

## 2023-10-07 NOTE — Telephone Encounter (Signed)
 Pt scheduled for televisit on 10/12/23. Med rec and consent done    Patient Consent for Virtual Visit        [name]. has provided verbal consent on 10/07/2023 for a virtual visit (video or telephone).   CONSENT FOR VIRTUAL VISIT FOR:  [name].  By participating in this virtual visit I agree to the following:  I hereby voluntarily request, consent and authorize [facility] and its employed or contracted physicians, physician assistants, nurse practitioners or other licensed health care professionals (the Practitioner), to provide me with telemedicine health care services (the "Services") as deemed necessary by the treating Practitioner. I acknowledge and consent to receive the Services by the Practitioner via telemedicine. I understand that the telemedicine visit will involve communicating with the Practitioner through live audiovisual communication technology and the disclosure of certain medical information by electronic transmission. I acknowledge that I have been given the opportunity to request an in-person assessment or other available alternative prior to the telemedicine visit and am voluntarily participating in the telemedicine visit.  I understand that I have the right to withhold or withdraw my consent to the use of telemedicine in the course of my care at any time, without affecting my right to future care or treatment, and that the Practitioner or I may terminate the telemedicine visit at any time. I understand that I have the right to inspect all information obtained and/or recorded in the course of the telemedicine visit and may receive copies of available information for a reasonable fee.  I understand that some of the potential risks of receiving the Services via telemedicine include:  Delay or interruption in medical evaluation due to technological equipment failure or disruption; Information transmitted may not be sufficient (e.g. poor resolution of  images) to allow for appropriate medical decision making by the Practitioner; and/or  In rare instances, security protocols could fail, causing a breach of personal health information.  Furthermore, I acknowledge that it is my responsibility to provide information about my medical history, conditions and care that is complete and accurate to the best of my ability. I acknowledge that Practitioner's advice, recommendations, and/or decision may be based on factors not within their control, such as incomplete or inaccurate data provided by me or distortions of diagnostic images or specimens that may result from electronic transmissions. I understand that the practice of medicine is not an exact science and that Practitioner makes no warranties or guarantees regarding treatment outcomes. I acknowledge that a copy of this consent can be made available to me via my patient portal [facility] MyChart), or I can request a printed copy by calling the office of [facility].    I understand that my insurance will be billed for this visit.   I have read or had this consent read to me. I understand the contents of this consent, which adequately explains the benefits and risks of the Services being provided via telemedicine.  I have been provided ample opportunity to ask questions regarding this consent and the Services and have had my questions answered to my satisfaction. I give my informed consent for the services to be provided through the use of telemedicine in my medical care

## 2023-10-07 NOTE — H&P (Deleted)
   The note originally documented on this encounter has been moved the the encounter in which it belongs.

## 2023-10-08 ENCOUNTER — Ambulatory Visit: Payer: Medicare HMO | Attending: Nurse Practitioner | Admitting: Emergency Medicine

## 2023-10-08 ENCOUNTER — Telehealth: Payer: Self-pay | Admitting: Cardiovascular Disease

## 2023-10-08 DIAGNOSIS — Z01810 Encounter for preprocedural cardiovascular examination: Secondary | ICD-10-CM

## 2023-10-08 NOTE — Progress Notes (Signed)
 Virtual Visit via Telephone Note   Because of [employer]. co-morbid illnesses, he is at least at moderate risk for complications without adequate follow up.  This format is felt to be most appropriate for this patient at this time.  Due to technical limitations with video connection Web designer), today's appointment will be conducted as an audio only telehealth visit, and [employer]. verbally agreed to proceed in this manner.   All issues noted in this document were discussed and addressed.  No physical exam could be performed with this format.  Evaluation Performed:  Preoperative cardiovascular risk assessment _____________   Date:  10/08/2023   Patient ID:  Kenneth Hare., DOB [birth year], MRN [id number] Patient Location:  Home Provider location:   Office  Primary Care Provider:  Wanda Plump, MD Primary Cardiologist:  Verne Carrow, MD  Chief Complaint / Patient Profile   63 y.o. y/o male with a h/o CAD s/p CABG (LIMA to LAD, SVG to Circumflex, SVG to PDA) 2010, lateral STEMI 2017 with BMS to SVG to OM x 1, HLD, HTN, HFpEF, DM type II, OSA (on CPAP), IDA, CVA 2016, AAA  who is pending Microlumbar decompression L5-S1 with Dr. Shelle Iron and presents today for telephonic preoperative cardiovascular risk assessment.  History of Present Illness    Kenneth Livolsi. is a 63 y.o. male who presents via audio/video conferencing for a telehealth visit today.  Pt was last seen in cardiology clinic on 05/14/2023 by Dr. Clifton James.  At that time Kenneth Hare. was doing well.  The patient is now pending procedure as outlined above. Since his last visit, he  denies chest pain, shortness of breath, lower extremity edema, fatigue, palpitations, diaphoresis, weakness, presyncope, syncope, orthopnea, and PND.  Past Medical History    Past Medical History:  Diagnosis Date   (HFpEF) heart failure with preserved ejection fraction (HCC)    Echo 6/22: EF 55-60, no RWMA, GR  3 DD, elevated LVEDP, normal RVSF, RVSP 53.2, mild to moderate AV sclerosis without stenosis, dilated aortic root (41 mm), ascending aorta 37 mm   AAA (abdominal aortic aneurysm) (HCC)    Abnormality of gait    Anginal pain (HCC)    Anxiety    CAD (coronary artery disease)    a. 10/2008 Inferolateral STEMI: 3VD w/ occluded LCX (PTCA)-->CABG x 3 (LIMA->LAD, VG->OM, VG->PDA);  b. 06/2009 NSTEMI/PCI: VG->OM 100 (BMS);  c. 05/2013 native 3VD, 3/3 patent grafts. d. 06/2016: Inferior STEMI w/ occluded SVG-OM (DES placed). LIMA-LAD and SVG-PDA patent.   Chronic bronchitis (HCC)    Chronic diastolic CHF (congestive heart failure) (HCC)    a. 02/2015 Echo: EF 50-55%, distal septal, apical, inferobasal HK, mild LVH, mild MR, mildly dil LA.   Cocaine use    Colon polyps    a. 09/2015 colonscopy: multiple sessile polyps, path negative for high grade dysplasia.   COPD (chronic obstructive pulmonary disease) (HCC)    Depression    Emphysema (subcutaneous) (surgical) resulting from a procedure 10/2008   Bleb resected at time of 10/2008 CABG. Marketed emphysema noted on surgical report.   Esophagitis    a. 2017 EGD: esophagitis, duodenitis.   ETOH abuse    GERD (gastroesophageal reflux disease)    Hemianopia, homonymous, right    "can't see out the right side of either eye since stroke in 2016/pt description 09/23/2016   Hepatic steatosis    Hiatal hernia    History of nuclear stress test 08/2017  Nuclear stress test 1/19: EF 35, inf-lat and ant-lat, apical sept/apical scar; no ischemia; Intermediate Risk   Hyperlipidemia    Hypertension    Iron deficiency anemia 2010   Morbid obesity (HCC)    Myocardial infarction [facility]) "several"   PAD (peripheral artery disease) (HCC)    Pancreatitis 06/2015   Sleep apnea    "don't have a mask" (09/23/2016)   Stroke (HCC) 02/2015   a. 02/2015 Carotid U/S: 1-39% bilat ICA stenosis; "left me blind" (09/23/2016)   Tobacco abuse    still smokes   Tubular adenoma of  colon    Past Surgical History:  Procedure Laterality Date   ABDOMINAL AORTOGRAM W/LOWER EXTREMITY N/A 09/23/2016   Procedure: Abdominal Aortogram w/Lower Extremity;  Surgeon: Iran Ouch, MD;  Location: [facility] INVASIVE CV LAB;  Service: Cardiovascular;  Laterality: N/A;   ABDOMINAL AORTOGRAM W/LOWER EXTREMITY N/A 02/18/2022   Procedure: ABDOMINAL AORTOGRAM W/LOWER EXTREMITY;  Surgeon: Iran Ouch, MD;  Location: [facility] INVASIVE CV LAB;  Service: Cardiovascular;  Laterality: N/A;   CARDIAC CATHETERIZATION  11/07/2008   EF of 50-55% -- normal LV systolic function, acute inferolateral ST elevation MI,  PTCA of the circumflex artery -- Colleen Can. Deborah Chalk, [name]   CARDIAC CATHETERIZATION N/A 07/07/2016   Procedure: Left Heart Cath and Cors/Grafts Angiography;  Surgeon: Peter M Swaziland, MD;  [facility] INVASIVE CV LAB;  Service: Cardiovascular;  Laterality: N/A;   CARDIAC CATHETERIZATION N/A 07/07/2016   Procedure: Coronary Stent Intervention;  Surgeon: Peter M Swaziland, MD;  Location: [facility] INVASIVE CV LAB;  Service: Cardiovascular;  Laterality: N/A;   COLONOSCOPY     CORONARY ANGIOPLASTY     CORONARY ARTERY BYPASS GRAFT     "CABG X4"   LEFT HEART CATH AND CORS/GRAFTS ANGIOGRAPHY N/A 10/01/2017   Procedure: LEFT HEART CATH AND CORS/GRAFTS ANGIOGRAPHY;  Surgeon: Kathleene Hazel, MD;  Location: [facility] INVASIVE CV LAB;  Service: Cardiovascular;  Laterality: N/A;   LEFT HEART CATHETERIZATION WITH CORONARY/GRAFT ANGIOGRAM N/A 05/15/2013   Procedure: LEFT HEART CATHETERIZATION WITH Isabel Caprice;  Surgeon: Peter M Swaziland, MD;  Location: [facility] CATH LAB;  Service: Cardiovascular;  Laterality: N/A;   PERIPHERAL VASCULAR ATHERECTOMY  02/18/2022   Procedure: PERIPHERAL VASCULAR ATHERECTOMY;  Surgeon: Iran Ouch, MD;  Location: [facility] INVASIVE CV LAB;  Service: Cardiovascular;;   PERIPHERAL VASCULAR INTERVENTION  09/23/2016   Procedure: Peripheral Vascular Intervention;  Surgeon: Iran Ouch, MD;  Location: [facility] INVASIVE CV LAB;  Service: Cardiovascular;;  Lt. SFA    Allergies  Allergies  Allergen Reactions   Metformin And Related Diarrhea and Other (See Comments)    Severe diarrhea   Wellbutrin [Bupropion] Nausea Only    Home Medications    Prior to Admission medications   Medication Sig Start Date End Date Taking? Authorizing Provider  albuterol (VENTOLIN HFA) 108 (90 Base) MCG/ACT inhaler Inhale 2 puffs into the lungs every 6 (six) hours as needed for wheezing or shortness of breath. 11/23/22   Gaston Islam., NP  aspirin EC 81 MG EC tablet Take 1 tablet (81 mg total) by mouth daily with breakfast. 11/28/18   Shon Hale, MD  atorvastatin (LIPITOR) 80 MG tablet Take 1 tablet (80 mg total) by mouth daily. 01/07/23   Kathleene Hazel, MD  B Complex-C (B-COMPLEX WITH VITAMIN C) tablet Take 1 tablet by mouth daily.    [provider]  Budeson-Glycopyrrol-Formoterol (BREZTRI AEROSPHERE) 160-9-4.8 MCG/ACT AERO Inhale 2 puffs into the lungs in the morning and at  bedtime. 08/03/23   Wanda Plump, MD  Cholecalciferol (VITAMIN D-3) 125 MCG (5000 UT) TABS Take 5,000 Units by mouth daily.    [provider]  clopidogrel (PLAVIX) 75 MG tablet TAKE 1 TABLET EVERY DAY 09/24/23   Kathleene Hazel, MD  Cyanocobalamin (VITAMIN B-12) 5000 MCG SUBL Place 5,000 mcg under the tongue daily.    [provider]  dapagliflozin propanediol (FARXIGA) 5 MG TABS tablet Take 1 tablet (5 mg total) by mouth daily before breakfast. 05/03/23   Wanda Plump, MD  ezetimibe (ZETIA) 10 MG tablet TAKE 1 TABLET EVERY DAY (NEED MD APPOINTMENT FOR REFILLS) 05/31/23   Kathleene Hazel, MD  fenofibrate (TRICOR) 145 MG tablet Take 1 tablet (145 mg total) by mouth daily. 07/10/23   Wanda Plump, MD  guaiFENesin (MUCINEX) 600 MG 12 hr tablet Take 600 mg by mouth 2 (two) times daily as needed for cough.    [provider]  isosorbide mononitrate (IMDUR) 60 MG  24 hr tablet TAKE 1 AND 1/2 TABLETS EVERY DAY 09/02/23   Kathleene Hazel, MD  methocarbamol (ROBAXIN) 500 MG tablet Take by mouth daily as needed for muscle spasms. 07/01/23   [provider]  nicotine (NICODERM CQ - DOSED IN MG/24 HOURS) 21 mg/24hr patch Place 1 patch (21 mg total) onto the skin daily. 08/17/23   Eckard, Tammy, RPH-CPP  oxyCODONE-acetaminophen (PERCOCET) 10-325 MG tablet Take 2 tablets by mouth every 6 (six) hours as needed for pain.    [provider]  OXYGEN Inhale 2-4 L/min into the lungs continuous.    [provider]  pantoprazole (PROTONIX) 40 MG tablet TAKE 1 TABLET TWICE DAILY (NEED MD APPOINTMENT FOR REFILLS) 05/31/23   Kathleene Hazel, MD  PRESCRIPTION MEDICATION CPAP- At bedtime    [provider]  spironolactone (ALDACTONE) 25 MG tablet TAKE 1/2 TABLET EVERY DAY Patient not taking: Reported on 10/07/2023 09/02/23   Kathleene Hazel, MD  tamsulosin (FLOMAX) 0.4 MG CAPS capsule Take 1 capsule (0.4 mg total) by mouth in the morning and at bedtime. 06/10/23   Wanda Plump, MD  traZODone (DESYREL) 100 MG tablet Take 2 tablets (200 mg total) by mouth at bedtime. 08/09/23   Wanda Plump, MD  valsartan (DIOVAN) 40 MG tablet Take 1 tablet (40 mg total) by mouth daily. 06/10/23   Wanda Plump, MD  Varenicline Tartrate, Starter, (CHANTIX STARTING MONTH PAK) 0.5 MG X 11 & 1 MG X 42 TBPK Take according to starter pack directions *take with food* 07/30/23   Wanda Plump, MD    Physical Exam    Vital Signs:  Kenneth Hare. does not have vital signs available for review today.  Given telephonic nature of communication, physical exam is limited. AAOx3. NAD. Normal affect.  Speech and respirations are unlabored.  Accessory Clinical Findings    None  Assessment & Plan    1.  Preoperative Cardiovascular Risk Assessment: According to the Revised Cardiac Risk Index (RCRI), his Perioperative Risk of Major Cardiac Event is  (%): 11. His Functional Capacity in METs is: 5.72 according to the Duke Activity Status Index (DASI). Therefore, based on ACC/AHA guidelines, patient would be at acceptable risk for the planned procedure without further cardiovascular testing.  The patient was advised that if he develops new symptoms prior to surgery to contact our office to arrange for a follow-up visit, and he verbalized understanding.  Per office protocol, if patient is without any new  symptoms or concerns at the time of their virtual visit, he may hold Plavix for 5 days and Aspirin for 5-7 days prior to procedure. Please resume Plavix and Aspirin as soon as possible postprocedure, at the discretion of the surgeon.    A copy of this note will be routed to requesting surgeon.  Time:   Today, I have spent 8 minutes with the patient with telehealth technology discussing medical history, symptoms, and management plan.     Denyce Robert, NP  10/08/2023, 2:14 PM

## 2023-10-08 NOTE — Telephone Encounter (Signed)
 Sister is calling because pt is suppose to be off blood thinners for a week before his surgery on 10/14/23. He has Pre-Tele Appt on 10/11/23 but they need to know for sure from our office that he can stop the blood thinners.

## 2023-10-08 NOTE — Telephone Encounter (Signed)
 Called patient to inform him and sister that we would need to change his appt form 10/12/23 to 10/08/23 due that patient nneds to hold medication before his surgery. Patient understood and will be waiting for the call today.

## 2023-10-08 NOTE — Telephone Encounter (Signed)
 I will reach out to the preop APP's about the pt and med hold. Procedure is set for 10/14/23, with tele preop appt set for 10/12/23. Pt needs to hold meds 5-7 days. 10/12/23 is not in time for med hold. I will d/w preop APP to see if want to add on today.

## 2023-10-08 NOTE — Anesthesia Preprocedure Evaluation (Addendum)
 Anesthesia Evaluation  Patient identified by MRN, date of birth, ID band Patient awake    Reviewed: Allergy & Precautions, NPO status , Patient's Chart, lab work & pertinent test results  History of Anesthesia Complications Negative for: history of anesthetic complications  Airway Mallampati: III  TM Distance: >3 FB Neck ROM: Limited   Comment: Facial hair Dental  (+) Dental Advisory Given, Missing, Poor Dentition   Pulmonary sleep apnea (does not use CPAP) , COPD (nocturnal O2 1 LPM PRN, no recent use),  COPD inhaler, Current Smoker and Patient abstained from smoking.   Pulmonary exam normal breath sounds clear to auscultation       Cardiovascular hypertension (ISMN, spironolactone, valsartan), Pt. on medications (-) angina + CAD, + Past MI, + CABG, + Peripheral Vascular Disease and +CHF  + dysrhythmias (PVCs, RBBB) + Valvular Problems/Murmurs  Rhythm:Regular Rate:Normal  AAA, HLD  Echo 06/03/23: IMPRESSIONS  1. Left ventricular ejection fraction, by estimation, is 60 to 65%. The  left ventricle has normal function. The left ventricle has no regional  wall motion abnormalities. The left ventricular internal cavity size was  mildly dilated. There is severe  asymmetric left ventricular hypertrophy of the septal segment. Left  ventricular diastolic parameters are consistent with Grade II diastolic  dysfunction (pseudonormalization).  2. Right ventricular systolic function is normal. The right ventricular  size is moderately enlarged. Tricuspid regurgitation signal is inadequate  for assessing PA pressure.  3. Left atrial size was severely dilated.  4. Right atrial size was severely dilated.  5. The mitral valve is degenerative. Mild mitral valve regurgitation. No  evidence of mitral stenosis.  6. The aortic valve is tricuspid. There is moderate calcification of the  aortic valve. There is mild thickening of the aortic  valve. Aortic valve  regurgitation is not visualized. Aortic valve sclerosis/calcification is  present, without any evidence of  aortic stenosis.  7. Aortic dilatation noted. There is mild dilatation of the aortic root,  measuring 44 mm.  8. The inferior vena cava is dilated in size with >50% respiratory  variability, suggesting right atrial pressure of 8 mmHg.    Nuclear stress test 10/06/22:   Findings are consistent with infarction. The study is high risk. 1. Fixed anteroseptal, apical anterior, and apex perfusion defect with abnormal wall motion consistent with infarct 2. Fixed inferolateral perfusion defect with abnormal wall motion consistent with infarct 3. Severe LV systolic dysfunction (EF 28%) 4. High risk study due to severe LV systolic dysfunction.     AAA Duplex 04/14/2023: Summary:  Abdominal Aorta: There is evidence of abnormal dilatation of the mid  Abdominal aorta. The largest aortic measurement is 3.3 cm.      Neuro/Psych neg Seizures PSYCHIATRIC DISORDERS Anxiety Depression    CVA (right-sided weakness, vision loss), Residual Symptoms    GI/Hepatic hiatal hernia,GERD  Medicated,,(+)     substance abuse  alcohol use and cocaine useHepatic steatosis   Endo/Other  diabetes, Type 2    Renal/GU negative Renal ROS     Musculoskeletal   Abdominal   Peds  Hematology  (+) Blood dyscrasia, anemia Lab Results      Component                Value               Date                      WBC  5.6                 10/07/2023                HGB                      14.9                10/07/2023                HCT                      47.2                10/07/2023                MCV                      85.5                10/07/2023                PLT                      214                 10/07/2023              Anesthesia Other Findings 63 y.o. y/o male with a h/o smoking, HTN, CAD (anterior MI, s/p LAD stent 12/07/00; LCX stent 11/14/01;  inferolateral STEMI, s/p thrombectomy/PTCA pLCX 11/07/08->CABG: LIMA-LAD, SVG-OM, SVG-PDA 11/07/08;  SVG-OM stent 06/23/09 & DES 07/07/16), chronic diastolic CHF, PAD (left SFA atherectomy/stent 09/23/16; right SFA atherectomy/DCB angioplasty 02/18/22), AAA, HLD, COPD/Emphysema (bleb resection at time of CABG), OSA (does not use CPAP), CVA (12/04/06; 02/2015 with late presentation homonymous hemianopsia; 11/26/18), GERD, hiatal hernia, hepatic steatosis, pancreatitis (2016). Prior cocaine and marijuana use. He reported drinking 2 beers/night. He is legally blind.  Last Plavix:  Last Farxiga:  Reproductive/Obstetrics                             Anesthesia Physical Anesthesia Plan  ASA: 3  Anesthesia Plan: General   Post-op Pain Management: Tylenol PO (pre-op)*   Induction: Intravenous  PONV Risk Score and Plan: 1 and Ondansetron, Dexamethasone and Treatment may vary due to age or medical condition  Airway Management Planned: Oral ETT and Video Laryngoscope Planned  Additional Equipment:   Intra-op Plan:   Post-operative Plan: Extubation in OR  Informed Consent: I have reviewed the patients History and Physical, chart, labs and discussed the procedure including the risks, benefits and alternatives for the proposed anesthesia with the patient or authorized representative who has indicated his/her understanding and acceptance.     Dental advisory given  Plan Discussed with: CRNA and Anesthesiologist  Anesthesia Plan Comments: (PAT note written 10/08/2023 by Shonna Chock, PA-C.  Risks of general anesthesia discussed including, but not limited to, sore throat, hoarse voice, chipped/damaged teeth, injury to vocal cords, nausea and vomiting, allergic reactions, lung infection, heart attack, stroke, and death. All questions answered. )       Anesthesia Quick Evaluation

## 2023-10-12 ENCOUNTER — Ambulatory Visit: Payer: Medicare HMO

## 2023-10-14 ENCOUNTER — Ambulatory Visit (HOSPITAL_COMMUNITY): Payer: Self-pay | Admitting: Physician Assistant

## 2023-10-14 ENCOUNTER — Ambulatory Visit (HOSPITAL_COMMUNITY)
Admission: RE | Admit: 2023-10-14 | Discharge: 2023-10-15 | Disposition: A | Discharge status: Home / Self Care | Payer: Medicare HMO | Attending: Specialist | Admitting: Specialist

## 2023-10-14 ENCOUNTER — Ambulatory Visit (HOSPITAL_BASED_OUTPATIENT_CLINIC_OR_DEPARTMENT_OTHER): Payer: Self-pay | Admitting: Anesthesiology

## 2023-10-14 ENCOUNTER — Encounter (HOSPITAL_COMMUNITY)
Admission: RE | Disposition: A | Discharge status: Home / Self Care | Payer: Self-pay | Source: Home / Self Care | Attending: Specialist

## 2023-10-14 ENCOUNTER — Other Ambulatory Visit: Payer: Self-pay

## 2023-10-14 ENCOUNTER — Ambulatory Visit (HOSPITAL_COMMUNITY)

## 2023-10-14 DIAGNOSIS — M48062 Spinal stenosis, lumbar region with neurogenic claudication: Secondary | ICD-10-CM | POA: Diagnosis not present

## 2023-10-14 DIAGNOSIS — M5127 Other intervertebral disc displacement, lumbosacral region: Secondary | ICD-10-CM | POA: Diagnosis not present

## 2023-10-14 DIAGNOSIS — M47817 Spondylosis without myelopathy or radiculopathy, lumbosacral region: Secondary | ICD-10-CM | POA: Diagnosis not present

## 2023-10-14 DIAGNOSIS — I110 Hypertensive heart disease with heart failure: Secondary | ICD-10-CM | POA: Insufficient documentation

## 2023-10-14 DIAGNOSIS — F17210 Nicotine dependence, cigarettes, uncomplicated: Secondary | ICD-10-CM | POA: Insufficient documentation

## 2023-10-14 DIAGNOSIS — Z9981 Dependence on supplemental oxygen: Secondary | ICD-10-CM | POA: Insufficient documentation

## 2023-10-14 DIAGNOSIS — Z79899 Other long term (current) drug therapy: Secondary | ICD-10-CM | POA: Insufficient documentation

## 2023-10-14 DIAGNOSIS — K219 Gastro-esophageal reflux disease without esophagitis: Secondary | ICD-10-CM | POA: Diagnosis not present

## 2023-10-14 DIAGNOSIS — I493 Ventricular premature depolarization: Secondary | ICD-10-CM | POA: Insufficient documentation

## 2023-10-14 DIAGNOSIS — M4807 Spinal stenosis, lumbosacral region: Secondary | ICD-10-CM | POA: Diagnosis not present

## 2023-10-14 DIAGNOSIS — E785 Hyperlipidemia, unspecified: Secondary | ICD-10-CM | POA: Diagnosis not present

## 2023-10-14 DIAGNOSIS — E1151 Type 2 diabetes mellitus with diabetic peripheral angiopathy without gangrene: Secondary | ICD-10-CM | POA: Insufficient documentation

## 2023-10-14 DIAGNOSIS — I639 Cerebral infarction, unspecified: Secondary | ICD-10-CM

## 2023-10-14 DIAGNOSIS — M5126 Other intervertebral disc displacement, lumbar region: Secondary | ICD-10-CM | POA: Diagnosis present

## 2023-10-14 DIAGNOSIS — I509 Heart failure, unspecified: Secondary | ICD-10-CM

## 2023-10-14 DIAGNOSIS — I714 Abdominal aortic aneurysm, without rupture, unspecified: Secondary | ICD-10-CM | POA: Insufficient documentation

## 2023-10-14 DIAGNOSIS — Z01818 Encounter for other preprocedural examination: Secondary | ICD-10-CM

## 2023-10-14 DIAGNOSIS — F418 Other specified anxiety disorders: Secondary | ICD-10-CM | POA: Diagnosis not present

## 2023-10-14 DIAGNOSIS — I5031 Acute diastolic (congestive) heart failure: Secondary | ICD-10-CM | POA: Diagnosis not present

## 2023-10-14 DIAGNOSIS — M48061 Spinal stenosis, lumbar region without neurogenic claudication: Secondary | ICD-10-CM | POA: Diagnosis not present

## 2023-10-14 DIAGNOSIS — I340 Nonrheumatic mitral (valve) insufficiency: Secondary | ICD-10-CM | POA: Insufficient documentation

## 2023-10-14 DIAGNOSIS — I2510 Atherosclerotic heart disease of native coronary artery without angina pectoris: Secondary | ICD-10-CM | POA: Diagnosis not present

## 2023-10-14 DIAGNOSIS — Z7984 Long term (current) use of oral hypoglycemic drugs: Secondary | ICD-10-CM | POA: Insufficient documentation

## 2023-10-14 DIAGNOSIS — I5032 Chronic diastolic (congestive) heart failure: Secondary | ICD-10-CM | POA: Insufficient documentation

## 2023-10-14 DIAGNOSIS — J449 Chronic obstructive pulmonary disease, unspecified: Secondary | ICD-10-CM | POA: Diagnosis not present

## 2023-10-14 DIAGNOSIS — Z951 Presence of aortocoronary bypass graft: Secondary | ICD-10-CM | POA: Diagnosis not present

## 2023-10-14 DIAGNOSIS — G4730 Sleep apnea, unspecified: Secondary | ICD-10-CM | POA: Insufficient documentation

## 2023-10-14 DIAGNOSIS — G8191 Hemiplegia, unspecified affecting right dominant side: Secondary | ICD-10-CM | POA: Diagnosis not present

## 2023-10-14 DIAGNOSIS — K449 Diaphragmatic hernia without obstruction or gangrene: Secondary | ICD-10-CM | POA: Insufficient documentation

## 2023-10-14 DIAGNOSIS — I252 Old myocardial infarction: Secondary | ICD-10-CM | POA: Diagnosis not present

## 2023-10-14 DIAGNOSIS — Z0189 Encounter for other specified special examinations: Secondary | ICD-10-CM | POA: Diagnosis not present

## 2023-10-14 DIAGNOSIS — Z8249 Family history of ischemic heart disease and other diseases of the circulatory system: Secondary | ICD-10-CM | POA: Insufficient documentation

## 2023-10-14 DIAGNOSIS — I4510 Unspecified right bundle-branch block: Secondary | ICD-10-CM | POA: Insufficient documentation

## 2023-10-14 DIAGNOSIS — I358 Other nonrheumatic aortic valve disorders: Secondary | ICD-10-CM | POA: Diagnosis not present

## 2023-10-14 HISTORY — PX: LUMBAR LAMINECTOMY/DECOMPRESSION MICRODISCECTOMY: SHX5026

## 2023-10-14 SURGERY — LUMBAR LAMINECTOMY/DECOMPRESSION MICRODISCECTOMY 1 LEVEL
Anesthesia: General

## 2023-10-14 MED ORDER — BISACODYL 5 MG PO TBEC
5.0000 mg | DELAYED_RELEASE_TABLET | Freq: Every day | ORAL | Status: DC | PRN
  Filled 2023-10-14: qty 1

## 2023-10-14 MED ORDER — CEFAZOLIN SODIUM-DEXTROSE 2-4 GM/100ML-% IV SOLN
2.0000 g | INTRAVENOUS | Status: AC
  Administered 2023-10-14: 2 g via INTRAVENOUS

## 2023-10-14 MED ORDER — PANTOPRAZOLE SODIUM 40 MG PO TBEC
40.0000 mg | DELAYED_RELEASE_TABLET | Freq: Two times a day (BID) | ORAL | Status: DC
  Administered 2023-10-14: 40 mg via ORAL
  Filled 2023-10-14: qty 1

## 2023-10-14 MED ORDER — ACETAMINOPHEN 500 MG PO TABS
1000.0000 mg | ORAL_TABLET | Freq: Four times a day (QID) | ORAL | Status: DC
  Administered 2023-10-14 – 2023-10-15 (×3): 1000 mg via ORAL
  Filled 2023-10-14 (×4): qty 2

## 2023-10-14 MED ORDER — ROCURONIUM BROMIDE 10 MG/ML (PF) SYRINGE
PREFILLED_SYRINGE | INTRAVENOUS | Status: AC
  Filled 2023-10-14: qty 10

## 2023-10-14 MED ORDER — 0.9 % SODIUM CHLORIDE (POUR BTL) OPTIME
TOPICAL | Status: DC | PRN
  Administered 2023-10-14: 1000 mL

## 2023-10-14 MED ORDER — BUPIVACAINE-EPINEPHRINE 0.5% -1:200000 IJ SOLN
INTRAMUSCULAR | Status: DC | PRN
  Administered 2023-10-14: 4 mL

## 2023-10-14 MED ORDER — MENTHOL 3 MG MT LOZG: 1.0000 | LOZENGE | OROMUCOSAL | Status: DC | PRN

## 2023-10-14 MED ORDER — IRBESARTAN 75 MG PO TABS: 75.0000 mg | ORAL_TABLET | Freq: Every day | ORAL | Status: DC

## 2023-10-14 MED ORDER — SPIRONOLACTONE 12.5 MG HALF TABLET: 12.5000 mg | ORAL_TABLET | Freq: Every day | ORAL | Status: DC

## 2023-10-14 MED ORDER — TRAZODONE HCL 50 MG PO TABS
200.0000 mg | ORAL_TABLET | Freq: Every day | ORAL | Status: DC
  Administered 2023-10-14: 200 mg via ORAL
  Filled 2023-10-14: qty 4

## 2023-10-14 MED ORDER — ONDANSETRON HCL 4 MG/2ML IJ SOLN: 4.0000 mg | Freq: Four times a day (QID) | INTRAMUSCULAR | Status: DC | PRN

## 2023-10-14 MED ORDER — THROMBIN 20000 UNITS EX SOLR
CUTANEOUS | Status: DC | PRN
  Administered 2023-10-14: 20 mL via TOPICAL

## 2023-10-14 MED ORDER — PHENYLEPHRINE HCL-NACL 20-0.9 MG/250ML-% IV SOLN
INTRAVENOUS | Status: DC | PRN
  Administered 2023-10-14: 50 ug/min via INTRAVENOUS

## 2023-10-14 MED ORDER — FENOFIBRATE 54 MG PO TABS
54.0000 mg | ORAL_TABLET | Freq: Every day | ORAL | Status: DC
  Filled 2023-10-14: qty 1

## 2023-10-14 MED ORDER — PROPOFOL 10 MG/ML IV BOLUS
INTRAVENOUS | Status: AC
  Filled 2023-10-14: qty 20

## 2023-10-14 MED ORDER — THROMBIN 20000 UNITS EX SOLR
CUTANEOUS | Status: AC
  Filled 2023-10-14: qty 20000

## 2023-10-14 MED ORDER — PROPOFOL 10 MG/ML IV BOLUS
INTRAVENOUS | Status: DC | PRN
  Administered 2023-10-14: 200 mg via INTRAVENOUS

## 2023-10-14 MED ORDER — HYDROMORPHONE HCL 2 MG PO TABS
2.0000 mg | ORAL_TABLET | ORAL | Status: DC | PRN
  Administered 2023-10-14 – 2023-10-15 (×2): 2 mg via ORAL
  Filled 2023-10-14 (×4): qty 1

## 2023-10-14 MED ORDER — UMECLIDINIUM BROMIDE 62.5 MCG/ACT IN AEPB
1.0000 | INHALATION_SPRAY | Freq: Every day | RESPIRATORY_TRACT | Status: DC
  Administered 2023-10-15: 1 via RESPIRATORY_TRACT
  Filled 2023-10-14: qty 7

## 2023-10-14 MED ORDER — CHLORHEXIDINE GLUCONATE 0.12 % MT SOLN
OROMUCOSAL | Status: AC
  Administered 2023-10-14: 15 mL via OROMUCOSAL
  Filled 2023-10-14: qty 15

## 2023-10-14 MED ORDER — VITAMIN B-12 1000 MCG PO TABS
5000.0000 ug | ORAL_TABLET | Freq: Every day | ORAL | Status: DC
  Filled 2023-10-14: qty 5

## 2023-10-14 MED ORDER — VITAMIN D 25 MCG (1000 UNIT) PO TABS
5000.0000 [IU] | ORAL_TABLET | Freq: Every day | ORAL | Status: DC
  Filled 2023-10-14 (×2): qty 5

## 2023-10-14 MED ORDER — PHENOL 1.4 % MT LIQD: 1.0000 | OROMUCOSAL | Status: DC | PRN

## 2023-10-14 MED ORDER — ORAL CARE MOUTH RINSE: 15.0000 mL | Freq: Once | OROMUCOSAL | Status: AC

## 2023-10-14 MED ORDER — POLYETHYLENE GLYCOL 3350 17 G PO PACK: 17.0000 g | PACK | Freq: Every day | ORAL | Status: DC | PRN

## 2023-10-14 MED ORDER — ACETAMINOPHEN 10 MG/ML IV SOLN
1000.0000 mg | INTRAVENOUS | Status: AC
  Administered 2023-10-14: 1000 mg via INTRAVENOUS

## 2023-10-14 MED ORDER — CHLORHEXIDINE GLUCONATE 0.12 % MT SOLN: 15.0000 mL | Freq: Once | OROMUCOSAL | Status: AC

## 2023-10-14 MED ORDER — METHOCARBAMOL 1000 MG/10ML IJ SOLN: 500.0000 mg | Freq: Four times a day (QID) | INTRAMUSCULAR | Status: DC | PRN

## 2023-10-14 MED ORDER — LIDOCAINE HCL (PF) 2 % IJ SOLN
INTRAMUSCULAR | Status: DC | PRN
Start: 2023-10-14 — End: 2023-10-14
  Administered 2023-10-14: 100 mg via INTRADERMAL

## 2023-10-14 MED ORDER — HYDROMORPHONE HCL 2 MG PO TABS: 2.0000 mg | ORAL_TABLET | ORAL | 0 refills | Status: AC | PRN

## 2023-10-14 MED ORDER — RISAQUAD PO CAPS
1.0000 | ORAL_CAPSULE | Freq: Every day | ORAL | Status: DC
  Filled 2023-10-14: qty 1

## 2023-10-14 MED ORDER — POLYETHYLENE GLYCOL 3350 17 G PO PACK: 17.0000 g | PACK | Freq: Every day | ORAL | 0 refills | Status: DC

## 2023-10-14 MED ORDER — TAMSULOSIN HCL 0.4 MG PO CAPS
0.4000 mg | ORAL_CAPSULE | Freq: Two times a day (BID) | ORAL | Status: DC
  Administered 2023-10-14: 0.4 mg via ORAL
  Filled 2023-10-14: qty 1

## 2023-10-14 MED ORDER — NICOTINE 21 MG/24HR TD PT24: 21.0000 mg | MEDICATED_PATCH | Freq: Every day | TRANSDERMAL | Status: DC

## 2023-10-14 MED ORDER — ISOSORBIDE MONONITRATE ER 60 MG PO TB24: 90.0000 mg | ORAL_TABLET | Freq: Every day | ORAL | Status: DC

## 2023-10-14 MED ORDER — PHENYLEPHRINE HCL (PRESSORS) 10 MG/ML IV SOLN
INTRAVENOUS | Status: DC | PRN
  Administered 2023-10-14: 80 ug via INTRAVENOUS
  Administered 2023-10-14 (×2): 160 ug via INTRAVENOUS
  Administered 2023-10-14: 100 ug via INTRAVENOUS

## 2023-10-14 MED ORDER — LIDOCAINE 2% (20 MG/ML) 5 ML SYRINGE
INTRAMUSCULAR | Status: AC
  Filled 2023-10-14: qty 5

## 2023-10-14 MED ORDER — KCL IN DEXTROSE-NACL 20-5-0.45 MEQ/L-%-% IV SOLN
INTRAVENOUS | Status: AC
  Filled 2023-10-14: qty 1000

## 2023-10-14 MED ORDER — EPHEDRINE 5 MG/ML INJ
INTRAVENOUS | Status: AC
Start: 2023-10-14 — End: ?
  Filled 2023-10-14: qty 5

## 2023-10-14 MED ORDER — DEXAMETHASONE SODIUM PHOSPHATE 10 MG/ML IJ SOLN
INTRAMUSCULAR | Status: DC | PRN
  Administered 2023-10-14: 10 mg via INTRAVENOUS

## 2023-10-14 MED ORDER — METHOCARBAMOL 500 MG PO TABS
500.0000 mg | ORAL_TABLET | Freq: Four times a day (QID) | ORAL | Status: DC | PRN
  Administered 2023-10-14 (×2): 500 mg via ORAL
  Filled 2023-10-14 (×2): qty 1

## 2023-10-14 MED ORDER — ONDANSETRON HCL 4 MG/2ML IJ SOLN
INTRAMUSCULAR | Status: DC | PRN
  Administered 2023-10-14: 4 mg via INTRAVENOUS

## 2023-10-14 MED ORDER — FENTANYL CITRATE (PF) 250 MCG/5ML IJ SOLN
INTRAMUSCULAR | Status: AC
  Filled 2023-10-14: qty 5

## 2023-10-14 MED ORDER — LACTATED RINGERS IV SOLN: INTRAVENOUS | Status: DC

## 2023-10-14 MED ORDER — EZETIMIBE 10 MG PO TABS: 10.0000 mg | ORAL_TABLET | Freq: Every day | ORAL | Status: DC

## 2023-10-14 MED ORDER — ACETAMINOPHEN 10 MG/ML IV SOLN
INTRAVENOUS | Status: AC
  Filled 2023-10-14: qty 100

## 2023-10-14 MED ORDER — CLOPIDOGREL BISULFATE 75 MG PO TABS: 75.0000 mg | ORAL_TABLET | Freq: Every day | ORAL | Status: DC

## 2023-10-14 MED ORDER — LACTATED RINGERS IV SOLN
INTRAVENOUS | Status: DC
Start: 2023-10-14 — End: 2023-10-14

## 2023-10-14 MED ORDER — ACETAMINOPHEN 325 MG PO TABS: 650.0000 mg | ORAL_TABLET | ORAL | Status: DC | PRN

## 2023-10-14 MED ORDER — MIDAZOLAM HCL 2 MG/2ML IJ SOLN
INTRAMUSCULAR | Status: DC | PRN
  Administered 2023-10-14: 2 mg via INTRAVENOUS

## 2023-10-14 MED ORDER — ALBUTEROL SULFATE (2.5 MG/3ML) 0.083% IN NEBU: 2.5000 mg | INHALATION_SOLUTION | Freq: Four times a day (QID) | RESPIRATORY_TRACT | Status: DC | PRN

## 2023-10-14 MED ORDER — ASPIRIN 81 MG PO TBEC: 81.0000 mg | DELAYED_RELEASE_TABLET | Freq: Every day | ORAL | Status: DC

## 2023-10-14 MED ORDER — DOCUSATE SODIUM 100 MG PO CAPS
100.0000 mg | ORAL_CAPSULE | Freq: Two times a day (BID) | ORAL | Status: DC
  Filled 2023-10-14: qty 1

## 2023-10-14 MED ORDER — GUAIFENESIN ER 600 MG PO TB12: 600.0000 mg | ORAL_TABLET | Freq: Two times a day (BID) | ORAL | Status: DC | PRN

## 2023-10-14 MED ORDER — BUPIVACAINE-EPINEPHRINE (PF) 0.5% -1:200000 IJ SOLN
INTRAMUSCULAR | Status: AC
  Filled 2023-10-14: qty 30

## 2023-10-14 MED ORDER — CEFAZOLIN SODIUM-DEXTROSE 2-4 GM/100ML-% IV SOLN
INTRAVENOUS | Status: AC
  Filled 2023-10-14: qty 100

## 2023-10-14 MED ORDER — ATORVASTATIN CALCIUM 80 MG PO TABS: 80.0000 mg | ORAL_TABLET | Freq: Every day | ORAL | Status: DC

## 2023-10-14 MED ORDER — DEXAMETHASONE SODIUM PHOSPHATE 10 MG/ML IJ SOLN
INTRAMUSCULAR | Status: AC
  Filled 2023-10-14: qty 1

## 2023-10-14 MED ORDER — HYDROMORPHONE HCL 1 MG/ML IJ SOLN: 1.0000 mg | INTRAMUSCULAR | Status: DC | PRN

## 2023-10-14 MED ORDER — ROCURONIUM BROMIDE 10 MG/ML (PF) SYRINGE
PREFILLED_SYRINGE | INTRAVENOUS | Status: DC | PRN
  Administered 2023-10-14: 50 mg via INTRAVENOUS

## 2023-10-14 MED ORDER — OXYCODONE HCL 5 MG PO TABS
10.0000 mg | ORAL_TABLET | ORAL | Status: DC | PRN
  Administered 2023-10-14: 10 mg via ORAL
  Filled 2023-10-14: qty 2

## 2023-10-14 MED ORDER — VITAMIN B-12 5000 MCG SL SUBL: 5000.0000 ug | SUBLINGUAL_TABLET | Freq: Every day | SUBLINGUAL | Status: DC

## 2023-10-14 MED ORDER — DOCUSATE SODIUM 100 MG PO CAPS: 100.0000 mg | ORAL_CAPSULE | Freq: Two times a day (BID) | ORAL | 2 refills | Status: DC

## 2023-10-14 MED ORDER — ALUM & MAG HYDROXIDE-SIMETH 200-200-20 MG/5ML PO SUSP: 30.0000 mL | Freq: Four times a day (QID) | ORAL | Status: DC | PRN

## 2023-10-14 MED ORDER — FLUTICASONE FUROATE-VILANTEROL 200-25 MCG/ACT IN AEPB
1.0000 | INHALATION_SPRAY | Freq: Every day | RESPIRATORY_TRACT | Status: DC
Start: 2023-10-14 — End: 2023-10-15
  Administered 2023-10-15: 1 via RESPIRATORY_TRACT
  Filled 2023-10-14: qty 28

## 2023-10-14 MED ORDER — ALBUMIN HUMAN 5 % IV SOLN: INTRAVENOUS | Status: DC | PRN

## 2023-10-14 MED ORDER — MAGNESIUM CITRATE PO SOLN: 1.0000 | Freq: Once | ORAL | Status: DC | PRN

## 2023-10-14 MED ORDER — CEFAZOLIN SODIUM-DEXTROSE 2-4 GM/100ML-% IV SOLN
2.0000 g | Freq: Three times a day (TID) | INTRAVENOUS | Status: AC
  Administered 2023-10-14 (×2): 2 g via INTRAVENOUS
  Filled 2023-10-14 (×2): qty 100

## 2023-10-14 MED ORDER — INSULIN ASPART 100 UNIT/ML IJ SOLN: 0.0000 [IU] | Freq: Three times a day (TID) | INTRAMUSCULAR | Status: DC

## 2023-10-14 MED ORDER — SUGAMMADEX SODIUM 200 MG/2ML IV SOLN
INTRAVENOUS | Status: DC | PRN
  Administered 2023-10-14: 200 mg via INTRAVENOUS

## 2023-10-14 MED ORDER — ACETAMINOPHEN 650 MG RE SUPP: 650.0000 mg | RECTAL | Status: DC | PRN

## 2023-10-14 MED ORDER — BUDESON-GLYCOPYRROL-FORMOTEROL 160-9-4.8 MCG/ACT IN AERO: 2.0000 | INHALATION_SPRAY | Freq: Two times a day (BID) | RESPIRATORY_TRACT | Status: DC

## 2023-10-14 MED ORDER — SUCCINYLCHOLINE CHLORIDE 200 MG/10ML IV SOSY
PREFILLED_SYRINGE | INTRAVENOUS | Status: DC | PRN
  Administered 2023-10-14: 100 mg via INTRAVENOUS

## 2023-10-14 MED ORDER — ALBUTEROL SULFATE HFA 108 (90 BASE) MCG/ACT IN AERS: 2.0000 | INHALATION_SPRAY | Freq: Four times a day (QID) | RESPIRATORY_TRACT | Status: DC | PRN

## 2023-10-14 MED ORDER — MIDAZOLAM HCL 2 MG/2ML IJ SOLN
INTRAMUSCULAR | Status: AC
  Filled 2023-10-14: qty 2

## 2023-10-14 MED ORDER — ALBUTEROL SULFATE (2.5 MG/3ML) 0.083% IN NEBU: 2.5000 mg | INHALATION_SOLUTION | Freq: Once | RESPIRATORY_TRACT | Status: DC

## 2023-10-14 MED ORDER — SUGAMMADEX SODIUM 200 MG/2ML IV SOLN
INTRAVENOUS | Status: AC
  Filled 2023-10-14: qty 2

## 2023-10-14 MED ORDER — ONDANSETRON HCL 4 MG/2ML IJ SOLN
INTRAMUSCULAR | Status: AC
  Filled 2023-10-14: qty 2

## 2023-10-14 MED ORDER — ONDANSETRON HCL 4 MG PO TABS: 4.0000 mg | ORAL_TABLET | Freq: Four times a day (QID) | ORAL | Status: DC | PRN

## 2023-10-14 MED ORDER — FENTANYL CITRATE (PF) 250 MCG/5ML IJ SOLN
INTRAMUSCULAR | Status: DC | PRN
  Administered 2023-10-14: 100 ug via INTRAVENOUS

## 2023-10-14 MED ORDER — PHENYLEPHRINE 80 MCG/ML (10ML) SYRINGE FOR IV PUSH (FOR BLOOD PRESSURE SUPPORT)
PREFILLED_SYRINGE | INTRAVENOUS | Status: AC
  Filled 2023-10-14: qty 10

## 2023-10-14 MED ORDER — DAPAGLIFLOZIN PROPANEDIOL 5 MG PO TABS
5.0000 mg | ORAL_TABLET | Freq: Every day | ORAL | Status: DC
  Administered 2023-10-15: 5 mg via ORAL
  Filled 2023-10-14: qty 1

## 2023-10-14 SURGICAL SUPPLY — 54 items
BAG COUNTER SPONGE SURGICOUNT (BAG) ×1 IMPLANT
BAG DECANTER FOR FLEXI CONT (MISCELLANEOUS) IMPLANT
BAND RUBBER #18 3X1/16 STRL (MISCELLANEOUS) ×2 IMPLANT
BUR EGG ELITE 5.0 (BURR) IMPLANT
BUR RND DIAMOND ELITE 4.0 (BURR) IMPLANT
CLEANER TIP ELECTROSURG 2X2 (MISCELLANEOUS) ×1 IMPLANT
CNTNR URN SCR LID CUP LEK RST (MISCELLANEOUS) ×1 IMPLANT
DRAPE LAPAROTOMY 100X72X124 (DRAPES) ×1 IMPLANT
DRAPE MICROSCOPE SLANT 54X150 (MISCELLANEOUS) ×1 IMPLANT
DRAPE SHEET LG 3/4 BI-LAMINATE (DRAPES) ×1 IMPLANT
DRAPE SURG 17X11 SM STRL (DRAPES) ×1 IMPLANT
DRAPE UTILITY XL STRL (DRAPES) ×1 IMPLANT
DRESSING ALLEVYN LITE 5X5 NADH (GAUZE/BANDAGES/DRESSINGS) IMPLANT
DRSG ALLEVYN LITE 5X5 NADH (GAUZE/BANDAGES/DRESSINGS) ×1 IMPLANT
DRSG AQUACEL AG ADV 3.5X 4 (GAUZE/BANDAGES/DRESSINGS) IMPLANT
DRSG AQUACEL AG ADV 3.5X 6 (GAUZE/BANDAGES/DRESSINGS) IMPLANT
DRSG TELFA 3X8 NADH STRL (GAUZE/BANDAGES/DRESSINGS) IMPLANT
DURAPREP 26ML APPLICATOR (WOUND CARE) ×1 IMPLANT
DURASEAL SPINE SEALANT 3ML (MISCELLANEOUS) IMPLANT
ELECT BLADE 4.0 EZ CLEAN MEGAD (MISCELLANEOUS) IMPLANT
ELECT REM PT RETURN 9FT ADLT (ELECTROSURGICAL) ×1 IMPLANT
ELECTRODE BLDE 4.0 EZ CLN MEGD (MISCELLANEOUS) IMPLANT
ELECTRODE REM PT RTRN 9FT ADLT (ELECTROSURGICAL) ×1 IMPLANT
GLOVE BIOGEL PI IND STRL 7.5 (GLOVE) ×1 IMPLANT
GLOVE SURG SS PI 7.0 STRL IVOR (GLOVE) ×1 IMPLANT
GLOVE SURG SS PI 8.0 STRL IVOR (GLOVE) ×2 IMPLANT
GOWN STRL REUS W/ TWL LRG LVL3 (GOWN DISPOSABLE) ×1 IMPLANT
GOWN STRL REUS W/ TWL XL LVL3 (GOWN DISPOSABLE) ×1 IMPLANT
IV CATH 14GX2 1/4 (CATHETERS) ×1 IMPLANT
KIT BASIN OR (CUSTOM PROCEDURE TRAY) ×1 IMPLANT
NDL 22X1.5 STRL (OR ONLY) (MISCELLANEOUS) ×1 IMPLANT
NDL SPNL 18GX3.5 QUINCKE PK (NEEDLE) ×2 IMPLANT
NEEDLE 22X1.5 STRL (OR ONLY) (MISCELLANEOUS) ×1 IMPLANT
NEEDLE SPNL 18GX3.5 QUINCKE PK (NEEDLE) ×2 IMPLANT
PACK LAMINECTOMY NEURO (CUSTOM PROCEDURE TRAY) ×1 IMPLANT
PATTIES SURGICAL .25X.25 (GAUZE/BANDAGES/DRESSINGS) IMPLANT
PATTIES SURGICAL .75X.75 (GAUZE/BANDAGES/DRESSINGS) ×1 IMPLANT
SOLUTION PRONTOSAN WOUND 350ML (IRRIGATION / IRRIGATOR) IMPLANT
SPONGE SURGIFOAM ABS GEL 100 (HEMOSTASIS) ×1 IMPLANT
SPONGE T-LAP 4X18 ~~LOC~~+RFID (SPONGE) IMPLANT
STAPLER VISISTAT (STAPLE) IMPLANT
STRIP CLOSURE SKIN 1/2X4 (GAUZE/BANDAGES/DRESSINGS) ×1 IMPLANT
SUT NURALON 4 0 TR CR/8 (SUTURE) IMPLANT
SUT PROLENE 3 0 PS 2 (SUTURE) IMPLANT
SUT VIC AB 1 CT1 27XBRD ANTBC (SUTURE) IMPLANT
SUT VIC AB 1-0 CT2 27 (SUTURE) IMPLANT
SUT VIC AB 2-0 CT1 TAPERPNT 27 (SUTURE) IMPLANT
SUT VIC AB 2-0 CT2 27 (SUTURE) IMPLANT
SYR 3ML LL SCALE MARK (SYRINGE) ×1 IMPLANT
TOWEL GREEN STERILE (TOWEL DISPOSABLE) ×1 IMPLANT
TOWEL GREEN STERILE FF (TOWEL DISPOSABLE) ×1 IMPLANT
TRAY FOLEY MTR SLVR 16FR STAT (SET/KITS/TRAYS/PACK) ×1 IMPLANT
WIPE CHG 2% 2PK PREOPERATIVE (MISCELLANEOUS) ×1 IMPLANT
YANKAUER SUCT BULB TIP NO VENT (SUCTIONS) ×1 IMPLANT

## 2023-10-14 NOTE — Anesthesia Procedure Notes (Signed)
 Procedure Name: Intubation Date/Time: 10/14/2023 7:51 AM  Performed by: Gloris Ham, CRNAPre-anesthesia Checklist: Patient identified, Emergency Drugs available, Suction available and Patient being monitored Patient Re-evaluated:Patient Re-evaluated prior to induction Oxygen Delivery Method: Circle System Utilized Preoxygenation: Pre-oxygenation with 100% oxygen Induction Type: IV induction Ventilation: Mask ventilation without difficulty Laryngoscope Size: Glidescope and 3 Grade View: Grade I Tube type: Oral Tube size: 8.0 mm Number of attempts: 1 Airway Equipment and Method: Stylet and Oral airway Placement Confirmation: ETT inserted through vocal cords under direct vision, positive ETCO2 and breath sounds checked- equal and bilateral Secured at: 22 cm Tube secured with: Tape Dental Injury: Teeth and Oropharynx as per pre-operative assessment

## 2023-10-14 NOTE — Brief Op Note (Signed)
 10/14/2023  7:22 AM  PATIENT:  [name].  63 y.o. male  PRE-OPERATIVE DIAGNOSIS:  Herniated disc L5-S1  POST-OPERATIVE DIAGNOSIS:  * No post-op diagnosis entered *  PROCEDURE:  Procedure(s) with comments: Microlumbar decompression L5-S1 (N/A) - 3 C-Bed  SURGEON:  Surgeons and Role:    Jene Every, MD - Primary  PHYSICIAN ASSISTANT:   ASSISTANTS: Bissell   ANESTHESIA:   general  EBL:  25   BLOOD ADMINISTERED:none  DRAINS: none   LOCAL MEDICATIONS USED:  MARCAINE     SPECIMEN:  No Specimen  DISPOSITION OF SPECIMEN:  N/A  COUNTS:  YES  TOURNIQUET:  * No tourniquets in log *  DICTATION: .Other Dictation: Dictation Number [id number]   PLAN OF CARE: Admit for overnight observation  PATIENT DISPOSITION:  PACU - hemodynamically stable.   Delay start of Pharmacological VTE agent (>24hrs) due to surgical blood loss or risk of bleeding: yes

## 2023-10-14 NOTE — Anesthesia Postprocedure Evaluation (Signed)
 Anesthesia Post Note  Patient: [name].  Procedure(s) Performed: Microlumbar decompression L5-S1     Patient location during evaluation: PACU Anesthesia Type: General Level of consciousness: awake Pain management: pain level controlled Vital Signs Assessment: post-procedure vital signs reviewed and stable Respiratory status: spontaneous breathing, nonlabored ventilation and respiratory function stable Cardiovascular status: blood pressure returned to baseline and stable Postop Assessment: no apparent nausea or vomiting Anesthetic complications: no   There were no known notable events for this encounter.  Last Vitals:  Vitals:   10/14/23 1115 10/14/23 1145  BP: (!) 126/92 139/81  Pulse: 79 77  Resp: 18 16  Temp:    SpO2: 98% 96%    Last Pain:  Vitals:   10/14/23 1145  TempSrc:   PainSc: 0-No pain                 Linton Rump

## 2023-10-14 NOTE — Discharge Instructions (Signed)
Walk As Tolerated utilizing back precautions.  No bending, twisting, or lifting.  No driving for 2 weeks.   °Aquacel dressing may remain in place until follow up. May shower with aquacel dressing in place. If the dressing peels off or becomes saturated, you may remove aquacel dressing and place gauze and tape dressing which should be kept clean and dry and changed daily. Do not remove steri-strips if they are present. °See Dr. Syriah Delisi in office in 10 to 14 days. Begin taking aspirin 81mg per day starting 4 days after your surgery if not allergic to aspirin or on another blood thinner. °Walk daily even outside. Use a cane or walker only if necessary. °Avoid sitting on soft sofas. ° °

## 2023-10-14 NOTE — Op Note (Signed)
 [name], STRUPP MEDICAL RECORD NO: [id number] ACCOUNT NO: [id number] DATE OF BIRTH: 1960/08/28 FACILITY: [facility] LOCATION: [facility] PHYSICIAN: Javier Docker, MD  Operative Report   DATE OF PROCEDURE: 10/14/2023  PREOPERATIVE DIAGNOSES:  Spinal stenosis and HNP L5-S1 right.  POSTOPERATIVE DIAGNOSES:  Spinal stenosis, HNP L5-S1 right and extensive epidural venous plexus, L5-S1 right.  PROCEDURE PERFORMED: 1.  Microdiscectomy L5-S1 right. 2.  Foraminotomies S1 right with right hemilaminotomy. 3.  Lysis of epidural venous plexus, extensive.  BRIEF HISTORY:  This is a 63 year old male with persistent right lower extremity radicular pain in the S1 nerve root distribution, neural tension signs, diminished repetitive plantar flexion, indicated for microdiscectomy for an extruded disc herniation  at L5-S1 to the right displacing the S1 nerve root.  Failing conservative treatment.  Risks and benefits were discussed including bleeding, infection, damage to neurovascular structures, no change in symptoms, worsening symptoms, DVT, PE, anesthetic  complications, etc.  DESCRIPTION OF PROCEDURE:  The patient was in supine position after the induction of adequate general anesthesia and 2 g of Kefzol.  The patient was placed prone on the Wilson frame.  All bony prominences were well padded.  Lumbar region was prepped and  draped in the usual sterile fashion.  Two 18-gauge spinal needles were utilized to localize the L5-S1 interspace confirmed with x-ray.  Incision was made from the spinous process to L5-S1.  Subcutaneous tissue was dissected.  Electrocautery was utilized  to achieve hemostasis.  Paraspinous muscles elevated from lamina at L5 and S1.  McCulloch retractor was placed.  The operating microscope was draped and brought in the surgical field.  Confirmatory radiographs obtained at L5-S1.  Hemilaminotomy of the  caudad edge of L5 was performed with a 3-mm Kerrison.  We identified the  interlaminar space beneath the L5 lamina.  I used a straight curette to detach the ligamentum flavum from the cephalad edge of S1 and the medial border of the superior articulating  process of S1.  I used a Penfield IV then to enter the epidural space.  Protecting the S1 nerve root medially, I performed a generous foraminotomy of S1 and decompressed the lateral recess to the medial border of the pedicle.  Placed a neural patty  beneath the ligamentum flavum and removed ligamentum flavum from the interspace.  I identified the S1 nerve root, which was significantly tethered into the lateral recess and there was lateral recess stenosis noted as well.  This was tethered by a  vascular leash.  These were cauterized and divided.  One extended into the L5 foramen, two down the S1 foramen and one cephalad.  Following this, there was good mobilization of the S1 nerve root medial to the pedicle.  Small focal H and P was identified  and extracted.  I performed an annulotomy at the disc space and disc material was removed from the disc space with a micropituitary further lavage with catheter lavage.  The distal small fragments were removed.  Confirmatory radiographs obtained with  Penfield in the disc space.  Woodson probe passed freely out of the foramen of L5 and S1 above the pedicle of L5 and below the pedicle of S1.  There was 1 cm of excursion of the S1 nerve root medial to the pedicle without tension.  I checked beneath the  thecal sac, the shoulder of the root, the axilla of the root, and both foramen.  There was no residual disc herniation noted.  Good mobility noted.  Bone wax was placed on  the cancellous surfaces.  Copiously irrigated the wound.  Then, thrombin-soaked  Gelfoam was placed and removed.  Inspection revealed no evidence of CSF leakage or active bleeding.  I removed the McCulloch retractor, irrigated the paraspinous musculature.  Bipolar electrocautery was utilized to achieve strict hemostasis.   Irrigated  the paraspinous musculature.  Evacuated that Gelfoam placed again and then removed as was the thrombin.  No active bleeding.  Closed dorsal lumbar fascia with 1-0 Vicryl interrupted figure-of-eight suture.  Subcu with 2-0 and skin with subcuticular  Prolene.  Sterile dressing applied, placed supine on the hospital bed, extubated without difficulty, and transported to the recovery room in satisfactory condition.  The patient tolerated the procedure well.  No complications.  Assistant, Orland Penman, PA was used throughout the case for patient positioning, general intermittent neural traction, suction, and closure.  BLOOD LOSS:  25 mL.   PUS D: 10/14/2023 9:29:51 am T: 10/14/2023 10:13:00 am  JOB: [id number]/ [id number]

## 2023-10-14 NOTE — Transfer of Care (Signed)
 Immediate Anesthesia Transfer of Care Note  Patient: [name].  Procedure(s) Performed: Microlumbar decompression L5-S1  Patient Location: PACU  Anesthesia Type:General  Level of Consciousness: awake, alert , and patient cooperative  Airway & Oxygen Therapy: Patient Spontanous Breathing and Patient connected to face mask oxygen  Post-op Assessment: Report given to RN, Post -op Vital signs reviewed and stable, and Patient moving all extremities X 4  Post vital signs: Reviewed and stable  Last Vitals:  Vitals Value Taken Time  BP 169/90 10/14/23 0938  Temp    Pulse 87 10/14/23 0943  Resp 23 10/14/23 0943  SpO2 91 % 10/14/23 0943  Vitals shown include unfiled device data.  Last Pain:  Vitals:   10/14/23 0640  TempSrc:   PainSc: 8       Patients Stated Pain Goal: 4 (10/14/23 0640)  Complications: There were no known notable events for this encounter.

## 2023-10-15 ENCOUNTER — Encounter (HOSPITAL_COMMUNITY): Payer: Self-pay | Admitting: Specialist

## 2023-10-15 DIAGNOSIS — I110 Hypertensive heart disease with heart failure: Secondary | ICD-10-CM | POA: Diagnosis not present

## 2023-10-15 DIAGNOSIS — I5032 Chronic diastolic (congestive) heart failure: Secondary | ICD-10-CM | POA: Diagnosis not present

## 2023-10-15 DIAGNOSIS — I2510 Atherosclerotic heart disease of native coronary artery without angina pectoris: Secondary | ICD-10-CM | POA: Diagnosis not present

## 2023-10-15 DIAGNOSIS — M4807 Spinal stenosis, lumbosacral region: Secondary | ICD-10-CM | POA: Diagnosis not present

## 2023-10-15 DIAGNOSIS — M5127 Other intervertebral disc displacement, lumbosacral region: Secondary | ICD-10-CM | POA: Diagnosis not present

## 2023-10-15 DIAGNOSIS — J449 Chronic obstructive pulmonary disease, unspecified: Secondary | ICD-10-CM | POA: Diagnosis not present

## 2023-10-15 DIAGNOSIS — Z9981 Dependence on supplemental oxygen: Secondary | ICD-10-CM | POA: Diagnosis not present

## 2023-10-15 DIAGNOSIS — G4730 Sleep apnea, unspecified: Secondary | ICD-10-CM | POA: Diagnosis not present

## 2023-10-15 DIAGNOSIS — M47817 Spondylosis without myelopathy or radiculopathy, lumbosacral region: Secondary | ICD-10-CM | POA: Diagnosis not present

## 2023-10-15 NOTE — Evaluation (Signed)
 Physical Therapy Evaluation  Patient Details Name: [name]. MRN: [id number] DOB: 02/12/1961 Today's Date: 10/15/2023  History of Present Illness  [name] is a 63 y.o. male presenting s/p microlumbar decompression L5-S1 on 10/14/2023. PMHx includes HRpEF, AAA, abnormality of gait, anginal pain, anxiety, CAD, ETOH abuse, cocaine use, chronic diastolic CHF, GERD, Emphysema, HTN, HLD, PAD, Stroke, COPD, hemianopia homonymous (right), myocardial infarction   Clinical Impression  Pt admitted with above diagnosis. At the time of PT eval, pt was able to demonstrate transfers and ambulation with gross CGA and SPC for safety. Pt was educated on precautions, brace application/wearing schedule, appropriate activity progression, and car transfer. Pt currently with functional limitations due to the deficits listed below (see PT Problem List). Pt will benefit from skilled PT to increase their independence and safety with mobility to allow discharge to the venue listed below.          If plan is discharge home, recommend the following: A little help with walking and/or transfers;A little help with bathing/dressing/bathroom;Assistance with cooking/housework;Assist for transportation;Help with stairs or ramp for entrance   Can travel by private vehicle        Equipment Recommendations Cane  Recommendations for Other Services       Functional Status Assessment Patient has had a recent decline in their functional status and demonstrates the ability to make significant improvements in function in a reasonable and predictable amount of time.     Precautions / Restrictions Precautions Precautions: Back;Fall Precaution Booklet Issued: Yes (comment) Recall of Precautions/Restrictions: Intact Precaution/Restrictions Comments: Pt provided with handout of back precautions. Back precautions reviewed with pt Restrictions Weight Bearing Restrictions Per Provider Order: No      Mobility   Bed Mobility               General bed mobility comments: Verbally reviewed log roll technique.    Transfers Overall transfer level: Needs assistance Equipment used: Straight cane Transfers: Sit to/from Stand Sit to Stand: Supervision           General transfer comment: VC's for hand placement on seated surface for safety. Cued pt to hold cane in L hand once up due to residual R sided deficits from stroke.    Ambulation/Gait Ambulation/Gait assistance: Contact guard assist Gait Distance (Feet): 200 Feet Assistive device: Rolling walker (2 wheels) Gait Pattern/deviations: Step-through pattern, Decreased stride length, Trunk flexed Gait velocity: Decreased Gait velocity interpretation: 1.31 - 2.62 ft/sec, indicative of limited community ambulator   General Gait Details: VC's for improved posture, sequencing with the SPC, and forward gaze.  Stairs Stairs: Yes Stairs assistance: Mod assist Stair Management: No rails, Step to pattern, Alternating pattern, Forwards, With cane Number of Stairs: 6 General stair comments: VC's for sequencing and general safety. Recommended pt perform step-to pattern for safety but pt preferring alternating steps  Wheelchair Mobility     Tilt Bed    Modified Rankin (Stroke Patients Only)       Balance Overall balance assessment: Needs assistance Sitting-balance support: Feet supported, No upper extremity supported Sitting balance-Leahy Scale: Good     Standing balance support: No upper extremity supported, During functional activity Standing balance-Leahy Scale: Fair                               Pertinent Vitals/Pain Pain Assessment Pain Assessment: Faces Faces Pain Scale: Hurts even more Pain Location: incision site Pain Descriptors / Indicators: Discomfort  Pain Intervention(s): Limited activity within patient's tolerance, Monitored during session, Repositioned    Home Living Family/patient expects to be  discharged to:: Private residence Living Arrangements: Alone Available Help at Discharge: Friend(s);Available PRN/intermittently Type of Home: House Home Access: Stairs to enter Entrance Stairs-Rails: Doctor, general practice of Steps: 4   Home Layout: One level Home Equipment: Agricultural consultant (2 wheels);Crutches;Shower seat;Grab bars - tub/shower Additional Comments: Pt requesting SPC for balance    Prior Function Prior Level of Function : Independent/Modified Independent;Driving             Mobility Comments: Pt reports not using AD at baseline but furniture walks within home for balance ADLs Comments: completed all ADLs/IADLs independently     Extremity/Trunk Assessment   Upper Extremity Assessment Upper Extremity Assessment: Defer to OT evaluation    Lower Extremity Assessment Lower Extremity Assessment: Generalized weakness;RLE deficits/detail RLE Deficits / Details: Residual deficits from prior CVA    Cervical / Trunk Assessment Cervical / Trunk Assessment: Back Surgery  Communication   Communication Communication: No apparent difficulties    Cognition Arousal: Alert Behavior During Therapy: WFL for tasks assessed/performed                             Following commands: Intact       Cueing Cueing Techniques: Verbal cues     General Comments      Exercises     Assessment/Plan    PT Assessment Patient needs continued PT services  PT Problem List Decreased strength;Decreased activity tolerance;Decreased balance;Decreased mobility;Decreased knowledge of use of DME;Decreased knowledge of precautions;Decreased safety awareness;Pain       PT Treatment Interventions DME instruction;Gait training;Stair training;Functional mobility training;Therapeutic activities;Therapeutic exercise;Balance training;Patient/family education    PT Goals (Current goals can be found in the Care Plan section)  Acute Rehab PT Goals Patient Stated Goal:  Home today PT Goal Formulation: With patient Time For Goal Achievement: 10/22/23 Potential to Achieve Goals: Good    Frequency Min 5X/week     Co-evaluation               AM-PAC PT "6 Clicks" Mobility  Outcome Measure Help needed turning from your back to your side while in a flat bed without using bedrails?: A Little Help needed moving from lying on your back to sitting on the side of a flat bed without using bedrails?: A Little Help needed moving to and from a bed to a chair (including a wheelchair)?: A Little Help needed standing up from a chair using your arms (e.g., wheelchair or bedside chair)?: A Little Help needed to walk in hospital room?: A Little Help needed climbing 3-5 steps with a railing? : A Little 6 Click Score: 18    End of Session Equipment Utilized During Treatment: Gait belt Activity Tolerance: Patient tolerated treatment well Patient left: in bed;with call bell/phone within reach;with family/visitor present Nurse Communication: Mobility status PT Visit Diagnosis: Unsteadiness on feet (R26.81);Pain Pain - part of body:  (back)    Time: 4098-1191 PT Time Calculation (min) (ACUTE ONLY): 11 min   Charges:   PT Evaluation $PT Eval Low Complexity: 1 Low   PT General Charges $$ ACUTE PT VISIT: 1 Visit         Conni Slipper, PT, DPT Acute Rehabilitation Services Secure Chat Preferred Office: [phone]   Marylynn Pearson 10/15/2023, 1:57 PM

## 2023-10-15 NOTE — Discharge Summary (Signed)
 Physician Discharge Summary   Patient ID: [name]. MRN: [id number] DOB/AGE: 11-26-1960 63 y.o.  Admit date: 10/14/2023 Discharge date: 10/15/2023  Primary Diagnosis:   Herniated disc [place] one  Admission Diagnoses:  Past Medical History:  Diagnosis Date   (HFpEF) heart failure with preserved ejection fraction (HCC)    Echo 6/22: EF 55-60, no RWMA, GR 3 DD, elevated LVEDP, normal RVSF, RVSP 53.2, mild to moderate AV sclerosis without stenosis, dilated aortic root (41 mm), ascending aorta 37 mm   AAA (abdominal aortic aneurysm) (HCC)    Abnormality of gait    Anginal pain (HCC)    Anxiety    CAD (coronary artery disease)    a. 10/2008 Inferolateral STEMI: 3VD w/ occluded LCX (PTCA)-->CABG x 3 (LIMA->LAD, VG->OM, VG->PDA);  b. 06/2009 NSTEMI/PCI: VG->OM 100 (BMS);  c. 05/2013 native 3VD, 3/3 patent grafts. d. 06/2016: Inferior STEMI w/ occluded SVG-OM (DES placed). LIMA-LAD and SVG-PDA patent.   Chronic bronchitis (HCC)    Chronic diastolic CHF (congestive heart failure) (HCC)    a. 02/2015 Echo: EF 50-55%, distal septal, apical, inferobasal HK, mild LVH, mild MR, mildly dil LA.   Cocaine use    Colon polyps    a. 09/2015 colonscopy: multiple sessile polyps, path negative for high grade dysplasia.   COPD (chronic obstructive pulmonary disease) (HCC)    Depression    Emphysema (subcutaneous) (surgical) resulting from a procedure 10/2008   Bleb resected at time of 10/2008 CABG. Marketed emphysema noted on surgical report.   Esophagitis    a. 2017 EGD: esophagitis, duodenitis.   ETOH abuse    GERD (gastroesophageal reflux disease)    Hemianopia, homonymous, right    "can't see out the right side of either eye since stroke in 2016/pt description 09/23/2016   Hepatic steatosis    Hiatal hernia    History of nuclear stress test 08/2017   Nuclear stress test 1/19: EF 35, inf-lat and ant-lat, apical sept/apical scar; no ischemia; Intermediate Risk   Hyperlipidemia     Hypertension    Iron deficiency anemia 2010   Morbid obesity (HCC)    Myocardial infarction [facility]) "several"   PAD (peripheral artery disease) (HCC)    Pancreatitis 06/2015   Sleep apnea    "don't have a mask" (09/23/2016)   Stroke (HCC) 02/2015   a. 02/2015 Carotid U/S: 1-39% bilat ICA stenosis; "left me blind" (09/23/2016)   Tobacco abuse    still smokes   Tubular adenoma of colon    Discharge Diagnoses:   Principal Problem:   HNP (herniated nucleus pulposus), lumbar  Procedure:  Procedure(s) (LRB): Microlumbar decompression L5-S1 (N/A)   Consults: None  HPI:  See H&P    Laboratory Data: Hospital Outpatient Visit on 10/07/2023  Component Date Value Ref Range Status   Sodium 10/07/2023 138  135 - 145 mmol/L Final   Potassium 10/07/2023 3.9  3.5 - 5.1 mmol/L Final   Chloride 10/07/2023 100  98 - 111 mmol/L Final   CO2 10/07/2023 26  22 - 32 mmol/L Final   Glucose, Bld 10/07/2023 83  70 - 99 mg/dL Final   Glucose reference range applies only to samples taken after fasting for at least 8 hours.   BUN 10/07/2023 16  8 - 23 mg/dL Final   Creatinine, Ser 10/07/2023 1.01  0.61 - 1.24 mg/dL Final   Calcium 40/98/1191 9.3  8.9 - 10.3 mg/dL Final   Total Protein 47/82/9562 7.7  6.5 - 8.1 g/dL Final   Albumin  10/07/2023 4.2  3.5 - 5.0 g/dL Final   AST 57/84/6962 17  15 - 41 U/L Final   ALT 10/07/2023 12  0 - 44 U/L Final   Alkaline Phosphatase 10/07/2023 44  38 - 126 U/L Final   Total Bilirubin 10/07/2023 1.5 (H)  0.0 - 1.2 mg/dL Final   GFR, Estimated 10/07/2023 >60  >60 mL/min Final   Comment: (NOTE) Calculated using the CKD-EPI Creatinine Equation (2021)    Anion gap 10/07/2023 12  5 - 15 Final   Performed at [facility], [address] [place], [place] [address]   WBC 10/07/2023 5.6  4.0 - 10.5 K/uL Final   RBC 10/07/2023 5.52  4.22 - 5.81 MIL/uL Final   Hemoglobin 10/07/2023 14.9  13.0 - 17.0 g/dL Final   HCT 13/24/4010 47.2  39.0 - 52.0 % Final   MCV 10/07/2023  85.5  80.0 - 100.0 fL Final   MCH 10/07/2023 27.0  26.0 - 34.0 pg Final   MCHC 10/07/2023 31.6  30.0 - 36.0 g/dL Final   RDW 27/25/3664 20.0 (H)  11.5 - 15.5 % Final   Platelets 10/07/2023 214  150 - 400 K/uL Final   nRBC 10/07/2023 0.0  0.0 - 0.2 % Final   Performed at [facility], [address] [place], [place] [address]   MRSA, PCR 10/07/2023 NEGATIVE  NEGATIVE Final   Staphylococcus aureus 10/07/2023 NEGATIVE  NEGATIVE Final   Comment: (NOTE) The Xpert SA Assay (FDA approved for NASAL specimens in patients 66 years of age and older), is one component of a comprehensive surveillance program. It is not intended to diagnose infection nor to guide or monitor treatment. Performed at [facility], [address], [place], [place] [address]    No results for input(s): "HGB" in the last 72 hours. No results for input(s): "WBC", "RBC", "HCT", "PLT" in the last 72 hours. No results for input(s): "NA", "K", "CL", "CO2", "BUN", "CREATININE", "GLUCOSE", "CALCIUM" in the last 72 hours. No results for input(s): "LABPT", "INR" in the last 72 hours.  X-Rays:DG Lumbar Spine 2-3 Views Result Date: 10/14/2023 CLINICAL DATA:  [id number] HNP (herniated nucleus pulposus), lumbar [id number] Preop. EXAM: LUMBAR SPINE - 2-3 VIEW COMPARISON:  None Available. FINDINGS: Five non-rib-bearing lumbar vertebra. Vertebral bodies related bulge. Trace retrolisthesis of L5 on S1. Otherwise normal alignment. Anterior spurring at multiple levels with mild L5-S1 disc space narrowing. No evidence of pars defects, fracture or compression deformity. Extensive aortic atherosclerosis. Aortic calcifications suggest 4.3 cm aortic aneurysm. IMPRESSION: 1. Mild multilevel spondylosis. 2. Findings suggestive of 4.3 cm aortic aneurysm. This previously measured 3.3 cm on 2022 CT. Consider aortic ultrasound for assessment. Consensus guidelines for an aneurysm of this size: Recommend follow-up every 12 months and vascular consultation.  This recommendation follows ACR consensus guidelines: White Paper of the ACR Incidental Findings Committee II on Vascular Findings. J Am Coll Radiol 2013; 10:789-794. Electronically Signed   By: Narda Rutherford [name]   On: 10/14/2023 10:48   DG Lumbar Spine 2-3 Views Result Date: 10/14/2023 CLINICAL DATA:  Elective surgery.  Intraop localization. EXAM: LUMBAR SPINE - 2-3 VIEW COMPARISON:  10/07/2023 FINDINGS: Three lateral views of the lumbar spine submitted from the operating room. Film 1 demonstrates localization posteriorly at the L5-S1 and S1 level. Film 2 demonstrates surgical instruments localizing posteriorly at the L5-S1 level. Film 3 demonstrates surgical instruments projecting over the posterior aspect of the L5-S1 disc space. IMPRESSION: Intraoperative localization at the L5-S1 level.  Electronically Signed   By: Narda Rutherford [name]   On: 10/14/2023 10:46   VAS [place] ABI WITH/WO TBI Result Date: 09/16/2023  LOWER EXTREMITY DOPPLER STUDY Patient Name:  Aidyn Kellis.  Date of Exam:   09/16/2023 Medical Rec #: [id number]             Accession #:    [id number] Date of Birth: 1961-01-29             Patient Gender: M Patient Age:   66 years Exam Location:  [facility] Procedure:      VAS [place] ABI WITH/WO TBI Referring Phys: Andrez Grime --------------------------------------------------------------------------------  Indications: Peripheral artery disease. Patient reports chronic h/o back pain              that has gotten worse over the course of two years. He states his              balance is off and both legs hurt due to issues involving his back              which occurs immediately upon walking. High Risk Factors: Hypertension, hyperlipidemia, Diabetes, current smoker, prior                    MI, coronary artery disease. Other Factors: COPD.  Vascular Interventions: Successful directional atherectomy and drug-coated                         balloon angioplasty to the right SFA on 02/18/2022.                          Successful directional atherectomy of the left SFA                         followed by drug-coated balloon angioplasty and spot                         stenting in the mid segment on 09/23/2016. Comparison Study: In 03/2022, a lower arterial Doppler showed an ABI of .93 on                   the right and .86 on the left. Performing Technologist: Tyna Jaksch RVT  Examination Guidelines: A complete evaluation includes at minimum, Doppler waveform signals and systolic blood pressure reading at the level of bilateral brachial, anterior tibial, and posterior tibial arteries, when vessel segments are accessible. Bilateral testing is considered an integral part of a complete examination. Photoelectric Plethysmograph (PPG) waveforms and toe systolic pressure readings are included as required and additional duplex testing as needed. Limited examinations for reoccurring indications may be performed as noted.  ABI Findings: +---------+------------------+-----+--------+--------+ Right    Rt Pressure (mmHg)IndexWaveformComment  +---------+------------------+-----+--------+--------+ Brachial 131                                     +---------+------------------+-----+--------+--------+ PTA      106               0.80                  +---------+------------------+-----+--------+--------+ DP       95                0.71                  +---------+------------------+-----+--------+--------+  Great Toe96                0.72                  +---------+------------------+-----+--------+--------+ +---------+------------------+-----+--------+-------+ Left     Lt Pressure (mmHg)IndexWaveformComment +---------+------------------+-----+--------+-------+ Brachial 133                                    +---------+------------------+-----+--------+-------+ PTA      118               0.89                 +---------+------------------+-----+--------+-------+ DP       109                0.82                 +---------+------------------+-----+--------+-------+ Great Toe95                0.71                 +---------+------------------+-----+--------+-------+  Bilateral ABIs appear essentially unchanged compared to prior study on 03/19/2022. Bilateral TBIs appear increased compared to prior study on 03/19/2022.  Summary: Right: Resting right ankle-brachial index indicates mild right lower extremity arterial disease. The right toe-brachial index is normal. Left: Resting left ankle-brachial index indicates mild left lower extremity arterial disease. The left toe-brachial index is normal. *See table(s) above for measurements and observations.  Electronically signed by Julien Nordmann MD on 09/16/2023 at 3:49:58 PM.    Final     EKG: Orders placed or performed in visit on 10/07/23   EKG 12-Lead   EKG 12-Lead   EKG 12-Lead     Hospital Course: Patient was admitted to [facility] and taken to the OR and underwent the above state procedure without complications.  Patient tolerated the procedure well and was later transferred to the recovery room and then to the orthopaedic floor for postoperative care.  They were given PO and IV analgesics for pain control following their surgery.  They were given 24 hours of postoperative antibiotics.   PT was consulted postop to assist with mobility and transfers.  The patient was allowed to be WBAT with therapy and was taught back precautions. Discharge planning was consulted to help with postop disposition and equipment needs.  Patient had a good night on the evening of surgery and started to get up OOB with therapy on day one. Patient was seen in rounds and was ready to go home on day one.  They were given discharge instructions and dressing directions.  They were instructed on when to follow up in the office with Dr. Shelle Iron.   Diet: Diabetic diet Activity:WBAT, Lspine precautions Follow-up:in 10-14 days Disposition -  Home Discharged Condition: good   Discharge Instructions     Call MD / Call 911   Complete by: As directed    If you experience chest pain or shortness of breath, CALL 911 and be transported to the hospital emergency room.  If you develope a fever above 101 F, pus (white drainage) or increased drainage or redness at the wound, or calf pain, call your surgeon's office.   Constipation Prevention   Complete by: As directed    Drink plenty of fluids.  Prune juice may be helpful.  You may use a stool softener, such as Colace (over the counter)  100 mg twice a day.  Use MiraLax (over the counter) for constipation as needed.   Diet - low sodium heart healthy   Complete by: As directed    Increase activity slowly as tolerated   Complete by: As directed    Post-operative opioid taper instructions:   Complete by: As directed    POST-OPERATIVE OPIOID TAPER INSTRUCTIONS: It is important to wean off of your opioid medication as soon as possible. If you do not need pain medication after your surgery it is ok to stop day one. Opioids include: Codeine, Hydrocodone(Norco, Vicodin), Oxycodone(Percocet, oxycontin) and hydromorphone amongst others.  Long term and even short term use of opiods can cause: Increased pain response Dependence Constipation Depression Respiratory depression And more.  Withdrawal symptoms can include Flu like symptoms Nausea, vomiting And more Techniques to manage these symptoms Hydrate well Eat regular healthy meals Stay active Use relaxation techniques(deep breathing, meditating, yoga) Do Not substitute Alcohol to help with tapering If you have been on opioids for less than two weeks and do not have pain than it is ok to stop all together.  Plan to wean off of opioids This plan should start within one week post op of your joint replacement. Maintain the same interval or time between taking each dose and first decrease the dose.  Cut the total daily intake of opioids  by one tablet each day Next start to increase the time between doses. The last dose that should be eliminated is the evening dose.         Allergies as of 10/15/2023       Reactions   Metformin And Related Diarrhea, Other (See Comments)   Severe diarrhea   Wellbutrin [bupropion] Nausea Only        Medication List     TAKE these medications    albuterol 108 (90 Base) MCG/ACT inhaler Commonly known as: VENTOLIN HFA Inhale 2 puffs into the lungs every 6 (six) hours as needed for wheezing or shortness of breath.   aspirin EC 81 MG tablet Take 1 tablet (81 mg total) by mouth daily with breakfast. No aspirin for five days postop Start taking on: October 20, 2023 What changed:  additional instructions These instructions start on October 20, 2023. If you are unsure what to do until then, ask your doctor or other care provider.   atorvastatin 80 MG tablet Commonly known as: LIPITOR Take 1 tablet (80 mg total) by mouth daily.   B-complex with vitamin C tablet Take 1 tablet by mouth daily.   Breztri Aerosphere 160-9-4.8 MCG/ACT Aero Generic drug: Budeson-Glycopyrrol-Formoterol Inhale 2 puffs into the lungs in the morning and at bedtime.   clopidogrel 75 MG tablet Commonly known as: PLAVIX Take 1 tablet (75 mg total) by mouth daily. No plavix for five days starting tomorrow Start taking on: October 20, 2023 What changed:  additional instructions These instructions start on October 20, 2023. If you are unsure what to do until then, ask your doctor or other care provider.   dapagliflozin propanediol 5 MG Tabs tablet Commonly known as: Farxiga Take 1 tablet (5 mg total) by mouth daily before breakfast.   docusate sodium 100 MG capsule Commonly known as: Colace Take 1 capsule (100 mg total) by mouth 2 (two) times daily.   ezetimibe 10 MG tablet Commonly known as: ZETIA TAKE 1 TABLET EVERY DAY (NEED MD APPOINTMENT FOR REFILLS)   fenofibrate 145 MG tablet Commonly known as:  TRICOR Take 1 tablet (145 mg total) by  mouth daily.   guaiFENesin 600 MG 12 hr tablet Commonly known as: MUCINEX Take 600 mg by mouth 2 (two) times daily as needed for cough.   HYDROmorphone 2 MG tablet Commonly known as: Dilaudid Take 1 tablet (2 mg total) by mouth every 4 (four) hours as needed for up to 5 days for severe pain (pain score 7-10).   isosorbide mononitrate 60 MG 24 hr tablet Commonly known as: IMDUR TAKE 1 AND 1/2 TABLETS EVERY DAY   methocarbamol 500 MG tablet Commonly known as: ROBAXIN Take by mouth daily as needed for muscle spasms.   nicotine 21 mg/24hr patch Commonly known as: NICODERM CQ - dosed in mg/24 hours Place 1 patch (21 mg total) onto the skin daily.   oxyCODONE-acetaminophen 10-325 MG tablet Commonly known as: PERCOCET Take 2 tablets by mouth every 6 (six) hours as needed for pain.   OXYGEN Inhale 2-4 L/min into the lungs continuous.   pantoprazole 40 MG tablet Commonly known as: PROTONIX TAKE 1 TABLET TWICE DAILY (NEED MD APPOINTMENT FOR REFILLS)   polyethylene glycol 17 g packet Commonly known as: MIRALAX / GLYCOLAX Take 17 g by mouth daily.   PRESCRIPTION MEDICATION CPAP- At bedtime   spironolactone 25 MG tablet Commonly known as: ALDACTONE TAKE 1/2 TABLET EVERY DAY   tamsulosin 0.4 MG Caps capsule Commonly known as: FLOMAX Take 1 capsule (0.4 mg total) by mouth in the morning and at bedtime.   traZODone 100 MG tablet Commonly known as: DESYREL Take 2 tablets (200 mg total) by mouth at bedtime.   valsartan 40 MG tablet Commonly known as: DIOVAN Take 1 tablet (40 mg total) by mouth daily.   Varenicline Tartrate (Starter) 0.5 MG X 11 & 1 MG X 42 Tbpk Commonly known as: Chantix Starting Month Pak Take according to starter pack directions *take with food*   Vitamin B-12 5000 MCG Subl Place 5,000 mcg under the tongue daily.   Vitamin D-3 125 MCG (5000 UT) Tabs Take 5,000 Units by mouth daily.        Follow-up  Information     Jene Every, MD Follow up in 2 week(s).   Specialty: Orthopedic Surgery Contact information: [address] [place] [address] [place] [address] [phone]                 Signed: Andrez Grime, PA-C Orthopaedic Surgery 10/15/2023, 7:56 AM

## 2023-10-15 NOTE — Evaluation (Signed)
 Occupational Therapy Evaluation Patient Details Name: [name]. MRN: [id number] DOB: 1961-06-21 Today's Date: 10/15/2023   History of Present Illness   [name] is a 63 y.o. male presenting s/p microlumbar decompression L5-S1 on 10/14/2023. PMHx includes HRpEF, AAA, abnormality of gait, anginal pain, anxiety, CAD, ETOH abuse, cocaine use, chronic diastolic CHF, GERD, Emphysema, HTN, HLD, PAD, Stroke, COPD, hemianopia homonymous (right), myocardial infarction     Clinical Impressions Pt evaluated s/p the admission list above. At baseline, pt lives at home alone, drives, completes all ADLs/IADLs and functional mobility tasks without use of DME. Upon evaluation, pt was limited by decreased activity tolerance, impaired balance, SOB with functional activity, knowledge of compensatory strategies, back precautions, and pain at incision site. After education, pt able to demonstrate figure 4 without difficulty to safely perform lower body ADLs and completed functional mobility with up to supervision without AD. Pt with unsteadiness in gait. Anticipates pt will have improved gait stability with AD. Pt with SOB with activity and educated on energy conservation including pacing and PLB for increased safety. Pt verbalized understanding. Pt O2 sats were 91% on RA after functional activity. Pt does not have acute OT needs. Recommends pt discharge home with family/friend support as needed.       If plan is discharge home, recommend the following:   Help with stairs or ramp for entrance;Assistance with cooking/housework;Assist for transportation     Functional Status Assessment   Patient has had a recent decline in their functional status and demonstrates the ability to make significant improvements in function in a reasonable and predictable amount of time.     Equipment Recommendations   None recommended by OT     Recommendations for Other Services          Precautions/Restrictions   Precautions Precautions: Back;Fall Precaution Booklet Issued: Yes (comment) Recall of Precautions/Restrictions: Intact Precaution/Restrictions Comments: Pt provided with handout of back precautions. Back precautions reviewed with pt Restrictions Weight Bearing Restrictions Per Provider Order: No     Mobility Bed Mobility Overal bed mobility: Modified Independent             General bed mobility comments: Pt educated on log roll technique to ensure pt adheres to back precautions. Pt reported using log roll technique at baseline due to pain    Transfers Overall transfer level: Needs assistance Equipment used: None Transfers: Sit to/from Stand Sit to Stand: Supervision           General transfer comment: Supervision provided for safety. No physical assistance required      Balance Overall balance assessment: Needs assistance Sitting-balance support: Feet supported, No upper extremity supported Sitting balance-Leahy Scale: Good Sitting balance - Comments: Pt demonstrated figure 4 to complete lower body dressing tasks   Standing balance support: No upper extremity supported, During functional activity Standing balance-Leahy Scale: Fair Standing balance comment: functional ambulation without use of DME. Unsteadiness in gait observed                           ADL either performed or assessed with clinical judgement   ADL Overall ADL's : Needs assistance/impaired Eating/Feeding: Independent   Grooming: Wash/dry hands;Wash/dry face;Oral care;Standing;Independent   Upper Body Bathing: Modified independent;Sitting   Lower Body Bathing: Modified independent;Sit to/from stand Lower Body Bathing Details (indicate cue type and reason): using compensatory strategies Upper Body Dressing : Independent;Sitting   Lower Body Dressing: Sit to/from stand;Modified independent Lower Body Dressing  Details (indicate cue type and reason):  using compensatory strategies Toilet Transfer: Supervision/safety;Ambulation;Regular Toilet   Toileting- Architect and Hygiene: Sit to/from stand;Modified independent       Functional mobility during ADLs: Supervision/safety;Cueing for safety General ADL Comments: Pt with slight unsteadiness in gait when performing functional mobility without use of AD. Verbal cues provided for pacing. Pt with SOB on RA. SpO2 was 91% after functional activity. Pt educated on energy conservation.     Vision Ability to See in Adequate Light: 0 Adequate Patient Visual Report: Other (comment);No change from baseline (hemianopia homonymous (right))       Perception Perception: Not tested       Praxis Praxis: Not tested       Pertinent Vitals/Pain Pain Assessment Pain Assessment: Faces Faces Pain Scale: Hurts even more Pain Location: incision site Pain Descriptors / Indicators: Discomfort Pain Intervention(s): Limited activity within patient's tolerance     Extremity/Trunk Assessment Upper Extremity Assessment Upper Extremity Assessment: Defer to OT evaluation   Lower Extremity Assessment Lower Extremity Assessment: Generalized weakness;RLE deficits/detail RLE Deficits / Details: Residual deficits from prior CVA   Cervical / Trunk Assessment Cervical / Trunk Assessment: Back Surgery   Communication Communication Communication: No apparent difficulties   Cognition Arousal: Alert Behavior During Therapy: WFL for tasks assessed/performed Cognition: No apparent impairments             OT - Cognition Comments: Pt answered questions appropriately, followed verbal commands                 Following commands: Intact       Cueing  General Comments   Cueing Techniques: Verbal cues  VSS on RA; O2- 91%   Exercises     Shoulder Instructions      Home Living Family/patient expects to be discharged to:: Private residence Living Arrangements: Alone Available  Help at Discharge: Friend(s);Available PRN/intermittently Type of Home: House Home Access: Stairs to enter [employer] of Steps: 4 Entrance Stairs-Rails: Right;Left Home Layout: One level     Bathroom Shower/Tub: Chief Strategy Officer: Standard     Home Equipment: Agricultural consultant (2 wheels);Crutches;Shower seat;Grab bars - tub/shower   Additional Comments: Pt requesting SPC for balance      Prior Functioning/Environment Prior Level of Function : Independent/Modified Independent;Driving             Mobility Comments: Pt reports not using AD at baseline but furniture walks within home for balance ADLs Comments: completed all ADLs/IADLs independently    OT Problem List: Decreased strength;Decreased activity tolerance;Impaired balance (sitting and/or standing);Decreased knowledge of use of DME or AE;Decreased knowledge of precautions   OT Treatment/Interventions:        OT Goals(Current goals can be found in the care plan section)   Acute Rehab OT Goals Patient Stated Goal: home OT Goal Formulation: With patient Time For Goal Achievement: 10/15/23 Potential to Achieve Goals: Good   OT Frequency:       Co-evaluation              AM-PAC OT "6 Clicks" Daily Activity     Outcome Measure Help from another person eating meals?: None Help from another person taking care of personal grooming?: None Help from another person toileting, which includes using toliet, bedpan, or urinal?: A Little Help from another person bathing (including washing, rinsing, drying)?: None Help from another person to put on and taking off regular upper body clothing?: None Help from another person to put on  and taking off regular lower body clothing?: None 6 Click Score: 23   End of Session Equipment Utilized During Treatment: Gait belt Nurse Communication: Mobility status  Activity Tolerance: Patient tolerated treatment well Patient left: in chair;with call  bell/phone within reach  OT Visit Diagnosis: Unsteadiness on feet (R26.81);Other abnormalities of gait and mobility (R26.89);Muscle weakness (generalized) (M62.81)                Time: 1610-9604 OT Time Calculation (min): 19 min Charges:  OT General Charges $OT Visit: 1 Visit OT Evaluation $OT Eval Low Complexity: 1 Low  [address], [place]  Quinterious Walraven 10/15/2023, 11:46 AM

## 2023-10-15 NOTE — Progress Notes (Signed)
 Patient alert and oriented, void, ambulate. Surgical clean and dry no sign infection. D/c instructions explain and given to the patient and family all questions answered.

## 2023-10-15 NOTE — Progress Notes (Signed)
 Subjective: 1 Day Post-Op Procedure(s) (LRB): Microlumbar decompression L5-S1 (N/A) Patient reports pain as mild. Incisional pain. Leg improved. Has been up and walking and tolerating well. No N/V. Voiding without difficulty.   Objective: Vital signs in last 24 hours: Temp:  [97.5 F (36.4 C)-98.9 F (37.2 C)] 98.7 F (37.1 C) (03/07 0721) Pulse Rate:  [74-108] 74 (03/07 0721) Resp:  [12-20] 20 (03/07 0721) BP: (126-169)/(69-96) 136/76 (03/07 0721) SpO2:  [90 %-100 %] 93 % (03/07 0721)  Intake/Output from previous day: 03/06 0701 - 03/07 0700 In: 2729.9 [P.O.:960; I.V.:1319.9; IV Piggyback:450] Out: 360 [Urine:350; Blood:10] Intake/Output this shift: No intake/output data recorded.  No results for input(s): "HGB" in the last 72 hours. No results for input(s): "WBC", "RBC", "HCT", "PLT" in the last 72 hours. No results for input(s): "NA", "K", "CL", "CO2", "BUN", "CREATININE", "GLUCOSE", "CALCIUM" in the last 72 hours. No results for input(s): "LABPT", "INR" in the last 72 hours.  Neurologically intact ABD soft Neurovascular intact Sensation intact distally Intact pulses distally Dorsiflexion/Plantar flexion intact Incision: dressing C/D/I and no drainage No cellulitis present Compartment soft No sign of DVT   Assessment/Plan: 1 Day Post-Op Procedure(s) (LRB): Microlumbar decompression L5-S1 (N/A) Advance diet Up with therapy D/C IV fluids Discussed D/C and dressing instructions, LSpine precautions D/C to home today Discussed w Dr Tenna Delaine Doralee Albino 10/15/2023, 7:55 AM

## 2023-11-03 ENCOUNTER — Other Ambulatory Visit: Payer: Self-pay | Admitting: Internal Medicine

## 2023-11-03 ENCOUNTER — Telehealth: Payer: Self-pay | Admitting: Pharmacist

## 2023-11-03 ENCOUNTER — Telehealth: Payer: Medicare HMO

## 2023-11-03 NOTE — Telephone Encounter (Signed)
 Attempt was made to contact patient by phone today for follow up by Clinical Pharmacist regarding medication management and CHF.[name]  Unable to reach patient. LM on VM with my contact number [phone].

## 2023-11-24 ENCOUNTER — Encounter: Payer: Self-pay | Admitting: *Deleted

## 2023-11-24 ENCOUNTER — Other Ambulatory Visit: Payer: Self-pay | Admitting: Internal Medicine

## 2023-12-02 ENCOUNTER — Encounter: Payer: Self-pay | Admitting: Internal Medicine

## 2023-12-13 DIAGNOSIS — M5459 Other low back pain: Secondary | ICD-10-CM | POA: Diagnosis not present

## 2023-12-13 DIAGNOSIS — Z9889 Other specified postprocedural states: Secondary | ICD-10-CM | POA: Diagnosis not present

## 2023-12-13 DIAGNOSIS — F1090 Alcohol use, unspecified, uncomplicated: Secondary | ICD-10-CM | POA: Diagnosis not present

## 2023-12-13 DIAGNOSIS — M5416 Radiculopathy, lumbar region: Secondary | ICD-10-CM | POA: Diagnosis not present

## 2024-01-03 ENCOUNTER — Other Ambulatory Visit: Payer: Self-pay | Admitting: Cardiovascular Disease

## 2024-01-07 ENCOUNTER — Encounter: Payer: Self-pay | Admitting: Internal Medicine

## 2024-01-07 ENCOUNTER — Ambulatory Visit: Admitting: Internal Medicine

## 2024-01-07 VITALS — BP 126/60 | HR 59 | Temp 98.1°F | Resp 20 | Ht 71.0 in | Wt 213.5 lb

## 2024-01-07 DIAGNOSIS — I2510 Atherosclerotic heart disease of native coronary artery without angina pectoris: Secondary | ICD-10-CM

## 2024-01-07 DIAGNOSIS — E785 Hyperlipidemia, unspecified: Secondary | ICD-10-CM | POA: Diagnosis not present

## 2024-01-07 DIAGNOSIS — R0609 Other forms of dyspnea: Secondary | ICD-10-CM

## 2024-01-07 DIAGNOSIS — J449 Chronic obstructive pulmonary disease, unspecified: Secondary | ICD-10-CM | POA: Diagnosis not present

## 2024-01-07 DIAGNOSIS — Z23 Encounter for immunization: Secondary | ICD-10-CM | POA: Diagnosis not present

## 2024-01-07 DIAGNOSIS — I5032 Chronic diastolic (congestive) heart failure: Secondary | ICD-10-CM

## 2024-01-07 DIAGNOSIS — F17210 Nicotine dependence, cigarettes, uncomplicated: Secondary | ICD-10-CM

## 2024-01-07 DIAGNOSIS — H547 Unspecified visual loss: Secondary | ICD-10-CM

## 2024-01-07 DIAGNOSIS — E119 Type 2 diabetes mellitus without complications: Secondary | ICD-10-CM

## 2024-01-07 DIAGNOSIS — E1169 Type 2 diabetes mellitus with other specified complication: Secondary | ICD-10-CM

## 2024-01-07 MED ORDER — ALBUTEROL SULFATE HFA 108 (90 BASE) MCG/ACT IN AERS: 2.0000 | INHALATION_SPRAY | Freq: Four times a day (QID) | RESPIRATORY_TRACT | 1 refills | Status: DC | PRN

## 2024-01-07 MED ORDER — VALSARTAN 40 MG PO TABS: 40.0000 mg | ORAL_TABLET | Freq: Every day | ORAL | 1 refills | Status: DC

## 2024-01-07 MED ORDER — TRAZODONE HCL 100 MG PO TABS: 200.0000 mg | ORAL_TABLET | Freq: Every day | ORAL | 1 refills | Status: DC

## 2024-01-07 MED ORDER — TRAZODONE HCL 100 MG PO TABS: 200.0000 mg | ORAL_TABLET | Freq: Every day | ORAL | 0 refills | Status: DC

## 2024-01-07 MED ORDER — TAMSULOSIN HCL 0.4 MG PO CAPS: 0.4000 mg | ORAL_CAPSULE | Freq: Two times a day (BID) | ORAL | 1 refills | Status: DC

## 2024-01-07 MED ORDER — FENOFIBRATE 145 MG PO TABS: 145.0000 mg | ORAL_TABLET | Freq: Every day | ORAL | 3 refills | Status: DC

## 2024-01-07 MED ORDER — PANTOPRAZOLE SODIUM 40 MG PO TBEC: 40.0000 mg | DELAYED_RELEASE_TABLET | Freq: Two times a day (BID) | ORAL | 1 refills | Status: DC

## 2024-01-07 MED ORDER — EZETIMIBE 10 MG PO TABS: 10.0000 mg | ORAL_TABLET | Freq: Every day | ORAL | 1 refills | Status: DC

## 2024-01-07 NOTE — Progress Notes (Signed)
 Subjective:    Patient ID: [name]., male    DOB: [birth year], 63 y.o.   MRN: [id number]  DOS:  01/07/2024 Type of visit - description: Follow-up  Here for follow-up. Since the last visit, had surgery for back pain. Reports pain is not much better, still having difficulty with his gait.  Denies substernal chest pain.  DOE not better. Denies hemoptysis Denies lower extremity edema   Review of Systems See above   Past Medical History:  Diagnosis Date   (HFpEF) heart failure with preserved ejection fraction (HCC)    Echo 6/22: EF 55-60, no RWMA, GR 3 DD, elevated LVEDP, normal RVSF, RVSP 53.2, mild to moderate AV sclerosis without stenosis, dilated aortic root (41 mm), ascending aorta 37 mm   AAA (abdominal aortic aneurysm) (HCC)    Abnormality of gait    Anginal pain (HCC)    Anxiety    CAD (coronary artery disease)    a. 10/2008 Inferolateral STEMI: 3VD w/ occluded LCX (PTCA)-->CABG x 3 (LIMA->LAD, VG->OM, VG->PDA);  b. 06/2009 NSTEMI/PCI: VG->OM 100 (BMS);  c. 05/2013 native 3VD, 3/3 patent grafts. d. 06/2016: Inferior STEMI w/ occluded SVG-OM (DES placed). LIMA-LAD and SVG-PDA patent.   Chronic bronchitis (HCC)    Chronic diastolic CHF (congestive heart failure) (HCC)    a. 02/2015 Echo: EF 50-55%, distal septal, apical, inferobasal HK, mild LVH, mild MR, mildly dil LA.   Cocaine use    Colon polyps    a. 09/2015 colonscopy: multiple sessile polyps, path negative for high grade dysplasia.   COPD (chronic obstructive pulmonary disease) (HCC)    Depression    Emphysema (subcutaneous) (surgical) resulting from a procedure 10/2008   Bleb resected at time of 10/2008 CABG. Marketed emphysema noted on surgical report.   Esophagitis    a. 2017 EGD: esophagitis, duodenitis.   ETOH abuse    GERD (gastroesophageal reflux disease)    Hemianopia, homonymous, right    "can't see out the right side of either eye since stroke in 2016/pt description 09/23/2016   Hepatic steatosis     Hiatal hernia    History of nuclear stress test 08/2017   Nuclear stress test 1/19: EF 35, inf-lat and ant-lat, apical sept/apical scar; no ischemia; Intermediate Risk   Hyperlipidemia    Hypertension    Iron deficiency anemia 2010   Morbid obesity (HCC)    Myocardial infarction [facility]) "several"   PAD (peripheral artery disease) (HCC)    Pancreatitis 06/2015   Sleep apnea    "don't have a mask" (09/23/2016)   Stroke (HCC) 02/2015   a. 02/2015 Carotid U/S: 1-39% bilat ICA stenosis; "left me blind" (09/23/2016)   Tobacco abuse    still smokes   Tubular adenoma of colon     Past Surgical History:  Procedure Laterality Date   ABDOMINAL AORTOGRAM W/LOWER EXTREMITY N/A 09/23/2016   Procedure: Abdominal Aortogram w/Lower Extremity;  Surgeon: Wenona Hamilton, MD;  Location: [facility];  Service: Cardiovascular;  Laterality: N/A;   ABDOMINAL AORTOGRAM W/LOWER EXTREMITY N/A 02/18/2022   Procedure: ABDOMINAL AORTOGRAM W/LOWER EXTREMITY;  Surgeon: Wenona Hamilton, MD;  Location: [facility];  Service: Cardiovascular;  Laterality: N/A;   CARDIAC CATHETERIZATION  11/07/2008   EF of 50-55% -- normal LV systolic function, acute inferolateral ST elevation MI,  PTCA of the circumflex artery -- Chauncy Coral. Ollis Bi, [name]   CARDIAC CATHETERIZATION N/A 07/07/2016   Procedure: Left Heart Cath and Cors/Grafts Angiography;  Surgeon: Peter M Swaziland,  MD;  Location: [facility];  Service: Cardiovascular;  Laterality: N/A;   CARDIAC CATHETERIZATION N/A 07/07/2016   Procedure: Coronary Stent Intervention;  Surgeon: Peter M Swaziland, MD;  Location: [facility] INVASIVE CV LAB;  Service: Cardiovascular;  Laterality: N/A;   COLONOSCOPY     CORONARY ANGIOPLASTY     CORONARY ARTERY BYPASS GRAFT     "CABG X4"   LEFT HEART CATH AND CORS/GRAFTS ANGIOGRAPHY N/A 10/01/2017   Procedure: LEFT HEART CATH AND CORS/GRAFTS ANGIOGRAPHY;  Surgeon: Odie Benne, MD;  Location: [facility];  Service:  Cardiovascular;  Laterality: N/A;   LEFT HEART CATHETERIZATION WITH CORONARY/GRAFT ANGIOGRAM N/A 05/15/2013   Procedure: LEFT HEART CATHETERIZATION WITH Estella Helling;  Surgeon: Peter M Swaziland, MD;  Location: [facility] CATH LAB;  Service: Cardiovascular;  Laterality: N/A;   LUMBAR LAMINECTOMY/DECOMPRESSION MICRODISCECTOMY N/A 10/14/2023   Procedure: Microlumbar decompression L5-S1;  Surgeon: Orvan Blanch, MD;  Location: [facility] OR;  Service: Orthopedics;  Laterality: N/A;  3 C-Bed   PERIPHERAL VASCULAR ATHERECTOMY  02/18/2022   Procedure: PERIPHERAL VASCULAR ATHERECTOMY;  Surgeon: Wenona Hamilton, MD;  Location: [facility];  Service: Cardiovascular;;   PERIPHERAL VASCULAR INTERVENTION  09/23/2016   Procedure: Peripheral Vascular Intervention;  Surgeon: Wenona Hamilton, MD;  Location: [facility];  Service: Cardiovascular;;  Lt. SFA    Current Outpatient Medications  Medication Instructions   albuterol  (VENTOLIN  HFA) 108 (90 Base) MCG/ACT inhaler 2 puffs, Inhalation, Every 6 hours PRN   aspirin  EC 81 mg, Oral, Daily with breakfast, No aspirin  for five days postop   atorvastatin  (LIPITOR ) 80 mg, Oral, Daily   B Complex-C (B-COMPLEX WITH VITAMIN C) tablet 1 tablet, Daily   Budeson-Glycopyrrol-Formoterol  (BREZTRI  AEROSPHERE) 160-9-4.8 MCG/ACT AERO 2 puffs, Inhalation, 2 times daily   clopidogrel  (PLAVIX ) 75 mg, Oral, Daily, No plavix  for five days starting tomorrow   dapagliflozin  propanediol (FARXIGA ) 5 mg, Oral, Daily before breakfast   docusate sodium  (COLACE) 100 mg, Oral, 2 times daily   ezetimibe  (ZETIA ) 10 MG tablet TAKE 1 TABLET EVERY DAY (NEED MD APPOINTMENT FOR REFILLS)   fenofibrate  (TRICOR ) 145 mg, Oral, Daily   guaiFENesin  (MUCINEX ) 600 mg, 2 times daily PRN   isosorbide  mononitrate (IMDUR ) 60 MG 24 hr tablet TAKE 1 AND 1/2 TABLETS EVERY DAY   methocarbamol  (ROBAXIN ) 500 MG tablet Daily PRN   nicotine  (NICODERM CQ  - DOSED IN MG/24 HOURS) 21 mg, Transdermal, Daily    oxyCODONE -acetaminophen  (PERCOCET) 10-325 MG tablet 2 tablets, Every 6 hours PRN   OXYGEN  2-4 L/min, Continuous   pantoprazole  (PROTONIX ) 40 MG tablet TAKE 1 TABLET TWICE DAILY (NEED MD APPOINTMENT FOR REFILLS)   polyethylene glycol (MIRALAX  / GLYCOLAX ) 17 g, Oral, Daily   PRESCRIPTION MEDICATION CPAP- At bedtime   spironolactone  (ALDACTONE ) 12.5 mg, Oral, Daily   tamsulosin  (FLOMAX ) 0.4 mg, Oral, 2 times daily   traZODone  (DESYREL ) 200 mg, Oral, Daily at bedtime   valsartan  (DIOVAN ) 40 mg, Oral, Daily   Varenicline  Tartrate, Starter, (CHANTIX  STARTING MONTH PAK) 0.5 MG X 11 & 1 MG X 42 TBPK Take according to starter pack directions *take with food*   Vitamin B-12 5,000 mcg, Daily   Vitamin D -3 5,000 Units, Daily       Objective:   Physical Exam BP 126/60   Pulse (!) 59   Temp 98.1 F (36.7 C) (Oral)   Resp 20   Ht 5\' 11"  (1.803 m)   Wt 213 lb 8 oz (96.8 kg)   SpO2  91% Comment: Room air  BMI 29.78 kg/m  General:   Well developed, NAD, BMI noted. HEENT:  Normocephalic . Face symmetric, atraumatic Lungs:  Ronchi  throughout, increased expiratory time. Normal respiratory effort, no intercostal retractions, no accessory muscle use. Heart: RRR, + systolic murmur.  Lower extremities: no pretibial edema bilaterally  Skin: Not pale. Not jaundice Neurologic:  alert & oriented X3.  Speech normal, gait, is limited, does not have a cane with him. Psych--  Cognition and judgment appear intact.  Cooperative with normal attention span and concentration.  Behavior appropriate. No anxious or depressed appearing.      Assessment     ASSESSMENT DM HTN Dyslipidemia CV: --CAD: 2010 angioplasty f/u by  CABG 2010, cath 2014 -- CP, cath 09/2017, patent grafts, medical treatment --PVD --Stroke 2008, 02-2015, 2020 -CHF: Heart failure with preserved EF Anxiety depression insomnia Pulm: ---OSA: Sleep study 01/2017, ++ OSA, cpap intolerant --- hypoxemia (nocturnal O2) ---COPD:  Clinical diagnosis and CT findings, has not pursued PFTs Abnormal gait GI: Iron def anemia dx 03-2015  ---cscope 09-2015 multiple polyps, repeat in one year ---EGD-2017: Esophagitis, duodenitis, bx done Pancreatitis 06/2015.(EtOH 2 days prior to onset of symptoms) Etoh abuse H/o cocaine  Smoker  +FH: father prostate ca , dx late 60s Back pain:saw Ortho 01/2020, SXS felt to be mechanical due to: JD, deconditioning, residual weakness from the stroke.  PVD likely to be contributing. Vitamin D  deficiency    PLAN MSK: 10/14/2023: L5-S1 microlumbar decompression.  Reports pain and gait are about the same. DM, on Farxiga , checking A1c. HTN: BP today looks good, reports checks ambulatory BPs occasionally, they are normal, could not give me any readings. High cholesterol: On atorvastatin , refill Zetia  COPD, asthma, OSA: He is here today without oxygen , complaining of DOE.  Encouraged to use oxygen  24/7.  A prescription was sent for a concentrator before. Saw pulmonary October 2024, chest x-ray no acute. CAD, CHF: Reports no substernal chest pain, DOE at baseline.  On aspirin , Plavix , Farxiga , Imdur , Aldactone , valsartan .  Check BMP and CBC.  Due to see cardiology. Depression anxiety insomnia: On trazodone  for insomnia, RF sent. Tobacco, EtOH: Still smoking.  Has nicotine  patches at home but not using them.  Did not tolerate Chantix .  Encouraged to quit. Skin lesion: See LOV, saw dermatology, lesion removed, no cancer. Compliance:  Saw our clinical pharmacist 08/2023. Things were stable at the time.    Recommended  to go over his medication list and comply w/ all medicines.  See AVS Preventive care: PNM 20 today RTC 4 months

## 2024-01-07 NOTE — Assessment & Plan Note (Signed)
 MSK: 10/14/2023: L5-S1 microlumbar decompression.  Reports pain and gait are about the same. DM, on Farxiga , checking A1c. HTN: BP today looks good, reports checks ambulatory BPs occasionally, they are normal, could not give me any readings. High cholesterol: On atorvastatin , refill Zetia  COPD, asthma, OSA: He is here today without oxygen , complaining of DOE.  Encouraged to use oxygen  24/7.  A prescription was sent for a concentrator before. Saw pulmonary October 2024, chest x-ray no acute, was recommended history. CAD, CHF: Reports no substernal chest pain, DOE at baseline.  On aspirin , Plavix , Farxiga , Imdur , Aldactone , valsartan .  Check BMP and CBC.  Due to see cardiology. Depression anxiety insomnia: On trazodone  for insomnia, RF sent. Tobacco, EtOH: Still smoking.  Has nicotine  patches at home but not using them.  Did not tolerate Chantix .  Encouraged to quit. Skin lesion: See LOV, saw dermatology, lesion removed, no cancer. Compliance:  Saw our clinical pharmacist 08/2023. Things were stable at the time.    Recommend to go over his medication list and comply w/ all medicines.  See AVS Preventive care: PNM 20 today RTC 4 months

## 2024-01-07 NOTE — Patient Instructions (Addendum)
 Please see the medication list provided today. Do not run out of your medicines. If  you need a refill, call the pharmacy or our office.  Again do not run out of your medications.  Recommend  to use your oxygen  24/7  Use your cane consistently    GO TO THE LAB :  Get the blood work   Your results will be posted on MyChart with my comments  Next office visit for a checkup in 4 months.  Please make an appointment before you leave today

## 2024-01-08 LAB — MICROALBUMIN / CREATININE URINE RATIO
Creatinine, Urine: 83 mg/dL (ref 20–320)
Microalb Creat Ratio: 5 mg/g{creat} (ref <30)
Microalb, Ur: 0.4 mg/dL

## 2024-01-08 LAB — CBC WITH DIFFERENTIAL/PLATELET
Absolute Lymphocytes: 974 {cells}/uL (ref 850–3900)
Absolute Monocytes: 771 {cells}/uL (ref 200–950)
Basophils Absolute: 29 {cells}/uL (ref 0–200)
Basophils Relative: 0.5 %
Eosinophils Absolute: 58 {cells}/uL (ref 15–500)
Eosinophils Relative: 1 %
HCT: 46.4 % (ref 38.5–50.0)
Hemoglobin: 14.7 g/dL (ref 13.2–17.1)
MCH: 27.7 pg (ref 27.0–33.0)
MCHC: 31.7 g/dL — ABNORMAL LOW (ref 32.0–36.0)
MCV: 87.4 fL (ref 80.0–100.0)
MPV: 10.8 fL (ref 7.5–12.5)
Monocytes Relative: 13.3 %
Neutro Abs: 3967 {cells}/uL (ref 1500–7800)
Neutrophils Relative %: 68.4 %
Platelets: 198 10*3/uL (ref 140–400)
RBC: 5.31 10*6/uL (ref 4.20–5.80)
RDW: 16.6 % — ABNORMAL HIGH (ref 11.0–15.0)
Total Lymphocyte: 16.8 %
WBC: 5.8 10*3/uL (ref 3.8–10.8)

## 2024-01-08 LAB — BASIC METABOLIC PANEL WITH GFR
BUN: 17 mg/dL (ref 7–25)
CO2: 25 mmol/L (ref 20–32)
Calcium: 9.4 mg/dL (ref 8.6–10.3)
Chloride: 99 mmol/L (ref 98–110)
Creat: 1.01 mg/dL (ref 0.70–1.35)
Glucose, Bld: 94 mg/dL (ref 65–99)
Potassium: 4.5 mmol/L (ref 3.5–5.3)
Sodium: 134 mmol/L — ABNORMAL LOW (ref 135–146)
eGFR: 85 mL/min/{1.73_m2} (ref >=60)

## 2024-01-08 LAB — HEMOGLOBIN A1C
Hgb A1c MFr Bld: 6.4 % — ABNORMAL HIGH (ref <5.7)
Mean Plasma Glucose: 137 mg/dL
eAG (mmol/L): 7.6 mmol/L

## 2024-01-10 ENCOUNTER — Ambulatory Visit: Payer: Self-pay | Admitting: Internal Medicine

## 2024-01-12 ENCOUNTER — Ambulatory Visit (INDEPENDENT_AMBULATORY_CARE_PROVIDER_SITE_OTHER)

## 2024-01-12 VITALS — BP 126/60 | Ht 71.0 in | Wt 214.0 lb

## 2024-01-12 DIAGNOSIS — Z2821 Immunization not carried out because of patient refusal: Secondary | ICD-10-CM | POA: Diagnosis not present

## 2024-01-12 DIAGNOSIS — Z0000 Encounter for general adult medical examination without abnormal findings: Secondary | ICD-10-CM | POA: Diagnosis not present

## 2024-01-12 NOTE — Progress Notes (Signed)
 Because this visit was a virtual/telehealth visit,  certain criteria was not obtained, such a blood pressure, CBG if applicable, and timed get up and go. Any medications not marked as "taking" were not mentioned during the medication reconciliation part of the visit. Any vitals not documented were not able to be obtained due to this being a telehealth visit or patient was unable to self-report a recent blood pressure reading due to a lack of equipment at home via telehealth. Vitals that have been documented are verbally provided by the patient.   This visit was performed by a medical professional under my direct supervision. I was immediately available for consultation/collaboration. I have reviewed and agree with the Annual Wellness Visit documentation.  Subjective:   Kenneth L Archibald Jr. is a 63 y.o. who presents for a Medicare Wellness preventive visit.  As a reminder, Annual Wellness Visits don't include a physical exam, and some assessments may be limited, especially if this visit is performed virtually. We may recommend an in-person follow-up visit with your provider if needed.  Visit Complete: Virtual I connected with  Kenneth L Dines Jr. on 01/12/24 by a audio enabled telemedicine application and verified that I am speaking with the correct person using two identifiers.  Patient Location: Home  Provider Location: Home Office  I discussed the limitations of evaluation and management by telemedicine. The patient expressed understanding and agreed to proceed.  Vital Signs: Because this visit was a virtual/telehealth visit, some criteria may be missing or patient reported. Any vitals not documented were not able to be obtained and vitals that have been documented are patient reported.  VideoDeclined- This patient declined Librarian, academic. Therefore the visit was completed with audio only.  Persons Participating in Visit: Patient.  AWV Questionnaire: No:  Patient Medicare AWV questionnaire was not completed prior to this visit.  Cardiac Risk Factors include: advanced age (>49men, >88 women);hypertension;diabetes mellitus;dyslipidemia;male gender;Other (see comment), Risk factor comments: chf, copd     Objective:     Today's Vitals   01/12/24 1516 01/12/24 1517  BP: 126/60   Weight: 214 lb (97.1 kg)   Height: 5\' 11"  (1.803 m)   PainSc:  7    Body mass index is 29.85 kg/m.     01/12/2024    3:22 PM 10/07/2023    2:22 PM 02/18/2022    9:12 AM 02/11/2022   11:44 AM 02/04/2022    4:29 PM 12/23/2021    6:27 PM 12/23/2021    4:12 PM  Advanced Directives  Does Patient Have a Medical Advance Directive? No No Yes Yes No  No  Type of Advance Directive    Living will     Would patient like information on creating a medical advance directive? No - Patient declined Yes (MAU/Ambulatory/Procedural Areas - Information given)   Yes (MAU/Ambulatory/Procedural Areas - Information given) No - Patient declined;No - Guardian declined     Current Medications (verified) Outpatient Encounter Medications as of 01/12/2024  Medication Sig   albuterol  (VENTOLIN  HFA) 108 (90 Base) MCG/ACT inhaler Inhale 2 puffs into the lungs every 6 (six) hours as needed for wheezing or shortness of breath.   aspirin  EC 81 MG tablet Take 1 tablet (81 mg total) by mouth daily with breakfast. No aspirin  for five days postop (Patient taking differently: Take 81 mg by mouth daily with breakfast.)   atorvastatin  (LIPITOR ) 80 MG tablet TAKE 1 TABLET EVERY DAY   B Complex-C (B-COMPLEX WITH VITAMIN C) tablet Take 1 tablet  by mouth daily.   Budeson-Glycopyrrol-Formoterol  (BREZTRI  AEROSPHERE) 160-9-4.8 MCG/ACT AERO Inhale 2 puffs into the lungs in the morning and at bedtime.   Cholecalciferol  (VITAMIN D -3) 125 MCG (5000 UT) TABS Take 5,000 Units by mouth daily.   clopidogrel  (PLAVIX ) 75 MG tablet Take 1 tablet (75 mg total) by mouth daily. No plavix  for five days starting tomorrow (Patient  taking differently: Take 75 mg by mouth daily.)   Cyanocobalamin  (VITAMIN B-12) 5000 MCG SUBL Place 5,000 mcg under the tongue daily.   dapagliflozin  propanediol (FARXIGA ) 5 MG TABS tablet Take 1 tablet (5 mg total) by mouth daily before breakfast.   ezetimibe  (ZETIA ) 10 MG tablet Take 1 tablet (10 mg total) by mouth daily.   fenofibrate  (TRICOR ) 145 MG tablet Take 1 tablet (145 mg total) by mouth daily.   guaiFENesin  (MUCINEX ) 600 MG 12 hr tablet Take 600 mg by mouth 2 (two) times daily as needed for cough.   isosorbide  mononitrate (IMDUR ) 60 MG 24 hr tablet TAKE 1 AND 1/2 TABLETS EVERY DAY   methocarbamol  (ROBAXIN ) 500 MG tablet Take by mouth daily as needed for muscle spasms.   nicotine  (NICODERM CQ  - DOSED IN MG/24 HOURS) 21 mg/24hr patch Place 1 patch (21 mg total) onto the skin daily.   oxyCODONE -acetaminophen  (PERCOCET) 10-325 MG tablet Take 2 tablets by mouth every 6 (six) hours as needed for pain.   OXYGEN  Inhale 2-4 L/min into the lungs continuous.   pantoprazole  (PROTONIX ) 40 MG tablet Take 1 tablet (40 mg total) by mouth 2 (two) times daily before a meal.   PRESCRIPTION MEDICATION CPAP- At bedtime   spironolactone  (ALDACTONE ) 25 MG tablet TAKE 1/2 TABLET EVERY DAY   tamsulosin  (FLOMAX ) 0.4 MG CAPS capsule Take 1 capsule (0.4 mg total) by mouth in the morning and at bedtime.   traZODone  (DESYREL ) 100 MG tablet Take 2 tablets (200 mg total) by mouth at bedtime.   valsartan  (DIOVAN ) 40 MG tablet Take 1 tablet (40 mg total) by mouth daily.   No facility-administered encounter medications on file as of 01/12/2024.    Allergies (verified) Metformin  and related and Wellbutrin  [bupropion ]   History: Past Medical History:  Diagnosis Date   (HFpEF) heart failure with preserved ejection fraction (HCC)    Echo 6/22: EF 55-60, no RWMA, GR 3 DD, elevated LVEDP, normal RVSF, RVSP 53.2, mild to moderate AV sclerosis without stenosis, dilated aortic root (41 mm), ascending aorta 37 mm   AAA  (abdominal aortic aneurysm) (HCC)    Abnormality of gait    Anginal pain (HCC)    Anxiety    CAD (coronary artery disease)    a. 10/2008 Inferolateral STEMI: 3VD w/ occluded LCX (PTCA)-->CABG x 3 (LIMA->LAD, VG->OM, VG->PDA);  b. 06/2009 NSTEMI/PCI: VG->OM 100 (BMS);  c. 05/2013 native 3VD, 3/3 patent grafts. d. 06/2016: Inferior STEMI w/ occluded SVG-OM (DES placed). LIMA-LAD and SVG-PDA patent.   Chronic bronchitis (HCC)    Chronic diastolic CHF (congestive heart failure) (HCC)    a. 02/2015 Echo: EF 50-55%, distal septal, apical, inferobasal HK, mild LVH, mild MR, mildly dil LA.   Cocaine use    Colon polyps    a. 09/2015 colonscopy: multiple sessile polyps, path negative for high grade dysplasia.   COPD (chronic obstructive pulmonary disease) (HCC)    Depression    Emphysema (subcutaneous) (surgical) resulting from a procedure 10/2008   Bleb resected at time of 10/2008 CABG. Marketed emphysema noted on surgical report.   Esophagitis    a. 2017 EGD:  esophagitis, duodenitis.   ETOH abuse    GERD (gastroesophageal reflux disease)    Hemianopia, homonymous, right    "can't see out the right side of either eye since stroke in 2016/pt description 09/23/2016   Hepatic steatosis    Hiatal hernia    History of nuclear stress test 08/2017   Nuclear stress test 1/19: EF 35, inf-lat and ant-lat, apical sept/apical scar; no ischemia; Intermediate Risk   Hyperlipidemia    Hypertension    Iron deficiency anemia 2010   Morbid obesity (HCC)    Myocardial infarction [facility]) "several"   PAD (peripheral artery disease) (HCC)    Pancreatitis 06/2015   Sleep apnea    "don't have a mask" (09/23/2016)   Stroke (HCC) 02/2015   a. 02/2015 Carotid U/S: 1-39% bilat ICA stenosis; "left me blind" (09/23/2016)   Tobacco abuse    still smokes   Tubular adenoma of colon    Past Surgical History:  Procedure Laterality Date   ABDOMINAL AORTOGRAM W/LOWER EXTREMITY N/A 09/23/2016   Procedure: Abdominal Aortogram  w/Lower Extremity;  Surgeon: Wenona Hamilton, MD;  Location: [facility] INVASIVE CV LAB;  Service: Cardiovascular;  Laterality: N/A;   ABDOMINAL AORTOGRAM W/LOWER EXTREMITY N/A 02/18/2022   Procedure: ABDOMINAL AORTOGRAM W/LOWER EXTREMITY;  Surgeon: Wenona Hamilton, MD;  Location: [facility] INVASIVE CV LAB;  Service: Cardiovascular;  Laterality: N/A;   CARDIAC CATHETERIZATION  11/07/2008   EF of 50-55% -- normal LV systolic function, acute inferolateral ST elevation MI,  PTCA of the circumflex artery -- Chauncy Coral. Ollis Bi, [name]   CARDIAC CATHETERIZATION N/A 07/07/2016   Procedure: Left Heart Cath and Cors/Grafts Angiography;  Surgeon: Peter M Swaziland, MD;  Location: [facility] INVASIVE CV LAB;  Service: Cardiovascular;  Laterality: N/A;   CARDIAC CATHETERIZATION N/A 07/07/2016   Procedure: Coronary Stent Intervention;  Surgeon: Peter M Swaziland, MD;  Location: [facility] INVASIVE CV LAB;  Service: Cardiovascular;  Laterality: N/A;   COLONOSCOPY     CORONARY ANGIOPLASTY     CORONARY ARTERY BYPASS GRAFT     "CABG X4"   LEFT HEART CATH AND CORS/GRAFTS ANGIOGRAPHY N/A 10/01/2017   Procedure: LEFT HEART CATH AND CORS/GRAFTS ANGIOGRAPHY;  Surgeon: Odie Benne, MD;  Location: [facility] INVASIVE CV LAB;  Service: Cardiovascular;  Laterality: N/A;   LEFT HEART CATHETERIZATION WITH CORONARY/GRAFT ANGIOGRAM N/A 05/15/2013   Procedure: LEFT HEART CATHETERIZATION WITH Estella Helling;  Surgeon: Peter M Swaziland, MD;  Location: [facility] CATH LAB;  Service: Cardiovascular;  Laterality: N/A;   LUMBAR LAMINECTOMY/DECOMPRESSION MICRODISCECTOMY N/A 10/14/2023   Procedure: Microlumbar decompression L5-S1;  Surgeon: Orvan Blanch, MD;  Location: [facility] OR;  Service: Orthopedics;  Laterality: N/A;  3 C-Bed   PERIPHERAL VASCULAR ATHERECTOMY  02/18/2022   Procedure: PERIPHERAL VASCULAR ATHERECTOMY;  Surgeon: Wenona Hamilton, MD;  Location: [facility] INVASIVE CV LAB;  Service: Cardiovascular;;   PERIPHERAL VASCULAR INTERVENTION  09/23/2016   Procedure:  Peripheral Vascular Intervention;  Surgeon: Wenona Hamilton, MD;  Location: [facility] INVASIVE CV LAB;  Service: Cardiovascular;;  Lt. SFA   Family History  Problem Relation Age of Onset   Heart disease Mother    Heart failure Mother    Diabetes Mother    Colon cancer Father        late 37s   Prostate cancer Neg Hx    Social History   Socioeconomic History   Marital status: Single    Spouse name: Not on file   Number of children: 2   Years of education: 12   Highest  education level: High school graduate  Occupational History   Occupation: not working , used to be a Haematologist    Occupation: disable  Tobacco Use   Smoking status: Every Day    Current packs/day: 1.50    Average packs/day: 1.5 packs/day for 49.4 years (74.1 ttl pk-yrs)    Types: Cigarettes    Start date: 1976    Passive exposure: Current   Smokeless tobacco: Never   Tobacco comments:     No longer smoking marijuana.  04/07/2022 hfb    1 ppd per pt 06-07-23  Vaping Use   Vaping status: Never Used  Substance and Sexual Activity   Alcohol use: Yes    Alcohol/week: 3.0 standard drinks of alcohol    Types: 3 Cans of beer per week    Comment: 3 beers per day   Drug use: Not Currently    Types: Marijuana    Comment: quit 5-10 years ago   Sexual activity: Not Currently  Other Topics Concern   Not on file  Social History Narrative   Lives by himself   Has a car    Sister Kathaleen Pale,  lives in [place] , she is willing to help her brother, she will be checking MyCchart messages and coordinating his appointments.  She can be reached at [phone]   Social Drivers of Corporate investment banker Strain: Low Risk  (01/12/2024)   Overall Financial Resource Strain (CARDIA)    Difficulty of Paying Living Expenses: Not hard at all  Food Insecurity: No Food Insecurity (01/12/2024)   Hunger Vital Sign    Worried About Running Out of Food in the Last Year: Never true    Ran Out of Food in the Last Year: Never true   Transportation Needs: No Transportation Needs (01/12/2024)   PRAPARE - Administrator, Civil Service (Medical): No    Lack of Transportation (Non-Medical): No  Physical Activity: Insufficiently Active (01/12/2024)   Exercise Vital Sign    Days of Exercise per Week: 3 days    Minutes of Exercise per Session: 20 min  Stress: Stress Concern Present (01/12/2024)   [employer] of Occupational Health - Occupational Stress Questionnaire    Feeling of Stress : To some extent  Social Connections: Moderately Isolated (01/12/2024)   Social Connection and Isolation Panel [NHANES]    Frequency of Communication with Friends and Family: More than three times a week    Frequency of Social Gatherings with Friends and Family: Three times a week    Attends Religious Services: More than 4 times per year    Active Member of Clubs or Organizations: No    Attends Banker Meetings: Never    Marital Status: Divorced    Tobacco Counseling Ready to quit: Not Answered Counseling given: Not Answered Tobacco comments:  No longer smoking marijuana.  04/07/2022 hfb 1 ppd per pt 06-07-23    Clinical Intake:  Pre-visit preparation completed: Yes  Pain : 0-10 Pain Score: 7  Pain Type: Chronic pain Pain Location: Leg Pain Orientation: Right Pain Descriptors / Indicators: Aching, Throbbing Pain Onset: Today Pain Frequency: Several days a week     BMI - recorded: 29.85 Nutritional Status: BMI > 30  Obese Nutritional Risks: None Diabetes: No  Lab Results  Component Value Date   HGBA1C 6.4 (H) 01/07/2024   HGBA1C 6.6 (H) 06/07/2023   HGBA1C 6.7 (H) 12/09/2022     How often do you need to have someone help  you when you read instructions, pamphlets, or other written materials from your doctor or pharmacy?: 1 - Never What is the last grade level you completed in school?: 12th grade  Interpreter Needed?: No  Information entered by :: Juliann Ochoa   Activities of  Daily Living     01/12/2024    3:20 PM 01/07/2024    1:38 PM  In your present state of health, do you have any difficulty performing the following activities:  Hearing? 0 0  Vision? 0 1  Difficulty concentrating or making decisions? 0 0  Walking or climbing stairs? 1 0  Dressing or bathing? 1 0  Doing errands, shopping? 0 0  Preparing Food and eating ? N   Using the Toilet? N   In the past six months, have you accidently leaked urine? N   Do you have problems with loss of bowel control? N   Managing your Medications? N   Managing your Finances? N   Housekeeping or managing your Housekeeping? N     Patient Care Team: Ezell Hollow, MD as PCP - General (Internal Medicine) Odie Benne, MD as PCP - Cardiology (Cardiology) Ricki Charon, MD as Consulting Physician (Ophthalmology) Bridgett Camps, Amber Bail, MD as Consulting Physician (Gastroenterology) Orvan Blanch, MD as Consulting Physician (Orthopedic Surgery) Cecilie Coffee, RPH-CPP (Pharmacist) Pauline Bos, OD as Referring Physician (Optometry)  I have updated your Care Teams any recent Medical Services you may have received from other providers in the past year.     Assessment:    This is a routine wellness examination for [place].  Hearing/Vision screen Hearing Screening - Comments:: Patient has no hearing issues  Vision Screening - Comments:: Patient has no vision difficulties    Goals Addressed               This Visit's Progress     Patient Stated (pt-stated)        To get better       Depression Screen     01/12/2024    3:23 PM 01/07/2024    1:40 PM 06/07/2023    2:09 PM 12/09/2022    2:26 PM 11/30/2022    3:14 PM 06/09/2022   11:30 AM 04/29/2022    2:36 PM  PHQ 2/9 Scores  PHQ - 2 Score 2 4 6 4 4  0 0  PHQ- 9 Score 8 13 15 13 13       Fall Risk     01/12/2024    3:22 PM 01/07/2024    1:38 PM 06/07/2023    1:37 PM 12/09/2022    2:26 PM 06/09/2022   11:30 AM  Fall Risk   Falls in the past year? 0 0  0 0 0  Number falls in past yr: 0 0 0 0 0  Injury with Fall? 0 0 0 0   Risk for fall due to : No Fall Risks    Other (Comment)  Risk for fall due to: Comment     Reports occasions of decreased activity tolerance r/t chf  Follow up Falls evaluation completed Falls evaluation completed;Education provided Falls evaluation completed Falls evaluation completed Falls prevention discussed    MEDICARE RISK AT HOME:  Medicare Risk at Home Any stairs in or around the home?: Yes If so, are there any without handrails?: No Home free of loose throw rugs in walkways, pet beds, electrical cords, etc?: Yes Adequate lighting in your home to reduce risk of falls?: Yes Life alert?: Yes Use of a cane, walker  or w/c?: No Grab bars in the bathroom?: Yes Shower chair or bench in shower?: Yes Elevated toilet seat or a handicapped toilet?: No  TIMED UP AND GO:  Was the test performed?  No  Cognitive Function: 6CIT completed        01/12/2024    3:19 PM  6CIT Screen  What Year? 0 points  What month? 0 points  What time? 0 points  Count back from 20 0 points  Months in reverse 0 points  Repeat phrase 0 points  Total Score 0 points    Immunizations Immunization History  Administered Date(s) Administered   PFIZER(Purple Top)SARS-COV-2 Vaccination 11/17/2019, 12/11/2019, 07/18/2020   PNEUMOCOCCAL CONJUGATE-20 01/07/2024   Pneumococcal Polysaccharide-23 11/03/2016   Tdap 11/03/2016    Screening Tests Health Maintenance  Topic Date Due   Zoster Vaccines- Shingrix (1 of 2) Never done   FOOT EXAM  08/05/2022   OPHTHALMOLOGY EXAM  10/25/2022   INFLUENZA VACCINE  03/10/2024   HEMOGLOBIN A1C  07/09/2024   DTaP/Tdap/Td (2 - Td or Tdap) 11/04/2026   Pneumococcal Vaccine 60-77 Years old  Completed   Hepatitis C Screening  Completed   HIV Screening  Completed   HPV VACCINES  Aged Out   Meningococcal B Vaccine  Aged Out   Colonoscopy  Discontinued   COVID-19 Vaccine  Discontinued    Health  Maintenance  Health Maintenance Due  Topic Date Due   Zoster Vaccines- Shingrix (1 of 2) Never done   FOOT EXAM  08/05/2022   OPHTHALMOLOGY EXAM  10/25/2022   Health Maintenance Items Addressed:patient states he will complete health maintenance items at a later date   Additional Screening:  Vision Screening: Recommended annual ophthalmology exams for early detection of glaucoma and other disorders of the eye. Would you like a referral to an eye doctor? No    Dental Screening: Recommended annual dental exams for proper oral hygiene  Community Resource Referral / Chronic Care Management: CRR required this visit?  no  CCM required this visit?  No   Plan:    I have personally reviewed and noted the following in the patient's chart:   Medical and social history Use of alcohol, tobacco or illicit drugs  Current medications and supplements including opioid prescriptions. Patient is not currently taking opioid prescriptions. Functional ability and status Nutritional status Physical activity Advanced directives List of other physicians Hospitalizations, surgeries, and ER visits in previous 12 months Vitals Screenings to include cognitive, depression, and falls Referrals and appointments  In addition, I have reviewed and discussed with patient certain preventive protocols, quality metrics, and best practice recommendations. A written personalized care plan for preventive services as well as general preventive health recommendations were provided to patient.   Freeda Jerry, [place]   01/12/2024   After Visit Summary: (MyChart) Due to this being a telephonic visit, the after visit summary with patients personalized plan was offered to patient via MyChart   Notes: Nothing significant to report at this time.

## 2024-01-12 NOTE — Patient Instructions (Signed)
 [name] , Thank you for taking time out of your busy schedule to complete your Annual Wellness Visit with me. I enjoyed our conversation and look forward to speaking with you again next year. I, as well as your care team,  appreciate your ongoing commitment to your health goals. Please review the following plan we discussed and let me know if I can assist you in the future. Your Game plan/ To Do List    Referrals: If you haven't heard from the office you've been referred to, please reach out to them at the phone provided.  None  Follow up Visits: Next Medicare AWV with our clinical staff: 01/17/2025   Have you seen your provider in the last 6 months (3 months if uncontrolled diabetes)? No Next Office Visit with your provider: 05/09/2024  Clinician Recommendations:  Aim for 30 minutes of exercise or brisk walking, 6-8 glasses of water, and 5 servings of fruits and vegetables each day.       This is a list of the screening recommended for you and due dates:  Health Maintenance  Topic Date Due   Zoster (Shingles) Vaccine (1 of 2) Never done   Complete foot exam   08/05/2022   Eye exam for diabetics  10/25/2022   Flu Shot  03/10/2024   Hemoglobin A1C  07/09/2024   DTaP/Tdap/Td vaccine (2 - Td or Tdap) 11/04/2026   Pneumococcal Vaccination  Completed   Hepatitis C Screening  Completed   HIV Screening  Completed   HPV Vaccine  Aged Out   Meningitis B Vaccine  Aged Out   Colon Cancer Screening  Discontinued   COVID-19 Vaccine  Discontinued    Advanced directives: (Declined) Advance directive discussed with you today. Even though you declined this today, please call our office should you change your mind, and we can give you the proper paperwork for you to fill out. Advance Care Planning is important because it:  [x]  Makes sure you receive the medical care that is consistent with your values, goals, and preferences  [x]  It provides guidance to your family and loved ones and reduces  their decisional burden about whether or not they are making the right decisions based on your wishes.  Follow the link provided in your after visit summary or read over the paperwork we have mailed to you to help you started getting your Advance Directives in place. If you need assistance in completing these, please reach out to us  so that we can help you!  See attachments for Preventive Care and Fall Prevention Tips.

## 2024-02-23 ENCOUNTER — Telehealth: Payer: Self-pay | Admitting: Pharmacist

## 2024-02-23 NOTE — Telephone Encounter (Signed)
 Patient's sister / caregiver Bari Buddy called and LM on VM of Clinical Pharmacist Practitioner. She has a questions but did not leave any specific information.  Tried to call back but no answer. LM on VM with my CB# [phone]

## 2024-02-23 NOTE — Telephone Encounter (Signed)
 Christy called back. She said that [name] is looking to transfer to the [facility] office in [facility] because it is much closer to him home. She wanted to see if there will be a pharmacist that will help him in that office as well. There should be a clinical pharmacist if it is a [facility] office.  She will let me know when he chooses a provider and I will try to help with the transition.

## 2024-03-06 ENCOUNTER — Telehealth: Payer: Self-pay

## 2024-03-10 NOTE — Telephone Encounter (Signed)
 Received call from Deputy Fields of [facility] Department.  Deputy was called to home of patient by neighbor for possible DOA. Patient was found on floor deceased, CPR not initiated.  Deputy called to confirm PCP and willingness to sign death certificate.  Will forward to DOD in PCP absence.

## 2024-03-10 NOTE — Telephone Encounter (Signed)
 Do we know date/time?

## 2024-03-10 NOTE — Telephone Encounter (Signed)
 I completed the death certificate to the best of my ability with the available info in his chart.

## 2024-03-10 DEATH — deceased

## 2024-03-13 ENCOUNTER — Other Ambulatory Visit: Payer: Self-pay | Admitting: Cardiovascular Disease

## 2024-05-09 ENCOUNTER — Ambulatory Visit: Admitting: Internal Medicine

## 2025-01-17 ENCOUNTER — Ambulatory Visit
